# Patient Record
Sex: Female | Born: 1960
Health system: Southern US, Community
[De-identification: ages and names within clinical notes are randomized; demographics above are authoritative.]

## PROBLEM LIST (undated history)

## (undated) DIAGNOSIS — R3915 Urgency of urination: Secondary | ICD-10-CM

## (undated) DIAGNOSIS — K529 Noninfective gastroenteritis and colitis, unspecified: Secondary | ICD-10-CM

## (undated) DIAGNOSIS — J45998 Other asthma: Secondary | ICD-10-CM

## (undated) DIAGNOSIS — K219 Gastro-esophageal reflux disease without esophagitis: Secondary | ICD-10-CM

## (undated) DIAGNOSIS — Z860101 Personal history of adenomatous and serrated colon polyps: Secondary | ICD-10-CM

## (undated) DIAGNOSIS — Z87442 Personal history of urinary calculi: Secondary | ICD-10-CM

## (undated) DIAGNOSIS — R112 Nausea with vomiting, unspecified: Secondary | ICD-10-CM

## (undated) DIAGNOSIS — Z9889 Other specified postprocedural states: Secondary | ICD-10-CM

## (undated) DIAGNOSIS — F329 Major depressive disorder, single episode, unspecified: Secondary | ICD-10-CM

## (undated) DIAGNOSIS — N200 Calculus of kidney: Secondary | ICD-10-CM

## (undated) DIAGNOSIS — F32A Depression, unspecified: Secondary | ICD-10-CM

## (undated) DIAGNOSIS — K8689 Other specified diseases of pancreas: Secondary | ICD-10-CM

## (undated) DIAGNOSIS — Z800 Family history of malignant neoplasm of digestive organs: Secondary | ICD-10-CM

## (undated) DIAGNOSIS — K648 Other hemorrhoids: Secondary | ICD-10-CM

## (undated) DIAGNOSIS — E876 Hypokalemia: Secondary | ICD-10-CM

## (undated) DIAGNOSIS — E785 Hyperlipidemia, unspecified: Secondary | ICD-10-CM

## (undated) DIAGNOSIS — L409 Psoriasis, unspecified: Secondary | ICD-10-CM

## (undated) DIAGNOSIS — M1990 Unspecified osteoarthritis, unspecified site: Secondary | ICD-10-CM

## (undated) DIAGNOSIS — N201 Calculus of ureter: Secondary | ICD-10-CM

## (undated) DIAGNOSIS — E538 Deficiency of other specified B group vitamins: Secondary | ICD-10-CM

## (undated) DIAGNOSIS — A080 Rotaviral enteritis: Secondary | ICD-10-CM

## (undated) DIAGNOSIS — Z8719 Personal history of other diseases of the digestive system: Secondary | ICD-10-CM

## (undated) DIAGNOSIS — D638 Anemia in other chronic diseases classified elsewhere: Secondary | ICD-10-CM

## (undated) DIAGNOSIS — Z86010 Personal history of colonic polyps: Secondary | ICD-10-CM

## (undated) DIAGNOSIS — I10 Essential (primary) hypertension: Secondary | ICD-10-CM

## (undated) DIAGNOSIS — K5090 Crohn's disease, unspecified, without complications: Secondary | ICD-10-CM

## (undated) HISTORY — DX: Deficiency of other specified B group vitamins: E53.8

## (undated) HISTORY — DX: Major depressive disorder, single episode, unspecified: F32.9

## (undated) HISTORY — PX: ANAL FISSURE REPAIR: SHX2312

## (undated) HISTORY — DX: Crohn's disease, unspecified, without complications: K50.90

## (undated) HISTORY — DX: Family history of malignant neoplasm of digestive organs: Z80.0

## (undated) HISTORY — DX: Gastro-esophageal reflux disease without esophagitis: K21.9

## (undated) HISTORY — DX: Psoriasis, unspecified: L40.9

## (undated) HISTORY — DX: Other specified diseases of pancreas: K86.89

## (undated) HISTORY — DX: Depression, unspecified: F32.A

---

## 1990-03-07 HISTORY — PX: VAGINAL HYSTERECTOMY: SUR661

## 1993-01-01 ENCOUNTER — Encounter: Payer: Self-pay | Admitting: Internal Medicine

## 1997-08-20 ENCOUNTER — Emergency Department (HOSPITAL_COMMUNITY): Admission: EM | Admit: 1997-08-20 | Discharge: 1997-08-20 | Payer: Self-pay | Admitting: Emergency Medicine

## 1997-10-31 ENCOUNTER — Ambulatory Visit (HOSPITAL_COMMUNITY): Admission: RE | Admit: 1997-10-31 | Discharge: 1997-11-01 | Payer: Self-pay | Admitting: Neurosurgery

## 1997-11-14 ENCOUNTER — Ambulatory Visit (HOSPITAL_COMMUNITY): Admission: RE | Admit: 1997-11-14 | Discharge: 1997-11-14 | Payer: Self-pay | Admitting: Neurosurgery

## 1997-11-28 ENCOUNTER — Ambulatory Visit (HOSPITAL_COMMUNITY): Admission: RE | Admit: 1997-11-28 | Discharge: 1997-11-28 | Payer: Self-pay | Admitting: Neurosurgery

## 1997-12-19 ENCOUNTER — Ambulatory Visit (HOSPITAL_COMMUNITY): Admission: RE | Admit: 1997-12-19 | Discharge: 1997-12-19 | Payer: Self-pay | Admitting: Neurosurgery

## 1997-12-23 ENCOUNTER — Ambulatory Visit: Admission: RE | Admit: 1997-12-23 | Discharge: 1997-12-23 | Payer: Self-pay | Admitting: Anesthesiology

## 1998-02-11 ENCOUNTER — Ambulatory Visit (HOSPITAL_COMMUNITY): Admission: RE | Admit: 1998-02-11 | Discharge: 1998-02-11 | Payer: Self-pay | Admitting: Neurosurgery

## 1998-03-07 HISTORY — PX: CARPAL TUNNEL RELEASE: SHX101

## 2000-03-07 HISTORY — PX: CHOLECYSTECTOMY: SHX55

## 2000-05-26 ENCOUNTER — Other Ambulatory Visit: Admission: RE | Admit: 2000-05-26 | Discharge: 2000-05-26 | Payer: Self-pay | Admitting: Family Medicine

## 2004-01-27 ENCOUNTER — Ambulatory Visit: Payer: Self-pay | Admitting: Family Medicine

## 2004-05-12 ENCOUNTER — Ambulatory Visit: Payer: Self-pay | Admitting: Family Medicine

## 2004-05-27 ENCOUNTER — Ambulatory Visit: Payer: Self-pay | Admitting: Family Medicine

## 2004-05-31 ENCOUNTER — Ambulatory Visit: Payer: Self-pay | Admitting: Family Medicine

## 2004-06-02 ENCOUNTER — Ambulatory Visit: Payer: Self-pay | Admitting: Family Medicine

## 2004-06-16 ENCOUNTER — Ambulatory Visit: Payer: Self-pay | Admitting: Internal Medicine

## 2004-06-22 ENCOUNTER — Ambulatory Visit: Payer: Self-pay | Admitting: Family Medicine

## 2004-07-01 ENCOUNTER — Ambulatory Visit: Payer: Self-pay | Admitting: Internal Medicine

## 2004-07-06 ENCOUNTER — Ambulatory Visit: Payer: Self-pay | Admitting: Family Medicine

## 2005-05-23 ENCOUNTER — Ambulatory Visit: Payer: Self-pay | Admitting: Family Medicine

## 2007-06-14 ENCOUNTER — Encounter: Payer: Self-pay | Admitting: Internal Medicine

## 2007-08-31 DIAGNOSIS — Z860100 Personal history of colon polyps, unspecified: Secondary | ICD-10-CM | POA: Insufficient documentation

## 2007-08-31 DIAGNOSIS — F3289 Other specified depressive episodes: Secondary | ICD-10-CM | POA: Insufficient documentation

## 2007-08-31 DIAGNOSIS — Z8719 Personal history of other diseases of the digestive system: Secondary | ICD-10-CM

## 2007-08-31 DIAGNOSIS — H9190 Unspecified hearing loss, unspecified ear: Secondary | ICD-10-CM | POA: Insufficient documentation

## 2007-08-31 DIAGNOSIS — K449 Diaphragmatic hernia without obstruction or gangrene: Secondary | ICD-10-CM | POA: Insufficient documentation

## 2007-08-31 DIAGNOSIS — K219 Gastro-esophageal reflux disease without esophagitis: Secondary | ICD-10-CM | POA: Insufficient documentation

## 2007-08-31 DIAGNOSIS — F329 Major depressive disorder, single episode, unspecified: Secondary | ICD-10-CM | POA: Insufficient documentation

## 2007-10-16 ENCOUNTER — Encounter (INDEPENDENT_AMBULATORY_CARE_PROVIDER_SITE_OTHER): Payer: Self-pay | Admitting: *Deleted

## 2007-10-16 ENCOUNTER — Inpatient Hospital Stay (HOSPITAL_COMMUNITY): Admission: EM | Admit: 2007-10-16 | Discharge: 2007-10-20 | Payer: Self-pay | Admitting: Emergency Medicine

## 2007-10-17 ENCOUNTER — Encounter: Payer: Self-pay | Admitting: Internal Medicine

## 2007-10-19 ENCOUNTER — Encounter: Payer: Self-pay | Admitting: Internal Medicine

## 2007-10-20 ENCOUNTER — Encounter: Payer: Self-pay | Admitting: Internal Medicine

## 2007-10-25 ENCOUNTER — Ambulatory Visit: Payer: Self-pay | Admitting: Internal Medicine

## 2007-10-31 ENCOUNTER — Encounter: Payer: Self-pay | Admitting: Internal Medicine

## 2007-11-07 ENCOUNTER — Ambulatory Visit: Payer: Self-pay | Admitting: Internal Medicine

## 2007-11-07 DIAGNOSIS — L408 Other psoriasis: Secondary | ICD-10-CM

## 2007-11-07 DIAGNOSIS — K5000 Crohn's disease of small intestine without complications: Secondary | ICD-10-CM

## 2007-11-08 LAB — CONVERTED CEMR LAB
BUN: 8 mg/dL (ref 6–23)
CO2: 32 meq/L (ref 19–32)
Eosinophils Absolute: 0.1 10*3/uL (ref 0.0–0.7)
Eosinophils Relative: 1 % (ref 0.0–5.0)
HCT: 37.4 % (ref 36.0–46.0)
Lymphocytes Relative: 29.6 % (ref 12.0–46.0)
MCV: 90.1 fL (ref 78.0–100.0)
Monocytes Absolute: 0.7 10*3/uL (ref 0.1–1.0)
Monocytes Relative: 8.4 % (ref 3.0–12.0)
Neutro Abs: 4.8 10*3/uL (ref 1.4–7.7)
Platelets: 214 10*3/uL (ref 150–400)
Potassium: 3.9 meq/L (ref 3.5–5.1)
RBC: 4.15 M/uL (ref 3.87–5.11)
RDW: 12.1 % (ref 11.5–14.6)
Sodium: 142 meq/L (ref 135–145)
Vitamin B-12: 155 pg/mL — ABNORMAL LOW (ref 211–911)
WBC: 8.1 10*3/uL (ref 4.5–10.5)

## 2007-11-09 ENCOUNTER — Encounter: Payer: Self-pay | Admitting: Internal Medicine

## 2007-11-18 ENCOUNTER — Encounter: Payer: Self-pay | Admitting: Internal Medicine

## 2007-11-28 ENCOUNTER — Telehealth: Payer: Self-pay | Admitting: Internal Medicine

## 2007-12-04 ENCOUNTER — Ambulatory Visit: Payer: Self-pay | Admitting: Internal Medicine

## 2007-12-18 ENCOUNTER — Ambulatory Visit: Payer: Self-pay | Admitting: Internal Medicine

## 2008-02-25 ENCOUNTER — Ambulatory Visit: Payer: Self-pay | Admitting: Internal Medicine

## 2008-02-26 LAB — CONVERTED CEMR LAB
Basophils Relative: 0.7 % (ref 0.0–3.0)
Eosinophils Absolute: 0.1 10*3/uL (ref 0.0–0.7)
HCT: 39.8 % (ref 36.0–46.0)
Hemoglobin: 13.8 g/dL (ref 12.0–15.0)
MCV: 89.7 fL (ref 78.0–100.0)
Neutrophils Relative %: 52.1 % (ref 43.0–77.0)
Platelets: 213 10*3/uL (ref 150–400)
RDW: 12.7 % (ref 11.5–14.6)
WBC: 7.9 10*3/uL (ref 4.5–10.5)

## 2008-02-27 ENCOUNTER — Telehealth (INDEPENDENT_AMBULATORY_CARE_PROVIDER_SITE_OTHER): Payer: Self-pay | Admitting: *Deleted

## 2008-06-30 ENCOUNTER — Ambulatory Visit: Payer: Self-pay | Admitting: Internal Medicine

## 2008-07-10 ENCOUNTER — Encounter: Payer: Self-pay | Admitting: Internal Medicine

## 2008-07-15 ENCOUNTER — Telehealth: Payer: Self-pay | Admitting: Internal Medicine

## 2008-07-23 ENCOUNTER — Telehealth: Payer: Self-pay | Admitting: Internal Medicine

## 2008-07-26 ENCOUNTER — Ambulatory Visit: Payer: Self-pay | Admitting: Internal Medicine

## 2008-07-26 ENCOUNTER — Inpatient Hospital Stay (HOSPITAL_COMMUNITY): Admission: EM | Admit: 2008-07-26 | Discharge: 2008-07-31 | Payer: Self-pay | Admitting: Emergency Medicine

## 2008-08-27 ENCOUNTER — Ambulatory Visit: Payer: Self-pay | Admitting: Internal Medicine

## 2008-09-09 ENCOUNTER — Telehealth: Payer: Self-pay | Admitting: Internal Medicine

## 2008-09-11 ENCOUNTER — Ambulatory Visit (HOSPITAL_COMMUNITY): Admission: RE | Admit: 2008-09-11 | Discharge: 2008-09-11 | Payer: Self-pay | Admitting: Internal Medicine

## 2008-09-12 DIAGNOSIS — K56609 Unspecified intestinal obstruction, unspecified as to partial versus complete obstruction: Secondary | ICD-10-CM | POA: Insufficient documentation

## 2008-09-25 ENCOUNTER — Encounter: Payer: Self-pay | Admitting: Internal Medicine

## 2008-09-29 ENCOUNTER — Encounter: Payer: Self-pay | Admitting: Internal Medicine

## 2008-10-09 ENCOUNTER — Inpatient Hospital Stay (HOSPITAL_COMMUNITY): Admission: RE | Admit: 2008-10-09 | Discharge: 2008-10-14 | Payer: Self-pay | Admitting: General Surgery

## 2008-10-09 ENCOUNTER — Encounter (INDEPENDENT_AMBULATORY_CARE_PROVIDER_SITE_OTHER): Payer: Self-pay | Admitting: General Surgery

## 2008-10-09 HISTORY — PX: LAPAROSCOPIC ILEOCECECTOMY: SHX5898

## 2008-10-14 ENCOUNTER — Encounter (INDEPENDENT_AMBULATORY_CARE_PROVIDER_SITE_OTHER): Payer: Self-pay | Admitting: *Deleted

## 2008-10-20 ENCOUNTER — Ambulatory Visit: Payer: Self-pay | Admitting: Internal Medicine

## 2008-11-03 ENCOUNTER — Ambulatory Visit: Payer: Self-pay | Admitting: Internal Medicine

## 2008-11-12 ENCOUNTER — Encounter (INDEPENDENT_AMBULATORY_CARE_PROVIDER_SITE_OTHER): Payer: Self-pay | Admitting: *Deleted

## 2008-12-15 ENCOUNTER — Ambulatory Visit: Payer: Self-pay | Admitting: Internal Medicine

## 2008-12-16 LAB — CONVERTED CEMR LAB
Basophils Absolute: 0.1 10*3/uL (ref 0.0–0.1)
Hemoglobin: 12.9 g/dL (ref 12.0–15.0)
MCV: 97.9 fL (ref 78.0–100.0)
Monocytes Relative: 9 % (ref 3.0–12.0)
Neutro Abs: 3.2 10*3/uL (ref 1.4–7.7)
Saturation Ratios: 32.9 % (ref 20.0–50.0)
Transferrin: 251.7 mg/dL (ref 212.0–360.0)
Vitamin B-12: 185 pg/mL — ABNORMAL LOW (ref 211–911)
WBC: 7.3 10*3/uL (ref 4.5–10.5)

## 2008-12-17 ENCOUNTER — Ambulatory Visit: Payer: Self-pay | Admitting: Internal Medicine

## 2008-12-17 DIAGNOSIS — E538 Deficiency of other specified B group vitamins: Secondary | ICD-10-CM

## 2008-12-18 ENCOUNTER — Encounter: Payer: Self-pay | Admitting: Internal Medicine

## 2008-12-18 ENCOUNTER — Telehealth: Payer: Self-pay | Admitting: Internal Medicine

## 2008-12-24 ENCOUNTER — Ambulatory Visit: Payer: Self-pay | Admitting: Internal Medicine

## 2008-12-31 ENCOUNTER — Ambulatory Visit: Payer: Self-pay | Admitting: Internal Medicine

## 2009-01-08 ENCOUNTER — Ambulatory Visit: Payer: Self-pay | Admitting: Internal Medicine

## 2009-01-19 ENCOUNTER — Telehealth: Payer: Self-pay | Admitting: Internal Medicine

## 2009-01-20 ENCOUNTER — Ambulatory Visit: Payer: Self-pay | Admitting: Internal Medicine

## 2009-01-22 ENCOUNTER — Ambulatory Visit: Payer: Self-pay | Admitting: Internal Medicine

## 2009-02-02 ENCOUNTER — Telehealth: Payer: Self-pay | Admitting: Internal Medicine

## 2009-02-03 ENCOUNTER — Ambulatory Visit: Payer: Self-pay | Admitting: Internal Medicine

## 2009-02-03 ENCOUNTER — Encounter: Payer: Self-pay | Admitting: Physician Assistant

## 2009-02-03 DIAGNOSIS — R1031 Right lower quadrant pain: Secondary | ICD-10-CM

## 2009-02-03 DIAGNOSIS — A09 Infectious gastroenteritis and colitis, unspecified: Secondary | ICD-10-CM | POA: Insufficient documentation

## 2009-02-03 DIAGNOSIS — R197 Diarrhea, unspecified: Secondary | ICD-10-CM | POA: Insufficient documentation

## 2009-02-03 DIAGNOSIS — R1032 Left lower quadrant pain: Secondary | ICD-10-CM | POA: Insufficient documentation

## 2009-02-03 DIAGNOSIS — K5090 Crohn's disease, unspecified, without complications: Secondary | ICD-10-CM | POA: Insufficient documentation

## 2009-02-03 LAB — CONVERTED CEMR LAB
BUN: 6 mg/dL (ref 6–23)
Basophils Absolute: 0.1 10*3/uL (ref 0.0–0.1)
Basophils Relative: 1 % (ref 0.0–3.0)
CO2: 29 meq/L (ref 19–32)
Calcium: 9.5 mg/dL (ref 8.4–10.5)
Creatinine, Ser: 0.8 mg/dL (ref 0.4–1.2)
Eosinophils Relative: 0.8 % (ref 0.0–5.0)
Glucose, Bld: 105 mg/dL — ABNORMAL HIGH (ref 70–99)
HCT: 40.7 % (ref 36.0–46.0)
Lymphs Abs: 3 10*3/uL (ref 0.7–4.0)
MCHC: 34.1 g/dL (ref 30.0–36.0)
MCV: 95.7 fL (ref 78.0–100.0)
Monocytes Relative: 10.7 % (ref 3.0–12.0)
Neutro Abs: 3.3 10*3/uL (ref 1.4–7.7)
Platelets: 199 10*3/uL (ref 150.0–400.0)
RBC: 4.26 M/uL (ref 3.87–5.11)
RDW: 12 % (ref 11.5–14.6)

## 2009-02-05 ENCOUNTER — Encounter: Payer: Self-pay | Admitting: Physician Assistant

## 2009-02-06 ENCOUNTER — Telehealth: Payer: Self-pay | Admitting: Physician Assistant

## 2009-02-09 ENCOUNTER — Ambulatory Visit: Payer: Self-pay | Admitting: Physician Assistant

## 2009-02-26 ENCOUNTER — Ambulatory Visit: Payer: Self-pay | Admitting: Internal Medicine

## 2009-03-09 ENCOUNTER — Encounter: Payer: Self-pay | Admitting: Internal Medicine

## 2009-03-12 ENCOUNTER — Ambulatory Visit: Payer: Self-pay | Admitting: Internal Medicine

## 2009-03-30 ENCOUNTER — Ambulatory Visit: Payer: Self-pay | Admitting: Internal Medicine

## 2009-03-30 ENCOUNTER — Telehealth: Payer: Self-pay | Admitting: Internal Medicine

## 2009-03-30 LAB — CONVERTED CEMR LAB
AST: 17 units/L (ref 0–37)
BUN: 6 mg/dL (ref 6–23)
Basophils Absolute: 0.1 10*3/uL (ref 0.0–0.1)
Basophils Relative: 1.1 % (ref 0.0–3.0)
Calcium: 9 mg/dL (ref 8.4–10.5)
Creatinine, Ser: 0.8 mg/dL (ref 0.4–1.2)
Glucose, Bld: 98 mg/dL (ref 70–99)
Hemoglobin: 13 g/dL (ref 12.0–15.0)
Lymphocytes Relative: 43.4 % (ref 12.0–46.0)
Monocytes Absolute: 0.5 10*3/uL (ref 0.1–1.0)
Neutrophils Relative %: 46.5 % (ref 43.0–77.0)
RBC: 4.08 M/uL (ref 3.87–5.11)
RDW: 12.3 % (ref 11.5–14.6)
Total Bilirubin: 0.6 mg/dL (ref 0.3–1.2)
Total Protein: 7 g/dL (ref 6.0–8.3)
Vitamin B-12: 366 pg/mL (ref 211–911)

## 2009-04-16 ENCOUNTER — Encounter: Payer: Self-pay | Admitting: Internal Medicine

## 2009-04-16 ENCOUNTER — Ambulatory Visit: Payer: Self-pay | Admitting: Internal Medicine

## 2009-04-29 ENCOUNTER — Ambulatory Visit: Payer: Self-pay | Admitting: Internal Medicine

## 2009-05-18 ENCOUNTER — Ambulatory Visit: Payer: Self-pay | Admitting: Internal Medicine

## 2009-05-21 ENCOUNTER — Ambulatory Visit: Payer: Self-pay | Admitting: Internal Medicine

## 2009-05-25 ENCOUNTER — Encounter: Payer: Self-pay | Admitting: Internal Medicine

## 2009-05-28 ENCOUNTER — Ambulatory Visit: Payer: Self-pay | Admitting: Gastroenterology

## 2009-06-24 ENCOUNTER — Telehealth: Payer: Self-pay | Admitting: Internal Medicine

## 2009-06-29 ENCOUNTER — Ambulatory Visit: Payer: Self-pay | Admitting: Internal Medicine

## 2009-07-06 ENCOUNTER — Encounter: Payer: Self-pay | Admitting: Internal Medicine

## 2009-08-04 ENCOUNTER — Ambulatory Visit: Payer: Self-pay | Admitting: Internal Medicine

## 2009-08-19 ENCOUNTER — Telehealth: Payer: Self-pay | Admitting: Internal Medicine

## 2009-08-20 ENCOUNTER — Telehealth: Payer: Self-pay | Admitting: Internal Medicine

## 2009-08-20 ENCOUNTER — Encounter (INDEPENDENT_AMBULATORY_CARE_PROVIDER_SITE_OTHER): Payer: Self-pay | Admitting: *Deleted

## 2009-08-20 ENCOUNTER — Inpatient Hospital Stay (HOSPITAL_COMMUNITY): Admission: EM | Admit: 2009-08-20 | Discharge: 2009-08-24 | Payer: Self-pay | Admitting: Emergency Medicine

## 2009-08-21 ENCOUNTER — Ambulatory Visit: Payer: Self-pay | Admitting: Internal Medicine

## 2009-09-01 ENCOUNTER — Ambulatory Visit: Payer: Self-pay | Admitting: Internal Medicine

## 2009-09-08 ENCOUNTER — Ambulatory Visit: Payer: Self-pay | Admitting: Internal Medicine

## 2009-09-08 LAB — CONVERTED CEMR LAB
Albumin: 3.9 g/dL (ref 3.5–5.2)
Alkaline Phosphatase: 44 units/L (ref 39–117)
CO2: 32 meq/L (ref 19–32)
Chloride: 105 meq/L (ref 96–112)
Eosinophils Relative: 0.5 % (ref 0.0–5.0)
GFR calc non Af Amer: 106.24 mL/min (ref >60)
Glucose, Bld: 80 mg/dL (ref 70–99)
HCT: 40.8 % (ref 36.0–46.0)
Iron: 142 ug/dL (ref 42–145)
Lymphocytes Relative: 39.2 % (ref 12.0–46.0)
MCHC: 33.5 g/dL (ref 30.0–36.0)
MCV: 100.5 fL — ABNORMAL HIGH (ref 78.0–100.0)
RBC: 4.06 M/uL (ref 3.87–5.11)
Saturation Ratios: 35 % (ref 20.0–50.0)
Total Protein: 6.6 g/dL (ref 6.0–8.3)
WBC: 12.2 10*3/uL — ABNORMAL HIGH (ref 4.5–10.5)

## 2009-09-09 ENCOUNTER — Telehealth: Payer: Self-pay | Admitting: Internal Medicine

## 2009-09-11 ENCOUNTER — Encounter (HOSPITAL_COMMUNITY): Admission: RE | Admit: 2009-09-11 | Discharge: 2009-12-10 | Payer: Self-pay | Admitting: Internal Medicine

## 2009-09-11 ENCOUNTER — Encounter: Payer: Self-pay | Admitting: Internal Medicine

## 2009-09-25 ENCOUNTER — Encounter: Payer: Self-pay | Admitting: Internal Medicine

## 2009-09-30 ENCOUNTER — Ambulatory Visit: Payer: Self-pay | Admitting: Internal Medicine

## 2009-10-19 ENCOUNTER — Encounter: Payer: Self-pay | Admitting: Gastroenterology

## 2009-10-19 ENCOUNTER — Ambulatory Visit: Payer: Self-pay | Admitting: Internal Medicine

## 2009-10-21 ENCOUNTER — Telehealth (INDEPENDENT_AMBULATORY_CARE_PROVIDER_SITE_OTHER): Payer: Self-pay

## 2009-10-23 ENCOUNTER — Ambulatory Visit: Payer: Self-pay | Admitting: Internal Medicine

## 2009-10-26 ENCOUNTER — Encounter: Payer: Self-pay | Admitting: Internal Medicine

## 2009-10-28 ENCOUNTER — Telehealth: Payer: Self-pay | Admitting: Internal Medicine

## 2009-10-29 ENCOUNTER — Encounter: Payer: Self-pay | Admitting: Internal Medicine

## 2009-11-05 ENCOUNTER — Telehealth (INDEPENDENT_AMBULATORY_CARE_PROVIDER_SITE_OTHER): Payer: Self-pay | Admitting: *Deleted

## 2009-12-16 ENCOUNTER — Encounter: Payer: Self-pay | Admitting: Internal Medicine

## 2009-12-18 ENCOUNTER — Encounter (HOSPITAL_COMMUNITY)
Admission: RE | Admit: 2009-12-18 | Discharge: 2010-03-18 | Payer: Self-pay | Source: Home / Self Care | Attending: Internal Medicine | Admitting: Internal Medicine

## 2009-12-18 ENCOUNTER — Encounter: Payer: Self-pay | Admitting: Internal Medicine

## 2009-12-23 ENCOUNTER — Ambulatory Visit: Payer: Self-pay | Admitting: Internal Medicine

## 2009-12-23 LAB — CONVERTED CEMR LAB
BUN: 13 mg/dL (ref 6–23)
Creatinine, Ser: 0.8 mg/dL (ref 0.4–1.2)
Eosinophils Absolute: 0 10*3/uL (ref 0.0–0.7)
Eosinophils Relative: 0.2 % (ref 0.0–5.0)
Glucose, Bld: 82 mg/dL (ref 70–99)
HCT: 37.7 % (ref 36.0–46.0)
Hemoglobin: 13.1 g/dL (ref 12.0–15.0)
MCV: 98 fL (ref 78.0–100.0)
Magnesium: 2 mg/dL (ref 1.5–2.5)
Monocytes Relative: 7.1 % (ref 3.0–12.0)
Neutrophils Relative %: 70.5 % (ref 43.0–77.0)
Phosphorus: 4.2 mg/dL (ref 2.3–4.6)
Platelets: 195 10*3/uL (ref 150.0–400.0)
Potassium: 4.7 meq/L (ref 3.5–5.1)
RBC: 3.84 M/uL — ABNORMAL LOW (ref 3.87–5.11)
RDW: 13.5 % (ref 11.5–14.6)
WBC: 9.9 10*3/uL (ref 4.5–10.5)

## 2010-01-22 ENCOUNTER — Telehealth: Payer: Self-pay | Admitting: Internal Medicine

## 2010-01-25 ENCOUNTER — Ambulatory Visit: Payer: Self-pay | Admitting: Internal Medicine

## 2010-02-12 ENCOUNTER — Encounter: Payer: Self-pay | Admitting: Internal Medicine

## 2010-02-17 ENCOUNTER — Telehealth (INDEPENDENT_AMBULATORY_CARE_PROVIDER_SITE_OTHER): Payer: Self-pay | Admitting: *Deleted

## 2010-02-18 ENCOUNTER — Ambulatory Visit: Payer: Self-pay | Admitting: Internal Medicine

## 2010-02-25 ENCOUNTER — Ambulatory Visit: Payer: Self-pay | Admitting: Pulmonary Disease

## 2010-03-04 ENCOUNTER — Telehealth: Payer: Self-pay | Admitting: Internal Medicine

## 2010-03-22 ENCOUNTER — Ambulatory Visit
Admission: RE | Admit: 2010-03-22 | Discharge: 2010-03-22 | Payer: Self-pay | Source: Home / Self Care | Attending: Internal Medicine | Admitting: Internal Medicine

## 2010-04-02 ENCOUNTER — Encounter: Payer: Self-pay | Admitting: Pulmonary Disease

## 2010-04-02 ENCOUNTER — Ambulatory Visit
Admission: RE | Admit: 2010-04-02 | Discharge: 2010-04-02 | Payer: Self-pay | Source: Home / Self Care | Attending: Pulmonary Disease | Admitting: Pulmonary Disease

## 2010-04-02 DIAGNOSIS — R05 Cough: Secondary | ICD-10-CM | POA: Insufficient documentation

## 2010-04-04 LAB — CONVERTED CEMR LAB
Basophils Relative: 0.8 % (ref 0.0–3.0)
Eosinophils Absolute: 0 10*3/uL (ref 0.0–0.7)
Eosinophils Relative: 0.6 % (ref 0.0–5.0)
HCT: 37 % (ref 36.0–46.0)
Hemoglobin: 13.3 g/dL (ref 12.0–15.0)
Lymphs Abs: 2.5 10*3/uL (ref 0.7–4.0)
MCV: 89.4 fL (ref 78.0–100.0)
Neutro Abs: 5 10*3/uL (ref 1.4–7.7)
RBC: 4.13 M/uL (ref 3.87–5.11)
Sed Rate: 27 mm/hr — ABNORMAL HIGH (ref 0–22)

## 2010-04-05 ENCOUNTER — Ambulatory Visit: Admit: 2010-04-05 | Payer: Self-pay | Admitting: Pulmonary Disease

## 2010-04-08 NOTE — Medication Information (Signed)
Summary: [employer]  [employer]   Imported By: Bubba Hales 07/08/2009 11:31:09  _____________________________________________________________________  External Attachment:    Type:   Image     Comment:   External Document

## 2010-04-08 NOTE — Progress Notes (Signed)
Summary: Has a raw mouth  Phone Note Call from Patient Call back at [phone]   Call For: Dr Olevia Perches Reason for Call: Talk to Nurse Summary of Call: Has a really raw mouth. Wonders if she can get something called in for it? Uses [facility] in [place] Initial call taken by: Irwin Brakeman [facility],  January 22, 2010 3:48 PM  Follow-up for Phone Call        Patient called to report a "raw mouth." She states she has thrush  in her mouth. Cannot wear her lower dentures. States it is like it was when she came in on 12/23/09. The Diflucan she took then helped but it is back. Spoke with Tye Savoy, RNP re: this. Rx sent for Diflucan 100 mg by mouth daily x 6 days. Patient  notified. Follow-up by: Leone Payor RN,  January 22, 2010 4:10 PM    New/Updated Medications: FLUCONAZOLE 100 MG TABS (FLUCONAZOLE) Take one by mouth daily for 6 days Prescriptions: FLUCONAZOLE 100 MG TABS (FLUCONAZOLE) Take one by mouth daily for 6 days  #6 x 0   Entered by:   Leone Payor RN   Authorized by:   Tye Savoy NP   Signed by:   Leone Payor RN on 01/22/2010   Method used:   Electronically to        [facility].* (retail)       [address]       [place], [place]  [address]       Ph: [phone] or [phone]       Fax: [phone]   RxID:   [id number]

## 2010-04-08 NOTE — Assessment & Plan Note (Addendum)
Summary: asthma/jd   Visit Type:  Initial Consult Copy to:  n/a Primary Provider/Referring Provider:  Gerrit Heck, MD  CC:  Pulmonary consult.  History of Present Illness: 50/F, non smoker with crohn's  for evaluation of 'asthma' asthma since teens, mild intermittent, not bothered x 10 yrs Caught a cold in july & could not get rid of it Cough, most prominent symptom, clear phlegm in spells x 1 hr  Hoarse in evenings, wheezing at night & morning She has been on benazepril x 3 months h/o crohn's since 2009, down to 15 mg prednisone now, started remicaide (in place of humira) in aug' 11 Spirometry showed mild airway obstruction with FEV1 81 , ratio 71 She is on omeprazole for GERD, reports mild post nasal drip.  Preventive Screening-Counseling & Management  Alcohol-Tobacco     Alcohol drinks/day: occ     Alcohol type: wine     Smoking Status: quit     Packs/Day: 0.25     Year Started: 1975     Year Quit: 1984  Current Medications (verified): 1)  Cyanocobalamin 1000 Mcg/ml Soln (Cyanocobalamin) .... Inject 1 Ml Intramuscular Once Monthly 2)  Zofran 4 Mg Tabs (Ondansetron Hcl) .... Take 1 Tablet By Mouth Every 6 Hours As Needed For Nausea. 3)  Omeprazole 20 Mg Tbec (Omeprazole) .... Take 1 Tablet By Mouth Once A Day 4)  Azathioprine 50 Mg Tabs (Azathioprine) .... Take 3 Tablets (150 Mg) Once Daily 5)  Promethazine Hcl 25 Mg Tabs (Promethazine Hcl) .... Take 1 Tablet By Mouth Every 4-6 Hours As Needed For Nausea 6)  Bentyl 20 Mg Tabs (Dicyclomine Hcl) .... Take 1 Tablet By Mouth Two Times A Day 7)  Lomotil 2.5-0.025 Mg Tabs (Diphenoxylate-Atropine) .... Take 1 Tablet By Mouth Three Times A Day 8)  Prednisone 10 Mg Tabs (Prednisone) .Marland KitchenMarland KitchenMarland Kitchen 6m Daily 9)  Analpram-Hc 1-2.5 % Crea (Hydrocortisone Ace-Pramoxine) .... Apply To Rectum 2 Times Daily (May Give Generic If Cheaper) 10)  Remicade 100 Mg Solr (Infliximab) .... 5 Mg/kg Every 7 Weeks 11)  Lunesta 2 Mg Tabs (Eszopiclone) ....  As Needed At Bedtime 12)  Benazepril Hcl 20 Mg Tabs (Benazepril Hcl) .... One Tablet By Mouth Once Daily 13)  Symbicort 80-4.5 Mcg/act Aero (Budesonide-Formoterol Fumarate) .... 2 Puffs Twice Daily 14)  Fluticasone Propionate 50 Mcg/act Susp (Fluticasone Propionate) .... Two Times A Day 15)  Proair Hfa 108 (90 Base) Mcg/act Aers (Albuterol Sulfate) .... 2 Puffs As Needed 16)  Nystatin 100000 Unit/ml Susp (Nystatin) .... Swish and Spit Four Times A Day 17)  Flexeril 10 Mg Tabs (Cyclobenzaprine Hcl) .... Take One By Mouth Q Hs As Needed Cramps 18)  Diflucan 100 Mg Tabs (Fluconazole) .... Take 1 Tablet Once Per Week X 12 Weeks 19)  Spiriva Handihaler 18 Mcg Caps (Tiotropium Bromide Monohydrate) .... Once Daily 20)  Diovan 320 Mg Tabs (Valsartan) .... Take 1 Tablet By Mouth Once A Day  Allergies (verified): 1)  ! Codeine  Past History:  Past Medical History: Last updated: 02/03/2009 Current Problems: CROHNS DISEASE  DEPRESSION (ICD-311) HEARING LOSS (ICD-389.9) HEMORRHOIDS, HX OF (ICD-V12.79) HIATAL HERNIA (ICD-553.3) COLONIC POLYPS, ADENOMATOUS, HX OF (ICD-V12.72) GERD (ICD-530.81)  Family History: Last updated: 11/07/2007 Family History of Colon Cancer: Mother Family History of Breast Cancer:Grandmother Family History of Colitis/Crohn's:Aunts Family History of Colon Polyps::Mother, Father , Brother  Social History: Last updated: 04/02/2010 Patient is a former smoker.-stopped around age 50 Alcohol Use - yes-rare Occupation: Cook Daily Caffeine Use -1 Patient does not get  regular exercise.  Married to Luiz Ochoa  Past Surgical History: Partial Hysterectomy S/P ILEOCECECTOMY 10/2008 Right carpal tunnel release Annal Fissure repair Cholecystectomy Appendectomy  Social History: Patient is a former smoker.-stopped around age 50  Alcohol Use - yes-rare Occupation: Cook Daily Caffeine Use -1 Patient does not get regular exercise.  Married to [employer] Packs/Day:  0.25 Alcohol drinks/day:  occ  Review of Systems       The patient complains of shortness of breath with activity, productive cough, acid heartburn, indigestion, abdominal pain, headaches, sneezing, ear ache, and joint stiffness or pain.  The patient denies shortness of breath at rest, non-productive cough, coughing up blood, chest pain, irregular heartbeats, loss of appetite, weight change, difficulty swallowing, sore throat, tooth/dental problems, nasal congestion/difficulty breathing through nose, itching, anxiety, depression, hand/feet swelling, rash, change in color of mucus, and fever.    Vital Signs:  Patient profile:   50 year old female Height:      68 inches Weight:      222.4 pounds BMI:     33.94 O2 Sat:      97 % on Room air Temp:     98.3 degrees F oral Pulse rate:   92 / minute BP sitting:   142 / 86  (left arm) Cuff size:   large  Vitals Entered By: Iran Planas CMA (April 02, 2010 4:12 PM)  O2 Flow:  Room air CC: Pulmonary consult Comments Medications reviewed with patient Verified contact number and pharmacy with patient Iran Planas CMA  April 02, 2010 4:12 PM    Physical Exam  Additional Exam:  Gen. Pleasant, obese, in no distress, normal affect ENT - no lesions, no post nasal drip, steroid facies Neck: No JVD, no thyromegaly, no carotid bruits Lungs: no use of accessory muscles, no dullness to percussion, clear without rales or rhonchi  Cardiovascular: Rhythm regular, heart sounds  normal, no murmurs or gallops, no peripheral edema Abdomen: soft and non-tender, no hepatosplenomegaly, BS normal. Musculoskeletal: No deformities, no cyanosis or clubbing Neuro:  alert, non focal     Impression & Recommendations:  Problem # 1:  COUGH (ICD-786.2) DD includes ACE inhibitor, post nasal drip, GERD & cough varinat asthma  Asthma sees unlikely since oral steroids should have been effective STOP benazepril , use amlodipine 10  mg instead STOP spiriva Use tessalon perles three times a day  Use delsym cough syrup as needed  CXR to ensure no fibrotic changes fro crohn's Orders: T-2 View CXR (71020TC) Consultation Level IV (83419)  Problem # 2:  GERD (ICD-530.81) Assessment: Comment Only  Her updated medication list for this problem includes:    Omeprazole 20 Mg Tbec (Omeprazole) .Marland Kitchen... Take 1 tablet by mouth once a day    Bentyl 20 Mg Tabs (Dicyclomine hcl) .Marland Kitchen... Take 1 tablet by mouth two times a day  Medications Added to Medication List This Visit: 1)  Prednisone 10 Mg Tabs (Prednisone) .Marland KitchenMarland KitchenMarland Kitchen 27m daily 2)  Spiriva Handihaler 18 Mcg Caps (Tiotropium bromide monohydrate) .... Once daily 3)  Diovan 320 Mg Tabs (Valsartan) .... Take 1 tablet by mouth once a day 4)  Amlodipine Besylate 10 Mg Tabs (Amlodipine besylate) .... Once daily 5)  Benzonatate 200 Mg Caps (Benzonatate) .... Three times a day  Patient Instructions: 1)  Copy sent to: Dr sRocky Link dr bOlevia Perches2)  Please schedule a follow-up appointment in 3 weeks. 3)  STOP benazepril , use amlodipine 10 mg instead 4)  STOP spiriva 5)  A chest  x-ray has been recommended.  Your imaging study may require preauthorization.  6)  Use tessalon perles three times a day  7)  Use delsym cough syrup as needed  Prescriptions: BENZONATATE 200 MG CAPS (BENZONATATE) three times a day  #60 x 1   Entered and Authorized by:   Leanna Sato. Elsworth Soho MD   Signed by:   Leanna Sato Elsworth Soho MD on 04/02/2010   Method used:   Electronically to        [facility].* (retail)       [address]       [place], [place]  [address]       Ph: [phone] or [phone]       Fax: [phone]   RxID:   [id number] AMLODIPINE BESYLATE 10 MG TABS (AMLODIPINE BESYLATE) once daily  #21 x 0   Entered and Authorized by:   Leanna Sato. Elsworth Soho MD   Signed by:   Leanna Sato Elsworth Soho MD on 04/02/2010   Method used:   Electronically to        [facility] (retail)       [address]       [place], [place]  [address]       Ph: [phone] or [phone]       Fax: [phone]   RxID:   [id number]    Immunization History:  Influenza Immunization History:    Influenza:  historical (12/07/2009)  Pneumovax Immunization History:    Pneumovax:  historical (03/10/2008)

## 2010-04-08 NOTE — Assessment & Plan Note (Signed)
Summary: B12 SHOT..AM  Nurse Visit   Allergies: 1)  ! Codeine  Medication Administration  Injection # 1:    Medication: Vit B12 1000 mcg    Diagnosis: VITAMIN B12 DEFICIENCY (ICD-266.2)    Route: IM    Site: L deltoid    Exp Date: 12/12    Lot #: [id number]    Mfr: American Regent    Patient tolerated injection without complications    Given by: Randye Lobo NCMA (Aug 04, 2009 3:09 PM)  Orders Added: 1)  Vit B12 1000 mcg [D2897]

## 2010-04-08 NOTE — Assessment & Plan Note (Signed)
Summary: PPD for remicade/lk  Nurse Visit   Vital Signs:  Patient profile:   50 year old female BP sitting:   188 / 82  (right arm) Cuff size:   regular  Allergies: 1)  ! Codeine  Immunizations Administered:  PPD Skin Test:    Vaccine Type: PPD    Site: right forearm    Mfr: Sanofi Pasteur    Dose: 0.1 ml    Route: ID    Given by: Marlon Pel CMA ([place])    Exp. Date: 01/07/2011    Lot #: [id number]  PPD Results    Date of reading: 10/26/2009    Results: < 14m    Interpretation: negative  Orders Added: 1)  TB Skin Test [86580] 2)  Admin 1st Vaccine [90471]   I was contacted from short stay following the patient's remicade infusion her BP was 191/99, they wanted to know what they needed to do for her BP.  I advised them to send her here for TB skin test and we will recheck her BP here and have Dr BOlevia Perchesadvise at that time.   Patient 's BP upon arrival was 188/82, per Dr BOlevia Perchespatient to take an extra dose of her benzapril today and then check her BP in the am if less then 1888systolic she is to take her usual dose, recheck her BP miday and if still greater than 160 take a second pill.  She is also advised she needs to follow up with her primary care MD regarding BP.  patient and husband verbalized understanding of instructions. SBarb MerinoRN, CSur[facility] October 23, 2009 2:17 PM

## 2010-04-08 NOTE — Assessment & Plan Note (Signed)
Summary: MONTHLY VB12 [facility]  Nurse Visit   Allergies: 1)  ! Codeine  Medication Administration  Injection # 1:    Medication: Vit B12 1000 mcg    Diagnosis: VITAMIN B12 DEFICIENCY (ICD-266.2)    Route: IM    Site: R deltoid    Exp Date: 01/06/2011    Lot #: [id number]    Mfr: American Regent    Patient tolerated injection without complications    Given by: Marlon Pel CMA Deborra Medina) (April 29, 2009 2:32 PM)  Orders Added: 1)  Vit B12 1000 mcg [E6950]

## 2010-04-08 NOTE — Procedures (Signed)
Summary: Capsule Endoscopy / [facility]  Capsule Endoscopy / [facility]   Imported By: Rise Patience 05/08/2009 16:32:15  _____________________________________________________________________  External Attachment:    Type:   Image     Comment:   External Document

## 2010-04-08 NOTE — Assessment & Plan Note (Signed)
Summary: MONTHLY B12 SHOT...LSW.  Nurse Visit   Vitals Entered By: Randye Lobo NCMA (September 01, 2009 8:56 AM)  Allergies: 1)  ! Codeine  Medication Administration  Injection # 1:    Medication: Vit B12 1000 mcg    Diagnosis: VITAMIN B12 DEFICIENCY (ICD-266.2)    Route: IM    Site: L deltoid    Exp Date: 2/13    Lot #: [id number]    Mfr: American Regent    Patient tolerated injection without complications    Given by: Randye Lobo NCMA (September 01, 2009 8:57 AM)  Orders Added: 1)  Vit B12 1000 mcg [N7182]

## 2010-04-08 NOTE — Progress Notes (Signed)
Summary: Schedule PPD skin test  Phone Note Outgoing Call Call back at [facility] Phone [phone]   Call placed by: Barb Merino RN, CGRN,  October 21, 2009 10:10 AM Call placed to: Patient Summary of Call: Left message for patient to call back to schedule PPD skin test for September Initial call taken by: Barb Merino RN, CGRN,  October 21, 2009 10:11 AM  Follow-up for Phone Call        Left message for patient to call back [place], [facility]  October 22, 2009 10:00 AM  Per Mearl Latin, patient will come today for PPD placement. Follow-up by: Madlyn Frankel CMA (AAMA),  October 23, 2009 11:12 AM

## 2010-04-08 NOTE — Assessment & Plan Note (Signed)
Summary: F/U nausea, diarrhea, pain saw PA   History of Present Illness Visit Type: Follow-up Visit Primary GI MD: [name] MD Primary Provider: Gerrit Heck, MD Requesting Provider: n/a Chief Complaint: Patient still complains of generalized abdominal pain that is not constant and it is worse when she eats. She states that she is having about 9-15 BM per day, but denies any blood in the stool. She compains of alot of nasuea associated with the diarrhea. Patient needs refills on several medications and tells me she needs some labs drawn today as well as a B12 injection. History of Present Illness:   This is a 50 year old white female with Crohn's disease of the small intestine diagnosed on a small bowel capsule endoscopy in August 2009. She is status post terminal ileum resection and ileocolic anastomoses in August 2010. She has an exacerbation of diarrhea, having 9-15 bowel movements a day and at night along with crampy abdominal pain. There has been no vomiting but she had some nausea and increased reflux. She was unable to tolerate Questran. Her medications include Imuran 100 mg daily, Humira 40 mg and every 2 weeks. She has tapered off her prednisone. Her labs from October 2010 were all normal except for a B12 of 185. Her last colonoscopy in April 2009 was normal. Her last upper endoscopy in 1994 showed a hiatal hernia and esophagitis. Despite her diarrhea, she has not lost any weight.   GI Review of Systems    Reports abdominal pain and  nausea.     Location of  Abdominal pain: generalized.    Denies acid reflux, belching, bloating, chest pain, dysphagia with liquids, dysphagia with solids, heartburn, loss of appetite, vomiting, vomiting blood, weight loss, and  weight gain.      Reports diarrhea.     Denies anal fissure, black tarry stools, change in bowel habit, constipation, diverticulosis, fecal incontinence, heme positive stool, hemorrhoids, irritable bowel syndrome, jaundice,  light color stool, liver problems, rectal bleeding, and  rectal pain.    Current Medications (verified): 1)  Cyanocobalamin 1000 Mcg/ml Soln (Cyanocobalamin) .... Inject 1 Ml Intramuscular Twice Monthly 2)  Humira Pen-Psoriasis Starter 40 Mg/0.9m Kit (Adalimumab) .... 432mHumira Every Other Week 3)  Analpram-Hc 1-2.5 % Crea (Hydrocortisone Ace-Pramoxine) .... Apply Two Times A Day To Three Times A Day As Needed 4)  Zofran 4 Mg Tabs (Ondansetron Hcl) .... Take 1 Tablet By Mouth Every 6 Hours As Needed For Nausea. 5)  Omeprazole 20 Mg Tbec (Omeprazole) .... Take 1 Tablet By Mouth Once A Day Need Office Visit For Further Refills! 6)  Azathioprine 50 Mg Tabs (Azathioprine) .... Take 2  Tablets (10082mOnce Daily 7)  Promethazine Hcl 25 Mg Tabs (Promethazine Hcl) .... As Needed 8)  Bentyl 10 Mg Caps (Dicyclomine Hcl) .... Take 1 Tab 3-4 Times Daily As Needed For Cramping, Spasms  Allergies (verified): 1)  ! Codeine  Past History:  Past Medical History: Last updated: 02/03/2009 Current Problems: CROHNS DISEASE  DEPRESSION (ICD-311) HEARING LOSS (ICD-389.9) HEMORRHOIDS, HX OF (ICD-V12.79) HIATAL HERNIA (ICD-553.3) COLONIC POLYPS, ADENOMATOUS, HX OF (ICD-V12.72) GERD (ICD-530.81)  Past Surgical History: Last updated: 02/03/2009 Partial Hysterectomy S/P ILEOCECECTOMY 10/2008 Right carpal tunnel release Annal Fissure repair Cholecystectomy  Family History: Last updated: 11/07/2007 Family History of Colon Cancer: Mother Family History of Breast Cancer:Grandmother Family History of Colitis/Crohn's:Aunts Family History of Colon Polyps::Mother, Father , Brother  Social History: Last updated: 11/07/2007 Patient is a former smoker.  Alcohol Use - yes-on occasion Occupation: [employer]  Caffeine Use -1 Patient does not get regular exercise.   Review of Systems       The patient complains of back pain, cough, fatigue, fever, headaches-new, sleeping problems, and thirst - excessive.   The patient denies allergy/sinus, anemia, anxiety-new, arthritis/joint pain, blood in urine, breast changes/lumps, change in vision, confusion, coughing up blood, depression-new, fainting, hearing problems, heart murmur, heart rhythm changes, itching, menstrual pain, muscle pains/cramps, night sweats, nosebleeds, pregnancy symptoms, shortness of breath, skin rash, sore throat, swelling of feet/legs, swollen lymph glands, thirst - excessive , urination - excessive , urination changes/pain, urine leakage, vision changes, and voice change.         Pertinent positive and negative review of systems were noted in the above HPI. All other ROS was otherwise negative.   Vital Signs:  Patient profile:   50 year old female Height:      68 inches Weight:      198.8 pounds BMI:     30.34 Pulse rate:   76 / minute Pulse rhythm:   regular BP sitting:   142 / 84  (left arm) Cuff size:   regular  Vitals Entered By: Bernita Buffy CMA Deborra Medina) (March 30, 2009 9:47 AM)  Physical Exam  General:  chronically ill appearing, alert and oriented. Eyes:  nonicteric. Mouth:  no aphthous ulcers. Neck:  Supple; no masses or thyromegaly. Lungs:  Clear throughout to auscultation. Heart:  Regular rate and rhythm; no murmurs, rubs,  or bruits. Abdomen:  soft abdomen with tenderness in the umbilicus. Left lower quadrant and right lower quadrants are unremarkable. Bowel sounds are decreased. There is no distention. Rectal:  raw area above the rectal sphincter, macerated skin, normal rectal sphincter, stool is Hemoccult negative. Extremities:  No clubbing, cyanosis, edema or deformities noted. Skin:  psoriasis from the anus and questionably around the rectum. Psych:  Alert and cooperative. Normal mood and affect.   Impression & Recommendations:  Problem # 1:  FAMILY HX COLON CANCER (ICD-V16.0) Patient's last colonoscopy April 2009. Her recall colonoscopy will be due in April 2014.  Problem # 2:  DIARRHEA  (ICD-787.91) Patient's diarrhea is likely from recurrent small bowel Crohn's disease and possibly due to bile salt overflow from cholecystectomy.  Problem # 3:  CROHN'S DISEASE (ICD-555.9) Patient has Crohn's disease of the small bowel. We will proceed with a repeat small bowel capsule endoscopy to see the extent and activity of her Crohn's. At the same time, we will increase Imuran 150 mg a day and consider increasing her Humira to 80 mg every 2 weeks. She needs to stay on a low residue diet and add Lomotil one tablet 3 times a day.  Other Orders: Capsule Endoscopy (Capsule Endoscopy) Vit B12 1000 mcg (J3420) TLB-CBC Platelet - w/Differential (85025-CBCD) TLB-CMP (Comprehensive Metabolic Pnl) (67893-YBOF) TLB-B12 + Folate Pnl (75102_58527-P82/UMP) TLB-IBC Pnl (Iron/FE;Transferrin) (83550-IBC) TLB-Sedimentation Rate (ESR) (85652-ESR)  Patient Instructions: 1)  labs today include CBC, metabolic panel, sedimentation rate, N36, iron, TIBC, folic acid. 2)  Refill Analpram cream 1%, 3)   Zofran 4 mg, refill 4)  Refill Prilosec 20 mg daily. 5)  Small bowel capsule endoscopy. 6)  Low residue diet. 7)  Increase Imuran 150 mg daily. 8)  Copy sent to : Dr Harriett Sine Prescriptions: OMEPRAZOLE 20 MG TBEC (OMEPRAZOLE) Take 1 tablet by mouth once a day  #30 x 3   Entered by:   Awilda Bill CMA ([place])   Authorized by:   Lafayette Dragon MD   Signed by:  Dottie Nelson CMA ([place]) on 03/30/2009   Method used:   Electronically to        [facility].* (retail)       [address]       [place], [place]  [address]       Ph: [phone] or [phone]       Fax: [phone]   RxID:   [id number] ZOFRAN 4 MG TABS (ONDANSETRON HCL) Take 1 tablet by mouth every 6 hours as needed for nausea.  #12 x 1   Entered by:   Awilda Bill CMA ([place])   Authorized by:   Lafayette Dragon MD   Signed by:   Awilda Bill CMA ([place]) on 03/30/2009   Method used:    Electronically to        [facility].* (retail)       [address]       [place], [place]  [address]       Ph: [phone] or [phone]       Fax: [phone]   RxID:   [id number] ANALPRAM-HC 1-2.5 % CREA (HYDROCORTISONE ACE-PRAMOXINE) Apply two times a day to three times a day as needed  #30 grams x 1   Entered by:   Awilda Bill CMA ([place])   Authorized by:   Lafayette Dragon MD   Signed by:   Awilda Bill CMA ([place]) on 03/30/2009   Method used:   Electronically to        [facility] (retail)       [address]       [place], [place]  [address]       Ph: [phone] or [phone]       Fax: [phone]   RxID:   [id number] AZATHIOPRINE 50 MG TABS (AZATHIOPRINE) Take 3 tablets (150 mg) once daily  #90 x 3   Entered by:   Awilda Bill CMA ([place])   Authorized by:   Lafayette Dragon MD   Signed by:   Awilda Bill CMA ([place]) on 03/30/2009   Method used:   Electronically to        [facility].* (retail)       [address]       [place], [place]  [address]       Ph: [phone] or [phone]       Fax: [phone]   RxID:   [id number] LOMOTIL 2.5-0.025 MG TABS (DIPHENOXYLATE-ATROPINE) Take 1 tablet by mouth three times a day  #100 x 0   Entered by:   Awilda Bill CMA ([place])   Authorized by:   Lafayette Dragon MD   Signed by:   Awilda Bill CMA ([place]) on 03/30/2009   Method used:   Printed then faxed to ...       [facility] (retail)       [address]       [place], [place]  [address]       Ph: [phone] or [phone]       Fax: [phone]   RxID:   [id number]    Medication Administration  Injection # 1:    Medication: Vit  B12 1000 mcg    Diagnosis: VITAMIN B12 DEFICIENCY (ICD-266.2)    Route: IM    Site: R deltoid    Exp Date: 10/12    Lot #: [id number]    Mfr: American  Regent    Comments: Dottie will call patient after labs are back to let her know about future b12 injections.    Patient tolerated injection without complications    Given by: Bernita Buffy CMA ([place]) (March 30, 2009 11:09 AM)  Orders Added: 1)  Capsule Endoscopy [Capsule Endoscopy] 2)  Vit B12 1000 mcg [J3420] 3)  TLB-CBC Platelet - w/Differential [85025-CBCD] 4)  TLB-CMP (Comprehensive Metabolic Pnl) [82099-AWUJ] 5)  TLB-B12 + Folate Pnl [82746_82607-B12/FOL] 6)  TLB-IBC Pnl (Iron/FE;Transferrin) [83550-IBC] 7)  TLB-Sedimentation Rate (ESR) [85652-ESR]

## 2010-04-08 NOTE — Letter (Signed)
Summary: Baseline Assessment/[employer]  Baseline Assessment/[employer]   Imported By: Phillis Knack 11/03/2009 09:05:16  _____________________________________________________________________  External Attachment:    Type:   Image     Comment:   External Document

## 2010-04-08 NOTE — Progress Notes (Signed)
  Phone Note Other Incoming   Request: Send information Summary of Call: Request for records received from [facility]. Request forwarded to [employer].     

## 2010-04-08 NOTE — Medication Information (Signed)
Summary: HUMIRA approved til 03/09/10/[employer]  HUMIRA approved til 03/09/10/[employer]   Imported By: Bubba Hales 03/19/2009 07:46:51  _____________________________________________________________________  External Attachment:    Type:   Image     Comment:   External Document

## 2010-04-08 NOTE — Assessment & Plan Note (Signed)
Summary: MONTHLY B12 SHOT...LSW.  Nurse Visit   Allergies: 1)  ! Codeine  Medication Administration  Injection # 1:    Medication: Vit B12 1000 mcg    Diagnosis: VITAMIN B12 DEFICIENCY (ICD-266.2)    Route: IM    Site: R deltoid    Exp Date: 10/2011    Lot #: [id number]    Mfr: [place]    Comments: pt scheduled for next monthly b12 at front desk     Patient tolerated injection without complications    Given by: Christian Mate CMA Deborra Medina) (January 25, 2010 9:44 AM)  Orders Added: 1)  Vit B12 1000 mcg [[id number]]

## 2010-04-08 NOTE — Progress Notes (Signed)
Summary: Triage-Update  Phone Note Call from Patient   Caller: [name] [phone]  Call For: Dr. Olevia Perches Reason for Call: Talk to Nurse Summary of Call: Wants Dr. Olevia Perches to be aware: Pt is in the hosp. @ [facility] receiving IV fluids. Initial call taken by: Webb Laws,  August 20, 2009 2:55 PM  Follow-up for Phone Call        OK.  Follow-up by: Lafayette Dragon MD,  August 20, 2009 10:58 PM

## 2010-04-08 NOTE — Procedures (Signed)
Summary: Colonoscopy  Patient: [name] Note: All result statuses are Final unless otherwise noted.  Tests: (1) Colonoscopy (COL)   COL Colonoscopy           [facility].     [place], [place]  [address]           COLONOSCOPY PROCEDURE REPORT           PATIENT:  [name], [name]  MR#:  [id number]     BIRTHDATE:  Jul 09, 1960, 48 yrs. old  GENDER:  female           ENDOSCOPIST:  Lowella Bandy. Olevia Perches, MD     Referred by:  Gerrit Heck, M.D.           PROCEDURE DATE:  05/21/2009     PROCEDURE:  Colonoscopy [id number]     ASA CLASS:  Class II     INDICATIONS:  Crohn's disease of the distal small bowl diagnosed     in 2009 on SBCE, she had nl EGD/Colon in 2009.     s/p TI resection 10/2008, now increased abd.pain, diarrhea,     repeat SBCE unsuccessful, did not seach the anastomosis           MEDICATIONS:   Versed 8 mg, Fentanyl 75 mcg           DESCRIPTION OF PROCEDURE:   After the risks benefits and     alternatives of the procedure were thoroughly explained, informed     consent was obtained.  Digital rectal exam was performed and     revealed no rectal masses.   The LB [id number] endoscope     was introduced through the anus and advanced to the ileum, without     limitations.  The quality of the prep was good, using MoviPrep.     The instrument was then slowly withdrawn as the colon was fully     examined.     <<PROCEDUREIMAGES>>           FINDINGS:  No polyps or cancers were seen. Random biopsies were     obtained and sent to pathology (see image9). random biopsies of     the normal appearing colon  There was a normal ileo-colonic     anastamosis. With standard forceps, biopsy was obtained and sent     to pathology.  Two polyps were found anastamosis r/o inflammatory     pseudopolyps With standard forceps, biopsy was obtained and sent     to pathology (see image5 and image8).  The terminal ileum appeared     normal. 20 cm of normal  TI examined and appears normal With     standard forceps, biopsy was obtained and sent to pathology (see     image3, image2, and image1).   Retroflexed views in the rectum     revealed no abnormalities.    The scope was then withdrawn from     the patient and the procedure completed.           COMPLICATIONS:  None           ENDOSCOPIC IMPRESSION:     1) No polyps or cancers     2) Normal anastamosis, ileo-col     3) Two polyps in the anastamosis     4) Normal terminal ileum     no evidence of active Crohn's     RECOMMENDATIONS:  1) Await pathology results     continue home meds     Lomotil 1 po bid     OV 3-4 weeks     Prednisone 30 mg qd           REPEAT EXAM:  In 5 year(s) for.           ______________________________     Lowella Bandy. Olevia Perches, MD           CC:           n.     eSIGNED:   Lowella Bandy. Jairus Tonne at 05/21/2009 02:54 PM           [name], [id number]  Note: An exclamation mark (!) indicates a result that was not dispersed into the flowsheet. Document Creation Date: 05/21/2009 2:54 PM _______________________________________________________________________  (1) Order result status: Final Collection or observation date-time: 05/21/2009 14:37 Requested date-time:  Receipt date-time:  Reported date-time:  Referring Physician:   Ordering Physician: [name] [id number]) Specimen Source:  Source: Tawanna Cooler Order Number: [id number] Lab site:   Appended Document: Colonoscopy     Procedures Next Due Date:    Colonoscopy: 06/2014

## 2010-04-08 NOTE — Miscellaneous (Signed)
Summary: Remicade Orders  Clinical Lists Changes  Orders: Added new Test order of Remicade Infusion (Remicade) - Signed

## 2010-04-08 NOTE — Assessment & Plan Note (Signed)
Summary: F/U VISIT...LSW.   History of Present Illness Visit Type: Follow-up Visit Primary GI MD: [name] MD Primary Yolander Goodie: Gerrit Heck, MD Requesting Orlando Devereux: n/a Chief Complaint: Patient here for routine follow up Crohns. She states she has been having up to 9 loose stools daily. No rectal bleeding. History of Present Illness:   This is a 50 year old white female with complicated Crohn's disease. She is status post terminal ileal resection. She was diagnosed with Crohn's in August 2009. She recently switched from Humira to Remicade in August 2011. A CT scan of the abdomen shows circumferential thickening of the terminal ileum. She is on Imuran 150 mg a day, B12, prednisone 20 mg a day, Bentyl , Lomotil 3 times a day. She is having diarrhea, 3-9 bowel movements a day, usually one or 2 bowel movements a night. She has had weight gain due to steroids. She is complaining of right upper quadrant abdominal discomfort and tenderness. Her last colonoscopy in March 2011 showed no evidence of active Crohn's disease. She had a tubular adenoma. There is a family history of colon cancer in a direct relative.   GI Review of Systems    Reports abdominal pain, acid reflux, belching, bloating, and  nausea.     Location of  Abdominal pain: RUQ.    Denies chest pain, dysphagia with liquids, dysphagia with solids, heartburn, loss of appetite, vomiting, vomiting blood, weight loss, and  weight gain.      Reports diarrhea, irritable bowel syndrome, and  rectal pain.     Denies anal fissure, black tarry stools, change in bowel habit, constipation, diverticulosis, fecal incontinence, heme positive stool, hemorrhoids, jaundice, light color stool, liver problems, and  rectal bleeding.    Current Medications (verified): 1)  Cyanocobalamin 1000 Mcg/ml Soln (Cyanocobalamin) .... Inject 1 Ml Intramuscular Once Monthly 2)  Zofran 4 Mg Tabs (Ondansetron Hcl) .... Take 1 Tablet By Mouth Every 6 Hours As Needed  For Nausea. 3)  Omeprazole 20 Mg Tbec (Omeprazole) .... Take 1 Tablet By Mouth Once A Day 4)  Azathioprine 50 Mg Tabs (Azathioprine) .... Take 3 Tablets (150 Mg) Once Daily 5)  Promethazine Hcl 25 Mg Tabs (Promethazine Hcl) .... Take 1 Tablet By Mouth Every 4-6 Hours As Needed For Nausea 6)  Bentyl 20 Mg Tabs (Dicyclomine Hcl) .... Take 1 Tablet By Mouth Two Times A Day 7)  Lomotil 2.5-0.025 Mg Tabs (Diphenoxylate-Atropine) .... Take 1 Tablet By Mouth Three Times A Day 8)  Prednisone 10 Mg Tabs (Prednisone) .... Take As Directed. 9)  Analpram-Hc 1-2.5 % Crea (Hydrocortisone Ace-Pramoxine) .... Apply To Rectum 2 Times Daily (May Give Generic If Cheaper) 10)  Remicade 100 Mg Solr (Infliximab) .... As Directed 11)  Lunesta 2 Mg Tabs (Eszopiclone) .... As Needed At Bedtime 12)  Benazepril Hcl 20 Mg Tabs (Benazepril Hcl) .... One Tablet By Mouth Once Daily 13)  Symbicort 80-4.5 Mcg/act Aero (Budesonide-Formoterol Fumarate) .... 2 Puffs Twice Daily 14)  Fluticasone Propionate 50 Mcg/act Susp (Fluticasone Propionate) .... Two Times A Day 15)  Proair Hfa 108 (90 Base) Mcg/act Aers (Albuterol Sulfate) .... 2 Puffs As Needed 16)  Nystatin 100000 Unit/ml Susp (Nystatin) .... Swish and Spit Four Times A Day 17)  Flexeril 10 Mg Tabs (Cyclobenzaprine Hcl) .... Take One By Mouth Q Hs As Needed Cramps  Allergies (verified): 1)  ! Codeine  Past History:  Past Medical History: Reviewed history from 02/03/2009 and no changes required. Current Problems: CROHNS DISEASE  DEPRESSION (ICD-311) HEARING LOSS (ICD-389.9) HEMORRHOIDS,  HX OF (ICD-V12.79) HIATAL HERNIA (ICD-553.3) COLONIC POLYPS, ADENOMATOUS, HX OF (ICD-V12.72) GERD (ICD-530.81)  Past Surgical History: Reviewed history from 02/03/2009 and no changes required. Partial Hysterectomy S/P ILEOCECECTOMY 10/2008 Right carpal tunnel release Annal Fissure repair Cholecystectomy  Family History: Reviewed history from 11/07/2007 and no changes  required. Family History of Colon Cancer: Mother Family History of Breast Cancer:Grandmother Family History of Colitis/Crohn's:Aunts Family History of Colon Polyps::Mother, Father , Brother  Social History: Patient is a former smoker.-stopped around age 45  Alcohol Use - yes-rare Occupation: Cook Daily Caffeine Use -1 Patient does not get regular exercise.   Review of Systems  The patient denies allergy/sinus, anemia, anxiety-new, arthritis/joint pain, back pain, blood in urine, breast changes/lumps, change in vision, confusion, cough, coughing up blood, depression-new, fainting, fatigue, fever, headaches-new, hearing problems, heart murmur, heart rhythm changes, itching, menstrual pain, muscle pains/cramps, night sweats, nosebleeds, pregnancy symptoms, shortness of breath, skin rash, sleeping problems, sore throat, swelling of feet/legs, swollen lymph glands, thirst - excessive , urination - excessive , urination changes/pain, urine leakage, vision changes, and voice change.         Pertinent positive and negative review of systems were noted in the above HPI. All other ROS was otherwise negative.   Vital Signs:  Patient profile:   50 year old female Height:      68 inches Weight:      219.50 pounds BMI:     33.50 BSA:     2.13 Pulse rate:   80 / minute Pulse rhythm:   regular BP sitting:   140 / 80  (left arm)  Vitals Entered By: Madlyn Frankel CMA Deborra Medina) (February 18, 2010 8:18 AM)  Physical Exam  General:  cushingoid-appearing, alert and oriented. Mouth:  no thrush. Neck:  thick neck. Lungs:  Clear throughout to auscultation. Heart:  Regular rate and rhythm; no murmurs, rubs,  or bruits. Abdomen:  truncal obesity. Marked tenderness along the right costal margin. Liver edge tender but not enlarged. No ascites. Normoactive bowel sounds. Status post cholecystectomy scars. Lower abdomen unremarkable. Extremities:  no edema. Skin:  ecchymoses. Psych:  Alert and  cooperative. Normal mood and affect.   Impression & Recommendations:  Problem # 1:  FAMILY HX COLON CANCER (ICD-V16.0) Patient will be due for a repeat colonoscopy in March 2016.  Problem # 2:  DIARRHEA (ICD-787.91) Patient has chronic diarrhea due to Crohn's disease which is controlled with Lomotil.  Problem # 3:  CROHN'S DISEASE (ICD-555.9) Patient has difficult to control Crohn's disease of the small bowel. Her current abdominal pain is related to weight gain and tender ribs. We will have to continue her prednisone taper. She will decrease by 2.5 mg every 2 weeks. We will increase her Remicade to 5 mg/kg every 7 weeks. We may need to consider doubling up on the Remicade dose if she continues to have diarrhea. She needs a refill on her medications. 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 She is due for a B12 shot today.  Other Orders: Vit B12 1000 mcg ([id number])  Patient Instructions: 1)  Please pick up your prescriptions at the pharmacy. Electronic prescription(s) has already been sent for Azothiaprine, Bentyl, Lomotil, Prednisone and Analpram. 2)  Please decrease your Prednisone to 17.5 grams and then decrease by 2.5 mg every 2 weeks. 3)  We have given you a B12 injection today. 4)  Your Remicade should be given every 7 weeks now instead of every 8 weeks. We will send new orders to [facility]  Long Short Stay. 5)  Office visit 8 weeks. 6)  Copy sent to : Dr Rocky Link 7)  The medication list was reviewed and reconciled.  All changed / newly prescribed medications were explained.  A complete medication list was provided to the patient / caregiver. Prescriptions: ANALPRAM-HC 1-2.5 % CREA (HYDROCORTISONE ACE-PRAMOXINE) Apply to rectum 2 times daily (may give generic if cheaper)  #30 grams x 1   Entered by:   Madlyn Frankel CMA (AAMA)   Authorized by:   Lafayette Dragon MD   Signed by:   [place] (AAMA) on 02/18/2010   Method used:   Electronically to         [facility] (retail)       [address]       [place], [place]  [address]       Ph: [phone] or [phone]       Fax: [phone]   RxID:   [id number] PREDNISONE 10 MG TABS (PREDNISONE) Take as directed.  #100 x 0   Entered by:   Madlyn Frankel CMA (AAMA)   Authorized by:   Lafayette Dragon MD   Signed by:   Madlyn Frankel CMA ([place]) on 02/18/2010   Method used:   Electronically to        [facility] (retail)       [address]       [place], [place]  [address]       Ph: [phone] or [phone]       Fax: [phone]   RxID:   [id number] LOMOTIL 2.5-0.025 MG TABS (DIPHENOXYLATE-ATROPINE) Take 1 tablet by mouth three times a day  #100 x 1   Entered by:   Madlyn Frankel CMA (AAMA)   Authorized by:   Lafayette Dragon MD   Signed by:   [place] ([place]) on 02/18/2010   Method used:   Printed then faxed to ...       [facility] [place] (retail)       [address]       [place], [place]  [address]       Ph: [phone] or [phone]       Fax: [phone]   RxID:   [id number] BENTYL 20 MG TABS (DICYCLOMINE HCL) Take 1 tablet by mouth two times a day  #60 x 1   Entered by:   Madlyn Frankel CMA (AAMA)   Authorized by:   Lafayette Dragon MD   Signed by:   Madlyn Frankel CMA ([place]) on 02/18/2010   Method used:   Electronically to        [facility] (retail)       [address]       [place], [place]  [address]       Ph: [phone] or [phone]       Fax: [phone]   RxID:   [id number] AZATHIOPRINE 50 MG TABS (AZATHIOPRINE) Take 3 tablets (150 mg) once daily  #90 x 1   Entered by:   Madlyn Frankel CMA (AAMA)   Authorized by:   Lafayette Dragon MD   Signed by:   [place] ([place]) on 02/18/2010   Method used:  Electronically to         [facility].* (retail)       [address]       [place], [place]  [address]       Ph: [phone] or [phone]       Fax: [phone]   RxID:   [id number] OMEPRAZOLE 20 MG TBEC (OMEPRAZOLE) Take 1 tablet by mouth once a day  #30 x 1   Entered by:   Madlyn Frankel CMA ([place])   Authorized by:   Lafayette Dragon MD   Signed by:   Madlyn Frankel CMA ([place]) on 02/18/2010   Method used:   Electronically to        [facility].* (retail)       [address]       [place], [place]  [address]       Ph: [phone] or [phone]       Fax: [phone]   RxID:   [id number] DIFLUCAN 100 MG TABS (FLUCONAZOLE) Take 1 tablet ONCE per WEEK x 12 weeks  #12 x 0   Entered by:   Madlyn Frankel CMA (AAMA)   Authorized by:   Lafayette Dragon MD   Signed by:   Madlyn Frankel CMA ([place]) on 02/18/2010   Method used:   Electronically to        [facility].* (retail)       [address]       [place], [place]  [address]       Ph: [phone] or [phone]       Fax: [phone]   RxID:   [id number]    Medication Administration  Injection # 1:    Medication: Vit B12 1000 mcg    Diagnosis: VITAMIN B12 DEFICIENCY (ICD-266.2)    Route: IM    Site: R deltoid    Exp Date: 10/13    Lot #: [id number]    Mfr: [place]    Patient tolerated injection without complications    Given by: Randye Lobo NCMA (February 18, 2010 9:24 AM)  Orders Added: 1)  Vit B12 1000 mcg [[id number]]

## 2010-04-08 NOTE — Procedures (Signed)
Summary: 555.1, 789.05/dn  Patient here today for capsule endoscopy for Dr. Olevia Perches .  Pt verbalized understanding of all verbal and written instructions.  Pt tolerated well.  Lot #  [id number]  exp 2012-01 .

## 2010-04-08 NOTE — Assessment & Plan Note (Signed)
Summary: 266.2/monthly b-12 inj/all  Nurse Visit   Allergies: 1)  ! Codeine  Medication Administration  Injection # 1:    Medication: Vit B12 1000 mcg    Diagnosis: VITAMIN B12 DEFICIENCY (ICD-266.2)    Route: IM    Site: R deltoid    Exp Date: 11/12    Lot #: [id number]    Mfr: [place]    Patient tolerated injection without complications    Given by: Randye Lobo NCMA (May 28, 2009 2:27 PM)  Orders Added: 1)  Vit B12 1000 mcg [[id number]]

## 2010-04-08 NOTE — Progress Notes (Signed)
Summary: Triage  Phone Note Call from Patient Call back at Home Phone [phone]   Caller: Patient Call For: Dr. Olevia Perches Reason for Call: Talk to Nurse Summary of Call: pt is having diarrhea and nausea and starting to get dehydrated. She has used phenergan suppository Initial call taken by: Webb Laws,  August 19, 2009 12:03 PM  Follow-up for Phone Call        Message left for patient to callback. Vivia Ewing LPN  August 19, 3604 7:70 PM  Message left for patient to callback. Vivia Ewing LPN  August 20, 3401 5:24 AM  Since Monday pt. c/o n/v, diarrhea and abd. pain.  Denies blood, fever. Pt. states, "I have stayed in a sweat, it is really odd."  Unable to keep meds/foods/fluids down, she feels dehydrated and fatigued. States she doesn't hurt as bad today and did suck on some ice this morning. Pt. advised to go to ER for IV hydration, pt. declines, "Just see what Dr.Brodie says."  DR.BRODIE PLEASE ADVISE  Follow-up by: Vivia Ewing LPN,  August 21, 8183 9:09 AM  Additional Follow-up for Phone Call Additional follow up Details #1::        Please obtain Labs CBC,C-met, continue Prednisone 46m ???/day. I agree to go to ER for IV fluids. Additional Follow-up by: DLafayette DragonMD,  August 20, 2009 9:19 AM    Additional Follow-up for Phone Call Additional follow up Details #2::    Above MD orders reviewed with patient's husband. She has been on Prednisone 170mdaily, will increase to 3044mPt. refuses to go to the ER, but husband will [facility] to get [facility].  Follow-up by: DebVivia EwingN,  June 16, [id number]:[id number]  Additional Follow-up for Phone Call Additional follow up Details #3:: Details for Additional Follow-up Action Taken: OK Additional Follow-up by: DorLafayette Dragon,  August 20, 2009 1:13 PM

## 2010-04-08 NOTE — Progress Notes (Signed)
Summary: VB12 Orders  ---- Converted from flag ---- ---- 03/30/2009 2:18 PM, Lafayette Dragon MD wrote: B12 1000ug IM monthly  ---- 03/30/2009 2:17 PM, Vivia Ewing LPN wrote: DR.Sofhia Ulibarri--Pt. wants to know when and how often she needs VB12 shots. ------------------------------  Phone Note Outgoing Call   Call placed by: Vivia Ewing LPN,  March 31, 4751 2:29 PM Call placed to: Patient Summary of Call: Above MD orders reviewed with patient. She has scheduled the next appt. for 04-30-09 at 9am. Pt. instructed to call back as needed.  Initial call taken by: Vivia Ewing LPN,  March 31, 3915 2:31 PM

## 2010-04-08 NOTE — Procedures (Signed)
Summary: Capsule Endoscopy   Capsule Endoscopy  Procedure date:  04/16/2009  Findings:      Performing Location: [facility]   Ordering Physician: [name], MD  Report created/read by: Silvano Rusk, MD  Reason for Referral:  50 y/o female with small bowel crohn's.  Assess disease activity  Procedure Information and Findings:  1) incomplete study, capsule did not appear to reach the colon, with limited visulazation beyond 4 hours asd 44 minutes.  2) Mild inflammatory changes with some edema, erythema and red spot in more distal small bowel starting around 4 hours, 43 minutes.  Summary and Recommendations:  Mild inflammatory changes in small bowel which could be consistent with crohn's, poor visualization limits study.  Would document passage of the capsule by patient report or KUB.  Consider repeat exam with more aggressive prep if clinically indicated.  Other plans per Dr Olevia Perches.  This report was created from the original report, which was reviewed and signed by the above listed reading physician.   Appended Document: Capsule Endoscopy incomplete study discussed with the pt. Compared with the last stydy, no discrete ulcerations, but the capsule did not reach the cecum. She saw it come out the following day.She is feeling OK

## 2010-04-08 NOTE — Assessment & Plan Note (Signed)
Summary: 2-WEEK B12 SHOT...LSW.  Nurse Visit   Allergies: 1)  ! Codeine  Medication Administration  Injection # 1:    Medication: Vit B12 1000 mcg    Diagnosis: VITAMIN B12 DEFICIENCY (ICD-266.2)    Route: IM    Site: R deltoid    Exp Date: 12/06/2010    Lot #: [id number]    Mfr: [employer]    Comments: patient will get her next b12 at her 03/30/2009 office visit.    Patient tolerated injection without complications    Given by: Bernita Buffy CMA Deborra Medina) (March 12, 2009 2:32 PM)

## 2010-04-08 NOTE — Procedures (Signed)
Summary: Instruction for procedure/[facility]  Instruction for procedure/[facility]   Imported By: Phillis Knack 04/02/2009 14:08:20  _____________________________________________________________________  External Attachment:    Type:   Image     Comment:   External Document

## 2010-04-08 NOTE — Assessment & Plan Note (Signed)
Summary: 8-WEEK F/U APPT...LSW.   History of Present Illness Visit Type: Follow-up Visit Primary GI MD: [name] MD Primary Provider: Gerrit Heck, MD Requesting Provider: n/a Chief Complaint: F/u from capsule endo, nausea, and diarrhea. Pt states that she is still having nausea and diarrhea  History of Present Illness:   This is a 50 year old white female with Crohn's disease of the small intestine diagnosed on a small bowel capsule endoscopy in August 2009. She is status post terminal ileum resection and ileocolic anastomoses in August 2010. She came to the office most recently with an exacerbation of diarrhea, having 9-15 bowel movements a day and at night along with crampy abdominal pain. There was no vomiting but she had some nausea and increased reflux. She was unable to tolerate Questran. Her medications include Imuran 100 mg daily, Humira 40 mg and every 2 weeks. She has tapered off her prednisone. Her labs from January 2011 were all normal except for a sedimentation rate of 26. Her last colonoscopy in [place]  in April 2009 was normal. Her last upper endoscopy in 1994 showed a hiatal hernia and esophagitis. Patient had a capsule endoscopy in February 2011 which was an incomplete study. The capsule did not appear to reach the colon and there was limited visulazation beyond 4 hours and 44 minutes. There were mild inflammatory changes with some edema, erythema and a red spot in the more distal small bowel starting around 4 hours, 43 minutes. Patient comes today with continued complaints of nausea and diarrhea.    GI Review of Systems    Reports nausea.      Denies abdominal pain, acid reflux, belching, bloating, chest pain, dysphagia with liquids, dysphagia with solids, heartburn, loss of appetite, vomiting, vomiting blood, weight loss, and  weight gain.      Reports diarrhea.     Denies anal fissure, black tarry stools, change in bowel habit, constipation, diverticulosis, fecal  incontinence, heme positive stool, hemorrhoids, irritable bowel syndrome, jaundice, light color stool, liver problems, rectal bleeding, and  rectal pain.    Current Medications (verified): 1)  Cyanocobalamin 1000 Mcg/ml Soln (Cyanocobalamin) .... Inject 1 Ml Intramuscular Twice Monthly 2)  Humira Pen-Psoriasis Starter 40 Mg/0.76m Kit (Adalimumab) .... 460mHumira Every Other Week 3)  Analpram-Hc 1-2.5 % Crea (Hydrocortisone Ace-Pramoxine) .... Apply Two Times A Day To Three Times A Day As Needed 4)  Zofran 4 Mg Tabs (Ondansetron Hcl) .... Take 1 Tablet By Mouth Every 6 Hours As Needed For Nausea. 5)  Omeprazole 20 Mg Tbec (Omeprazole) .... Take 1 Tablet By Mouth Once A Day 6)  Azathioprine 50 Mg Tabs (Azathioprine) .... Take 3 Tablets (150 Mg) Once Daily 7)  Promethazine Hcl 25 Mg Tabs (Promethazine Hcl) .... As Needed 8)  Bentyl 10 Mg Caps (Dicyclomine Hcl) .... Take 1 Tab 3-4 Times Daily As Needed For Cramping, Spasms 9)  Lomotil 2.5-0.025 Mg Tabs (Diphenoxylate-Atropine) .... Take 1 Tablet By Mouth Three Times A Day  Allergies (verified): 1)  ! Codeine  Past History:  Past Medical History: Reviewed history from 02/03/2009 and no changes required. Current Problems: CROHNS DISEASE  DEPRESSION (ICD-311) HEARING LOSS (ICD-389.9) HEMORRHOIDS, HX OF (ICD-V12.79) HIATAL HERNIA (ICD-553.3) COLONIC POLYPS, ADENOMATOUS, HX OF (ICD-V12.72) GERD (ICD-530.81)  Past Surgical History: Reviewed history from 02/03/2009 and no changes required. Partial Hysterectomy S/P ILEOCECECTOMY 10/2008 Right carpal tunnel release Annal Fissure repair Cholecystectomy  Family History: Reviewed history from 11/07/2007 and no changes required. Family History of Colon Cancer: Mother Family History of Breast  Cancer:Grandmother Family History of Colitis/Crohn's:Aunts Family History of Colon Polyps::Mother, Father , Brother  Social History: Reviewed history from 11/07/2007 and no changes required. Patient  is a former smoker.  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   Review of Systems       The patient complains of fatigue.  The patient denies allergy/sinus, anemia, anxiety-new, arthritis/joint pain, back pain, blood in urine, breast changes/lumps, change in vision, confusion, cough, coughing up blood, depression-new, fainting, fever, headaches-new, hearing problems, heart murmur, heart rhythm changes, itching, menstrual pain, muscle pains/cramps, night sweats, nosebleeds, pregnancy symptoms, shortness of breath, skin rash, sleeping problems, sore throat, swelling of feet/legs, swollen lymph glands, thirst - excessive , urination - excessive , urination changes/pain, urine leakage, vision changes, and voice change.         Pertinent positive and negative review of systems were noted in the above HPI. All other ROS was otherwise negative.   Vital Signs:  Patient profile:   50 year old female Height:      68 inches Weight:      192 pounds BMI:     29.30 BSA:     2.01 Pulse rate:   64 / minute Pulse rhythm:   regular BP sitting:   126 / 80  (left arm) Cuff size:   regular  Vitals Entered By: Hope Pigeon CMA (May 18, 2009 10:11 AM)  Physical Exam  General:  chronically ill appearing. Alert, oriented. Eyes:  PERRLA, no icterus. Mouth:  No deformity or lesions, dentition normal. Neck:  Supple; no masses or thyromegaly. Lungs:  Clear throughout to auscultation. Heart:  Regular rate and rhythm; no murmurs, rubs,  or bruits. Abdomen:  diffusely tender abdomen more in the right lower and left lower quadrants and across the lower abdomen. Upper abdomen is only slightly tender especially in the epigastrium. There is no distention and bowel sounds are normal. Rectal:  soft Hemoccult negative stool. Extremities:  No clubbing, cyanosis, edema or deformities noted. Skin:  Intact without significant lesions or rashes. Psych:   Alert and cooperative. Normal mood and affect.   Impression & Recommendations:  Problem # 1:  DIARRHEA (ICD-787.91) Patient continues to have diarrhea secondary to Crohn's disease of the small bowel. She had an incomplete capsule endoscopy which failed to prove the presence of small bowel ulcerations. Her diarrhea is also due to her right colon resection and possibly due to an irritable bowel syndrome. We will start her dicyclomine 20 mg twice a day.  Problem # 2:  CROHN'S DISEASE (ICD-555.9) Patient has Crohn's disease of the small bowel. She is status post terminal ileal resection in August 2010. She has recurrent disease in the small bowel. We will increase her Humira to 80 mg every 2 weeks. She is to continue Imuran and azothiaprine 50 mg daily and start prednisone 30 mg daily for the next 4 weeks. We will proceed with a colonoscopy because of her rectal pain and crampy lower abdominal pain suggestive of colitis.  Orders: Colonoscopy (Colon)  Patient Instructions: 1)  Dicyclomine 20 mg p.o. b.i.d. 2)  Prednisone 30 mg daily x 4 weeks then 20 mg 3)  Humira increased to 80 mg every 2 Velna Ochs, LPN advised. She will send new prescription. 4)  Colonoscopy scheduled for 05/21/09. 5)  Continue Imuran 150 mg daily. 6)  The medication list was reviewed and reconciled.  All changed / newly prescribed medications were explained.  A complete  medication list was provided to the patient / caregiver. Prescriptions: PREDNISONE 10 MG TABS (PREDNISONE) Take 3 tablets by mouth once daily  #100 x 0   Entered by:   Awilda Bill CMA ([place])   Authorized by:   Lafayette Dragon MD   Signed by:   Awilda Bill CMA ([place]) on 05/18/2009   Method used:   Electronically to        [facility] (retail)       [address]       [place], [place]  [address]       Ph: [phone] or [phone]       Fax: [phone]   RxID:   [id number] BENTYL 20 MG TABS  (DICYCLOMINE HCL) Take 1 tablet by mouth two times a day  #60 x 2   Entered by:   Awilda Bill CMA ([place])   Authorized by:   Lafayette Dragon MD   Signed by:   Awilda Bill CMA ([place]) on 05/18/2009   Method used:   Electronically to        [facility].* (retail)       [address]       [place], [place]  [address]       Ph: [phone] or [phone]       Fax: [phone]   RxID:   [id number] DULCOLAX 5 MG  TBEC (BISACODYL) Day before procedure take 2 at 3pm and 2 at 8pm.  #4 x 0   Entered by:   Awilda Bill CMA ([place])   Authorized by:   Lafayette Dragon MD   Signed by:   Awilda Bill CMA ([place]) on 05/18/2009   Method used:   Electronically to        [facility] (retail)       [address]       [place], [place]  [address]       Ph: [phone] or [phone]       Fax: [phone]   RxID:   [id number] REGLAN 10 MG  TABS (METOCLOPRAMIDE HCL) As per prep instructions.  #2 x 0   Entered by:   Awilda Bill CMA ([place])   Authorized by:   Lafayette Dragon MD   Signed by:   Awilda Bill CMA ([place]) on 05/18/2009   Method used:   Electronically to        [facility].* (retail)       [address]       [place], [place]  [address]       Ph: [phone] or [phone]       Fax: [phone]   RxID:   [id number] MIRALAX   POWD (POLYETHYLENE GLYCOL 3350) As per prep  instructions.  #255gm x 0   Entered by:   Awilda Bill CMA ([place])   Authorized by:   Lafayette Dragon MD   Signed by:   Awilda Bill CMA ([place]) on 05/18/2009   Method used:   Electronically to        [facility] (retail)       [address]  [place], [place]  [address]       Ph: [phone] or [phone]       Fax: [phone]   RxID:   [id number]

## 2010-04-08 NOTE — Op Note (Signed)
Summary: Remicade/[place]  Remicade/[place]   Imported By: Phillis Knack 09/22/2009 09:23:53  _____________________________________________________________________  External Attachment:    Type:   Image     Comment:   External Document

## 2010-04-08 NOTE — Assessment & Plan Note (Signed)
Summary: 8 WEEK F/U..AM.   History of Present Illness Visit Type: Follow-up Visit Primary GI MD: [name] MD Primary Provider: Gerrit Heck, MD Requesting Provider: n/a Chief Complaint: Post hospital- Crohn's; patient is better History of Present Illness:   This is a 50 year old white female with Crohn's disease of the small intestine diagnosed on a small bowel capsule endoscopy in August 2009. She is status post terminal ileum resection and ileocolic anastomoses in August 2010. Her medications include Imuran 100 mg daily, Humira 40 mg  every 1 week x 3 months ( started 2 years ago). She tapered off her prednisone. A colonoscopy in [place] in April 2009 was normal. Her last upper endoscopy in 1994 showed a hiatal hernia and esophagitis. Patient had a capsule endoscopy in February 2011 which was an incomplete study. The capsule did not appear to reach the colon and there was limited visulazation beyond 4 hours and 44 minutes. There were mild inflammatory changes with some edema, erythema and a red spot in the more distal small bowel starting around 4 hours, 43 minutes. Patient had a colonoscopy on 05/21/09 which showed no polyps or cancers. Patient had a normal anastamosis with the exception of 2 polyps and she had a normal terminal ileum. There was no evidence of active Crohn's disease. Biopsies of the distal ileum showed benign small bowel mucosa with no villous atrophy, inflammation or other abnormalities. Biopsies of the ileocecal valve a the anastomosis showed inflamed colonic and small bowel type mucosa consistant with the anastomosis site. Biopsies of the polyps near the anastomosis showed tubular adenomas but there was no high grade dysplasia identified. Random biopsies taken showed benign mucosa. Since patient's last visit wifh [place], she was hospitalized after 3 days of nausea, vomiting, and abdominal pain. A CT scan of abdomen and pelvis completed on the day of admission showed new mild  circumferential wall thickening involving the distal small bowel, ascending and proximal transverse colon suggesting colitis and ileitis. There was also a small amount of pelvic ascites as well.  Patient was placed on bowel rest and given supportive treatment with IV fluids. She was also started on IV Solu-Medrol in addition to her usual Crohn's medications. She did take Humira 40 mg IV weekly on Fridays prior to her hospitalization and she actually received her dose of Humira while hospitalized. The patient had a decrease in the amount of stools in the day, her abdominal pain decreased and she was started on clear liquids and advanced to a low-residue diet. The patient tolerated her diet well and is now on prednisone 40 mg p.o.daily, in addition to Imuran, Lomotil, Bentyl, ciprofloxacin and Flagyl. The ciprofloxacin and Flagyl was discussed with Dr. Henrene Pastor.Although the patient has 3 negative stools for C difficile, he felt that he wanted to continue the Cipro and Flagyl as part of the treatment for the exacerbation of Crohn's disease. The patient was asked to continue the Cipro and Flagyl for a total of 14 days, which is 10 additional days. Patient comes today for a follow up. She is complaining of right upper quadrant abdominal pain and diarrhea 3-5 times a day.   GI Review of Systems    Reports abdominal pain and  nausea.      Denies acid reflux, belching, bloating, chest pain, dysphagia with liquids, dysphagia with solids, heartburn, loss of appetite, vomiting, vomiting blood, weight loss, and  weight gain.        Denies anal fissure, black tarry stools, change in bowel habit, constipation,  diarrhea, diverticulosis, fecal incontinence, heme positive stool, hemorrhoids, irritable bowel syndrome, jaundice, light color stool, liver problems, rectal bleeding, and  rectal pain.    Current Medications (verified): 1)  Cyanocobalamin 1000 Mcg/ml Soln (Cyanocobalamin) .... Inject 1 Ml Intramuscular Twice  Monthly 2)  Humira Pen-Psoriasis Starter 40 Mg/0.18m Kit (Adalimumab) .... 875mSq Every Other Week 3)  Analpram-Hc 1-2.5 % Crea (Hydrocortisone Ace-Pramoxine) .... Apply Two Times A Day To Three Times A Day As Needed 4)  Zofran 4 Mg Tabs (Ondansetron Hcl) .... Take 1 Tablet By Mouth Every 6 Hours As Needed For Nausea. 5)  Omeprazole 20 Mg Tbec (Omeprazole) .... Take 1 Tablet By Mouth Once A Day 6)  Azathioprine 50 Mg Tabs (Azathioprine) .... Take 3 Tablets (150 Mg) Once Daily 7)  Promethazine Hcl 25 Mg Tabs (Promethazine Hcl) .... As Needed 8)  Bentyl 20 Mg Tabs (Dicyclomine Hcl) .... Take 1 Tablet By Mouth Two Times A Day 9)  Lomotil 2.5-0.025 Mg Tabs (Diphenoxylate-Atropine) .... Take 1 Tablet By Mouth Three Times A Day 10)  Prednisone 10 Mg Tabs (Prednisone) .... Take 4 Tablets By Mouth Once Daily 11)  Analpram-Hc 1-2.5 % Crea (Hydrocortisone Ace-Pramoxine) .... Apply To Rectum 2 Times Daily (May Give Generic If Cheaper)  Allergies (verified): 1)  ! Codeine  Past History:  Past Medical History: Reviewed history from 02/03/2009 and no changes required. Current Problems: CROHNS DISEASE  DEPRESSION (ICD-311) HEARING LOSS (ICD-389.9) HEMORRHOIDS, HX OF (ICD-V12.79) HIATAL HERNIA (ICD-553.3) COLONIC POLYPS, ADENOMATOUS, HX OF (ICD-V12.72) GERD (ICD-530.81)  Past Surgical History: Reviewed history from 02/03/2009 and no changes required. Partial Hysterectomy S/P ILEOCECECTOMY 10/2008 Right carpal tunnel release Annal Fissure repair Cholecystectomy  Family History: Reviewed history from 11/07/2007 and no changes required. Family History of Colon Cancer: Mother Family History of Breast Cancer:Grandmother Family History of Colitis/Crohn's:Aunts Family History of Colon Polyps::Mother, Father , Brother  Social History: Reviewed history from 11/07/2007 and no changes required. Patient is a former smoker.  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   Review of Systems       The patient complains of anemia, anxiety-new, arthritis/joint pain, back pain, cough, fatigue, fever, headaches-new, hearing problems, muscle pains/cramps, night sweats, sleeping problems, sore throat, swelling of feet/legs, and thirst - excessive.  The patient denies allergy/sinus, blood in urine, breast changes/lumps, change in vision, confusion, coughing up blood, depression-new, fainting, heart murmur, heart rhythm changes, itching, menstrual pain, nosebleeds, pregnancy symptoms, shortness of breath, skin rash, swollen lymph glands, thirst - excessive , urination - excessive , urination changes/pain, urine leakage, vision changes, and voice change.         Pertinent positive and negative review of systems were noted in the above HPI. All other ROS was otherwise negative.   Vital Signs:  Patient profile:   [age]ear old female Height:      68 inches Weight:      194 pounds BMI:     29.60 Pulse rate:   80 / minute Pulse rhythm:   regular BP sitting:   152 / 90  (left arm) Cuff size:   regular  Vitals Entered By: June McMurray CMButlerADeborra Medina(September 08, 2009 8:12 AM)  Physical Exam  General:  cushingoid-appearing, alert and oriented. Eyes:  nonicteric. Mouth:  no aphthous ulcers. Neck:  Supple; no masses or thyromegaly. Lungs:  inspiratory and expiratory high pitched wheezes Heart:  Regular rate and rhythm; no murmurs, rubs,  or bruits.  Abdomen:  mildly protuberant abdomen soft but tender in the right upper quadrant and across transverse colon. No rebound or mass. Normal active bowel sounds. Extremities:  no edema. Skin:  no lesions. Psych:  Alert and cooperative. Normal mood and affect.   Impression & Recommendations:  Problem # 1:  CROHN'S DISEASE (ICD-555.9) Patient has Crohn's disease of the terminal ileum and of  the transverse and ascending colon as per recent CT scan of the abdomen. Her flareup has occurred  while she has been on maximum medical therapy and complying 100% with her medications. I am concerned that we are running out options . I would consider  switching her from Humira to Remicade. We need to cut back on her prednisone from 40 to 30 mg a day and have her continue all other medications. We will obtain lab tests today. She will see Dr Rocky Link  tomorrow and ask for sleeping medication since  the prednisone keeps her up at night. Orders: TLB-CBC Platelet - w/Differential (85025-[id number]) TLB-IBC Pnl (Iron/FE;Transferrin) (83550-IBC) TLB-CMP (Comprehensive Metabolic Pnl) (68127-NTZG)  Problem # 2:  ABDOMINAL PAIN RIGHT LOWER QUADRANT (ICD-789.03) Patient has Crohn's disease of the transverse and ascending colon by CT scan. She had a normal colonoscopy in March of this year. A small bowel capsule endoscopy recently was incomplete due to retention of the capsule in the small bowel most likely due to partial obstruction.  Problem # 3:  SBO (ICD-560.9) Patient currently has no obstructive symptoms. She needs to stay on a low-residue diet with small frequent feedings.  Patient Instructions: 1)  Please decrease your Prednisone as directed to 30 mg daily x 4 weeks, then 20 mg/day. 2)  Go to the lab before leaving the office today for labs including a CBC, CMET and IBC panel. 3)  We will get you switched from Humira to Remicade. Neoma Laming will be calling you with that information. 4)  Please come in for an office visit 6 weeks for follow up. 5)  Copy sent to : Dr Gerrit Heck 6)  The medication list was reviewed and reconciled.  All changed / newly prescribed medications were explained.  A complete medication list was provided to the patient / caregiver.

## 2010-04-08 NOTE — Letter (Signed)
Summary: [facility] Instructions  [facility]  [place], [place] [address]   Phone: [phone]  Fax: [phone]       [name]    [birth year]    MRN: [id number]       Procedure Day /Date: 05/21/09 Thursday     Arrival Time: 1:00 pm     Procedure Time: 2:00 pm     Location of Procedure:                    _ x_  [place] (4th Floor)  [facility]  Starting 5 days prior to your procedure 05/16/09 do not eat nuts, seeds, popcorn, corn, beans, peas,  salads, or any raw vegetables.  Do not take any fiber supplements (e.g. Metamucil, Citrucel, and Benefiber). ____________________________________________________________________________________________________   THE DAY BEFORE YOUR PROCEDURE         DATE: 05/20/09 DAY: Wednesday  1   Drink clear liquids the entire day-NO SOLID FOOD  2   Do not drink anything colored red or purple.  Avoid juices with pulp.  No orange juice.  3   Drink at least 64 oz. (8 glasses) of fluid/clear liquids during the day to prevent dehydration and help the prep work efficiently.  CLEAR LIQUIDS INCLUDE: Water Jello Ice Popsicles Tea (sugar ok, no milk/cream) Powdered fruit flavored drinks Coffee (sugar ok, no milk/cream) Gatorade Juice: apple, white grape, white cranberry  Lemonade Clear bullion, consomm, broth Carbonated beverages (any kind) Strained chicken noodle soup Hard Candy  4   Mix the entire bottle of Miralax with 64 oz. of Gatorade/Powerade in the morning and put in the refrigerator to chill.  5   At 3:00 pm take 2 Dulcolax/Bisacodyl tablets.  6   At 4:30 pm take one Reglan/Metoclopramide tablet.  7  Starting at 5:00 pm drink one 8 oz glass of the Miralax mixture every 15-20 minutes until you have finished drinking the entire 64 oz.  You should finish drinking prep around 7:30 or 8:00 pm.  8   If you are nauseated, you may take the 2nd Reglan/Metoclopramide  tablet at 6:30 pm.        9    At 8:00 pm take 2 more DULCOLAX/Bisacodyl tablets.        THE DAY OF YOUR PROCEDURE      DATE:  05/21/09 DAY: Thursday  You may drink clear liquids until 12:00 pm  (2 HOURS BEFORE PROCEDURE).   MEDICATION INSTRUCTIONS  Unless otherwise instructed, you should take regular prescription medications with a small sip of water as early as possible the morning of your procedure.        OTHER INSTRUCTIONS  You will need a responsible adult at least 51 years of age to accompany you and drive you home.   This person must remain in the waiting room during your procedure.  Wear loose fitting clothing that is easily removed.  Leave jewelry and other valuables at home.  However, you may wish to bring a book to read or an iPod/MP3 player to listen to music as you wait for your procedure to start.  Remove all body piercing jewelry and leave at home.  Total time from sign-in until discharge is approximately 2-3 hours.  You should go home directly after your procedure and rest.  You can resume normal activities the day after your procedure.  The day of your procedure you should not:   Drive   Make  legal decisions   Operate machinery   Drink alcohol   Return to work  You will receive specific instructions about eating, activities and medications before you leave.   The above instructions have been reviewed and explained to me by  [place] [facility] Deborra Medina)  May 18, 2009 11:42 AM     I fully understand and can verbalize these instructions _____________________________ Date 05/18/09

## 2010-04-08 NOTE — Progress Notes (Signed)
Summary: Change from Humira to Remicade  Phone Note Outgoing Call   Call placed by: Vivia Ewing LPN,  September 10, 3466 87:37 AM Call placed to: Patient Summary of Call: Pt. needs to be switched from Humira to Remicade infusions, per Dr.Calab Sachse/OV note on 09-08-09. She is scheduled for her 1st 3 Remicade infusions at [facility] on 09-11-09 at 10:30am, 09-25-09 at 10:30am and 10-23-09 at 10:30am. Message left for patient to callback.  Initial call taken by: Vivia Ewing LPN,  September 10, 3079 68:38 AM  Follow-up for Phone Call        Above appts. reviewed w/pt. She agrees on dates/times. Pt. instructed to call back as needed.  Follow-up by: Vivia Ewing LPN,  September 10, 7063 8:26 PM

## 2010-04-08 NOTE — Assessment & Plan Note (Signed)
Summary: F/U APPT...LSW.   History of Present Illness Visit Type: Follow-up Visit Primary GI MD: [name] MD Primary Kaetlyn Noa: Gerrit Heck, MD Requesting Conception Doebler: n/a Chief Complaint: F/u for Crohn's. PT c/o diarrhea, bloating, and lower abd pain  History of Present Illness:   This is a 50 year old white female with severe Crohn's disease of the small bowel diagnosed on a small bowel capsule endoscopy in August 2009. Her last office visit was in 09/08/09. She had normal iron studies and a CBC. She switched from Humira to Remicade and is due for the third induction infusion of August 19th. She is also on Imuran 150 mg daily, Bentyl 20 mg twice a day, prednisone 20 mg a day, B12 1,000 mcg monthly. She is status post terminal ileum resection in August 2010. Her last colonoscopy in April 2009 and again in March 2011 was normal including biopsies from the terminal ileum. A repeat small bowel capsule endoscopy in February 2011 was incomplete with the capsule being stationary at 4 hours and 44 minutes at the site of edema and erythema. A CT Scan of the abdomen in June 2011 showed circumferential thickening of the distal ileum. She was doing very well on prednisone 30 mg a day but is having more diarrhea on 20 mg a day.   GI Review of Systems    Reports abdominal pain and  bloating.     Location of  Abdominal pain: lower abdomen.    Denies acid reflux, belching, chest pain, dysphagia with liquids, dysphagia with solids, heartburn, loss of appetite, nausea, vomiting, vomiting blood, weight loss, and  weight gain.      Reports diarrhea.     Denies anal fissure, black tarry stools, change in bowel habit, constipation, diverticulosis, fecal incontinence, heme positive stool, hemorrhoids, irritable bowel syndrome, jaundice, light color stool, liver problems, rectal bleeding, and  rectal pain.    Current Medications (verified): 1)  Cyanocobalamin 1000 Mcg/ml Soln (Cyanocobalamin) .... Inject 1 Ml  Intramuscular Once Daily 2)  Zofran 4 Mg Tabs (Ondansetron Hcl) .... Take 1 Tablet By Mouth Every 6 Hours As Needed For Nausea. 3)  Omeprazole 20 Mg Tbec (Omeprazole) .... Take 1 Tablet By Mouth Once A Day 4)  Azathioprine 50 Mg Tabs (Azathioprine) .... Take 3 Tablets (150 Mg) Once Daily 5)  Promethazine Hcl 25 Mg Tabs (Promethazine Hcl) .... As Needed 6)  Bentyl 20 Mg Tabs (Dicyclomine Hcl) .... Take 1 Tablet By Mouth Two Times A Day 7)  Lomotil 2.5-0.025 Mg Tabs (Diphenoxylate-Atropine) .... Take 1 Tablet By Mouth Three Times A Day 8)  Prednisone 10 Mg Tabs (Prednisone) .... Take 4 Tablets By Mouth Once Daily 9)  Analpram-Hc 1-2.5 % Crea (Hydrocortisone Ace-Pramoxine) .... Apply To Rectum 2 Times Daily (May Give Generic If Cheaper) 10)  Remicade 100 Mg Solr (Infliximab) .... As Directed 11)  Lunesta 2 Mg Tabs (Eszopiclone) .... As Needed At Bedtime 12)  Benazepril Hcl 20 Mg Tabs (Benazepril Hcl) .... One Tablet By Mouth Once Daily  Allergies (verified): 1)  ! Codeine  Past History:  Past Medical History: Reviewed history from 02/03/2009 and no changes required. Current Problems: CROHNS DISEASE  DEPRESSION (ICD-311) HEARING LOSS (ICD-389.9) HEMORRHOIDS, HX OF (ICD-V12.79) HIATAL HERNIA (ICD-553.3) COLONIC POLYPS, ADENOMATOUS, HX OF (ICD-V12.72) GERD (ICD-530.81)  Past Surgical History: Reviewed history from 02/03/2009 and no changes required. Partial Hysterectomy S/P ILEOCECECTOMY 10/2008 Right carpal tunnel release Annal Fissure repair Cholecystectomy  Family History: Reviewed history from 11/07/2007 and no changes required. Family History of Colon  Cancer: Mother Family History of Breast Cancer:Grandmother Family History of Colitis/Crohn's:Aunts Family History of Colon Polyps::Mother, Father , Brother  Social History: Reviewed history from 11/07/2007 and no changes required. Patient is a former smoker.  Alcohol Use - yes-on occasion Occupation: [employer]  Use -1 Patient does not get regular exercise.   Review of Systems       The patient complains of allergy/sinus, arthritis/joint pain, back pain, headaches-new, muscle pains/cramps, and sleeping problems.  The patient denies anemia, anxiety-new, blood in urine, breast changes/lumps, change in vision, confusion, cough, coughing up blood, depression-new, fainting, fatigue, fever, hearing problems, heart murmur, heart rhythm changes, itching, menstrual pain, night sweats, nosebleeds, pregnancy symptoms, shortness of breath, skin rash, sore throat, swelling of feet/legs, swollen lymph glands, thirst - excessive , urination - excessive , urination changes/pain, urine leakage, vision changes, and voice change.         Pertinent positive and negative review of systems were noted in the above HPI. All other ROS was otherwise negative.   Vital Signs:  Patient profile:   50 year old female Height:      68 inches Weight:      198 pounds BMI:     30.21 BSA:     2.04 Pulse rate:   86 / minute Pulse rhythm:   regular BP sitting:   128 / 72  (left arm) Cuff size:   regular  Vitals Entered By: Hope Pigeon CMA (October 19, 2009 8:30 AM)  Physical Exam  General:  cushingoid, alert and oriented. Eyes:  nonicteric. Mouth:  normal oral mucosa. Neck:  Supple; no masses or thyromegaly. Lungs:  inspiratory and expiratory wheezes Heart:  normal S1, normal S2. Abdomen:  soft abdomen with normal active bowel sounds. Diffusely tender predominantly in right lower quadrant. No palpable mass. Liver edge at costal margin. Rectal:  normal rectal tone, normal perianal area, stool Hemoccult negative. Extremities:  No clubbing, cyanosis, edema or deformities noted. Skin:  Intact without significant lesions or rashes. Psych:  Alert and cooperative. Normal mood and affect.   Impression & Recommendations:  Problem # 1:  CROHN'S DISEASE (ICD-555.9) Patient has severe Crohn's disease of the small bowel which was well  controlled on prednisone 30 mg a day but has flared up since the prednisone was reduced to 20 mg a day. She is starting her induction regimen of Remicade which may not take affect for another several months. In the meantime, we will increase her prednisone to 25 mg daily. She will continue all other medications. She is due for B12 injections next week. I would consider adding methotrexate, consider adding Cipro Flagyl or consider increasing Remicade to 10 mg/kg.if no improvement by next appointment.  Problem # 2:  DIARRHEA (ICD-787.91) Patient has diarrhea due to Crohn's disease of the small bowel. Patient has finally stopped loosing weight.  Problem # 3:  FAMILY HX COLON CANCER (ICD-V16.0) Patient's last colonoscopy was in March 2011. Her next colonoscopy will be due in 5 years.  Patient Instructions: 1)  Increase prednisone to 25 mg daily. 2)  Continue Imuran 150 mg daily. 3)  Continue Remicade 5 mg/kg every 8 weeks; next infusion on August 19th. 4)  Continue all other medications.  5)  Office visit 8 weeks. 6)  Copy sent to : Dr Kalman Drape 7)  The medication list was reviewed and reconciled.  All changed / newly prescribed medications were explained.  A complete medication list was provided to the patient / caregiver. Prescriptions: LOMOTIL  2.5-0.025 MG TABS (DIPHENOXYLATE-ATROPINE) Take 1 tablet by mouth three times a day  #100 x 1   Entered by:   Madlyn Frankel CMA (AAMA)   Authorized by:   Lafayette Dragon MD   Signed by:   [place] (AAMA) on 10/19/2009   Method used:   Printed then faxed to ...       [facility] (retail)       [place], [place]  [address]       Ph: [phone] or [phone]       Fax: [phone]   RxID:   [id number] AZATHIOPRINE 50 MG TABS (AZATHIOPRINE) Take 3 tablets (150 mg) once daily  #90 x 2   Entered by:   Madlyn Frankel CMA (AAMA)   Authorized by:   Lafayette Dragon  MD   Signed by:   Madlyn Frankel CMA ([place]) on 10/19/2009   Method used:   Electronically to        [facility].* (retail)       [address]       [place], [place]  [address]       Ph: [phone] or [phone]       Fax: [phone]   RxID:   [id number] PREDNISONE 10 MG TABS (PREDNISONE) Take 4 tablets by mouth once daily  #100 x 1   Entered by:   Madlyn Frankel CMA ([place])   Authorized by:   Lafayette Dragon MD   Signed by:   Madlyn Frankel CMA ([place]) on 10/19/2009   Method used:   Electronically to        [facility].* (retail)       [address]       [place], [place]  [address]       Ph: [phone] or [phone]       Fax: [phone]   RxID:   [id number] ANALPRAM-HC 1-2.5 % CREA (HYDROCORTISONE ACE-PRAMOXINE) Apply to rectum 2 times daily (may give generic if cheaper)  #30 grams x 2   Entered by:   Madlyn Frankel CMA ([place])   Authorized by:   Lafayette Dragon MD   Signed by:   Madlyn Frankel CMA ([place]) on 10/19/2009   Method used:   Electronically to        [facility].* (retail)       [address]       [place], [place]  [address]       Ph: [phone] or [phone]       Fax: [phone]   RxID:   [id number] BENTYL 20 MG TABS (DICYCLOMINE HCL) Take 1 tablet by mouth two times a day  #60 x 2   Entered by:   Madlyn Frankel CMA (AAMA)   Authorized by:   Lafayette Dragon MD   Signed by:   Madlyn Frankel CMA ([place]) on 10/19/2009   Method used:   Electronically to        [facility] (retail)       [address]       [place], [place]  [address]  Ph: [phone] or [phone]       Fax: [phone]   RxID:   [id number] OMEPRAZOLE 20 MG TBEC (OMEPRAZOLE) Take 1 tablet by mouth once a day  #30 x 2   Entered by:   Madlyn Frankel CMA ([place])    Authorized by:   Lafayette Dragon MD   Signed by:   Madlyn Frankel CMA ([place]) on 10/19/2009   Method used:   Electronically to        [facility].* (retail)       [address]       [place], [place]  [address]       Ph: [phone] or [phone]       Fax: [phone]   RxID:   [id number] ZOFRAN 4 MG TABS (ONDANSETRON HCL) Take 1 tablet by mouth every 6 hours as needed for nausea.  #12 x 1   Entered by:   Madlyn Frankel CMA (AAMA)   Authorized by:   Lafayette Dragon MD   Signed by:   [place] ([place]) on 10/19/2009   Method used:   Electronically to        [facility].* (retail)       [address]       [place],   [address]       Ph: [phone] or [phone]       Fax: [phone]   RxID:   [id number]

## 2010-04-08 NOTE — Discharge Summary (Signed)
Summary: DISCHARGE SUMMARY  NAMEADIN, LARICCIA            ACCOUNT NO.:  [id number]      MEDICAL RECORD NO.:  [id number]          PATIENT TYPE:  INP      LOCATION:  7681                         FACILITY[facility]  [facility]      PHYSICIAN:  Dickey Gave, MDDATE OF BIRTH:  1960/11/26      DATE OF ADMISSION:  10/09/2008   DATE OF DISCHARGE:  10/14/2008                                  DISCHARGE SUMMARY      ADMISSION DIAGNOSES:  #1. Crohn's disease.   #2. Psoriasis.      DISCHARGE DIAGNOSES:  #1. Crohn disease.   #2. Psoriasis.      PROCEDURES PERFORMED:  On October 09, 2008 a laparoscopic assisted   ileocecectomy.      Pertinent radiologic evaluation.  She has a small bowel follow-through   that shows a persistent mild dilatation of her proximal small bowel with   marked irregularity and luminal narrowing of her terminal ileum as well   as a capsule endoscopy that shows there is a stricture in this region as   well.     She had a normal colonoscopy in April 2009.   Her laboratory evaluations were all within normal limits.      HOSPITAL COURSE:  Mrs. Mccardle is a 50 year old female with a history   of Crohn disease based on the capsule endoscopy by Dr. Olevia Perches in August   2009.  She has been maintained on azathioprine  and a high dose of   prednisone since then.  She is unable to tolerate the tapering due to   nausea and vomiting and inability to tolerate p.o. intake.  She has   undergone a recent small bowel follow-through that shows the dilatation   of her proximal small bowel and marked irregularity in luminal narrowing   of her terminal ileum.  This also is the area of pain her on focal spot   compression views.  She and I discussed a ileocecectomy.  She was taken   to the operating room on October 09, 2008 where she underwent a   laparoscopic assisted ileocecectomy that went well without complication.   She did well postoperatively.  I maintained her on the same steroid dose   and she was going to get this tapered as an outpatient by Dr. Olevia Perches.   By postop day 5, she was having bowel movements, passing gas, tolerating   her diet.  Her pain was controlled on oral medication.  She was then   discharged home.      FOLLOWUP:  #1. Dr. Donne Hazel in 2 to 3 weeks, [facility].   #2. Dr. Olevia Perches in 2 weeks to begin tapering her steroids.      MEDICATIONS UPON DISCHARGE:  #1. Vitamin B12.   #2. Ondansetron  4 mg every 6 hours as needed.   #3. Omeprazole 20 mg daily.   #4. Percocet as needed.   #5. Prednisone 10 mg 3 tablets daily.   #6. Azathioprine  150 mg daily.   #7. Promethazine 25 q. 4 as needed.   #8.  Humira every 14 days.   #9. Albuterol inhaler p.r.n.               Dickey Gave, MD   Electronically Signed            MCW/MEDQ  D:  11/12/2008  T:  11/12/2008  Job:  [id number]

## 2010-04-08 NOTE — Progress Notes (Signed)
  Phone Note Other Incoming   Request: Send information Summary of Call: Request received from [place]  forwarded to [place].

## 2010-04-08 NOTE — Op Note (Signed)
Summary: Remicade/[place]  Remicade/[place]   Imported By: Phillis Knack 12/25/2009 14:25:24  _____________________________________________________________________  External Attachment:    Type:   Image     Comment:   External Document

## 2010-04-08 NOTE — Assessment & Plan Note (Signed)
Summary: MONTHLY B12 SHOT  Nurse Visit   Allergies: 1)  ! Codeine  Medication Administration  Injection # 1:    Medication: Vit B12 1000 mcg    Diagnosis: VITAMIN B12 DEFICIENCY (ICD-266.2)    Route: IM    Site: R deltoid    Exp Date: 12/2011    Lot #: [id number]    Mfr: [employer] Regent    Patient tolerated injection without complications    Given by: Randye Lobo NCMA (March 22, 2010 8:11 AM)  Orders Added: 1)  Vit B12 1000 mcg [M1040]

## 2010-04-08 NOTE — Miscellaneous (Signed)
Summary: Consent & Scheduling / [facility]  Consent & Scheduling / [facility]   Imported By: Rise Patience 05/11/2009 14:20:25  _____________________________________________________________________  External Attachment:    Type:   Image     Comment:   External Document

## 2010-04-08 NOTE — Assessment & Plan Note (Signed)
Summary: f/u from colon--ch.   History of Present Illness Visit Type: Follow-up Visit Primary GI MD: [name] MD Primary Provider: Gerrit Heck, MD Requesting Provider: n/a Chief Complaint: Follow up from colonoscopy History of Present Illness:   This is a 50 year old white female with Crohn's disease of the small intestine diagnosed on a small bowel capsule endoscopy in August 2009. She is status post terminal ileum resection and ileocolic anastomoses in August 2010. She came to the office with an exacerbation of diarrhea, having 9-15 bowel movements a day and at night along with crampy abdominal pain. There was no vomiting but she had some nausea and increased reflux. She was unable to tolerate Questran. Her medications include Imuran 100 mg daily, Humira 40 mg and every 2 weeks. She tapered off her prednisone. Her labs from January 2011 were all normal except for a sedimentation rate of 26. A colonoscopy in [place] in April 2009 was normal. Her last upper endoscopy in 1994 showed a hiatal hernia and esophagitis. Patient had a capsule endoscopy in February 2011 which was an incomplete study. The capsule did not appear to reach the colon and there was limited visulazation beyond 4 hours and 44 minutes. There were mild inflammatory changes with some edema, erythema and a red spot in the more distal small bowel starting around 4 hours, 43 minutes. Patient had a colonoscopy on 05/21/09 which showed no polyps or cancers. Patient had a normal anastamosis with the exception of 2 polyps and she had a normal terminal ileum. There was no evidence of active Crohn's disease. Biopsies of the distal ileum showed benign small bowel mucosa with no villous atrophy, inflammation or other abnormalities. Biopsies of the ileocecal valve a the anastomosis showed inflamed colonic and small bowel type mucosa consistant with the anastomosis site. Biopsies of the polyps near the anastomosis showed tubular adenomas but there  was no high grade dysplasia identified. Random biopsies taken showed benign mucosa. Patient comes today for follow up.    GI Review of Systems      Denies abdominal pain, acid reflux, belching, bloating, chest pain, dysphagia with liquids, dysphagia with solids, heartburn, loss of appetite, nausea, vomiting, vomiting blood, weight loss, and  weight gain.        Denies anal fissure, black tarry stools, change in bowel habit, constipation, diarrhea, diverticulosis, fecal incontinence, heme positive stool, hemorrhoids, irritable bowel syndrome, jaundice, light color stool, liver problems, rectal bleeding, and  rectal pain.    Current Medications (verified): 1)  Cyanocobalamin 1000 Mcg/ml Soln (Cyanocobalamin) .... Inject 1 Ml Intramuscular Twice Monthly 2)  Humira Pen-Psoriasis Starter 40 Mg/0.73m Kit (Adalimumab) .... 871mSq Every Other Week 3)  Analpram-Hc 1-2.5 % Crea (Hydrocortisone Ace-Pramoxine) .... Apply Two Times A Day To Three Times A Day As Needed 4)  Zofran 4 Mg Tabs (Ondansetron Hcl) .... Take 1 Tablet By Mouth Every 6 Hours As Needed For Nausea. 5)  Omeprazole 20 Mg Tbec (Omeprazole) .... Take 1 Tablet By Mouth Once A Day 6)  Azathioprine 50 Mg Tabs (Azathioprine) .... Take 3 Tablets (150 Mg) Once Daily 7)  Promethazine Hcl 25 Mg Tabs (Promethazine Hcl) .... As Needed 8)  Bentyl 20 Mg Tabs (Dicyclomine Hcl) .... Take 1 Tablet By Mouth Two Times A Day 9)  Lomotil 2.5-0.025 Mg Tabs (Diphenoxylate-Atropine) .... Take 1 Tablet By Mouth Three Times A Day 10)  Prednisone 10 Mg Tabs (Prednisone) .... Take 3 Tablets By Mouth Once Daily 11)  Anucort-Hc 25 Mg Supp (Hydrocortisone Acetate) ...Marland KitchenMarland KitchenMarland Kitchen  Put 1 in Rectum Every Night At Bedtime, As Needed.  Allergies (verified): 1)  ! Codeine  Past History:  Past Medical History: Last updated: 02/03/2009 Current Problems: CROHNS DISEASE  DEPRESSION (ICD-311) HEARING LOSS (ICD-389.9) HEMORRHOIDS, HX OF (ICD-V12.79) HIATAL HERNIA  (ICD-553.3) COLONIC POLYPS, ADENOMATOUS, HX OF (ICD-V12.72) GERD (ICD-530.81)  Past Surgical History: Last updated: 02/03/2009 Partial Hysterectomy S/P ILEOCECECTOMY 10/2008 Right carpal tunnel release Annal Fissure repair Cholecystectomy  Family History: Last updated: 11/07/2007 Family History of Colon Cancer: Mother Family History of Breast Cancer:Grandmother Family History of Colitis/Crohn's:Aunts Family History of Colon Polyps::Mother, Father , Brother  Social History: Last updated: 11/07/2007 Patient is a former smoker.  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   Vital Signs:  Patient profile:   50 year old female Height:      68 inches Weight:      190 pounds BMI:     28.99 BSA:     2.00 Pulse rate:   88 / minute Pulse rhythm:   regular BP sitting:   144 / 88  (left arm)  Vitals Entered By: [place] Deborra Medina) (June 29, 2009 3:25 PM)  Physical Exam  General:  Well developed, well nourished, no acute distress. Eyes:  PERRLA, no icterus. Mouth:  No deformity or lesions, dentition normal. Neck:  Supple; no masses or thyromegaly. Lungs:  Clear throughout to auscultation. Heart:  Regular rate and rhythm; no murmurs, rubs,  or bruits. Abdomen:  well-healed surgical scar. No tenderness. Normoactive bowel sounds. Rectal:  anoscopic exam reveals an anal fissure at 3:00. Anal canal consistent with Crohn's disease. There was no pain. Stool is Hemoccult negative. Extremities:  No clubbing, cyanosis, edema or deformities noted. Skin:  Intact without significant lesions or rashes. Psych:  Alert and cooperative. Normal mood and affect.   Impression & Recommendations:  Problem # 1:  CROHN'S DISEASE (ICD-555.9) Crohn's disease of the colon terminal ileum. Status post terminal ileal resection almost one year ago. She is still doing reasonably well on prednisone taper. Humira [place] [facility]  increased to 80 mg every 2 weeks, Imuran 150 mg a day and mesalamine. Her rectal bleeding is of anal rectal origin G-tube without anal fissure. We will start Analpram cream twice a day.  Problem # 2:  ABDOMINAL PAIN, LEFT LOWER QUADRANT (ICD-789.04) abdominal exam today is quite unremarkable  Problem # 3:  VITAMIN B12 DEFICIENCY (ICD-266.2)  Ordcontinue B. 12,000 mcg IM monthlyers: Vit B12 1000 mcg (J3420)  Problem # 4:  COLONIC POLYPS, ADENOMATOUS, HX OF (ICD-V12.72) status post recent colonoscopy with findings of adenomatous polyps. Recall colonoscopy 5 years  Patient Instructions: 1)  Begin Analpram two times a day for rectal irritation. 2)  Continue all other medications including Anusol suppositories. 3)  Vitamin B12 injections 1,000 mcg IM monthly. We will give 1 injection today. 4)  Copy sent to : Dr. Gerrit Heck 5)  The medication list was reviewed and reconciled.  All changed / newly prescribed medications were explained.  A complete medication list was provided to the patient / caregiver. Prescriptions: ANALPRAM-HC 1-2.5 % CREA (HYDROCORTISONE ACE-PRAMOXINE) Apply to rectum 2 times daily (may give generic if cheaper)  #30 grams x 1   Entered by:   Madlyn Frankel CMA (AAMA)   Authorized by:   Lafayette Dragon MD   Signed by:   [place] ([place]) on 06/29/2009   Method used:   Electronically to  [facility] (retail)       [address]       [place], [place]  [address]       Ph: [phone] or [phone]       Fax: [phone]   RxID:   [id number]    Medication Administration  Injection # 1:    Medication: Vit B12 1000 mcg    Diagnosis: VITAMIN B12 DEFICIENCY (ICD-266.2)    Route: IM    Site: L deltoid    Exp Date: 2/13    Lot #: [id number]    Mfr: [employer] Regent    Patient tolerated injection without complications    Given by: Madlyn Frankel CMA Deborra Medina) (June 29, 2009 4:06 PM)  Orders  Added: 1)  Vit B12 1000 mcg [E2800]

## 2010-04-08 NOTE — Assessment & Plan Note (Signed)
Summary: MONTHLY B12 SHOT...LSW.  Nurse Visit   Allergies: 1)  ! Codeine  Medication Administration  Injection # 1:    Medication: Vit B12 1000 mcg    Diagnosis: VITAMIN B12 DEFICIENCY (ICD-266.2)    Route: IM    Site: R deltoid    Exp Date: 07/2011    Lot #: [id number]    Mfr: [place]    Patient tolerated injection without complications    Given by: Marlon Pel CMA Deborra Medina) (September 30, 2009 9:17 AM)  Orders Added: 1)  Vit B12 1000 mcg [L9747]

## 2010-04-08 NOTE — Assessment & Plan Note (Signed)
Summary: 2 MO F/U.AM.   History of Present Illness Visit Type: Follow-up Visit Primary GI MD: [name] MD Primary Provider: Gerrit Heck, MD Requesting Provider: n/a Chief Complaint: Some bouts of diarrhea every 3-4 days, otherwise Gi symptoms subsided; Patient having daily cramping in legs; also needs b12 inj. History of Present Illness:   This is a 50 year old white female with complicated Crohn's disease diagnosed in August 2009 on a small bowel capsule endoscopy. She has been on maximum medical therapy and recently switched from Humira to Remicade. She has so far had her induction dose and the first regular dose and feels better. A CT Scan of the abdomen in June 2010 showed circumferential thickening of the terminal ileum. Her most recent small bowel capsule endoscopy in February 2011 was incomplete with delay in the passage of the capsule at 4 hours at an ulcerated area. Her TPMP activity has been normal. She continues Imuran 150 mg a day, B12 monthly Bentyl 20 mg twice a day and prednisone 25 mg a day. Her last colonoscopy in March 2011 for a family history of colon cancer in her mother showed a tubular adenoma. Her terminal ileum appeared normal.   GI Review of Systems    Reports abdominal pain.     Location of  Abdominal pain: RUQ.    Denies acid reflux, belching, bloating, chest pain, dysphagia with liquids, dysphagia with solids, heartburn, loss of appetite, nausea, vomiting, vomiting blood, weight loss, and  weight gain.      Reports diarrhea.     Denies anal fissure, black tarry stools, change in bowel habit, constipation, diverticulosis, fecal incontinence, heme positive stool, hemorrhoids, irritable bowel syndrome, jaundice, light color stool, liver problems, rectal bleeding, and  rectal pain.    Current Medications (verified): 1)  Cyanocobalamin 1000 Mcg/ml Soln (Cyanocobalamin) .... Inject 1 Ml Intramuscular Once Monthly 2)  Zofran 4 Mg Tabs (Ondansetron Hcl) .... Take 1  Tablet By Mouth Every 6 Hours As Needed For Nausea. 3)  Omeprazole 20 Mg Tbec (Omeprazole) .... Take 1 Tablet By Mouth Once A Day 4)  Azathioprine 50 Mg Tabs (Azathioprine) .... Take 3 Tablets (150 Mg) Once Daily 5)  Promethazine Hcl 25 Mg Tabs (Promethazine Hcl) .... As Needed 6)  Bentyl 20 Mg Tabs (Dicyclomine Hcl) .... Take 1 Tablet By Mouth Two Times A Day 7)  Lomotil 2.5-0.025 Mg Tabs (Diphenoxylate-Atropine) .... Take 1 Tablet By Mouth Three Times A Day 8)  Prednisone 10 Mg Tabs (Prednisone) .... Take 4 Tablets By Mouth Once Daily 9)  Analpram-Hc 1-2.5 % Crea (Hydrocortisone Ace-Pramoxine) .... Apply To Rectum 2 Times Daily (May Give Generic If Cheaper) 10)  Remicade 100 Mg Solr (Infliximab) .... As Directed 11)  Lunesta 2 Mg Tabs (Eszopiclone) .... As Needed At Bedtime 12)  Benazepril Hcl 20 Mg Tabs (Benazepril Hcl) .... One Tablet By Mouth Once Daily 13)  Symbicort 80-4.5 Mcg/act Aero (Budesonide-Formoterol Fumarate) .... 2 Puffs Twice Daily 14)  Fluticasone Propionate 50 Mcg/act Susp (Fluticasone Propionate) .... Two Times A Day 15)  Proair Hfa 108 (90 Base) Mcg/act Aers (Albuterol Sulfate) .... 2 Puffs As Needed 16)  Nystatin 100000 Unit/ml Susp (Nystatin) .... Swish and Spit Four Times A Day  Allergies (verified): 1)  ! Codeine  Past History:  Past Medical History: Reviewed history from 02/03/2009 and no changes required. Current Problems: CROHNS DISEASE  DEPRESSION (ICD-311) HEARING LOSS (ICD-389.9) HEMORRHOIDS, HX OF (ICD-V12.79) HIATAL HERNIA (ICD-553.3) COLONIC POLYPS, ADENOMATOUS, HX OF (ICD-V12.72) GERD (ICD-530.81)  Past Surgical History:  Reviewed history from 02/03/2009 and no changes required. Partial Hysterectomy S/P ILEOCECECTOMY 10/2008 Right carpal tunnel release Annal Fissure repair Cholecystectomy  Family History: Reviewed history from 11/07/2007 and no changes required. Family History of Colon Cancer: Mother Family History of Breast  Cancer:Grandmother Family History of Colitis/Crohn's:Aunts Family History of Colon Polyps::Mother, Father , Brother  Social History: Reviewed history from 11/07/2007 and no changes required. Patient is a former smoker.  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   Review of Systems       The patient complains of fatigue and muscle pains/cramps.  The patient denies allergy/sinus, anemia, anxiety-new, arthritis/joint pain, back pain, blood in urine, breast changes/lumps, change in vision, confusion, cough, coughing up blood, depression-new, fainting, fever, headaches-new, hearing problems, heart murmur, heart rhythm changes, itching, menstrual pain, night sweats, nosebleeds, pregnancy symptoms, shortness of breath, skin rash, sleeping problems, sore throat, swelling of feet/legs, swollen lymph glands, thirst - excessive , urination - excessive , urination changes/pain, urine leakage, vision changes, and voice change.         Pertinent positive and negative review of systems were noted in the above HPI. All other ROS was otherwise negative.   Vital Signs:  Patient profile:   50 year old female Height:      68 inches Weight:      209.13 pounds BMI:     31.91 Pulse rate:   80 / minute Pulse rhythm:   regular BP sitting:   168 / 92  (left arm) Cuff size:   regular  Vitals Entered By: June McMurray [place] Deborra Medina) (December 23, 2009 8:32 AM)  Physical Exam  General:  cushingoid alert and oriented Eyes:  nonicteric Mouth:  cheilosis present, tongue papillated Neck:  Supple; no masses or thyromegaly. Lungs:  Clear throughout to auscultation. Heart:  Regular rate and rhythm; no murmurs, rubs,  or bruits. Abdomen:  mild truncal obesity. Normal active to hyperactive bowel sounds. Tenderness left lower quadrant and right lower quadrant as well as epigastrium Rectal:  normal perianal area soft trace Hemoccult-positive  stool Extremities:  No clubbing, cyanosis, edema or deformities noted. Psych:  Alert and cooperative. Normal mood and affect.   Impression & Recommendations:  Problem # 1:  FAMILY HX COLON CANCER (ICD-V16.0) adenomatous polyp in March 2011. Recall colonoscopy March 2016  Problem # 2:  CROHN'S DISEASE (ICD-555.9) Crohn's disease of the small bowel. On maximum medical therapy with Remicade, azathioprine, prednisone taper down to 20 mg a day, Bentyl 20 mg twice a day Orders: TLB-CBC Platelet - w/Differential (85025-CBCD) TLB-Sedimentation Rate (ESR) (85652-ESR) TLB-Renal Function Panel (80069-RENAL) TLB-Magnesium (Mg) (83735-MG)  Problem # 3:  ABDOMINAL PAIN, LEFT LOWER QUADRANT (ICD-789.04) abdominal pain related to Crohn's  disease is actually controlled with Bentyl. Patient has been on no premedications  Other Orders: Vit B12 1000 mcg (A5409)  Patient Instructions: 1)  Please pick up your prescription for Phenergan at the pharmacy. 2)  We have sent prednisone 10 mg tablets to your pharmacy for you to take as directed by Dr Olevia Perches. Decrease your prednisone to 20 mg daily. 3)  Pick up Diflucan at the pharmacy. You should take 1 tablet daily x 6 days. 4)  Please go to the lab today for a CBC, sedimentation rate, renal profile and magnesium level. 5)  You have been given your B12 injection today. 6)  Office visit 2 months 7)  Copy sent to : Gerrit Heck, MD 8)  The  medication list was reviewed and reconciled.  All changed / newly prescribed medications were explained.  A complete medication list was provided to the patient / caregiver. Prescriptions: PREDNISONE 10 MG TABS (PREDNISONE) Take as directed.  #100 x 0   Entered by:   Madlyn Frankel CMA (AAMA)   Authorized by:   Lafayette Dragon MD   Signed by:   Madlyn Frankel CMA ([place]) on 12/23/2009   Method used:   Electronically to        [facility] (retail)       [address]        [place], [place]  [address]       Ph: [phone] or [phone]       Fax: [phone]   RxID:   [id number] PROMETHAZINE HCL 25 MG TABS (PROMETHAZINE HCL) Take 1 tablet by mouth every 4-6 hours as needed for nausea  #30 x 1   Entered by:   Madlyn Frankel CMA (AAMA)   Authorized by:   Lafayette Dragon MD   Signed by:   Madlyn Frankel CMA ([place]) on 12/23/2009   Method used:   Electronically to        [facility] (retail)       [address]       [place], [place]  [address]       Ph: [phone] or [phone]       Fax: [phone]   RxID:   [id number] DIFLUCAN 100 MG TABS (FLUCONAZOLE) Take 1 tablet by mouth once a day x 6 days  #6 x 0   Entered by:   Madlyn Frankel CMA (AAMA)   Authorized by:   Lafayette Dragon MD   Signed by:   Madlyn Frankel CMA ([place]) on 12/23/2009   Method used:   Electronically to        [facility] (retail)       [address]       [place], [place]  [address]       Ph: [phone] or [phone]       Fax: [phone]   RxID:   [id number]    Medication Administration  Injection # 1:    Medication: Vit B12 1000 mcg    Diagnosis: VITAMIN B12 DEFICIENCY (ICD-266.2)    Route: IM    Site: R deltoid    Exp Date: 7/13    Lot #: [id number]    Mfr: [employer]    Patient tolerated injection without complications    Given by: Madlyn Frankel CMA (AAMA) (December 23, 2009 9:26 AM)  Orders Added: 1)  Vit B12 1000 mcg [J3420] 2)  TLB-CBC Platelet - w/Differential [85025-CBCD] 3)  TLB-Sedimentation Rate (ESR) [85652-ESR] 4)  TLB-Renal Function Panel [80069-RENAL] 5)  TLB-Magnesium (Mg) [83735-MG]

## 2010-04-08 NOTE — Progress Notes (Signed)
Summary: triage  Phone Note Call from Patient Call back at Home Phone [phone]   Caller: Patient Call For: Dr. Olevia Perches Reason for Call: Talk to Nurse Summary of Call: woke up this morning with severe abd pain and loose stools Initial call taken by: Lucien Mons,  October 28, 2009 11:08 AM  Follow-up for Phone Call        patient with a hx of crohn's and has been on prednisone 25 mg with no problems in the last week, 2nd Remicade induction infusion was last Friday .  She c/o sudden severe RUQ pain that started this am with 8-9 loose watery stools already this am.  Denies rectal bleeding, fever, nausea, or vomiting.   Discussed with Dr Fuller Plan (DOD) she will come in today and see Nicoletta Ba PA at 3:00 Follow-up by: Barb Merino RN, CGRN,  October 28, 2009 12:27 PM     Appended Document: triage Patient was a no -show for the appointment yesterday afternoon with Nicoletta Ba PA .  I spoke with the patient's husband today  and he states she is a "little better"  patient is at work now.  I have asked her husband to have her call me with an update on her symptoms.  Appended Document: triage I discussed with Dr Olevia Perches today, if the patient calls back and is still having pain she should get Flagyl 250 mg three times a day for 7 days.  I will await a return call from the patient

## 2010-04-08 NOTE — Letter (Signed)
Summary: Patient Notice- Polyp Results  [facility]  [place], [place] [address]   Phone: [phone]  Fax: [phone]        May 25, 2009 MRN: [id number]    [facility] [address] [place], [place]  [address]    Dear [name],  I am pleased to inform you that the colon polyp(s) removed during your recent colonoscopy was (were) found to be benign (no cancer detected) upon pathologic examination.Your polyp was adenomatous ( precancerous). The rest of thr biopsies showed no evidence of Crohn's disease in Your colon.  I recommend you have a repeat colonoscopy examination in 5x_ years to look for recurrent polyps, as having colon polyps increases your risk for having recurrent polyps or even colon cancer in the future.  Should you develop new or worsening symptoms of abdominal pain, bowel habit changes or bleeding from the rectum or bowels, please schedule an evaluation with either your primary care physician or with me.  Additional information/recommendations:  __ No further action with gastroenterology is needed at this time. Please      follow-up with your primary care physician for your other healthcare      needs.  __x Please call [phone] to schedule a return visit to review your      situation.  __ Please keep your follow-up visit as already scheduled.  _x_ Continue treatment plan as outlined the day of your exam.  Please call us if you are having persistent problems or have questions about your condition that have not been fully answered at this time.  Sincerely,  Lafayette Dragon MD  This letter has been electronically signed by your physician.  Appended Document: Patient Notice- Polyp Results Letter mailed 3.23.11

## 2010-04-08 NOTE — Progress Notes (Signed)
Summary: Flexeril Rx  ---- Converted from flag ---- ---- 03/02/2010 10:02 AM, Carla Drape Nelson-Smith CMA (AAMA) wrote: I have a refill request for patient to get more flexeril. You gave it to her on 12/23/09 at bedtime as needed for cramps.Marland KitchenMarland KitchenMarland KitchenDo you want me to continue refilling or was this a temporary thing? ------------------------------ ---- 03/03/2010 9:28 PM, Lafayette Dragon MD wrote: OK to continue to refill. She has cramps caused by a Prednisone taper.  Prescriptions: FLEXERIL 10 MG TABS (CYCLOBENZAPRINE HCL) Take one by mouth q hs as needed cramps  #30 x 1   Entered by:   Madlyn Frankel CMA (AAMA)   Authorized by:   Lafayette Dragon MD   Signed by:   [place] ([place]) on 03/04/2010   Method used:   Electronically to        [facility] (retail)       [address]       [place], [place]  [address]       Ph: [phone] or [phone]       Fax: [phone]   RxID:   [id number]

## 2010-04-08 NOTE — Miscellaneous (Signed)
Summary: Humira Dose update  Clinical Lists Changes  Medications: Changed medication from HUMIRA PEN-PSORIASIS STARTER 40 MG/0.8ML KIT (ADALIMUMAB) 55m humira every other week to HUMIRA PEN-PSORIASIS STARTER 40 MG/0.8ML KIT (ADALIMUMAB) 85mSQ every other week

## 2010-04-08 NOTE — Progress Notes (Signed)
Summary: TRIAGE-blood in stool  Phone Note Call from Patient Call back at [phone]   Caller: Patient Call For: Dr. Olevia Perches Reason for Call: Talk to Nurse Summary of Call: pt reports that every time she's had a BM today, she has also passed bright red blood... also having some "discomfort in lower stomach" Initial call taken by: Lucien Mons,  June 24, 2009 3:50 PM  Follow-up for Phone Call        Pt. has Crohn's.  Last OV 05-18-09, last Colon 05-21-09.  She has had 5 loose BM's today, each one had BRB in the toilet and on the tissue. Mild lower abd. cramping & some nausea. Denies rectal pain, burning, itching, fever. She takes Bentyl 33m two times a day, Humira 891mevery other week, Prednisone 204maily and Imuran 150m70mily.   1) Full liquids x24 hours. Advance to soft,bland diet x2-4 days. Advanced as tolerated. 2) Continue above meds 3) If symptoms become worse call back immediately. 4) I will call pt., with new orders, after MD reviews.  DR.Joni Norrod PLEASE ADVISE    Follow-up by: DeboVivia Ewing,  April 20, 201130052 PM  Additional Follow-up for Phone Call Additional follow up Details #1::        Begin Proctocort 25 mg supp, # 20, insert 1 at bedtime and let it dissolve,1 refill Additional Follow-up by: DoraLafayette Dragon  June 24, 2009 11:03 PM    Additional Follow-up for Phone Call Additional follow up Details #2::    Above MD orders reviewed with patient. Pt. reports no blood with BM this morning. Pt. to keep scheduled office visit. Pt. instructed to call back as needed.  Follow-up by: DeboVivia Ewing,  April 21, 201111027 AM  New/Updated Medications: ANUCORT-HC 25 MG SUPP (HYDROCORTISONE ACETATE) Put 1 in rectum every night at bedtime, as needed. Prescriptions: ANUCORT-HC 25 MG SUPP (HYDROCORTISONE ACETATE) Put 1 in rectum every night at bedtime, as needed.  #20 x 1   Entered by:   DeboVivia Ewing   Authorized by:   DoraLafayette Dragon  Signed by:    DeboVivia Ewing on 04/211/17/3567ethod used:   Electronically to        [facility]       [address]   [place]  [id number]   Ph: [phone]   Fax: [phone]:   [id number]

## 2010-04-08 NOTE — Op Note (Signed)
Summary: Remicade/[place]  Remicade/[place]   Imported By: Phillis Knack 02/25/2010 12:22:54  _____________________________________________________________________  External Attachment:    Type:   Image     Comment:   External Document

## 2010-04-08 NOTE — Miscellaneous (Signed)
Summary: PPD TEST  Clinical Lists Changes  Observations: Added new observation of TB-PPD: 10/26/09 (10/26/2009 13:43)

## 2010-04-09 ENCOUNTER — Encounter (HOSPITAL_COMMUNITY): Payer: PRIVATE HEALTH INSURANCE | Attending: Internal Medicine

## 2010-04-09 ENCOUNTER — Encounter: Payer: Self-pay | Admitting: Internal Medicine

## 2010-04-09 DIAGNOSIS — Z9049 Acquired absence of other specified parts of digestive tract: Secondary | ICD-10-CM | POA: Insufficient documentation

## 2010-04-09 DIAGNOSIS — Z79899 Other long term (current) drug therapy: Secondary | ICD-10-CM | POA: Insufficient documentation

## 2010-04-09 DIAGNOSIS — K5000 Crohn's disease of small intestine without complications: Secondary | ICD-10-CM | POA: Insufficient documentation

## 2010-04-09 NOTE — Op Note (Signed)
Summary: Remicade/[facility]  Remicade/[place]   Imported By: Phillis Knack 11/03/2009 09:06:06  _____________________________________________________________________  External Attachment:    Type:   Image     Comment:   External Document

## 2010-04-09 NOTE — Op Note (Signed)
Summary: [facility]    [facility]    Imported By: Rise Patience 10/09/2009 14:12:24  _____________________________________________________________________  External Attachment:    Type:   Image     Comment:   External Document

## 2010-04-19 ENCOUNTER — Other Ambulatory Visit: Payer: Self-pay | Admitting: Internal Medicine

## 2010-04-19 ENCOUNTER — Ambulatory Visit (INDEPENDENT_AMBULATORY_CARE_PROVIDER_SITE_OTHER): Payer: PRIVATE HEALTH INSURANCE | Admitting: Internal Medicine

## 2010-04-19 ENCOUNTER — Encounter: Payer: Self-pay | Admitting: Internal Medicine

## 2010-04-19 DIAGNOSIS — K5010 Crohn's disease of large intestine without complications: Secondary | ICD-10-CM

## 2010-04-19 DIAGNOSIS — K5090 Crohn's disease, unspecified, without complications: Secondary | ICD-10-CM

## 2010-04-19 DIAGNOSIS — R197 Diarrhea, unspecified: Secondary | ICD-10-CM

## 2010-04-21 ENCOUNTER — Ambulatory Visit (INDEPENDENT_AMBULATORY_CARE_PROVIDER_SITE_OTHER)
Admission: RE | Admit: 2010-04-21 | Discharge: 2010-04-21 | Disposition: A | Discharge status: Home / Self Care | Payer: PRIVATE HEALTH INSURANCE | Source: Ambulatory Visit | Attending: Internal Medicine | Admitting: Internal Medicine

## 2010-04-21 DIAGNOSIS — K5090 Crohn's disease, unspecified, without complications: Secondary | ICD-10-CM

## 2010-04-21 MED ORDER — IOHEXOL 300 MG/ML  SOLN
100.0000 mL | Freq: Once | INTRAMUSCULAR | Status: AC | PRN
  Administered 2010-04-21: 100 mL via INTRAVENOUS

## 2010-04-23 ENCOUNTER — Ambulatory Visit: Payer: PRIVATE HEALTH INSURANCE | Admitting: Pulmonary Disease

## 2010-04-28 NOTE — Assessment & Plan Note (Signed)
Summary: F/U CROHNS--CH & B12 SHOT LSW SCHED WITH PT, MEDLIST, CX POLI...   Vital Signs:  Patient profile:   50 year old female Height:      68 inches Pulse rate:   100 / minute Pulse rhythm:   regular BP sitting:   138 / 76  (right arm) Cuff size:   regular  History of Present Illness Primary GI MD: [name] MD Primary Provider: Gerrit Heck, MD Requesting Provider: n/a Chief Complaint: Starting last Saturday pt had sharp RLQ and across lower abd pains. Pt had had N/V/D. Pt states she has not vomited since that one episode on Saturday but still has nausea and diarrhea. Pt has atleast 12 BM's a day and in the middle of the night. History of Present Illness:   This is a 50 year old white female with severe Crohn's disease of the small bowel and colon. She is status post terminal ileal resection in August 2010 with recurrence of the disease at the anastomosis. The last CT scan of the abdomen in August 2011 showed recurrence of the disease. She is having severe diarrhea as well as difficulty tapering off her prednisone. She is currently down to 12.5 mg daily but is having 8-10 loose bowel movements a day. Remicade has been infused every 8 weeks at 5 mg/kg. She has also been on Imuran 150 mg daily. Her weight has decreased by about 10 pounds. She has nausea and vomiting. She is on Lomotil 3 tablets a day.   GI Review of Systems    Reports abdominal pain, belching, bloating, loss of appetite, nausea, and  vomiting.     Location of  Abdominal pain: RLQ and lower abd.    Denies acid reflux, chest pain, dysphagia with liquids, dysphagia with solids, heartburn, vomiting blood, weight loss, and  weight gain.      Reports diarrhea and  rectal bleeding.     Denies anal fissure, black tarry stools, change in bowel habit, constipation, diverticulosis, fecal incontinence, heme positive stool, hemorrhoids, irritable bowel syndrome, jaundice, light color stool, liver problems, and  rectal  pain.  Current Medications (verified): 1)  Cyanocobalamin 1000 Mcg/ml Soln (Cyanocobalamin) .... Inject 1 Ml Intramuscular Once Monthly 2)  Zofran 4 Mg Tabs (Ondansetron Hcl) .... Take 1 Tablet By Mouth Every 6 Hours As Needed For Nausea. 3)  Omeprazole 20 Mg Tbec (Omeprazole) .... Take 1 Tablet By Mouth Once A Day 4)  Azathioprine 50 Mg Tabs (Azathioprine) .... Take 3 Tablets (150 Mg) Once Daily 5)  Promethazine Hcl 25 Mg Tabs (Promethazine Hcl) .... Take 1 Tablet By Mouth Every 4-6 Hours As Needed For Nausea 6)  Bentyl 20 Mg Tabs (Dicyclomine Hcl) .... Take 1 Tablet By Mouth Two Times A Day 7)  Lomotil 2.5-0.025 Mg Tabs (Diphenoxylate-Atropine) .... Take 1 Tablet By Mouth Three Times A Day 8)  Prednisone 10 Mg Tabs (Prednisone) .Marland KitchenMarland KitchenMarland Kitchen 35m Daily 9)  Analpram-Hc 1-2.5 % Crea (Hydrocortisone Ace-Pramoxine) .... Apply To Rectum 2 Times Daily (May Give Generic If Cheaper) 10)  Remicade 100 Mg Solr (Infliximab) .... 5 Mg/kg Every 8 Weeks 11)  Lunesta 2 Mg Tabs (Eszopiclone) .... As Needed At Bedtime 12)  Symbicort 80-4.5 Mcg/act Aero (Budesonide-Formoterol Fumarate) .... 2 Puffs Twice Daily 13)  Fluticasone Propionate 50 Mcg/act Susp (Fluticasone Propionate) .... Two Times A Day 14)  Proair Hfa 108 (90 Base) Mcg/act Aers (Albuterol Sulfate) .... 2 Puffs As Needed 15)  Flexeril 10 Mg Tabs (Cyclobenzaprine Hcl) .... Take One By Mouth Q Hs As  Needed Cramps 16)  Diflucan 100 Mg Tabs (Fluconazole) .... Take 1 Tablet Once Per Week X 12 Weeks 17)  Amlodipine Besylate 10 Mg Tabs (Amlodipine Besylate) .... Once Daily 18)  Benzonatate 200 Mg Caps (Benzonatate) .... Three Times A Day 19)  Terazosin Hcl 1 Mg Caps (Terazosin Hcl) .... One Tablet By Mouth At Bedtime  Allergies (verified): 1)  ! Codeine  Past History:  Past Medical History: Reviewed history from 02/03/2009 and no changes required. Current Problems: CROHNS DISEASE  DEPRESSION (ICD-311) HEARING LOSS (ICD-389.9) HEMORRHOIDS, HX OF  (ICD-V12.79) HIATAL HERNIA (ICD-553.3) COLONIC POLYPS, ADENOMATOUS, HX OF (ICD-V12.72) GERD (ICD-530.81)  Past Surgical History: Reviewed history from 04/02/2010 and no changes required. Partial Hysterectomy S/P ILEOCECECTOMY 10/2008 Right carpal tunnel release Annal Fissure repair Cholecystectomy Appendectomy  Family History: Reviewed history from 11/07/2007 and no changes required. Family History of Colon Cancer: Mother Family History of Breast Cancer:Grandmother Family History of Colitis/Crohn's:Aunts Family History of Colon Polyps::Mother, Father , Brother  Social History: Reviewed history from 04/02/2010 and no changes required. Patient is a former smoker.-stopped around age 50  Alcohol Use - yes-rare Occupation: Cook Daily Caffeine Use -1 Patient does not get regular exercise.  Married to [employer]  Review of Systems       The patient complains of headaches-new.  The patient denies allergy/sinus, anemia, anxiety-new, arthritis/joint pain, back pain, blood in urine, breast changes/lumps, change in vision, confusion, cough, coughing up blood, depression-new, fainting, fatigue, fever, hearing problems, heart murmur, heart rhythm changes, itching, menstrual pain, muscle pains/cramps, night sweats, nosebleeds, pregnancy symptoms, shortness of breath, skin rash, sleeping problems, sore throat, swelling of feet/legs, swollen lymph glands, thirst - excessive , urination - excessive , urination changes/pain, urine leakage, vision changes, and voice change.         Pertinent positive and negative review of systems were noted in the above HPI. All other ROS was otherwise negative.   Physical Exam  General:  Alert and oriented. Eyes:  nonicteric. Mouth:  no aphthous ulcers. Neck:  normal thyroid. Lungs:  Clear throughout to auscultation. Heart:  Regular rate and rhythm; no murmurs, rubs,  or bruits. Abdomen:  protuberant abdomen with tenderness along the transverse and  right colon. No palpable mass or rebound. Normoactive bowel sounds. No ascites. Well-healed surgical scar. Rectal:  Hemoccult negative stool today. Extremities:  No clubbing, cyanosis, edema or deformities noted. Skin:  Intact without significant lesions or rashes. Psych:  Alert and cooperative. Normal mood and affect.   Impression & Recommendations:  Problem # 1:  CROHN'S DISEASE (ICD-555.9)  Patient has severe Crohn's disease refractory to immunomodulators, biologicals and prednisone. We will have to increase her Remicade to 10 mg/kg dose and continue to taper off her steroids because of the high risk of fracture on the steroids. We will repeat a CT scan of the abdomen to assess the activity at the terminal ileum. Her last capsule endoscopy about year ago showed retention of the capsule at the distal ileum. She will continue on Imuran 150 mg daily and continue the prednisone taper every 2 weeks.  Orders: CT Abdomen/Pelvis with Contrast (CT Abd/Pelvis w/con) Remicade Infusion (Remicade)  Problem # 2:  DIARRHEA (ICD-787.91) Patient has diarrhea due to Crohn's disease.  Patient Instructions: 1)  You have been scheduled for a CT abdomen/Pelvis with contrast at [facility] on 04/21/10. Please follow written instructions given to you today. 2)  We have changed your Remicade dosage to 10 mg/kg every 8 weeks and will check with your  insurance company to make sure this is okay. 3)  We have given you a jury duty exusal letter today. 4)  Copy sent to : Dr Rocky Link 5)  The medication list was reviewed and reconciled.  All changed / newly prescribed medications were explained.  A complete medication list was provided to the patient / caregiver.   Orders Added: 1)  CT Abdomen/Pelvis with Contrast [CT Abd/Pelvis w/con] 2)  Remicade Infusion [Remicade]  Appended Document: F/U CROHNS--CH & B12 SHOT LSW SCHED WITH PT, MEDLIST, CX POLI... Spoke to staff member @ [facility] to advise them  that originally, Dr Olevia Perches wanted to change patient's Remicade to 5 mg/kg every 7 weeks instead of every 8 weeks. However, [facility] continued to schedule remicade every 8 weeks until they heard back from insurance. In the interm, patient was seen in the office and Dr Olevia Perches has now decided to change patient dosage only. We will increase dosage to 10 mg/kg every 8 weeks. Advised [facility] that insurance has already approved this change in dosage. I have faxed over updated orders to [facility] @ 2:10 pm today as well. Left message for patient that insurance has approved change in Remicade dosage and that she should keep her scheduled Remicade appointment.

## 2010-04-28 NOTE — Op Note (Signed)
Summary: Remicade/[facility]  Remicade/[place]   Imported By: Phillis Knack 04/22/2010 14:05:38  _____________________________________________________________________  External Attachment:    Type:   Image     Comment:   External Document

## 2010-04-28 NOTE — Letter (Signed)
Summary: [facility]  [address] [place], [place] [address]   Phone: [phone]  Fax: [phone]              04/19/2010  RE: [name]     [address]     [place], [place]  [address]  FILE # [phone]   To Whom It May Concern,  [name], date of birth 03/16/1960 is a patient who has been under my care for several years for a medical condition involving the colon. Unfortunately, her disease can cause severe diarrhea and therefore render her unable to sit for extended periods of time without access to a restroom.   Due to these circumstances, I feel that it would be best that Ms. Selleck be excused for jury duty at this time. I appreciate your consideration of this case and will be available at my office number, [phone].  Sincerely,     Lowella Bandy. Olevia Perches, MD

## 2010-04-28 NOTE — Op Note (Signed)
Summary: Remicade/[facility]  Remicade/[facility]   Imported By: Phillis Knack 04/22/2010 14:01:50  _____________________________________________________________________  External Attachment:    Type:   Image     Comment:   External Document

## 2010-05-17 ENCOUNTER — Ambulatory Visit: Payer: PRIVATE HEALTH INSURANCE | Admitting: Pulmonary Disease

## 2010-05-23 LAB — BASIC METABOLIC PANEL
GFR calc non Af Amer: >60 mL/min (ref >60)
Glucose, Bld: 135 mg/dL — ABNORMAL HIGH (ref 70–99)
Potassium: 3.8 mEq/L (ref 3.5–5.1)
Sodium: 138 mEq/L (ref 135–145)

## 2010-05-23 LAB — URINE MICROSCOPIC-ADD ON

## 2010-05-23 LAB — COMPREHENSIVE METABOLIC PANEL
ALT: 10 U/L (ref 0–35)
AST: 32 U/L (ref 0–37)
Albumin: 4.3 g/dL (ref 3.5–5.2)
Calcium: 9.5 mg/dL (ref 8.4–10.5)
GFR calc Af Amer: >60 mL/min (ref >60)
Glucose, Bld: 106 mg/dL — ABNORMAL HIGH (ref 70–99)
Sodium: 139 mEq/L (ref 135–145)
Total Protein: 7.4 g/dL (ref 6.0–8.3)

## 2010-05-23 LAB — CLOSTRIDIUM DIFFICILE EIA
C difficile Toxins A+B, EIA: NEGATIVE
C difficile Toxins A+B, EIA: NEGATIVE
C difficile Toxins A+B, EIA: NEGATIVE

## 2010-05-23 LAB — DIFFERENTIAL
Eosinophils Absolute: 0.1 10*3/uL (ref 0.0–0.7)
Eosinophils Relative: 0 % (ref 0–5)
Lymphocytes Relative: 12 % (ref 12–46)
Lymphs Abs: 0.7 10*3/uL (ref 0.7–4.0)
Lymphs Abs: 3.1 10*3/uL (ref 0.7–4.0)
Monocytes Absolute: 0.5 10*3/uL (ref 0.1–1.0)
Monocytes Absolute: 1 10*3/uL (ref 0.1–1.0)
Monocytes Relative: 8 % (ref 3–12)
Monocytes Relative: 8 % (ref 3–12)
Neutrophils Relative %: 65 % (ref 43–77)

## 2010-05-23 LAB — HEMOCCULT GUIAC POC 1CARD (OFFICE): Fecal Occult Bld: NEGATIVE

## 2010-05-23 LAB — CBC
HCT: 34 % — ABNORMAL LOW (ref 36.0–46.0)
Hemoglobin: 11.9 g/dL — ABNORMAL LOW (ref 12.0–15.0)
MCHC: 35 g/dL (ref 30.0–36.0)
Platelets: 238 10*3/uL (ref 150–400)
RDW: 13.6 % (ref 11.5–15.5)
WBC: 5.9 10*3/uL (ref 4.0–10.5)

## 2010-05-23 LAB — URINALYSIS, ROUTINE W REFLEX MICROSCOPIC
Glucose, UA: NEGATIVE mg/dL
Specific Gravity, Urine: 1.035 — ABNORMAL HIGH (ref 1.005–1.030)

## 2010-06-04 ENCOUNTER — Encounter (HOSPITAL_COMMUNITY): Payer: PRIVATE HEALTH INSURANCE | Attending: Internal Medicine

## 2010-06-04 DIAGNOSIS — Z79899 Other long term (current) drug therapy: Secondary | ICD-10-CM | POA: Insufficient documentation

## 2010-06-04 DIAGNOSIS — Z9049 Acquired absence of other specified parts of digestive tract: Secondary | ICD-10-CM | POA: Insufficient documentation

## 2010-06-04 DIAGNOSIS — K5000 Crohn's disease of small intestine without complications: Secondary | ICD-10-CM | POA: Insufficient documentation

## 2010-06-12 LAB — BASIC METABOLIC PANEL
CO2: 27 mEq/L (ref 19–32)
Calcium: 8.7 mg/dL (ref 8.4–10.5)
Calcium: 8.7 mg/dL (ref 8.4–10.5)
Creatinine, Ser: 0.6 mg/dL (ref 0.4–1.2)
GFR calc Af Amer: >60 mL/min (ref >60)
GFR calc Af Amer: >60 mL/min (ref >60)
GFR calc non Af Amer: >60 mL/min (ref >60)
GFR calc non Af Amer: >60 mL/min (ref >60)
Glucose, Bld: 151 mg/dL — ABNORMAL HIGH (ref 70–99)
Potassium: 4.1 mEq/L (ref 3.5–5.1)
Sodium: 138 mEq/L (ref 135–145)

## 2010-06-12 LAB — COMPREHENSIVE METABOLIC PANEL
Albumin: 3.5 g/dL (ref 3.5–5.2)
BUN: 8 mg/dL (ref 6–23)
Chloride: 103 mEq/L (ref 96–112)
Creatinine, Ser: 0.93 mg/dL (ref 0.4–1.2)
Total Bilirubin: 0.3 mg/dL (ref 0.3–1.2)
Total Protein: 6.3 g/dL (ref 6.0–8.3)

## 2010-06-12 LAB — CBC
HCT: 35.8 % — ABNORMAL LOW (ref 36.0–46.0)
Hemoglobin: 12.3 g/dL (ref 12.0–15.0)
MCHC: 34.3 g/dL (ref 30.0–36.0)
MCV: 95.8 fL (ref 78.0–100.0)
Platelets: 198 10*3/uL (ref 150–400)
RBC: 3.8 MIL/uL — ABNORMAL LOW (ref 3.87–5.11)
RDW: 15.3 % (ref 11.5–15.5)
RDW: 15.3 % (ref 11.5–15.5)
WBC: 12.5 10*3/uL — ABNORMAL HIGH (ref 4.0–10.5)

## 2010-06-12 LAB — DIFFERENTIAL
Basophils Relative: 0 % (ref 0–1)
Eosinophils Absolute: 0 10*3/uL (ref 0.0–0.7)
Lymphocytes Relative: 10 % — ABNORMAL LOW (ref 12–46)
Monocytes Absolute: 0.4 10*3/uL (ref 0.1–1.0)
Neutro Abs: 9.9 10*3/uL — ABNORMAL HIGH (ref 1.7–7.7)
Neutrophils Relative %: 87 % — ABNORMAL HIGH (ref 43–77)

## 2010-06-15 LAB — COMPREHENSIVE METABOLIC PANEL
ALT: 25 U/L (ref 0–35)
BUN: 11 mg/dL (ref 6–23)
CO2: 25 mEq/L (ref 19–32)
Calcium: 8.6 mg/dL (ref 8.4–10.5)
Creatinine, Ser: 0.63 mg/dL (ref 0.4–1.2)
GFR calc non Af Amer: >60 mL/min (ref >60)
Glucose, Bld: 98 mg/dL (ref 70–99)
Sodium: 139 mEq/L (ref 135–145)

## 2010-06-15 LAB — CBC
HCT: 38 % (ref 36.0–46.0)
HCT: 40.2 % (ref 36.0–46.0)
Hemoglobin: 13.2 g/dL (ref 12.0–15.0)
MCHC: 34.8 g/dL (ref 30.0–36.0)
MCV: 90.2 fL (ref 78.0–100.0)
Platelets: 201 10*3/uL (ref 150–400)
RBC: 4.21 MIL/uL (ref 3.87–5.11)
RDW: 12.7 % (ref 11.5–15.5)

## 2010-06-15 LAB — URINE MICROSCOPIC-ADD ON

## 2010-06-15 LAB — T4, FREE: Free T4: 1.13 ng/dL (ref 0.80–1.80)

## 2010-06-15 LAB — URINALYSIS, ROUTINE W REFLEX MICROSCOPIC
Ketones, ur: NEGATIVE mg/dL
Leukocytes, UA: NEGATIVE
Nitrite: NEGATIVE
Protein, ur: NEGATIVE mg/dL
pH: 6 (ref 5.0–8.0)

## 2010-06-15 LAB — RENAL FUNCTION PANEL
BUN: 7 mg/dL (ref 6–23)
Calcium: 9.1 mg/dL (ref 8.4–10.5)
Glucose, Bld: 141 mg/dL — ABNORMAL HIGH (ref 70–99)
Phosphorus: 3.6 mg/dL (ref 2.3–4.6)

## 2010-06-15 LAB — LIPASE, BLOOD: Lipase: 61 U/L — ABNORMAL HIGH (ref 11–59)

## 2010-06-15 LAB — DIFFERENTIAL
Basophils Relative: 0 % (ref 0–1)
Eosinophils Absolute: 0 10*3/uL (ref 0.0–0.7)
Lymphs Abs: 1 10*3/uL (ref 0.7–4.0)
Neutrophils Relative %: 77 % (ref 43–77)

## 2010-06-15 LAB — BASIC METABOLIC PANEL
BUN: 9 mg/dL (ref 6–23)
Calcium: 9.1 mg/dL (ref 8.4–10.5)
Creatinine, Ser: 0.71 mg/dL (ref 0.4–1.2)
GFR calc non Af Amer: >60 mL/min (ref >60)
Glucose, Bld: 127 mg/dL — ABNORMAL HIGH (ref 70–99)

## 2010-06-15 LAB — CLOSTRIDIUM DIFFICILE EIA: C difficile Toxins A+B, EIA: NEGATIVE

## 2010-06-15 LAB — TSH: TSH: 0.044 u[IU]/mL — ABNORMAL LOW (ref 0.350–4.500)

## 2010-06-22 ENCOUNTER — Ambulatory Visit (INDEPENDENT_AMBULATORY_CARE_PROVIDER_SITE_OTHER): Payer: PRIVATE HEALTH INSURANCE | Admitting: Internal Medicine

## 2010-06-22 ENCOUNTER — Inpatient Hospital Stay (HOSPITAL_COMMUNITY)
Admission: AD | Admit: 2010-06-22 | Discharge: 2010-06-25 | DRG: 387 | Disposition: A | Discharge status: Home / Self Care | Payer: PRIVATE HEALTH INSURANCE | Source: Ambulatory Visit | Attending: Internal Medicine | Admitting: Internal Medicine

## 2010-06-22 ENCOUNTER — Encounter: Payer: Self-pay | Admitting: Internal Medicine

## 2010-06-22 ENCOUNTER — Inpatient Hospital Stay (HOSPITAL_COMMUNITY): Payer: PRIVATE HEALTH INSURANCE

## 2010-06-22 VITALS — BP 146/94 | HR 76 | Ht 68.0 in | Wt 207.4 lb

## 2010-06-22 DIAGNOSIS — K5010 Crohn's disease of large intestine without complications: Secondary | ICD-10-CM

## 2010-06-22 DIAGNOSIS — R197 Diarrhea, unspecified: Secondary | ICD-10-CM

## 2010-06-22 DIAGNOSIS — K5090 Crohn's disease, unspecified, without complications: Secondary | ICD-10-CM

## 2010-06-22 DIAGNOSIS — IMO0002 Reserved for concepts with insufficient information to code with codable children: Secondary | ICD-10-CM

## 2010-06-22 DIAGNOSIS — E876 Hypokalemia: Secondary | ICD-10-CM | POA: Diagnosis present

## 2010-06-22 DIAGNOSIS — K219 Gastro-esophageal reflux disease without esophagitis: Secondary | ICD-10-CM | POA: Diagnosis present

## 2010-06-22 DIAGNOSIS — K56609 Unspecified intestinal obstruction, unspecified as to partial versus complete obstruction: Secondary | ICD-10-CM

## 2010-06-22 DIAGNOSIS — Z9049 Acquired absence of other specified parts of digestive tract: Secondary | ICD-10-CM

## 2010-06-22 DIAGNOSIS — R112 Nausea with vomiting, unspecified: Secondary | ICD-10-CM

## 2010-06-22 DIAGNOSIS — K5000 Crohn's disease of small intestine without complications: Principal | ICD-10-CM | POA: Diagnosis present

## 2010-06-22 LAB — DIFFERENTIAL
Basophils Absolute: 0 10*3/uL (ref 0.0–0.1)
Basophils Relative: 0 % (ref 0–1)
Eosinophils Absolute: 0 10*3/uL (ref 0.0–0.7)
Eosinophils Relative: 0 % (ref 0–5)
Lymphocytes Relative: 16 % (ref 12–46)
Lymphs Abs: 1.8 10*3/uL (ref 0.7–4.0)
Monocytes Absolute: 0.9 10*3/uL (ref 0.1–1.0)
Monocytes Relative: 8 % (ref 3–12)
Neutro Abs: 7.9 10*3/uL — ABNORMAL HIGH (ref 1.7–7.7)
Neutrophils Relative %: 75 % (ref 43–77)

## 2010-06-22 LAB — COMPREHENSIVE METABOLIC PANEL
ALT: 24 U/L (ref 0–35)
AST: 26 U/L (ref 0–37)
Albumin: 4.1 g/dL (ref 3.5–5.2)
Alkaline Phosphatase: 81 U/L (ref 39–117)
Glucose, Bld: 99 mg/dL (ref 70–99)
Potassium: 3.1 mEq/L — ABNORMAL LOW (ref 3.5–5.1)
Sodium: 140 mEq/L (ref 135–145)
Total Protein: 8.3 g/dL (ref 6.0–8.3)

## 2010-06-22 LAB — URINALYSIS, DIPSTICK ONLY
Glucose, UA: NEGATIVE mg/dL
Ketones, ur: >80 mg/dL — AB
Leukocytes, UA: NEGATIVE
Nitrite: NEGATIVE
Protein, ur: 30 mg/dL — AB
Specific Gravity, Urine: 1.023 (ref 1.005–1.030)
Urobilinogen, UA: 0.2 mg/dL (ref 0.0–1.0)
pH: 6 (ref 5.0–8.0)

## 2010-06-22 LAB — CBC
HCT: 42.7 % (ref 36.0–46.0)
RDW: 13 % (ref 11.5–15.5)
WBC: 10.7 10*3/uL — ABNORMAL HIGH (ref 4.0–10.5)

## 2010-06-22 LAB — TSH: TSH: 0.694 u[IU]/mL (ref 0.350–4.500)

## 2010-06-22 NOTE — Progress Notes (Signed)
[name] Apr 02, 1960 MRN [id number]    History of Present Illness:  This is a 50 year old, white female with severe Crohn's disease of the small bowel. She is status post terminal ileal resection in August 2010 with a recurrence of the disease in the ileum. The last CT scan of the abdomen on February 15 showed thickening of the distal small bowel as well as of the colon consistent with Crohn's disease. We increased her Remicade to 10 mg/kg and she had the last infusion 2 weeks ago. She is also on Imuran 150 mg a day and a prednisone taper down to 5 mg daily. 3 days ago, she started to vomit and has not been able to keep any food or liquids down. Concomitantly, she is having diarrhea. There has been no fever. No rectal bleeding.   Past Medical History  Diagnosis Date  . Crohn's disease   . Depression   . Personal history of other diseases of digestive system   . Hiatal hernia   . Adenomatous colon polyp   . GERD (gastroesophageal reflux disease)   . Vitamin B12 deficiency   . SBO (small bowel obstruction)   . Psoriasis   . Gastritis    Past Surgical History  Procedure Date  . Partial hysterectomy   . Ileocecectomy 10/2008  . Carpal tunnel release     right  . Anal fissure repair   . Cholecystectomy   . Appendectomy     reports that she quit smoking about 26 years ago. She does not have any smokeless tobacco history on file. She reports that she drinks alcohol. She reports that she does not use illicit drugs. family history includes Breast cancer in an unspecified family member; Colon cancer in her mother; and Colon polyps in her brother, father, and mother. Allergies  Allergen Reactions  . Codeine         Review of Systems: Positive for decreased energy, nausea and vomiting. Crampy abdominal pain and diarrhea. No fever or chills. The remainder of the 10  point ROS is negative except as outlined in H&P   Physical Exam: General appearance  Well developed, in mild  distress, she appears cushingoid. Eyes- non icteric. HEENT nontraumatic, normocephalic. Mouth no lesions, tongue papillated,  Cheilosis, dry oral mucosa with thrush. Neck supple without adenopathy, thyroid not enlarged, no carotid bruits, no JVD. Lungs Clear to auscultation bilaterally. Cor rapid  S1 normal S2, regular rhythm , no murmur,  quiet precordium. Abdomen mildly protuberant with normal bowel sounds. Diffuse tenderness, mostly in the right lower quadrant. No palpable mass or rebound. Well-healed surgical scar. Rectal: 2 small thrombosed hemorrhoids causing tenderness and pain on digital exam. Stool is Hemoccult negative. Extremities no pedal edema. Skin no lesions. Neurological alert and oriented x 3. Psychological normal mood and affect.  Assessment and Plan:  Patient has acute nausea and vomiting suggestive of a small bowel obstruction, in the setting of Crohn's disease of the small bowel as demonstrated on her most recent CT scan of the abdomen in February showing edema of the neo-ileum and colon. She will be admitted for hydration and IV steroids. We will obtain stool studies and continue her medications. We need to rule out the possibility of Remicade -induced lupus syndrome.    06/22/2010 [name]

## 2010-06-22 NOTE — Patient Instructions (Addendum)
You will be admitted to [facility], [place]. Please go to [facility] after you leave the office today.  Dr Kalman Drape

## 2010-06-23 DIAGNOSIS — K56609 Unspecified intestinal obstruction, unspecified as to partial versus complete obstruction: Secondary | ICD-10-CM

## 2010-06-23 DIAGNOSIS — K5090 Crohn's disease, unspecified, without complications: Secondary | ICD-10-CM

## 2010-06-23 DIAGNOSIS — R197 Diarrhea, unspecified: Secondary | ICD-10-CM

## 2010-06-23 DIAGNOSIS — R112 Nausea with vomiting, unspecified: Secondary | ICD-10-CM

## 2010-06-24 LAB — BASIC METABOLIC PANEL
CO2: 24 mEq/L (ref 19–32)
Chloride: 109 mEq/L (ref 96–112)
Glucose, Bld: 131 mg/dL — ABNORMAL HIGH (ref 70–99)
Potassium: 3.6 mEq/L (ref 3.5–5.1)
Sodium: 140 mEq/L (ref 135–145)

## 2010-06-24 LAB — HIGH SENSITIVITY CRP: CRP, High Sensitivity: 1.7 mg/L

## 2010-06-26 LAB — STOOL CULTURE

## 2010-07-09 ENCOUNTER — Other Ambulatory Visit: Payer: Self-pay | Admitting: *Deleted

## 2010-07-09 MED ORDER — CYCLOBENZAPRINE HCL 10 MG PO TABS: 10.0000 mg | ORAL_TABLET | Freq: Every evening | ORAL | Status: DC | PRN

## 2010-07-12 ENCOUNTER — Ambulatory Visit (INDEPENDENT_AMBULATORY_CARE_PROVIDER_SITE_OTHER): Payer: PRIVATE HEALTH INSURANCE | Admitting: Internal Medicine

## 2010-07-12 ENCOUNTER — Other Ambulatory Visit (INDEPENDENT_AMBULATORY_CARE_PROVIDER_SITE_OTHER): Payer: PRIVATE HEALTH INSURANCE

## 2010-07-12 ENCOUNTER — Encounter: Payer: Self-pay | Admitting: Internal Medicine

## 2010-07-12 DIAGNOSIS — E538 Deficiency of other specified B group vitamins: Secondary | ICD-10-CM

## 2010-07-12 DIAGNOSIS — K56609 Unspecified intestinal obstruction, unspecified as to partial versus complete obstruction: Secondary | ICD-10-CM

## 2010-07-12 DIAGNOSIS — K5000 Crohn's disease of small intestine without complications: Secondary | ICD-10-CM

## 2010-07-12 DIAGNOSIS — D849 Immunodeficiency, unspecified: Secondary | ICD-10-CM

## 2010-07-12 LAB — SEDIMENTATION RATE: Sed Rate: 19 mm/hr (ref 0–22)

## 2010-07-12 LAB — RENAL FUNCTION PANEL
CO2: 32 mEq/L (ref 19–32)
Chloride: 100 mEq/L (ref 96–112)
GFR: 83.99 mL/min (ref >60.00)
Phosphorus: 4.3 mg/dL (ref 2.3–4.6)
Potassium: 4.5 mEq/L (ref 3.5–5.1)
Sodium: 140 mEq/L (ref 135–145)

## 2010-07-12 MED ORDER — CYANOCOBALAMIN 1000 MCG/ML IJ SOLN
1000.0000 ug | INTRAMUSCULAR | Status: AC
  Administered 2010-07-12 – 2010-11-10 (×5): 1000 ug via INTRAMUSCULAR

## 2010-07-12 NOTE — Progress Notes (Signed)
[name] 1960-07-23 MRN [id number]    History of Present Illness:  This is a 50 year old white female with complicated Crohn's disease of the small bowel. She is status post recent hospitalization for exacerbation of Crohn's disease of the small bowel. She was treated with bowel rest and intravenous steroids. She is also on Remicade 10 mg/kg and Imuran 150 mg daily. She continues to have abdominal pain within 20 minutes of eating. The pain lasts for about 2 hours. She is having 5 loose stools a day and an occasional low-grade temperature. She was discharged on prednisone 40 mg daily. A CT scan of the abdomen in February 2012 showed thickening of the terminal ileum and colon. She had a terminal ileum resection in August 2010. 2 attempts for small bowel capsule endoscopy resulted in an incomplete study, a retention of the capsule and distal ileum   Past Medical History  Diagnosis Date  . Crohn's disease   . Depression   . Personal history of other diseases of digestive system   . Hiatal hernia   . Adenomatous colon polyp   . GERD (gastroesophageal reflux disease)   . Vitamin B12 deficiency   . SBO (small bowel obstruction)   . Psoriasis   . Gastritis    Past Surgical History  Procedure Date  . Partial hysterectomy   . Ileocecectomy 10/2008  . Carpal tunnel release     right  . Anal fissure repair   . Cholecystectomy   . Appendectomy     reports that she quit smoking about 26 years ago. She has never used smokeless tobacco. She reports that she drinks alcohol. She reports that she does not use illicit drugs. family history includes Breast cancer in an unspecified family member; Colon cancer in her mother; and Colon polyps in her brother, father, and mother. Allergies  Allergen Reactions  . Codeine         Review of Systems: Denies dysphagia, odynophagia or gastroesophageal reflux. Denies chest pain. Positive for diarrhea and abdominal pain, positive for low-grade  temperature.  The remainder of the 10  point ROS is negative except as outlined in H&P   Physical Exam: General appearance  Well developed, in no distress, appears cushingoid. Eyes- non icteric. HEENT nontraumatic, normocephalic. Mouth no lesions, tongue papillated, no cheilosis. Neck supple without adenopathy, thyroid not enlarged, no carotid bruits, no JVD. Lungs Clear to auscultation bilaterally. Cor rapid  S1 normal S2, regular rhythm , no murmur,  quiet precordium. Abdomen protuberant. Normal active bowel sounds. Markedly tender and lung right colon and hepatic flexure. No rebound. No ascites. Left upper and lower quadrants are normal . Rectal:not done. Extremities no pedal edema. Skin no lesions. Neurological alert and oriented x 3. Psychological normal mood and affect.  Assessment and Plan:   Problem #1 Patient has continued abdominal pain in the setting of small bowel Crohn's disease with suggestion of a partial obstruction on the ost recent CT scan of the abdomen. We will obtain a small bowel follow-through to define the transition point which I think is in the distal ileum. It may represent at around the rapid recurrence of the Crohn's disease at the neoileum. .  Two  previous capsule endoscopies were unsuccessful because of retention of the capsule in the distal small bowel. She has been already on maximum medical therapy including steroids, Imuran, Remicade and mesalamine. She will reduce her prednisone to 30 mg daily. Due to side effects. We will recheck her renal profile and sedimentation  rate which was elevated to 45 in the hospital.   Problem #2 immuno- compromised. There is no clinical.  evidence of infection.Continue Remicade, and Imuran. Continuos slow prednisone taper   Problem #3: B12 deficiency. She will receive 1,000 mcg of B12 today have an office visit in 4 weeks.   07/12/2010 [name]

## 2010-07-12 NOTE — Patient Instructions (Addendum)
You have been scheduled for a small bowel follow thru at [facility] on Friday 07/16/10 @ 9:00 am. Please arrive at 8:45 am for registration. You should make certain not to have anything to eat or drink 6 hours prior to your test. We have given you a b12 injection today. Your next injection is scheduled for 08/09/10 @ 9:30 am. Your physician has requested that you go to the basement for the following lab work before leaving today: Renal Profle, Sedimentation Rate Dr Kalman Drape

## 2010-07-13 ENCOUNTER — Telehealth: Payer: Self-pay | Admitting: *Deleted

## 2010-07-13 NOTE — Telephone Encounter (Signed)
Patient notified of lab results as per Dr. Olevia Perches.

## 2010-07-13 NOTE — Telephone Encounter (Signed)
Message copied by Leone Payor on Tue Jul 13, 2010  9:33 AM ------      Message from: [name]      Created: Mon Jul 12, 2010 10:44 PM       Please call pt with improved blood test results.

## 2010-07-14 ENCOUNTER — Other Ambulatory Visit: Payer: Self-pay | Admitting: *Deleted

## 2010-07-14 MED ORDER — HYDROCODONE-ACETAMINOPHEN 5-500 MG PO CAPS: ORAL_CAPSULE | ORAL | Status: DC

## 2010-07-14 MED ORDER — DIPHENOXYLATE-ATROPINE 2.5-0.025 MG PO TABS: 1.0000 | ORAL_TABLET | Freq: Three times a day (TID) | ORAL | Status: DC

## 2010-07-14 MED ORDER — AZATHIOPRINE 50 MG PO TABS: ORAL_TABLET | ORAL | Status: DC

## 2010-07-14 NOTE — Telephone Encounter (Signed)
I have spoken to Dr Olevia Perches. She is okay with patient having refill of hydrocodone. She will also receive refills for azothiaprine and lomotil.

## 2010-07-16 ENCOUNTER — Ambulatory Visit (HOSPITAL_COMMUNITY)
Admission: RE | Admit: 2010-07-16 | Discharge: 2010-07-16 | Disposition: A | Discharge status: Home / Self Care | Payer: PRIVATE HEALTH INSURANCE | Source: Ambulatory Visit | Attending: Internal Medicine | Admitting: Internal Medicine

## 2010-07-16 DIAGNOSIS — K5000 Crohn's disease of small intestine without complications: Secondary | ICD-10-CM

## 2010-07-16 DIAGNOSIS — K5090 Crohn's disease, unspecified, without complications: Secondary | ICD-10-CM | POA: Insufficient documentation

## 2010-07-16 DIAGNOSIS — K5989 Other specified functional intestinal disorders: Secondary | ICD-10-CM | POA: Insufficient documentation

## 2010-07-16 DIAGNOSIS — K56609 Unspecified intestinal obstruction, unspecified as to partial versus complete obstruction: Secondary | ICD-10-CM

## 2010-07-19 ENCOUNTER — Encounter: Payer: Self-pay | Admitting: Internal Medicine

## 2010-07-19 ENCOUNTER — Telehealth: Payer: Self-pay

## 2010-07-19 DIAGNOSIS — K56609 Unspecified intestinal obstruction, unspecified as to partial versus complete obstruction: Secondary | ICD-10-CM

## 2010-07-19 NOTE — Telephone Encounter (Signed)
Message copied by Barb Merino on Mon Jul 19, 2010  5:22 PM ------      Message from: [place], [place]      Created: Mon Jul 19, 2010  9:41 AM       I have discussed the results of the SBFT with the pt at length, she is having recurrent SBO at the level of terminal neoileum, 1 cm long segment. There is a proximal dilation. Please set up an appointment for her to see DR Delorise Royals for consideration of a small bowl resection. We need to send all her records. thanx

## 2010-07-19 NOTE — Telephone Encounter (Signed)
Patient is scheduled for an appt with Dr Donne Hazel for 08/05/10 3:25.  Records sent.

## 2010-07-20 NOTE — Discharge Summary (Signed)
[name], [name]            ACCOUNT NO.:  [id number]   MEDICAL RECORD NO.:  [id number]          PATIENT TYPE:  INP   LOCATION:  3031                         FACILITY:  [facility]   PHYSICIAN:  Durwin Nora, MDDATE OF BIRTH:  Apr 23, 1960   DATE OF ADMISSION:  10/15/2007  DATE OF DISCHARGE:  10/20/2007                               DISCHARGE SUMMARY   DISCHARGE DIAGNOSES:  1. Questionable inflammatory bowel disease.  2. Hypertension.  3. Anemia of chronic disease.  4. History of colon polyps.  5. Gastroesophageal reflux disease.  6. Psoriasis.  7. Hypokalemia, repleted.  8. Chronic abdominal pain.   CONSULTS:  [name], MD, [facility], gastroenterologist.   PROCEDURE:  Colonoscopy was performed by Dr. [name] on July 02, 2007.  Capsule endoscopy was performed on October 19, 2007, results are  pending, this was by Dr. Carlean Purl.   HOSPITAL COURSE:  This 50 year old female presented to the emergency  room with nausea, vomiting, diarrhea, and abdominal discomfort.  The  patient has had a colonoscopy performed by a gastroenterologist in April  2009 which was not confirmatory for Crohn disease and she had been taken  off Asacol, which she was previously on.  On this admission, she was  started on IV Cipro and Flagyl and p.r.n. Phenergan for the nausea.  Her  blood pressure was controlled with increasing doses of metoprolol.  The  patient's nausea, vomiting, and diarrhea did improve clinically.  The  patient is very anxious to be discharged home and she has moderate-to-  severe hypertension today.  She is insisting on being discharged home  today.  The patient to be followed up by the primary care physician and  gastroenterologist in about a week.  She has been clear for discharge by  the gastroenterologist.   DISCHARGE CONDITION:  Stable.   DIET:  To be low residue, heart healthy.   ACTIVITY:  Increase slowly as tolerated.   FOLLOWUP:  Follow up with her primary  care physician in 1 week.  Follow  up with gastroenterologist, Dr. Olevia Perches, in a week at that time, she will  follow up the capsule endoscopy report.   MEDICATIONS ON DISCHARGE:  1. Metoprolol 50 mg twice daily.  2. Cipro 500 mg twice daily for 4 more days.  3. Flagyl 500 mg twice daily for 4 more days.  4. Phenergan 12.5 mg p.o. every 8 hours as needed for nausea.   RADIOLOGY:  Abdominal x-ray on October 18, 2007, shows several  abnormalities of small intestine with slight thickening of the bowel  wall.  CT abdomen and pelvis on October 18, 2007, shows fat in the wall  of the cecum, which has been described as a fat yellow sign.  It could  be a number of events, including inflammatory bowel disease, including  Crohn disease and ulcerative colitis as it is treatment of small bowel  loops in the left upper quadrant of the abdomen suggestive of enteritis.   PHYSICAL EXAMINATION:  VITAL SIGNS:  Temperature is 98, pulse 92,  respiratory rate is 20, and blood pressure 160/90.  HEENT:  Pupils are  equal and reacting to light and accommodation.  Mildly pale and not jaundiced.  Extraocular muscles intact.  Mucous  membranes are moist.  NECK:  Supple.  No adenopathy or elevated JVD.  CHEST:  Clinically clear.  HEART:  Heart sounds 1 and 2, regular.  ABDOMEN:  Benign.  NEUROLOGIC:  He is alert and oriented to time, place, and person.  Cranial nerves, sensory, and motor system normal.  EXTREMITIES:  Peripheral pulses present.  No pedal edema.   LABORATORY DATA:  Stool culture negative.  Urine culture with 70,000  colony of multiple bacterial morphotypes, not clinically significant.  Blood culture negative so far.  C. difficile toxin negative.  Repeat CBC  shows a WBC of 5.8, hematocrit 34.8, and platelet count 171.  Chemistries:  Sodium is 139, potassium 2.5, chloride 109, bicarbonate  25, glucose 96, BUN 6, and creatinine 0.6.      Durwin Nora, MD  Electronically Signed      MIO/MEDQ  D:  10/20/2007  T:  10/21/2007  Job:  [id number]

## 2010-07-20 NOTE — H&P (Signed)
[name], [name]            ACCOUNT NO.:  [id number]   MEDICAL RECORD NO.:  [id number]          PATIENT TYPE:  INP   LOCATION:  5158                         FACILITY:  [facility]   PHYSICIAN:  Lowella Bandy. Olevia Perches, MD     DATE OF BIRTH:  03-Nov-1960   DATE OF ADMISSION:  07/26/2008  DATE OF DISCHARGE:                              HISTORY & PHYSICAL   PROBLEM:  Intractable nausea and vomiting concerning for partial small-  bowel obstruction.   HISTORY:  [name] is a 50 year old white female known to Dr. [name]  primary patient of Dr. Gerrit Heck who was diagnosed with Crohn  disease at the time of a capsule endoscopy in August 2009.  This showed  erythema and edema in the jejunum as well as multiple scattered ulcers  and edema in the more distal small bowel.  She also has had positive IBD  markers.  At this time, she presents to the emergency room with 5-day  history of nausea, vomiting and diarrhea with crampy abdominal pain.  She has been on Humira 40 q. 2 weeks since April 2010 and her prednisone  was also tapered at that time.  She was also started on Entocort 9 mg  per day, this was tapered to 3 mg over the last week and then  discontinued.  She is also on Imuran 100 mg daily.  She says her  diarrhea started once the Entocort got tapered off and she is now sick  having 6-7 watery stools per day.  She did not respond to Zofran and  Phenergan in the ER.  She is seen and evaluated by Dr. Olevia Perches who is  covering on-call and is admitted at this time with concerns for a  partial bowel obstruction.  Abdominal films done in the emergency room  showed minimal small bowel ileus.   CURRENT MEDICATIONS:  1. B12 supplement daily.  2. Humira 40 q. 2 weeks last dose Jul 23, 2008.  3. Omeprazole 20 mg daily.  4. Azathioprine 150 mg daily.  5. Entocort 3 mg daily.   ALLERGIES:  No known drug allergies.   PAST MEDICAL HISTORY:  As outlined above.  She also has history of   hypertension.   SOCIAL HISTORY:  The patient is a long-term smoker.  No EtOH.  She has  quit smoking over the past year.  Is employed at [employer].   FAMILY HISTORY:  Positive for colon cancer in her mother and one aunt  with Crohn disease.   REVIEW OF SYSTEMS:  CARDIOVASCULAR:  Denies any chest pain or anginal  symptoms.  PULMONARY:  Negative for cough, shortness of breath or sputum  production.  GENITOURINARY:  Negative.  GI:  As above.  MUSCULOSKELETAL:  No current complaints.  All other review of systems negative except as  in HPI.   PHYSICAL EXAMINATION:  GENERAL:  Well-developed white female in no acute  distress.  She is alert and oriented x3, nauseated  VITAL SIGNS:  Temperature is 97, blood pressure 123/108, pulse is in the  90s, respirations 16.  HEENT: Nontraumatic, normocephalic.  EOMI.  PERRLA.  Sclerae anicteric.  NECK:  Supple without nodes.  She has no mucosal ulcers in her mouth.  CARDIOVASCULAR:  Tachy regular rhythm with S1-S2.  PULMONARY:  Clear to A and P.  ABDOMEN:  Soft.  Bowel sounds are active.  She is diffusely tender.  There is no palpable mass or hepatosplenomegaly.  No guarding or  rebound.  RECTAL:  Yellow stool which is heme negative.  There is no evidence of  perianal disease.  EXTREMITIES:  She does have some areas of psoriasis scattered in her  arms and her legs.   LABORATORY STUDIES ON ADMISSION:  WBC of 6.6, hemoglobin 13.2,  hematocrit of 38, MCV of 90, platelets 198.  Electrolytes within normal  limits.  LFTs normal.  Lipase of 61.  UA shows 0-2 RBCs, few bacteria.  Culture is ordered.   IMPRESSION:  1. A 50 year old female with known Crohn disease of the small bowel      presenting with intractable nausea and vomiting, rule out partial      small-bowel obstruction.  2. History of depression.  3. Psoriasis  4. History of B12 deficiency.   PLAN:  The patient is admitted to the service of Dr. [name] for IV  fluid hydration and  antiemetics.  She will be covered with IV Solu-  Medrol 20 q. 12.  Continue on her Imuran.  Plan to continue her Humira  with mixed dose due August 06, 2008.  May need repeat capsule endoscopy  depending on her course.      [name], PA-C      Dora M. Olevia Perches, MD  Electronically Signed    AE/MEDQ  D:  07/27/2008  T:  07/28/2008  Job:  [id number]

## 2010-07-20 NOTE — Consult Note (Signed)
[name], [name]            ACCOUNT NO.:  [id number]   MEDICAL RECORD NO.:  [id number]          PATIENT TYPE:  INP   LOCATION:  3031                         FACILITY:  [facility]   PHYSICIAN:  [name], MD,FACGDATE OF BIRTH:  12/16/1960   DATE OF CONSULTATION:  10/17/2007  DATE OF DISCHARGE:                                 CONSULTATION   The patient was seen in consultation originally on October 16, 2007,  because of nausea, vomiting, and diarrhea.  This lady is a 50 year old  woman with a long history of diarrhea and predominant irritable bowel  syndrome.  In the spring of this year, she developed problems with  nausea, vomiting and diarrhea, abdominal pain as well as bright red  blood per rectum.   She had a workup in  (records available for review today) that  showed a CT scan on May 08, 2007, with wall thickening in the ascending  colon, into the proximal transverse colon with mild pericolic stranding,  and wall thickening in multiple loops of distal ileum (considerable wall  thickening).  There was prominent transmural inflammation of the cecum  and a string sign in the distal ileum.  She was seen by Dr. Lyda Jester.  Note that she also had a small but abnormal amount of pelvic ascites.  Dr. Lyda Jester thought that Crohn disease was possible but that these  new findings were certainly not conclusive.  He recommended a small  bowel series and this was performed in early April which demonstrated  changes and string sign in terminal ileum without any dilated loops of  bowel, there was a second loop distal ileum abnormal.  The cecum in the  ascending colon was thought to have a similar finding as well.  Because  of these changes, a colonoscopy was performed on June 28, 2007, which  showed some diminutive colon polyps, but a normal terminal ileum and a  normal colon mucosa otherwise.  Pathology was obtained, I do not have  those reports though her Asacol was  discontinued after the biopsies  returned.   Subsequently, she has presented again with problems and CT scanning here  has demonstrated some thickening of the loops of bowel in the left upper  quadrant of the abdomen without any evidence for obstruction as well as  prominent intramural fat in the wall of the cecum and ileocecal valve  extending slightly into the ascending colon.  This again is concerning  for possible Crohn disease.  She has been treated conservatively and has  improved.   PAST MEDICAL HISTORY:  Includes hypertension and psoriasis.  She is on  Enbrel for her psoriasis.   For further details of the history and physical exam, please see the  hospital records.  Note that she does have a history of the adenomatous  polyps of the colon followed by Dr. Olevia Perches previously with a normal  colonoscopy, July 01, 2004.  Her hemoglobin is 12.4 and white count 14  to 8.2.   ASSESSMENT:  It is not clear what this lady has.  I think she could have  inflammatory bowel disease as noted above  in the x-ray studies, but the  colonoscopy did not confirm this and one would have expected it to do so  I think.  I have wondered about the possibility of angioedema of the gut  related to Enbrel, angioedema is a possibility.  That would be a rare  and unusual diagnosis.   PLAN:  Capsule endoscopy of the small bowel is indicated to try to sort  this out further.  We will try to obtain that for tomorrow while she is  hospitalized though I am not sure we can do so.  Other considerations  would be a CT enterography, but I am not convinced that would offer any  new information at this time.      [name], MD,FACG  Electronically Signed     CEG/MEDQ  D:  10/17/2007  T:  10/18/2007  Job:  [id number]   cc:   Lowella Bandy. Olevia Perches, MD  Winifred Olive Rocky Link, M.D.

## 2010-07-20 NOTE — H&P (Signed)
[name], [name]            ACCOUNT NO.:  [id number]   MEDICAL RECORD NO.:  [id number]          PATIENT TYPE:  INP   LOCATION:  3031                         FACILITY:  [facility]   PHYSICIAN:  Estelle June, MD       DATE OF BIRTH:  May 29, 1960   DATE OF ADMISSION:  10/15/2007  DATE OF DISCHARGE:                              HISTORY & PHYSICAL   CHIEF COMPLAINT:  The patient is coming in for nausea, vomiting, and  diarrhea, which has started since 1 o'clock this morning.   HISTORY OF PRESENT ILLNESS:  This is a 50 year old white female coming  in with sudden onset of nausea, vomiting, and diarrhea, which started  earlier at around 1 o'clock in the morning of this day.  Per the  patient, it awoke her up from the sleep, has vomited about 10-12 times,  and had about 20 bowel movements with constant nausea.  Has been unable  to keep anything down p.o. since the onset of her symptoms.  The patient  denies any blood in her stool or the vomitus.  The patient denies any  fevers, chills, shortness of breath, recent travel, or sick contacts.  Please note that the patient was diagnosed with Crohn disease in March  when she had her symptoms for the first time based on oral GI series;  however, when she had a colonoscopy by a gastroenterologist in April,  she was told she does not have Crohn disease and was taken off Asacol.   PAST MEDICAL HISTORY:  High blood pressure.   SOCIAL HISTORY:  The patient is a long-time nonsmoker.  She used to  smoke many years in the past.  No alcohol and no drugs.  Works at [employer].   Family history is noncontributory.   ALLERGIES:  No known allergies.   REVIEW OF SYSTEMS:  Please refer to the HPI.   Pertinent labs include the following:  Sodium 139, potassium 3.1,  chloride 106, bicarb 22, BUN 11, creatinine 0.6, glucose 122, calcium  9.3, albumin 4, AST 19, ALT 22, white count of 14.1, hemoglobin 14.5,  hematocrit 42.8, and platelets 230.   The  patient's vital signs show temperature of 97.3, respirations 20,  pulse of 93, blood pressure of 164/99, and O2 sat is 98% on room air.  On physical exam, the patient is in no acute distress and able to talk  in full sentences.  Dry mucous membranes.  Neck is supple.  No JVD  appreciated.  Lungs are clear to auscultation.  Cardiovascular, regular  rate and rhythm and tachycardiac.  Abdominal exam, bowel sounds are  decreased.  She does have mild right upper quadrant tenderness.  No  rebound or guarding noted.  Lower extremities, no edema noted.  On the  skin, the patient has psoriasis.   ASSESSMENT AND PLAN:  A 50 year old female coming with an onset of  nausea, vomiting, abdominal pain, and increased white count on  chemistry.  Plan is to admit the patient.  The patient has received 1 L  of normal saline.  We will also give more IV fluids along  with 40 mEq of  potassium IV and the 40 mEq of potassium p.o.  We will hold  antibiotics.  We will do blood cultures and urine cultures.  We will get  a CT abdomen with contrast to make sure the patient does not have any  obstruction or abscess.  We will hold her diuretics.  We will put her on  a low-dose beta-blocker for blood pressure control.  The patient may  need a GI consult in the morning.      Estelle June, MD  Electronically Signed     RP/MEDQ  D:  10/16/2007  T:  10/16/2007  Job:  [id number]

## 2010-07-20 NOTE — Op Note (Signed)
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   PROCEDURE:  Laparoscopic-assisted ileocecectomy.   SURGEON:  Mila Homer. Donne Hazel, MD.   ASSISTANT:  Greer Pickerel, MD.   ANESTHESIA:  General.   FINDINGS:  Terminal ileal and cecal Crohn disease.   SPECIMENS:  The terminal ileum and cecum to pathology.   ESTIMATED BLOOD LOSS:  Minimal.   COMPLICATIONS:  None.   SUPERVISING ANESTHESIOLOGIST:  Rudean Curt, MD.   DISPOSITION:  To recovery room in stable condition.   INDICATIONS:  [name] is a 50 year old white female with a  history of Crohn's disease passing a capsule endoscopy and a small bowel  followthrough.  She has a history of a normal colonoscopy in April, 2009  by [facility] in [place].  She has been hospitalized in the  recent past several times for symptoms of a partial small-bowel  obstruction.  She also has been on high-dose prednisone for quite a  while and has been  unable to tolerate tapering this amount.  She is  currently on maximal medical therapy and still practically with every  meal she has pain, nausea and vomiting.  She is really only tolerating  clear liquids at this point.  She had a recent small bowel followthrough  in early the July that shows persistent mild dilatation of the proximal  small bowel with marked irregularity and luminal narrowing of the  terminal ileum.  She does also mention is this area of pain on her focal  spot  compression views.  The cecum also appears irregular on spot  compression views.  This was negative for obstruction as contrast did  pass through at that point.  She came to see me for evaluation for this  chronic in nature with terminal ileal disease.  We discussed a  laparoscopic exploration and likely lap-assisted ileocecectomy.  We  discussed at length the risks of this operation with her husband  present.   PROCEDURE:  After informed consent was obtained the patient was then  taken to the operating room.  She was administered 1 gram of intravenous  Invanz and 5000 units of subcutaneous heparin was administered to her  prior to the operation.  Sequential compression devices were also placed  on her lower extremities prior to induction.  She then underwent general  endotracheal anesthesia without complication.  A orogastric tube and  Foley catheter were then placed.  Her abdomen was prepped with  ChloraPrep and 3 minutes were allowed to pass.  She was then draped in  the standard sterile surgical fashion.  A surgical time-out was then  performed.   I had her small bowel  followthrough and her prior CAT scan available to  me in the operating room and reviewed these.  I made a 10 mm incision  just below her umbilicus and carried this down to the level of her  fascia.  Her tissue was very friable given her chronic steroid use.  I  then incised the fascia and placed a zero Vicryl pursestring suture  through the fascia.  The Hasson trocar was then introduced and the  abdomen was insufflated to 15 mmHg pressure.  After infiltration with  local anesthetic under direct vision two further 5 mm ports were placed  in the suprapubic region, as well as in the epigastrium. I then  approached the cecum.  Her entire small bowel was run with really not in  any evidence of any active Crohn disease.  Her terminal ileum did have  some creeping fat present on it and did appear to what appeared to be   some chronic terminal ileitis in this region as well, as well as her  cecum appeared abnormal.  There was some creeping fat on her terminal  ileum as well.  I then used the Harmonic scalpel and mobilized the right  colon to the midline along the white line of Toldt.  Following this,  when it was adequately mobilized I then made  a 6 cm incision around her  umbilicus and inserted a hand port.  I used this as a wound protector  and a eviscerated the remainder of her small bowel and ran this.  There  was really no abnormality, except for in the terminal ileum and this  appeared to be not really active Crohn's disease, but some more chronic  disease that was causing her problems and this continued onto her cecum.  I chose a point and removed about 18 cm of her ileum that appeared to be  abnormal.  There did not appear to be any way to really to  stricturoplasty on this region.  There was no defined stricture in this  area, just a lot of irregularity that had been treated maximally with  medical therapy in the past.  Due to this, I then stapled across the  proximal border.  I then encircled her cecum and stapled across this  with a GIA 75 with a gold load as this  certainly was thickened.  Following this, I then used Kelly clamps and was a combination of 2-0  silk ties and 2-0 silk ligatures to divide the mesentery.  This was  extraordinarily friable doing this and I oversewed an additional bleeder  with a 2-0 silk suture as well.  Following this, I then placed the small  bowel next to the large intestine and the 3-0 silk was used to  approximate the ends, as well as at the apex of where the staple line  would be I then made an enterotomy with a Bovie.  The stapler was then  inserted and fired.  Hemostasis was observed at the staple line.  A TA  stapler was then used to close the common enterotomy.  I did oversew the  staple line with 3-0 silk sutures at that time.  This was patent and all   appeared healthy.  I placed an additional 3-0 silk suture at the crotch  as well.  This all appeared healthy and again it was patent upon  completion.  I placed the omentum overlying this region.  The wound  protector was then removed.  I closed the fascial  incision with  interrupted #1 PDS sutures.  I then reinserted the laparoscope after  performing irrigation and viewed this area.  The closure appeared to be  good.  I closed the mesenteric defect with interrupted 2-0 Vicryl  sutures.  All of the fluid was then evacuated.  The trocars were then  removed and the wounds were all stapled.  She tolerated this well, was  extubated in the operating room had her Foley catheter removed and she  was transferred to the recovery room in stable condition.      Dickey Gave, MD  Electronically Signed     MCW/MEDQ  D:  10/09/2008  T:  10/09/2008  Job:  [id number]   cc:   Lowella Bandy. Olevia Perches, [place]  [place] [address]

## 2010-07-20 NOTE — Discharge Summary (Signed)
NAMEMARIZOL, [name]            ACCOUNT NO.:  [id number]   MEDICAL RECORD NO.:  [id number]          PATIENT TYPE:  INP   LOCATION:  1950                         FACILITY:  [facility]   PHYSICIAN:  [name], MD,FACGDATE OF BIRTH:  1960/06/11   DATE OF ADMISSION:  07/26/2008  DATE OF DISCHARGE:  07/31/2008                               DISCHARGE SUMMARY   ADMITTING DIAGNOSES:  1. Intractable nausea, vomiting, question partial small-bowel      obstruction.  2. History of Crohn disease of the small bowel involving the jejunum.  3. Depression.  4. Psoriasis.  5. B12 deficiency.  6. Status post partial hysterectomy.  7. Status post carpal tunnel release.  8. Status post repair of an anal fissure.  9. Status post cholecystectomy.  10.History of adenomatous colon polyps.  11.History of hiatal hernia and gastroesophageal reflux disease.  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  13.History of hemorrhoids.  14.Former smoker, quit about a year ago.   DISCHARGE DIAGNOSES:  1. Question partial small-bowel obstruction versus ileus, nausea and      vomiting resolved.  2. History of Crohn disease involving the jejunum.  There is a      question at this point as to whether this is an active Crohn      disease or whether it is inactive and possibly stricturing.  3. B12 deficiency, the patient currently not anemic.  4. Chronic diarrhea refractory to her Crohn's medication regimen.  5. Slight decrease of thyroid stimulating hormone, but there free T4      and T3 are normal.  6. Hypertension.  7. Mild hyperglycemia, probably secondary to steroids initiated during      this admission.   PROCEDURES:  None.   CONSULTATIONS:  None.   BRIEF HISTORY:  [name] is a 50 year old woman who had had  abdominal pain for a few years and was finally diagnosed with Crohn  disease involving the jejunum.  Diagnosis was made by capsule endoscopy  in August 2009, and this showed erythema and edema at the jejunum  with  scattered ulcers and edema in the more distal small bowel.  She had  positive IBD markers.  The patient came to the emergency room at [facility] complaining of nausea, vomiting, diarrhea, and crampy abdominal  pain for about 5 days.  She had been on her stable doses of Humira shots  every 2 weeks since April 2010.  A prednisone taper had been initiated  in April 2010, and she had been started on Entocort 9 mg daily.  About a  week prior to her presenting to the emergency room, the Entocort had  ultimately been tapered to 3 mg a day and about a week ago, it was  discontinued.  She remains on Imuran 100 mg daily.  The patient says  that as the Entocort tapered off, the diarrhea got worse and she was  having up to 6-7 watery stools daily.  In the emergency room, abdominal  films showed a minimal small-bowel ileus, so there was some concern she  may have some element of a small-bowel obstruction.  Note that the  patient was admitted around March 2010 to [facility] under the  care of Dr. Lyda Jester and had what sounds like a small bowel follow-  through.  At this time, we do not have those records, however.   X-RAYS:  Acute abdominal series of Jul 26, 2008, showed minimal small-  bowel ileus.   LABORATORY DATA:  Initial CBC showed a hemoglobin of 13.3, hematocrit of  37, white blood cell count of 8.2, platelet count of 189.  Final CBC  with hemoglobin 13.8, hematocrit 40.2, white blood cell count 6.8,  platelets 201.  TSH 0.44, which is slightly low, T3 level 79.1, free T4  level 1.13.  C.  diff toxin was tested twice and both times negative.  The sed rate was 27.  Sodium 139, potassium 3.8, chloride 106, CO2 25.  Glucose 98.  BUN 11, creatinine 0.6.  Lipase was 61.  Urinalysis showed  negative nitrites, negative leukocyte esterase.  Urine microscopic  showed a few squamous epithelial cells, 0-2 red blood cells per high-  powered field, and very few bacteria.   HOSPITAL  COURSE:  The patient's nausea and vomiting resolved within  about 24-36 hours.  However, she continued to have abdominal pain  without nausea, vomiting.  Frequency of her bowel movements did  decrease, but they continued to be watery.  She was able to tolerate  certain foods but others seem to exacerbate her diarrhea.  Tolerated  foods included mashed potatoes, chicken broth, and pudding.  However,  beef broth, tea, [place] ices did exacerbate her diarrhea.  However, by  the day of her discharge, she had tolerated a ham biscuit get and was  feeling quite well with very little in the way of lingering abdominal  pain.  She was stable at the time of discharge.   As to further studies in order to delineate exactly the degree of  activity of her Crohn disease, considered studies to be pursued as an  outpatient include CT enterography versus capsule endoscopy versus MR  enterography.  We did attempt a CT enterography on Jul 29, 2008;  however, she threw up the contrast; therefore, further testing was  postponed to be pursued as an outpatient.   MEDICATIONS AT DISCHARGE:  1. Prednisone 40 mg daily for 7 days, then 30 mg daily for 7 days,      then 20 mg daily thereafter.  2. Entocort is discontinued as of this admission.  3. Humira 40 mg every 2 weeks.  Her last dose was on Jul 23, 2008.  4. Omeprazole 20 mg daily.  5. Azathioprine 150 mg daily.  Note that this medication had been at      100 mg daily, but it was increased to 150 mg daily about a week      prior to admission.   Return office visit is with Dr. [name] on August 27, 2008, at 11:00  a.m.  The patient has been asked to get copies of any tests, as well as  discharge summary and history and physical, which was generated during  her stay at [facility], so that we will have those for the GI  permanent record at the office.   CONDITION AT DISCHARGE:  Stable.   DIET AT DISCHARGE:  Low fiber.      Azucena Freed,  PA-C      [name], MD,FACG  Electronically Signed    SG/MEDQ  D:  07/31/2008  T:  08/01/2008  Job:  [id number]  cc:   Lowella Bandy. Olevia Perches, MD  Winifred Olive Rocky Link, M.D.

## 2010-07-20 NOTE — Op Note (Signed)
[name], [name]            ACCOUNT NO.:  [id number]   MEDICAL RECORD NO.:  [id number]          PATIENT TYPE:  INP   LOCATION:  3031                         FACILITY:  [facility]   PHYSICIAN:  [name], MD,FACGDATE OF BIRTH:  1960-10-06   DATE OF PROCEDURE:  10/19/2007  DATE OF DISCHARGE:  10/20/2007                               OPERATIVE REPORT   PROCEDURE:  Small bowel capsule endoscopy.   INDICATIONS:  Abdominal pain, diarrhea, rectal bleeding/blood in stool,  and possible Crohn disease.  This has been suggested by CT scanning, at  different times, she has had different areas of small bowel inflamed on  CT scan.  Colonoscopy was apparently normal with normal terminal ileum  in [place].   PROCEDURE DATA:  Height 68 inches, weight 209 pounds, waist 38 inches,  stocky build.  Gastric passage x0 hours 25 minutes.   FINDINGS:  1. It does not appear that the capsule passed into the cecum and      colon.  However, there are some areas of the bowel seen after 2      hours and 30 minutes, it looked like colonic mucosa, though no      clear transition from the ileocecal valve into the colon was ever      identified.  2. Changes of erythema and edema in the jejunum as well as multiple      scattered ulcers and edema in the small bowel.   SUMMARY AND RECOMMENDATIONS:  These findings are consistent with Crohn  disease of small intestine.  Some of themucosal views after 2 hours and  30 minutes look like colonic mucosa, but again no ileocecal valve was  clearly identified.  I suspect that might be ileum with loss of villi.  She is coming to see Dr. Olevia Perches in the near future and further workup  and treatment plans will be decided then.      [name], MD,FACG  Electronically Signed     CEG/MEDQ  D:  10/30/2007  T:  10/31/2007  Job:  [id number]

## 2010-07-21 ENCOUNTER — Ambulatory Visit: Payer: PRIVATE HEALTH INSURANCE | Admitting: Internal Medicine

## 2010-07-27 ENCOUNTER — Other Ambulatory Visit: Payer: Self-pay | Admitting: *Deleted

## 2010-07-27 MED ORDER — FLUCONAZOLE 100 MG PO TABS: ORAL_TABLET | ORAL | Status: DC

## 2010-07-27 NOTE — Telephone Encounter (Signed)
Message copied by Bernita Buffy on Tue Jul 27, 2010 10:41 AM ------      Message from: [name]      Created: Tue Jul 27, 2010  9:25 AM      Regarding: refill Diflucan       Yes, please, refill Diflucan. DB      ----- Message -----         From: Bernita Buffy, CMA         Sent: 07/27/2010   8:37 AM           To: Lafayette Dragon, MD            Patient is requesting an rx for fluconazole 186m you last gave it to her in 02/2010. She has crohns with chronic Candida esophagitis. Would you like to refill??

## 2010-07-30 ENCOUNTER — Encounter (HOSPITAL_COMMUNITY): Payer: PRIVATE HEALTH INSURANCE | Attending: Internal Medicine

## 2010-07-30 ENCOUNTER — Telehealth: Payer: Self-pay | Admitting: *Deleted

## 2010-07-30 DIAGNOSIS — Z9049 Acquired absence of other specified parts of digestive tract: Secondary | ICD-10-CM | POA: Insufficient documentation

## 2010-07-30 DIAGNOSIS — K5000 Crohn's disease of small intestine without complications: Secondary | ICD-10-CM | POA: Insufficient documentation

## 2010-07-30 DIAGNOSIS — Z79899 Other long term (current) drug therapy: Secondary | ICD-10-CM | POA: Insufficient documentation

## 2010-07-30 MED ORDER — INFLIXIMAB 100 MG IV SOLR: INTRAVENOUS | Status: DC

## 2010-07-30 NOTE — Discharge Summary (Signed)
[name], [name]            ACCOUNT NO.:  [id number]  MEDICAL RECORD NO.:  [id number]           PATIENT TYPE:  I  LOCATION:  5409                         FACILITY:  [facility]  PHYSICIAN:  [name], MD,FACGDATE OF BIRTH:  [birth year]  DATE OF ADMISSION:  06/22/2010 DATE OF DISCHARGE:  06/25/2010                              DISCHARGE SUMMARY   ADMITTING DIAGNOSES: 58. 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 2. Diarrhea, chronic. 3. Status post resection of the terminal ileum, August 2010, with     recurrent disease in the distal ileum. 4. Immunocompromised state. 5. History of depression. 6. Gastroesophageal reflux disease. 7. History of adenomatous polyps. 8. Asthma.  DISCHARGE DIAGNOSES: 1. Acute exacerbation of Crohns enteritis, symptomatically improved     with IV steroids 2. Hypokalemia, resolved. 3. Diarrhea, chronic.  OTHER DIAGNOSES:  As listed above.  COMPLICATIONS:  None.  PROCEDURES:  None.  BRIEF HISTORY:  [name] is a very nice 50 year old white female followed by Dr. [name] with known long-term Crohns disease of the small bowel.  She is status post resection of the terminal ileum in August of 2010.  She has had problems with chronic diarrhea generally having 8 to 10 bowel movements per day.  She has been on steroids over the past several months with gradual tapering down to 5 mg per day.  She is also on chronic Imuran and of Remicade.  Because of persistence of symptoms, her Remicade dosage had been increased from 5 mg/kg to 10 mg/kg with her last infusion which was approximately 2 weeks ago.  She says over the past couple of weeks, she has noticed some increase in abdominal pain and cramping and now over the past 3 days has developed nausea and vomiting in addition to worsening  right-sided abdominal pain.  Her diarrhea persists but is no worse than her usual state.  She was seen and evaluated in the office and admitted with concerns for underlying low grade small-bowel obstruction.  LABORATORY STUDIES:  On June 22, 2010, WBC of 10.7, hemoglobin 14.5, hematocrit of 42.7, potassium of 3.1.  Electrolytes otherwise within normal limits.  Liver function studies normal.  Albumin was 4.1. Followup on June 24, 2010, sed rate was 45, potassium 3.6, CRP 1.7.  UA on admission was moderate for ketones and bilirubin, otherwise negative. ANA negative.  C diff by PCR negative.  Stool cultures also negative.  X-RAY STUDIES:  Plain abdominal films on June 22, 2010, showed few air- fluid levels in the right abdomen and upper pelvis, nonspecific bowel gas pattern.  No evidence of free air.  HOSPITAL COURSE:  The patient had a benign hospital course.  She was initially placed on Solu-Medrol 60 mg IV per day and a liquid diet.  For the first 48 hours, she continued to have nausea and some intermittent dry heaves and vomiting but overall quickly improved.  Her pain decreased.  She continued to have diarrhea which is her usual condition without  any change.  She remained afebrile.  She was hypokalemic on admission, this was corrected.  We were able to gradually advance her diet which she tolerated without difficulty and by June 25, 2010, she was feeling better without really any residual nausea or vomiting.  She had tolerated solid food.  Her pain was at a 3 out of 10 compared to 10 out of 10 on admission.  She was felt stable for discharge to home and is allowed discharge with instructions to follow up with Dr. Olevia Perches on Jul 12, 2010, at 9:00 a.m. and to call for any problems in the interim.  DIET:  Low residue.  DISCHARGE MEDICATIONS: 1. Phenergan 25 mg q.4-6 h as needed for nausea, vomiting. 2. Vicodin 5/500 one every 4 to 6 hours as needed for pain. 3. Prednisone 40 mg  p.o. daily.  She is instructed to stay at 40 mg     until she is seen in the office for return visit.  Other medications, same as on admission, include: 1. Fluticasone spray daily 50 mcg. 2. Amlodipine 10 mg daily. 3. Azathioprine 50 mg 3 tablets daily. 4. Bentyl 20 mg twice daily. 5. Benzonatate 200 mg 3 times daily as needed. 6. Vitamin B12 injections monthly. 7. Flexeril 10 mg q.h.s. p.r.n. 8. Lomotil p.r.n. 9. Lunesta 2 mg q.h.s. 10.Omeprazole 20 mg daily. 11.ProAir inhaler 2 puffs q.4 h as needed. 12.Remicade 10 mg/kg q.8 weeks. 13.Symbicort 80/4.5 two puffs twice daily.     Amy Esterwood, PA-C   ______________________________ [name], MD,FACG    AE/MEDQ  D:  06/25/2010  T:  06/25/2010  Job:  [id number]  Electronically Signed by AMY ESTERWOOD PA-C on 07/15/2010 04:51:15 PM Electronically Signed by Silvano Rusk MDFACG on 07/30/2010 03:13:20 PM

## 2010-07-30 NOTE — Telephone Encounter (Signed)
Short stay needs updated Remicade orders. Orders sent.

## 2010-08-05 ENCOUNTER — Other Ambulatory Visit: Payer: Self-pay | Admitting: *Deleted

## 2010-08-05 MED ORDER — DICYCLOMINE HCL 20 MG PO TABS: 20.0000 mg | ORAL_TABLET | Freq: Two times a day (BID) | ORAL | Status: DC

## 2010-08-06 ENCOUNTER — Telehealth: Payer: Self-pay | Admitting: *Deleted

## 2010-08-06 NOTE — Telephone Encounter (Signed)
Dr Olevia Perches called stating that patient's surgeon will be performing surgery on her at the end of June 2012. Dr Olevia Perches would like patient's next Remicade infusion in July (09/24/10) to be cancelled and she may restart infusions as usual the month thereafter. I have spoken with Marcie Bal at [facility] to cancel the 09/24/10 appointment for Remicade. I have explained to Marcie Bal that patient may restart the Remicade as usual the month thereafter. Dr Olevia Perches would also like patient to discontinue Imuran for now and decrease her prednisone from 30 mg down to 20 mg x 1 week, then to 15 mg x 1 week, then 10 mg and stay on 10 mg dosing. I have spoken to patient and have given her specific instructions to discontinue Imuran, decrease prednisone (gave taper instructions as per Dr Olevia Perches) and hold Remicade in July. Patient verbalizes understanding. She states that she has an appointment with Dr Olevia Perches for follow up in June and will be keeping that appointment.

## 2010-08-08 NOTE — Telephone Encounter (Signed)
Reviewed

## 2010-08-09 ENCOUNTER — Ambulatory Visit (INDEPENDENT_AMBULATORY_CARE_PROVIDER_SITE_OTHER): Payer: PRIVATE HEALTH INSURANCE | Admitting: Internal Medicine

## 2010-08-09 DIAGNOSIS — E538 Deficiency of other specified B group vitamins: Secondary | ICD-10-CM

## 2010-08-09 DIAGNOSIS — K56609 Unspecified intestinal obstruction, unspecified as to partial versus complete obstruction: Secondary | ICD-10-CM

## 2010-08-09 DIAGNOSIS — K5000 Crohn's disease of small intestine without complications: Secondary | ICD-10-CM

## 2010-08-11 ENCOUNTER — Other Ambulatory Visit: Payer: Self-pay | Admitting: *Deleted

## 2010-08-11 MED ORDER — PROMETHAZINE HCL 25 MG PO TABS: 25.0000 mg | ORAL_TABLET | Freq: Four times a day (QID) | ORAL | Status: DC | PRN

## 2010-08-11 MED ORDER — HYDROCODONE-ACETAMINOPHEN 5-500 MG PO CAPS: ORAL_CAPSULE | ORAL | Status: DC

## 2010-08-12 ENCOUNTER — Encounter: Payer: Self-pay | Admitting: Pulmonary Disease

## 2010-08-12 ENCOUNTER — Ambulatory Visit (INDEPENDENT_AMBULATORY_CARE_PROVIDER_SITE_OTHER): Payer: PRIVATE HEALTH INSURANCE | Admitting: Pulmonary Disease

## 2010-08-12 ENCOUNTER — Telehealth: Payer: Self-pay | Admitting: Pulmonary Disease

## 2010-08-12 VITALS — BP 142/82 | HR 111 | Temp 98.7°F | Ht 68.0 in | Wt 221.8 lb

## 2010-08-12 DIAGNOSIS — J209 Acute bronchitis, unspecified: Secondary | ICD-10-CM

## 2010-08-12 MED ORDER — LEVOFLOXACIN 750 MG PO TABS: 750.0000 mg | ORAL_TABLET | Freq: Every day | ORAL | Status: AC

## 2010-08-12 MED ORDER — BUDESONIDE-FORMOTEROL FUMARATE 80-4.5 MCG/ACT IN AERO: 2.0000 | INHALATION_SPRAY | Freq: Two times a day (BID) | RESPIRATORY_TRACT | Status: DC

## 2010-08-12 NOTE — Patient Instructions (Signed)
Will treat with levaquin 736m one each day for 7days Stay on symbicort and albuterol as needed. If you are not better by the beginning of next week, please call [place]  If you get worse this weekend, need to call as well.  followup with Dr.Alva in 2 weeks.

## 2010-08-12 NOTE — Telephone Encounter (Signed)
Called, spoke with pt.  She c/o prod cough with yellow mucus, fever, increased SOB x 3 days.  Requesting OV - scheduled with [facility] today at 10:45am -- pt aware.

## 2010-08-12 NOTE — Progress Notes (Signed)
  Subjective:    Patient ID: [name], female    DOB: 07/29/1960, 50 y.o.   MRN: [id number]  HPI The pt is a 50y/o female who comes in today for an acute sick visit.  She is usually followed by Dr. Elsworth Soho for cough and ?asthma.  She gives a 4d h/o increasing chest congestion, cough with purulent mucus, worsening sob, rattling, and fever to 101 yest.  She is on an extensive immunosuppression regimen for her Crohn's.  She denies sx of IBD flare, and no sx for a UTI.     Review of Systems  Constitutional: Positive for fever and unexpected weight change.  HENT: Positive for rhinorrhea and sneezing. Negative for ear pain, nosebleeds, congestion, sore throat, trouble swallowing, dental problem, postnasal drip and sinus pressure.   Eyes: Negative for redness and itching.  Respiratory: Positive for cough, chest tightness, shortness of breath and wheezing.   Cardiovascular: Negative for palpitations and leg swelling.  Gastrointestinal: Positive for nausea. Negative for vomiting.  Genitourinary: Negative for dysuria.  Musculoskeletal: Negative for joint swelling.  Skin: Negative for rash.  Neurological: Negative for headaches.  Hematological: Does not bruise/bleed easily.  Psychiatric/Behavioral: Negative for dysphoric mood. The patient is not nervous/anxious.        Objective:   Physical Exam Obese female in nad.  No increased wob Nares patent, no discharge or purulence Op without lesions or exudates. Chest with loud upper airway wheezing and noise, more central.  No crackles or true wheezing.  Excellent airflow. Cor with minimal tachy, no murmurs LE with no edema, no cyanosis  Alert, oriented, moves all 4        Assessment & Plan:

## 2010-08-12 NOTE — Assessment & Plan Note (Signed)
The pt's history is most c/w acute bronchitis.  It is unclear if this is viral or bacterial, but with her extensive immunosuppression regimen and h/o fever, I think we need to treat her with a broad spectrum abx.  She understands that if she is not getting better, needs to let us know.  Will need a cxr if doesn't improve.

## 2010-08-19 ENCOUNTER — Encounter: Payer: Self-pay | Admitting: Internal Medicine

## 2010-08-19 ENCOUNTER — Ambulatory Visit (INDEPENDENT_AMBULATORY_CARE_PROVIDER_SITE_OTHER): Payer: PRIVATE HEALTH INSURANCE | Admitting: Internal Medicine

## 2010-08-19 VITALS — BP 110/74 | HR 68 | Ht 68.0 in | Wt 221.6 lb

## 2010-08-19 DIAGNOSIS — K5000 Crohn's disease of small intestine without complications: Secondary | ICD-10-CM

## 2010-08-19 DIAGNOSIS — K56609 Unspecified intestinal obstruction, unspecified as to partial versus complete obstruction: Secondary | ICD-10-CM

## 2010-08-19 DIAGNOSIS — R933 Abnormal findings on diagnostic imaging of other parts of digestive tract: Secondary | ICD-10-CM

## 2010-08-19 MED ORDER — HYDROCODONE-ACETAMINOPHEN 5-500 MG PO CAPS: ORAL_CAPSULE | ORAL | Status: DC

## 2010-08-19 NOTE — Patient Instructions (Addendum)
We will refill your hydrocodone. CC: Dr Kalman Drape, Dr Donne Hazel

## 2010-08-19 NOTE — Progress Notes (Signed)
[name] 10-21-1960 MRN [id number]      History of Present Illness:  This is a 50 year old white female with Crohn's disease of the small bowel and colon. Her last office visit was 07/22/2010. She is status post terminal ileal resection in August 2003. She has stricturing of the neo-ileum despite intensive medical therapy which has included Imuran, Remicade and steroids. A small bowel follow-through has shown a short 1 cm stricture of the distal ileum at the anastomosis with leaking and mild proximal dilatation of the small bowel. Contrast reached the cecum in 4 hours. She has not responded to medical therapy and is scheduled to undergo a small bowel resection by Dr. Donne Hazel on July 11. I have discussed the case with Dr. Donne Hazel. We are planning to hold her Remicade infusion on July 20th and taper her prednisone as much as we can down to 10 mg daily. She is currently on 15 mg. She has been steroid dependent for a long time so the prednisone taper has to be very slow  She has discontinued her Imuran. Her main symptoms are diarrhea and abdominal pain. She also has chronic nausea.Additional medical problems include psoriasis, depression and gastroesophageal reflux.   Past Medical History  Diagnosis Date  . Crohn's disease   . Depression   . Personal history of other diseases of digestive system   . Hiatal hernia   . Adenomatous colon polyp   . GERD (gastroesophageal reflux disease)   . Vitamin B12 deficiency   . SBO (small bowel obstruction)   . Psoriasis   . Gastritis    Past Surgical History  Procedure Date  . Partial hysterectomy   . Ileocecectomy 10/2008  . Carpal tunnel release     right  . Anal fissure repair   . Cholecystectomy   . Appendectomy     reports that she quit smoking about 26 years ago. Her smoking use included Cigarettes. She has a 10 pack-year smoking history. She has never used smokeless tobacco. She reports that she does not drink alcohol or use  illicit drugs. family history includes Breast cancer in an unspecified family member; Colon cancer in her mother; and Colon polyps in her brother, father, and mother. Allergies  Allergen Reactions  . Codeine         Review of Systems: Positive for diarrhea and abdominal pain, denies fever vomiting  The remainder of the 10  point ROS is negative except as outlined in H&P   Physical Exam: General appearance  Well developed, in no distress, cushingoid features. Eyes- non icteric. HEENT nontraumatic, normocephalic. Mouth no lesions, tongue papillated, she has cheilosis. Neck supple without adenopathy, thyroid not enlarged, no carotid bruits, no JVD. Lungs Clear to auscultation bilaterally. Cor normal S1 normal S2, regular rhythm , no murmur,  quiet precordium. Abdomen truncal obesity. Normoactive bowel sounds. Diffuse tenderness. No rebound. Rectal: Not done. Extremities no pedal edema. Skin no lesions. Neurological alert and oriented x 3. Psychological normal mood and affect.  Assessment and Plan:   Problem #1 Distal small bowel obstruction from Crohn's disease at the neo ileal anastomosis. She is scheduled for a resection or stricturoplasty as per Dr. Donne Hazel on 09/15/2010. All immunosuppressants have been minimized or stopped. She will have to get back on them after the surgery. She may need a steroid bolus going into surgery because of her chronic adrenal suppression. We will be happy to see her in consultation while she is in the hospital. Today we will refill her  hydrocodone.  08/19/2010 [name]

## 2010-08-23 ENCOUNTER — Encounter (INDEPENDENT_AMBULATORY_CARE_PROVIDER_SITE_OTHER): Payer: Self-pay | Admitting: General Surgery

## 2010-08-26 ENCOUNTER — Other Ambulatory Visit: Payer: Self-pay | Admitting: *Deleted

## 2010-08-26 MED ORDER — PROMETHAZINE HCL 25 MG PO TABS: 25.0000 mg | ORAL_TABLET | Freq: Four times a day (QID) | ORAL | Status: DC | PRN

## 2010-08-27 ENCOUNTER — Encounter: Payer: Self-pay | Admitting: Pulmonary Disease

## 2010-08-27 ENCOUNTER — Ambulatory Visit (INDEPENDENT_AMBULATORY_CARE_PROVIDER_SITE_OTHER): Payer: PRIVATE HEALTH INSURANCE | Admitting: Pulmonary Disease

## 2010-08-27 DIAGNOSIS — J45909 Unspecified asthma, uncomplicated: Secondary | ICD-10-CM

## 2010-08-27 NOTE — Progress Notes (Signed)
  Subjective:    Patient ID: [name], female    DOB: 1960/05/11, 50 y.o.   MRN: [id number]  HPI  49/F, non smoker with crohn's for FU  of 'asthma'  asthma since teens, mild intermittent, not bothered x 10 yrs  Caught a cold in july & could not get rid of it  Cough, most prominent symptom, clear phlegm in spells x 1 hr  Hoarse in evenings, wheezing at night & morning  She has been on benazepril x 3 months  h/o crohn's since 2009, down to 15 mg prednisone now, started remicaide (in place of humira) in aug' 11  Spirometry showed mild airway obstruction with FEV1 81 , ratio 71  She is on omeprazole for GERD, reports mild post nasal drip.  Sick visit 08/12/10 She gives a 4d h/o increasing chest congestion, cough with purulent mucus, worsening sob, rattling, and fever to 101 yest. She is on an extensive immunosuppression regimen for her Crohn's. She denies sx of IBD flare, and no sx for a UTI>> given levaquin   08/27/2010 Surgery schdeuled - dr Donne Hazel  - for 7/11 C/o recurrent thrush      Review of Systems Pt denies any significant  nasal congestion or excess secretions, fever, chills, sweats, unintended wt loss, pleuritic or exertional cp, orthopnea pnd or leg swelling.  Pt also denies any obvious fluctuation in symptoms with weather or environmental change or other alleviating or aggravating factors.    Pt denies any increase in rescue therapy over baseline, denies waking up needing it or having early am exacerbations or coughing/wheezing/ or dyspnea       Objective:   Physical Exam Gen. Pleasant, well-nourished, in no distress ENT - no lesions, no post nasal drip Neck: No JVD, no thyromegaly, no carotid bruits Lungs: no use of accessory muscles, no dullness to percussion, clear without rales or rhonchi  Cardiovascular: Rhythm regular, heart sounds  normal, no murmurs or gallops, no peripheral edema Musculoskeletal: No deformities, no cyanosis or clubbing          Assessment & Plan:

## 2010-08-27 NOTE — Patient Instructions (Signed)
You should get a shot of hydrocortisone on day of surgery (stress dose) Stay on symbicort for now Once recovered from surgery & back on prednisone ,trial of stopping symbicort to see if thrush gets better Use albuterol 2 puffs as needed instead

## 2010-08-30 DIAGNOSIS — J45909 Unspecified asthma, uncomplicated: Secondary | ICD-10-CM | POA: Insufficient documentation

## 2010-08-30 NOTE — Assessment & Plan Note (Addendum)
Recent tracheobronchitis - resolved Stay on symbicort for now Once recovered from surgery & back on prednisone ,trial of stopping symbicort to see if thrush gets better Use albuterol 2 puffs as needed instead OK to proceed with surgery -Stress dose hydrocortisone on day of surgery & post op - 100 mg q 8h

## 2010-08-31 ENCOUNTER — Ambulatory Visit (INDEPENDENT_AMBULATORY_CARE_PROVIDER_SITE_OTHER): Payer: PRIVATE HEALTH INSURANCE | Admitting: General Surgery

## 2010-08-31 ENCOUNTER — Encounter (INDEPENDENT_AMBULATORY_CARE_PROVIDER_SITE_OTHER): Payer: Self-pay | Admitting: General Surgery

## 2010-08-31 VITALS — BP 146/80 | HR 76 | Temp 97.6°F | Ht 68.0 in | Wt 219.0 lb

## 2010-08-31 DIAGNOSIS — J329 Chronic sinusitis, unspecified: Secondary | ICD-10-CM

## 2010-08-31 DIAGNOSIS — Z789 Other specified health status: Secondary | ICD-10-CM

## 2010-08-31 DIAGNOSIS — K219 Gastro-esophageal reflux disease without esophagitis: Secondary | ICD-10-CM

## 2010-08-31 DIAGNOSIS — Z98811 Dental restoration status: Secondary | ICD-10-CM

## 2010-08-31 DIAGNOSIS — K5000 Crohn's disease of small intestine without complications: Secondary | ICD-10-CM

## 2010-08-31 DIAGNOSIS — K589 Irritable bowel syndrome without diarrhea: Secondary | ICD-10-CM

## 2010-08-31 DIAGNOSIS — J349 Unspecified disorder of nose and nasal sinuses: Secondary | ICD-10-CM | POA: Insufficient documentation

## 2010-08-31 DIAGNOSIS — K649 Unspecified hemorrhoids: Secondary | ICD-10-CM

## 2010-08-31 DIAGNOSIS — Z972 Presence of dental prosthetic device (complete) (partial): Secondary | ICD-10-CM

## 2010-08-31 DIAGNOSIS — K625 Hemorrhage of anus and rectum: Secondary | ICD-10-CM

## 2010-08-31 DIAGNOSIS — R109 Unspecified abdominal pain: Secondary | ICD-10-CM | POA: Insufficient documentation

## 2010-08-31 DIAGNOSIS — R635 Abnormal weight gain: Secondary | ICD-10-CM

## 2010-08-31 DIAGNOSIS — K449 Diaphragmatic hernia without obstruction or gangrene: Secondary | ICD-10-CM

## 2010-08-31 DIAGNOSIS — Z973 Presence of spectacles and contact lenses: Secondary | ICD-10-CM | POA: Insufficient documentation

## 2010-08-31 DIAGNOSIS — R112 Nausea with vomiting, unspecified: Secondary | ICD-10-CM | POA: Insufficient documentation

## 2010-08-31 NOTE — Progress Notes (Signed)
Subjective:     Patient ID: [name], female   DOB: [birth year], 50 y.o.   MRN: [id number]    BP 146/80  Pulse 76  Temp(Src) 97.6 F (36.4 C) (Temporal)  Ht 5' 8"  (1.727 m)  Wt 219 lb (99.338 kg)  BMI 33.30 kg/m2    HPI This is a 50 year old female with a long-standing history of Crohn's disease. I initially met her in 2010  with a stricture of her terminal ileum that had been unresponsive to medical therapy. Identically the operating room and August of 2010 and performed a laparoscopic-assisted ileocecectomy. In my operative note at that time I noted the tissue is very friable with some chronic Crohn's disease. She did well even though she was on high-dose steroids at that point was discharged home on postoperative day 5. She did well until about 6 or 7 months ago. She had been treated medically for what appeared to be a flareup of her Crohn's disease. She then eventually had a small bowel endoscopy as well as a small bowel follow-through which shows a very short segment of focal narrowing and beaking of her terminal ileum at its junction with the right colon. Proximal to this small bowel loops are mildly dilated suggesting chronic low-grade partial small bowel obstruction. She does continue to have vomiting although she is passing gas and bowel movements but has some right lower quadrant pain associated with the inability really to tolerate the normal amount of oral intake that she needs. She was on a large dose of any immunosuppressants including Remicade Imuran and her prednisone where I first saw her. Discuss with Dr. Olevia Perches try to get her off of some these medications which she's only now on her prednisone. She otherwise doing okay right now except for the continued nausea and vomiting. Review of Systems  Constitutional: Positive for fatigue and unexpected weight change (steroid).  HENT: Negative.   Respiratory: Positive for cough (recent tracheobronchitis now doing well).     Cardiovascular: Negative.   Genitourinary: Negative.   Musculoskeletal: Negative.   Neurological: Negative.   Hematological: Negative.    Past Medical History  Diagnosis Date  . Crohn's disease   . Depression   . Personal history of other diseases of digestive system   . Hiatal hernia   . Adenomatous colon polyp   . GERD (gastroesophageal reflux disease)   . Vitamin B12 deficiency   . SBO (small bowel obstruction)     partial secondary to crohns stricture  . Psoriasis   . Gastritis    Past Surgical History  Procedure Date  . Partial hysterectomy   . Ileocecectomy 10/2008  . Carpal tunnel release     right  . Anal fissure repair   . Cholecystectomy   . Colon surgery     lap assisted ileocecectomy for crohns       Objective:   Physical Exam  Constitutional: She appears well-developed and well-nourished.  Neck: Neck supple.  Cardiovascular: Normal rate, regular rhythm and normal heart sounds.   Pulmonary/Chest: Effort normal and breath sounds normal.  Abdominal: Soft. Normal appearance and bowel sounds are normal. She exhibits no distension. There is tenderness (mild tenderness rlq) in the right lower quadrant. No hernia.    Lymphadenopathy:    She has no cervical adenopathy.       Assessment:     Chronic Crohns disease with psbo    Plan:        She still has a stricture in her  terminal ileum that is not amenable to medical therapy from her Crohn's disease. She is off most of her immunosuppressants at this point is just on prednisone. I discussed with her again today going to the operating room. We discussed a laparoscopic or stricture plasty or resection with a re anastomosis. We discussed the risk being but not limited to open procedure, anastomotic leak requiring operation and an ostomy, difficulties of hernias over the long-term, DVT, PE, and other pulmonary and cardiac complications. I think she is best prepared for this operation at this point and there is no  reason to continue waiting to do this and I procedure for July 11. She understands all of these risks.

## 2010-08-31 NOTE — Patient Instructions (Signed)
Do not need to use prep for surgery.

## 2010-09-02 ENCOUNTER — Telehealth: Payer: Self-pay | Admitting: Internal Medicine

## 2010-09-03 NOTE — Telephone Encounter (Signed)
Pt with hx of Crohn's, stricture of terminal ileum leading to lap assisted ileocecetomy with problems of SBO. Pt saw Dr Donne Hazel on 08/31/10 who will do stricture plasty or resection with re-anastomosis on 09/15/10. She remains on Prednisone. Pt reports blood in her stools all week- not every stool. When she does have blood, it is BRB. Pt given an appt on Monday with Dr Olevia Perches and for worsening of her bleeding or other problems; pt stated understanding.

## 2010-09-06 ENCOUNTER — Encounter: Payer: Self-pay | Admitting: Internal Medicine

## 2010-09-06 ENCOUNTER — Ambulatory Visit (INDEPENDENT_AMBULATORY_CARE_PROVIDER_SITE_OTHER): Payer: PRIVATE HEALTH INSURANCE | Admitting: Internal Medicine

## 2010-09-06 VITALS — BP 124/76 | HR 88 | Ht 66.0 in | Wt 220.0 lb

## 2010-09-06 DIAGNOSIS — K5090 Crohn's disease, unspecified, without complications: Secondary | ICD-10-CM

## 2010-09-06 DIAGNOSIS — K625 Hemorrhage of anus and rectum: Secondary | ICD-10-CM

## 2010-09-06 DIAGNOSIS — K5000 Crohn's disease of small intestine without complications: Secondary | ICD-10-CM

## 2010-09-06 DIAGNOSIS — K648 Other hemorrhoids: Secondary | ICD-10-CM

## 2010-09-06 DIAGNOSIS — E538 Deficiency of other specified B group vitamins: Secondary | ICD-10-CM

## 2010-09-06 DIAGNOSIS — K649 Unspecified hemorrhoids: Secondary | ICD-10-CM

## 2010-09-06 DIAGNOSIS — K56609 Unspecified intestinal obstruction, unspecified as to partial versus complete obstruction: Secondary | ICD-10-CM

## 2010-09-06 MED ORDER — METRONIDAZOLE 250 MG PO TABS: 250.0000 mg | ORAL_TABLET | Freq: Two times a day (BID) | ORAL | Status: AC

## 2010-09-06 MED ORDER — MESALAMINE 1000 MG RE SUPP: 1000.0000 mg | Freq: Two times a day (BID) | RECTAL | Status: DC

## 2010-09-06 NOTE — Progress Notes (Signed)
[name] 10-26-1960 MRN [id number]        History of Present Illness:  This is a 50 year old white female with severe Crohn's disease of the small bowel and colon;  she is scheduled for surgical resection of an  anastomotic stricture of neo ileum  on 09/15/2010 by Dr. Donne Hazel. We have tapered off her prednisone to 10 mg daily. She is also discontinued her 6 MP and her Remicade in preparation for her surgery. She is now  having rectal bleeding and chronic diarrhea. 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   Past Medical History  Diagnosis Date  . Crohn's disease   . Depression   . Personal history of other diseases of digestive system   . Hiatal hernia   . Adenomatous colon polyp   . GERD (gastroesophageal reflux disease)   . Vitamin B12 deficiency   . SBO (small bowel obstruction)     partial secondary to crohns stricture  . Psoriasis   . Gastritis    Past Surgical History  Procedure Date  . Partial hysterectomy   . Ileocecectomy 10/2008  . Carpal tunnel release     right  . Anal fissure repair   . Cholecystectomy   . Colon surgery     lap assisted ileocecectomy for crohns    reports that she quit smoking about 26 years ago. Her smoking use included Cigarettes. She has a 10 pack-year smoking history. She has never used smokeless tobacco. She reports that she does not drink alcohol or use illicit drugs. family history includes Breast cancer in an unspecified family member; Colon cancer in her mother; and Colon polyps in her brother, father, and mother. Allergies  Allergen Reactions  . Codeine Hives and Nausea And Vomiting        Review of Systems: Positive for diarrhea, crampy abdominal pain, of nephrology [place]. Denies gastroesophageal reflux  The remainder of the 10  point ROS is negative except as outlined in H&P   Physical Exam: General appearance  Well developed in no distress, with  cushingoid features Eyes- non icteric HEENT nontraumatic, normocephalic Mouth no lesions, tongue papillated,  Cheilosis present Neck supple without adenopathy, thyroid not enlarged, no carotid bruits, no JVD Lungs Clear to auscultation bilaterally Cor normal S1 normal S2, regular rhythm , no murmur,  quiet precordium Abdomen diffusely tender with normoactive bowel sounds. Truncal obesity Rectal and anoscopic exam reveals normal perianal area. Increased rectal sphincter tone. Beefy-red inflamed internal hemorrhoids , very tender and edematous, no prolapse. Stool is  trace Hemoccult-positive Extremities no pedal edema Skin no lesions Neurological alert and oriented x3 Psychological normal mood and  affect  Assessment and Plan:  Problems #1 Crohn's disease of the neo-ileum colon and rectum. Anticipating  surgical resection next week. Patient has tapered off her medications in order  to optimize the outcome of the surgery  Problem #2 Rectal bleeding clearly due to hemorrhoids. We will start Canasa suppositories thousand milligrams twice a day and Flagyl 250 mg by mouth twice a day which can be discontinued the day prior to the surgery. She will need a steroid bolus prior to surgery to cover her for adrenal insufficiency. We will be happy to see her in the hospital for consultation    09/06/2010 [name]

## 2010-09-06 NOTE — Patient Instructions (Addendum)
Today we have sent Flagyl 235m one by mouth twice a day and Canasa 10040minsert one into rectum twice a day. You were given your b12 injection today, please make your next b12 appt for one month.  Dr MaMauricio Po

## 2010-09-09 ENCOUNTER — Telehealth (INDEPENDENT_AMBULATORY_CARE_PROVIDER_SITE_OTHER): Payer: Self-pay

## 2010-09-09 ENCOUNTER — Other Ambulatory Visit (INDEPENDENT_AMBULATORY_CARE_PROVIDER_SITE_OTHER): Payer: Self-pay | Admitting: General Surgery

## 2010-09-09 ENCOUNTER — Other Ambulatory Visit: Payer: Self-pay | Admitting: *Deleted

## 2010-09-09 ENCOUNTER — Encounter (HOSPITAL_COMMUNITY): Payer: PRIVATE HEALTH INSURANCE

## 2010-09-09 LAB — DIFFERENTIAL
Eosinophils Relative: 0 % (ref 0–5)
Lymphs Abs: 3.4 10*3/uL (ref 0.7–4.0)
Monocytes Absolute: 1.1 10*3/uL — ABNORMAL HIGH (ref 0.1–1.0)
Neutro Abs: 6.4 10*3/uL (ref 1.7–7.7)

## 2010-09-09 LAB — CBC
Hemoglobin: 13.2 g/dL (ref 12.0–15.0)
Platelets: 240 10*3/uL (ref 150–400)
RBC: 4.08 MIL/uL (ref 3.87–5.11)

## 2010-09-09 LAB — COMPREHENSIVE METABOLIC PANEL
ALT: 19 U/L (ref 0–35)
AST: 17 U/L (ref 0–37)
Alkaline Phosphatase: 72 U/L (ref 39–117)
CO2: 29 mEq/L (ref 19–32)
GFR calc Af Amer: >60 mL/min (ref >60)
GFR calc non Af Amer: >60 mL/min (ref >60)
Glucose, Bld: 105 mg/dL — ABNORMAL HIGH (ref 70–99)
Potassium: 2.7 mEq/L — CL (ref 3.5–5.1)
Sodium: 141 mEq/L (ref 135–145)
Total Protein: 7.9 g/dL (ref 6.0–8.3)

## 2010-09-09 LAB — SURGICAL PCR SCREEN: Staphylococcus aureus: NEGATIVE

## 2010-09-09 MED ORDER — POTASSIUM CHLORIDE ER 10 MEQ PO TBCR: 20.0000 meq | EXTENDED_RELEASE_TABLET | Freq: Two times a day (BID) | ORAL | Status: DC

## 2010-09-09 MED ORDER — PROMETHAZINE HCL 25 MG PO TABS: 25.0000 mg | ORAL_TABLET | Freq: Four times a day (QID) | ORAL | Status: DC | PRN

## 2010-09-09 NOTE — Telephone Encounter (Signed)
Spoke to pt about her preop labs being abnormal with the potassium being low. Pt advised to get a Rx for KCL 61mq x 2days per Dr WDonne Hazeland pt will get a Bmet repeated the day of sx. Dr WDonne Hazelplaced the Rx order thru e-file on the computer. /Freida Busman7-5-12

## 2010-09-09 NOTE — Telephone Encounter (Signed)
Per Dr Olevia Perches, patient may have additional #30 phenergan (last rx given for #30 on 08/26/10) until her surgery on 09/16/10. Rx sent.

## 2010-09-15 ENCOUNTER — Other Ambulatory Visit (INDEPENDENT_AMBULATORY_CARE_PROVIDER_SITE_OTHER): Payer: Self-pay | Admitting: General Surgery

## 2010-09-15 ENCOUNTER — Inpatient Hospital Stay (HOSPITAL_COMMUNITY)
Admission: RE | Admit: 2010-09-15 | Discharge: 2010-09-22 | DRG: 330 | Disposition: A | Discharge status: Home / Self Care | Payer: PRIVATE HEALTH INSURANCE | Source: Ambulatory Visit | Attending: General Surgery | Admitting: General Surgery

## 2010-09-15 DIAGNOSIS — Z5331 Laparoscopic surgical procedure converted to open procedure: Secondary | ICD-10-CM

## 2010-09-15 DIAGNOSIS — R112 Nausea with vomiting, unspecified: Secondary | ICD-10-CM | POA: Diagnosis not present

## 2010-09-15 DIAGNOSIS — K560 Paralytic ileus: Secondary | ICD-10-CM | POA: Diagnosis not present

## 2010-09-15 DIAGNOSIS — K5669 Other intestinal obstruction: Secondary | ICD-10-CM | POA: Diagnosis present

## 2010-09-15 DIAGNOSIS — Z01812 Encounter for preprocedural laboratory examination: Secondary | ICD-10-CM

## 2010-09-15 DIAGNOSIS — D126 Benign neoplasm of colon, unspecified: Secondary | ICD-10-CM

## 2010-09-15 DIAGNOSIS — K219 Gastro-esophageal reflux disease without esophagitis: Secondary | ICD-10-CM | POA: Diagnosis present

## 2010-09-15 DIAGNOSIS — I10 Essential (primary) hypertension: Secondary | ICD-10-CM | POA: Diagnosis present

## 2010-09-15 DIAGNOSIS — L408 Other psoriasis: Secondary | ICD-10-CM | POA: Diagnosis present

## 2010-09-15 DIAGNOSIS — K5000 Crohn's disease of small intestine without complications: Principal | ICD-10-CM | POA: Diagnosis present

## 2010-09-15 DIAGNOSIS — E876 Hypokalemia: Secondary | ICD-10-CM | POA: Diagnosis not present

## 2010-09-15 HISTORY — PX: OTHER SURGICAL HISTORY: SHX169

## 2010-09-15 LAB — BASIC METABOLIC PANEL
Calcium: 9.3 mg/dL (ref 8.4–10.5)
Calcium: 8.6 mg/dL (ref 8.4–10.5)
Chloride: 100 mEq/L (ref 96–112)
GFR calc Af Amer: >60 mL/min (ref >60)
GFR calc Af Amer: >60 mL/min (ref >60)
GFR calc Af Amer: >60 mL/min (ref >60)
GFR calc non Af Amer: >60 mL/min (ref >60)
GFR calc non Af Amer: >60 mL/min (ref >60)
GFR calc non Af Amer: >60 mL/min (ref >60)
Potassium: 2.7 mEq/L — CL (ref 3.5–5.1)
Potassium: 3.4 mEq/L — ABNORMAL LOW (ref 3.5–5.1)
Sodium: 140 mEq/L (ref 135–145)
Sodium: 138 mEq/L (ref 135–145)

## 2010-09-15 LAB — TYPE AND SCREEN
ABO/RH(D): O POS
Antibody Screen: NEGATIVE

## 2010-09-16 LAB — CBC
HCT: 31.6 % — ABNORMAL LOW (ref 36.0–46.0)
Hemoglobin: 10.3 g/dL — ABNORMAL LOW (ref 12.0–15.0)
RDW: 13.6 % (ref 11.5–15.5)
WBC: 8.6 10*3/uL (ref 4.0–10.5)

## 2010-09-16 LAB — BASIC METABOLIC PANEL
Chloride: 102 mEq/L (ref 96–112)
GFR calc Af Amer: >60 mL/min (ref >60)
GFR calc non Af Amer: >60 mL/min (ref >60)
Potassium: 3.3 mEq/L — ABNORMAL LOW (ref 3.5–5.1)
Sodium: 139 mEq/L (ref 135–145)

## 2010-09-16 NOTE — Op Note (Signed)
NAMESHAVON, ASHMORE            ACCOUNT NO.:  [id number]  MEDICAL RECORD NO.:  [id number]  LOCATION:  0008                         FACILITY:  [facility]  PHYSICIAN:  Dickey Gave, MDDATE OF BIRTH:  [birth year]  DATE OF PROCEDURE:  09/15/2010 DATE OF DISCHARGE:                              OPERATIVE REPORT   PREOPERATIVE DIAGNOSIS:  Crohn disease status post laparoscopic ileocecectomy, now with anastomotic stricture.  POSTOPERATIVE DIAGNOSIS:  Crohn disease status post laparoscopic ileocecectomy, now with anastomotic stricture.  PROCEDURE:  Diagnostic laparoscopy converted to open right colectomy.  SURGEON:  Mila Homer. Donne Hazel, MD  ASSISTANT:  Coralie Keens, M.D.  ANESTHESIA:  General.  SPECIMENS:  Right colon to pathology.  ESTIMATED BLOOD LOSS:  100 cc.  COMPLICATIONS:  None.  DISPOSITION:  To recovery room in stable condition.  INDICATIONS:  This is a 50 year old female who has had fairly difficult to control Crohn disease.  I know her from 2010 where I did a laparoscopic assisted ileocecectomy.  She did well for a while after this and a couple years later she began to have some significant symptoms with nausea, vomiting and abdominal pain.  This was thought to be due to her Crohn disease.  She was increased on her immunosuppressants and never really got a whole lot better.  Eventually, she underwent a small-bowel followthrough which showed a very focal segment of narrowing and beaking of terminal ileum at its junction with the cecum.  I then discussed with Dr. Olevia Perches and her trying to back down on some of her immunosuppression but we all agreed that due to her symptoms and appearance on small bowel follow-through, we would need to likely resect her anastomosis.  I discussed a laparoscopic small bowel resection with possible colectomy associated with this as well.  She understood the risks prior to this procedure.  DESCRIPTION OF PROCEDURE:  After  informed consent was obtained, the patient was taken to the operating.  She was administered 100 mg of hydrocortisone, 1 gram of Invanz, 5000 units of subcutaneous heparin and Entereg preoperatively.  She was then placed under general anesthesia without complication and orogastric tube and Foley were placed.  Her abdomen was prepped and draped in standard sterile surgical fashion. Surgical time-out was then performed.  I then accessed her abdomen, the Optiview trocar on the left upper quadrant without difficulty.  This was then insufflated.  She had some very minor adhesions to her midline from her extraction site previously. I placed two further 5-mm trocars on the left side of the abdomen under direct vision after infiltration with local anesthetic without complication.  I then took these adhesions down bluntly.  I did place a clip overlying one vessel.  Following this, we then tried to mobilize this laparoscopically.  We spent about 30 minutes laparoscopically trying to identify the problem.  Due to her intra-abdominal fat, her omentum and the scar tissue that was present at her prior anastomosis, neither one of [place] felt that this will be safe to perform laparoscopically and her tissue was so friable that I was really unable to grasp it without being concerned that I was going to tear her bowel and her mesentery.  So I did convert  this to an open procedure and enlarged her incision.  I spent about 30 minutes, lysing some adhesions. Eventually we were able to identify the anastomosis which did feel like she had a stricture present at and it was tight.  I ran the remainder of her small bowel and this all appeared normal with not really any evidence of active Crohn disease or any other strictures present. Following this, we then dissected the anastomosis.  There was not much more colon prior to the hepatic flexure, so I decided to do a right colectomy as I was concerned if I just resected  the anastomosis and redid it, the blood supply to the anastomosis would not be adequate given its location at the hepatic flexure.  I then divided the terminal ileum at a very small amount to leave her as much bowel as I possibly could I then divided the proximal transverse colon with a stapler as well.  The mesentery was then divided with Kelly clamps and oversewn with silk ligatures due to its friability.  The colon was then removed and passed off the table as a specimen.  I then obtained hemostasis. Irrigation was performed.  I closed the mesentery and then put the small bowel next to the transverse colon.  This was approximated with two 3-0 silk sutures.  I then made enterotomies in both, fired a GIA stapler down those, this was hemostatic.  I then closed the common enterotomy with a TA stapler.  I placed 2 crotch stitches of 3-0 silk.  I closed the mesenteric defect with 2-0 silk.  I oversewed the staple line with 3- 0 silk as well.  This was patent and looked healthy upon completion. This was placed back in the abdomen.  More irrigation was performed. Again the remainder of her bowel was clean without any real evidence of active Crohn disease currently.  I then placed the omentum overlying her anastomosis as well as underneath her incision.  I then closed her incision with #1 loop PDS placing #1 Novafil every 2 cm in interrupted fashion.  The wound was then irrigated.  I stapled the wounds close.  I placed wicks of Telfa inside the incision.  Sterile dressings were placed.  She tolerated this well, was extubated and transferred to recovery room in stable condition.     Dickey Gave, MD     MCW/MEDQ  D:  09/15/2010  T:  09/15/2010  Job:  [id number]  cc:   Lowella Bandy. Olevia Perches, [place] [address]  [address], [place] [address]  Electronically Signed by Rolm Bookbinder MD on 09/16/2010 04:29:38 PM

## 2010-09-17 LAB — BASIC METABOLIC PANEL
CO2: 30 mEq/L (ref 19–32)
Chloride: 100 mEq/L (ref 96–112)
GFR calc non Af Amer: >60 mL/min (ref >60)
Glucose, Bld: 123 mg/dL — ABNORMAL HIGH (ref 70–99)
Potassium: 3.5 mEq/L (ref 3.5–5.1)
Sodium: 135 mEq/L (ref 135–145)

## 2010-09-17 LAB — CBC
HCT: 28.8 % — ABNORMAL LOW (ref 36.0–46.0)
Hemoglobin: 9.5 g/dL — ABNORMAL LOW (ref 12.0–15.0)
RBC: 3 MIL/uL — ABNORMAL LOW (ref 3.87–5.11)
WBC: 8.3 10*3/uL (ref 4.0–10.5)

## 2010-09-18 LAB — CBC
HCT: 27.5 % — ABNORMAL LOW (ref 36.0–46.0)
Hemoglobin: 9.3 g/dL — ABNORMAL LOW (ref 12.0–15.0)
MCH: 31.6 pg (ref 26.0–34.0)
MCV: 93.5 fL (ref 78.0–100.0)
RBC: 2.94 MIL/uL — ABNORMAL LOW (ref 3.87–5.11)

## 2010-09-18 LAB — BASIC METABOLIC PANEL
BUN: 3 mg/dL — ABNORMAL LOW (ref 6–23)
CO2: 29 mEq/L (ref 19–32)
Calcium: 9.3 mg/dL (ref 8.4–10.5)
Glucose, Bld: 127 mg/dL — ABNORMAL HIGH (ref 70–99)
Sodium: 138 mEq/L (ref 135–145)

## 2010-09-21 ENCOUNTER — Telehealth: Payer: Self-pay | Admitting: *Deleted

## 2010-09-21 DIAGNOSIS — K5090 Crohn's disease, unspecified, without complications: Secondary | ICD-10-CM

## 2010-09-21 LAB — BASIC METABOLIC PANEL
CO2: 29 mEq/L (ref 19–32)
Calcium: 9.9 mg/dL (ref 8.4–10.5)
Creatinine, Ser: 0.6 mg/dL (ref 0.50–1.10)
Glucose, Bld: 110 mg/dL — ABNORMAL HIGH (ref 70–99)

## 2010-09-21 LAB — CBC
MCH: 31.6 pg (ref 26.0–34.0)
MCV: 91.7 fL (ref 78.0–100.0)
Platelets: 269 10*3/uL (ref 150–400)
RBC: 3.61 MIL/uL — ABNORMAL LOW (ref 3.87–5.11)

## 2010-09-21 MED ORDER — INFLIXIMAB 100 MG IV SOLR: INTRAVENOUS | Status: DC

## 2010-09-21 NOTE — Discharge Summary (Signed)
  NAMELEANNE, [name]            ACCOUNT NO.:  [id number]  MEDICAL RECORD NO.:  [id number]  LOCATION:  6503                         FACILITY:  [facility]  PHYSICIAN:  Dickey Gave, MDDATE OF BIRTH:  1960-09-12  DATE OF ADMISSION:  09/15/2010 DATE OF DISCHARGE:                              DISCHARGE SUMMARY   ADMISSION DIAGNOSES: 1. Crohn disease with stricture. 2. Hypertension. 3. Gastroesophageal reflux disease. 4. Psoriasis.  POSTOPERATIVE DIAGNOSES: 1. Crohn disease with stricture. 2. Hypertension. 3. Gastroesophageal reflux disease. 4. Psoriasis.  CONSULTS:  None.  PROCEDURES:  September 15, 2010, diagnostic laparoscopy converted to open right hemicolectomy.  HISTORY AND HOSPITAL COURSE:  This is a 50 year old female with long history of Crohn disease I initially met in 2010 with a stricture of her terminal ileum unresponsive to medical therapy.  I then performed a laparoscopic ileocecectomy.  She did well after this until the last 6 or 7 months when she began developing more trouble.  She was maximized on her Crohn's medications, these were  not really helping.  She was noted to have some focal narrowing and beaking of her terminal ileum at its juncture with the cecum.  She and I then discussed either resecting this area or a possible stricture plasty.  She was taken to the operating room where her bowel was so very friable as well as the adhesions.  I ended up doing this and an open right colectomy due to the fact that this anastomosis needed to resected and there was likely the new anastomosis right at hepatic flexure, so I decided to perform an open right colectomy as this was likely the safest option.  She did well after this.  She was maintained on the floor.  Postoperatively she remained on her prednisone at that time.  She had some nausea initially. We awaited her ileus to resolve.  She was maintained on Entereg and subcutaneous heparin.  She began to have  bowel movements.  By postoperative day #3, her diet was advanced.  Her wound remained clean without any infection.  She was ambulating, her pain was controlled on oral medications.  On postoperative day #6, she was doing well and was discharged to home.  Her pertinent laboratory evaluation showed her pathology to be patchy mild active ileitis with some serosal fibrous adhesions and some colonic adenoma.  Other pertinent laboratory evaluation showed at discharge creatinine of 0.5 and at discharge hematocrit of 27.5.  PERTINENT RADIOLOGIC INFORMATION:  None.  DISCHARGE MEDICATIONS:  Promethazine, ProAir, Analpram,  vitamin B12, Flonase, Flexeril, Lunesta, omeprazole, Vicodin, amlodipine, benzonatate, Lomotil,  Symbicort, Diflucan, Bentyl, prednisone, azathioprine.  FOLLOWUP:  Her followup is with [facility].  We will call her next week for staple removal and I will plan on seeing her back on October 07, 2010.  She was discharged home with open abdominal surgery instruction sheet as well.     Dickey Gave, MDMCW/MEDQ  D:  09/21/2010  T:  09/21/2010  Job:  [id number]  cc:   Lowella Bandy. Olevia Perches, [place] [address]  Electronically Signed by Rolm Bookbinder MD on 09/21/2010 07:31:08 PM

## 2010-09-21 NOTE — Telephone Encounter (Signed)
Patient is currently in the hospital after surgery. Dr Olevia Perches called to say she would like the patient seen in the office in follow up 2 weeks from now. An appointment has been made for 10/04/10 @ 8:45 am. Dr Olevia Perches would also like her to resume Remicade around October 22, 2010 and every 8 weeks thereafter. I have spoken to Judson Roch at [facility] stay. She has scheduled patient to resume Remicade on 10/22/10 @ 2 pm. New orders have been sent to Short Stay.

## 2010-09-22 LAB — CLOSTRIDIUM DIFFICILE BY PCR: Toxigenic C. Difficile by PCR: NEGATIVE

## 2010-09-24 ENCOUNTER — Encounter (HOSPITAL_COMMUNITY): Payer: PRIVATE HEALTH INSURANCE

## 2010-09-29 ENCOUNTER — Other Ambulatory Visit (INDEPENDENT_AMBULATORY_CARE_PROVIDER_SITE_OTHER): Payer: Self-pay | Admitting: General Surgery

## 2010-09-29 DIAGNOSIS — R1011 Right upper quadrant pain: Secondary | ICD-10-CM

## 2010-09-29 NOTE — Telephone Encounter (Signed)
Called [facility] to give automatic RF on Norco 5-369m #[facility]Freida Busman

## 2010-09-30 ENCOUNTER — Encounter (INDEPENDENT_AMBULATORY_CARE_PROVIDER_SITE_OTHER): Payer: PRIVATE HEALTH INSURANCE

## 2010-09-30 NOTE — Progress Notes (Unsigned)
Subjective:     Patient ID: [name], female   DOB: 1960-04-29, 50 y.o.   MRN: [id number]  HPI   Review of Systems     Objective:   Physical Exam     Assessment:     ***    Plan:     ***     Subjective:    [name] is a 50 y.o. female who obtained a laceration {1-10:13787} {time; units:18646::"days"} ago, which required closure with {1-10:18281} {sutures/staples:11474}. Mechanism of injury: ***. She denies pain, redness, or drainage from the wound. Her last tetanus was {1-10:13787} {time; units:10300} ago.  {Common ambulatory SmartLinks:19316}  Review of Systems {ros; complete:30496}    Objective:    There were no vitals taken for this visit. Injury exam:  Post surgical wound is healing well, without evidence of infection.    Assessment:   Vertical abdominal incision site is healing well, without evidence of infection.    Plan:     1. {1-10:18281} {sutures/staples:11474} were removed. 2. Wound care discussed. 3. Follow up as needed.

## 2010-10-04 ENCOUNTER — Other Ambulatory Visit (INDEPENDENT_AMBULATORY_CARE_PROVIDER_SITE_OTHER): Payer: PRIVATE HEALTH INSURANCE

## 2010-10-04 ENCOUNTER — Encounter: Payer: Self-pay | Admitting: Internal Medicine

## 2010-10-04 ENCOUNTER — Telehealth: Payer: Self-pay

## 2010-10-04 ENCOUNTER — Ambulatory Visit (INDEPENDENT_AMBULATORY_CARE_PROVIDER_SITE_OTHER): Payer: PRIVATE HEALTH INSURANCE | Admitting: Internal Medicine

## 2010-10-04 DIAGNOSIS — E538 Deficiency of other specified B group vitamins: Secondary | ICD-10-CM

## 2010-10-04 DIAGNOSIS — K5010 Crohn's disease of large intestine without complications: Secondary | ICD-10-CM

## 2010-10-04 DIAGNOSIS — T8189XA Other complications of procedures, not elsewhere classified, initial encounter: Secondary | ICD-10-CM

## 2010-10-04 DIAGNOSIS — K56609 Unspecified intestinal obstruction, unspecified as to partial versus complete obstruction: Secondary | ICD-10-CM

## 2010-10-04 DIAGNOSIS — K5000 Crohn's disease of small intestine without complications: Secondary | ICD-10-CM

## 2010-10-04 LAB — COMPREHENSIVE METABOLIC PANEL
AST: 20 U/L (ref 0–37)
Alkaline Phosphatase: 64 U/L (ref 39–117)
BUN: 8 mg/dL (ref 6–23)
Calcium: 9.1 mg/dL (ref 8.4–10.5)
Creatinine, Ser: 0.6 mg/dL (ref 0.4–1.2)

## 2010-10-04 LAB — CBC WITH DIFFERENTIAL/PLATELET
Basophils Relative: 0.7 % (ref 0.0–3.0)
Eosinophils Absolute: 0 10*3/uL (ref 0.0–0.7)
Hemoglobin: 12.1 g/dL (ref 12.0–15.0)
MCHC: 33.6 g/dL (ref 30.0–36.0)
MCV: 95.1 fl (ref 78.0–100.0)
Monocytes Absolute: 0.8 10*3/uL (ref 0.1–1.0)
Neutro Abs: 5.8 10*3/uL (ref 1.4–7.7)
RBC: 3.78 Mil/uL — ABNORMAL LOW (ref 3.87–5.11)

## 2010-10-04 MED ORDER — CHOLESTYRAMINE 4 G PO PACK: PACK | ORAL | Status: DC

## 2010-10-04 MED ORDER — HYDROCODONE-ACETAMINOPHEN 10-500 MG PO TABS: 1.0000 | ORAL_TABLET | Freq: Four times a day (QID) | ORAL | Status: AC | PRN

## 2010-10-04 NOTE — Patient Instructions (Addendum)
We have sent the following medications to your pharmacy for you to pick up at your convenience: Vicodin 10/500. Take 1 tablet by mouth once daily as needed. Questran. Take 1 packet dissolved in water/juice and drink once daily. Take 2 hours away from all other medications. Your physician has requested that you go to the basement for the following lab work before leaving today: CBC, CMET Follow up in 4 weeks with Dr Olevia Perches. We have given you a b12 injection today. We have given you a PPD skin test today. Please come for your reading on 10/06/10 @ 3:00 pm. CC: Dr Donne Hazel

## 2010-10-04 NOTE — Progress Notes (Signed)
[name] 1960/05/03 MRN [id number]     History of Present Illness:  This is a 50 year old white female who is status post hospitalization for an anastomotic stricture at the ileocolic anastomosis. This was a second resection. Her first resection in August 2010 resulted in recurrence of Crohn's disease at the anastomosis. She will be restarting her Remicade on 09/21/10 and she has already restarted her Imuran 100 mg daily. She continues Prednisone 10 mg a day. Her surgery was 3 weeks ago. She is having 7 watery stools a day and a lot of abdominal tenderness. She denies fever. There is no rectal bleeding. She complains of rectal irritation due to diarrhea. Her weight has decreased from 219 pounds to 207 pounds. Her appointment with Dr Donne Hazel is Aug.3. Her diet has consisted of full liquids and low-residue foods.   Past Medical History  Diagnosis Date  . Crohn's disease   . Depression   . Personal history of other diseases of digestive system   . Hiatal hernia   . Adenomatous colon polyp   . GERD (gastroesophageal reflux disease)   . Vitamin B12 deficiency   . SBO (small bowel obstruction)     partial secondary to crohns stricture  . Psoriasis   . Gastritis    Past Surgical History  Procedure Date  . Partial hysterectomy   . Ileocecectomy 10/2008  . Carpal tunnel release     right  . Anal fissure repair   . Cholecystectomy   . Colon surgery     lap assisted ileocecectomy for crohns    reports that she quit smoking about 26 years ago. Her smoking use included Cigarettes. She has a 10 pack-year smoking history. She has never used smokeless tobacco. She reports that she does not drink alcohol or use illicit drugs. family history includes Breast cancer in an unspecified family member; Colon cancer in her mother; and Colon polyps in her brother, father, and mother. Allergies  Allergen Reactions  . Codeine Hives and Nausea And Vomiting        Review of Systems:  Positive for heartburn. Positive for abdominal pain diarrhea negative for fever  The remainder of the 10  point ROS is negative except as outlined in H&P   Physical Exam: General appearance  Well developed, in no distress, cushingoid. Eyes- non icteric. HEENT nontraumatic, normocephalic. Mouth no lesions, tongue papillated, no cheilosis. Neck supple without adenopathy, thyroid not enlarged, no carotid bruits, no JVD. Lungs Clear to auscultation bilaterally. Cor normal S1 normal S2, regular rapid  rhythm , no murmur,  quiet precordium. Abdomen normoactive bowel sounds. Very tender in all quadrants. Well-healed midline scar. No rebound. Rectal: Very tender anal canal and perianal area. Skin around the rectum appears normal. The rectal sphincter is very tender. Extremities no pedal edema. Skin no lesions. Neurological alert and oriented x 3. Psychological normal mood and affect.  Assessment and Plan:  Problem #1 Crohn's disease of the small bowel and colon, status post second resection of the anastomotic stricture. She is now 3 weeks post op and is still having a rough time. We will increase her pain medicine, add Questran 4 g a day for diarrhea, and have her continue Lomotil 4 tablets a day.  She is due today for a B12 shot and for TB skin test for Remicade. She will go ahead with her Remicade infusion on 09/21/2010. She will maintain the same prednisone dosage. We will check her chemistries and CBC today. She will need a follow  up office visit in 4 weeks.  10/04/2010 [name]

## 2010-10-04 NOTE — Telephone Encounter (Signed)
Pt aware. Rx sent to the pharmacy. Pt states she will recheck her K level in 2 weeks with Dr. Rocky Link.

## 2010-10-04 NOTE — Telephone Encounter (Signed)
Message copied by Faythe Casa on Mon Oct 04, 2010  3:18 PM ------      Message from: Lafayette Dragon      Created: Mon Oct 04, 2010  1:28 PM       Please call pt with low K+, please take K-Dur 20 mEq po bid  And have K+ rechecked in 2 weeks at Dr Lissa Merlin office or here.

## 2010-10-05 MED ORDER — POTASSIUM CHLORIDE CRYS ER 20 MEQ PO TBCR: 20.0000 meq | EXTENDED_RELEASE_TABLET | Freq: Two times a day (BID) | ORAL | Status: DC

## 2010-10-05 NOTE — Telephone Encounter (Signed)
Addended by: Algernon Huxley R on: 10/05/2010 09:47 AM   Modules accepted: Orders

## 2010-10-06 ENCOUNTER — Encounter: Payer: Self-pay | Admitting: Gastroenterology

## 2010-10-06 NOTE — Telephone Encounter (Signed)
error 

## 2010-10-08 ENCOUNTER — Encounter (INDEPENDENT_AMBULATORY_CARE_PROVIDER_SITE_OTHER): Payer: Self-pay | Admitting: General Surgery

## 2010-10-08 ENCOUNTER — Ambulatory Visit (INDEPENDENT_AMBULATORY_CARE_PROVIDER_SITE_OTHER): Payer: PRIVATE HEALTH INSURANCE | Admitting: General Surgery

## 2010-10-08 ENCOUNTER — Other Ambulatory Visit (INDEPENDENT_AMBULATORY_CARE_PROVIDER_SITE_OTHER): Payer: Self-pay | Admitting: General Surgery

## 2010-10-08 DIAGNOSIS — K5010 Crohn's disease of large intestine without complications: Secondary | ICD-10-CM

## 2010-10-08 DIAGNOSIS — K5000 Crohn's disease of small intestine without complications: Secondary | ICD-10-CM

## 2010-10-08 NOTE — Progress Notes (Signed)
Subjective:     Patient ID: [name], female   DOB: 1960/06/26, 50 y.o.   MRN: [id number]  HPI This is a 50 year old female with a long-standing history of Crohn's disease. I had previously done a laparoscopic-assisted ileocecectomy. This was complicated by stricture that appeared to be near her anastomosis. We returned to the operating room about 2-1/2 weeks ago now for an one ended up being an open right colectomy. She had some very significant adhesions and due to her body habitus as well as unable to complete this laparoscopically. She had a very diminutive portion of her right colon left and we ended up removing the stricture as well as some chronic Crohn's disease associated with that and doing a right colectomy. She has had some difficulty with numerous loose stools since then but is otherwise doing okay her appetite is slowly improving she does have some occasional nausea but has not been vomiting. She denies any fevers. She has some back pain but is otherwise doing well in terms of her abdominal pain. She has recently seen Dr. Olevia Perches and is being treated for her Crohn's disease as well.  Review of Systems     Objective:   Physical Exam    healing midline incision without infection, soft, nontender  Assessment:     Crohns disease s/p right colectomy for stricture    Plan:     I think she is doing very well after her surgery. I think most of this may be postobstructive diarrhea as well as in his as an exacerbation of her Crohn's disease. I think continuing to Dr. Olevia Perches to continue her Crohn's medications is the best plan. She can go back on any medication and Dr. Olevia Perches needs to put her on at this point. I don't think there is any role for investigating this any further it was no evidence of any leak or any infection as the cause of this. I am going to plan on seeing her back in 3 weeks just to make sure everything is slowly improving.

## 2010-10-11 ENCOUNTER — Other Ambulatory Visit: Payer: Self-pay | Admitting: *Deleted

## 2010-10-11 MED ORDER — PROMETHAZINE HCL 25 MG PO TABS: 25.0000 mg | ORAL_TABLET | Freq: Four times a day (QID) | ORAL | Status: DC | PRN

## 2010-10-14 ENCOUNTER — Other Ambulatory Visit: Payer: Self-pay | Admitting: *Deleted

## 2010-10-14 MED ORDER — CYCLOBENZAPRINE HCL 10 MG PO TABS: 10.0000 mg | ORAL_TABLET | Freq: Every evening | ORAL | Status: DC | PRN

## 2010-10-22 ENCOUNTER — Encounter (HOSPITAL_COMMUNITY): Payer: PRIVATE HEALTH INSURANCE | Attending: Internal Medicine

## 2010-10-22 ENCOUNTER — Telehealth: Payer: Self-pay | Admitting: *Deleted

## 2010-10-22 DIAGNOSIS — Z79899 Other long term (current) drug therapy: Secondary | ICD-10-CM | POA: Insufficient documentation

## 2010-10-22 DIAGNOSIS — Z9049 Acquired absence of other specified parts of digestive tract: Secondary | ICD-10-CM | POA: Insufficient documentation

## 2010-10-22 DIAGNOSIS — K5090 Crohn's disease, unspecified, without complications: Secondary | ICD-10-CM | POA: Insufficient documentation

## 2010-10-22 NOTE — Telephone Encounter (Signed)
Spoke with Dr. York Cerise office and patient has not gotten labs there. Left a message for patient to call me re: lab work

## 2010-10-22 NOTE — Telephone Encounter (Signed)
Message copied by Hulan Saas on Fri Oct 22, 2010  2:20 PM ------      Message from: HUNT, [place]      Created: Mon Oct 04, 2010  3:22 PM      Regarding: labs       Supposed to have repeat potassium check at Dr. Lissa Merlin office around August 13th

## 2010-10-25 NOTE — Telephone Encounter (Signed)
Patient calling to get rx faxed to her PCP for labs. She will get them drawn this week.

## 2010-10-25 NOTE — Telephone Encounter (Signed)
Left patient a message to call me at her cell number.

## 2010-10-26 ENCOUNTER — Other Ambulatory Visit: Payer: Self-pay | Admitting: *Deleted

## 2010-10-26 MED ORDER — PREDNISONE 10 MG PO TABS: ORAL_TABLET | ORAL | Status: DC

## 2010-10-26 MED ORDER — FLUCONAZOLE 100 MG PO TABS: ORAL_TABLET | ORAL | Status: DC

## 2010-10-26 NOTE — Telephone Encounter (Signed)
Dr Olevia Perches, patient requests refills on fluconazole. We gave her #12 on 07/27/10 for her to take 1 time per week x 12 weeks as needed. Do you want me to give refills?

## 2010-10-26 NOTE — Telephone Encounter (Signed)
Yes, give refills, please.

## 2010-10-28 ENCOUNTER — Other Ambulatory Visit (INDEPENDENT_AMBULATORY_CARE_PROVIDER_SITE_OTHER): Payer: Self-pay | Admitting: General Surgery

## 2010-10-28 ENCOUNTER — Encounter: Payer: Self-pay | Admitting: Internal Medicine

## 2010-10-29 ENCOUNTER — Telehealth: Payer: Self-pay | Admitting: Internal Medicine

## 2010-10-29 MED ORDER — PROMETHAZINE HCL 25 MG PO TABS: 25.0000 mg | ORAL_TABLET | Freq: Four times a day (QID) | ORAL | Status: DC | PRN

## 2010-10-29 NOTE — Telephone Encounter (Signed)
Received a call from patient that for the last 2 days, she has had nausea and diarrhea. States she has had 11 diarrhea stools today. Only has bleeding when she wipes from irritation. She is taking Zofran but it is not helping. She is out of Phenergan. Denies vomiting. T 99.9, Drinking sips of Sprite and tea. Spoke with Dr. Olevia Perches, patient to increase Prednisone to 30 mg/day. May refill Phenergan. Spoke with patient and she will increase her Prednisone. Rx sent to pharmacy for Phenergan.

## 2010-11-01 ENCOUNTER — Encounter (INDEPENDENT_AMBULATORY_CARE_PROVIDER_SITE_OTHER): Payer: Self-pay | Admitting: General Surgery

## 2010-11-01 ENCOUNTER — Encounter (INDEPENDENT_AMBULATORY_CARE_PROVIDER_SITE_OTHER): Payer: PRIVATE HEALTH INSURANCE | Admitting: General Surgery

## 2010-11-01 ENCOUNTER — Ambulatory Visit (INDEPENDENT_AMBULATORY_CARE_PROVIDER_SITE_OTHER): Payer: PRIVATE HEALTH INSURANCE | Admitting: General Surgery

## 2010-11-01 VITALS — BP 128/82 | HR 60 | Temp 96.8°F

## 2010-11-01 DIAGNOSIS — Z09 Encounter for follow-up examination after completed treatment for conditions other than malignant neoplasm: Secondary | ICD-10-CM

## 2010-11-01 NOTE — Progress Notes (Signed)
Subjective:     Patient ID: [name], female   DOB: 31-Aug-1960, 50 y.o.   MRN: [id number]  HPI This is a 50 year old female white female with a long history of Crohn's disease. I recently took her to the operating room and end up doing a right hemicolectomy for a stricture either at her prior anastomosis from a laparoscopic ileocecectomy or a  new Crohn's stricture. There was no way to do a stricturoplasty on this area at that point. There was such a small portion of her right colon left after this so I chose to remove the right side of her colon for her anastomosis. She has had some significant difficulty with the copious diarrhea postoperatively. She is seeing Dr. Olevia Perches in she's having her Crohn's manage by Dr. Olevia Perches. She has some occasional nausea but no emesis. Her appetite is slowly improving. She has some left-sided abdominal pain that is slowly improving also. I think this is mostly just postoperative in nature.  Review of Systems     Objective:   Physical Exam healed incision, no infection, mild left sided abdominal pain, no hernia   Assessment:     S/p right colectomy Crohns disease    Plan:        I think most of her symptoms are just normal postoperative in nature as well as secondary to her Crohn's disease. She is being followed by Dr. Olevia Perches and is having her medications adjusted at this point. I am not going to set up a followup for at this point. If she still continues to have a left-sided abdominal pain I asked her to call me back in a few weeks.

## 2010-11-01 NOTE — Patient Instructions (Signed)
May try and use advil, alleve or other anti-inflammatory for pain. If continue to have pain to same degree over next 3-4 weeks, call me.

## 2010-11-02 ENCOUNTER — Telehealth: Payer: Self-pay | Admitting: *Deleted

## 2010-11-02 DIAGNOSIS — E876 Hypokalemia: Secondary | ICD-10-CM

## 2010-11-02 NOTE — Telephone Encounter (Signed)
Dr. Olevia Perches, Patient had labs done in our office on 10/04/10 and Potassium was 3.3(L). She took K DUR 20 MeQ BID for 2 weeks and was to have a recheck. She has just had the recheck (in [place]) and it is still 3.3. Do we need to do anything?

## 2010-11-02 NOTE — Telephone Encounter (Signed)
PLEASE CALL PT TO CONTINUE K DUR AT THE SAME DOSE AND RECHECK IN 2 WEEKS

## 2010-11-03 ENCOUNTER — Telehealth (INDEPENDENT_AMBULATORY_CARE_PROVIDER_SITE_OTHER): Payer: Self-pay | Admitting: General Surgery

## 2010-11-03 MED ORDER — POTASSIUM CHLORIDE CRYS ER 20 MEQ PO TBCR: 20.0000 meq | EXTENDED_RELEASE_TABLET | Freq: Two times a day (BID) | ORAL | Status: DC

## 2010-11-03 NOTE — Telephone Encounter (Signed)
PT CALLED TO LET DR. Donne Hazel KNOW THAT SHE IS STILL EXPERIENCING LEFT SIDE ABDOMINAL PAIN. NOT BETTER SINCE SEEN ON Monday. NO FEVER OR NAUSEA.  I TOLD HER I WILL CONTACT DR. Donne Hazel IN AM RE ADVICE.

## 2010-11-03 NOTE — Telephone Encounter (Signed)
Spoke with patient and gave her Dr. Nichola Sizer recommendations. Labs in [facility] for 11/17/10.

## 2010-11-03 NOTE — Telephone Encounter (Signed)
Rx sent to pharmacy for K Dur. Left a message for patient to call me back.

## 2010-11-10 ENCOUNTER — Encounter: Payer: Self-pay | Admitting: Internal Medicine

## 2010-11-10 ENCOUNTER — Ambulatory Visit (INDEPENDENT_AMBULATORY_CARE_PROVIDER_SITE_OTHER): Payer: PRIVATE HEALTH INSURANCE | Admitting: Internal Medicine

## 2010-11-10 ENCOUNTER — Other Ambulatory Visit: Payer: Self-pay | Admitting: *Deleted

## 2010-11-10 ENCOUNTER — Other Ambulatory Visit (INDEPENDENT_AMBULATORY_CARE_PROVIDER_SITE_OTHER): Payer: PRIVATE HEALTH INSURANCE

## 2010-11-10 VITALS — BP 126/84 | HR 88 | Ht 68.0 in | Wt 214.2 lb

## 2010-11-10 DIAGNOSIS — K648 Other hemorrhoids: Secondary | ICD-10-CM

## 2010-11-10 DIAGNOSIS — T8189XA Other complications of procedures, not elsewhere classified, initial encounter: Secondary | ICD-10-CM

## 2010-11-10 DIAGNOSIS — K5090 Crohn's disease, unspecified, without complications: Secondary | ICD-10-CM

## 2010-11-10 DIAGNOSIS — K5000 Crohn's disease of small intestine without complications: Secondary | ICD-10-CM

## 2010-11-10 DIAGNOSIS — R1033 Periumbilical pain: Secondary | ICD-10-CM

## 2010-11-10 DIAGNOSIS — K56609 Unspecified intestinal obstruction, unspecified as to partial versus complete obstruction: Secondary | ICD-10-CM

## 2010-11-10 DIAGNOSIS — E538 Deficiency of other specified B group vitamins: Secondary | ICD-10-CM

## 2010-11-10 MED ORDER — HYDROCODONE-ACETAMINOPHEN 5-325 MG PO TABS: 1.0000 | ORAL_TABLET | Freq: Four times a day (QID) | ORAL | Status: AC | PRN

## 2010-11-10 MED ORDER — HYDROCORTISONE ACE-PRAMOXINE 2.5-1 % RE CREA: TOPICAL_CREAM | Freq: Two times a day (BID) | RECTAL | Status: DC | PRN

## 2010-11-10 MED ORDER — MESALAMINE 1000 MG RE SUPP: 1000.0000 mg | Freq: Two times a day (BID) | RECTAL | Status: DC

## 2010-11-10 MED ORDER — FLUCONAZOLE 100 MG PO TABS: ORAL_TABLET | ORAL | Status: DC

## 2010-11-10 NOTE — Progress Notes (Signed)
[name] 1960/03/17 MRN [id number]        History of Present Illness:  This is a 50 year old white female with Crohn's disease of the terminal ileum. Status post recent terminal ileal resection in July 2012 for a chronic stricture. She has been slightly improved in terms of abdominal pain and diarrhea. Her weight has increased from 207 lbs to 214 pounds on prednisone 30 mg a day. She is on Imuran 100 mg daily and Remicade infusions every 8 weeks. Her diarrhea has decreased from 7-10 bowel movements a day to 5 bowel movements a day. She is not taking any pain medications other than Advil and Tylenol. She is rectal bleeding once a week after having several diarrheal bowel movements. She complains of rectal soreness and pain    Past Medical History  Diagnosis Date  . Crohn's disease   . Depression   . Personal history of other diseases of digestive system   . Hiatal hernia   . Adenomatous colon polyp   . GERD (gastroesophageal reflux disease)   . Vitamin B12 deficiency   . SBO (small bowel obstruction)     partial secondary to crohns stricture  . Psoriasis   . Gastritis    Past Surgical History  Procedure Date  . Partial hysterectomy   . Ileocecectomy 10/2008  . Carpal tunnel release     right  . Anal fissure repair   . Cholecystectomy   . Colon surgery     lap assisted ileocecectomy for crohns  . Colon surgery     right hemicolectomy for stricture    reports that she quit smoking about 26 years ago. Her smoking use included Cigarettes. She has a 10 pack-year smoking history. She has never used smokeless tobacco. She reports that she does not drink alcohol or use illicit drugs. family history includes Breast cancer in an unspecified family member; Colon cancer in her mother; and Colon polyps in her brother, father, and mother. Allergies  Allergen Reactions  . Codeine Hives and Nausea And Vomiting        Review of Systems: intermittent thrush and cheilosis,  relieved with Diflucan  The remainder of the 10  point ROS is negative except as outlined in H&P   Physical Exam: General appearance  Well developed, in no distress, cushingoid-appearing.  Eyes- non icteric. HEENT nontraumatic, normocephalic. Mouth no lesions, tongue papillated, healing cheilosis. Neck supple without adenopathy, thyroid not enlarged, no carotid bruits, no JVD. Lungs Clear to auscultation bilaterally. Cor normal S1 normal S2, regular rhythm , no murmur,  quiet precordium. Abdomen  protuberant and very tender diffusely mostly in the left middle quadrant. Normal active bowel sounds. Well-healed surgical scar. No mass or ascites. Rectal and anoscopic exam reveals a very tender anal canal with erythematous and edematous mucosa of the rectal ampulla. Small hemorrhoids which bleed on contact with the anoscope. Extremities no pedal edema. Skin no lesions. Neurological alert and oriented x 3. Psychological normal mood and affect.  Assessment and Plan:  Problem #1 Patient is status post a surgical resection of a terminal ileum stricture. She is now 7 weeks postop. She is still having diarrhea and abdominal pain. She has improved on prednisone 30 mg daily. It is not clear whether there is still anactive Crohn's disease in the proximal small bowel which keeps her from getting better postoperatively. We will obtain a small bowel capsule endoscopy to look for active Crohn's disease. Prior attempts for small bowel capsule endoscopy were unsuccessful due to  terminal ileal obstruction. At the same time, she will continue on all her medications. She will reduce her prednisone to 25 mg a day for 2 weeks then 20 mg a day. We need to recheck her potassium. We will refill her Diflucan, hydrocodone and Analpram cream. She will get a B12 shot today. We will also start her on Canasa suppositories 1,000 mg each bedtime.   11/10/2010 [name]

## 2010-11-10 NOTE — Patient Instructions (Signed)
We have sent the following medications to your pharmacy for you to pick up at your convenience: Analpram 2.5 % Canasa Suppositories Diflucan You have been scheduled for a capsule endoscopy. We have given you a b12 injection. Please schedule your next injection for 1 month from now.

## 2010-11-11 ENCOUNTER — Encounter: Payer: Self-pay | Admitting: Internal Medicine

## 2010-11-11 ENCOUNTER — Telehealth: Payer: Self-pay | Admitting: *Deleted

## 2010-11-11 NOTE — Telephone Encounter (Signed)
Message copied by Hulan Saas on Thu Nov 11, 2010  2:19 PM ------      Message from: [place] Dragon      Created: Thu Nov 11, 2010 12:47 PM       Please call pt with normal K+, please continue the same dose till I see her in the office

## 2010-11-11 NOTE — Telephone Encounter (Signed)
Patient notified of results and Dr. Nichola Sizer recommendation.

## 2010-11-17 ENCOUNTER — Ambulatory Visit (INDEPENDENT_AMBULATORY_CARE_PROVIDER_SITE_OTHER): Payer: PRIVATE HEALTH INSURANCE | Admitting: Internal Medicine

## 2010-11-17 DIAGNOSIS — K5000 Crohn's disease of small intestine without complications: Secondary | ICD-10-CM

## 2010-11-17 NOTE — Progress Notes (Signed)
SPatient here today for capsule endoscopy.  Patient tolerated well.  No problems or complaints Lot # [id number] Exp 02/2012  She scanned report for results.

## 2010-11-23 ENCOUNTER — Telehealth: Payer: Self-pay | Admitting: *Deleted

## 2010-11-23 NOTE — Telephone Encounter (Signed)
Spoke with patient as per Nicoletta Ba, PA and she has passed the capsule.

## 2010-12-01 ENCOUNTER — Telehealth (INDEPENDENT_AMBULATORY_CARE_PROVIDER_SITE_OTHER): Payer: Self-pay | Admitting: General Surgery

## 2010-12-01 ENCOUNTER — Encounter: Payer: Self-pay | Admitting: Internal Medicine

## 2010-12-03 LAB — DIFFERENTIAL
Basophils Absolute: 0.1
Eosinophils Relative: 0
Lymphocytes Relative: 12
Lymphs Abs: 1.6
Monocytes Absolute: 0.4
Monocytes Relative: 3
Neutro Abs: 11.9 — ABNORMAL HIGH

## 2010-12-03 LAB — COMPREHENSIVE METABOLIC PANEL
AST: 19
Albumin: 4
BUN: 11
Calcium: 9.3
Creatinine, Ser: 0.68
GFR calc Af Amer: >60
Total Protein: 7.6

## 2010-12-03 LAB — BASIC METABOLIC PANEL
BUN: 8
CO2: 25
Calcium: 8.3 — ABNORMAL LOW
Chloride: 109
Creatinine, Ser: 0.67
Creatinine, Ser: 0.68
GFR calc Af Amer: >60
Glucose, Bld: 104 — ABNORMAL HIGH
Potassium: 3.4 — ABNORMAL LOW
Sodium: 139

## 2010-12-03 LAB — CBC
HCT: 36.4
HCT: 42.8
MCHC: 34
MCV: 91.7
MCV: 92.9
Platelets: 171
Platelets: 171
Platelets: 230
RBC: 3.67 — ABNORMAL LOW
RDW: 12.9
WBC: 14.1 — ABNORMAL HIGH
WBC: 5.8
WBC: 6.1
WBC: 8.2

## 2010-12-03 LAB — CLOSTRIDIUM DIFFICILE EIA
C difficile Toxins A+B, EIA: NEGATIVE
C difficile Toxins A+B, EIA: NEGATIVE

## 2010-12-03 LAB — CULTURE, BLOOD (ROUTINE X 2): Culture: NO GROWTH

## 2010-12-03 LAB — STOOL CULTURE

## 2010-12-03 LAB — URINE CULTURE: Colony Count: 70000

## 2010-12-06 ENCOUNTER — Other Ambulatory Visit: Payer: Self-pay | Admitting: *Deleted

## 2010-12-06 MED ORDER — PROMETHAZINE HCL 25 MG PO TABS: 25.0000 mg | ORAL_TABLET | Freq: Four times a day (QID) | ORAL | Status: DC | PRN

## 2010-12-06 MED ORDER — HYDROCODONE-ACETAMINOPHEN 5-325 MG PO TABS: 1.0000 | ORAL_TABLET | Freq: Four times a day (QID) | ORAL | Status: AC | PRN

## 2010-12-08 ENCOUNTER — Telehealth (INDEPENDENT_AMBULATORY_CARE_PROVIDER_SITE_OTHER): Payer: Self-pay

## 2010-12-08 NOTE — Telephone Encounter (Signed)
LMOM returning pt's voicmail. I made an appt for the pt b/c she continues to have left side abdominal pain. I left the appt on her answering machine at home.[name]

## 2010-12-10 ENCOUNTER — Other Ambulatory Visit: Payer: Self-pay | Admitting: *Deleted

## 2010-12-10 MED ORDER — PREDNISONE 10 MG PO TABS: ORAL_TABLET | ORAL | Status: DC

## 2010-12-10 MED ORDER — CYCLOBENZAPRINE HCL 10 MG PO TABS: 10.0000 mg | ORAL_TABLET | Freq: Every evening | ORAL | Status: DC | PRN

## 2010-12-13 ENCOUNTER — Ambulatory Visit (INDEPENDENT_AMBULATORY_CARE_PROVIDER_SITE_OTHER): Payer: PRIVATE HEALTH INSURANCE | Admitting: General Surgery

## 2010-12-13 ENCOUNTER — Encounter (INDEPENDENT_AMBULATORY_CARE_PROVIDER_SITE_OTHER): Payer: Self-pay | Admitting: General Surgery

## 2010-12-13 VITALS — BP 148/96 | HR 64 | Temp 97.4°F | Resp 20 | Ht 68.0 in | Wt 223.1 lb

## 2010-12-13 DIAGNOSIS — K5090 Crohn's disease, unspecified, without complications: Secondary | ICD-10-CM

## 2010-12-13 NOTE — Progress Notes (Signed)
Addended byMargarette Asal on: 12/13/2010 11:25 AM   Modules accepted: Orders

## 2010-12-13 NOTE — Progress Notes (Signed)
Subjective:     Patient ID: [name], female   DOB: 22-Mar-1960, 50 y.o.   MRN: [id number]  HPI This is a 50 year old female with history of Crohn's disease. I know her well from ileocecectomy which was then followed by a right colectomy due to a stricture that she developed. She has done fairly well after this operation. She still being treated by Dr. Olevia Perches. She continues to have about 6-8 bowel movements per day. She is eating now with really no nausea or vomiting. She does however have some persistent left lower quadrant pain that is present most of the time that is not going away. She has no other complaints referable to this except for pain.  Review of Systems     Objective:   Physical Exam Soft, mildly tender left lower quadrant, incision healed well, cannot identify a hernia    Assessment:     Crohns disease LLQ pain s/p right colectomy    Plan:     I don't know why this pain persists.  I think it is reasonable now to get a ct scan to evaluate to ensure no hernia or other source that would need to be addressed surgically.  I will call her with results.

## 2010-12-13 NOTE — Patient Instructions (Signed)
I will call you with CT scan results.

## 2010-12-16 ENCOUNTER — Ambulatory Visit (HOSPITAL_COMMUNITY)
Admission: RE | Admit: 2010-12-16 | Discharge: 2010-12-16 | Disposition: A | Discharge status: Home / Self Care | Payer: PRIVATE HEALTH INSURANCE | Source: Ambulatory Visit | Attending: General Surgery | Admitting: General Surgery

## 2010-12-16 DIAGNOSIS — M87059 Idiopathic aseptic necrosis of unspecified femur: Secondary | ICD-10-CM | POA: Insufficient documentation

## 2010-12-16 DIAGNOSIS — K5090 Crohn's disease, unspecified, without complications: Secondary | ICD-10-CM

## 2010-12-16 DIAGNOSIS — I700 Atherosclerosis of aorta: Secondary | ICD-10-CM | POA: Insufficient documentation

## 2010-12-16 DIAGNOSIS — R160 Hepatomegaly, not elsewhere classified: Secondary | ICD-10-CM | POA: Insufficient documentation

## 2010-12-16 DIAGNOSIS — R1032 Left lower quadrant pain: Secondary | ICD-10-CM | POA: Insufficient documentation

## 2010-12-16 MED ORDER — IOHEXOL 300 MG/ML  SOLN: 100.0000 mL | Freq: Once | INTRAMUSCULAR | Status: AC | PRN

## 2010-12-17 ENCOUNTER — Encounter (HOSPITAL_COMMUNITY): Payer: PRIVATE HEALTH INSURANCE | Attending: Internal Medicine

## 2010-12-17 DIAGNOSIS — K5090 Crohn's disease, unspecified, without complications: Secondary | ICD-10-CM | POA: Insufficient documentation

## 2010-12-17 DIAGNOSIS — Z79899 Other long term (current) drug therapy: Secondary | ICD-10-CM | POA: Insufficient documentation

## 2010-12-17 DIAGNOSIS — Z9049 Acquired absence of other specified parts of digestive tract: Secondary | ICD-10-CM | POA: Insufficient documentation

## 2010-12-22 ENCOUNTER — Encounter: Payer: Self-pay | Admitting: Internal Medicine

## 2010-12-22 ENCOUNTER — Telehealth (INDEPENDENT_AMBULATORY_CARE_PROVIDER_SITE_OTHER): Payer: Self-pay

## 2010-12-22 NOTE — Telephone Encounter (Signed)
Called pt to notify her of her CT results per Dr Donne Hazel. The CT doesn't really explain her pain or show any hernia's per Dr Donne Hazel. I advised the pt if her pain continues to call us back to make another appt with Dr Donne Hazel.Freida Busman

## 2010-12-23 ENCOUNTER — Other Ambulatory Visit: Payer: Self-pay | Admitting: *Deleted

## 2010-12-23 MED ORDER — DICYCLOMINE HCL 20 MG PO TABS: 20.0000 mg | ORAL_TABLET | Freq: Two times a day (BID) | ORAL | Status: DC

## 2010-12-28 ENCOUNTER — Telehealth: Payer: Self-pay | Admitting: Internal Medicine

## 2010-12-28 NOTE — Telephone Encounter (Signed)
Patient calling to report increased pain in left side x 8 days. She saw Dr. Donne Hazel on 12/13/10 and had a CT scan which was negative. She states her pain is stabbing like in the left abdomen at her belly button. She is having watery, diarrhea and reports she had 11 stools yesterday. She does see blood on the tissue when wipes but none in stool.  Has some nausea and temp today is 99.6. Denies vomiting, use of antibiotics or being around anyone sick. She is on Prednisone 53m/daily. She is using Bentyl and Lomotil. Last Remicade was 2 weeks ago. She is out of her pain medication(Norco). Please, advise.

## 2010-12-29 ENCOUNTER — Telehealth: Payer: Self-pay | Admitting: Internal Medicine

## 2010-12-29 MED ORDER — DICYCLOMINE HCL 20 MG PO TABS: ORAL_TABLET | ORAL | Status: DC

## 2010-12-29 MED ORDER — HYDROCODONE-ACETAMINOPHEN 5-325 MG PO TABS: 1.0000 | ORAL_TABLET | Freq: Four times a day (QID) | ORAL | Status: AC | PRN

## 2010-12-29 MED ORDER — METRONIDAZOLE 250 MG PO TABS: 250.0000 mg | ORAL_TABLET | Freq: Three times a day (TID) | ORAL | Status: AC

## 2010-12-29 NOTE — Telephone Encounter (Signed)
Spoke with Dr. Olevia Perches. Per Dr. Olevia Perches refill Norco, Flagyl 250 mg po TID , Bentyl 20 mg TID. Patient aware and rx sent to pharmacy

## 2010-12-29 NOTE — Telephone Encounter (Signed)
See phone note on 12/28/10

## 2010-12-30 ENCOUNTER — Telehealth: Payer: Self-pay | Admitting: *Deleted

## 2010-12-30 ENCOUNTER — Telehealth: Payer: Self-pay | Admitting: Internal Medicine

## 2010-12-30 NOTE — Telephone Encounter (Signed)
Opened in error

## 2010-12-30 NOTE — Telephone Encounter (Signed)
Rx has been faxed. Left a message for patient.

## 2011-01-11 ENCOUNTER — Encounter: Payer: Self-pay | Admitting: Internal Medicine

## 2011-01-11 ENCOUNTER — Other Ambulatory Visit (INDEPENDENT_AMBULATORY_CARE_PROVIDER_SITE_OTHER): Payer: PRIVATE HEALTH INSURANCE

## 2011-01-11 ENCOUNTER — Ambulatory Visit (INDEPENDENT_AMBULATORY_CARE_PROVIDER_SITE_OTHER): Payer: PRIVATE HEALTH INSURANCE | Admitting: Internal Medicine

## 2011-01-11 ENCOUNTER — Telehealth: Payer: Self-pay | Admitting: *Deleted

## 2011-01-11 DIAGNOSIS — D849 Immunodeficiency, unspecified: Secondary | ICD-10-CM

## 2011-01-11 DIAGNOSIS — K5090 Crohn's disease, unspecified, without complications: Secondary | ICD-10-CM

## 2011-01-11 DIAGNOSIS — E876 Hypokalemia: Secondary | ICD-10-CM

## 2011-01-11 DIAGNOSIS — T8189XA Other complications of procedures, not elsewhere classified, initial encounter: Secondary | ICD-10-CM

## 2011-01-11 DIAGNOSIS — K5000 Crohn's disease of small intestine without complications: Secondary | ICD-10-CM

## 2011-01-11 DIAGNOSIS — R1033 Periumbilical pain: Secondary | ICD-10-CM

## 2011-01-11 DIAGNOSIS — K648 Other hemorrhoids: Secondary | ICD-10-CM

## 2011-01-11 LAB — COMPREHENSIVE METABOLIC PANEL
ALT: 13 U/L (ref 0–35)
AST: 14 U/L (ref 0–37)
CO2: 29 mEq/L (ref 19–32)
Chloride: 105 mEq/L (ref 96–112)
Creatinine, Ser: 0.7 mg/dL (ref 0.4–1.2)
GFR: 89.15 mL/min (ref >60.00)
Sodium: 143 mEq/L (ref 135–145)
Total Bilirubin: 0.6 mg/dL (ref 0.3–1.2)
Total Protein: 7.2 g/dL (ref 6.0–8.3)

## 2011-01-11 MED ORDER — HYDROCORTISONE ACE-PRAMOXINE 2.5-1 % RE CREA: TOPICAL_CREAM | Freq: Two times a day (BID) | RECTAL | Status: DC | PRN

## 2011-01-11 MED ORDER — PEG-KCL-NACL-NASULF-NA ASC-C 100 G PO SOLR: 1.0000 | Freq: Once | ORAL | Status: DC

## 2011-01-11 MED ORDER — SODIUM CHLORIDE 0.9 % IV SOLN: 10.0000 mg/kg | INTRAVENOUS | Status: DC

## 2011-01-11 MED ORDER — DIPHENHYDRAMINE HCL 25 MG PO CAPS: 50.0000 mg | ORAL_CAPSULE | ORAL | Status: DC

## 2011-01-11 MED ORDER — DICYCLOMINE HCL 20 MG PO TABS: ORAL_TABLET | ORAL | Status: DC

## 2011-01-11 MED ORDER — ACETAMINOPHEN ER 650 MG PO TBCR: 650.0000 mg | EXTENDED_RELEASE_TABLET | ORAL | Status: DC

## 2011-01-11 MED ORDER — CYANOCOBALAMIN 1000 MCG/ML IJ SOLN
1000.0000 ug | INTRAMUSCULAR | Status: AC
  Administered 2011-01-11 – 2011-04-18 (×4): 1000 ug via INTRAMUSCULAR

## 2011-01-11 MED ORDER — HYDROCODONE-ACETAMINOPHEN 10-325 MG PO TABS: 1.0000 | ORAL_TABLET | Freq: Four times a day (QID) | ORAL | Status: AC | PRN

## 2011-01-11 MED ORDER — AZATHIOPRINE 50 MG PO TABS: ORAL_TABLET | ORAL | Status: DC

## 2011-01-11 MED ORDER — DIPHENOXYLATE-ATROPINE 2.5-0.025 MG PO TABS: 1.0000 | ORAL_TABLET | Freq: Three times a day (TID) | ORAL | Status: DC

## 2011-01-11 MED ORDER — FLUCONAZOLE 100 MG PO TABS: ORAL_TABLET | ORAL | Status: DC

## 2011-01-11 MED ORDER — POTASSIUM CHLORIDE CRYS ER 20 MEQ PO TBCR: EXTENDED_RELEASE_TABLET | ORAL | Status: DC

## 2011-01-11 MED ORDER — MESALAMINE ER 500 MG PO CPCR: ORAL_CAPSULE | ORAL | Status: DC

## 2011-01-11 NOTE — Patient Instructions (Addendum)
You have been scheduled for a colonoscopy. Please follow written instructions given to you at your visit today.  Please pick up your prep kit at the pharmacy within the next 2-3 days. We have sent the following medications to your pharmacy for you to pick up at your convenience: Imuran Diflucan Bentyl Lomotil Analpram Hydrocodone Pentasa 2 tablets twice daily Your physician has requested that you go to the basement for the following lab work before leaving today: CMET We have changed your Remicade to 10 mg every 6 weeks. We have given you your B12 injection today.

## 2011-01-11 NOTE — Progress Notes (Signed)
[name] 06/23/1960 MRN [id number]    History of Present Illness:  This is a 50 year old white female with Crohn's disease of the terminal ileum diagnosed in August 2009. She is status post terminal ileal resection in August 2010 and again in July 2012. A recent small bowel capsule endoscopy in September 2012 was incomplete as the capsule did not reach cecum in 8 hours. There was poor visualization after 5 hours. There were subtle inflammatory changes of the mucosa of the distal small bowel. His last colonoscopy in March 2011 and prior to that in 2009 showed inflamed mucosa at the ileocecal anastomosis. She had a tubular adenoma. The terminal ileum appeared normal. She is not doing well on maximum medical therapy which has included Imuran 100 mg a day, Remicade 10 mg/kg every 8 weeks, Bentyl and Lomotil and prednisone 20 mg a day. She just has completed a course of Flagyl without much improvement. She is having watery diarrhea and moderately severe constant left lower and left middle quadrant abdominal pain which extends to the epigastrium. She has been running a low-grade temperature of 99.6. Her weight has been stable. 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 It also showed hepatomegaly. There is no evidence of active Crohn's disease.   Past Medical History  Diagnosis Date  . Crohn's disease   . Depression   . Personal history of other diseases of digestive system   . Hiatal hernia   . Adenomatous colon polyp   . GERD (gastroesophageal reflux disease)   . Vitamin B12 deficiency   . SBO (small bowel obstruction)     partial secondary to crohns stricture  . Psoriasis   . Gastritis   . Abdominal pain    Past Surgical History  Procedure Date  . Partial hysterectomy   . Ileocecectomy 10/2008  . Carpal tunnel release     right  . Anal fissure repair   . Cholecystectomy   . Colon surgery     lap assisted  ileocecectomy for crohns  . Colon surgery     right hemicolectomy for stricture    reports that she quit smoking about 26 years ago. Her smoking use included Cigarettes. She has a 10 pack-year smoking history. She has never used smokeless tobacco. She reports that she does not drink alcohol or use illicit drugs. family history includes Breast cancer in an unspecified family member; Colon cancer in her mother; and Colon polyps in her brother, father, and mother. Allergies  Allergen Reactions  . Codeine Hives and Nausea And Vomiting        Review of Systems: Denies dysphagia, odynophagia, shortness of breath  The remainder of the 10 point ROS is negative except as outlined in H&P   Physical Exam: General appearance  Well developed, in no distress., Cushingoid-appearing Eyes- non icteric. HEENT nontraumatic, normocephalic. Mouth no lesions, tongue papillated,  cheilosis. Present Neck supple without adenopathy, thyroid not enlarged, no carotid bruits, no JVD. Lungs Clear to auscultation bilaterally. Cor normal S1, normal S2, regular rhythm, no murmur,  quiet precordium. Abdomen: Soft abdomen but decreased bowel sounds. Marked tenderness diffusely more so in the left lower middle and upper quadrant, no rebound, no palpable mass. Rectal: Hemoccult negative stool. Extremities no pedal edema. Skin no lesions. Neurological alert and oriented x 3. Psychological normal mood and affect.  Assessment and Plan:  Problem #1 Ongoing abdominal pain in a patient with Crohn's disease of the  small bowel for which she is status post a second resection of the terminal ileum. She is now showing a recurrence of the disease in the distal small bowel as per capsule endoscopy. In addition, she has avascular necrosis of the hip and therefore we will have to start tapering off her prednisone as fast as we can. The options for her treatment are limited. We will change the Remicade infusion to every 6 week  intervals. We will add Pentasa 500 mg 2 twice a day, increase her pain medications and proceed with a colonoscopy to rule out colitis. Since the distribution of the pain is in the area of the left colon area, we will be checking her metabolic panel. She will start a prednisone taper alternating between 50 mg of prednisone for 2 weeks before she goes down to 50 mg a day and then alternate 15 and 10 mg for 2 weeks before she decreases to 10 mg a day. I will see her again in 6 weeks.   01/11/2011 [name]

## 2011-01-11 NOTE — Telephone Encounter (Signed)
Message copied by Larina Bras on Tue Jan 11, 2011  3:13 PM ------      Message from: [place] Dragon      Created: Tue Jan 11, 2011  1:14 PM       Please call pt with low K+ 3.1, please increase K+ to 1 po bid x 5 days. Recheck K+ in 1 week

## 2011-01-11 NOTE — Telephone Encounter (Signed)
Per Dr Nichola Sizer verbal orders, since patient is not currently on potassium as previously thought, she may take K Dur 20 mEq once daily x 5 days then she can have repeat labs. Patient will come for labs next week and will pick up rx today.

## 2011-01-11 NOTE — Telephone Encounter (Signed)
Dr Olevia Perches, patient is not currently on potassium. What dosage would you like her to take?

## 2011-01-13 ENCOUNTER — Encounter: Payer: Self-pay | Admitting: Internal Medicine

## 2011-01-13 ENCOUNTER — Ambulatory Visit (AMBULATORY_SURGERY_CENTER): Payer: PRIVATE HEALTH INSURANCE | Admitting: Internal Medicine

## 2011-01-13 ENCOUNTER — Other Ambulatory Visit: Payer: Self-pay | Admitting: Internal Medicine

## 2011-01-13 DIAGNOSIS — D371 Neoplasm of uncertain behavior of stomach: Secondary | ICD-10-CM

## 2011-01-13 DIAGNOSIS — D378 Neoplasm of uncertain behavior of other specified digestive organs: Secondary | ICD-10-CM

## 2011-01-13 DIAGNOSIS — K5090 Crohn's disease, unspecified, without complications: Secondary | ICD-10-CM

## 2011-01-13 DIAGNOSIS — D375 Neoplasm of uncertain behavior of rectum: Secondary | ICD-10-CM

## 2011-01-13 DIAGNOSIS — D849 Immunodeficiency, unspecified: Secondary | ICD-10-CM

## 2011-01-13 DIAGNOSIS — D126 Benign neoplasm of colon, unspecified: Secondary | ICD-10-CM

## 2011-01-13 DIAGNOSIS — R1033 Periumbilical pain: Secondary | ICD-10-CM

## 2011-01-13 DIAGNOSIS — K648 Other hemorrhoids: Secondary | ICD-10-CM

## 2011-01-13 MED ORDER — PAREGORIC 2 MG/5ML PO TINC: 5.0000 mL | Freq: Two times a day (BID) | ORAL | Status: AC

## 2011-01-13 MED ORDER — CHOLESTYRAMINE 4 G PO PACK: 1.0000 | PACK | Freq: Two times a day (BID) | ORAL | Status: DC

## 2011-01-13 MED ORDER — SODIUM CHLORIDE 0.9 % IV SOLN: 500.0000 mL | INTRAVENOUS | Status: DC

## 2011-01-13 NOTE — Progress Notes (Signed)
0940-Pt extremely nauseated, vomiting small amount of clear yellow liquid. Pt states that she has been sick since she started taking the prep for colonoscopy. MD notified. Per verbal order 12.77m IV Phenergan administered. Will continue to assess.  0955-Pt states that she is feeling much better. Nausea has subsided. Pt vitals stable, no complaints of pain, pt continuing to pass gas.

## 2011-01-13 NOTE — Patient Instructions (Signed)
Please refer to your blue and neon green sheets for instructions regarding diet and activity for the rest of today.  You may resume your medications as you would normally take them.   You will receive a letter in the mail in about two weeks regarding the results of the biopsies taken today.  Colitis Colitis is inflammation of the colon. Colitis can be a short-term or long-standing (chronic) illness. Crohn's disease and ulcerative colitis are 2 types of colitis which are chronic. They usually require lifelong treatment. CAUSES  There are many different causes of colitis, including:  Viruses.   Germs (bacteria).   Medicine reactions.  SYMPTOMS   Diarrhea.   Intestinal bleeding.   Pain.   Fever.   Throwing up (vomiting).   Tiredness (fatigue).   Weight loss.   Bowel blockage.  DIAGNOSIS  The diagnosis of colitis is based on examination and stool or blood tests. X-rays, CT scan, and colonoscopy may also be needed. TREATMENT  Treatment may include:  Fluids given through the vein (intravenously).   Bowel rest (nothing to eat or drink for a period of time).   Medicine for pain and diarrhea.   Medicines (antibiotics) that kill germs.   Cortisone medicines.   Surgery.  HOME CARE INSTRUCTIONS   Get plenty of rest.   Drink enough water and fluids to keep your urine clear or pale yellow.   Eat a well-balanced diet.   Call your caregiver for follow-up as recommended.  SEEK IMMEDIATE MEDICAL CARE IF:   You develop chills.   You have an oral temperature above 102 F (38.9 C), not controlled by medicine.   You have extreme weakness, fainting, or dehydration.   You have repeated vomiting.   You develop severe belly (abdominal) pain or are passing bloody or tarry stools.  MAKE SURE YOU:   Understand these instructions.   Will watch your condition.   Will get help right away if you are not doing well or get worse.  Document Released: 03/31/2004 Document  Revised: 11/03/2010 Document Reviewed: 06/26/2009 [facility] Patient Information 2012 [place].   Colon Polyps A polyp is extra tissue that grows inside your body. Colon polyps grow in the large intestine. The large intestine, also called the colon, is part of your digestive system. It is a long, hollow tube at the end of your digestive tract where your body makes and stores stool. Most polyps are not dangerous. They are benign. This means they are not cancerous. But over time, some types of polyps can turn into cancer. Polyps that are smaller than a pea are usually not harmful. But larger polyps could someday become or may already be cancerous. To be safe, doctors remove all polyps and test them.  WHO GETS POLYPS? Anyone can get polyps, but certain people are more likely than others. You may have a greater chance of getting polyps if:  You are over 50.   You have had polyps before.   Someone in your family has had polyps.   Someone in your family has had cancer of the large intestine.   Find out if someone in your family has had polyps. You may also be more likely to get polyps if you:   Eat a lot of fatty foods.   Smoke.   Drink alcohol.   Do not exercise.   Eat too much.  SYMPTOMS  Most small polyps do not cause symptoms. People often do not know they have one until their caregiver finds it  during a regular checkup or while testing them for something else. Some people do have symptoms like these:  Bleeding from the anus. You might notice blood on your underwear or on toilet paper after you have had a bowel movement.   Constipation or diarrhea that lasts more than a week.   Blood in the stool. Blood can make stool look black or it can show up as red streaks in the stool.  If you have any of these symptoms, see your caregiver. HOW DOES THE DOCTOR TEST FOR POLYPS? The doctor can use four tests to check for polyps:  Digital rectal exam. The caregiver wears gloves and  checks your rectum (the last part of the large intestine) to see if it feels normal. This test would find polyps only in the rectum. Your caregiver may need to do one of the other tests listed below to find polyps higher up in the intestine.   Barium enema. The caregiver puts a liquid called barium into your rectum before taking x-rays of your large intestine. Barium makes your intestine look white in the pictures. Polyps are dark, so they are easy to see.   Sigmoidoscopy. With this test, the caregiver can see inside your large intestine. A thin flexible tube is placed into your rectum. The device is called a sigmoidoscope, which has a light and a tiny video camera in it. The caregiver uses the sigmoidoscope to look at the last third of your large intestine.   Colonoscopy. This test is like sigmoidoscopy, but the caregiver looks at all of the large intestine. It usually requires sedation. This is the most common method for finding and removing polyps.  TREATMENT   The caregiver will remove the polyp during sigmoidoscopy or colonoscopy. The polyp is then tested for cancer.   If you have had polyps, your caregiver may want you to get tested regularly in the future.  PREVENTION  There is not one sure way to prevent polyps. You might be able to lower your risk of getting them if you:  Eat more fruits and vegetables and less fatty food.   Do not smoke.   Avoid alcohol.   Exercise every day.   Lose weight if you are overweight.   Eating more calcium and folate can also lower your risk of getting polyps. Some foods that are rich in calcium are milk, cheese, and broccoli. Some foods that are rich in folate are chickpeas, kidney beans, and spinach.   Aspirin might help prevent polyps. Studies are under way.  Document Released: 11/18/2003 Document Revised: 11/03/2010 Document Reviewed: 04/25/2007 [facility] Patient Information 2012 [place].

## 2011-01-14 ENCOUNTER — Other Ambulatory Visit (HOSPITAL_COMMUNITY): Payer: Self-pay | Admitting: *Deleted

## 2011-01-14 ENCOUNTER — Telehealth: Payer: Self-pay | Admitting: *Deleted

## 2011-01-14 NOTE — Telephone Encounter (Signed)

## 2011-01-18 ENCOUNTER — Encounter: Payer: Self-pay | Admitting: Internal Medicine

## 2011-01-19 ENCOUNTER — Other Ambulatory Visit (INDEPENDENT_AMBULATORY_CARE_PROVIDER_SITE_OTHER): Payer: PRIVATE HEALTH INSURANCE

## 2011-01-19 DIAGNOSIS — E876 Hypokalemia: Secondary | ICD-10-CM

## 2011-01-20 ENCOUNTER — Telehealth: Payer: Self-pay | Admitting: *Deleted

## 2011-01-20 NOTE — Telephone Encounter (Signed)
Message copied by Hulan Saas on Thu Jan 20, 2011  8:40 AM ------      Message from: Lafayette Dragon      Created: Wed Jan 19, 2011  7:14 PM       Please call pt with K+ results, back to normal but nshe ought to continue on 1 KDUR/day

## 2011-01-20 NOTE — Telephone Encounter (Signed)
Patient given results and recommendation.

## 2011-01-25 ENCOUNTER — Other Ambulatory Visit (HOSPITAL_COMMUNITY): Payer: Self-pay | Admitting: *Deleted

## 2011-01-28 ENCOUNTER — Encounter (HOSPITAL_COMMUNITY)
Admission: RE | Admit: 2011-01-28 | Discharge: 2011-01-28 | Disposition: A | Discharge status: Home / Self Care | Payer: PRIVATE HEALTH INSURANCE | Source: Ambulatory Visit | Attending: Internal Medicine | Admitting: Internal Medicine

## 2011-01-28 ENCOUNTER — Encounter (HOSPITAL_COMMUNITY): Payer: Self-pay

## 2011-01-28 DIAGNOSIS — Z9049 Acquired absence of other specified parts of digestive tract: Secondary | ICD-10-CM | POA: Insufficient documentation

## 2011-01-28 DIAGNOSIS — K5090 Crohn's disease, unspecified, without complications: Secondary | ICD-10-CM | POA: Insufficient documentation

## 2011-01-28 DIAGNOSIS — Z79899 Other long term (current) drug therapy: Secondary | ICD-10-CM | POA: Insufficient documentation

## 2011-01-28 MED ORDER — SODIUM CHLORIDE 0.9 % IV SOLN: Freq: Once | INTRAVENOUS | Status: DC

## 2011-01-28 MED ADMIN — Acetaminophen Tab 650 MG: 650 mg | ORAL | NDC 63739044001

## 2011-01-28 MED ADMIN — Diphenhydramine HCl Tab 25 MG: 50 mg | ORAL | NDC 00904530661

## 2011-01-28 MED ADMIN — INFLIXIMAB INFUSION: 1000 mg | INTRAVENOUS | NDC 57894003001

## 2011-01-28 MED FILL — Diphenhydramine HCl Tab 25 MG: ORAL | Qty: 2 | Status: AC

## 2011-01-28 MED FILL — Infliximab For IV Inj 100 MG: INTRAVENOUS | Qty: 100 | Status: AC

## 2011-01-28 MED FILL — Acetaminophen Tab 650 MG: ORAL | Qty: 650 | Status: AC

## 2011-01-28 NOTE — Progress Notes (Signed)
Pt had a brief wave of nausea but feels better now. She said this routinely happens often.

## 2011-02-03 ENCOUNTER — Other Ambulatory Visit: Payer: Self-pay | Admitting: Internal Medicine

## 2011-02-07 ENCOUNTER — Other Ambulatory Visit: Payer: Self-pay | Admitting: Internal Medicine

## 2011-02-11 ENCOUNTER — Encounter (HOSPITAL_COMMUNITY): Payer: PRIVATE HEALTH INSURANCE

## 2011-02-11 ENCOUNTER — Ambulatory Visit (INDEPENDENT_AMBULATORY_CARE_PROVIDER_SITE_OTHER): Payer: PRIVATE HEALTH INSURANCE | Admitting: Internal Medicine

## 2011-02-11 DIAGNOSIS — E538 Deficiency of other specified B group vitamins: Secondary | ICD-10-CM

## 2011-02-22 ENCOUNTER — Other Ambulatory Visit: Payer: Self-pay | Admitting: Internal Medicine

## 2011-03-10 ENCOUNTER — Other Ambulatory Visit: Payer: Self-pay | Admitting: Internal Medicine

## 2011-03-11 ENCOUNTER — Telehealth: Payer: Self-pay | Admitting: Internal Medicine

## 2011-03-11 ENCOUNTER — Encounter (HOSPITAL_COMMUNITY)
Admission: RE | Admit: 2011-03-11 | Discharge: 2011-03-11 | Disposition: A | Discharge status: Home / Self Care | Payer: PRIVATE HEALTH INSURANCE | Source: Ambulatory Visit | Attending: Internal Medicine | Admitting: Internal Medicine

## 2011-03-11 ENCOUNTER — Encounter (HOSPITAL_COMMUNITY): Admission: RE | Admit: 2011-03-11 | Payer: PRIVATE HEALTH INSURANCE | Source: Ambulatory Visit

## 2011-03-11 DIAGNOSIS — Z79899 Other long term (current) drug therapy: Secondary | ICD-10-CM | POA: Insufficient documentation

## 2011-03-11 DIAGNOSIS — K5090 Crohn's disease, unspecified, without complications: Secondary | ICD-10-CM | POA: Insufficient documentation

## 2011-03-11 DIAGNOSIS — Z9049 Acquired absence of other specified parts of digestive tract: Secondary | ICD-10-CM | POA: Insufficient documentation

## 2011-03-11 MED ORDER — SODIUM CHLORIDE 0.9 % IV SOLN
INTRAVENOUS | Status: DC
  Administered 2011-03-11: 10:00:00 via INTRAVENOUS

## 2011-03-11 MED ADMIN — Acetaminophen Tab 325 MG: 650 mg | ORAL | NDC 63739044001

## 2011-03-11 MED ADMIN — INFLIXIMAB INFUSION: 1000 mg | INTRAVENOUS | NDC 57894003001

## 2011-03-11 MED ADMIN — Diphenhydramine HCl Tab 25 MG: 50 mg | ORAL | NDC 00904530661

## 2011-03-11 MED FILL — Infliximab For IV Inj 100 MG: INTRAVENOUS | Qty: 100 | Status: AC

## 2011-03-11 NOTE — Telephone Encounter (Signed)
Spoke with short stay at [facility] they have the orders now.

## 2011-03-15 ENCOUNTER — Ambulatory Visit (INDEPENDENT_AMBULATORY_CARE_PROVIDER_SITE_OTHER): Payer: PRIVATE HEALTH INSURANCE | Admitting: Internal Medicine

## 2011-03-15 DIAGNOSIS — E538 Deficiency of other specified B group vitamins: Secondary | ICD-10-CM

## 2011-03-17 ENCOUNTER — Encounter: Payer: Self-pay | Admitting: *Deleted

## 2011-03-22 ENCOUNTER — Ambulatory Visit: Payer: PRIVATE HEALTH INSURANCE | Admitting: Internal Medicine

## 2011-03-25 ENCOUNTER — Encounter: Payer: Self-pay | Admitting: Internal Medicine

## 2011-03-25 ENCOUNTER — Ambulatory Visit: Payer: PRIVATE HEALTH INSURANCE | Admitting: Internal Medicine

## 2011-03-25 NOTE — Telephone Encounter (Signed)
Error

## 2011-04-01 ENCOUNTER — Other Ambulatory Visit (INDEPENDENT_AMBULATORY_CARE_PROVIDER_SITE_OTHER): Payer: PRIVATE HEALTH INSURANCE

## 2011-04-01 ENCOUNTER — Ambulatory Visit (INDEPENDENT_AMBULATORY_CARE_PROVIDER_SITE_OTHER): Payer: PRIVATE HEALTH INSURANCE | Admitting: Internal Medicine

## 2011-04-01 ENCOUNTER — Encounter: Payer: Self-pay | Admitting: Internal Medicine

## 2011-04-01 VITALS — BP 122/78 | HR 100 | Ht 68.0 in | Wt 216.6 lb

## 2011-04-01 DIAGNOSIS — K5090 Crohn's disease, unspecified, without complications: Secondary | ICD-10-CM

## 2011-04-01 DIAGNOSIS — T8189XA Other complications of procedures, not elsewhere classified, initial encounter: Secondary | ICD-10-CM

## 2011-04-01 DIAGNOSIS — D849 Immunodeficiency, unspecified: Secondary | ICD-10-CM

## 2011-04-01 LAB — ELECTROLYTE PANEL
CO2: 27 mEq/L (ref 19–32)
Chloride: 102 mEq/L (ref 96–112)
Potassium: 3.7 mEq/L (ref 3.5–5.1)

## 2011-04-01 MED ORDER — BUDESONIDE 3 MG PO CP24: 9.0000 mg | ORAL_CAPSULE | Freq: Every day | ORAL | Status: DC

## 2011-04-01 MED ORDER — PROMETHAZINE HCL 25 MG PO TABS: 25.0000 mg | ORAL_TABLET | Freq: Four times a day (QID) | ORAL | Status: DC | PRN

## 2011-04-01 MED ORDER — COLESTIPOL HCL 1 G PO TABS: 2.0000 g | ORAL_TABLET | Freq: Two times a day (BID) | ORAL | Status: DC

## 2011-04-01 MED ORDER — MESALAMINE ER 500 MG PO CPCR: 1000.0000 mg | ORAL_CAPSULE | Freq: Two times a day (BID) | ORAL | Status: DC

## 2011-04-01 NOTE — Patient Instructions (Addendum)
Your physician has requested that you go to the basement for the following lab work before leaving today: Electrolyte panel We have sent the following medications to your pharmacy for you to pick up at your convenience: Phenergan Colestid Pentasa Entecort CC: Dr Rocky Link, Dr Zenovia Jarred

## 2011-04-01 NOTE — Progress Notes (Signed)
[name] [birth year] MRN [id number]   History of Present Illness:  This is a 51 year old white female with Crohn's disease of the terminal ileum and small bowel initially diagnosed in a 2009 on a small bowel capsule endoscopy. She underwent a terminal ileal resection for an obstruction in 2010 and again in July 2012. Repeat small bowel capsule endoscopies x 2 have never reached the anastomosis due to slow transit. She still has abdominal pain and severe diarrhea. She has been on maximum medical therapy with Remicade 10 mg/kg every 6 weeks, Imuran 100 mg daily and a prednisone taper. She is currently down to 5 mg daily. She is much better when she is on prednisone 30 mg a day but we have been reducing it due to long-term side effects. She also takes Lomotil and Bentyl. Her last colonoscopy in March 2011 and before in 2009 showed Crohn's disease at the ileocolic anastomosis. She also had a tubular adenoma in the proximal colon .Her main complaint is nausea, abdominal pain and diarrhea. CT scans of the abdomen in October 2012 and in December 2012 did not show any evidence of active Crohn's disease. Her lab tests have been normal. I suspect she still has persistent Crohn's disease in the distal small bowel. In the meantime, she has also been diagnosed with fatty liver and hepatomegaly. She is overweight despite of the diarrhea but has lost 7 pounds since her last visit.   Past Medical History  Diagnosis Date  . Crohn's disease   . Depression   . Personal history of other diseases of digestive system   . Hiatal hernia   . Adenomatous colon polyp   . GERD (gastroesophageal reflux disease)   . Vitamin B12 deficiency   . SBO (small bowel obstruction)     partial secondary to crohns stricture  . Psoriasis   . Gastritis   . Abdominal pain   . Family history of malignant neoplasm of gastrointestinal tract    Past Surgical History  Procedure Date  . Partial hysterectomy   . Ileocecectomy  10/2008  . Carpal tunnel release     right  . Anal fissure repair   . Cholecystectomy   . Colon surgery     lap assisted ileocecectomy for crohns  . Colon surgery     right hemicolectomy for stricture    reports that she quit smoking about 27 years ago. Her smoking use included Cigarettes. She has a 10 pack-year smoking history. She has never used smokeless tobacco. She reports that she does not drink alcohol or use illicit drugs. family history includes Breast cancer in her paternal grandmother; Colon cancer in her mother; and Colon polyps in her brother, father, and mother. Allergies  Allergen Reactions  . Codeine Hives and Nausea And Vomiting        Review of Systems: Occasional heartburn. Positive for nausea. Positive for abdominal pain and diarrhea. Negative for rectal bleeding  The remainder of the 10 point ROS is negative except as outlined in H&P   Physical Exam: General appearance  Well developed, in no distress cushingoid-appearing Eyes- non icteric. HEENT nontraumatic, normocephalic. Mouth no lesions, tongue papillated, no cheilosis. Neck supple without adenopathy, thyroid not enlarged, no carotid bruits, no JVD. Lungs Clear to auscultation bilaterally. Cor normal S1, normal S2, regular rhythm, no murmur,  quiet precordium. Abdomen: Truncal obesity. Normal active bowel sounds. Diffusely tender in all quadrants more so in left middle quadrant. Liver edge at costal margin. No ascites. Rectal: Not done  today Extremities no pedal edema. Skin no lesions. Neurological alert and oriented x 3. Psychological normal mood and affect.  Assessment and Plan:  Problem #1 Suspect active Crohn's disease in the distal small bowel because she is having pain and the capsule has been unable to pass despite of  resection of the obstructed segment.. She has persistent symptoms of nausea, diarrhea and abdominal pain. She has a very complex medical therapy. 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 Causing choleretic diarrhea and bacterial overgrowth. She takes Flagyl for it. We will start her on Colestid 2 g twice a day, she could not tolerate Cholestyramine. She is to continue Lomotil tid and Bentyl. She will stay on prednisone 5 mg daily and willstart her on Entocort 9 mg daily ( she was on it in the past). We will also restart her Pentasa 1000 mg twice a day. She will stay on a low-residue diet as well. I would like to get another opinion from Dr Hilarie Fredrickson as far as how to further manage her disease diagnostically and therapeutically. She would not be a good candidate for ileostomy because of suspected Crohn's disease in the distal small bowel, pt inquired about possibility of that ,because  it was mentioned to her by Dr Donne Hazel.   04/01/2011 [name]

## 2011-04-04 ENCOUNTER — Telehealth: Payer: Self-pay | Admitting: *Deleted

## 2011-04-04 NOTE — Telephone Encounter (Signed)
Spoke with patient and gave her results and recommendation as per Dr. Olevia Perches.

## 2011-04-04 NOTE — Telephone Encounter (Signed)
Message copied by Hulan Saas on Mon Apr 04, 2011 10:34 AM ------      Message from: Lafayette Dragon      Created: Ludwig Clarks Apr 01, 2011  9:38 PM       Please call pt with normal K+. Continue supplement.

## 2011-04-08 ENCOUNTER — Ambulatory Visit: Payer: PRIVATE HEALTH INSURANCE | Admitting: Internal Medicine

## 2011-04-12 ENCOUNTER — Other Ambulatory Visit: Payer: Self-pay | Admitting: Internal Medicine

## 2011-04-13 ENCOUNTER — Encounter: Payer: Self-pay | Admitting: Internal Medicine

## 2011-04-18 ENCOUNTER — Encounter: Payer: Self-pay | Admitting: Internal Medicine

## 2011-04-18 ENCOUNTER — Ambulatory Visit (INDEPENDENT_AMBULATORY_CARE_PROVIDER_SITE_OTHER): Payer: PRIVATE HEALTH INSURANCE | Admitting: Internal Medicine

## 2011-04-18 DIAGNOSIS — K5000 Crohn's disease of small intestine without complications: Secondary | ICD-10-CM

## 2011-04-18 DIAGNOSIS — K648 Other hemorrhoids: Secondary | ICD-10-CM

## 2011-04-18 DIAGNOSIS — D849 Immunodeficiency, unspecified: Secondary | ICD-10-CM

## 2011-04-18 DIAGNOSIS — R1033 Periumbilical pain: Secondary | ICD-10-CM

## 2011-04-18 DIAGNOSIS — K5090 Crohn's disease, unspecified, without complications: Secondary | ICD-10-CM

## 2011-04-18 DIAGNOSIS — T8189XA Other complications of procedures, not elsewhere classified, initial encounter: Secondary | ICD-10-CM

## 2011-04-18 MED ORDER — HYDROCODONE-ACETAMINOPHEN 10-325 MG PO TABS: 1.0000 | ORAL_TABLET | Freq: Four times a day (QID) | ORAL | Status: DC | PRN

## 2011-04-18 MED ORDER — PREDNISONE 5 MG PO TABS: 2.5000 mg | ORAL_TABLET | Freq: Every day | ORAL | Status: DC

## 2011-04-18 MED ORDER — CYANOCOBALAMIN 1000 MCG/ML IJ SOLN
1000.0000 ug | INTRAMUSCULAR | Status: AC
  Administered 2011-07-13: 1000 ug via INTRAMUSCULAR

## 2011-04-18 MED ORDER — BUDESONIDE 3 MG PO CP24: 6.0000 mg | ORAL_CAPSULE | Freq: Every day | ORAL | Status: DC

## 2011-04-18 MED ORDER — LOPERAMIDE HCL 2 MG PO TABS: 2.0000 mg | ORAL_TABLET | Freq: Four times a day (QID) | ORAL | Status: AC | PRN

## 2011-04-18 NOTE — Patient Instructions (Signed)
You have been scheduled for an endoscopy. Please follow written instructions given to you at your visit today.  We have sent the following medications to your pharmacy for you to pick up at your convenience: Prednisone, you are to take 89m for 14 days then stop taking. Enticort 990m For 2 weeks then decrease to 3m42mor 1 month, then stop taking.  Imodium, take as prescribed. Norco, take as prescribed.   You are to follow up with Dr. BroOlevia Perches 2-4 weeks.

## 2011-04-18 NOTE — Progress Notes (Signed)
Subjective:    Patient ID: [name], female    DOB: 1960-03-13, 51 y.o.   MRN: [id number]  HPI [name] is a 51 yo female with a PMH of stricturing ileal Crohn's disease s/p resection with primary anastomosis x 2, GERD, B12 deficiency, colonic tubular adenomas, IBS, and depression who is seen for second opinion regarding her Crohn's disease. She is a long-standing patient of Dr. [name].  [name]'s Crohn's disease history dates back to approximately 2008, and currently she is on Remicade 10 mg per kilogram every 6 weeks, azathioprine, Pentasa, budesonide 9 mg daily, and low-dose prednisone 5 mg daily. Her last colonoscopy was performed in December 2012 which revealed several tubular adenomas and evaluation of her neo--terminal ileum was normal without evidence of active enteritis or stricture.  Today she reports ongoing issues with diarrhea, nausea, and abdominal pain. When asked what her biggest symptom is, she states abdominal pain. Her abdominal pain is present all over her abdomen and is worse after eating. She reports an overall poor appetite, however has not lost a significant amount of weight. She reports frequent nausea with rare vomiting. She does report some abdominal bloating and lower gas. Regarding her diarrhea, she reports a good day is 7-8 stools, but often she goes as many as 21 times a day. She reports having nocturnal stools on most nights as many as 3 times in a night. She is taking Colestid 2 tablets twice daily, Lomotil 1 tablet 3 times a day. Without these medications, she fears she would have even more bowel movements. She denies fever and chills, but admits to occasional night sweats. She denies blood in her stools and melena. She does occasionally report bright red blood with wiping but this is a scant amount. Her budesonide was started recently, and she feels her stools may have a bit more form than prior. Again, regarding the pain, she is using  hydrocodone on a daily basis, and states this is enough to make the pain "tolerable".  She denies rashes, eye pain.  She notes some lower back and hip pain, which is not new.    Review of Systems As per HPI  Patient Active Problem List  Diagnoses  . VITAMIN B12 DEFICIENCY  . DEPRESSION  . HEARING LOSS  . GERD  . HIATAL HERNIA  . CROHN'S DISEASE  . SBO  . PSORIASIS  . DIARRHEA  . COLONIC POLYPS, ADENOMATOUS, HX OF  . COUGH  . Asthma  . Weight increase  . Wears dentures  . Nausea & vomiting   . IBS (irritable bowel syndrome)  . Hiatal hernia  . Reflux  . Abdominal pain  . Rectal bleeding  . Hemorrhoids  . Sinus problem/runny nose  . Wears glasses   Past Surgical History  Procedure Date  . Partial hysterectomy   . Ileocecectomy 10/2008  . Carpal tunnel release     right  . Anal fissure repair   . Cholecystectomy   . Colon surgery     lap assisted ileocecectomy for crohns  . Colon surgery     right hemicolectomy for stricture   Current Outpatient Prescriptions  Medication Sig Dispense Refill  . albuterol (PROAIR HFA) 108 (90 BASE) MCG/ACT inhaler Inhale 2 puffs into the lungs as needed.        Marland Kitchen amLODipine (NORVASC) 10 MG tablet Take 1 tablet by mouth daily.      Marland Kitchen azaTHIOprine (IMURAN) 50 MG tablet Take 2 tablets once daily  60  tablet  3  . budesonide (ENTOCORT EC) 3 MG 24 hr capsule Take 2 capsules (6 mg total) by mouth daily.  30 capsule  0  . budesonide-formoterol (SYMBICORT) 80-4.5 MCG/ACT inhaler Inhale 2 puffs into the lungs 2 (two) times daily.  1 Inhaler  3  . colestipol (COLESTID) 1 G tablet Take 2 tablets (2 g total) by mouth 2 (two) times daily.  120 tablet  3  . cyclobenzaprine (FLEXERIL) 10 MG tablet TAKE 1 TABLET AT BEDTIME AS NEEDED.  30 tablet  2  . dicyclomine (BENTYL) 20 MG tablet Take one po TID  90 tablet  3  . diphenoxylate-atropine (LOMOTIL) 2.5-0.025 MG per tablet Take 1 tablet by mouth 3 (three) times daily.  90 tablet  3  . eszopiclone  (LUNESTA) 2 MG TABS Take 2 mg by mouth at bedtime as needed. Take immediately before bedtime       . fluconazole (DIFLUCAN) 100 MG tablet TAKE 1 TABLET ONCE PER WEEK FOR 12 WEEKS AS NEEDED.  12 tablet  0  . fluticasone (FLONASE) 50 MCG/ACT nasal spray 1 spray by Nasal route 2 (two) times daily.        . hydrocortisone-pramoxine (ANALPRAM-HC) 2.5-1 % rectal cream Place rectally 2 (two) times daily as needed.  30 g  3  . inFLIXimab (REMICADE) 100 MG injection Inject 10 mg/kg into the vein every 6 (six) weeks.       . mesalamine (CANASA) 1000 MG suppository Place 1 suppository (1,000 mg total) rectally 2 (two) times daily.  30 suppository  2  . omeprazole (PRILOSEC) 20 MG capsule Take 20 mg by mouth daily.        . predniSONE (DELTASONE) 5 MG tablet Take 0.5 tablets (2.5 mg total) by mouth daily.  14 tablet  0  . HYDROcodone-acetaminophen (NORCO) 10-325 MG per tablet Take 1 tablet by mouth every 6 (six) hours as needed for pain.  60 tablet  1  . loperamide (IMODIUM A-D) 2 MG tablet Take 1 tablet (2 mg total) by mouth 4 (four) times daily as needed for diarrhea or loose stools.  30 tablet  0  . promethazine (PHENERGAN) 25 MG tablet Take 1 tablet (25 mg total) by mouth every 6 (six) hours as needed for nausea.  30 tablet  1   Current Facility-Administered Medications  Medication Dose Route Frequency Provider Last Rate Last Dose  . acetaminophen (TYLENOL) CR tablet 650 mg  650 mg Oral See admin instructions Lafayette Dragon, MD      . cyanocobalamin ((VITAMIN B-12)) injection 1,000 mcg  1,000 mcg Intramuscular Q30 days Lafayette Dragon, MD   1,000 mcg at 04/18/11 1513  . cyanocobalamin ((VITAMIN B-12)) injection 1,000 mcg  1,000 mcg Intramuscular Q30 days Zenovia Jarred, MD      . diphenhydrAMINE (BENADRYL) capsule 50 mg  50 mg Oral See admin instructions Lafayette Dragon, MD      . inFLIXimab (REMICADE) 10 mg/kg = 1,000 mg in sodium chloride 0.9 % 250 mL infusion  10 mg/kg Intravenous See admin instructions Lafayette Dragon, MD       Allergies  Allergen Reactions  . Codeine Hives and Nausea And Vomiting    Social History  . Marital Status: Married    Number of Children: 2   Occupational History  . cook    Social History Main Topics  . Smoking status: Former Smoker -- 1.0 packs/day for 10 years    Types: Cigarettes    Quit date:  03/07/1984  . Smokeless tobacco: Never Used  . Alcohol Use: No     rare  . Drug Use: No   Social History Narrative  . None   Family History  Problem Relation Age of Onset  . Colon cancer Mother   . Breast cancer Paternal Grandmother   . Colon polyps Mother   . Colon polyps Father   . Colon polyps Brother       Objective:   Physical Exam BP 136/78  Pulse 100  Ht 5' 8"  (1.727 m)  Wt 222 lb (100.699 kg)  BMI 33.76 kg/m2 Constitutional: Well-developed and well-nourished. No distress. HEENT: Normocephalic and atraumatic. Oropharynx is clear and moist. No oropharyngeal exudate. Conjunctivae are normal. Pupils are equal round and reactive to light. No scleral icterus. Neck: Neck supple. Trachea midline. Cardiovascular: Borderline tachycardia, but regular rhythm. No M/R/G Pulmonary/chest: Effort normal and breath sounds normal. No wheezing, rales or rhonchi. Abdominal: Soft, diffuse tenderness to palpation throughout the abdomen, there is a well-healed midline abdominal scar near the umbilicus, mild distention.  Bowel sounds active throughout. There are no masses palpable.  Extremities: no clubbing, cyanosis, or edema Lymphadenopathy: No cervical adenopathy noted. Neurological: Alert and oriented to person place and time. Skin: Skin is warm and dry. No rashes noted. Psychiatric: Normal mood and affect. Behavior is normal.  Chart reviewed, including imaging, procedures (colonoscopy and capsule endo), labs.    Assessment & Plan:  51 yo female with a PMH of stricturing ileal Crohn's disease s/p resection with primary anastomosis x 2, GERD, B12 deficiency,  colonic tubular adenomas, IBS, and depression who is seen for second opinion regarding her Crohn's disease  1. Crohn's disease -- [name]'s Crohn's disease an overall abdominal symptoms are quite complex.  Right now she is on maximal Crohn's therapy with maximum dose infliximab at an interval of 6 weeks, azathioprine, a 5-ASA compound, and steroids.  It is difficult to know right now, should her symptoms is related to active Crohn's disease.  The objective evidence that we have most recently (colonoscopy from December 2012, and CT scan from 03/03/2011) did not show active disease.  It is possible that she has active Crohn's disease more proximal to the examined portion of her ileum, or even in her stomach or upper small bowel.  At this point, given her nausea and abdominal pain, I feel an EGD is reasonable to evaluate the upper tract and rule out more proximal Crohn's.  Regarding her abdominal pain, she could have Crohn's enteritis (more proximal than the neoterminal ileum), adhesive disease, SIBO (as been treated with metronidazole in the past, but not currently), IBS (likely contributing, but difficult to know to what degree), or the combination of any of these.  At this point, I think a more objective evidence we have, the easier it will be to decide on therapy.  We have discussed MR enterography to better visualize the small bowel and evaluate for strictures, fistulas, and active Crohn's (I selected this test over a CT scan given the radiation exposure, and over a small bowel follow-through due to her poor tolerance of this test in May 2012).  Regarding therapy, I recommended stopping Pentasa, as she has not seem to benefit much to this point, and it is less likely to be helpful in Crohn's.  I also think it is reasonable to wean off the prednisone entirely, given that she is on budesonide. I've asked that she take prednisone 2.5 mg for 2 weeks and then stop. She will  continue budesonide 9 mg for a  total of one month, in an attempt to decrease the dose to 6 mg daily.  She will continue with her regular schedule of infliximab and azathioprine (of note she previously failed adalimumab).  Regarding her diarrhea, she will continue the Colestid and Lomotil, and I will add loperamide 2 mg with meals and at bedtime.  She can titrate the dose of loperamide Based on her diarrhea, not to exceed 16 mg daily. Also, biopsies done at her last colonoscopy in November 2012, had no evidence of microscopic colitis, or active colitis  Finally, Dr. Olevia Perches will followup with the patient's colonoscopies regarding her hx of tubular adenomas.  It does not appear that her tubular adenomas were arising in areas of colon involved with inflammatory bowel disease, and thus most likely represent sporadic adenomas.  I appreciate the opportunity to see this very pleasant 51 year old female and to be able to participate in her care

## 2011-04-22 ENCOUNTER — Encounter (HOSPITAL_COMMUNITY): Payer: Self-pay

## 2011-04-22 ENCOUNTER — Encounter (HOSPITAL_COMMUNITY)
Admission: RE | Admit: 2011-04-22 | Discharge: 2011-04-22 | Disposition: A | Discharge status: Home / Self Care | Payer: PRIVATE HEALTH INSURANCE | Source: Ambulatory Visit | Attending: Internal Medicine | Admitting: Internal Medicine

## 2011-04-22 DIAGNOSIS — Z79899 Other long term (current) drug therapy: Secondary | ICD-10-CM | POA: Insufficient documentation

## 2011-04-22 DIAGNOSIS — K5090 Crohn's disease, unspecified, without complications: Secondary | ICD-10-CM | POA: Insufficient documentation

## 2011-04-22 DIAGNOSIS — Z9049 Acquired absence of other specified parts of digestive tract: Secondary | ICD-10-CM | POA: Insufficient documentation

## 2011-04-22 MED ORDER — DIPHENHYDRAMINE HCL 25 MG PO CAPS
25.0000 mg | ORAL_CAPSULE | ORAL | Status: DC
Start: 2011-04-22 — End: 2011-04-23
  Administered 2011-04-22: 50 mg via ORAL

## 2011-04-22 MED ORDER — SODIUM CHLORIDE 0.9 % IV SOLN
INTRAVENOUS | Status: DC
  Administered 2011-04-22: 250 mL via INTRAVENOUS

## 2011-04-22 MED ADMIN — Acetaminophen Tab 325 MG: 650 mg | ORAL | NDC 63739044001

## 2011-04-22 MED ADMIN — INFLIXIMAB INFUSION: 1000 mg | INTRAVENOUS | NDC 57894003001

## 2011-04-22 MED FILL — Infliximab For IV Inj 100 MG: INTRAVENOUS | Qty: 100 | Status: AC

## 2011-04-22 NOTE — Discharge Instructions (Signed)
Infliximab injection What is this medicine? INFLIXIMAB (in [place] i mab) is used to treat Crohn's disease and ulcerative colitis. It is also used to treat ankylosing spondylitis, psoriasis, and some forms of arthritis. This medicine may be used for other purposes; ask your health care provider or pharmacist if you have questions. What should I tell my health care provider before I take this medicine? They need to know if you have any of these conditions: -diabetes -exposure to tuberculosis -heart failure -hepatitis or liver disease -immune system problems -infection -lung or breathing disease, like COPD -multiple sclerosis -current or past resident of [place] or [place] -seizure disorder -an unusual or allergic reaction to infliximab, mouse proteins, other medicines, foods, dyes, or preservatives -pregnant or trying to get pregnant -breast-feeding How should I use this medicine? This medicine is for injection into a vein. It is usually given by a health care professional in a hospital or clinic setting. A special MedGuide will be given to you by the pharmacist with each prescription and refill. Be sure to read this information carefully each time. Talk to your pediatrician regarding the use of this medicine in children. Special care may be needed. Overdosage: If you think you have taken too much of this medicine contact a poison control center or emergency room at once. NOTE: This medicine is only for you. Do not share this medicine with others. What if I miss a dose? It is important not to miss your dose. Call your doctor or health care professional if you are unable to keep an appointment. What may interact with this medicine? Do not take this medicine with any of the following medications: -anakinra -rilonacept This medicine may also interact with the following medications: -vaccines This list may not describe all possible interactions. Give your health care provider  a list of all the medicines, herbs, non-prescription drugs, or dietary supplements you use. Also tell them if you smoke, drink alcohol, or use illegal drugs. Some items may interact with your medicine. What should I watch for while using this medicine? Visit your doctor or health care professional for regular checks on your progress. If you get a cold or other infection while receiving this medicine, call your doctor or health care professional. Do not treat yourself. This medicine may decrease your body's ability to fight infections. Before beginning therapy, your doctor may do a test to see if you have been exposed to tuberculosis. This medicine may make the symptoms of heart failure worse in some patients. If you notice symptoms such as increased shortness of breath or swelling of the ankles or legs, contact your health care provider right away. If you are going to have surgery or dental work, tell your health care professional or dentist that you have received this medicine. If you take this medicine for plaque psoriasis, stay out of the sun. If you cannot avoid being in the sun, wear protective clothing and use sunscreen. Do not use sun lamps or tanning beds/booths. What side effects may I notice from receiving this medicine? Side effects that you should report to your doctor or health care professional as soon as possible: -allergic reactions like skin rash, itching or hives, swelling of the face, lips, or tongue -chest pain -fever or chills, usually related to the infusion -muscle or joint pain -red, scaly patches or raised bumps on the skin -signs of infection - fever or chills, cough, sore throat, pain or difficulty passing urine -swollen lymph nodes in the neck, underarm,  or groin areas -unexplained weight loss -unusual bleeding or bruising -unusually weak or tired -yellowing of the eyes or skin Side effects that usually do not require medical attention (report to your doctor or health  care professional if they continue or are bothersome): -headache -heartburn or stomach pain -nausea, vomiting This list may not describe all possible side effects. Call your doctor for medical advice about side effects. You may report side effects to FDA at 1-800-FDA-1088. Where should I keep my medicine? This drug is given in a hospital or clinic and will not be stored at home. NOTE: This sheet is a summary. It may not cover all possible information. If you have questions about this medicine, talk to your doctor, pharmacist, or health care provider.  2012, Elsevier/Gold Standard. (10/10/2007 10:26:02 AM)

## 2011-04-27 ENCOUNTER — Other Ambulatory Visit: Payer: Self-pay | Admitting: Internal Medicine

## 2011-04-27 DIAGNOSIS — K5090 Crohn's disease, unspecified, without complications: Secondary | ICD-10-CM

## 2011-04-27 NOTE — Telephone Encounter (Signed)
Dr Olevia Perches has spoken to me and advised that per a conversation between she and Dr Hilarie Fredrickson, she would like patient to have an MR enterography of the abdomen and pelvis (they do NOT want CT enterography due to increased exposure to radiation from multiple CTs previously) to rule out active crohns disease. Patient should have also already discontinued prednisone and Pentasa but she would like me to reiterate this. She would also like patient to follow up with her in the office. Office follow up has already been scheduled. I have scheduled the MR enterography of the abdomen and pelvis for Saturday, 05/07/11 @ 8 am [facility] radiology. I have spoken to patient regarding Dr Olevia Perches and Dr Vena Rua recommendations and she verbalizes understanding. She also verbalizes understanding that she should go for MR enterography on 05/07/11.

## 2011-04-28 ENCOUNTER — Other Ambulatory Visit: Payer: Self-pay | Admitting: Internal Medicine

## 2011-04-29 ENCOUNTER — Encounter: Payer: Self-pay | Admitting: Gastroenterology

## 2011-05-06 ENCOUNTER — Telehealth: Payer: Self-pay | Admitting: Internal Medicine

## 2011-05-06 NOTE — Telephone Encounter (Signed)
Spoke with patient and she states she was not told if she could eat or drink prior to MRI tomorrow. Per Nicole Kindred at radiology- NPO 4 hours prior. Patient notified.

## 2011-05-07 ENCOUNTER — Inpatient Hospital Stay (HOSPITAL_COMMUNITY): Admission: RE | Admit: 2011-05-07 | Payer: PRIVATE HEALTH INSURANCE | Source: Ambulatory Visit

## 2011-05-09 ENCOUNTER — Telehealth: Payer: Self-pay | Admitting: *Deleted

## 2011-05-09 MED ORDER — PREDNISONE 1 MG PO TABS: ORAL_TABLET | ORAL | Status: DC

## 2011-05-09 NOTE — Telephone Encounter (Signed)
I have spoken to the pt: I will not reschedule her MRI until enteroscopy and OV on 3/15.  Also, I have asked het to decrease the prednisone to 4 mg/day. Please send Prednisone 1 mg, #100, take as directed. 1 refill.

## 2011-05-09 NOTE — Telephone Encounter (Signed)
Patient refused to have MRI on Saturday because she had to drink contrast. Patient told them she vomits contrast and she will not drink it.

## 2011-05-17 ENCOUNTER — Encounter: Payer: Self-pay | Admitting: Internal Medicine

## 2011-05-17 ENCOUNTER — Ambulatory Visit (AMBULATORY_SURGERY_CENTER): Payer: PRIVATE HEALTH INSURANCE | Admitting: Internal Medicine

## 2011-05-17 DIAGNOSIS — D1330 Benign neoplasm of unspecified part of small intestine: Secondary | ICD-10-CM

## 2011-05-17 DIAGNOSIS — K2960 Other gastritis without bleeding: Secondary | ICD-10-CM

## 2011-05-17 DIAGNOSIS — K5000 Crohn's disease of small intestine without complications: Secondary | ICD-10-CM

## 2011-05-17 DIAGNOSIS — K319 Disease of stomach and duodenum, unspecified: Secondary | ICD-10-CM

## 2011-05-17 DIAGNOSIS — R109 Unspecified abdominal pain: Secondary | ICD-10-CM

## 2011-05-17 DIAGNOSIS — R197 Diarrhea, unspecified: Secondary | ICD-10-CM

## 2011-05-17 MED ORDER — SODIUM CHLORIDE 0.9 % IV SOLN: 500.0000 mL | INTRAVENOUS | Status: DC

## 2011-05-17 NOTE — Patient Instructions (Signed)
YOU HAD AN ENDOSCOPIC PROCEDURE TODAY AT [facility]: Refer to the procedure report that was given to you for any specific questions about what was found during the examination.  If the procedure report does not answer your questions, please call your gastroenterologist to clarify.  If you requested that your care partner not be given the details of your procedure findings, then the procedure report has been included in a sealed envelope for you to review at your convenience later.  YOU SHOULD EXPECT: Some feelings of bloating in the abdomen. Passage of more gas than usual.  Walking can help get rid of the air that was put into your GI tract during the procedure and reduce the bloating. If you had a lower endoscopy (such as a colonoscopy or flexible sigmoidoscopy) you may notice spotting of blood in your stool or on the toilet paper. If you underwent a bowel prep for your procedure, then you may not have a normal bowel movement for a few days.  DIET: Your first meal following the procedure should be a light meal and then it is ok to progress to your normal diet.  A half-sandwich or bowl of soup is an example of a good first meal.  Heavy or fried foods are harder to digest and may make you feel nauseous or bloated.  Likewise meals heavy in dairy and vegetables can cause extra gas to form and this can also increase the bloating.  Drink plenty of fluids but you should avoid alcoholic beverages for 24 hours.  ACTIVITY: Your care partner should take you home directly after the procedure.  You should plan to take it easy, moving slowly for the rest of the day.  You can resume normal activity the day after the procedure however you should NOT DRIVE or use heavy machinery for 24 hours (because of the sedation medicines used during the test).    SYMPTOMS TO REPORT IMMEDIATELY: A gastroenterologist can be reached at any hour.  During normal business hours, 8:30 AM to 5:00 PM Monday through Friday,  call [phone].  After hours and on weekends, please call the GI answering service at [phone] who will take a message and have the physician on call contact you.   Following lower endoscopy (colonoscopy or flexible sigmoidoscopy):  Excessive amounts of blood in the stool  Significant tenderness or worsening of abdominal pains  Swelling of the abdomen that is new, acute  Fever of 100F or higher  Following upper endoscopy (EGD)  Vomiting of blood or coffee ground material  New chest pain or pain under the shoulder blades  Painful or persistently difficult swallowing  New shortness of breath  Fever of 100F or higher  Black, tarry-looking stools  FOLLOW UP: If any biopsies were taken you will be contacted by phone or by letter within the next 1-3 weeks.  Call your gastroenterologist if you have not heard about the biopsies in 3 weeks.  Our staff will call the home number listed on your records the next business day following your procedure to check on you and address any questions or concerns that you may have at that time regarding the information given to you following your procedure. This is a courtesy call and so if there is no answer at the home number and we have not heard from you through the emergency physician on call, we will assume that you have returned to your regular daily activities without incident.  SIGNATURES/CONFIDENTIALITY: You and/or your care  partner have signed paperwork which will be entered into your electronic medical record.  These signatures attest to the fact that that the information above on your After Visit Summary has been reviewed and is understood.  Full responsibility of the confidentiality of this discharge information lies with you and/or your care-partner.   HANDOUT ON GASTRITIS, ESOPHAGITIS CONTINUE ALL YOUR MEDICINES AS DIRECTED WE WILL MAIL YOU A LETTER IN 1-2 WEEKS WITH THE BIOPSY RESULTS AND DR Nichola Sizer RECOMMENDATIONS

## 2011-05-17 NOTE — Op Note (Signed)
[facility] [address]. [place], [place]  [address]  ENDOSCOPY PROCEDURE REPORT  PATIENT:  Jessica, [name]  MR#:  [id number] BIRTHDATE:  1960/08/14, 50 yrs. old  GENDER:  female  ENDOSCOPIST:  Lowella Bandy. Olevia Perches, MD Referred by:  Gerrit Heck, M.D.  PROCEDURE DATE:  05/17/2011 PROCEDURE:  enteroscopy 93112 with biopsy ASA CLASS:  Class III INDICATIONS:  abdominal pain Crohn's disease of the small bowl, steroid dependent,abd. pain refractory to meds,, SBCE shows disease in distal SB  MEDICATIONS:   MAC sedation, administered by CRNA, propofol (Diprivan) 380 mg TOPICAL ANESTHETIC:  none  DESCRIPTION OF PROCEDURE:   After the risks benefits and alternatives of the procedure were thoroughly explained, informed consent was obtained.  The LB GIF-H180 [id number] endoscope was introduced through the mouth and advanced to the proximal jejunum, without limitations.  The instrument was slowly withdrawn as the mucosa was fully examined. <<PROCEDUREIMAGES>>  Mild gastritis was found. bile reflux, focal erythema, With standard forceps, a biopsy was obtained and sent to pathology (see image5, image14, image13, and image12).  The duodenal bulb was normal in appearance, as was the postbulbar duodenum. and the proximal jejunum. Random biopsies were obtained and sent to pathology (see image10, image9, image8, image6, and image7). Bx's from 140-160 cm, also biopsies fro 100- 120 cm  Esophagitis was found (see image4, image3, image2, and image1).    Retroflexed views revealed no abnormalities.    The scope was then withdrawn from the patient and the procedure completed.  COMPLICATIONS:  None  ENDOSCOPIC IMPRESSION: 1) Mild gastritis 2) Normal duodenum and proximal jejunum to 160 cm, well beyond Ligament of Treitz 3) Esophagitis RECOMMENDATIONS: 1) Await biopsy results there is no endoscopic evidence of Crohn's disease, SBCE showed ulcerations in the distal small bowl, she  will continue to taper her steroids by 56m/  month and remain on Imuran, and biologicals   REPEAT EXAM:  In 0 year(s) for.  ______________________________ DLowella Bandy BOlevia Perches MD  CC:  n. eSIGNED:   DLowella Bandy [name] at 05/17/2011 10:50 AM  MAzzie Glatter [phone]

## 2011-05-17 NOTE — Progress Notes (Signed)
PT STATED TO DR BRODIE HAD ABDOMINAL PAIN LIKE GAS PAINS AND DR BRODIE ORDERED LEVSIN SL X2. GIVEN TO PT AS DIRECTED. EWM  PT STATES LEVSIN DID RELIEVE GAS PAIN. SHE DID PASS SOME GAS AND HAS BURPED A LOT. EWM  PT STATES SHE DID NOT TAKE HER BLOOD PRESSURE MEDS THIS AM PRIOR TO PROCEDURE THAT'S WHY BP A BIT ON THE HIGH SIDE.  SHE STATES SHE ALSO HAS ELEVATED BP WHEN SHE IS IN HOSPITAL OR DOCTOR'S OFFICE SETTINGS. RECOMMENDED PT TAKE HER MEDS ONCE HOME AND HAS EATEN. EWM  Patient did not experience any of the following events: a burn prior to discharge; a fall within the facility; wrong site/side/patient/procedure/implant event; or a hospital transfer or hospital admission upon discharge from the facility. [phone]) Patient did not have preoperative order for IV antibiotic SSI prophylaxis. [phone])

## 2011-05-18 ENCOUNTER — Telehealth: Payer: Self-pay | Admitting: *Deleted

## 2011-05-18 NOTE — Telephone Encounter (Signed)
  Follow up Call-  Call back number 05/17/2011 01/13/2011  Post procedure Call Back phone  # [phone] ok to leave a message  Permission to leave phone message Yes -     Patient questions:  Do you have a fever, pain , or abdominal swelling? no Pain Score  0 *  Have you tolerated food without any problems? no  Have you been able to return to your normal activities? yes  Do you have any questions about your discharge instructions: Diet   no Medications  no Follow up visit  no  Do you have questions or concerns about your Care? no  Actions: * If pain score is 4 or above: No action needed, pain <4.

## 2011-05-20 ENCOUNTER — Ambulatory Visit (INDEPENDENT_AMBULATORY_CARE_PROVIDER_SITE_OTHER): Payer: PRIVATE HEALTH INSURANCE | Admitting: Internal Medicine

## 2011-05-20 ENCOUNTER — Encounter: Payer: Self-pay | Admitting: Internal Medicine

## 2011-05-20 VITALS — BP 118/72 | HR 88 | Ht 68.0 in | Wt 222.5 lb

## 2011-05-20 DIAGNOSIS — K5000 Crohn's disease of small intestine without complications: Secondary | ICD-10-CM

## 2011-05-20 DIAGNOSIS — Z79899 Other long term (current) drug therapy: Secondary | ICD-10-CM

## 2011-05-20 MED ORDER — OMEPRAZOLE 20 MG PO CPDR: 20.0000 mg | DELAYED_RELEASE_CAPSULE | Freq: Two times a day (BID) | ORAL | Status: DC

## 2011-05-20 NOTE — Patient Instructions (Addendum)
You have been scheduled for a MRI entero at [facility] on 04/26/11 at 12:00pm. Please arrive 15 minutes early for registration. Nothing to eat or drink 6 hours prior. Take phenergan tablets by mouth the morning of your procedure at 5:00am and 10:00am. If you cannot keep the tablets down, please use your phenergan suppositories.  We have sent the following medications to your pharmacy for you to pick up at your convenience: Prilosec 20 mg twice daily.   cc: Gerrit Heck, MD

## 2011-05-20 NOTE — Progress Notes (Signed)
[name] August 13, 1960 MRN [id number]        History of Present Illness:  This is a 51 year old white female with Crohn's disease of the small bowel. She just underwent enteroscopy and biopsies. There was no gross evidence of Crohn's disease. She  Had a terminal ileal resection in 2008 x 2 and has been on maximum medical therapy including  Remicade 10 mg per kilogram, Imuran 100 mg a day, Entocort 6 mg a day and a prednisone taper currently down to 4 mg a day. She also takes Lomotil, Imodium and Colestid. She was recently  unable to complete MRI enterography because of nausea. We ordered the MRI to look for Crohn's disease in  distal small bowel as suggested by  [email or web address]   Past Medical History  Diagnosis Date  . Crohn's disease   . Depression   . Personal history of other diseases of digestive system   . Hiatal hernia   . Adenomatous colon polyp   . GERD (gastroesophageal reflux disease)   . Vitamin B12 deficiency   . SBO (small bowel obstruction)     partial secondary to crohns stricture  . Psoriasis   . Gastritis   . Abdominal pain   . Family history of malignant neoplasm of gastrointestinal tract    Past Surgical History  Procedure Date  . Partial hysterectomy   . Ileocecectomy 10/2008  . Carpal tunnel release     right  . Anal fissure repair   . Cholecystectomy   . Colon surgery     lap assisted ileocecectomy for crohns  . Colon surgery     right hemicolectomy for stricture    reports that she quit smoking about 27 years ago. Her smoking use included Cigarettes. She has a 10 pack-year smoking history. She has never used smokeless tobacco. She reports that she does not drink alcohol or use illicit drugs. family history includes Breast cancer in her paternal grandmother; Colon cancer in her mother; and Colon polyps in her brother, father, and mother. Allergies  Allergen Reactions  . Codeine Hives and Nausea And Vomiting        Review of Systems: Positive for  chronic nausea. Negative for dysphagia. Positive for heartburn  The remainder of the 10 point ROS is negative except as outlined in H&P   Physical Exam: General appearance  Well developed, in no distress. Appears cushingoid Eyes- non icteric. HEENT nontraumatic, normocephalic. Mouth no lesions, tongue papillated, cheilosis present. Neck supple without adenopathy, thyroid not enlarged, no carotid bruits, no JVD. Lungs Clear to auscultation bilaterally. Cor normal S1, normal S2, regular rhythm, no murmur,  quiet precordium. Abdomen: Protuberant abdomen diffusely tender on light touch mostly in the left middle and lower quadrant. Somewhat high-pitched bowel sounds. No rebound, no ascites Rectal: Not done Extremities no pedal edema. Skin no lesions. Neurological alert and oriented x 3. Psychological normal mood and affect.  Assessment and Plan:  Crohn's disease of the distal small bowel currently on a prednisone taper. Continues to have abdominal pain, increased gastroesophageal reflux and diarrhea. She will continue on  the above medications as well as a prednisone taper of 1 mg every month.She will remain. On Entecort. We will increase her Prilosec 20 mg to twice a day and reschedule her MRI enterography with premedication with Phenergan 4 hours and 2 hours prior to the enterography   05/20/2011 [name]

## 2011-05-23 ENCOUNTER — Other Ambulatory Visit: Payer: Self-pay | Admitting: Internal Medicine

## 2011-05-24 ENCOUNTER — Other Ambulatory Visit: Payer: Self-pay | Admitting: Internal Medicine

## 2011-05-24 ENCOUNTER — Ambulatory Visit (HOSPITAL_COMMUNITY)
Admission: RE | Admit: 2011-05-24 | Discharge: 2011-05-24 | Disposition: A | Discharge status: Home / Self Care | Payer: PRIVATE HEALTH INSURANCE | Source: Ambulatory Visit | Attending: Internal Medicine | Admitting: Internal Medicine

## 2011-05-24 ENCOUNTER — Encounter: Payer: Self-pay | Admitting: Internal Medicine

## 2011-05-24 ENCOUNTER — Other Ambulatory Visit (HOSPITAL_COMMUNITY): Payer: PRIVATE HEALTH INSURANCE

## 2011-05-24 DIAGNOSIS — K5090 Crohn's disease, unspecified, without complications: Secondary | ICD-10-CM

## 2011-05-24 DIAGNOSIS — R109 Unspecified abdominal pain: Secondary | ICD-10-CM | POA: Insufficient documentation

## 2011-05-24 LAB — CREATININE, SERUM: Creatinine, Ser: 0.69 mg/dL (ref 0.50–1.10)

## 2011-05-31 ENCOUNTER — Ambulatory Visit (HOSPITAL_COMMUNITY): Admission: RE | Admit: 2011-05-31 | Payer: PRIVATE HEALTH INSURANCE | Source: Ambulatory Visit

## 2011-05-31 ENCOUNTER — Other Ambulatory Visit: Payer: Self-pay | Admitting: Internal Medicine

## 2011-05-31 ENCOUNTER — Ambulatory Visit (HOSPITAL_COMMUNITY)
Admission: RE | Admit: 2011-05-31 | Discharge: 2011-05-31 | Disposition: A | Discharge status: Home / Self Care | Payer: PRIVATE HEALTH INSURANCE | Source: Ambulatory Visit | Attending: Internal Medicine | Admitting: Internal Medicine

## 2011-05-31 DIAGNOSIS — K5090 Crohn's disease, unspecified, without complications: Secondary | ICD-10-CM

## 2011-05-31 MED ORDER — LORAZEPAM 2 MG/ML IJ SOLN
1.0000 mg | Freq: Once | INTRAMUSCULAR | Status: AC
  Administered 2011-05-31: 1 mg via INTRAVENOUS

## 2011-05-31 MED ORDER — PROMETHAZINE HCL 25 MG/ML IJ SOLN
25.0000 mg | Freq: Once | INTRAMUSCULAR | Status: AC
  Administered 2011-05-31: 25 mg via INTRAVENOUS

## 2011-05-31 MED ORDER — GADOBENATE DIMEGLUMINE 529 MG/ML IV SOLN
20.0000 mL | Freq: Once | INTRAVENOUS | Status: AC | PRN
  Administered 2011-05-31: 20 mL via INTRAVENOUS

## 2011-05-31 MED ORDER — LORAZEPAM 2 MG/ML IJ SOLN
INTRAMUSCULAR | Status: AC
  Filled 2011-05-31: qty 1

## 2011-05-31 MED ORDER — PROMETHAZINE HCL 25 MG/ML IJ SOLN
INTRAMUSCULAR | Status: AC
  Filled 2011-05-31: qty 1

## 2011-06-01 ENCOUNTER — Other Ambulatory Visit: Payer: Self-pay | Admitting: *Deleted

## 2011-06-01 DIAGNOSIS — K5090 Crohn's disease, unspecified, without complications: Secondary | ICD-10-CM

## 2011-06-01 DIAGNOSIS — D849 Immunodeficiency, unspecified: Secondary | ICD-10-CM

## 2011-06-01 DIAGNOSIS — K5000 Crohn's disease of small intestine without complications: Secondary | ICD-10-CM

## 2011-06-01 DIAGNOSIS — T8189XA Other complications of procedures, not elsewhere classified, initial encounter: Secondary | ICD-10-CM

## 2011-06-01 DIAGNOSIS — K648 Other hemorrhoids: Secondary | ICD-10-CM

## 2011-06-01 DIAGNOSIS — R1033 Periumbilical pain: Secondary | ICD-10-CM

## 2011-06-01 MED ORDER — HYDROCODONE-ACETAMINOPHEN 10-325 MG PO TABS: 1.0000 | ORAL_TABLET | Freq: Four times a day (QID) | ORAL | Status: DC | PRN

## 2011-06-03 ENCOUNTER — Encounter (HOSPITAL_COMMUNITY)
Admission: RE | Admit: 2011-06-03 | Discharge: 2011-06-03 | Disposition: A | Discharge status: Home / Self Care | Payer: PRIVATE HEALTH INSURANCE | Source: Ambulatory Visit | Attending: Internal Medicine | Admitting: Internal Medicine

## 2011-06-03 ENCOUNTER — Encounter (HOSPITAL_COMMUNITY): Payer: Self-pay

## 2011-06-03 DIAGNOSIS — Z79899 Other long term (current) drug therapy: Secondary | ICD-10-CM | POA: Insufficient documentation

## 2011-06-03 DIAGNOSIS — K5090 Crohn's disease, unspecified, without complications: Secondary | ICD-10-CM | POA: Insufficient documentation

## 2011-06-03 DIAGNOSIS — Z9049 Acquired absence of other specified parts of digestive tract: Secondary | ICD-10-CM | POA: Insufficient documentation

## 2011-06-03 MED ORDER — SODIUM CHLORIDE 0.9 % IV SOLN
INTRAVENOUS | Status: AC
  Administered 2011-06-03: 10:00:00 via INTRAVENOUS

## 2011-06-03 MED ORDER — DIPHENHYDRAMINE HCL 25 MG PO CAPS
25.0000 mg | ORAL_CAPSULE | ORAL | Status: AC
  Administered 2011-06-03: 50 mg via ORAL

## 2011-06-03 MED ADMIN — Acetaminophen Tab 325 MG: 650 mg | ORAL | NDC 63739044001

## 2011-06-03 MED ADMIN — INFLIXIMAB INFUSION: 1000 mg | INTRAVENOUS | NDC 57894003001

## 2011-06-03 MED FILL — Infliximab For IV Inj 100 MG: INTRAVENOUS | Qty: 100 | Status: AC

## 2011-06-03 NOTE — Discharge Instructions (Signed)
Infliximab injection What is this medicine? INFLIXIMAB (in [place] i mab) is used to treat Crohn's disease and ulcerative colitis. It is also used to treat ankylosing spondylitis, psoriasis, and some forms of arthritis. This medicine may be used for other purposes; ask your health care provider or pharmacist if you have questions. What should I tell my health care provider before I take this medicine? They need to know if you have any of these conditions: -diabetes -exposure to tuberculosis -heart failure -hepatitis or liver disease -immune system problems -infection -lung or breathing disease, like COPD -multiple sclerosis -current or past resident of [place] or [place] -seizure disorder -an unusual or allergic reaction to infliximab, mouse proteins, other medicines, foods, dyes, or preservatives -pregnant or trying to get pregnant -breast-feeding How should I use this medicine? This medicine is for injection into a vein. It is usually given by a health care professional in a hospital or clinic setting. A special MedGuide will be given to you by the pharmacist with each prescription and refill. Be sure to read this information carefully each time. Talk to your pediatrician regarding the use of this medicine in children. Special care may be needed. Overdosage: If you think you have taken too much of this medicine contact a poison control center or emergency room at once. NOTE: This medicine is only for you. Do not share this medicine with others. What if I miss a dose? It is important not to miss your dose. Call your doctor or health care professional if you are unable to keep an appointment. What may interact with this medicine? Do not take this medicine with any of the following medications: -anakinra -rilonacept This medicine may also interact with the following medications: -vaccines This list may not describe all possible interactions. Give your health care provider  a list of all the medicines, herbs, non-prescription drugs, or dietary supplements you use. Also tell them if you smoke, drink alcohol, or use illegal drugs. Some items may interact with your medicine. What should I watch for while using this medicine? Visit your doctor or health care professional for regular checks on your progress. If you get a cold or other infection while receiving this medicine, call your doctor or health care professional. Do not treat yourself. This medicine may decrease your body's ability to fight infections. Before beginning therapy, your doctor may do a test to see if you have been exposed to tuberculosis. This medicine may make the symptoms of heart failure worse in some patients. If you notice symptoms such as increased shortness of breath or swelling of the ankles or legs, contact your health care provider right away. If you are going to have surgery or dental work, tell your health care professional or dentist that you have received this medicine. If you take this medicine for plaque psoriasis, stay out of the sun. If you cannot avoid being in the sun, wear protective clothing and use sunscreen. Do not use sun lamps or tanning beds/booths. What side effects may I notice from receiving this medicine? Side effects that you should report to your doctor or health care professional as soon as possible: -allergic reactions like skin rash, itching or hives, swelling of the face, lips, or tongue -chest pain -fever or chills, usually related to the infusion -muscle or joint pain -red, scaly patches or raised bumps on the skin -signs of infection - fever or chills, cough, sore throat, pain or difficulty passing urine -swollen lymph nodes in the neck, underarm,  or groin areas -unexplained weight loss -unusual bleeding or bruising -unusually weak or tired -yellowing of the eyes or skin Side effects that usually do not require medical attention (report to your doctor or health  care professional if they continue or are bothersome): -headache -heartburn or stomach pain -nausea, vomiting This list may not describe all possible side effects. Call your doctor for medical advice about side effects. You may report side effects to FDA at 1-800-FDA-1088. Where should I keep my medicine? This drug is given in a hospital or clinic and will not be stored at home. NOTE: This sheet is a summary. It may not cover all possible information. If you have questions about this medicine, talk to your doctor, pharmacist, or health care provider.  2012, Elsevier/Gold Standard. (10/10/2007 10:26:02 AM)

## 2011-06-09 ENCOUNTER — Telehealth: Payer: Self-pay | Admitting: Internal Medicine

## 2011-06-09 NOTE — Telephone Encounter (Signed)
Patient reports that she has had diarrhea 16 episodes today and she reports she is unable to stop vomiting.  She has tried phenergan orally and suppository and zofran .  She also reports taking lomotil and colestid , despite both she is having uncontrollable diarrhea.  She reports her symptoms started after having MRI last week. Discussed with Tye Savoy RNP patient needs eval at ER for fluids and treatment.

## 2011-06-09 NOTE — Telephone Encounter (Signed)
I agree with pt being evaluated in ED

## 2011-06-09 NOTE — Telephone Encounter (Signed)
Patient is advised of Tye Savoy RNP's recommendations

## 2011-06-10 NOTE — Telephone Encounter (Signed)
It doesn't appear she went to the ER as advised.  I see no ER encounters.

## 2011-06-14 ENCOUNTER — Other Ambulatory Visit: Payer: Self-pay | Admitting: Internal Medicine

## 2011-06-14 NOTE — Telephone Encounter (Signed)
Per Dr Olevia Perches, patient may have more fluconazole. Rx sent.

## 2011-07-04 ENCOUNTER — Other Ambulatory Visit: Payer: Self-pay | Admitting: Internal Medicine

## 2011-07-06 ENCOUNTER — Encounter: Payer: Self-pay | Admitting: Internal Medicine

## 2011-07-13 ENCOUNTER — Encounter: Payer: Self-pay | Admitting: Internal Medicine

## 2011-07-13 ENCOUNTER — Other Ambulatory Visit (INDEPENDENT_AMBULATORY_CARE_PROVIDER_SITE_OTHER): Payer: PRIVATE HEALTH INSURANCE

## 2011-07-13 ENCOUNTER — Ambulatory Visit (INDEPENDENT_AMBULATORY_CARE_PROVIDER_SITE_OTHER): Payer: PRIVATE HEALTH INSURANCE | Admitting: Internal Medicine

## 2011-07-13 VITALS — BP 162/94 | HR 100 | Wt 216.4 lb

## 2011-07-13 DIAGNOSIS — K5090 Crohn's disease, unspecified, without complications: Secondary | ICD-10-CM

## 2011-07-13 DIAGNOSIS — T8189XA Other complications of procedures, not elsewhere classified, initial encounter: Secondary | ICD-10-CM

## 2011-07-13 DIAGNOSIS — Z7901 Long term (current) use of anticoagulants: Secondary | ICD-10-CM

## 2011-07-13 DIAGNOSIS — D849 Immunodeficiency, unspecified: Secondary | ICD-10-CM

## 2011-07-13 DIAGNOSIS — R112 Nausea with vomiting, unspecified: Secondary | ICD-10-CM

## 2011-07-13 DIAGNOSIS — R1033 Periumbilical pain: Secondary | ICD-10-CM

## 2011-07-13 DIAGNOSIS — K648 Other hemorrhoids: Secondary | ICD-10-CM

## 2011-07-13 DIAGNOSIS — K5000 Crohn's disease of small intestine without complications: Secondary | ICD-10-CM

## 2011-07-13 LAB — RENAL FUNCTION PANEL
Albumin: 4 g/dL (ref 3.5–5.2)
CO2: 30 mEq/L (ref 19–32)
Calcium: 9.6 mg/dL (ref 8.4–10.5)
Creatinine, Ser: 0.7 mg/dL (ref 0.4–1.2)
Glucose, Bld: 99 mg/dL (ref 70–99)

## 2011-07-13 MED ORDER — PROMETHAZINE HCL 25 MG PO TABS: 25.0000 mg | ORAL_TABLET | Freq: Four times a day (QID) | ORAL | Status: DC | PRN

## 2011-07-13 MED ORDER — AZATHIOPRINE 50 MG PO TABS: ORAL_TABLET | ORAL | Status: DC

## 2011-07-13 MED ORDER — ALIGN 4 MG PO CAPS: 1.0000 | ORAL_CAPSULE | Freq: Every day | ORAL | Status: DC

## 2011-07-13 NOTE — Patient Instructions (Signed)
We have sent the following medications to your pharmacy for you to pick up at your convenience: Azathioprine Phenergan We have given you samples of Align. This puts good bacteria back into your colon. You should take 1 capsule by mouth once daily. If this works well for you, it can be purchased over the counter. We have given you your B12 injection today. Please schedule your next injection 1 month from now. Your physician has requested that you go to the basement for the following lab work before leaving today: Renal Function, Magnesium CC: Dr Gerrit Heck

## 2011-07-13 NOTE — Progress Notes (Signed)
[name] 1960/09/20 MRN [id number]  History of Present Illness:  This is a 51 year old white female with Crohn's colitis and ileitis. She had a normal CT enterography 6 weeks ago. It was done with a nasogastric tube because she was unable to keep the oral contrast down. The study flared up her diarrhea, abdominal pain and vomiting. She has not been well since. She has had 14 bowel movements since 2 AM this morning. She has taken Lomotil and Imodium as well as Colestid. She is unable to take paregoric because of nausea. Despite the diarrhea and nausea, her weight has increased to 216 pounds. We have been tapering her prednisone and she is currently down to 3 mg a day. She is still  on Entocort 6 mg a day. She denies fever. There has been no rectal bleeding. She has had cramps at night. Her last bowel resection was in 2008. Her Remicade infusions remain at 10 mg/kg  every 6 weeks. She also takes Imuran 100 mg daily and prednisone 3 mg daily.   Past Medical History  Diagnosis Date  . Crohn's disease   . Depression   . Personal history of other diseases of digestive system   . Hiatal hernia   . Adenomatous colon polyp   . GERD (gastroesophageal reflux disease)   . Vitamin B12 deficiency   . SBO (small bowel obstruction)     partial secondary to crohns stricture  . Psoriasis   . Gastritis   . Abdominal pain   . Family history of malignant neoplasm of gastrointestinal tract    Past Surgical History  Procedure Date  . Partial hysterectomy   . Ileocecectomy 10/2008  . Carpal tunnel release     right  . Anal fissure repair   . Cholecystectomy   . Colon surgery     lap assisted ileocecectomy for crohns  . Colon surgery     right hemicolectomy for stricture    reports that she quit smoking about 27 years ago. Her smoking use included Cigarettes. She has a 10 pack-year smoking history. She has never used smokeless tobacco. She reports that she does not drink alcohol or use illicit  drugs. family history includes Breast cancer in her paternal grandmother; Colon cancer in her mother; and Colon polyps in her brother, father, and mother. Allergies  Allergen Reactions  . Codeine Hives and Nausea And Vomiting        Review of Systems: No she ate today. Diarrhea. Weight gain  The remainder of the 10 point ROS is negative except as outlined in H&P   Physical Exam: General appearance  Well developed, in no distress. Cushingoid-appearing Eyes- non icteric. HEENT nontraumatic, normocephalic. Mouth no lesions, tongue papillated, no cheilosis. Neck supple without adenopathy, thyroid not enlarged, no carotid bruits, no JVD. Lungs Clear to auscultation bilaterally. Cor normal S1, normal S2, regular rhythm, no murmur,  quiet precordium. Abdomen: Protuberant abdomen very tender diffusely with normal active bowel sounds. No palpable mass. Rectal: Very tender anal canal with normal rectal sphincter tone. Small amount of liquid trace Hemoccult-positive stool. Extremities no pedal edema. Skin no lesions. Neurological alert and oriented x 3. Psychological normal mood and affect.  Assessment and Plan:  Problem #1 Crohn's disease of the small bowel and the colon. Patient is status post prior terminal ileal resection x 2 with ongoing severe diarrhea and nausea. Despite symptomatology, her CT enterography was normal. There was no evidence of active Crohn's disease. She is on a prednisone taper 1  mg monthly. We will obtain a magnesium and potassium levels today. We will also refill her Phenergan. I have given her samples of probiotics for bacterial overgrowth. I will see her in 8 weeks. She will also get a B12 shot today.   07/13/2011 [name]

## 2011-07-14 ENCOUNTER — Other Ambulatory Visit: Payer: Self-pay | Admitting: *Deleted

## 2011-07-14 DIAGNOSIS — K5090 Crohn's disease, unspecified, without complications: Secondary | ICD-10-CM

## 2011-07-14 DIAGNOSIS — D849 Immunodeficiency, unspecified: Secondary | ICD-10-CM

## 2011-07-14 DIAGNOSIS — R1033 Periumbilical pain: Secondary | ICD-10-CM

## 2011-07-14 DIAGNOSIS — K5000 Crohn's disease of small intestine without complications: Secondary | ICD-10-CM

## 2011-07-14 DIAGNOSIS — T8189XA Other complications of procedures, not elsewhere classified, initial encounter: Secondary | ICD-10-CM

## 2011-07-14 DIAGNOSIS — K648 Other hemorrhoids: Secondary | ICD-10-CM

## 2011-07-15 ENCOUNTER — Encounter (HOSPITAL_COMMUNITY): Payer: Self-pay

## 2011-07-15 ENCOUNTER — Encounter (HOSPITAL_COMMUNITY)
Admission: RE | Admit: 2011-07-15 | Discharge: 2011-07-15 | Disposition: A | Discharge status: Home / Self Care | Payer: PRIVATE HEALTH INSURANCE | Source: Ambulatory Visit | Attending: Internal Medicine | Admitting: Internal Medicine

## 2011-07-15 ENCOUNTER — Other Ambulatory Visit: Payer: Self-pay | Admitting: Internal Medicine

## 2011-07-15 VITALS — BP 156/89 | HR 79 | Temp 98.0°F | Resp 18 | Wt 216.2 lb

## 2011-07-15 DIAGNOSIS — K5000 Crohn's disease of small intestine without complications: Secondary | ICD-10-CM

## 2011-07-15 DIAGNOSIS — D849 Immunodeficiency, unspecified: Secondary | ICD-10-CM | POA: Insufficient documentation

## 2011-07-15 DIAGNOSIS — K5090 Crohn's disease, unspecified, without complications: Secondary | ICD-10-CM | POA: Insufficient documentation

## 2011-07-15 DIAGNOSIS — R1033 Periumbilical pain: Secondary | ICD-10-CM | POA: Insufficient documentation

## 2011-07-15 DIAGNOSIS — K648 Other hemorrhoids: Secondary | ICD-10-CM | POA: Insufficient documentation

## 2011-07-15 DIAGNOSIS — T8189XA Other complications of procedures, not elsewhere classified, initial encounter: Secondary | ICD-10-CM | POA: Insufficient documentation

## 2011-07-15 MED ORDER — ACETAMINOPHEN 325 MG PO TABS
650.0000 mg | ORAL_TABLET | ORAL | Status: DC
  Administered 2011-07-15: 650 mg via ORAL

## 2011-07-15 MED ORDER — ACETAMINOPHEN 325 MG PO TABS
ORAL_TABLET | ORAL | Status: AC
  Administered 2011-07-15: 650 mg via ORAL
  Filled 2011-07-15: qty 2

## 2011-07-15 MED ORDER — SODIUM CHLORIDE 0.9 % IV SOLN
10.0000 mg/kg | INTRAVENOUS | Status: DC
  Administered 2011-07-15: 1000 mg via INTRAVENOUS
  Filled 2011-07-15: qty 100

## 2011-07-15 MED ORDER — INFLIXIMAB 100 MG IV SOLR: 10.0000 mg/kg | INTRAVENOUS | Status: DC

## 2011-07-15 MED ORDER — DIPHENHYDRAMINE HCL 25 MG PO CAPS
ORAL_CAPSULE | ORAL | Status: AC
  Administered 2011-07-15: 25 mg via ORAL
  Filled 2011-07-15: qty 1

## 2011-07-15 MED ORDER — SODIUM CHLORIDE 0.9 % IV SOLN
INTRAVENOUS | Status: AC
  Administered 2011-07-15: 10:00:00 via INTRAVENOUS

## 2011-07-15 MED ORDER — DIPHENHYDRAMINE HCL 25 MG PO TABS
25.0000 mg | ORAL_TABLET | ORAL | Status: DC
  Administered 2011-07-15: 25 mg via ORAL
  Filled 2011-07-15: qty 2

## 2011-07-15 NOTE — Discharge Instructions (Signed)
Crohn's Disease Crohn's disease is a long-term (chronic) soreness and redness (inflammation) of the intestines (bowel). It can affect any portion of the digestive tract, from the mouth to the anus. It can also cause problems outside the digestive tract. Crohn's disease is closely related to a disease called ulcerative colitis (together, these two diseases are called inflammatory bowel disease).  CAUSES  The cause of Crohn's disease is not known. One Link Snuffer is that, in an easily affected (susceptible) person, the immune system is triggered to attack the body's own digestive tissue. Crohn's disease runs in families. It seems to be more common in certain geographic areas and amongst certain races. There are no clear-cut dietary causes.  SYMPTOMS  Crohn's disease can cause many different symptoms since it can affect many different parts of the body. Symptoms include:  Fatigue.   Weight loss.   Chronic diarrhea, sometime bloody.   Abdominal pain and cramps.   Fever.   Ulcers or canker sores in the mouth or rectum.   Anemia (low red blood cells).   Arthritis, skin problems, and eye problems may occur.  Complications of Crohn's disease can include:  Series of holes (perforation) of the bowel.   Portions of the intestines sticking to each other (adhesions).   Obstruction of the bowel.   Fistula formation, typically in the rectal area but also in other areas. A fistula is an opening between the bowels and the outside, or between the bowels and another organ.   A painful crack in the mucous membrane of the anus (rectal fissure).  DIAGNOSIS  Your caregiver may suspect Crohn's disease based on your symptoms and an exam. Blood tests may confirm that there is a problem. You may be asked to submit a stool specimen for examination. X-rays and CT scans may be necessary. Ultimately, the diagnosis is usually made after a procedure that uses a flexible tube that is inserted via your mouth or your anus.  This is done under sedation and is called either an upper endoscopy or colonoscopy. With these tests, the specialist can take tiny tissue samples and remove them from the inside of the bowel (biopsy). Examination of this biopsy tissue under a microscope can reveal Crohn's disease as the cause of your symptoms. Due to the many different forms that Crohn's disease can take, symptoms may be present for several years before a diagnosis is made. HOME CARE INSTRUCTIONS   There is no cure for Crohn's disease. The best treatment is frequent checkups with your caregiver.   Symptoms such as diarrhea can be controlled with medications. Avoid foods that have a laxative effect such as fresh fruit, vegetables and dairy products. During flare ups, you can rest your bowel by refraining from solid foods. Drink clear liquids frequently during the day (electrolyte or re-hydrating fluids are best. Your caregiver can help you with suggestions). Drink often to prevent loss of body fluids (dehydration). When diarrhea has cleared, eat small meals and more frequently. Avoid food additives and stimulants such as caffeine (coffee, tea, or chocolate). Enzyme supplements may help if you develop intolerance to a sugar in dairy products (lactose). Ask your caregiver or dietitian about specific dietary instructions.   Try to maintain a positive attitude. Learn relaxation techniques such as self hypnosis, mental imaging, and muscle relaxation.   If possible, avoid stresses which can aggravate your condition.   Exercise regularly.   Follow your diet.   Always get plenty of rest.  SEEK MEDICAL CARE IF:   Your symptoms  fail to improve after a week or two of new treatment.   You experience continued weight loss.   You have ongoing crampy digestion or loose bowels.   You develop a new skin rash, skin sores, or eye problems.  SEEK IMMEDIATE MEDICAL CARE IF:   You have worsening of your symptoms or develop new symptoms.   You  have a fever.   You develop bloody diarrhea.   You develop severe abdominal pain.  MAKE SURE YOU:   Understand these instructions.   Will watch your condition.   Will get help right away if you are not doing well or get worse.  Document Released: 12/01/2004 Document Revised: 02/10/2011 Document Reviewed: 10/30/2006 [facility] Patient Information 2012 [place].

## 2011-08-19 ENCOUNTER — Other Ambulatory Visit: Payer: Self-pay | Admitting: Internal Medicine

## 2011-08-23 ENCOUNTER — Telehealth: Payer: Self-pay | Admitting: Internal Medicine

## 2011-08-23 NOTE — Telephone Encounter (Signed)
Please go ahead with the Remicade infusion this week

## 2011-08-23 NOTE — Telephone Encounter (Signed)
Patient notified of Dr. Nichola Sizer recommendation.

## 2011-08-23 NOTE — Telephone Encounter (Signed)
Patient calling to report she has been on Cipro for a bladder infection for 10 days. She will finish the Cipro tomorrow. She is due for Remicade on Friday and wants to know if it is okay for her to get it. Please, advise.

## 2011-08-24 ENCOUNTER — Other Ambulatory Visit: Payer: Self-pay | Admitting: Internal Medicine

## 2011-08-25 ENCOUNTER — Other Ambulatory Visit (HOSPITAL_COMMUNITY): Payer: Self-pay | Admitting: *Deleted

## 2011-08-26 ENCOUNTER — Encounter (HOSPITAL_COMMUNITY): Payer: Self-pay

## 2011-08-26 ENCOUNTER — Encounter (HOSPITAL_COMMUNITY)
Admission: RE | Admit: 2011-08-26 | Discharge: 2011-08-26 | Disposition: A | Discharge status: Home / Self Care | Payer: PRIVATE HEALTH INSURANCE | Source: Ambulatory Visit | Attending: Internal Medicine | Admitting: Internal Medicine

## 2011-08-26 VITALS — BP 141/77 | HR 74 | Temp 98.4°F | Resp 16 | Wt 207.0 lb

## 2011-08-26 DIAGNOSIS — K648 Other hemorrhoids: Secondary | ICD-10-CM | POA: Insufficient documentation

## 2011-08-26 DIAGNOSIS — R1033 Periumbilical pain: Secondary | ICD-10-CM | POA: Insufficient documentation

## 2011-08-26 DIAGNOSIS — D849 Immunodeficiency, unspecified: Secondary | ICD-10-CM

## 2011-08-26 DIAGNOSIS — K5090 Crohn's disease, unspecified, without complications: Secondary | ICD-10-CM | POA: Insufficient documentation

## 2011-08-26 DIAGNOSIS — T8189XA Other complications of procedures, not elsewhere classified, initial encounter: Secondary | ICD-10-CM | POA: Insufficient documentation

## 2011-08-26 DIAGNOSIS — K5000 Crohn's disease of small intestine without complications: Secondary | ICD-10-CM | POA: Insufficient documentation

## 2011-08-26 MED ORDER — ACETAMINOPHEN 325 MG PO TABS
650.0000 mg | ORAL_TABLET | ORAL | Status: DC
Start: 2011-08-26 — End: 2011-08-27

## 2011-08-26 MED ORDER — SODIUM CHLORIDE 0.9 % IV SOLN
1000.0000 mg | INTRAVENOUS | Status: DC
  Administered 2011-08-26: 1000 mg via INTRAVENOUS
  Filled 2011-08-26: qty 100

## 2011-08-26 MED ORDER — SODIUM CHLORIDE 0.9 % IV SOLN
INTRAVENOUS | Status: DC
  Administered 2011-08-26: 10:00:00 via INTRAVENOUS

## 2011-08-26 MED ORDER — DIPHENHYDRAMINE HCL 25 MG PO CAPS
ORAL_CAPSULE | ORAL | Status: AC
  Administered 2011-08-26: 50 mg
  Filled 2011-08-26: qty 2

## 2011-08-26 MED ORDER — ACETAMINOPHEN 325 MG PO TABS
ORAL_TABLET | ORAL | Status: AC
  Administered 2011-08-26: 650 mg
  Filled 2011-08-26: qty 2

## 2011-08-26 MED ORDER — DIPHENHYDRAMINE HCL 25 MG PO TABS
25.0000 mg | ORAL_TABLET | ORAL | Status: DC
  Filled 2011-08-26: qty 2

## 2011-08-26 NOTE — Discharge Instructions (Signed)
Call your doctor for any problems or question. See other sheet for appointments.  .Infliximab injection What is this medicine? INFLIXIMAB (in [place] i mab) is used to treat Crohn's disease and ulcerative colitis. It is also used to treat ankylosing spondylitis, psoriasis, and some forms of arthritis. This medicine may be used for other purposes; ask your health care provider or pharmacist if you have questions. What should I tell my health care provider before I take this medicine? They need to know if you have any of these conditions: -diabetes -exposure to tuberculosis -heart failure -hepatitis or liver disease -immune system problems -infection -lung or breathing disease, like COPD -multiple sclerosis -current or past resident of [place] or [place] -seizure disorder -an unusual or allergic reaction to infliximab, mouse proteins, other medicines, foods, dyes, or preservatives -pregnant or trying to get pregnant -breast-feeding How should I use this medicine? This medicine is for injection into a vein. It is usually given by a health care professional in a hospital or clinic setting. A special MedGuide will be given to you by the pharmacist with each prescription and refill. Be sure to read this information carefully each time. Talk to your pediatrician regarding the use of this medicine in children. Special care may be needed. Overdosage: If you think you have taken too much of this medicine contact a poison control center or emergency room at once. NOTE: This medicine is only for you. Do not share this medicine with others. What if I miss a dose? It is important not to miss your dose. Call your doctor or health care professional if you are unable to keep an appointment. What may interact with this medicine? Do not take this medicine with any of the following medications: -anakinra -rilonacept This medicine may also interact with the following  medications: -vaccines This list may not describe all possible interactions. Give your health care provider a list of all the medicines, herbs, non-prescription drugs, or dietary supplements you use. Also tell them if you smoke, drink alcohol, or use illegal drugs. Some items may interact with your medicine. What should I watch for while using this medicine? Visit your doctor or health care professional for regular checks on your progress. If you get a cold or other infection while receiving this medicine, call your doctor or health care professional. Do not treat yourself. This medicine may decrease your body's ability to fight infections. Before beginning therapy, your doctor may do a test to see if you have been exposed to tuberculosis. This medicine may make the symptoms of heart failure worse in some patients. If you notice symptoms such as increased shortness of breath or swelling of the ankles or legs, contact your health care provider right away. If you are going to have surgery or dental work, tell your health care professional or dentist that you have received this medicine. If you take this medicine for plaque psoriasis, stay out of the sun. If you cannot avoid being in the sun, wear protective clothing and use sunscreen. Do not use sun lamps or tanning beds/booths. What side effects may I notice from receiving this medicine? Side effects that you should report to your doctor or health care professional as soon as possible: -allergic reactions like skin rash, itching or hives, swelling of the face, lips, or tongue -chest pain -fever or chills, usually related to the infusion -muscle or joint pain -red, scaly patches or raised bumps on the skin -signs of infection - fever or chills, cough,  sore throat, pain or difficulty passing urine -swollen lymph nodes in the neck, underarm, or groin areas -unexplained weight loss -unusual bleeding or bruising -unusually weak or tired -yellowing of  the eyes or skin Side effects that usually do not require medical attention (report to your doctor or health care professional if they continue or are bothersome): -headache -heartburn or stomach pain -nausea, vomiting This list may not describe all possible side effects. Call your doctor for medical advice about side effects. You may report side effects to FDA at 1-800-FDA-1088. Where should I keep my medicine? This drug is given in a hospital or clinic and will not be stored at home. NOTE: This sheet is a summary. It may not cover all possible information. If you have questions about this medicine, talk to your doctor, pharmacist, or health care provider.  2012, Elsevier/[employer]. (10/10/2007 10:26:02 AM)

## 2011-09-09 ENCOUNTER — Ambulatory Visit (INDEPENDENT_AMBULATORY_CARE_PROVIDER_SITE_OTHER): Payer: PRIVATE HEALTH INSURANCE | Admitting: Internal Medicine

## 2011-09-09 ENCOUNTER — Encounter: Payer: Self-pay | Admitting: Internal Medicine

## 2011-09-09 VITALS — BP 130/80 | HR 80 | Ht 68.0 in | Wt 211.4 lb

## 2011-09-09 DIAGNOSIS — Z79899 Other long term (current) drug therapy: Secondary | ICD-10-CM

## 2011-09-09 DIAGNOSIS — D849 Immunodeficiency, unspecified: Secondary | ICD-10-CM

## 2011-09-09 DIAGNOSIS — K5090 Crohn's disease, unspecified, without complications: Secondary | ICD-10-CM

## 2011-09-09 NOTE — Progress Notes (Addendum)
[name] [birth year] MRN [id number]  History of Present Illness:  This is a 51 year old white female with Crohn's disease of the colon and ileum. A recent CT enterography was normal. She had a prior resection of the terminal ileum x 2. Her last appointment with [place] was on Jul 13, 2011. She has done reasonably well. Her weight is down to 211 pounds. She has continued her ednisone taper and is currently down to 2 mg a day for another week then 1 mg a day for 4 weeks. She has completed her Remicade infusions 10 mg/kgevery 6 weeks and Imuran 100 mg daily. Her diarrhea has been stable. She has occasional episodes of nausea and vomiting but no true obstruction. She has tried to eat corn on the cob successfully without getting sick. She is very happy about it. She needs refilld on Phenergan. She also is asking for something for nerves.   Past Medical History  Diagnosis Date  . Crohn's disease   . Depression   . Personal history of other diseases of digestive system   . Hiatal hernia   . Adenomatous colon polyp   . GERD (gastroesophageal reflux disease)   . Vitamin B12 deficiency   . SBO (small bowel obstruction)     partial secondary to crohns stricture  . Psoriasis   . Gastritis   . Abdominal pain   . Family history of malignant neoplasm of gastrointestinal tract    Past Surgical History  Procedure Date  . Partial hysterectomy   . Ileocecectomy 10/2008  . Carpal tunnel release     right  . Anal fissure repair   . Cholecystectomy   . Colon surgery     lap assisted ileocecectomy for crohns  . Colon surgery     right hemicolectomy for stricture    reports that she quit smoking about 27 years ago. Her smoking use included Cigarettes. She has a 10 pack-year smoking history. She has never used smokeless tobacco. She reports that she does not drink alcohol or use illicit drugs. family history includes Breast cancer in her paternal grandmother; Colon cancer in her mother; and Colon  polyps in her brother, father, and mother. Allergies  Allergen Reactions  . Codeine Hives and Nausea And Vomiting        Review of Systems: Denies heartburn, dysphagia odynophagia. Positive for abdominal pain  The remainder of the 10 point ROS is negative except as outlined in H&P   Physical Exam: General appearance  Well developed, in no distress. Eyes- non icteric. HEENT nontraumatic, normocephalic. Mouth no lesions, tongue papillated, no cheilosis. Neck supple without adenopathy, thyroid not enlarged, no carotid bruits, no JVD. Lungs Clear to auscultation bilaterally. Cor normal S1, normal S2, regular rhythm, no murmur,  quiet precordium. Abdomen: Diffusely tender with normal active bowel sounds. No distention. Most of the tenderness in epigastrium. Liver edge at costal margin. Rectal: Not done today. Extremities no pedal edema. Skin no lesions. A few ecchymoses. Psoriasis under control. Neurological alert and oriented x 3. Psychological normal mood and affect.  Assessment and Plan:  Problem #1 Crohn's ileitis colitis currently under reasonable control on multiple medications. She will need refills on Phenergan suppositories 25 mg. I will see her in 3 months. She will continue on her Imuran, Remicade and Prilosec.    09/09/2011 [name]

## 2011-09-09 NOTE — Patient Instructions (Addendum)
We have sent the following medications to your pharmacy for you to pick up at your convenience: Phenergan Suppositories Please follow up with Dr Olevia Perches in 3 months. CC: Dr Gerrit Heck

## 2011-09-10 ENCOUNTER — Encounter: Payer: Self-pay | Admitting: Internal Medicine

## 2011-09-13 ENCOUNTER — Other Ambulatory Visit: Payer: Self-pay | Admitting: Internal Medicine

## 2011-09-19 ENCOUNTER — Other Ambulatory Visit: Payer: Self-pay | Admitting: Internal Medicine

## 2011-09-20 ENCOUNTER — Other Ambulatory Visit: Payer: Self-pay | Admitting: Internal Medicine

## 2011-09-23 ENCOUNTER — Other Ambulatory Visit: Payer: Self-pay | Admitting: *Deleted

## 2011-09-23 ENCOUNTER — Telehealth: Payer: Self-pay | Admitting: Internal Medicine

## 2011-09-23 MED ORDER — PROMETHAZINE HCL 25 MG RE SUPP: 25.0000 mg | Freq: Four times a day (QID) | RECTAL | Status: DC | PRN

## 2011-09-23 MED ORDER — PROMETHAZINE HCL 25 MG RE SUPP: RECTAL | Status: DC

## 2011-09-23 NOTE — Telephone Encounter (Signed)
Please give her #30 Phenergan 25 mg supp, 2 refills,

## 2011-09-23 NOTE — Telephone Encounter (Signed)
Spoke with patient and told her Dr. Olevia Perches is increasing the number of Phenergan supp. Ordered for her instead of po Phenergan.

## 2011-09-23 NOTE — Telephone Encounter (Signed)
Spoke with patient's husband and she is having  Nonstop nausea, vomiting and diarrhea since 5 AM today. Denies bleeding or fever. She is c/o pain under her rib cage from the wretching. He states she has tried using a Phenergan suppository this AM but it would not stay in due to diarrhea. She is scheduled to see Dr. Rocky Link at 11 AM today. Patients husband instructed to keep this appointment or take patient to ED if she is unable to wait. Per patient's husband she is out of Phenergan 25 mg tablets. Per Dixon Boos CMA note patient is early on getting a refill(She was given #60 with 2 refills on 07/13/11) Is it okay to refill this early and/or should the # be increased on the refill. Please, advise.

## 2011-09-30 ENCOUNTER — Encounter (HOSPITAL_COMMUNITY): Payer: PRIVATE HEALTH INSURANCE

## 2011-09-30 ENCOUNTER — Other Ambulatory Visit: Payer: Self-pay | Admitting: Internal Medicine

## 2011-10-07 ENCOUNTER — Encounter (HOSPITAL_COMMUNITY): Payer: Self-pay

## 2011-10-07 ENCOUNTER — Ambulatory Visit (INDEPENDENT_AMBULATORY_CARE_PROVIDER_SITE_OTHER): Payer: PRIVATE HEALTH INSURANCE | Admitting: Internal Medicine

## 2011-10-07 ENCOUNTER — Encounter (HOSPITAL_COMMUNITY)
Admission: RE | Admit: 2011-10-07 | Discharge: 2011-10-07 | Disposition: A | Discharge status: Home / Self Care | Payer: PRIVATE HEALTH INSURANCE | Source: Ambulatory Visit | Attending: Internal Medicine | Admitting: Internal Medicine

## 2011-10-07 VITALS — BP 132/79 | HR 80 | Temp 97.9°F | Resp 18 | Ht 68.0 in | Wt 203.4 lb

## 2011-10-07 DIAGNOSIS — K648 Other hemorrhoids: Secondary | ICD-10-CM | POA: Insufficient documentation

## 2011-10-07 DIAGNOSIS — T8189XA Other complications of procedures, not elsewhere classified, initial encounter: Secondary | ICD-10-CM | POA: Insufficient documentation

## 2011-10-07 DIAGNOSIS — K5090 Crohn's disease, unspecified, without complications: Secondary | ICD-10-CM

## 2011-10-07 DIAGNOSIS — D849 Immunodeficiency, unspecified: Secondary | ICD-10-CM | POA: Insufficient documentation

## 2011-10-07 DIAGNOSIS — K5000 Crohn's disease of small intestine without complications: Secondary | ICD-10-CM | POA: Insufficient documentation

## 2011-10-07 DIAGNOSIS — R1033 Periumbilical pain: Secondary | ICD-10-CM | POA: Insufficient documentation

## 2011-10-07 MED ORDER — ACETAMINOPHEN 325 MG PO TABS
650.0000 mg | ORAL_TABLET | ORAL | Status: AC
  Administered 2011-10-07: 650 mg via ORAL
  Filled 2011-10-07: qty 2

## 2011-10-07 MED ORDER — SODIUM CHLORIDE 0.9 % IV SOLN
INTRAVENOUS | Status: DC
  Administered 2011-10-07: 250 mL via INTRAVENOUS

## 2011-10-07 MED ORDER — DIPHENHYDRAMINE HCL 25 MG PO TABS
25.0000 mg | ORAL_TABLET | ORAL | Status: AC
  Administered 2011-10-07: 50 mg via ORAL
  Filled 2011-10-07 (×2): qty 2

## 2011-10-07 MED ORDER — SODIUM CHLORIDE 0.9 % IV SOLN
1000.0000 mg | INTRAVENOUS | Status: AC
  Administered 2011-10-07: 1000 mg via INTRAVENOUS
  Filled 2011-10-07: qty 100

## 2011-10-10 ENCOUNTER — Telehealth: Payer: Self-pay | Admitting: Internal Medicine

## 2011-10-10 NOTE — Telephone Encounter (Signed)
Patient is coming in for TB skin test and wanting to be seen for abdominal pain, nausea and increased diarrhea. States she has had an increase in diarrhea this weekend. Unable to schedule her today. Scheduled her tomorrow with Tye Savoy, NP at 9:30 AM.

## 2011-10-10 NOTE — Telephone Encounter (Signed)
I agree with her seeing [name]. We have been tapering her  Prednisone

## 2011-10-11 ENCOUNTER — Encounter: Payer: Self-pay | Admitting: Nurse Practitioner

## 2011-10-11 ENCOUNTER — Telehealth: Payer: Self-pay | Admitting: *Deleted

## 2011-10-11 ENCOUNTER — Ambulatory Visit (INDEPENDENT_AMBULATORY_CARE_PROVIDER_SITE_OTHER): Payer: PRIVATE HEALTH INSURANCE | Admitting: Nurse Practitioner

## 2011-10-11 ENCOUNTER — Other Ambulatory Visit (INDEPENDENT_AMBULATORY_CARE_PROVIDER_SITE_OTHER): Payer: PRIVATE HEALTH INSURANCE

## 2011-10-11 VITALS — BP 142/80 | HR 100 | Ht 69.0 in | Wt 202.4 lb

## 2011-10-11 DIAGNOSIS — K5090 Crohn's disease, unspecified, without complications: Secondary | ICD-10-CM

## 2011-10-11 DIAGNOSIS — R197 Diarrhea, unspecified: Secondary | ICD-10-CM

## 2011-10-11 DIAGNOSIS — R110 Nausea: Secondary | ICD-10-CM

## 2011-10-11 LAB — CBC WITH DIFFERENTIAL/PLATELET
Basophils Absolute: 0 10*3/uL (ref 0.0–0.1)
Hemoglobin: 13.9 g/dL (ref 12.0–15.0)
Lymphocytes Relative: 26.7 % (ref 12.0–46.0)
Monocytes Relative: 7.6 % (ref 3.0–12.0)
Neutro Abs: 5.5 10*3/uL (ref 1.4–7.7)
RBC: 4.28 Mil/uL (ref 3.87–5.11)
RDW: 12.9 % (ref 11.5–14.6)
WBC: 8.4 10*3/uL (ref 4.5–10.5)

## 2011-10-11 LAB — COMPREHENSIVE METABOLIC PANEL
ALT: 16 U/L (ref 0–35)
BUN: 11 mg/dL (ref 6–23)
CO2: 30 mEq/L (ref 19–32)
Calcium: 9.3 mg/dL (ref 8.4–10.5)
Chloride: 101 mEq/L (ref 96–112)
Creatinine, Ser: 0.6 mg/dL (ref 0.4–1.2)
GFR: 111.46 mL/min (ref >60.00)
Glucose, Bld: 108 mg/dL — ABNORMAL HIGH (ref 70–99)

## 2011-10-11 MED ORDER — HYDROCODONE-ACETAMINOPHEN 10-325 MG PO TABS: 1.0000 | ORAL_TABLET | ORAL | Status: DC | PRN

## 2011-10-11 MED ORDER — PROMETHAZINE HCL 25 MG PO TABS: 25.0000 mg | ORAL_TABLET | Freq: Four times a day (QID) | ORAL | Status: DC | PRN

## 2011-10-11 NOTE — Progress Notes (Signed)
[name] [id number] 08/23/1960   HISTORY OR PRESENT ILLNESS :  She is a 51 year old female followed by Dr. Olevia Perches for history of Crohn's disease of the colon and ileum, status post terminal ileal resection x2. Last seen July 5th of this year at which time she was doing well on a prednisone taper, Imuran, Entocort and Remicade.  She is down to 1 mg prednisone daily and down to Entocort 12m daily. Patient worked in for appointment today for diffuse abdominal pain, nausea and diarrhea. She has had the pain for about 6 weeks,  it is becoming progressively worse.  She is using Bentyl twice daily.Pain constant, worse with meals. Hydrocodone not working Her stools are a little more loose and frequent than normal and she has a little bit of blood at times when wiping. Her chronic nausea is worse lately despite Phenergan. Over the last 4 days she had had low grade temp of around 99. Basically her chronic abdominal pain, nausea and diarrhea are overall much worse over the last few weeks.  Late June she had a UTI, took Amoxicillin for 2 weeks. .  Current Medications, Allergies, Past Medical History, Past Surgical History, Family History and Social History were reviewed in [employer].   PHYSICAL EXAMINATION : General:  Well developed  female in no acute distress Head: Normocephalic and atraumatic Eyes:  sclerae anicteric,conjunctive pink. Ears: Normal auditory acuity Neck: Supple, no masses.  Lungs: Clear throughout to auscultation Heart: Regular rate and rhythm; no murmurs heard Abdomen: Soft, nondistended, diffuse tenderness with even the lightest palpation. No masses or hepatomegaly noted. Normal bowel sounds Musculoskeletal:  Symmetrical with no gross deformities  Skin: No lesions on visible extremities Extremities: No edema or deformities noted Neurological: Oriented x 4, grossly nonfocal Cervical Nodes:  No significant cervical adenopathy Psychological:   Alert and cooperative. Normal mood and affect  ASSESSMENT AND PLAN :  1. Crohn's disease, s/p resection with primary anastomosis  X2. She is getting Remicade Q 6 weeks. She is down to 642mof Entocort daily and prednisone is down to 71m72maily. Colonoscopy with biopsies negative for active disease November 2012. MR enterography and EGD March 2013 negative for active disease (and no granulomas on EGD with biopsies).  2. Acute on chronic nausea, vomiting, diarrhea and abdominal pain. Recent antibiotics for UTI, check stool studies to rule out C-diff. Will obtain basic labs. Will obtain CTscan as she has diffuse tenderness on exam, reports low grade temps. Hydrocodone not working over last few weeks. Will increase to 2 pills Q4 hours until cause of worsening symptoms can be sorted out.  Refill Phenergan. Will call patient with results. Drink plenty of fluids. Clear liquid diet until CTscan results obtained.   3. 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

## 2011-10-11 NOTE — Patient Instructions (Addendum)
Please go to the basement level to have your labs drawn.  We sent a refill on phenergan 25 mg and faxed a prescription for Hydrocodone-acetaminophen. We scheduled a CT scan for tomorrow 10-12-2011 at 9:00 Am .  You have been scheduled for a CT scan of the abdomen and pelvis at [facility] ([address]---this is in the same building as Press [facility]).   You are scheduled on 10-12-2011 at Wednesday. You should arrive 15 minutes prior to your appointment time for registration. Please follow the written instructions below on the day of your exam:  WARNING: IF YOU ARE ALLERGIC TO IODINE/X-RAY DYE, PLEASE NOTIFY RADIOLOGY IMMEDIATELY AT [phone]! YOU WILL BE GIVEN A 13 HOUR PREMEDICATION PREP.  1) Do not eat or drink anything after 5:00 Am  (4 hours prior to your test) 2) You have been given 2 bottles of oral contrast to drink. The solution may taste better if refrigerated, but do NOT add ice or any other liquid to this solution. Shake well before drinking.    Drink 1 bottle of contrast @7 :00 Am  (2 hours prior to your exam)  Drink 1 bottle of contrast @ 8:00 AM (1 hour prior to your exam)  You may take any medications as prescribed with a small amount of water except for the following: Metformin, Glucophage, Glucovance, Avandamet, Riomet, Fortamet, Actoplus Met, Janumet, Glumetza or Metaglip. The above medications must be held the day of the exam AND 48 hours after the exam.  The purpose of you drinking the oral contrast is to aid in the visualization of your intestinal tract. The contrast solution may cause some diarrhea. Before your exam is started, you will be given a small amount of fluid to drink. Depending on your individual set of symptoms, you may also receive an intravenous injection of x-ray contrast/dye. Plan on being at [facility] for 30 minutes or long, depending on the type of exam you are having performed.  If you have any questions regarding your exam or  if you need to reschedule, you may call the CT department at [phone] between the hours of 8:00 am and 5:00 pm, Monday-Friday.  ________________________________________________________________________

## 2011-10-11 NOTE — Telephone Encounter (Signed)
I called the pt to advise we had to change her appointment for the CT scan to Thursday 10-13-2011 at 2 PM.  Per Cora Daniels , our [place] precertification representative, said the insurance company is reviewing this.   They more than likely will approve this but we need to give this 24 hours until we hear from them.  The patient was fine with the change in date and time.

## 2011-10-12 ENCOUNTER — Telehealth: Payer: Self-pay | Admitting: Internal Medicine

## 2011-10-12 ENCOUNTER — Other Ambulatory Visit: Payer: PRIVATE HEALTH INSURANCE

## 2011-10-12 DIAGNOSIS — R110 Nausea: Secondary | ICD-10-CM | POA: Insufficient documentation

## 2011-10-12 NOTE — Progress Notes (Signed)
Reviewed and agree with management plan. Pricilla Riffle. Fuller Plan MD Marval Regal

## 2011-10-12 NOTE — Telephone Encounter (Signed)
Gave pharmacy verbal order. Patient advised.

## 2011-10-13 ENCOUNTER — Other Ambulatory Visit: Payer: PRIVATE HEALTH INSURANCE

## 2011-10-20 ENCOUNTER — Other Ambulatory Visit: Payer: Self-pay | Admitting: Internal Medicine

## 2011-11-08 ENCOUNTER — Other Ambulatory Visit: Payer: Self-pay | Admitting: Internal Medicine

## 2011-11-08 ENCOUNTER — Other Ambulatory Visit: Payer: Self-pay | Admitting: Nurse Practitioner

## 2011-11-11 ENCOUNTER — Other Ambulatory Visit: Payer: Self-pay | Admitting: *Deleted

## 2011-11-11 MED ORDER — HYDROCODONE-ACETAMINOPHEN 10-325 MG PO TABS: 1.0000 | ORAL_TABLET | ORAL | Status: DC | PRN

## 2011-11-11 NOTE — Telephone Encounter (Signed)
I faxed a refill and did the new RX under Amy Esterwood PA  Since [place] was not here.  Talked to [place] on the phone and she agreed to have Amy sign for this prescription, Norco, #40, with 0 refills.

## 2011-11-18 ENCOUNTER — Encounter (HOSPITAL_COMMUNITY)
Admission: RE | Admit: 2011-11-18 | Discharge: 2011-11-18 | Disposition: A | Discharge status: Home / Self Care | Payer: PRIVATE HEALTH INSURANCE | Source: Ambulatory Visit | Attending: Internal Medicine | Admitting: Internal Medicine

## 2011-11-18 ENCOUNTER — Encounter (HOSPITAL_COMMUNITY): Payer: Self-pay

## 2011-11-18 VITALS — BP 133/81 | HR 88 | Temp 98.5°F | Resp 18 | Ht 68.0 in | Wt 193.0 lb

## 2011-11-18 DIAGNOSIS — K5090 Crohn's disease, unspecified, without complications: Secondary | ICD-10-CM | POA: Insufficient documentation

## 2011-11-18 DIAGNOSIS — T8189XA Other complications of procedures, not elsewhere classified, initial encounter: Secondary | ICD-10-CM | POA: Insufficient documentation

## 2011-11-18 DIAGNOSIS — D849 Immunodeficiency, unspecified: Secondary | ICD-10-CM | POA: Insufficient documentation

## 2011-11-18 DIAGNOSIS — R1033 Periumbilical pain: Secondary | ICD-10-CM | POA: Insufficient documentation

## 2011-11-18 DIAGNOSIS — K5000 Crohn's disease of small intestine without complications: Secondary | ICD-10-CM | POA: Insufficient documentation

## 2011-11-18 DIAGNOSIS — K648 Other hemorrhoids: Secondary | ICD-10-CM

## 2011-11-18 MED ORDER — ACETAMINOPHEN 325 MG PO TABS
650.0000 mg | ORAL_TABLET | ORAL | Status: AC
  Administered 2011-11-18: 650 mg via ORAL
  Filled 2011-11-18: qty 2

## 2011-11-18 MED ORDER — SODIUM CHLORIDE 0.9 % IV SOLN
1000.0000 mg | INTRAVENOUS | Status: AC
  Administered 2011-11-18: 1000 mg via INTRAVENOUS
  Filled 2011-11-18: qty 100

## 2011-11-18 MED ORDER — SODIUM CHLORIDE 0.9 % IV SOLN
INTRAVENOUS | Status: AC
  Administered 2011-11-18: 250 mL via INTRAVENOUS

## 2011-11-18 MED ORDER — DIPHENHYDRAMINE HCL 25 MG PO TABS
25.0000 mg | ORAL_TABLET | ORAL | Status: AC
  Administered 2011-11-18: 25 mg via ORAL
  Filled 2011-11-18: qty 1
  Filled 2011-11-18: qty 2

## 2011-12-14 ENCOUNTER — Other Ambulatory Visit: Payer: Self-pay | Admitting: Physician Assistant

## 2011-12-14 ENCOUNTER — Other Ambulatory Visit: Payer: Self-pay | Admitting: Internal Medicine

## 2011-12-20 NOTE — Telephone Encounter (Signed)
rx sent

## 2011-12-20 NOTE — Telephone Encounter (Signed)
No from [facility] Stapleton-send to Dr. Olevia Perches

## 2011-12-29 ENCOUNTER — Other Ambulatory Visit: Payer: Self-pay | Admitting: Internal Medicine

## 2011-12-30 ENCOUNTER — Encounter (HOSPITAL_COMMUNITY): Payer: Self-pay

## 2011-12-30 ENCOUNTER — Encounter (HOSPITAL_COMMUNITY)
Admission: RE | Admit: 2011-12-30 | Discharge: 2011-12-30 | Disposition: A | Discharge status: Home / Self Care | Payer: PRIVATE HEALTH INSURANCE | Source: Ambulatory Visit | Attending: Internal Medicine | Admitting: Internal Medicine

## 2011-12-30 VITALS — BP 136/82 | HR 72 | Temp 98.8°F | Resp 18 | Wt 194.4 lb

## 2011-12-30 DIAGNOSIS — K5000 Crohn's disease of small intestine without complications: Secondary | ICD-10-CM | POA: Insufficient documentation

## 2011-12-30 DIAGNOSIS — T8189XA Other complications of procedures, not elsewhere classified, initial encounter: Secondary | ICD-10-CM | POA: Insufficient documentation

## 2011-12-30 DIAGNOSIS — K5090 Crohn's disease, unspecified, without complications: Secondary | ICD-10-CM

## 2011-12-30 DIAGNOSIS — D849 Immunodeficiency, unspecified: Secondary | ICD-10-CM | POA: Insufficient documentation

## 2011-12-30 DIAGNOSIS — R1033 Periumbilical pain: Secondary | ICD-10-CM | POA: Insufficient documentation

## 2011-12-30 DIAGNOSIS — K648 Other hemorrhoids: Secondary | ICD-10-CM | POA: Insufficient documentation

## 2011-12-30 MED ORDER — ACETAMINOPHEN 325 MG PO TABS
650.0000 mg | ORAL_TABLET | ORAL | Status: AC
  Administered 2011-12-30: 650 mg via ORAL
  Filled 2011-12-30: qty 2

## 2011-12-30 MED ORDER — DIPHENHYDRAMINE HCL 25 MG PO CAPS
25.0000 mg | ORAL_CAPSULE | ORAL | Status: AC
  Administered 2011-12-30: 50 mg via ORAL
  Filled 2011-12-30 (×2): qty 2

## 2011-12-30 MED ORDER — SODIUM CHLORIDE 0.9 % IV SOLN
INTRAVENOUS | Status: AC
  Administered 2011-12-30: 250 mL via INTRAVENOUS

## 2011-12-30 MED ORDER — SODIUM CHLORIDE 0.9 % IV SOLN
10.0000 mg/kg | INTRAVENOUS | Status: AC
  Administered 2011-12-30: 900 mg via INTRAVENOUS
  Filled 2011-12-30: qty 90

## 2012-01-17 ENCOUNTER — Ambulatory Visit (INDEPENDENT_AMBULATORY_CARE_PROVIDER_SITE_OTHER): Payer: PRIVATE HEALTH INSURANCE | Admitting: Internal Medicine

## 2012-01-17 ENCOUNTER — Other Ambulatory Visit (INDEPENDENT_AMBULATORY_CARE_PROVIDER_SITE_OTHER): Payer: PRIVATE HEALTH INSURANCE

## 2012-01-17 ENCOUNTER — Encounter: Payer: Self-pay | Admitting: Internal Medicine

## 2012-01-17 VITALS — BP 132/78 | HR 88 | Ht 68.0 in | Wt 198.8 lb

## 2012-01-17 DIAGNOSIS — K5090 Crohn's disease, unspecified, without complications: Secondary | ICD-10-CM

## 2012-01-17 DIAGNOSIS — Z79899 Other long term (current) drug therapy: Secondary | ICD-10-CM

## 2012-01-17 DIAGNOSIS — D849 Immunodeficiency, unspecified: Secondary | ICD-10-CM

## 2012-01-17 LAB — CBC WITH DIFFERENTIAL/PLATELET
Basophils Absolute: 0 10*3/uL (ref 0.0–0.1)
Eosinophils Absolute: 0 10*3/uL (ref 0.0–0.7)
HCT: 38.2 % (ref 36.0–46.0)
Lymphs Abs: 3 10*3/uL (ref 0.7–4.0)
MCHC: 33.5 g/dL (ref 30.0–36.0)
MCV: 96.5 fl (ref 78.0–100.0)
Monocytes Absolute: 0.7 10*3/uL (ref 0.1–1.0)
Monocytes Relative: 7.7 % (ref 3.0–12.0)
Platelets: 265 10*3/uL (ref 150.0–400.0)
RDW: 14.5 % (ref 11.5–14.6)

## 2012-01-17 MED ORDER — FLUCONAZOLE 100 MG PO TABS: 100.0000 mg | ORAL_TABLET | Freq: Every day | ORAL | Status: DC

## 2012-01-17 NOTE — Progress Notes (Signed)
[name] 1960/09/24 MRN [id number]  History of Present Illness:  This is a 51 year old white female with Crohn's disease of the terminal ileum. She is post terminal ileal resection in 2008 and again in July 2012. She has chronic abdominal pain and diarrhea, nausea and vomiting. She has been on maximum medical therapy including Remicade 10 mg/kg every 6 weeks, Imuran 100 mg a day, Entocort 6 mg daily. Her Prednisone was discontinued 3 weeks ago. She comes for an office visit every 2 months. Her last office visit was 10/11/2011. Her weight has been gradually decreasing from 216 pounds to a current 198 pounds. A CT enteroscopy this year did not show any obstruction. Small bowel capsule endoscopies on 3 separate occasions never reached the anastomosis. She takes Imodium, Lomotil and Colestid for diarrhea. She is doing reasonably well. There is a family history of colon cancer in her mother. She had a prior cholecystectomy. She also has a history of an adenomatous polyp on the last colonoscopy in November 2012 which showed a normal terminal ileum and open anastomosis.    Past Medical History  Diagnosis Date  . Crohn's disease   . Depression   . Hiatal hernia   . Adenomatous colon polyp   . GERD (gastroesophageal reflux disease)   . Vitamin B12 deficiency   . SBO (small bowel obstruction)     partial secondary to crohns stricture  . Psoriasis   . Gastritis   . Family history of malignant neoplasm of gastrointestinal tract    Past Surgical History  Procedure Date  . Partial hysterectomy   . Ileocecectomy 10/2008  . Carpal tunnel release     right  . Anal fissure repair   . Cholecystectomy   . Colon surgery     lap assisted ileocecectomy for crohns  . Colon surgery     right hemicolectomy for stricture    reports that she quit smoking about 27 years ago. Her smoking use included Cigarettes. She has a 10 pack-year smoking history. She has never used smokeless tobacco. She reports  that she does not drink alcohol or use illicit drugs. family history includes Breast cancer in her paternal grandmother; Colon cancer in her mother; and Colon polyps in her brother, father, and mother. Allergies  Allergen Reactions  . Codeine Hives and Nausea And Vomiting        Review of Systems: Mouth sores and cracked mouth corners usually respond to Diflucan  The remainder of the 10 point ROS is negative except as outlined in H&P   Physical Exam: General appearance  Well developed, in no distress. Appears cushingoid Eyes- non icteric. HEENT nontraumatic, normocephalic. Mouth no lesions, tongue papillated,  cheilosis. both mouth corners Neck supple without adenopathy, thyroid not enlarged, no carotid bruits, no JVD. Lungs Clear to auscultation bilaterally. Cor normal S1, normal S2, regular rhythm, no murmur,  quiet precordium. Abdomen: Soft with normal active bowel sounds. Very tender in epigastric area. Mild tenderness right lower and middle quadrant no distention. Rectal: Not done. Extremities no pedal edema. Skin no lesions. Neurological alert and oriented x 3. Psychological normal mood and affect.  Assessment and Plan:  Problem #1 Crohn's disease of the ileocolic anastomosis and of the small bowel. Patient is status post 2 prior terminal ileal resections. She is on maximum medical therapy and is doing reasonably well. 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 She will remain on the same medication until her next visit. She  will need refills on her Diflucan 100 mg daily for 2 weeks with one refill. We will check her CBC today. She needs a follow up office visit in 2 months. Problem #2 immunosuppression. Patient is continue on Imuran and Remicade as well as Entecort  Problem #3 gastroesophageal reflux. She will resume PPI  01/17/2012 [name]

## 2012-01-17 NOTE — Patient Instructions (Addendum)
We have sent the following medications to your pharmacy for you to pick up at your convenience: Diflucan daily x 14 days  Your physician has requested that you go to the basement for the following lab work before leaving today: CBC  Follow up with Dr Olevia Perches in 2 months.  CC: Dr [name]

## 2012-01-27 ENCOUNTER — Other Ambulatory Visit: Payer: Self-pay | Admitting: *Deleted

## 2012-01-27 DIAGNOSIS — K5000 Crohn's disease of small intestine without complications: Secondary | ICD-10-CM

## 2012-01-27 DIAGNOSIS — K5090 Crohn's disease, unspecified, without complications: Secondary | ICD-10-CM

## 2012-01-27 DIAGNOSIS — D849 Immunodeficiency, unspecified: Secondary | ICD-10-CM

## 2012-01-27 DIAGNOSIS — T8189XA Other complications of procedures, not elsewhere classified, initial encounter: Secondary | ICD-10-CM

## 2012-01-27 DIAGNOSIS — K648 Other hemorrhoids: Secondary | ICD-10-CM

## 2012-01-27 DIAGNOSIS — R1033 Periumbilical pain: Secondary | ICD-10-CM

## 2012-01-27 MED ORDER — AZATHIOPRINE 50 MG PO TABS: 100.0000 mg | ORAL_TABLET | Freq: Every day | ORAL | Status: DC

## 2012-01-27 MED ORDER — BUDESONIDE 3 MG PO CP24: 6.0000 mg | ORAL_CAPSULE | Freq: Every day | ORAL | Status: DC

## 2012-01-31 ENCOUNTER — Other Ambulatory Visit: Payer: Self-pay | Admitting: *Deleted

## 2012-01-31 MED ORDER — HYDROCODONE-ACETAMINOPHEN 10-325 MG PO TABS: ORAL_TABLET | ORAL | Status: DC

## 2012-01-31 NOTE — Telephone Encounter (Signed)
rx sent

## 2012-02-09 ENCOUNTER — Other Ambulatory Visit (HOSPITAL_COMMUNITY): Payer: Self-pay | Admitting: Internal Medicine

## 2012-02-10 ENCOUNTER — Encounter (HOSPITAL_COMMUNITY)
Admission: RE | Admit: 2012-02-10 | Discharge: 2012-02-10 | Disposition: A | Discharge status: Home / Self Care | Payer: PRIVATE HEALTH INSURANCE | Source: Ambulatory Visit | Attending: Internal Medicine | Admitting: Internal Medicine

## 2012-02-10 ENCOUNTER — Other Ambulatory Visit: Payer: Self-pay | Admitting: *Deleted

## 2012-02-10 ENCOUNTER — Encounter (HOSPITAL_COMMUNITY): Payer: Self-pay

## 2012-02-10 VITALS — BP 129/84 | HR 80 | Temp 97.8°F | Resp 18 | Wt 199.0 lb

## 2012-02-10 DIAGNOSIS — K5010 Crohn's disease of large intestine without complications: Secondary | ICD-10-CM | POA: Insufficient documentation

## 2012-02-10 MED ORDER — SODIUM CHLORIDE 0.9 % IV SOLN
INTRAVENOUS | Status: DC
  Administered 2012-02-10: 10:00:00 via INTRAVENOUS

## 2012-02-10 MED ORDER — SODIUM CHLORIDE 0.9 % IV SOLN
10.0000 mg/kg | INTRAVENOUS | Status: DC
  Administered 2012-02-10: 900 mg via INTRAVENOUS
  Filled 2012-02-10: qty 90

## 2012-02-10 MED ORDER — DIPHENHYDRAMINE HCL 25 MG PO TABS
25.0000 mg | ORAL_TABLET | ORAL | Status: DC
  Administered 2012-02-10: 25 mg via ORAL
  Filled 2012-02-10 (×2): qty 1

## 2012-02-10 MED ORDER — ACETAMINOPHEN 325 MG PO TABS
650.0000 mg | ORAL_TABLET | ORAL | Status: DC
  Administered 2012-02-10: 650 mg via ORAL
  Filled 2012-02-10: qty 2

## 2012-02-10 MED ORDER — DIPHENHYDRAMINE HCL 25 MG PO TABS
25.0000 mg | ORAL_TABLET | ORAL | Status: DC
  Filled 2012-02-10: qty 1

## 2012-03-06 ENCOUNTER — Other Ambulatory Visit: Payer: Self-pay | Admitting: *Deleted

## 2012-03-06 MED ORDER — HYDROCODONE-ACETAMINOPHEN 10-325 MG PO TABS: ORAL_TABLET | ORAL | Status: DC

## 2012-03-23 ENCOUNTER — Encounter (HOSPITAL_COMMUNITY)
Admission: RE | Admit: 2012-03-23 | Discharge: 2012-03-23 | Disposition: A | Discharge status: Home / Self Care | Payer: PRIVATE HEALTH INSURANCE | Source: Ambulatory Visit | Attending: Internal Medicine | Admitting: Internal Medicine

## 2012-03-23 ENCOUNTER — Encounter (HOSPITAL_COMMUNITY): Payer: Self-pay

## 2012-03-23 VITALS — BP 155/99 | HR 85 | Temp 98.4°F | Resp 18 | Ht 68.0 in | Wt 207.0 lb

## 2012-03-23 DIAGNOSIS — K5010 Crohn's disease of large intestine without complications: Secondary | ICD-10-CM

## 2012-03-23 MED ORDER — SODIUM CHLORIDE 0.9 % IV SOLN
INTRAVENOUS | Status: DC
  Administered 2012-03-23: 09:00:00 via INTRAVENOUS

## 2012-03-23 MED ORDER — INFLIXIMAB 100 MG IV SOLR
900.0000 mg | INTRAVENOUS | Status: DC
  Administered 2012-03-23: 900 mg via INTRAVENOUS
  Filled 2012-03-23: qty 90

## 2012-03-23 MED ORDER — DIPHENHYDRAMINE HCL 25 MG PO CAPS
25.0000 mg | ORAL_CAPSULE | ORAL | Status: DC
  Administered 2012-03-23: 25 mg via ORAL
  Filled 2012-03-23: qty 1

## 2012-03-23 MED ORDER — ACETAMINOPHEN 325 MG PO TABS
650.0000 mg | ORAL_TABLET | ORAL | Status: DC
  Administered 2012-03-23: 650 mg via ORAL
  Filled 2012-03-23: qty 2

## 2012-04-03 ENCOUNTER — Other Ambulatory Visit: Payer: Self-pay | Admitting: Internal Medicine

## 2012-04-17 ENCOUNTER — Other Ambulatory Visit: Payer: Self-pay | Admitting: *Deleted

## 2012-04-17 ENCOUNTER — Other Ambulatory Visit (INDEPENDENT_AMBULATORY_CARE_PROVIDER_SITE_OTHER): Payer: PRIVATE HEALTH INSURANCE

## 2012-04-17 ENCOUNTER — Encounter: Payer: Self-pay | Admitting: Internal Medicine

## 2012-04-17 ENCOUNTER — Ambulatory Visit (INDEPENDENT_AMBULATORY_CARE_PROVIDER_SITE_OTHER): Payer: PRIVATE HEALTH INSURANCE | Admitting: Internal Medicine

## 2012-04-17 VITALS — BP 160/90 | HR 84 | Ht 68.0 in | Wt 213.2 lb

## 2012-04-17 DIAGNOSIS — Z79899 Other long term (current) drug therapy: Secondary | ICD-10-CM

## 2012-04-17 DIAGNOSIS — K648 Other hemorrhoids: Secondary | ICD-10-CM

## 2012-04-17 DIAGNOSIS — E876 Hypokalemia: Secondary | ICD-10-CM

## 2012-04-17 DIAGNOSIS — K5090 Crohn's disease, unspecified, without complications: Secondary | ICD-10-CM

## 2012-04-17 DIAGNOSIS — R1033 Periumbilical pain: Secondary | ICD-10-CM

## 2012-04-17 DIAGNOSIS — D849 Immunodeficiency, unspecified: Secondary | ICD-10-CM

## 2012-04-17 DIAGNOSIS — T8189XA Other complications of procedures, not elsewhere classified, initial encounter: Secondary | ICD-10-CM

## 2012-04-17 DIAGNOSIS — K5000 Crohn's disease of small intestine without complications: Secondary | ICD-10-CM

## 2012-04-17 LAB — COMPREHENSIVE METABOLIC PANEL
ALT: 26 U/L (ref 0–35)
Albumin: 3.9 g/dL (ref 3.5–5.2)
CO2: 30 mEq/L (ref 19–32)
GFR: 97.94 mL/min (ref >60.00)
Glucose, Bld: 96 mg/dL (ref 70–99)
Potassium: 3.1 mEq/L — ABNORMAL LOW (ref 3.5–5.1)
Sodium: 140 mEq/L (ref 135–145)
Total Protein: 7.6 g/dL (ref 6.0–8.3)

## 2012-04-17 LAB — CBC WITH DIFFERENTIAL/PLATELET
Basophils Absolute: 0 10*3/uL (ref 0.0–0.1)
Eosinophils Relative: 1.2 % (ref 0.0–5.0)
Lymphocytes Relative: 30.3 % (ref 12.0–46.0)
Monocytes Relative: 9.6 % (ref 3.0–12.0)
Neutrophils Relative %: 58.5 % (ref 43.0–77.0)
Platelets: 327 10*3/uL (ref 150.0–400.0)
RDW: 14.6 % (ref 11.5–14.6)
WBC: 10.1 10*3/uL (ref 4.5–10.5)

## 2012-04-17 LAB — SEDIMENTATION RATE: Sed Rate: 13 mm/hr (ref 0–22)

## 2012-04-17 MED ORDER — HYDROCORTISONE ACE-PRAMOXINE 2.5-1 % RE CREA: TOPICAL_CREAM | Freq: Two times a day (BID) | RECTAL | Status: DC | PRN

## 2012-04-17 MED ORDER — AZATHIOPRINE 50 MG PO TABS: 100.0000 mg | ORAL_TABLET | Freq: Every day | ORAL | Status: DC

## 2012-04-17 MED ORDER — OMEPRAZOLE 20 MG PO CPDR: 20.0000 mg | DELAYED_RELEASE_CAPSULE | Freq: Two times a day (BID) | ORAL | Status: DC

## 2012-04-17 MED ORDER — DICYCLOMINE HCL 20 MG PO TABS: 20.0000 mg | ORAL_TABLET | Freq: Two times a day (BID) | ORAL | Status: DC

## 2012-04-17 MED ORDER — COLESTIPOL HCL 1 G PO TABS: 2.0000 g | ORAL_TABLET | Freq: Two times a day (BID) | ORAL | Status: DC

## 2012-04-17 MED ORDER — POTASSIUM CHLORIDE CRYS ER 20 MEQ PO TBCR: EXTENDED_RELEASE_TABLET | ORAL | Status: DC

## 2012-04-17 MED ORDER — DIPHENOXYLATE-ATROPINE 2.5-0.025 MG PO TABS: 1.0000 | ORAL_TABLET | Freq: Three times a day (TID) | ORAL | Status: DC

## 2012-04-17 MED ORDER — FLUCONAZOLE 100 MG PO TABS: 100.0000 mg | ORAL_TABLET | Freq: Every day | ORAL | Status: DC

## 2012-04-17 MED ORDER — HYDROCODONE-ACETAMINOPHEN 10-325 MG PO TABS: ORAL_TABLET | ORAL | Status: DC

## 2012-04-17 MED ORDER — BUDESONIDE 3 MG PO CP24: 6.0000 mg | ORAL_CAPSULE | Freq: Every day | ORAL | Status: DC

## 2012-04-17 MED ORDER — PROMETHAZINE HCL 25 MG RE SUPP: RECTAL | Status: DC

## 2012-04-17 NOTE — Progress Notes (Signed)
[name] 1960-12-24 MRN [id number]   History of Present Illness:  This is a 52 year old, white female with Crohn's disease of the terminal ileum. Her last appointment with me was 2 months ago. She had a terminal ileal resection in 2008 and again in July 2012. She has chronic abdominal pain, diarrhea and episodic nausea and vomiting. She has gained almost 10 pounds since her last visit. Her diarrhea has been attributed to small bowel obstruction and postcholecystectomy syndrome. She has done reasonably well since her last appointment. She continues on Remicade 10 mg/kg every 6 weeks, Imuran 100 mg daily, Entocort 6 mg daily, Diflucan weekly, probiotic, Imodium, Benadryl twice a day and Colestid 1 g daily.     Past Medical History  Diagnosis Date  . Crohn's disease   . Depression   . Hiatal hernia   . Adenomatous colon polyp   . GERD (gastroesophageal reflux disease)   . Vitamin B12 deficiency   . SBO (small bowel obstruction)     partial secondary to crohns stricture  . Psoriasis   . Gastritis   . Family history of malignant neoplasm of gastrointestinal tract    Past Surgical History  Procedure Laterality Date  . Partial hysterectomy    . Ileocecectomy  10/2008  . Carpal tunnel release      right  . Anal fissure repair    . Cholecystectomy    . Colon surgery      lap assisted ileocecectomy for crohns  . Colon surgery      right hemicolectomy for stricture    reports that she quit smoking about 28 years ago. Her smoking use included Cigarettes. She has a 10 pack-year smoking history. She has never used smokeless tobacco. She reports that she does not drink alcohol or use illicit drugs. family history includes Breast cancer in her paternal grandmother; Colon cancer in her mother; and Colon polyps in her brother, father, and mother. Allergies  Allergen Reactions  . Codeine Hives and Nausea And Vomiting        Review of Systems: Positive for heartburn nausea vomiting  and diarrhea  The remainder of the 10 point ROS is negative except as outlined in H&P   Physical Exam: General appearance  Well developed, in no distress. Appears cushingoid Eyes- non icteric. HEENT nontraumatic, normocephalic. Mouth no lesions, tongue papillated, no cheilosis. Neck supple without adenopathy, thyroid not enlarged, no carotid bruits, no JVD. Lungs Clear to auscultation bilaterally. Cor normal S1, normal S2, regular rhythm, no murmur,  quiet precordium. Abdomen: Truncal obesity. Diffusely tender. Normal active bowel sounds. No rebound. Rectal: Not done. Extremities no pedal edema. Skin no lesions. Neurological alert and oriented x 3. Psychological normal mood and affect.  Assessment and Plan:  Problem #1 Stable Crohn's disease at the ileocolic anastomosis. We will plan to continue all medications except for reducing her Entocort to 3 mg daily for 4 weeks and I will see her in 8 weeks. She will get a CBC, metabolic panel and sedimentation rate today. Her last colonoscopy was done in November 2012. She had an adenomatous polyp. A recall colonoscopy will be due in 2016. Problem#2 URI,  Slowly resolving  04/17/2012 [name]

## 2012-04-17 NOTE — Patient Instructions (Addendum)
We have sent medications to your pharmacy for you to pick up at your convenience.  Your physician has requested that you go to the basement for the following lab work before leaving today: CBC, CMET, Sed Rate  CC: Dr [name]

## 2012-04-26 ENCOUNTER — Telehealth: Payer: Self-pay | Admitting: *Deleted

## 2012-04-26 NOTE — Telephone Encounter (Signed)
Spoke with patient's family and reminded them patient is due for potassium recheck on Monday.

## 2012-04-26 NOTE — Telephone Encounter (Signed)
Message copied by Hulan Saas on Thu Apr 26, 2012 11:42 AM ------      Message from: Hulan Saas      Created: Tue Apr 17, 2012  1:18 PM       Call and remind patient potassium due on 04/30/12 for DB ------

## 2012-04-30 ENCOUNTER — Telehealth: Payer: Self-pay | Admitting: Internal Medicine

## 2012-04-30 ENCOUNTER — Other Ambulatory Visit (INDEPENDENT_AMBULATORY_CARE_PROVIDER_SITE_OTHER): Payer: PRIVATE HEALTH INSURANCE

## 2012-04-30 DIAGNOSIS — E876 Hypokalemia: Secondary | ICD-10-CM

## 2012-04-30 NOTE — Telephone Encounter (Signed)
Left message for pt to call back  °

## 2012-05-01 ENCOUNTER — Telehealth: Payer: Self-pay | Admitting: *Deleted

## 2012-05-01 NOTE — Telephone Encounter (Signed)
If she has no fever  And is off antibiotic, then OK to go ahead with Remicade

## 2012-05-01 NOTE — Telephone Encounter (Signed)
See result note.  

## 2012-05-01 NOTE — Telephone Encounter (Signed)
Spoke with patient's husband and gave him Dr. Nichola Sizer recommendation.

## 2012-05-01 NOTE — Telephone Encounter (Signed)
Spoke with patient's husband. He states patient is due for Remicade on Friday. She has had a sinus head cold. She was on antibiotics for it but has been off of them for 2 weeks. She is still coughing. No fever currently. He is asking if she should have the Remicade on Friday or delay it. Please, advise.

## 2012-05-04 ENCOUNTER — Encounter (HOSPITAL_COMMUNITY): Payer: Self-pay

## 2012-05-04 ENCOUNTER — Encounter (HOSPITAL_COMMUNITY)
Admission: RE | Admit: 2012-05-04 | Discharge: 2012-05-04 | Disposition: A | Discharge status: Home / Self Care | Payer: PRIVATE HEALTH INSURANCE | Source: Ambulatory Visit | Attending: Internal Medicine | Admitting: Internal Medicine

## 2012-05-04 VITALS — BP 124/78 | HR 70 | Temp 98.5°F | Resp 18 | Wt 214.4 lb

## 2012-05-04 DIAGNOSIS — K5010 Crohn's disease of large intestine without complications: Secondary | ICD-10-CM | POA: Insufficient documentation

## 2012-05-04 MED ORDER — ACETAMINOPHEN 325 MG PO TABS
650.0000 mg | ORAL_TABLET | ORAL | Status: DC
  Administered 2012-05-04: 650 mg via ORAL
  Filled 2012-05-04: qty 2

## 2012-05-04 MED ORDER — SODIUM CHLORIDE 0.9 % IV SOLN
1000.0000 mg | INTRAVENOUS | Status: AC
  Administered 2012-05-04: 1000 mg via INTRAVENOUS
  Filled 2012-05-04: qty 100

## 2012-05-04 MED ORDER — SODIUM CHLORIDE 0.9 % IV SOLN
INTRAVENOUS | Status: AC
  Administered 2012-05-04: 10:00:00 via INTRAVENOUS

## 2012-05-04 MED ORDER — DIPHENHYDRAMINE HCL 25 MG PO CAPS
25.0000 mg | ORAL_CAPSULE | ORAL | Status: DC
  Administered 2012-05-04: 25 mg via ORAL
  Filled 2012-05-04: qty 1

## 2012-06-13 ENCOUNTER — Other Ambulatory Visit (HOSPITAL_COMMUNITY): Payer: Self-pay | Admitting: *Deleted

## 2012-06-13 ENCOUNTER — Other Ambulatory Visit: Payer: Self-pay | Admitting: *Deleted

## 2012-06-13 DIAGNOSIS — K5090 Crohn's disease, unspecified, without complications: Secondary | ICD-10-CM

## 2012-06-15 ENCOUNTER — Encounter: Payer: Self-pay | Admitting: Internal Medicine

## 2012-06-15 ENCOUNTER — Encounter (HOSPITAL_COMMUNITY)
Admission: RE | Admit: 2012-06-15 | Discharge: 2012-06-15 | Disposition: A | Discharge status: Home / Self Care | Payer: 59 | Source: Ambulatory Visit | Attending: Internal Medicine | Admitting: Internal Medicine

## 2012-06-15 ENCOUNTER — Other Ambulatory Visit (INDEPENDENT_AMBULATORY_CARE_PROVIDER_SITE_OTHER): Payer: Medicare Other

## 2012-06-15 ENCOUNTER — Telehealth: Payer: Self-pay | Admitting: *Deleted

## 2012-06-15 ENCOUNTER — Ambulatory Visit (INDEPENDENT_AMBULATORY_CARE_PROVIDER_SITE_OTHER): Payer: Commercial Indemnity | Admitting: Internal Medicine

## 2012-06-15 ENCOUNTER — Encounter (HOSPITAL_COMMUNITY): Payer: Self-pay

## 2012-06-15 VITALS — BP 118/79 | HR 74 | Temp 98.2°F | Resp 16 | Wt 213.8 lb

## 2012-06-15 VITALS — BP 118/70 | HR 84 | Ht 68.0 in | Wt 215.1 lb

## 2012-06-15 DIAGNOSIS — Z79899 Other long term (current) drug therapy: Secondary | ICD-10-CM

## 2012-06-15 DIAGNOSIS — K5090 Crohn's disease, unspecified, without complications: Secondary | ICD-10-CM

## 2012-06-15 DIAGNOSIS — D849 Immunodeficiency, unspecified: Secondary | ICD-10-CM

## 2012-06-15 DIAGNOSIS — E876 Hypokalemia: Secondary | ICD-10-CM

## 2012-06-15 LAB — BASIC METABOLIC PANEL
BUN: 17 mg/dL (ref 6–23)
CO2: 25 mEq/L (ref 19–32)
Calcium: 8.7 mg/dL (ref 8.4–10.5)
Glucose, Bld: 98 mg/dL (ref 70–99)
Sodium: 134 mEq/L — ABNORMAL LOW (ref 135–145)

## 2012-06-15 MED ORDER — DIPHENHYDRAMINE HCL 25 MG PO CAPS
25.0000 mg | ORAL_CAPSULE | ORAL | Status: DC
  Administered 2012-06-15: 25 mg via ORAL
  Filled 2012-06-15: qty 1

## 2012-06-15 MED ORDER — SODIUM CHLORIDE 0.9 % IV SOLN
INTRAVENOUS | Status: DC
  Administered 2012-06-15: 10:00:00 via INTRAVENOUS

## 2012-06-15 MED ORDER — ACETAMINOPHEN 325 MG PO TABS
650.0000 mg | ORAL_TABLET | ORAL | Status: DC
  Administered 2012-06-15: 650 mg via ORAL
  Filled 2012-06-15: qty 2

## 2012-06-15 MED ORDER — SODIUM CHLORIDE 0.9 % IV SOLN
10.0000 mg/kg | INTRAVENOUS | Status: DC
  Administered 2012-06-15: 1000 mg via INTRAVENOUS
  Filled 2012-06-15: qty 100

## 2012-06-15 MED ORDER — POTASSIUM CHLORIDE CRYS ER 20 MEQ PO TBCR: EXTENDED_RELEASE_TABLET | ORAL | Status: DC

## 2012-06-15 NOTE — Telephone Encounter (Signed)
Per Dr. Olevia Perches, have patient take KDUR 20 meq two tablets today the TID x 2 days then take as directed. Potassium level to be rechecked on Monday.

## 2012-06-15 NOTE — Progress Notes (Signed)
[name] 1960/08/05 MRN [id number]  History of Present Illness:  This is a 52 year old white female with long-standing Crohn's disease of the small bowel. She is post terminal ileal resection in 2008 and again in July 2012. She has chronic diarrhea, abdominal pain, epigastric pain and episodic nausea and vomiting. She has chronic diarrhea from 5-10 bowel movements a day. She has been on multiple medications which include Remicade infusion 10 mg/kg every 6 weeks, Imuran 100 mg daily, Entocort 3 mg daily, Diflucan 100 mg weekly, Imodium one 3 times a day, Colestid 1 g 2 tablets daily and Bentyl 20 mg 3 times a day. She also receives B12 supplements. Her last colonoscopy in November 2012 showed adenomatous polyps. Blood tests at her office visit 2 months ago showed her sedimentation rate to be down to a normal rate. She received a Remicade infusion today.   Past Medical History  Diagnosis Date  . Crohn's disease   . Depression   . Hiatal hernia   . Adenomatous colon polyp   . GERD (gastroesophageal reflux disease)   . Vitamin B12 deficiency   . SBO (small bowel obstruction)     partial secondary to crohns stricture  . Psoriasis   . Gastritis   . Family history of malignant neoplasm of gastrointestinal tract   . Crohn's colitis   . Infection of eye region right   Past Surgical History  Procedure Laterality Date  . Partial hysterectomy    . Ileocecectomy  10/2008  . Carpal tunnel release      right  . Anal fissure repair    . Cholecystectomy    . Colon surgery      lap assisted ileocecectomy for crohns  . Colon surgery      right hemicolectomy for stricture    reports that she quit smoking about 28 years ago. Her smoking use included Cigarettes. She has a 10 pack-year smoking history. She has never used smokeless tobacco. She reports that she does not drink alcohol or use illicit drugs. family history includes Breast cancer in her paternal grandmother; Colon cancer in her  mother; and Colon polyps in her brother, father, and mother. Allergies  Allergen Reactions  . Codeine Hives and Nausea And Vomiting        Review of Systems: Denies heartburn chest pain. She has gained 2 pounds to currently 115 pounds  The remainder of the 10 point ROS is negative except as outlined in H&P   Physical Exam: General appearance  Well developed, in no distress appears cushingoid Eyes- non icteric. HEENT nontraumatic, normocephalic. Mouth no lesions, tongue papillated, no cheilosis. Neck supple without adenopathy, thyroid not enlarged, no carotid bruits, no JVD. Lungs Clear to auscultation bilaterally. Cor normal S1, normal S2, regular rhythm, no murmur,  quiet precordium. Abdomen: Protuberant. Very tender diffusely in all quadrants. Normal active bowel sounds. No fluid wave. Well-healed surgical scars. Rectal: Normal rectal sphincter tone. Painful digital exam. Small amount of Hemoccult negative stool. Extremities no pedal edema. Skin no lesions. Neurological alert and oriented x 3. Psychological normal mood and affect.  Assessment and Plan:  Problem #53 52 year old white female with Crohn's disease of the small bowel. She is currently stable on multiple medications. We will obtain Remicade antibodies, imuran metabolites and a basic metabolic panel. I have asked her to taper her Entocort to 3 mg every other day. She will run out of the medication in the next 3 weeks and will then discontinue it. I will see her  again in 2 months.   06/15/2012 [name]

## 2012-06-15 NOTE — Telephone Encounter (Signed)
Patient notified of Dr. Nichola Sizer recommendation. Rx sent.

## 2012-06-15 NOTE — Patient Instructions (Addendum)
Your physician has requested that you go to the basement for the following lab work before leaving today: BMET, Thiopurine metabolites, Remicade antibodies  Please decrease your Entocort to 3 mg by mouth every other day until completed. Then, discontinue.  Please follow up with Dr Olevia Perches in 2 months.  Cc: Dr [name]

## 2012-06-15 NOTE — Telephone Encounter (Signed)
Marcie Bal from short stay called to report patient has had eye infection x 4 weeks. She saw her PCP yesterday and was given Tobramycin drops. Should she get Remicade today? Per Dr. Olevia Perches, ok to proceed.

## 2012-06-15 NOTE — Progress Notes (Addendum)
Pt arrived today for REMICADE states she has has and eye infection for 4 weeks and was applying warm compresses to it .Saw PCP 06/14/12 and was prescribed Tobramycin eye drops. No drainage visible but pt states it "matts up" at night. States" it has really improved in just the last 24 hours" Placed call to Leone Payor RN at Dr Nichola Sizer off to report this and awaiting call back as to administer Remicade today or not

## 2012-06-15 NOTE — Progress Notes (Signed)
Received call back from Dr Nichola Sizer office and per [facility] after speaking to Dr Olevia Perches. Pt is OK to receive Remicade today

## 2012-06-22 LAB — INFLIXIMAB (IFX) CONC+ IFX AB: Infliximab Drug Level: 305 ug/mL

## 2012-07-05 ENCOUNTER — Other Ambulatory Visit: Payer: Self-pay | Admitting: Internal Medicine

## 2012-07-27 ENCOUNTER — Encounter (HOSPITAL_COMMUNITY)
Admission: RE | Admit: 2012-07-27 | Discharge: 2012-07-27 | Disposition: A | Discharge status: Home / Self Care | Payer: 59 | Source: Ambulatory Visit | Attending: Internal Medicine | Admitting: Internal Medicine

## 2012-07-27 ENCOUNTER — Encounter (HOSPITAL_COMMUNITY): Payer: Self-pay

## 2012-07-27 VITALS — BP 125/74 | HR 65 | Temp 98.0°F | Resp 18 | Ht 68.0 in | Wt 201.4 lb

## 2012-07-27 DIAGNOSIS — K5090 Crohn's disease, unspecified, without complications: Secondary | ICD-10-CM | POA: Insufficient documentation

## 2012-07-27 MED ORDER — SODIUM CHLORIDE 0.9 % IV SOLN
INTRAVENOUS | Status: DC
  Administered 2012-07-27: 09:00:00 via INTRAVENOUS

## 2012-07-27 MED ORDER — SODIUM CHLORIDE 0.9 % IV SOLN
10.0000 mg/kg | INTRAVENOUS | Status: DC
  Administered 2012-07-27: 1000 mg via INTRAVENOUS
  Filled 2012-07-27 (×2): qty 100

## 2012-07-27 MED ORDER — DIPHENHYDRAMINE HCL 25 MG PO CAPS
25.0000 mg | ORAL_CAPSULE | ORAL | Status: DC
  Administered 2012-07-27: 25 mg via ORAL
  Filled 2012-07-27: qty 1

## 2012-07-27 MED ORDER — ACETAMINOPHEN 325 MG PO TABS
650.0000 mg | ORAL_TABLET | ORAL | Status: DC
  Administered 2012-07-27: 650 mg via ORAL
  Filled 2012-07-27: qty 2

## 2012-08-03 ENCOUNTER — Encounter (HOSPITAL_COMMUNITY): Payer: 59

## 2012-08-21 ENCOUNTER — Other Ambulatory Visit (INDEPENDENT_AMBULATORY_CARE_PROVIDER_SITE_OTHER): Payer: Commercial Indemnity

## 2012-08-21 ENCOUNTER — Encounter: Payer: Self-pay | Admitting: Internal Medicine

## 2012-08-21 ENCOUNTER — Ambulatory Visit (INDEPENDENT_AMBULATORY_CARE_PROVIDER_SITE_OTHER): Payer: Commercial Indemnity | Admitting: Internal Medicine

## 2012-08-21 VITALS — BP 136/78 | HR 88 | Ht 67.72 in | Wt 199.1 lb

## 2012-08-21 DIAGNOSIS — K5000 Crohn's disease of small intestine without complications: Secondary | ICD-10-CM

## 2012-08-21 DIAGNOSIS — R1033 Periumbilical pain: Secondary | ICD-10-CM

## 2012-08-21 DIAGNOSIS — K50018 Crohn's disease of small intestine with other complication: Secondary | ICD-10-CM

## 2012-08-21 DIAGNOSIS — T8189XA Other complications of procedures, not elsewhere classified, initial encounter: Secondary | ICD-10-CM

## 2012-08-21 DIAGNOSIS — K648 Other hemorrhoids: Secondary | ICD-10-CM

## 2012-08-21 DIAGNOSIS — E876 Hypokalemia: Secondary | ICD-10-CM

## 2012-08-21 DIAGNOSIS — R1319 Other dysphagia: Secondary | ICD-10-CM

## 2012-08-21 LAB — COMPREHENSIVE METABOLIC PANEL
ALT: 22 U/L (ref 0–35)
AST: 32 U/L (ref 0–37)
Albumin: 3.6 g/dL (ref 3.5–5.2)
Calcium: 9.4 mg/dL (ref 8.4–10.5)
Chloride: 110 mEq/L (ref 96–112)
Potassium: 3.2 mEq/L — ABNORMAL LOW (ref 3.5–5.1)

## 2012-08-21 LAB — CBC WITH DIFFERENTIAL/PLATELET
Basophils Absolute: 0 10*3/uL (ref 0.0–0.1)
Basophils Relative: 0.6 % (ref 0.0–3.0)
Eosinophils Absolute: 0.1 10*3/uL (ref 0.0–0.7)
Lymphocytes Relative: 37 % (ref 12.0–46.0)
MCHC: 33.7 g/dL (ref 30.0–36.0)
Monocytes Relative: 9.6 % (ref 3.0–12.0)
Neutrophils Relative %: 51.7 % (ref 43.0–77.0)
RBC: 4.02 Mil/uL (ref 3.87–5.11)
RDW: 13.2 % (ref 11.5–14.6)

## 2012-08-21 MED ORDER — CYANOCOBALAMIN 1000 MCG/ML IJ SOLN
1000.0000 ug | Freq: Once | INTRAMUSCULAR | Status: AC
  Administered 2012-08-21: 1000 ug via INTRAMUSCULAR

## 2012-08-21 MED ORDER — DIPHENOXYLATE-ATROPINE 2.5-0.025 MG PO TABS: 1.0000 | ORAL_TABLET | Freq: Two times a day (BID) | ORAL | Status: DC

## 2012-08-21 MED ORDER — PROMETHAZINE HCL 25 MG PO TABS: 25.0000 mg | ORAL_TABLET | Freq: Four times a day (QID) | ORAL | Status: DC | PRN

## 2012-08-21 MED ORDER — AZATHIOPRINE 50 MG PO TABS: 100.0000 mg | ORAL_TABLET | Freq: Every day | ORAL | Status: DC

## 2012-08-21 MED ORDER — HYDROCORTISONE ACE-PRAMOXINE 2.5-1 % RE CREA: TOPICAL_CREAM | Freq: Two times a day (BID) | RECTAL | Status: DC | PRN

## 2012-08-21 MED ORDER — PREDNISONE 10 MG PO TABS: ORAL_TABLET | ORAL | Status: DC

## 2012-08-21 MED ORDER — HYDROCODONE-ACETAMINOPHEN 10-325 MG PO TABS: ORAL_TABLET | ORAL | Status: DC

## 2012-08-21 MED ORDER — BUDESONIDE 3 MG PO CP24: ORAL_CAPSULE | ORAL | Status: DC

## 2012-08-21 NOTE — Progress Notes (Signed)
[name] 08-05-1960 MRN [id number]        History of Present Illness:  This is a 52 year old white female with Crohn's disease of the small bowel. Last appointment 06/15/2012. She weighed 215 pounds. She is 199 pounds today. Her Crohn's disease was diagnosed on a small bowel capsule endoscopy in 2007 and she underwent terminal ileal resection in 2008 and again in July 2012. She has a chronic abdominal pain, diarrhea ,nausea and vomiting. We have  gradually stopped her steroids and she discontinued Entocort 3-4 weeks ago and she has been feeling very weak having crampy abdominal pain. increased diarrhea. increase nausea vomiting and weight loss. She is on  Remicade 10 mg per kilogram every 6 weeks. . She is on Imuran 100 mg a day with normal levels of therapeutic metabolites and low level 6- MMPN. Last colonoscopy in November 2012 showed  widely patent anastomosis with adenomatous polyp. She is complaining of dysphagia today ,mostly to liquids as well as solids. She has not taken her Colestid but takes Bentyl 20 mg 3 times a day which may be causing her dysphagia. She has been taking  Diflucan.   Past Medical History  Diagnosis Date  . Crohn's disease   . Depression   . Hiatal hernia   . Adenomatous colon polyp   . GERD (gastroesophageal reflux disease)   . Vitamin B12 deficiency   . SBO (small bowel obstruction)     partial secondary to crohns stricture  . Psoriasis   . Gastritis   . Family history of malignant neoplasm of gastrointestinal tract   . Crohn's colitis   . Infection of eye region right   Past Surgical History  Procedure Laterality Date  . Partial hysterectomy    . Ileocecectomy  10/2008  . Carpal tunnel release      right  . Anal fissure repair    . Cholecystectomy    . Colon surgery      lap assisted ileocecectomy for crohns  . Colon surgery      right hemicolectomy for stricture    reports that she quit smoking about 28 years ago. Her smoking use  included Cigarettes. She has a 10 pack-year smoking history. She has never used smokeless tobacco. She reports that she does not drink alcohol or use illicit drugs. family history includes Breast cancer in her paternal grandmother; Colon cancer in her mother; and Colon polyps in her brother, father, and mother. Allergies  Allergen Reactions  . Codeine Hives and Nausea And Vomiting        Review of Systems: Positive for dysphagia. Positive for abdominal pain diarrhea denies rectal bleeding  The remainder of the 10 point ROS is negative except as outlined in H&P   Physical Exam: General appearance  Well developed, in no distress. Appears cushingoid Eyes- non icteric. HEENT nontraumatic, normocephalic. Mouth no lesions, tongue papillated, no cheilosis. Neck supple without adenopathy, thyroid not enlarged, no carotid bruits, no JVD. Lungs Clear to auscultation bilaterally. Cor normal S1, normal S2, regular rhythm, no murmur,  quiet precordium. Abdomen: Diffusely tender but soft with normoactive bowel sounds. No palpable mass Rectal: Small amount of Hemoccult negative stool Extremities no pedal edema. Skin no lesions. Neurological alert and oriented x 3. Psychological normal mood and affect.  Assessment and Plan:  Very complex Crohn's disease of the small bowel. Patient has not done well since we've discontinued her Entocort. She will have to go back and we will start on 9 mg a day  for 4 weeks and tapered to 6 mg a day for 4 weeks and subsequently to 3 mg daily. We will refill most of her medications today. We will also go ahead and update her bone density and hepatitis B status. We will also check her electrolytes and we will add prednisone 20 mg one day before Remicade infusion, 20 mg on the day of infusion and 20 mg stay off to the infusion. I will see her in 8 weeks I have asked her to stop taking Bentyl   08/21/2012 [name]

## 2012-08-21 NOTE — Patient Instructions (Addendum)
Your physician has requested that you go to the basement for lab work before leaving today.  Please go directly to the basement level x-ray department for your Bone Density scan on 08/22/12 at 9:30am. Please wear elastic waist bands and no metal.   We have sent the following medications to your pharmacy for you to pick up at your convenience:Phenergan, Imuran, Entocort, Analpram, Prednisone, Norco, Lomotil.   cc: [name], MD

## 2012-08-22 ENCOUNTER — Ambulatory Visit (INDEPENDENT_AMBULATORY_CARE_PROVIDER_SITE_OTHER)
Admission: RE | Admit: 2012-08-22 | Discharge: 2012-08-22 | Disposition: A | Discharge status: Home / Self Care | Payer: Commercial Indemnity | Source: Ambulatory Visit | Attending: Internal Medicine | Admitting: Internal Medicine

## 2012-08-22 ENCOUNTER — Other Ambulatory Visit: Payer: Self-pay | Admitting: *Deleted

## 2012-08-22 DIAGNOSIS — R1033 Periumbilical pain: Secondary | ICD-10-CM

## 2012-08-22 DIAGNOSIS — K648 Other hemorrhoids: Secondary | ICD-10-CM

## 2012-08-22 DIAGNOSIS — K5000 Crohn's disease of small intestine without complications: Secondary | ICD-10-CM

## 2012-08-22 DIAGNOSIS — E876 Hypokalemia: Secondary | ICD-10-CM

## 2012-08-22 DIAGNOSIS — K50018 Crohn's disease of small intestine with other complication: Secondary | ICD-10-CM

## 2012-08-22 DIAGNOSIS — T8189XA Other complications of procedures, not elsewhere classified, initial encounter: Secondary | ICD-10-CM

## 2012-08-22 LAB — HEPATITIS B SURFACE ANTIBODY,QUALITATIVE: Hep B S Ab: NONREACTIVE

## 2012-08-22 LAB — HEPATITIS B SURFACE ANTIGEN: Hepatitis B Surface Ag: NEGATIVE

## 2012-08-22 LAB — HEPATITIS A ANTIBODY, TOTAL: Hep A Total Ab: NEGATIVE

## 2012-08-24 ENCOUNTER — Other Ambulatory Visit: Payer: Self-pay

## 2012-08-24 DIAGNOSIS — Z23 Encounter for immunization: Secondary | ICD-10-CM

## 2012-08-27 ENCOUNTER — Ambulatory Visit (INDEPENDENT_AMBULATORY_CARE_PROVIDER_SITE_OTHER): Payer: Commercial Indemnity | Admitting: Internal Medicine

## 2012-08-27 DIAGNOSIS — Z23 Encounter for immunization: Secondary | ICD-10-CM

## 2012-08-28 ENCOUNTER — Telehealth: Payer: Self-pay | Admitting: *Deleted

## 2012-08-28 NOTE — Telephone Encounter (Signed)
Message copied by Hulan Saas on Tue Aug 28, 2012  9:27 AM ------      Message from: Hulan Saas      Created: Wed Aug 22, 2012 10:24 AM       Remind patient due for Potassium level for DB on 08/29/12 ------

## 2012-08-28 NOTE — Telephone Encounter (Signed)
Left a message for patient to come for labs on 08/29/12.

## 2012-08-29 ENCOUNTER — Other Ambulatory Visit (INDEPENDENT_AMBULATORY_CARE_PROVIDER_SITE_OTHER): Payer: Commercial Indemnity

## 2012-08-29 DIAGNOSIS — E876 Hypokalemia: Secondary | ICD-10-CM

## 2012-09-09 ENCOUNTER — Encounter (HOSPITAL_COMMUNITY): Payer: Self-pay

## 2012-09-09 ENCOUNTER — Emergency Department (HOSPITAL_COMMUNITY): Payer: 59

## 2012-09-09 ENCOUNTER — Emergency Department (HOSPITAL_COMMUNITY)
Admission: EM | Admit: 2012-09-09 | Discharge: 2012-09-09 | Disposition: A | Discharge status: Home / Self Care | Payer: 59 | Attending: Emergency Medicine | Admitting: Emergency Medicine

## 2012-09-09 DIAGNOSIS — Z8669 Personal history of other diseases of the nervous system and sense organs: Secondary | ICD-10-CM | POA: Insufficient documentation

## 2012-09-09 DIAGNOSIS — K5090 Crohn's disease, unspecified, without complications: Secondary | ICD-10-CM | POA: Insufficient documentation

## 2012-09-09 DIAGNOSIS — Z87891 Personal history of nicotine dependence: Secondary | ICD-10-CM | POA: Insufficient documentation

## 2012-09-09 DIAGNOSIS — R300 Dysuria: Secondary | ICD-10-CM | POA: Insufficient documentation

## 2012-09-09 DIAGNOSIS — F329 Major depressive disorder, single episode, unspecified: Secondary | ICD-10-CM | POA: Insufficient documentation

## 2012-09-09 DIAGNOSIS — K219 Gastro-esophageal reflux disease without esophagitis: Secondary | ICD-10-CM | POA: Insufficient documentation

## 2012-09-09 DIAGNOSIS — Z79899 Other long term (current) drug therapy: Secondary | ICD-10-CM | POA: Insufficient documentation

## 2012-09-09 DIAGNOSIS — IMO0002 Reserved for concepts with insufficient information to code with codable children: Secondary | ICD-10-CM | POA: Insufficient documentation

## 2012-09-09 DIAGNOSIS — R3915 Urgency of urination: Secondary | ICD-10-CM | POA: Insufficient documentation

## 2012-09-09 DIAGNOSIS — Z872 Personal history of diseases of the skin and subcutaneous tissue: Secondary | ICD-10-CM | POA: Insufficient documentation

## 2012-09-09 DIAGNOSIS — Z860100 Personal history of colon polyps, unspecified: Secondary | ICD-10-CM | POA: Insufficient documentation

## 2012-09-09 DIAGNOSIS — R110 Nausea: Secondary | ICD-10-CM | POA: Insufficient documentation

## 2012-09-09 DIAGNOSIS — Z800 Family history of malignant neoplasm of digestive organs: Secondary | ICD-10-CM | POA: Insufficient documentation

## 2012-09-09 DIAGNOSIS — Z9889 Other specified postprocedural states: Secondary | ICD-10-CM | POA: Insufficient documentation

## 2012-09-09 DIAGNOSIS — N23 Unspecified renal colic: Secondary | ICD-10-CM

## 2012-09-09 DIAGNOSIS — R109 Unspecified abdominal pain: Secondary | ICD-10-CM | POA: Insufficient documentation

## 2012-09-09 DIAGNOSIS — Z8719 Personal history of other diseases of the digestive system: Secondary | ICD-10-CM | POA: Insufficient documentation

## 2012-09-09 DIAGNOSIS — Z862 Personal history of diseases of the blood and blood-forming organs and certain disorders involving the immune mechanism: Secondary | ICD-10-CM | POA: Insufficient documentation

## 2012-09-09 DIAGNOSIS — F3289 Other specified depressive episodes: Secondary | ICD-10-CM | POA: Insufficient documentation

## 2012-09-09 DIAGNOSIS — Z87442 Personal history of urinary calculi: Secondary | ICD-10-CM | POA: Insufficient documentation

## 2012-09-09 LAB — BASIC METABOLIC PANEL
BUN: 13 mg/dL (ref 6–23)
CO2: 28 mEq/L (ref 19–32)
Calcium: 10 mg/dL (ref 8.4–10.5)
GFR calc non Af Amer: 63 mL/min — ABNORMAL LOW (ref >90)
Glucose, Bld: 137 mg/dL — ABNORMAL HIGH (ref 70–99)
Potassium: 3.2 mEq/L — ABNORMAL LOW (ref 3.5–5.1)

## 2012-09-09 LAB — CBC WITH DIFFERENTIAL/PLATELET
Eosinophils Absolute: 0 10*3/uL (ref 0.0–0.7)
Eosinophils Relative: 0 % (ref 0–5)
HCT: 42.8 % (ref 36.0–46.0)
Hemoglobin: 14.4 g/dL (ref 12.0–15.0)
Lymphocytes Relative: 20 % (ref 12–46)
Lymphs Abs: 2.8 10*3/uL (ref 0.7–4.0)
MCH: 31.4 pg (ref 26.0–34.0)
MCV: 93.2 fL (ref 78.0–100.0)
Monocytes Relative: 9 % (ref 3–12)
RBC: 4.59 MIL/uL (ref 3.87–5.11)
WBC: 13.8 10*3/uL — ABNORMAL HIGH (ref 4.0–10.5)

## 2012-09-09 MED ORDER — IBUPROFEN 600 MG PO TABS: 600.0000 mg | ORAL_TABLET | Freq: Four times a day (QID) | ORAL | Status: DC | PRN

## 2012-09-09 MED ORDER — ONDANSETRON HCL 4 MG/2ML IJ SOLN
4.0000 mg | Freq: Once | INTRAMUSCULAR | Status: AC
  Administered 2012-09-09: 4 mg via INTRAVENOUS
  Filled 2012-09-09: qty 2

## 2012-09-09 MED ORDER — KETOROLAC TROMETHAMINE 30 MG/ML IJ SOLN
30.0000 mg | Freq: Once | INTRAMUSCULAR | Status: AC
  Administered 2012-09-09: 30 mg via INTRAVENOUS
  Filled 2012-09-09: qty 1

## 2012-09-09 MED ORDER — HYDROMORPHONE HCL PF 1 MG/ML IJ SOLN
1.0000 mg | Freq: Once | INTRAMUSCULAR | Status: AC
  Administered 2012-09-09: 1 mg via INTRAVENOUS
  Filled 2012-09-09: qty 1

## 2012-09-09 MED ORDER — TAMSULOSIN HCL 0.4 MG PO CAPS: 0.4000 mg | ORAL_CAPSULE | Freq: Every day | ORAL | Status: DC

## 2012-09-09 MED ORDER — PROMETHAZINE HCL 25 MG/ML IJ SOLN
12.5000 mg | Freq: Once | INTRAMUSCULAR | Status: AC
  Administered 2012-09-09: 12.5 mg via INTRAVENOUS
  Filled 2012-09-09: qty 1

## 2012-09-09 MED ORDER — PROMETHAZINE HCL 25 MG RE SUPP
25.0000 mg | Freq: Four times a day (QID) | RECTAL | Status: DC | PRN
  Filled 2012-09-09: qty 1

## 2012-09-09 MED ORDER — LORAZEPAM 2 MG/ML IJ SOLN
0.5000 mg | Freq: Once | INTRAMUSCULAR | Status: AC
  Administered 2012-09-09: 0.5 mg via INTRAVENOUS
  Filled 2012-09-09: qty 1

## 2012-09-09 NOTE — ED Notes (Signed)
Patient reports that she had a sudden onset of left lower back pain, nausea, and dysuria.

## 2012-09-09 NOTE — ED Provider Notes (Signed)
Patient states the pain has decreased but is still nauseated will give Phenergan suppository and DC home   Garald Balding, NP 09/09/12 2051  Garald Balding, NP 09/09/12 2052

## 2012-09-09 NOTE — ED Provider Notes (Signed)
Medical screening examination/treatment/procedure(s) were performed by non-physician practitioner and as supervising physician I was immediately available for consultation/collaboration.  Leota Jacobsen, MD 09/09/12 2028

## 2012-09-09 NOTE — ED Notes (Signed)
Pt provided urine sample but sample is not enough to send to lab. Will continue to monitor. RN Kerin Ransom T aware.

## 2012-09-09 NOTE — ED Provider Notes (Signed)
History    CSN: [id number] Arrival date & time 09/09/12  1552  First MD Initiated Contact with Patient 09/09/12 1657     Chief Complaint  Patient presents with  . Back Pain  . Nausea   (Consider location/radiation/quality/duration/timing/severity/associated sxs/prior Treatment) HPI Comments: Patient with history of Crohn's disease presents with complaint of acute onset of left back pain and flank pain with radiation to left lower abdomen beginning at approximately 1 PM. It has been associated with nausea but no vomiting. Patient took a Lortab that she has at home for her Crohn's disease without relief. It is also been associated with inability to urinate and urgency. No fever, chest pain, shortness of breath. The onset of this condition was acute. The course is constant. Aggravating factors: none. Alleviating factors: none. Patient states that this does not feel like her previous Crohn's symptoms.   Patient is a 52 y.o. female presenting with back pain. The history is provided by the patient.  Back Pain Associated symptoms: no abdominal pain, no chest pain, no dysuria, no fever and no headaches    Past Medical History  Diagnosis Date  . Crohn's disease   . Depression   . Hiatal hernia   . Adenomatous colon polyp   . GERD (gastroesophageal reflux disease)   . Vitamin B12 deficiency   . SBO (small bowel obstruction)     partial secondary to crohns stricture  . Psoriasis   . Gastritis   . Family history of malignant neoplasm of gastrointestinal tract   . Crohn's colitis   . Infection of eye region right   Past Surgical History  Procedure Laterality Date  . Partial hysterectomy    . Ileocecectomy  10/2008  . Carpal tunnel release      right  . Anal fissure repair    . Cholecystectomy    . Colon surgery      lap assisted ileocecectomy for crohns  . Colon surgery      right hemicolectomy for stricture   Family History  Problem Relation Age of Onset  . Colon cancer Mother    . Breast cancer Paternal Grandmother   . Colon polyps Mother   . Colon polyps Father   . Colon polyps Brother    History  Substance Use Topics  . Smoking status: Former Smoker -- 1.00 packs/day for 10 years    Types: Cigarettes    Quit date: 03/07/1984  . Smokeless tobacco: Never Used  . Alcohol Use: No     Comment: rare   OB History   Grav Para Term Preterm Abortions TAB SAB Ect Mult Living                 Review of Systems  Constitutional: Negative for fever.  HENT: Negative for sore throat and rhinorrhea.   Eyes: Negative for redness.  Respiratory: Negative for cough.   Cardiovascular: Negative for chest pain.  Gastrointestinal: Positive for nausea. Negative for vomiting, abdominal pain and diarrhea.  Genitourinary: Positive for urgency, flank pain and difficulty urinating. Negative for dysuria, frequency and hematuria.  Musculoskeletal: Positive for back pain. Negative for myalgias.  Skin: Negative for rash.  Neurological: Negative for headaches.    Allergies  Codeine  Home Medications   Current Outpatient Rx  Name  Route  Sig  Dispense  Refill  . albuterol (PROAIR HFA) 108 (90 BASE) MCG/ACT inhaler   Inhalation   Inhale 2 puffs into the lungs as needed.           Marland Kitchen  Alum & Mag Hydroxide-Simeth (MAGIC MOUTHWASH) SOLN   Oral   Take by mouth as needed.         Marland Kitchen amLODipine (NORVASC) 10 MG tablet   Oral   Take 1 tablet by mouth daily.         Marland Kitchen azaTHIOprine (IMURAN) 50 MG tablet   Oral   Take 2 tablets (100 mg total) by mouth daily.   60 tablet   2   . budesonide (ENTOCORT EC) 3 MG 24 hr capsule      Take 3 tablets by mouth once daily x 4 weeks, then reduce to 2 tablets by mouth once daily x 4 weeks, then reduce to one tablet by mouth once daily until office visit.   90 capsule   2   . budesonide-formoterol (SYMBICORT) 80-4.5 MCG/ACT inhaler   Inhalation   Inhale 2 puffs into the lungs 2 (two) times daily.   1 Inhaler   3   . colestipol  (COLESTID) 1 G tablet   Oral   Take 2 tablets (2 g total) by mouth 2 (two) times daily.   120 tablet   3   . cyclobenzaprine (FLEXERIL) 10 MG tablet      TAKE 1 TABLET AT BEDTIME AS NEEDED.   30 tablet   0     MUST KEEP 04-17-12 APPT FOR FURTHER REFILLS   . dicyclomine (BENTYL) 20 MG tablet   Oral   Take 1 tablet (20 mg total) by mouth 2 (two) times daily.   60 tablet   1   . diphenoxylate-atropine (LOMOTIL) 2.5-0.025 MG per tablet   Oral   Take 1 tablet by mouth 2 (two) times daily.   60 tablet   0   . eszopiclone (LUNESTA) 2 MG TABS   Oral   Take 2 mg by mouth at bedtime as needed. Take immediately before bedtime          . fenofibrate 160 MG tablet   Oral   Take 160 mg by mouth daily.         . fluconazole (DIFLUCAN) 100 MG tablet   Oral   Take 1 tablet (100 mg total) by mouth daily.   14 tablet   1   . fluticasone (FLONASE) 50 MCG/ACT nasal spray   Nasal   1 spray by Nasal route 2 (two) times daily.           Marland Kitchen HYDROcodone-acetaminophen (NORCO) 10-325 MG per tablet      TAKE 1 TO 2 TABLETS BY MOUTH EVERY 4 HOURS AS NEEDED FOR PAIN   50 tablet   0   . hydrocortisone-pramoxine (ANALPRAM-HC) 2.5-1 % rectal cream   Rectal   Place rectally 2 (two) times daily as needed.   30 g   2   . inFLIXimab (REMICADE) 100 MG injection   Intravenous   Inject 10 mg/kg into the vein every 6 (six) weeks.          Marland Kitchen loperamide (IMODIUM) 2 MG capsule   Oral   Take 2 mg by mouth 4 (four) times daily as needed.         Marland Kitchen EXPIRED: mesalamine (CANASA) 1000 MG suppository   Rectal   Place 1 suppository (1,000 mg total) rectally 2 (two) times daily.   30 suppository   2   . omeprazole (PRILOSEC) 20 MG capsule   Oral   Take 1 capsule (20 mg total) by mouth 2 (two) times daily.   60 capsule  11   . predniSONE (DELTASONE) 10 MG tablet      Take 2 tablets by mouth day before Remicade infusion, take 2 tablets by mouth the day of Remicade infusion and take 2  tablets by mouth the day after Remicade infusion.   6 tablet   0   . promethazine (PHENERGAN) 25 MG suppository      Insert rectally every 6 hours prn nausea, vomiting   30 each   2   . promethazine (PHENERGAN) 25 MG tablet   Oral   Take 1 tablet (25 mg total) by mouth every 6 (six) hours as needed for nausea.   40 tablet   1    BP 173/133  Pulse 83  Temp(Src) 98.4 F (36.9 C) (Oral)  Resp 18  Ht 5' 8"  (1.727 m)  Wt 195 lb (88.451 kg)  BMI 29.66 kg/m2  SpO2 97% Physical Exam  Nursing note and vitals reviewed. Constitutional: She appears well-developed and well-nourished.  Patient appears uncomfortable  HENT:  Head: Normocephalic and atraumatic.  Eyes: Conjunctivae are normal. Right eye exhibits no discharge. Left eye exhibits no discharge.  Neck: Normal range of motion. Neck supple.  Cardiovascular: Normal rate, regular rhythm and normal heart sounds.   Pulmonary/Chest: Effort normal and breath sounds normal.  Abdominal: Soft. There is no tenderness. There is CVA tenderness.  Musculoskeletal: She exhibits tenderness (Left flank).  Neurological: She is alert.  Skin: Skin is warm and dry.  Psychiatric: She has a normal mood and affect.    ED Course  Procedures (including critical care time) Labs Reviewed  CBC WITH DIFFERENTIAL - Abnormal; Notable for the following:    WBC 13.8 (*)    Neutro Abs 9.7 (*)    Monocytes Absolute 1.2 (*)    All other components within normal limits  BASIC METABOLIC PANEL - Abnormal; Notable for the following:    Potassium 3.2 (*)    Glucose, Bld 137 (*)    GFR calc non Af Amer 63 (*)    GFR calc Af Amer 73 (*)    All other components within normal limits  URINALYSIS, ROUTINE W REFLEX MICROSCOPIC   Ct Abdomen Pelvis Wo Contrast  09/09/2012   *RADIOLOGY REPORT*  Clinical Data: Left flank pain.  CT ABDOMEN AND PELVIS WITHOUT CONTRAST  Technique:  Multidetector CT imaging of the abdomen and pelvis was performed following the standard  protocol without intravenous contrast.  Comparison: 02/25/2011  Findings: Lung bases are clear.  No effusions.  Heart is normal size.  Prior cholecystectomy.  Liver, spleen, pancreas, adrenals and right kidney have an unremarkable unenhanced appearance.  There is mild left hydronephrosis.  Moderate perinephric and periureteral stranding.  There is a punctate 1-2 mm stone at the left ureteral vesicle junction.  Calcified phleboliths in the pelvis.  Postsurgical changes in the hepatic flexure of the colon. Bowel grossly unremarkable.  No free fluid, free air, or adenopathy.  Prior hysterectomy.  No adnexal masses.  Aorta is normal caliber.  No acute bony abnormality.  IMPRESSION: Punctate 1-2 mm left UVJ stone with mild hydronephrosis and moderate perinephric/periureteral stranding.   Original Report Authenticated By: Rolm Baptise, M.D.   1. Ureteral colic     6:57 PM Patient seen and examined. Work-up initiated. Medications ordered.   Vital signs reviewed and are as follows: Filed Vitals:   09/09/12 1710  BP: 173/133  Pulse: 83  Temp:   Resp:   BP 173/133  Pulse 83  Temp(Src) 98.4 F (36.9 C) (  Oral)  Resp 18  Ht 5' 8"  (1.727 m)  Wt 195 lb (88.451 kg)  BMI 29.66 kg/m2  SpO2 97%  CT shows small ureteral stone. Toradol ordered.   Handoff to [facility] NP at shift change. Plan: check UA, d/c when pain controlled.    MDM  Ureteral colic. Normal renal function.   Carlisle Cater, PA-C 09/09/12 2024

## 2012-09-09 NOTE — ED Notes (Signed)
Bladder Scanner showed 0 ml of urine.  Nurse Adela Lank aware

## 2012-09-10 ENCOUNTER — Encounter (HOSPITAL_COMMUNITY): Payer: Medicare Other

## 2012-09-10 ENCOUNTER — Other Ambulatory Visit: Payer: Self-pay | Admitting: *Deleted

## 2012-09-10 DIAGNOSIS — K50919 Crohn's disease, unspecified, with unspecified complications: Secondary | ICD-10-CM

## 2012-09-11 NOTE — ED Provider Notes (Signed)
Medical screening examination/treatment/procedure(s) were performed by non-physician practitioner and as supervising physician I was immediately available for consultation/collaboration.  Leota Jacobsen, MD 09/11/12 [phone]

## 2012-09-12 ENCOUNTER — Encounter (HOSPITAL_COMMUNITY): Payer: Self-pay

## 2012-09-12 ENCOUNTER — Encounter (HOSPITAL_COMMUNITY)
Admission: RE | Admit: 2012-09-12 | Discharge: 2012-09-12 | Disposition: A | Discharge status: Home / Self Care | Payer: 59 | Source: Ambulatory Visit | Attending: Internal Medicine | Admitting: Internal Medicine

## 2012-09-12 VITALS — BP 157/93 | HR 66 | Temp 97.3°F | Resp 18 | Ht 68.0 in | Wt 191.5 lb

## 2012-09-12 DIAGNOSIS — K5090 Crohn's disease, unspecified, without complications: Secondary | ICD-10-CM | POA: Insufficient documentation

## 2012-09-12 DIAGNOSIS — K50919 Crohn's disease, unspecified, with unspecified complications: Secondary | ICD-10-CM

## 2012-09-12 HISTORY — DX: Calculus of kidney: N20.0

## 2012-09-12 MED ORDER — DIPHENHYDRAMINE HCL 25 MG PO CAPS
25.0000 mg | ORAL_CAPSULE | ORAL | Status: DC
  Administered 2012-09-12: 25 mg via ORAL
  Filled 2012-09-12: qty 1

## 2012-09-12 MED ORDER — ACETAMINOPHEN 325 MG PO TABS
650.0000 mg | ORAL_TABLET | ORAL | Status: DC
  Administered 2012-09-12: 650 mg via ORAL
  Filled 2012-09-12: qty 2

## 2012-09-12 MED ORDER — SODIUM CHLORIDE 0.9 % IV SOLN
INTRAVENOUS | Status: DC
  Administered 2012-09-12: 20 mL/h via INTRAVENOUS

## 2012-09-12 MED ORDER — SODIUM CHLORIDE 0.9 % IV SOLN
10.0000 mg/kg | INTRAVENOUS | Status: DC
  Administered 2012-09-12: 900 mg via INTRAVENOUS
  Filled 2012-09-12: qty 90

## 2012-09-21 ENCOUNTER — Encounter (HOSPITAL_COMMUNITY): Payer: 59

## 2012-09-24 ENCOUNTER — Other Ambulatory Visit: Payer: Self-pay | Admitting: Internal Medicine

## 2012-09-27 ENCOUNTER — Ambulatory Visit (INDEPENDENT_AMBULATORY_CARE_PROVIDER_SITE_OTHER): Payer: Medicare Other | Admitting: Internal Medicine

## 2012-09-27 DIAGNOSIS — Z23 Encounter for immunization: Secondary | ICD-10-CM

## 2012-10-09 ENCOUNTER — Encounter: Payer: Self-pay | Admitting: Internal Medicine

## 2012-10-09 ENCOUNTER — Ambulatory Visit (INDEPENDENT_AMBULATORY_CARE_PROVIDER_SITE_OTHER): Payer: Medicare Other | Admitting: Internal Medicine

## 2012-10-09 ENCOUNTER — Ambulatory Visit (INDEPENDENT_AMBULATORY_CARE_PROVIDER_SITE_OTHER)
Admission: RE | Admit: 2012-10-09 | Discharge: 2012-10-09 | Disposition: A | Discharge status: Home / Self Care | Payer: Medicare Other | Source: Ambulatory Visit | Attending: Internal Medicine | Admitting: Internal Medicine

## 2012-10-09 VITALS — BP 138/90 | HR 76 | Ht 67.72 in | Wt 193.5 lb

## 2012-10-09 DIAGNOSIS — R1033 Periumbilical pain: Secondary | ICD-10-CM

## 2012-10-09 DIAGNOSIS — K648 Other hemorrhoids: Secondary | ICD-10-CM

## 2012-10-09 DIAGNOSIS — K5000 Crohn's disease of small intestine without complications: Secondary | ICD-10-CM

## 2012-10-09 DIAGNOSIS — D849 Immunodeficiency, unspecified: Secondary | ICD-10-CM

## 2012-10-09 DIAGNOSIS — T8189XA Other complications of procedures, not elsewhere classified, initial encounter: Secondary | ICD-10-CM

## 2012-10-09 DIAGNOSIS — K50018 Crohn's disease of small intestine with other complication: Secondary | ICD-10-CM

## 2012-10-09 MED ORDER — DICLOFENAC SODIUM 75 MG PO TBEC: 75.0000 mg | DELAYED_RELEASE_TABLET | Freq: Every day | ORAL | Status: DC

## 2012-10-09 MED ORDER — BUDESONIDE 3 MG PO CP24: 9.0000 mg | ORAL_CAPSULE | Freq: Every day | ORAL | Status: DC

## 2012-10-09 MED ORDER — HYDROCODONE-ACETAMINOPHEN 10-325 MG PO TABS: ORAL_TABLET | ORAL | Status: DC

## 2012-10-09 NOTE — Progress Notes (Signed)
[name] [birth year] MRN [id number]   History of Present Illness:  This is a 52 year old white female with complicated Crohn's disease of the small bowel . Last visit on 08/21/2012. She was in the emergency room on &/06/979  with renal colic and was found to have a left ureteral stone. Crohn's disease was diagnosed in 2007 on a small bowel capsule endoscopy and she has undergone 2 terminal ileal resections in 2008 and in July 2012. She has chronic abdominal pain, chronic diarrhea, nausea and vomiting. She has long-term steroid exposure. Steroids were discontinued last year and were reintroduced on the last visit because of increasing symptoms of Crohn's disease. She continues Remicade 10 mg/kg every 6 weeks, Imuran 100 mg daily with therapeutic levels of 6 MMPN. Her last colonoscopy in November 2012 showed a widely patent anastomosis and an adenomatous polyp. She has noticed marked improvement in her abdominal pain and diarrhea since Entocort was restarted 8 weeks ago at 9 mg a day, she is currently down to 6 mg/day.Marland Kitchen Her bone density was normal and her Hepatitis A and B serologies were negative. She is due for a TB skin test today.   Past Medical History  Diagnosis Date  . Crohn's disease   . Depression   . Hiatal hernia   . Adenomatous colon polyp   . GERD (gastroesophageal reflux disease)   . Vitamin B12 deficiency   . SBO (small bowel obstruction)     partial secondary to crohns stricture  . Psoriasis   . Gastritis   . Family history of malignant neoplasm of gastrointestinal tract   . Crohn's colitis   . Infection of eye region right  . Kidney stones    Past Surgical History  Procedure Laterality Date  . Partial hysterectomy    . Ileocecectomy  10/2008  . Carpal tunnel release      right  . Anal fissure repair    . Cholecystectomy    . Colon surgery      lap assisted ileocecectomy for crohns  . Colon surgery      right hemicolectomy for stricture    reports that she  quit smoking about 28 years ago. Her smoking use included Cigarettes. She has a 10 pack-year smoking history. She has never used smokeless tobacco. She reports that she does not drink alcohol or use illicit drugs. family history includes Breast cancer in her paternal grandmother; Colon cancer in her mother; and Colon polyps in her brother, father, and mother. Allergies  Allergen Reactions  . Codeine Hives and Nausea And Vomiting        Review of Systems: Dysphagia improved. Continued nausea and vomiting. Weight stable  The remainder of the 10 point ROS is negative except as outlined in H&P   Physical Exam: General appearance  Well developed, in no distress., Appears cushingoid Eyes- non icteric. HEENT nontraumatic, normocephalic. Mouth no lesions, tongue papillated, no cheilosis. Neck supple without adenopathy, thyroid not enlarged, no carotid bruits, no JVD. Lungs Clear to auscultation bilaterally. Cor normal S1, normal S2, regular rhythm, no murmur,  quiet precordium. Abdomen: Soft but diffusely tender in all quadrants. Normal active bowel sounds. No distention. No palpable mass. Rectal: Normal perianal area. Somewhat tight rectal sphincter tone. Painful digital exam. Small amount of Hemoccult negative stool. Extremities no pedal edema. Skin no lesions. Neurological alert and oriented x 3. Psychological normal mood and affect.  Assessment and Plan:  Problem #64 52 year old white female with Crohn's disease of the small bowel. She  has been on maximum medical therapy. She has been improved on Entocort 9 mg daily. We will have her to continue on the same dose. I will see her in 3 months. I am suspecting sacroiliitis based on her symptoms of lower pelvic pain. We will start her on diclofenac 75 mg daily. She needs a refill on her pain medications. We will obtain an x-ray of the lumbosacral spine and pelvis.  #2, immunosuppression. Continue Remicade and Imuran    10/09/2012 [name]

## 2012-10-09 NOTE — Patient Instructions (Addendum)
You have been scheduled for xrays today. Please go to radiology on the basement floor of [facility] for this test. No preparation is necessary.  We have sent the following medications to your pharmacy for you to pick up at your convenience: Entocort Diclofenac  We have given you a prescription for Norco to take to your pharmacy (we are unable to send this electronically)  Please follow up with Dr Olevia Perches in 3 months.  We have given you a TB skin test today. Please make certain to come back to the office for a reading between 48-72 hours from now to avoid requiring repeat testing.  CC: Dr [name]

## 2012-10-11 LAB — TB SKIN TEST: Induration: 0 mm

## 2012-10-24 ENCOUNTER — Encounter (HOSPITAL_COMMUNITY): Payer: Self-pay

## 2012-10-24 ENCOUNTER — Encounter (HOSPITAL_COMMUNITY)
Admission: RE | Admit: 2012-10-24 | Discharge: 2012-10-24 | Disposition: A | Discharge status: Home / Self Care | Payer: 59 | Source: Ambulatory Visit | Attending: Internal Medicine | Admitting: Internal Medicine

## 2012-10-24 VITALS — BP 170/91 | HR 64 | Temp 97.1°F | Resp 18 | Wt 190.0 lb

## 2012-10-24 DIAGNOSIS — K5090 Crohn's disease, unspecified, without complications: Secondary | ICD-10-CM | POA: Insufficient documentation

## 2012-10-24 DIAGNOSIS — K50919 Crohn's disease, unspecified, with unspecified complications: Secondary | ICD-10-CM

## 2012-10-24 MED ORDER — DIPHENHYDRAMINE HCL 25 MG PO CAPS
25.0000 mg | ORAL_CAPSULE | ORAL | Status: DC
  Administered 2012-10-24: 25 mg via ORAL
  Filled 2012-10-24: qty 1

## 2012-10-24 MED ORDER — SODIUM CHLORIDE 0.9 % IV SOLN
10.0000 mg/kg | INTRAVENOUS | Status: DC
  Administered 2012-10-24: 900 mg via INTRAVENOUS
  Filled 2012-10-24: qty 90

## 2012-10-24 MED ORDER — SODIUM CHLORIDE 0.9 % IV SOLN
INTRAVENOUS | Status: DC
  Administered 2012-10-24: 09:00:00 via INTRAVENOUS

## 2012-10-24 MED ORDER — ACETAMINOPHEN 325 MG PO TABS
650.0000 mg | ORAL_TABLET | ORAL | Status: DC
  Administered 2012-10-24: 650 mg via ORAL
  Filled 2012-10-24: qty 2

## 2012-11-14 ENCOUNTER — Other Ambulatory Visit: Payer: Self-pay | Admitting: *Deleted

## 2012-11-14 DIAGNOSIS — R1033 Periumbilical pain: Secondary | ICD-10-CM

## 2012-11-14 DIAGNOSIS — K648 Other hemorrhoids: Secondary | ICD-10-CM

## 2012-11-14 DIAGNOSIS — K50018 Crohn's disease of small intestine with other complication: Secondary | ICD-10-CM

## 2012-11-14 DIAGNOSIS — T8189XA Other complications of procedures, not elsewhere classified, initial encounter: Secondary | ICD-10-CM

## 2012-11-14 MED ORDER — HYDROCODONE-ACETAMINOPHEN 10-325 MG PO TABS: ORAL_TABLET | ORAL | Status: DC

## 2012-12-05 ENCOUNTER — Encounter (HOSPITAL_COMMUNITY): Admission: RE | Admit: 2012-12-05 | Payer: 59 | Source: Ambulatory Visit

## 2012-12-05 ENCOUNTER — Telehealth: Payer: Self-pay | Admitting: *Deleted

## 2012-12-05 NOTE — Telephone Encounter (Signed)
Spoke with patient and she thought her appointment was next week. She will call short stay to reschedule.

## 2012-12-05 NOTE — Telephone Encounter (Signed)
Received a call from [facility] that patient did not show for Remicade today. Left a message for patient to call me.

## 2012-12-12 ENCOUNTER — Other Ambulatory Visit: Payer: Self-pay | Admitting: *Deleted

## 2012-12-12 DIAGNOSIS — K5010 Crohn's disease of large intestine without complications: Secondary | ICD-10-CM

## 2012-12-13 ENCOUNTER — Encounter (HOSPITAL_COMMUNITY)
Admission: RE | Admit: 2012-12-13 | Discharge: 2012-12-13 | Disposition: A | Discharge status: Home / Self Care | Payer: 59 | Source: Ambulatory Visit | Attending: Internal Medicine | Admitting: Internal Medicine

## 2012-12-13 ENCOUNTER — Encounter (HOSPITAL_COMMUNITY): Payer: Self-pay

## 2012-12-13 VITALS — BP 123/71 | HR 85 | Temp 98.6°F | Resp 19 | Wt 178.8 lb

## 2012-12-13 DIAGNOSIS — K5010 Crohn's disease of large intestine without complications: Secondary | ICD-10-CM

## 2012-12-13 MED ORDER — DIPHENHYDRAMINE HCL 25 MG PO CAPS
25.0000 mg | ORAL_CAPSULE | ORAL | Status: DC
  Administered 2012-12-13: 25 mg via ORAL
  Filled 2012-12-13: qty 1

## 2012-12-13 MED ORDER — ACETAMINOPHEN 325 MG PO TABS
650.0000 mg | ORAL_TABLET | ORAL | Status: DC
  Administered 2012-12-13: 650 mg via ORAL
  Filled 2012-12-13: qty 2

## 2012-12-13 MED ORDER — SODIUM CHLORIDE 0.9 % IV SOLN
10.0000 mg/kg | INTRAVENOUS | Status: DC
  Administered 2012-12-13: 800 mg via INTRAVENOUS
  Filled 2012-12-13: qty 80

## 2012-12-13 MED ORDER — SODIUM CHLORIDE 0.9 % IV SOLN
INTRAVENOUS | Status: DC
  Administered 2012-12-13: 10:00:00 via INTRAVENOUS

## 2012-12-14 ENCOUNTER — Encounter: Payer: Self-pay | Admitting: Nurse Practitioner

## 2012-12-14 ENCOUNTER — Ambulatory Visit (INDEPENDENT_AMBULATORY_CARE_PROVIDER_SITE_OTHER): Payer: Medicare Other | Admitting: Nurse Practitioner

## 2012-12-14 ENCOUNTER — Telehealth: Payer: Self-pay | Admitting: Internal Medicine

## 2012-12-14 VITALS — BP 122/80 | HR 70 | Ht 67.0 in | Wt 179.0 lb

## 2012-12-14 DIAGNOSIS — K5090 Crohn's disease, unspecified, without complications: Secondary | ICD-10-CM

## 2012-12-14 DIAGNOSIS — T8189XA Other complications of procedures, not elsewhere classified, initial encounter: Secondary | ICD-10-CM

## 2012-12-14 DIAGNOSIS — L989 Disorder of the skin and subcutaneous tissue, unspecified: Secondary | ICD-10-CM | POA: Insufficient documentation

## 2012-12-14 DIAGNOSIS — K130 Diseases of lips: Secondary | ICD-10-CM

## 2012-12-14 DIAGNOSIS — K50018 Crohn's disease of small intestine with other complication: Secondary | ICD-10-CM

## 2012-12-14 DIAGNOSIS — R1033 Periumbilical pain: Secondary | ICD-10-CM

## 2012-12-14 DIAGNOSIS — K5000 Crohn's disease of small intestine without complications: Secondary | ICD-10-CM

## 2012-12-14 DIAGNOSIS — K648 Other hemorrhoids: Secondary | ICD-10-CM

## 2012-12-14 MED ORDER — PROMETHAZINE HCL 25 MG RE SUPP: 25.0000 mg | Freq: Four times a day (QID) | RECTAL | Status: DC | PRN

## 2012-12-14 MED ORDER — HYDROCODONE-ACETAMINOPHEN 10-325 MG PO TABS: ORAL_TABLET | ORAL | Status: DC

## 2012-12-14 MED ORDER — PROMETHAZINE HCL 25 MG RE SUPP: RECTAL | Status: DC

## 2012-12-14 MED ORDER — PREDNISONE 10 MG PO TABS: ORAL_TABLET | ORAL | Status: DC

## 2012-12-14 NOTE — Patient Instructions (Signed)
We have given you prescriptions for Prednisone and Norco ( Hydrocodone-acetaminophen). Get a good multivitamin. Call us next week with a progress report.

## 2012-12-14 NOTE — Telephone Encounter (Signed)
Patient asking for pain medication. States she has been having problems for 3 weeks. Today, she is having abdominal pain, diarrhea and vomiting. Scheduled with Tye Savoy, NP today at 3:30 PM.

## 2012-12-14 NOTE — Progress Notes (Signed)
History of Present Illness:  Patient is a 52 year old female known to Dr. Olevia Perches. She has complicated Crohn's disease and is s/p 2 terminal ileal resections in 2008 and in July 2012. She has chronic abdominal pain, diarrhea, nausea and vomiting. Patient is maintained on Imuran 136m daily, Q6 week Remicade infusions (x 3 years) and is also taking 643mof Entocort daily. Last Remicade infusion was yesterday. Patient was doing fairly well at time of last visit early August. She did have some pelvic pain felt to be sacroilitis for which Voltaren was started. On xray the SI joints looked okay but there were some lumbar findings (see results below).  Patient here with a 3 week history of diffuse lower abdominal pain, worse with meals. Having liquid yellow stool, multiple times a day. She is nauseated and vomits once a day. No blood in stool. No fever. No recent antibiotics. Feels like she is in a Crohn's flare. She was 193 pounds in early August, weight is 179 today. PCP prescribed anti-fungal cream for skin lesions on lower abdomen. Lesions itch.   Studies 10/09/12 LUMBAR SPINE - 2-3 VIEW  Comparison: None.  Findings: Three views of the lumbar spine submitted. No acute fracture or subluxation. SI joints have a symmetrical appearance  without significant sclerosis or bony fusion. Mild anterior  spurring upper endplate of the L4 vertebral body. Moderate  anterior spurring lower endplate of the L3 vertebral body. Mild  disc space flattening at L1-L2 level with minimal anterior  spurring.  IMPRESSION:  No acute fracture or subluxation. SI joints have a symmetrical  appearance without significant sclerosis or bony fusion. Mild  anterior spurring upper endplate of the L4 vertebral body.  Moderate anterior spurring lower endplate of the L3 vertebral body.  Mild disc space flattening at L1-L2 level with minimal anterior  Spurring.  PELVIS - 1-2 VIEW  Comparison: None  Findings:  Osseous mineralization  grossly normal for technique.  Symmetric hip and SI joints.  No acute fracture, dislocation or bone destruction.  Soft tissues unremarkable.  IMPRESSION:  No acute osseous abnormalities.   Current Medications, Allergies, Past Medical History, Past Surgical History, Family History and Social History were reviewed in [employer].  Physical Exam: General: Well developed , white female in no acute distress Head: Normocephalic and atraumatic Eyes:  sclerae anicteric, conjunctiva pink  Ears: Normal auditory acuity Mouth: Both mouth corners with splits, tongue smooth with fissuring.  Lungs: Clear throughout to auscultation Heart: Regular rate and rhythm Abdomen: Soft, non distended, mild diffuse tenderness. No masses, no hepatomegaly. Normal bowel sounds Musculoskeletal: Symmetrical with no gross deformities  Extremities: No edema  Neurological: Alert oriented x 4, grossly nonfocal Psychological:  Alert and cooperative. Normal mood and affect. Skin:  Several small dried up skin lesions on lower  abdomen. A larger lesion (approximately 2 inches in size) is located over pubic bone  Assessment and Recommendations:  [age]ear old female with Crohn's disease, s/p 2 terminal ileal resections. Patient is maintained on Imuran 10011maily, Q6 week Remicade infusions and is also taking 6mg59m Entocort. She comes in with acute on chronic abdominal pain, diarrhea, nausea and vomiting. She has lost several pounds since her visit in August.  She has some new erythematous skin lesions on her abdomen which are atypical for psoriasis (which she has), nodosum or pyoderma. Patient is well known to Dr. BrodOlevia Perches actually examined the patient with me today. Will check labs. Start Prednisone 20mg48mering by  18m every week. Her chronic pain medication was refilled. Will call patient with test results and for a condition update in a few days.

## 2012-12-18 ENCOUNTER — Telehealth: Payer: Self-pay | Admitting: *Deleted

## 2012-12-18 NOTE — Telephone Encounter (Signed)
I called the patient and advised her [name] wanted her to go to the lab on Friday when she was here.  I put the orders in but she didn't go to the lab which I may have forgotten to put that on her instructions.  I told her the lab is open from 7:30 am to 5:30 PM.  I asked her to please call me to let me know she got this message.

## 2012-12-18 NOTE — Telephone Encounter (Signed)
The patient did call me back today, 12-18-2012 and said she can come to the lab in the Am on Wednesday, 12-19-2012.  She said she remembered [place] telling her to go to the lab and when we were done with her office visit , she felt so bad, she just wanted to go home.  I told her to continue the Prednisone, and use the pain medication when she is going to be resting or going to bed.

## 2012-12-19 ENCOUNTER — Other Ambulatory Visit (INDEPENDENT_AMBULATORY_CARE_PROVIDER_SITE_OTHER): Payer: Medicare Other

## 2012-12-19 DIAGNOSIS — K5090 Crohn's disease, unspecified, without complications: Secondary | ICD-10-CM

## 2012-12-19 DIAGNOSIS — K130 Diseases of lips: Secondary | ICD-10-CM

## 2012-12-19 LAB — ALBUMIN: Albumin: 3.5 g/dL (ref 3.5–5.2)

## 2012-12-22 LAB — CAROTENE, SERUM: Carotene, Total-Serum: <2 ug/dL — ABNORMAL LOW (ref 6–77)

## 2012-12-26 ENCOUNTER — Telehealth: Payer: Self-pay | Admitting: Internal Medicine

## 2012-12-26 ENCOUNTER — Other Ambulatory Visit: Payer: Self-pay | Admitting: Internal Medicine

## 2012-12-26 NOTE — Telephone Encounter (Signed)
I could not find Formula 303 in u-pto date, is it OTC?
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
## 2012-12-27 NOTE — Telephone Encounter (Signed)
It is OK to take

## 2012-12-27 NOTE — Telephone Encounter (Signed)
Left a message that ok to take medication.

## 2012-12-27 NOTE — Telephone Encounter (Signed)
Yes, it is OTC: Formula 303 is a "natural relaxant."

## 2013-01-04 ENCOUNTER — Encounter: Payer: Self-pay | Admitting: Internal Medicine

## 2013-01-04 ENCOUNTER — Telehealth: Payer: Self-pay | Admitting: *Deleted

## 2013-01-04 ENCOUNTER — Other Ambulatory Visit (INDEPENDENT_AMBULATORY_CARE_PROVIDER_SITE_OTHER): Payer: Medicare Other

## 2013-01-04 ENCOUNTER — Ambulatory Visit (INDEPENDENT_AMBULATORY_CARE_PROVIDER_SITE_OTHER): Payer: Medicare Other | Admitting: Internal Medicine

## 2013-01-04 VITALS — BP 120/74 | HR 108 | Ht 67.71 in | Wt 173.2 lb

## 2013-01-04 DIAGNOSIS — K50018 Crohn's disease of small intestine with other complication: Secondary | ICD-10-CM

## 2013-01-04 DIAGNOSIS — R197 Diarrhea, unspecified: Secondary | ICD-10-CM

## 2013-01-04 DIAGNOSIS — T8189XA Other complications of procedures, not elsewhere classified, initial encounter: Secondary | ICD-10-CM

## 2013-01-04 DIAGNOSIS — D849 Immunodeficiency, unspecified: Secondary | ICD-10-CM

## 2013-01-04 DIAGNOSIS — K5000 Crohn's disease of small intestine without complications: Secondary | ICD-10-CM

## 2013-01-04 DIAGNOSIS — K648 Other hemorrhoids: Secondary | ICD-10-CM

## 2013-01-04 DIAGNOSIS — R1033 Periumbilical pain: Secondary | ICD-10-CM

## 2013-01-04 DIAGNOSIS — E876 Hypokalemia: Secondary | ICD-10-CM

## 2013-01-04 LAB — SEDIMENTATION RATE: Sed Rate: 45 mm/hr — ABNORMAL HIGH (ref 0–22)

## 2013-01-04 LAB — BASIC METABOLIC PANEL
Chloride: 101 mEq/L (ref 96–112)
GFR: 88.47 mL/min (ref >60.00)
Potassium: 2.7 mEq/L — CL (ref 3.5–5.1)
Sodium: 139 mEq/L (ref 135–145)

## 2013-01-04 LAB — CBC WITH DIFFERENTIAL/PLATELET
Basophils Absolute: 0 10*3/uL (ref 0.0–0.1)
Basophils Relative: 0.6 % (ref 0.0–3.0)
Eosinophils Absolute: 0.1 10*3/uL (ref 0.0–0.7)
Lymphocytes Relative: 40.7 % (ref 12.0–46.0)
MCHC: 34.9 g/dL (ref 30.0–36.0)
MCV: 91.8 fl (ref 78.0–100.0)
Monocytes Absolute: 0.8 10*3/uL (ref 0.1–1.0)
Neutrophils Relative %: 47.7 % (ref 43.0–77.0)
Platelets: 284 10*3/uL (ref 150.0–400.0)
RBC: 4.26 Mil/uL (ref 3.87–5.11)
RDW: 12.8 % (ref 11.5–14.6)

## 2013-01-04 MED ORDER — CYANOCOBALAMIN 1000 MCG/ML IJ SOLN
1000.0000 ug | Freq: Once | INTRAMUSCULAR | Status: AC
  Administered 2013-01-04: 1000 ug via INTRAMUSCULAR

## 2013-01-04 MED ORDER — AZATHIOPRINE 50 MG PO TABS: 100.0000 mg | ORAL_TABLET | Freq: Every day | ORAL | Status: DC

## 2013-01-04 MED ORDER — POTASSIUM CHLORIDE CRYS ER 20 MEQ PO TBCR: EXTENDED_RELEASE_TABLET | ORAL | Status: DC

## 2013-01-04 MED ORDER — PREDNISONE 10 MG PO TABS: ORAL_TABLET | ORAL | Status: DC

## 2013-01-04 MED ORDER — BUDESONIDE 3 MG PO CP24: 9.0000 mg | ORAL_CAPSULE | Freq: Every day | ORAL | Status: DC

## 2013-01-04 MED ORDER — CLOTRIMAZOLE-BETAMETHASONE 1-0.05 % EX CREA: TOPICAL_CREAM | Freq: Four times a day (QID) | CUTANEOUS | Status: DC | PRN

## 2013-01-04 NOTE — Patient Instructions (Addendum)
You have been scheduled for an endoscopy with propofol. Please follow written instructions given to you at your visit today. If you use inhalers (even only as needed), please bring them with you on the day of your procedure. Your physician has requested that you go to www.startemmi.com and enter the access code given to you at your visit today. This web site gives a general overview about your procedure. However, you should still follow specific instructions given to you by our office regarding your preparation for the procedure.  Your physician has requested that you go to the basement for the following lab work before leaving today: CBC, BMET, Sed Rate  We have sent the following medications to your pharmacy for you to pick up at your convenience: Entocort Imuran Prednisone Clotrimazole Cream  We have given you a B12 injection today.  We have changed your Remicade dosage to 5 mg/kg every 6 weeks instead of 10 mg/kg every 6 weeks. Short Stay has been advised of this.  CC :Dr [name]

## 2013-01-04 NOTE — Telephone Encounter (Signed)
Per Dr Olevia Perches, patient is to take KDUR 20 mEq 1 tablet every 4 hours x 4, then twice daily thereafter due to her potassium of 2.7. Patient should come for labs Monday. Left message for patient to call back on my cell. Meds already sent to pharmacy.

## 2013-01-04 NOTE — Telephone Encounter (Signed)
I have called and spoken to [facility] @ [place] [place] Stay to advise that Dr Olevia Perches would like patient to have Remicade 5 mg/kg every 6 weeks now in place of Remicade 10 mg/kg every 6 weeks. Orders have been entered in [facility] for this as well.

## 2013-01-04 NOTE — Progress Notes (Signed)
[name] 29-Mar-1960 MRN [id number]   History of Present Illness:  This is a 52 year old white female with complicated Crohn's disease of the small bowel diagnosed in 2007. Her last office visit  on 12/14/2012. She has been losing weight. She weighed 213 pounds in February 2014, 193 pounds in August 2014 and 174 pounds today. She has diarrhea, nausea and a decreased appetite. We have increased  prednisone to 30 mg today in addition to a maintenance dose of budesonide 9 mg today. She has been on Imuran 100 mg daily and Remicade infusions 10 mg/kg every 6 weeks. She has nausea, abdominal pain. She denies rectal bleeding. Her last sedimentation rate was elevated at 42 and serum carotene was less than 2, suggesting malabsorption. Albumin was 3.5. She has not noticed any dramatic improvement since her steroids have been increased. A CT scan of the abdomen in July 2014 to look for kidney stones showed mild hydronephrosis. Her last colonoscopy in November 2012 showed a wide open anastomosis and adenomatous polyp. Her bone density is normal. Her hepatitis A and B serologies are negative.   Past Medical History  Diagnosis Date  . Crohn's disease   . Depression   . Hiatal hernia   . Adenomatous colon polyp   . GERD (gastroesophageal reflux disease)   . Vitamin B12 deficiency   . SBO (small bowel obstruction)     partial secondary to crohns stricture  . Psoriasis   . Gastritis   . Family history of malignant neoplasm of gastrointestinal tract   . Crohn's colitis   . Infection of eye region right  . Kidney stones    Past Surgical History  Procedure Laterality Date  . Partial hysterectomy    . Ileocecectomy  10/2008  . Carpal tunnel release      right  . Anal fissure repair    . Cholecystectomy    . Colon surgery      lap assisted ileocecectomy for crohns  . Colon surgery      right hemicolectomy for stricture    reports that she quit smoking about 28 years ago. Her smoking use  included Cigarettes. She has a 10 pack-year smoking history. She has never used smokeless tobacco. She reports that she does not drink alcohol or use illicit drugs. family history includes Breast cancer in her paternal grandmother; Colon cancer in her mother; Colon polyps in her brother, father, and mother. Allergies  Allergen Reactions  . Codeine Hives and Nausea And Vomiting        Review of Systems: Continued weight loss. Diarrhea  The remainder of the 10 point ROS is negative except as outlined in H&P   Physical Exam: General appearance  Well developed, in no distress. Chronically ill appearing Eyes- non icteric. HEENT nontraumatic, normocephalic. Mouth no lesions, tongue papillated,  cheilosis.  Neck supple without adenopathy, thyroid not enlarged, no carotid bruits, no JVD. Lungs Clear to auscultation bilaterally. Cor normal S1, normal S2, regular rhythm, no murmur,  quiet precordium. Abdomen: Soft with quiet bowel sounds. Diffusely tender. No distention or tympany. Rectal: Perianal area very tender. Digital exam not done. Extremities no pedal edema. Skin intensely erythematous lesions in the suprapubic area and the abdomen as well as on her hands and in the peri-rectal area suggestive of psoriasis. Neurological alert and oriented x 3. Psychological normal mood and affect. Appears depressed  Assessment and Plan:  Problem #91 52 year old white female with chronic Crohn's disease involving  small bowel. She  severe diarrhea  due to the terminal ileal resection, Crohn's disease, possible malabsorption or bile overflow. She had a prior cholecystectomy.She has  low serum carotene c/w malabsorption..    Problem #2 Erythematous rash suggestive of recurrent psoriasis. We will decrease her Remicade to 5 mg/kg infusions and she will continue on prednisone systemically as well as on budesonide.  Problem #3 Samples of a probiotic have been given. Today, she will receive B12 1,000 mcg IM.  We will refill her Imuran, budesonide and prednisone. We will schedule an upper endoscopy and biopsies and will check a CBC, BMET and sedimentation rate.   01/04/2013 [name]

## 2013-01-05 ENCOUNTER — Encounter: Payer: Self-pay | Admitting: Internal Medicine

## 2013-01-06 ENCOUNTER — Encounter: Payer: Self-pay | Admitting: Internal Medicine

## 2013-01-06 DIAGNOSIS — K50018 Crohn's disease of small intestine with other complication: Secondary | ICD-10-CM

## 2013-01-06 DIAGNOSIS — R1033 Periumbilical pain: Secondary | ICD-10-CM

## 2013-01-06 DIAGNOSIS — T8189XA Other complications of procedures, not elsewhere classified, initial encounter: Secondary | ICD-10-CM

## 2013-01-06 DIAGNOSIS — K648 Other hemorrhoids: Secondary | ICD-10-CM

## 2013-01-07 ENCOUNTER — Ambulatory Visit: Payer: 59 | Admitting: Physician Assistant

## 2013-01-07 ENCOUNTER — Telehealth: Payer: Self-pay | Admitting: *Deleted

## 2013-01-07 MED ORDER — PREDNISONE 10 MG PO TABS: ORAL_TABLET | ORAL | Status: DC

## 2013-01-07 MED ORDER — AZATHIOPRINE 50 MG PO TABS: 100.0000 mg | ORAL_TABLET | Freq: Every day | ORAL | Status: DC

## 2013-01-07 MED ORDER — BUDESONIDE 3 MG PO CP24: 9.0000 mg | ORAL_CAPSULE | Freq: Every day | ORAL | Status: DC

## 2013-01-07 NOTE — Telephone Encounter (Signed)
I have spoken to patient (Friday PM) and have advised her of Dr Nichola Sizer recommendations. She will come for labs today.

## 2013-01-07 NOTE — Telephone Encounter (Signed)
Message copied by Larina Bras on Mon Jan 07, 2013 10:53 AM ------      Message from: Lafayette Dragon      Created: Mon Jan 07, 2013  9:06 AM      Regarding: Mercy Riding, I am concerned about her, please ,work her in with an extender, she will need CT scan of the abdomen, stool cultures , may be an admission.Don't schedule her for CT yet, let her see the extender today , if possible. ------

## 2013-01-07 NOTE — Telephone Encounter (Signed)
Dr Olevia Perches, patient requesting refills on pain medication as she says the hydrocodone 1 every 6 hours is not controlling her pain. She also notes that she has been continuing to have diarrhea and has been unable to sleep more than a couple hours at a time. Please advise.Marland KitchenMarland KitchenMarland Kitchen

## 2013-01-07 NOTE — Telephone Encounter (Signed)
I called and spoke to patient to advise her of Dr Nichola Sizer recommendations. She scheduled for 1:30 pm with Nicoletta Ba, PA-C today. Before finishing this note, patient called back to cancel to say she did not have a ride today. She rescheduled to tomorrow. Left voicemail for patient that it is important that she have labs drawn and office visit as well. Dr Olevia Perches made aware.

## 2013-01-08 ENCOUNTER — Ambulatory Visit: Payer: 59 | Admitting: Physician Assistant

## 2013-01-10 ENCOUNTER — Encounter: Payer: Self-pay | Admitting: Internal Medicine

## 2013-01-10 ENCOUNTER — Other Ambulatory Visit: Payer: Self-pay

## 2013-01-16 ENCOUNTER — Encounter (HOSPITAL_COMMUNITY): Payer: PRIVATE HEALTH INSURANCE

## 2013-01-22 ENCOUNTER — Ambulatory Visit (AMBULATORY_SURGERY_CENTER): Payer: Medicare Other | Admitting: Internal Medicine

## 2013-01-22 ENCOUNTER — Telehealth: Payer: Self-pay | Admitting: *Deleted

## 2013-01-22 ENCOUNTER — Other Ambulatory Visit: Payer: Self-pay | Admitting: Internal Medicine

## 2013-01-22 ENCOUNTER — Other Ambulatory Visit: Payer: Self-pay | Admitting: *Deleted

## 2013-01-22 ENCOUNTER — Encounter: Payer: Self-pay | Admitting: *Deleted

## 2013-01-22 ENCOUNTER — Encounter: Payer: Self-pay | Admitting: Internal Medicine

## 2013-01-22 VITALS — BP 165/96 | HR 73 | Temp 97.5°F | Resp 25 | Ht 67.0 in | Wt 173.0 lb

## 2013-01-22 DIAGNOSIS — K50018 Crohn's disease of small intestine with other complication: Secondary | ICD-10-CM

## 2013-01-22 DIAGNOSIS — K219 Gastro-esophageal reflux disease without esophagitis: Secondary | ICD-10-CM

## 2013-01-22 DIAGNOSIS — T8189XA Other complications of procedures, not elsewhere classified, initial encounter: Secondary | ICD-10-CM

## 2013-01-22 DIAGNOSIS — K648 Other hemorrhoids: Secondary | ICD-10-CM

## 2013-01-22 DIAGNOSIS — K5090 Crohn's disease, unspecified, without complications: Secondary | ICD-10-CM

## 2013-01-22 DIAGNOSIS — R1033 Periumbilical pain: Secondary | ICD-10-CM

## 2013-01-22 DIAGNOSIS — R197 Diarrhea, unspecified: Secondary | ICD-10-CM

## 2013-01-22 DIAGNOSIS — R112 Nausea with vomiting, unspecified: Secondary | ICD-10-CM

## 2013-01-22 DIAGNOSIS — K5000 Crohn's disease of small intestine without complications: Secondary | ICD-10-CM

## 2013-01-22 MED ORDER — FLUCONAZOLE 100 MG PO TABS: 100.0000 mg | ORAL_TABLET | Freq: Every day | ORAL | Status: DC

## 2013-01-22 MED ORDER — HYDROCODONE-ACETAMINOPHEN 10-325 MG PO TABS: ORAL_TABLET | ORAL | Status: DC

## 2013-01-22 MED ORDER — SODIUM CHLORIDE 0.9 % IV SOLN: 500.0000 mL | INTRAVENOUS | Status: DC

## 2013-01-22 NOTE — Telephone Encounter (Signed)
Per Dr Olevia Perches, patient needs #30 hydrocodone 5/325. Rx created and awaiting Dr Nichola Sizer signature.

## 2013-01-22 NOTE — Patient Instructions (Signed)
YOU HAD AN ENDOSCOPIC PROCEDURE TODAY AT [facility]: Refer to the procedure report that was given to you for any specific questions about what was found during the examination.  If the procedure report does not answer your questions, please call your gastroenterologist to clarify.  If you requested that your care partner not be given the details of your procedure findings, then the procedure report has been included in a sealed envelope for you to review at your convenience later.  YOU SHOULD EXPECT: Some feelings of bloating in the abdomen. Passage of more gas than usual.  Walking can help get rid of the air that was put into your GI tract during the procedure and reduce the bloating. If you had a lower endoscopy (such as a colonoscopy or flexible sigmoidoscopy) you may notice spotting of blood in your stool or on the toilet paper. If you underwent a bowel prep for your procedure, then you may not have a normal bowel movement for a few days.  DIET: Your first meal following the procedure should be a light meal and then it is ok to progress to your normal diet.  A half-sandwich or bowl of soup is an example of a good first meal.  Heavy or fried foods are harder to digest and may make you feel nauseous or bloated.  Likewise meals heavy in dairy and vegetables can cause extra gas to form and this can also increase the bloating.  Drink plenty of fluids but you should avoid alcoholic beverages for 24 hours.  ACTIVITY: Your care partner should take you home directly after the procedure.  You should plan to take it easy, moving slowly for the rest of the day.  You can resume normal activity the day after the procedure however you should NOT DRIVE or use heavy machinery for 24 hours (because of the sedation medicines used during the test).    SYMPTOMS TO REPORT IMMEDIATELY: A gastroenterologist can be reached at any hour.  During normal business hours, 8:30 AM to 5:00 PM Monday through Friday,  call [phone].  After hours and on weekends, please call the GI answering service at [phone] who will take a message and have the physician on call contact you.    Following upper endoscopy (EGD)  Vomiting of blood or coffee ground material  New chest pain or pain under the shoulder blades  Painful or persistently difficult swallowing  New shortness of breath  Fever of 100F or higher  Black, tarry-looking stools  FOLLOW UP: If any biopsies were taken you will be contacted by phone or by letter within the next 1-3 weeks.  Call your gastroenterologist if you have not heard about the biopsies in 3 weeks.  Our staff will call the home number listed on your records the next business day following your procedure to check on you and address any questions or concerns that you may have at that time regarding the information given to you following your procedure. This is a courtesy call and so if there is no answer at the home number and we have not heard from you through the emergency physician on call, we will assume that you have returned to your regular daily activities without incident.  SIGNATURES/CONFIDENTIALITY: You and/or your care partner have signed paperwork which will be entered into your electronic medical record.  These signatures attest to the fact that that the information above on your After Visit Summary has been reviewed and is understood.  Full  responsibility of the confidentiality of this discharge information lies with you and/or your care-partner.  Esophagitis and stricture information given.  Anti reflux measures. Low residue diet. Continue Prilosec  Twice a day. Continue Crohns meds Monitor wt. Loss, Zofran and phenergan for nausea.  Office visit in 4 weeks.  Diflucan rx to pharmacy  Hydrocodone rx. Sent with pt. And care partner.Marland Kitchen

## 2013-01-22 NOTE — Progress Notes (Signed)
Called to room to assist during endoscopic procedure.  Patient ID and intended procedure confirmed with present staff. Received instructions for my participation in the procedure from the performing physician.  

## 2013-01-22 NOTE — Op Note (Signed)
[facility]  [address] [place], [id number]   ENDOSCOPY PROCEDURE REPORT  PATIENT: Jessica, [name]  MR#: [id number] BIRTHDATE: 03-18-1960 , 52  yrs. old GENDER: Female ENDOSCOPIST: Lafayette Dragon, MD REFERRED BY:  Dr [name] PROCEDURE DATE:  01/22/2013 PROCEDURE:  EGD w/ biopsy and Maloney dilation of esophagus ASA CLASS:     Class III INDICATIONS:  nausea, vomiting, weight loss of 20 and,, Crohn's disease of the distal small bowel.  Continued abdominal pain.  Rule out small bowel obstruction. MEDICATIONS: MAC sedation, administered by CRNA and propofol (Diprivan) 459m IV TOPICAL ANESTHETIC: Cetacaine Spray  DESCRIPTION OF PROCEDURE: After the risks benefits and alternatives of the procedure were thoroughly explained, informed consent was obtained.  The LB [id number] was introduced through the mouth and advanced to the second portion of the duodenum. Without limitations.  The instrument was slowly withdrawn as the mucosa was fully examined.      Esophagus: esophageal mucosa was mildly edematous throughout the proximal mid and distal esophagus. Her there were no erosions. There was a nonobstructing fibrous stricture at the GE junction at 35 cm from the incisors. It allowed the endoscope to traverse into the stomach.  Stomach: There was small amount also bilious material pooled along the greater curvature of the stomach. Gastric antrum. Unremarkable as was the pyloric outlet. Retroflexion of the endoscope revealing normal fundus and cardia Duodenum duodenal bulb was unremarkable. Descending duodenum was examined through the second and third portion showed normal mucosa. There was no accumulation of the fluid or abnormal dilatation of the lumen which would indicate the chronic obstruction. Biopsies were taken from second portion duodenum to rule out the villous atrophy or Crohn's disease [         The scope was then withdrawn  from the patient and the procedure completed. 4[place] Commonsdilator passed without fluoroscopic guidance through the stricture into the stomach without difficulty. There was no blood on the dilator  COMPLICATIONS: There were no complications. ENDOSCOPIC IMPRESSION:   mild diffuse esophagitis likely secondary to vomiting Nonobstructing distal esophageal stricture. Status post passage of a 453[place] dilator Status post small bowel biopsy without evidence of for Crohn's disease  RECOMMENDATIONS: antireflux measures Continue Prilosec twice a day Low residue diet Continue all medications for Crohn's disease including, remicade, prednisone taper Monitor weight loss, take Zofran and Phenergan for nausea Followup in the office in 4 weeks  REPEAT EXAM: for EGD pending biopsy results.  eSigned:  DLafayette Dragon MD 01/22/2013 3:10 PM   CC:  PATIENT NAME:  [name], DecockMR#: [id number]

## 2013-01-22 NOTE — Progress Notes (Signed)
Patient did not experience any of the following events: a burn prior to discharge; a fall within the facility; wrong site/side/patient/procedure/implant event; or a hospital transfer or hospital admission upon discharge from the facility. (G8907) Patient did not have preoperative order for IV antibiotic SSI prophylaxis. (G8918)  

## 2013-01-23 ENCOUNTER — Encounter (HOSPITAL_COMMUNITY)
Admission: RE | Admit: 2013-01-23 | Discharge: 2013-01-23 | Disposition: A | Discharge status: Home / Self Care | Payer: 59 | Source: Ambulatory Visit | Attending: Internal Medicine | Admitting: Internal Medicine

## 2013-01-23 ENCOUNTER — Telehealth: Payer: Self-pay

## 2013-01-23 ENCOUNTER — Encounter (HOSPITAL_COMMUNITY): Payer: Self-pay

## 2013-01-23 ENCOUNTER — Other Ambulatory Visit (HOSPITAL_COMMUNITY): Payer: Self-pay | Admitting: Internal Medicine

## 2013-01-23 ENCOUNTER — Telehealth: Payer: Self-pay | Admitting: *Deleted

## 2013-01-23 VITALS — BP 161/89 | HR 72 | Temp 98.0°F | Resp 18 | Ht 68.0 in | Wt 177.5 lb

## 2013-01-23 DIAGNOSIS — K648 Other hemorrhoids: Secondary | ICD-10-CM | POA: Insufficient documentation

## 2013-01-23 DIAGNOSIS — K5000 Crohn's disease of small intestine without complications: Secondary | ICD-10-CM | POA: Insufficient documentation

## 2013-01-23 DIAGNOSIS — T8189XA Other complications of procedures, not elsewhere classified, initial encounter: Secondary | ICD-10-CM

## 2013-01-23 DIAGNOSIS — R197 Diarrhea, unspecified: Secondary | ICD-10-CM | POA: Diagnosis present

## 2013-01-23 DIAGNOSIS — K5090 Crohn's disease, unspecified, without complications: Secondary | ICD-10-CM | POA: Diagnosis present

## 2013-01-23 DIAGNOSIS — R1033 Periumbilical pain: Secondary | ICD-10-CM

## 2013-01-23 DIAGNOSIS — D849 Immunodeficiency, unspecified: Secondary | ICD-10-CM | POA: Diagnosis present

## 2013-01-23 DIAGNOSIS — K50018 Crohn's disease of small intestine with other complication: Secondary | ICD-10-CM

## 2013-01-23 MED ORDER — SODIUM CHLORIDE 0.9 % IV SOLN
5.0000 mg/kg | INTRAVENOUS | Status: DC
  Administered 2013-01-23: 400 mg via INTRAVENOUS
  Filled 2013-01-23: qty 40

## 2013-01-23 MED ORDER — SODIUM CHLORIDE 0.9 % IV SOLN
Freq: Once | INTRAVENOUS | Status: AC
  Administered 2013-01-23: 09:00:00 via INTRAVENOUS

## 2013-01-23 MED ORDER — ACETAMINOPHEN 325 MG PO TABS
650.0000 mg | ORAL_TABLET | ORAL | Status: DC
  Administered 2013-01-23: 650 mg via ORAL
  Filled 2013-01-23: qty 2

## 2013-01-23 MED ORDER — DIPHENHYDRAMINE HCL 25 MG PO CAPS
25.0000 mg | ORAL_CAPSULE | ORAL | Status: DC
  Administered 2013-01-23: 25 mg via ORAL
  Filled 2013-01-23: qty 1

## 2013-01-23 NOTE — Telephone Encounter (Signed)
Message copied by Marlon Pel on Wed Jan 23, 2013  8:47 AM ------      Message from: Lucio Edward T      Created: Tue Jan 22, 2013  6:44 PM       On call note at 78. Pts husband calling. Pt had EGD with biopsy and Maloney dilation today. She has Crohns disease. She notes intermittent diffuse abdominal pains that started 2-3 hours after her procedure this afternoon. The pains have improved since they started. Pt feels they are gas pains. Advised only sips of clear liquids, Gas-X and Gaviscon until pain has completely resolved. If symptoms do not resolve over next few hours she is advised to go to the ED for evaluation.             Chelse Matas please convert this to a phone note or other permanent note. There is an open pt email from 11/2 and Epic would not allow me to start a new phone note. ------

## 2013-01-23 NOTE — Telephone Encounter (Signed)
  Follow up Call-  Call back number 01/22/2013 05/17/2011 01/13/2011  Post procedure Call Back phone  # [phone] ok to leave a message  Permission to leave phone message Yes Yes -     Patient questions:  Do you have a fever, pain , or abdominal swelling? no Pain Score  0 *  Have you tolerated food without any problems? no  Have you been able to return to your normal activities? yes  Do you have any questions about your discharge instructions: Diet   no Medications  no Follow up visit  no  Do you have questions or concerns about your Care? no  Actions: * If pain score is 4 or above: No action needed, pain <4.  Pt states she had problems with gas yesterday 11/18 and was unable to pass any, when she tried food or liquids she would vomit everything back up, pt states she was able to pass gas this am and had a piece of toast at 0600am, pt states she is feeling better this am and food and liquids are staying down, advised pt to call back if she started having any of the symptoms on her discharge instruction sheet, pt states understanding-adm

## 2013-01-24 ENCOUNTER — Encounter: Payer: Self-pay | Admitting: *Deleted

## 2013-01-28 ENCOUNTER — Encounter: Payer: Self-pay | Admitting: Internal Medicine

## 2013-02-19 ENCOUNTER — Other Ambulatory Visit (INDEPENDENT_AMBULATORY_CARE_PROVIDER_SITE_OTHER): Payer: Commercial Indemnity

## 2013-02-19 ENCOUNTER — Encounter: Payer: Self-pay | Admitting: Internal Medicine

## 2013-02-19 ENCOUNTER — Ambulatory Visit (INDEPENDENT_AMBULATORY_CARE_PROVIDER_SITE_OTHER): Payer: Commercial Indemnity | Admitting: Internal Medicine

## 2013-02-19 VITALS — BP 120/80 | HR 80 | Ht 68.0 in | Wt 175.4 lb

## 2013-02-19 DIAGNOSIS — R112 Nausea with vomiting, unspecified: Secondary | ICD-10-CM

## 2013-02-19 DIAGNOSIS — Z23 Encounter for immunization: Secondary | ICD-10-CM

## 2013-02-19 DIAGNOSIS — R1033 Periumbilical pain: Secondary | ICD-10-CM

## 2013-02-19 DIAGNOSIS — E876 Hypokalemia: Secondary | ICD-10-CM

## 2013-02-19 DIAGNOSIS — K50018 Crohn's disease of small intestine with other complication: Secondary | ICD-10-CM

## 2013-02-19 DIAGNOSIS — T8189XA Other complications of procedures, not elsewhere classified, initial encounter: Secondary | ICD-10-CM

## 2013-02-19 DIAGNOSIS — K5000 Crohn's disease of small intestine without complications: Secondary | ICD-10-CM

## 2013-02-19 DIAGNOSIS — K648 Other hemorrhoids: Secondary | ICD-10-CM

## 2013-02-19 LAB — BASIC METABOLIC PANEL
BUN: 8 mg/dL (ref 6–23)
CO2: 28 mEq/L (ref 19–32)
Calcium: 9.2 mg/dL (ref 8.4–10.5)
Creatinine, Ser: 0.7 mg/dL (ref 0.4–1.2)
GFR: 95.97 mL/min (ref >60.00)

## 2013-02-19 LAB — POTASSIUM: Potassium: 3.3 mEq/L — ABNORMAL LOW (ref 3.5–5.1)

## 2013-02-19 MED ORDER — BUDESONIDE 3 MG PO CP24: 9.0000 mg | ORAL_CAPSULE | Freq: Every day | ORAL | Status: DC

## 2013-02-19 MED ORDER — HYDROCODONE-ACETAMINOPHEN 10-325 MG PO TABS
ORAL_TABLET | ORAL | Status: DC
Start: 2013-02-19 — End: 2013-03-06

## 2013-02-19 MED ORDER — PROMETHAZINE HCL 25 MG PO TABS: 25.0000 mg | ORAL_TABLET | Freq: Four times a day (QID) | ORAL | Status: DC | PRN

## 2013-02-19 MED ORDER — FIRST-DUKES MOUTHWASH MT SUSP: OROMUCOSAL | Status: DC

## 2013-02-19 MED ORDER — DIPHENOXYLATE-ATROPINE 2.5-0.025 MG PO TABS: 2.0000 | ORAL_TABLET | Freq: Two times a day (BID) | ORAL | Status: DC

## 2013-02-19 NOTE — Progress Notes (Signed)
RYA RAUSCH Dec 20, 1960 [id number]   History of Present Illness:  This is a 52 year old white female with Crohn's disease of the small bowel since 2007. This was diagnosed on small bowel capsule endoscopy. Her last office visit with [place] was on 01/04/2013. An upper endoscopy on 01/23/2013 for nausea and vomiting showed a mild esophageal stricture. She was dilated. Small bowel biopsies were negative for Crohn's disease. She has gained 2 pounds since her last visit to a current 175 pounds. Her last colonoscopy in November 9935 showed ileocolic anastomosis and a tubular adenoma. She is due today for a flu shot. We have decreased her Remicade to 5 mg/kg. She continues on Imuran 100 mg daily and she has tapered  prednisone from 30 mg to a current 10 mg a day. She is having 4-6 loose bowel movements a day and occasional nausea and vomiting.   Past Medical History  Diagnosis Date  . Crohn's disease   . Depression   . Hiatal hernia   . Adenomatous colon polyp   . GERD (gastroesophageal reflux disease)   . Vitamin B12 deficiency   . SBO (small bowel obstruction)     partial secondary to crohns stricture  . Psoriasis   . Gastritis   . Family history of malignant neoplasm of gastrointestinal tract   . Crohn's colitis   . Infection of eye region right  . Kidney stones   . Esophagitis   . Esophageal stricture     Past Surgical History  Procedure Laterality Date  . Partial hysterectomy    . Ileocecectomy  10/2008  . Carpal tunnel release      right  . Anal fissure repair    . Cholecystectomy    . Colon surgery      lap assisted ileocecectomy for crohns  . Colon surgery      right hemicolectomy for stricture    Allergies  Allergen Reactions  . Codeine Hives and Nausea And Vomiting    Family history and social history have been reviewed.  Review of Systems:   The remainder of the 10 point ROS is negative except as outlined in the H&P  Physical Exam: General Appearance Well  developed, in no distress, chronically ill-appearing Eyes  Non icteric  HEENT  Non traumatic, normocephalic  Mouth No lesion, tongue papillated,  cheilosis Neck Supple without adenopathy, thyroid not enlarged, no carotid bruits, no JVD Lungs Clear to auscultation bilaterally COR Normal S1, normal S2, regular rhythm, no murmur, quiet precordium Abdomen diffusely tender with quiet bowel sounds. No rebound or tympany Rectal not done Extremities  No pedal edema Skin No lesions psoriasis suprapubic area Neurological Alert and oriented x 3 Psychological Normal mood and affect  Assessment and Plan:   Problem #1 Crohn's disease of the small bowel. Patient had a partial small bowel obstruction. She has had incomplete small bowel capsule endoscopies. She is doing reasonably well maintaining her weight. She will continue the same regimen of Remicade, Imuran and Entocort. She will continue a prednisone taper from 10 mg for 2 weeks to 5 mg for 2 weeks and then discontinue. We will be rechecking her B12 and sed rate today. She needs a refill on several of her medications. I will see her in 2 months.    [name] 02/19/2013

## 2013-02-19 NOTE — Patient Instructions (Addendum)
We have sent the following medications to your pharmacy for you to pick up at your convenience: Magic Mouthwash Lomotil Phenergan Entocort  We have given you a prescription for Norco to take to your pharmacy.  We have given you a flu shot today.  Your physician has requested that you go to the basement for the following lab work before leaving today: Sed, BMET  Follow up in 8 weeks with Dr Olevia Perches

## 2013-02-20 ENCOUNTER — Other Ambulatory Visit: Payer: Self-pay | Admitting: *Deleted

## 2013-02-20 DIAGNOSIS — Z23 Encounter for immunization: Secondary | ICD-10-CM

## 2013-02-20 MED ORDER — POTASSIUM CHLORIDE CRYS ER 20 MEQ PO TBCR: 20.0000 meq | EXTENDED_RELEASE_TABLET | Freq: Once | ORAL | Status: DC

## 2013-03-04 ENCOUNTER — Ambulatory Visit (INDEPENDENT_AMBULATORY_CARE_PROVIDER_SITE_OTHER): Payer: Commercial Indemnity | Admitting: Internal Medicine

## 2013-03-04 DIAGNOSIS — Z23 Encounter for immunization: Secondary | ICD-10-CM

## 2013-03-05 ENCOUNTER — Other Ambulatory Visit: Payer: Self-pay | Admitting: Urology

## 2013-03-06 ENCOUNTER — Encounter (HOSPITAL_COMMUNITY): Payer: Self-pay

## 2013-03-06 ENCOUNTER — Ambulatory Visit (HOSPITAL_COMMUNITY)
Admission: RE | Admit: 2013-03-06 | Discharge: 2013-03-06 | Disposition: A | Discharge status: Home / Self Care | Payer: Commercial Indemnity | Source: Ambulatory Visit | Attending: Urology | Admitting: Urology

## 2013-03-06 ENCOUNTER — Encounter (HOSPITAL_COMMUNITY): Payer: Self-pay | Admitting: Pharmacy Technician

## 2013-03-06 ENCOUNTER — Encounter (HOSPITAL_COMMUNITY)
Admission: RE | Admit: 2013-03-06 | Discharge: 2013-03-06 | Disposition: A | Discharge status: Home / Self Care | Payer: Commercial Indemnity | Source: Ambulatory Visit | Attending: Internal Medicine | Admitting: Internal Medicine

## 2013-03-06 ENCOUNTER — Encounter (HOSPITAL_COMMUNITY)
Admission: RE | Admit: 2013-03-06 | Discharge: 2013-03-06 | Disposition: A | Discharge status: Home / Self Care | Payer: Commercial Indemnity | Source: Ambulatory Visit | Attending: Urology | Admitting: Urology

## 2013-03-06 VITALS — BP 138/81 | HR 70 | Temp 98.1°F | Resp 16 | Ht 68.0 in | Wt 174.5 lb

## 2013-03-06 DIAGNOSIS — K648 Other hemorrhoids: Secondary | ICD-10-CM | POA: Diagnosis present

## 2013-03-06 DIAGNOSIS — Z01812 Encounter for preprocedural laboratory examination: Secondary | ICD-10-CM | POA: Insufficient documentation

## 2013-03-06 DIAGNOSIS — N201 Calculus of ureter: Secondary | ICD-10-CM | POA: Insufficient documentation

## 2013-03-06 DIAGNOSIS — Z01818 Encounter for other preprocedural examination: Secondary | ICD-10-CM | POA: Insufficient documentation

## 2013-03-06 DIAGNOSIS — R1033 Periumbilical pain: Secondary | ICD-10-CM | POA: Insufficient documentation

## 2013-03-06 DIAGNOSIS — T8189XA Other complications of procedures, not elsewhere classified, initial encounter: Secondary | ICD-10-CM | POA: Diagnosis present

## 2013-03-06 DIAGNOSIS — K50018 Crohn's disease of small intestine with other complication: Secondary | ICD-10-CM

## 2013-03-06 DIAGNOSIS — K5090 Crohn's disease, unspecified, without complications: Secondary | ICD-10-CM | POA: Diagnosis not present

## 2013-03-06 DIAGNOSIS — K5000 Crohn's disease of small intestine without complications: Secondary | ICD-10-CM | POA: Insufficient documentation

## 2013-03-06 DIAGNOSIS — D849 Immunodeficiency, unspecified: Secondary | ICD-10-CM | POA: Diagnosis present

## 2013-03-06 DIAGNOSIS — Z01810 Encounter for preprocedural cardiovascular examination: Secondary | ICD-10-CM | POA: Insufficient documentation

## 2013-03-06 DIAGNOSIS — R197 Diarrhea, unspecified: Secondary | ICD-10-CM

## 2013-03-06 HISTORY — DX: Other specified postprocedural states: R11.2

## 2013-03-06 HISTORY — DX: Other specified postprocedural states: Z98.890

## 2013-03-06 HISTORY — DX: Essential (primary) hypertension: I10

## 2013-03-06 LAB — BASIC METABOLIC PANEL
BUN: 13 mg/dL (ref 6–23)
Calcium: 9.4 mg/dL (ref 8.4–10.5)
Chloride: 105 mEq/L (ref 96–112)
Creatinine, Ser: 0.75 mg/dL (ref 0.50–1.10)
GFR calc Af Amer: >90 mL/min (ref >90)
GFR calc non Af Amer: >90 mL/min (ref >90)
Glucose, Bld: 91 mg/dL (ref 70–99)

## 2013-03-06 LAB — CBC
HCT: 37.1 % (ref 36.0–46.0)
MCHC: 34 g/dL (ref 30.0–36.0)
MCV: 90.9 fL (ref 78.0–100.0)
RDW: 12 % (ref 11.5–15.5)

## 2013-03-06 MED ORDER — ACETAMINOPHEN 325 MG PO TABS
650.0000 mg | ORAL_TABLET | ORAL | Status: DC
  Administered 2013-03-06: 650 mg via ORAL
  Filled 2013-03-06: qty 2

## 2013-03-06 MED ORDER — DIPHENHYDRAMINE HCL 25 MG PO CAPS
25.0000 mg | ORAL_CAPSULE | ORAL | Status: DC
  Administered 2013-03-06: 25 mg via ORAL
  Filled 2013-03-06: qty 1

## 2013-03-06 MED ORDER — SODIUM CHLORIDE 0.9 % IV SOLN
INTRAVENOUS | Status: DC
  Administered 2013-03-06: 10:00:00 via INTRAVENOUS

## 2013-03-06 MED ORDER — INFLIXIMAB 100 MG IV SOLR
5.0000 mg/kg | INTRAVENOUS | Status: DC
  Administered 2013-03-06: 400 mg via INTRAVENOUS
  Filled 2013-03-06: qty 40

## 2013-03-06 MED ORDER — SODIUM CHLORIDE 0.9 % IV SOLN: Freq: Once | INTRAVENOUS | Status: DC

## 2013-03-06 NOTE — Patient Instructions (Addendum)
[place]  03/06/2013   Your procedure is scheduled on:  03/08/13  FRIDAY  Report to [facility] at   [place]    AM.  Call this number if you have problems the morning of surgery: [phone]       Remember: [place]  Do not eat food  Or drink :After Midnight. Thursday NIGHT   Take these medicines the morning of surgery with A SIP OF WATER: Symbicort,   ATENOLOL, AMLODIPINE, PROZAC, OMEPRAZOLE,  ALPRAZOLAM MAY TAKE NORCO  Or use ALBUTEROL IF NEEDED  .  Contacts, dentures or partial plates can not be worn to surgery  Leave suitcase in the car. After surgery it may be brought to your room.  For patients admitted to the hospital, checkout time is 11:00 AM day of  discharge.             SPECIAL INSTRUCTIONS- SEE [facility] PREPARING FOR SURGERY INSTRUCTION SHEET-     DO NOT WEAR JEWELRY, LOTIONS, POWDERS, OR PERFUMES.  WOMEN-- DO NOT SHAVE LEGS OR UNDERARMS FOR 12 HOURS BEFORE SHOWERS. MEN MAY SHAVE FACE.  Patients discharged the day of surgery will not be allowed to drive home. IF going home the day of surgery, you must have a driver and someone to stay with you for the first 24 hours  Name and phone number of your driver:  Claris Che  [phone]                 [place]                                                  Patient Signature _____________________________

## 2013-03-06 NOTE — Progress Notes (Signed)
03/06/13 1325  OBSTRUCTIVE SLEEP APNEA  Have you ever been diagnosed with sleep apnea through a sleep study? No  Do you snore loudly (loud enough to be heard through closed doors)?  1  Do you often feel tired, fatigued, or sleepy during the daytime? 1  Has anyone observed you stop breathing during your sleep? 0  Do you have, or are you being treated for high blood pressure? 1  BMI more than 35 kg/m2? 0  Age over 53 years old? 1  Neck circumference greater than 40 cm/18 inches? 0  Gender: 0  Obstructive Sleep Apnea Score 4  Score 4 or greater  Results sent to PCP

## 2013-03-07 MED ORDER — GENTAMICIN SULFATE 40 MG/ML IJ SOLN
350.0000 mg | INTRAVENOUS | Status: AC
  Administered 2013-03-08: 350 mg via INTRAVENOUS
  Filled 2013-03-07: qty 8.75

## 2013-03-08 ENCOUNTER — Encounter (HOSPITAL_COMMUNITY): Payer: Self-pay | Admitting: *Deleted

## 2013-03-08 ENCOUNTER — Ambulatory Visit (HOSPITAL_COMMUNITY): Payer: Commercial Indemnity | Admitting: Anesthesiology

## 2013-03-08 ENCOUNTER — Ambulatory Visit (HOSPITAL_COMMUNITY)
Admission: RE | Admit: 2013-03-08 | Discharge: 2013-03-08 | Disposition: A | Discharge status: Home / Self Care | Payer: Commercial Indemnity | Source: Ambulatory Visit | Attending: Urology | Admitting: Urology

## 2013-03-08 ENCOUNTER — Encounter (HOSPITAL_COMMUNITY)
Admission: RE | Disposition: A | Discharge status: Home / Self Care | Payer: Self-pay | Source: Ambulatory Visit | Attending: Urology

## 2013-03-08 ENCOUNTER — Encounter (HOSPITAL_COMMUNITY): Payer: Commercial Indemnity | Admitting: Anesthesiology

## 2013-03-08 DIAGNOSIS — K56609 Unspecified intestinal obstruction, unspecified as to partial versus complete obstruction: Secondary | ICD-10-CM | POA: Diagnosis not present

## 2013-03-08 DIAGNOSIS — K449 Diaphragmatic hernia without obstruction or gangrene: Secondary | ICD-10-CM | POA: Diagnosis not present

## 2013-03-08 DIAGNOSIS — Z9049 Acquired absence of other specified parts of digestive tract: Secondary | ICD-10-CM | POA: Diagnosis not present

## 2013-03-08 DIAGNOSIS — N201 Calculus of ureter: Secondary | ICD-10-CM | POA: Insufficient documentation

## 2013-03-08 DIAGNOSIS — J45909 Unspecified asthma, uncomplicated: Secondary | ICD-10-CM | POA: Insufficient documentation

## 2013-03-08 DIAGNOSIS — K219 Gastro-esophageal reflux disease without esophagitis: Secondary | ICD-10-CM | POA: Insufficient documentation

## 2013-03-08 DIAGNOSIS — Z79899 Other long term (current) drug therapy: Secondary | ICD-10-CM | POA: Diagnosis not present

## 2013-03-08 DIAGNOSIS — I10 Essential (primary) hypertension: Secondary | ICD-10-CM | POA: Insufficient documentation

## 2013-03-08 DIAGNOSIS — K5090 Crohn's disease, unspecified, without complications: Secondary | ICD-10-CM | POA: Insufficient documentation

## 2013-03-08 DIAGNOSIS — Z87891 Personal history of nicotine dependence: Secondary | ICD-10-CM | POA: Diagnosis not present

## 2013-03-08 DIAGNOSIS — N200 Calculus of kidney: Secondary | ICD-10-CM | POA: Diagnosis not present

## 2013-03-08 HISTORY — PX: CYSTOSCOPY WITH RETROGRADE PYELOGRAM, URETEROSCOPY AND STENT PLACEMENT: SHX5789

## 2013-03-08 SURGERY — CYSTOURETEROSCOPY, WITH RETROGRADE PYELOGRAM AND STENT INSERTION
Anesthesia: General | Laterality: Left

## 2013-03-08 MED ORDER — SENNOSIDES-DOCUSATE SODIUM 8.6-50 MG PO TABS: 1.0000 | ORAL_TABLET | Freq: Two times a day (BID) | ORAL | Status: DC

## 2013-03-08 MED ORDER — KETOROLAC TROMETHAMINE 30 MG/ML IJ SOLN
15.0000 mg | Freq: Once | INTRAMUSCULAR | Status: AC | PRN
  Administered 2013-03-08: 30 mg via INTRAVENOUS

## 2013-03-08 MED ORDER — MIDAZOLAM HCL 5 MG/5ML IJ SOLN
INTRAMUSCULAR | Status: DC | PRN
  Administered 2013-03-08: 2 mg via INTRAVENOUS

## 2013-03-08 MED ORDER — EPHEDRINE SULFATE 50 MG/ML IJ SOLN
INTRAMUSCULAR | Status: AC
Start: 2013-03-08 — End: 2013-03-08
  Filled 2013-03-08: qty 1

## 2013-03-08 MED ORDER — ONDANSETRON HCL 4 MG/2ML IJ SOLN
INTRAMUSCULAR | Status: AC
  Filled 2013-03-08: qty 2

## 2013-03-08 MED ORDER — DEXAMETHASONE SODIUM PHOSPHATE 10 MG/ML IJ SOLN
INTRAMUSCULAR | Status: DC | PRN
  Administered 2013-03-08: 10 mg via INTRAVENOUS

## 2013-03-08 MED ORDER — SCOPOLAMINE 1 MG/3DAYS TD PT72
MEDICATED_PATCH | TRANSDERMAL | Status: AC
  Filled 2013-03-08: qty 1

## 2013-03-08 MED ORDER — DEXAMETHASONE SODIUM PHOSPHATE 10 MG/ML IJ SOLN
INTRAMUSCULAR | Status: AC
  Filled 2013-03-08: qty 1

## 2013-03-08 MED ORDER — LIDOCAINE HCL (CARDIAC) 20 MG/ML IV SOLN
INTRAVENOUS | Status: AC
  Filled 2013-03-08: qty 5

## 2013-03-08 MED ORDER — ONDANSETRON HCL 4 MG/2ML IJ SOLN
INTRAMUSCULAR | Status: DC | PRN
  Administered 2013-03-08: 4 mg via INTRAVENOUS

## 2013-03-08 MED ORDER — IOHEXOL 300 MG/ML  SOLN
INTRAMUSCULAR | Status: DC | PRN
Start: 2013-03-08 — End: 2013-03-08
  Administered 2013-03-08: 10 mL via URETHRAL

## 2013-03-08 MED ORDER — FENTANYL CITRATE 0.05 MG/ML IJ SOLN
INTRAMUSCULAR | Status: AC
  Filled 2013-03-08: qty 2

## 2013-03-08 MED ORDER — SODIUM CHLORIDE 0.9 % IJ SOLN
INTRAMUSCULAR | Status: AC
  Filled 2013-03-08: qty 10

## 2013-03-08 MED ORDER — FENTANYL CITRATE 0.05 MG/ML IJ SOLN
25.0000 ug | INTRAMUSCULAR | Status: DC | PRN
  Administered 2013-03-08 (×2): 50 ug via INTRAVENOUS

## 2013-03-08 MED ORDER — SULFAMETHOXAZOLE-TRIMETHOPRIM 400-80 MG PO TABS
1.0000 | ORAL_TABLET | Freq: Two times a day (BID) | ORAL | Status: DC
  Filled 2013-03-08 (×4): qty 1

## 2013-03-08 MED ORDER — SULFAMETHOXAZOLE-TRIMETHOPRIM 400-80 MG PO TABS: 1.0000 | ORAL_TABLET | Freq: Two times a day (BID) | ORAL | Status: DC

## 2013-03-08 MED ORDER — LACTATED RINGERS IV SOLN
INTRAVENOUS | Status: DC | PRN
  Administered 2013-03-08: 07:00:00 via INTRAVENOUS

## 2013-03-08 MED ORDER — HYDROCODONE-ACETAMINOPHEN 10-325 MG PO TABS
1.0000 | ORAL_TABLET | ORAL | Status: DC | PRN
  Administered 2013-03-08: 1 via ORAL
  Filled 2013-03-08: qty 1

## 2013-03-08 MED ORDER — EPHEDRINE SULFATE 50 MG/ML IJ SOLN
INTRAMUSCULAR | Status: DC | PRN
  Administered 2013-03-08 (×2): 5 mg via INTRAVENOUS

## 2013-03-08 MED ORDER — 0.9 % SODIUM CHLORIDE (POUR BTL) OPTIME
TOPICAL | Status: DC | PRN
  Administered 2013-03-08: 1000 mL

## 2013-03-08 MED ORDER — PROPOFOL 10 MG/ML IV BOLUS
INTRAVENOUS | Status: AC
  Filled 2013-03-08: qty 20

## 2013-03-08 MED ORDER — FENTANYL CITRATE 0.05 MG/ML IJ SOLN
INTRAMUSCULAR | Status: DC | PRN
  Administered 2013-03-08 (×4): 25 ug via INTRAVENOUS

## 2013-03-08 MED ORDER — KETOROLAC TROMETHAMINE 30 MG/ML IJ SOLN
INTRAMUSCULAR | Status: AC
  Filled 2013-03-08: qty 1

## 2013-03-08 MED ORDER — SCOPOLAMINE 1 MG/3DAYS TD PT72
MEDICATED_PATCH | TRANSDERMAL | Status: DC | PRN
  Administered 2013-03-08: 1 via TRANSDERMAL

## 2013-03-08 MED ORDER — EPINEPHRINE HCL 0.1 MG/ML IJ SOSY
PREFILLED_SYRINGE | INTRAMUSCULAR | Status: AC
  Filled 2013-03-08: qty 10

## 2013-03-08 MED ORDER — PROMETHAZINE HCL 25 MG/ML IJ SOLN: 6.2500 mg | INTRAMUSCULAR | Status: DC | PRN

## 2013-03-08 MED ORDER — HYDROCODONE-ACETAMINOPHEN 10-325 MG PO TABS: 1.0000 | ORAL_TABLET | ORAL | Status: DC | PRN

## 2013-03-08 MED ORDER — MIDAZOLAM HCL 2 MG/2ML IJ SOLN
INTRAMUSCULAR | Status: AC
  Filled 2013-03-08: qty 2

## 2013-03-08 MED ORDER — SODIUM CHLORIDE 0.9 % IR SOLN
Status: DC | PRN
  Administered 2013-03-08: 1000 mL

## 2013-03-08 MED ORDER — LACTATED RINGERS IV SOLN: INTRAVENOUS | Status: DC

## 2013-03-08 MED ORDER — SODIUM CHLORIDE 0.9 % IR SOLN
Status: DC | PRN
  Administered 2013-03-08: 3000 mL

## 2013-03-08 MED ORDER — ATENOLOL 25 MG PO TABS
25.0000 mg | ORAL_TABLET | Freq: Every day | ORAL | Status: DC
Start: 2013-03-08 — End: 2013-03-08
  Administered 2013-03-08: 25 mg via ORAL
  Filled 2013-03-08: qty 1

## 2013-03-08 MED ORDER — LIDOCAINE HCL (CARDIAC) 20 MG/ML IV SOLN
INTRAVENOUS | Status: DC | PRN
  Administered 2013-03-08: 50 mg via INTRAVENOUS

## 2013-03-08 MED ORDER — PROPOFOL 10 MG/ML IV BOLUS
INTRAVENOUS | Status: DC | PRN
  Administered 2013-03-08: 200 mg via INTRAVENOUS

## 2013-03-08 SURGICAL SUPPLY — 23 items
BAG URINE DRAINAGE (UROLOGICAL SUPPLIES) ×2 IMPLANT
BASKET LASER NITINOL 1.9FR (BASKET) IMPLANT
BASKET STNLS GEMINI 4WIRE 3FR (BASKET) IMPLANT
BASKET STONE NITINOL 3FRX115MB (UROLOGICAL SUPPLIES) ×2 IMPLANT
BASKET ZERO TIP NITINOL 2.4FR (BASKET) IMPLANT
CATH INTERMIT  6FR 70CM (CATHETERS) ×2 IMPLANT
DRAPE CAMERA CLOSED 9X96 (DRAPES) ×2 IMPLANT
ELECT REM PT RETURN 9FT ADLT (ELECTROSURGICAL)
ELECTRODE REM PT RTRN 9FT ADLT (ELECTROSURGICAL) IMPLANT
FIBER LASER FLEXIVA 200 (UROLOGICAL SUPPLIES) IMPLANT
FIBER LASER FLEXIVA 365 (UROLOGICAL SUPPLIES) IMPLANT
GLOVE BIOGEL M STRL SZ7.5 (GLOVE) ×2 IMPLANT
GOWN PREVENTION PLUS LG XLONG (DISPOSABLE) ×2 IMPLANT
GUIDEWIRE ANG ZIPWIRE 035X150 (WIRE) ×2 IMPLANT
GUIDEWIRE ANG ZIPWIRE 038X150 (WIRE) ×2 IMPLANT
GUIDEWIRE STR DUAL SENSOR (WIRE) ×2 IMPLANT
IV NS IRRIG 3000ML ARTHROMATIC (IV SOLUTION) ×2 IMPLANT
PACK CYSTO (CUSTOM PROCEDURE TRAY) ×2 IMPLANT
SHEATH ACCESS URETERAL 24CM (SHEATH) ×2 IMPLANT
STENT POLARIS 5FRX24 (STENTS) ×2 IMPLANT
SYRINGE 10CC LL (SYRINGE) ×2 IMPLANT
SYRINGE IRR TOOMEY STRL 70CC (SYRINGE) IMPLANT
TUBE FEEDING 8FR 16IN STR KANG (MISCELLANEOUS) ×2 IMPLANT

## 2013-03-08 NOTE — Discharge Instructions (Signed)
1 - You may have urinary urgency (bladder spasms) and bloody urine on / off with stent in place. This is normal.  2 - Call MD or go to ER for fever >102, severe pain / nausea / vomiting not relieved by medications, or acute change in medical status  3 - Remove tethered stent on Monday morning at home by pulling string, then blue-white plastic tubing, and discarding. Dr. Tresa Moore is in the office that day if any issues arise.

## 2013-03-08 NOTE — Transfer of Care (Signed)
Immediate Anesthesia Transfer of Care Note  Patient: [name]  Procedure(s) Performed: Procedure(s) (LRB): CYSTOSCOPY WITH RETROGRADE PYELOGRAM, URETEROSCOPY AND STENT PLACEMENT stone basketing (Left)  Patient Location: PACU  Anesthesia Type: General  Level of Consciousness: sedated, patient cooperative and responds to stimulation  Airway & Oxygen Therapy: Patient Spontanous Breathing and Patient connected to face mask oxgen  Post-op Assessment: Report given to PACU RN and Post -op Vital signs reviewed and stable  Post vital signs: Reviewed and stable  Complications: No apparent anesthesia complications

## 2013-03-08 NOTE — Anesthesia Preprocedure Evaluation (Addendum)
Anesthesia Evaluation  Patient identified by MRN, date of birth, ID band Patient awake    Reviewed: Allergy & Precautions, H&P , NPO status , Patient's Chart, lab work & pertinent test results  Airway Mallampati: II TM Distance: >3 FB Neck ROM: Full    Dental  (+) Edentulous Lower and Edentulous Upper   Pulmonary neg pulmonary ROS, former smoker,  breath sounds clear to auscultation  Pulmonary exam normal       Cardiovascular hypertension, negative cardio ROS  Rhythm:Regular Rate:Normal     Neuro/Psych negative neurological ROS  negative psych ROS   GI/Hepatic Neg liver ROS, GERD-  ,  Endo/Other  negative endocrine ROS  Renal/GU negative Renal ROS  negative genitourinary   Musculoskeletal negative musculoskeletal ROS (+)   Abdominal   Peds negative pediatric ROS (+)  Hematology negative hematology ROS (+)   Anesthesia Other Findings   Reproductive/Obstetrics negative OB ROS                          Anesthesia Physical Anesthesia Plan  ASA: II  Anesthesia Plan: General   Post-op Pain Management:    Induction: Intravenous  Airway Management Planned: LMA  Additional Equipment:   Intra-op Plan:   Post-operative Plan:   Informed Consent: I have reviewed the patients History and Physical, chart, labs and discussed the procedure including the risks, benefits and alternatives for the proposed anesthesia with the patient or authorized representative who has indicated his/her understanding and acceptance.   Dental advisory given  Plan Discussed with: CRNA and Surgeon  Anesthesia Plan Comments:         Anesthesia Quick Evaluation

## 2013-03-08 NOTE — Op Note (Signed)
[name], [name]            ACCOUNT NO.:  [id number]  MEDICAL RECORD NO.:  [id number]  LOCATION:  [facility]                         FACILITY:  [facility]  PHYSICIAN:  Alexis Frock, MD     DATE OF BIRTH:  11-Oct-1960  DATE OF PROCEDURE: 03/08/2013 DATE OF DISCHARGE:                              OPERATIVE REPORT   DIAGNOSIS:  Left ureteral stone.  PROCEDURE: 1. Cystoscopy with left retrograde pyelogram and interpretation. 2. Left ureteroscopy with basketing of stone. 3. Left ureteral stent placement, 5 x 24, with tether to the thigh.  ESTIMATED BLOOD LOSS:  Nil.  COMPLICATIONS:  None.  SPECIMEN:  Left ureteral stone for compositional analysis.  FINDINGS: 1. Area of left distal ureteral inflammation consistent with prior     stone site. 2. Small volume left intrarenal stone, several 1 to 2 mm fragments,     likely consistent with retrograde displacement of previous distal     ureteral stone. 3. No evidence of ureteral duplication on the left. 4. Phlebolith, inferior and lateral to the course of the ureter by x-     ray.  This was corroborated by CT scan in the same location.  INDICATION:  Ms. Chojnowski is a pleasant 53 year old lady, was here for first episode of renal colic in July of 4259 from a small distal ureteral stone.  Her symptoms subsided, therefore, she thought she had passed the stone.  However, she did not definitively isolate the specimen.  Earlier this week, she had recurrence of colicky left flank pain as well as frequency, urgency, and irritative voiding symptoms. Her urine culture was negative.  This is consistent with likely retained distal stone.  A CT scan corroborated this.  Options were discussed including observation with medical therapy versus shockwave lithotripsy versus ureteroscopy, and she wished to proceed with the latter. Informed consent was obtained and placed in medical record.  PROCEDURE IN DETAIL:  The patient being [name]  verified, procedure being left ureteroscopic stone manipulation was confirmed. Procedure was carried out.  Time-out was performed.  Intravenous antibiotics were administered.  General LMA anesthesia was introduced. The patient was placed into a low lithotomy position.  Sterile field was created by prepping and draping the patient's vagina, introitus, and proximal thighs using iodine x3.  Next, cystourethroscopy was performed using a 22-French rigid cystoscope with 12-degree offset lens. Inspection of the urinary bladder revealed no diverticula, calcifications, papular lesions.  There appeared to be 2 singleton ureteral orifices.  There were some periureteral edema around the left distal ureter consistent likely with previous stone.  The left ureteral orifice was cannulated with a 6-French end-hole catheter and left retrograde pyelogram was obtained.  Left retrograde pyelogram demonstrated a single left ureter with single system left kidney.  There was no obvious large filling defect in the distal ureter or ureter whatsoever and there was no significant hydroureteronephrosis on the left.  A 0.038 Glidewire was advanced at the level of the upper pole and set aside as a safety wire.  Notably, retrograde pyelogram demonstrated normal appearance of the kidney without drooping lily sign or any abnormalities consistent with possible duplication.  Next, semi-rigid ureteroscopy was performed in the distal two-thirds of the left  ureter alongside a separate Sensor working wire with an 8-French feeding tube in urinary bladder for pressure release. This did reveal an area of mucosal edema and irritation in the distal left ureter, consistent with likely site of prior stone impaction. There was no stone at this side.  There was no stone in the distal two- thirds of the ureter.  It was felt that this could represent the recent passage of stone versus retrograde displacement of small fragments.  I felt  that flexible ureteroscopy was clearly warranted.  As such, the semi-rigid ureteroscope was exchanged for a 12/14 24-cm ureteral access sheath which was placed under continuous fluoroscopic vision to the level of the mid ureter.  Next, flexible digital ureteroscopy was performed using 8-French digital ureteroscope.  Inspection of the proximal ureter revealed no mucosal abnormalities, calcifications. Systematic inspection of each calyx x2 revealed several small stones, mostly in the mid and lower pole calyces.  There were 2 dominant areas each of which had very small stone approximately 1 to 2 mm, these were grasped with an escape basket, brought out in their entirety, set aside for a compositional analysis.  Repeat systematic inspection of the entire left kidney x2 revealed no mucosal abnormalities or residual calcifications larger than 1/3 mm.  The access sheath was removed under continuous ureteroscopic vision.  No mucosal abnormalities were found. Finally, a new 5 x 24 Polaris type stent was placed with remaining safety wire.  Using fluoroscopic guidance, good proximal and distal deployment were noted.  There appeared on spot images to be a calcification lateral and distal to the course of the distal ureter. Given the relatively unimpressive findings on ureteroscopy, we once again reviewed the recent CT images and indeed there was found to be a phlebolith corresponding to this location and this was not felt to represent any evidence of retained distal ureteral stone.  Also cystoscopically, this area was very carefully inspected around the known area of distal ureter as the stent was in place, and no additional ureteral orifices were found with very careful inspection and cannulation attempts with Glidewire.  It was felt that we had achieved the goals of surgery today.  Bladder was emptied per cystoscope. Procedure was then terminated.  The patient tolerated the procedure well.  There  were no immediate periprocedural complications.  The patient was taken to the Postanesthesia Care Unit in stable condition.          ______________________________ Alexis Frock, MD     TM/MEDQ  D:  03/08/2013  T:  03/08/2013  Job:  [id number]

## 2013-03-08 NOTE — Brief Op Note (Signed)
03/08/2013  8:16 AM  PATIENT:  [name]  53 y.o. female  PRE-OPERATIVE DIAGNOSIS:  Left Ureteral Stone  POST-OPERATIVE DIAGNOSIS:  Left Ureteral Stone  PROCEDURE:  Procedure(s): CYSTOSCOPY WITH RETROGRADE PYELOGRAM, URETEROSCOPY AND STENT PLACEMENT stone basketing (Left)  SURGEON:  Surgeon(s) and Role:    * Alexis Frock, MD - Primary  PHYSICIAN ASSISTANT:   ASSISTANTS: none   ANESTHESIA:   general  EBL:     BLOOD ADMINISTERED:none  DRAINS: none   LOCAL MEDICATIONS USED:  NONE  SPECIMEN:  Source of Specimen:  Left ureteral stone  DISPOSITION OF SPECIMEN:  [facility] for compositional analysis  COUNTS:  YES  TOURNIQUET:  * No tourniquets in log *  DICTATION: .Other Dictation: Dictation Number [id number]  PLAN OF CARE: Discharge to home after PACU  PATIENT DISPOSITION:  PACU - hemodynamically stable.   Delay start of Pharmacological VTE agent (>24hrs) due to surgical blood loss or risk of bleeding: not applicable

## 2013-03-08 NOTE — H&P (Signed)
[name] is an 53 y.o. female.    Chief Complaint: Pre-Op Left Ureteroscopic Stone Manipulation  HPI:   1 - Nephrolithiasis - Pt with small left 76m UVJ sotne by ER CT 09/2012. No additional stones at that time. No prior stones. Thought she may have passed the stone, but not sure, and  now with 3 weeks of worsening left groin pain and urinary freqency / urgency. No fevers. CT this week confirms left ureteral stone still at UVJ and now 632mor so, no additional stones.   PMH sig for Chron's (on imuran, remicade), SBO/stricture / small bowel resection x2, lap chole.   Today [facility] seen to proceed with left ureteroscopic stone manipulation. She is very stable on her immune modulators w/o h/o recurrent infections. Most recent UCX negative.    Past Medical History  Diagnosis Date  . Crohn's disease   . Depression   . Hiatal hernia   . Adenomatous colon polyp   . GERD (gastroesophageal reflux disease)   . Vitamin B12 deficiency   . SBO (small bowel obstruction)     partial secondary to crohns stricture  . Psoriasis   . Gastritis   . Family history of malignant neoplasm of gastrointestinal tract   . Crohn's colitis   . Infection of eye region right  . Kidney stones   . Esophagitis   . Esophageal stricture   . PONV (postoperative nausea and vomiting)   . Hypertension   . Asthma   . History of blood transfusion     Past Surgical History  Procedure Laterality Date  . Partial hysterectomy    . Ileocecectomy  10/2008  . Carpal tunnel release      right  . Anal fissure repair    . Cholecystectomy    . Colon surgery      lap assisted ileocecectomy for crohns  . Colon surgery      right hemicolectomy for stricture    Family History  Problem Relation Age of Onset  . Colon cancer Mother   . Breast cancer Paternal Grandmother   . Colon polyps Mother   . Colon polyps Father   . Colon polyps Brother    Social History:  reports that she quit smoking about 29 years ago.  Her smoking use included Cigarettes. She has a 10 pack-year smoking history. She has never used smokeless tobacco. She reports that she does not drink alcohol or use illicit drugs.  Allergies:  Allergies  Allergen Reactions  . Codeine Hives and Nausea And Vomiting    Medications Prior to Admission  Medication Sig Dispense Refill  . ALPRAZolam (XANAX) 0.5 MG tablet Take 0.5 mg by mouth 2 (two) times daily as needed for anxiety.       . Marland KitchenmLODipine (NORVASC) 10 MG tablet Take 1 tablet by mouth every morning.       . Marland Kitchentenolol (TENORMIN) 25 MG tablet Take 25 mg by mouth every morning.       . Marland KitchenzaTHIOprine (IMURAN) 50 MG tablet Take 100 mg by mouth every morning.      . budesonide (ENTOCORT EC) 3 MG 24 hr capsule Take 9 mg by mouth daily.      . clotrimazole-betamethasone (LOTRISONE) cream Apply 1 application topically 4 (four) times daily as needed (itching).      . colestipol (COLESTID) 1 G tablet Take 2 g by mouth 2 (two) times daily.      . diclofenac (VOLTAREN) 75 MG EC tablet Take 75  mg by mouth daily.      Marland Kitchen dicyclomine (BENTYL) 20 MG tablet Take 20 mg by mouth 3 (three) times daily.      . diphenoxylate-atropine (LOMOTIL) 2.5-0.025 MG per tablet Take 1 tablet by mouth 4 (four) times daily as needed for diarrhea or loose stools.      . eszopiclone (LUNESTA) 2 MG TABS Take 2 mg by mouth at bedtime as needed. Take immediately before bedtime       . fluconazole (DIFLUCAN) 100 MG tablet Take 100 mg by mouth daily.      Marland Kitchen FLUoxetine (PROZAC) 20 MG capsule Take 20 mg by mouth every morning.       . fluticasone (FLONASE) 50 MCG/ACT nasal spray Place 1 spray into both nostrils 2 (two) times daily as needed for allergies or rhinitis.      Marland Kitchen HYDROcodone-acetaminophen (NORCO) 10-325 MG per tablet Take 1 tablet by mouth every 4 (four) hours as needed for moderate pain or severe pain.      Marland Kitchen ibuprofen (ADVIL,MOTRIN) 600 MG tablet Take 600 mg by mouth every 6 (six) hours as needed.      . loperamide  (IMODIUM) 2 MG capsule Take 4 mg by mouth 2 (two) times daily.       Marland Kitchen omeprazole (PRILOSEC) 20 MG capsule Take 20 mg by mouth 2 (two) times daily before a meal.      . potassium chloride SA (K-DUR,KLOR-CON) 20 MEQ tablet Take 20 mEq by mouth daily.      . pramoxine-hydrocortisone (ANALPRAM HC) cream Apply 1 application topically 4 (four) times daily.      . predniSONE (DELTASONE) 10 MG tablet Take 10 mg by mouth daily.       . promethazine (PHENERGAN) 25 MG suppository Place 25 mg rectally every 6 (six) hours as needed for nausea or vomiting.      Marland Kitchen albuterol (PROAIR HFA) 108 (90 BASE) MCG/ACT inhaler Inhale 2 puffs into the lungs every 4 (four) hours as needed for wheezing or shortness of breath.       . Alum & Mag Hydroxide-Simeth (MAGIC MOUTHWASH) SOLN Take 5 mLs by mouth 3 (three) times daily as needed for mouth pain.       . budesonide-formoterol (SYMBICORT) 80-4.5 MCG/ACT inhaler Inhale 2 puffs into the lungs 2 (two) times daily as needed (wheezing).      . cyclobenzaprine (FLEXERIL) 10 MG tablet Take 10 mg by mouth at bedtime as needed for muscle spasms.      . fenofibrate 160 MG tablet Take 160 mg by mouth every morning.       Marland Kitchen ibuprofen (ADVIL,MOTRIN) 600 MG tablet Take 600 mg by mouth every 6 (six) hours as needed.      . inFLIXimab (REMICADE) 100 MG injection Inject 10 mg/kg into the vein every 6 (six) weeks.       . promethazine (PHENERGAN) 25 MG tablet Take 25 mg by mouth every 6 (six) hours as needed for nausea or vomiting.        Results for orders placed during the hospital encounter of 03/06/13 (from the past 48 hour(s))  CBC     Status: None   Collection Time    03/06/13  1:50 PM      Result Value Range   WBC 7.4  4.0 - 10.5 K/uL   RBC 4.08  3.87 - 5.11 MIL/uL   Hemoglobin 12.6  12.0 - 15.0 g/dL   HCT 37.1  36.0 - 46.0 %  MCV 90.9  78.0 - 100.0 fL   MCH 30.9  26.0 - 34.0 pg   MCHC 34.0  30.0 - 36.0 g/dL   RDW 12.0  11.5 - 15.5 %   Platelets 198  150 - 400 K/uL   BASIC METABOLIC PANEL     Status: Abnormal   Collection Time    03/06/13  1:50 PM      Result Value Range   Sodium 140  137 - 147 mEq/L   Comment: Please note change in reference range.   Potassium 3.6 (*) 3.7 - 5.3 mEq/L   Comment: Please note change in reference range.   Chloride 105  96 - 112 mEq/L   CO2 25  19 - 32 mEq/L   Glucose, Bld 91  70 - 99 mg/dL   BUN 13  6 - 23 mg/dL   Creatinine, Ser 0.75  0.50 - 1.10 mg/dL   Calcium 9.4  8.4 - 10.5 mg/dL   GFR calc non Af Amer >90  >90 mL/min   GFR calc Af Amer >90  >90 mL/min   Comment: (NOTE)     The eGFR has been calculated using the CKD EPI equation.     This calculation has not been validated in all clinical situations.     eGFR's persistently <90 mL/min signify possible Chronic Kidney     Disease.   Dg Chest 2 View  03/06/2013   CLINICAL DATA:  Preop for cystoscopy  EXAM: CHEST  2 VIEW  COMPARISON:  12/21/2012  FINDINGS: Cardiomediastinal silhouette is stable. No acute infiltrate or pleural effusion. No pulmonary edema. Bony thorax is unremarkable.  IMPRESSION: No active cardiopulmonary disease.   Electronically Signed   By: Lahoma Crocker M.D.   On: 03/06/2013 14:33    Review of Systems  Constitutional: Negative.  Negative for fever and chills.  HENT: Negative.   Eyes: Negative.   Respiratory: Negative.   Cardiovascular: Negative.   Gastrointestinal: Negative.   Genitourinary: Positive for urgency, frequency and flank pain. Negative for dysuria and hematuria.  Musculoskeletal: Negative.   Skin: Negative.   Neurological: Negative.   Endo/Heme/Allergies: Negative.   Psychiatric/Behavioral: Negative.     Blood pressure 167/99, pulse 89, temperature 97.8 F (36.6 C), temperature source Oral, resp. rate 18, SpO2 100.00%. Physical Exam  Constitutional: She is oriented to person, place, and time. She appears well-developed and well-nourished.  HENT:  Head: Normocephalic and atraumatic.  Eyes: Pupils are equal, round, and  reactive to light.  Neck: Normal range of motion. Neck supple.  Cardiovascular: Normal rate.   Respiratory: Effort normal.  GI: Soft. Bowel sounds are normal.  Genitourinary:  Mild left CVAT  Musculoskeletal: Normal range of motion.  Neurological: She is alert and oriented to person, place, and time.  Skin: Skin is warm and dry.  Psychiatric: She has a normal mood and affect. Her behavior is normal. Judgment and thought content normal.     Assessment/Plan  1 - Nephrolithiasis - left ureteral stone persistant and acutally TRIPLED in size over past few mos.  We rediscussed ureteroscopic stone manipulation with basketing and laser-lithotripsy in detail.  We rediscussed risks including bleeding, infection, damage to kidney / ureter  bladder, rarely loss of kidney. We rediscussed anesthetic risks and rare but serious surgical complications including DVT, PE, MI, and mortality. We specifically addressed that in 5-10% of cases a staged approach is required with stenting followed by re-attempt ureteroscopy if anatomy unfavorable. The patient voiced understanding and wises to proceed today  as planned.    Bobi Daudelin 03/08/2013, 6:46 AM

## 2013-03-08 NOTE — Anesthesia Postprocedure Evaluation (Signed)
  Anesthesia Post-op Note  Patient: [name]  Procedure(s) Performed: Procedure(s) (LRB): CYSTOSCOPY WITH RETROGRADE PYELOGRAM, URETEROSCOPY AND STENT PLACEMENT stone basketing (Left)  Patient Location: PACU  Anesthesia Type: General  Level of Consciousness: awake and alert   Airway and Oxygen Therapy: Patient Spontanous Breathing  Post-op Pain: mild  Post-op Assessment: Post-op Vital signs reviewed, Patient's Cardiovascular Status Stable, Respiratory Function Stable, Patent Airway and No signs of Nausea or vomiting  Last Vitals:  Filed Vitals:   03/08/13 0900  BP: 145/101  Pulse: 69  Temp:   Resp: 12    Post-op Vital Signs: stable   Complications: No apparent anesthesia complications

## 2013-03-08 NOTE — Addendum Note (Signed)
Addendum created 03/08/13 1040 by Anne Fu, CRNA   Modules edited: Anesthesia Medication Administration

## 2013-03-11 ENCOUNTER — Encounter (HOSPITAL_COMMUNITY): Payer: Self-pay | Admitting: Urology

## 2013-03-29 ENCOUNTER — Emergency Department (HOSPITAL_COMMUNITY): Payer: 59

## 2013-03-29 ENCOUNTER — Emergency Department (HOSPITAL_COMMUNITY)
Admission: EM | Admit: 2013-03-29 | Discharge: 2013-03-29 | Disposition: A | Discharge status: Home / Self Care | Payer: 59 | Attending: Emergency Medicine | Admitting: Emergency Medicine

## 2013-03-29 ENCOUNTER — Encounter (HOSPITAL_COMMUNITY): Payer: Self-pay | Admitting: Emergency Medicine

## 2013-03-29 DIAGNOSIS — Z872 Personal history of diseases of the skin and subcutaneous tissue: Secondary | ICD-10-CM | POA: Diagnosis not present

## 2013-03-29 DIAGNOSIS — Z9889 Other specified postprocedural states: Secondary | ICD-10-CM | POA: Insufficient documentation

## 2013-03-29 DIAGNOSIS — Z860100 Personal history of colon polyps, unspecified: Secondary | ICD-10-CM | POA: Insufficient documentation

## 2013-03-29 DIAGNOSIS — N200 Calculus of kidney: Secondary | ICD-10-CM | POA: Diagnosis not present

## 2013-03-29 DIAGNOSIS — K219 Gastro-esophageal reflux disease without esophagitis: Secondary | ICD-10-CM | POA: Diagnosis not present

## 2013-03-29 DIAGNOSIS — J45909 Unspecified asthma, uncomplicated: Secondary | ICD-10-CM | POA: Diagnosis not present

## 2013-03-29 DIAGNOSIS — Z792 Long term (current) use of antibiotics: Secondary | ICD-10-CM | POA: Insufficient documentation

## 2013-03-29 DIAGNOSIS — Z79899 Other long term (current) drug therapy: Secondary | ICD-10-CM | POA: Diagnosis not present

## 2013-03-29 DIAGNOSIS — F3289 Other specified depressive episodes: Secondary | ICD-10-CM | POA: Insufficient documentation

## 2013-03-29 DIAGNOSIS — Z87891 Personal history of nicotine dependence: Secondary | ICD-10-CM | POA: Diagnosis not present

## 2013-03-29 DIAGNOSIS — I10 Essential (primary) hypertension: Secondary | ICD-10-CM | POA: Diagnosis not present

## 2013-03-29 DIAGNOSIS — K5010 Crohn's disease of large intestine without complications: Secondary | ICD-10-CM | POA: Insufficient documentation

## 2013-03-29 DIAGNOSIS — F329 Major depressive disorder, single episode, unspecified: Secondary | ICD-10-CM | POA: Diagnosis not present

## 2013-03-29 DIAGNOSIS — Z791 Long term (current) use of non-steroidal anti-inflammatories (NSAID): Secondary | ICD-10-CM | POA: Diagnosis not present

## 2013-03-29 DIAGNOSIS — Z8639 Personal history of other endocrine, nutritional and metabolic disease: Secondary | ICD-10-CM | POA: Insufficient documentation

## 2013-03-29 DIAGNOSIS — IMO0002 Reserved for concepts with insufficient information to code with codable children: Secondary | ICD-10-CM | POA: Insufficient documentation

## 2013-03-29 DIAGNOSIS — R109 Unspecified abdominal pain: Secondary | ICD-10-CM | POA: Diagnosis present

## 2013-03-29 DIAGNOSIS — Z9089 Acquired absence of other organs: Secondary | ICD-10-CM | POA: Diagnosis not present

## 2013-03-29 DIAGNOSIS — Z862 Personal history of diseases of the blood and blood-forming organs and certain disorders involving the immune mechanism: Secondary | ICD-10-CM | POA: Diagnosis not present

## 2013-03-29 LAB — CBC WITH DIFFERENTIAL/PLATELET
BASOS PCT: 0 % (ref 0–1)
Basophils Absolute: 0 10*3/uL (ref 0.0–0.1)
EOS ABS: 0 10*3/uL (ref 0.0–0.7)
Eosinophils Relative: 0 % (ref 0–5)
HEMATOCRIT: 35.4 % — AB (ref 36.0–46.0)
HEMOGLOBIN: 12.4 g/dL (ref 12.0–15.0)
LYMPHS ABS: 2.3 10*3/uL (ref 0.7–4.0)
Lymphocytes Relative: 19 % (ref 12–46)
MCH: 30.9 pg (ref 26.0–34.0)
MCHC: 35 g/dL (ref 30.0–36.0)
MCV: 88.3 fL (ref 78.0–100.0)
MONO ABS: 1.1 10*3/uL — AB (ref 0.1–1.0)
MONOS PCT: 9 % (ref 3–12)
NEUTROS ABS: 8.5 10*3/uL — AB (ref 1.7–7.7)
NEUTROS PCT: 71 % (ref 43–77)
Platelets: 170 10*3/uL (ref 150–400)
RBC: 4.01 MIL/uL (ref 3.87–5.11)
RDW: 12.2 % (ref 11.5–15.5)
WBC: 11.9 10*3/uL — ABNORMAL HIGH (ref 4.0–10.5)

## 2013-03-29 LAB — COMPREHENSIVE METABOLIC PANEL
ALBUMIN: 3.7 g/dL (ref 3.5–5.2)
ALT: 15 U/L (ref 0–35)
AST: 15 U/L (ref 0–37)
Alkaline Phosphatase: 108 U/L (ref 39–117)
BUN: 17 mg/dL (ref 6–23)
CHLORIDE: 103 meq/L (ref 96–112)
CO2: 27 mEq/L (ref 19–32)
CREATININE: 0.94 mg/dL (ref 0.50–1.10)
Calcium: 9.1 mg/dL (ref 8.4–10.5)
GFR calc Af Amer: 79 mL/min — ABNORMAL LOW (ref >90)
GFR calc non Af Amer: 68 mL/min — ABNORMAL LOW (ref >90)
GLUCOSE: 108 mg/dL — AB (ref 70–99)
Potassium: 3.3 mEq/L — ABNORMAL LOW (ref 3.7–5.3)
Sodium: 142 mEq/L (ref 137–147)
Total Bilirubin: 0.3 mg/dL (ref 0.3–1.2)
Total Protein: 7.6 g/dL (ref 6.0–8.3)

## 2013-03-29 LAB — URINALYSIS, ROUTINE W REFLEX MICROSCOPIC
Bilirubin Urine: NEGATIVE
Glucose, UA: NEGATIVE mg/dL
KETONES UR: NEGATIVE mg/dL
Leukocytes, UA: NEGATIVE
NITRITE: NEGATIVE
PROTEIN: NEGATIVE mg/dL
Specific Gravity, Urine: 1.011 (ref 1.005–1.030)
UROBILINOGEN UA: 0.2 mg/dL (ref 0.0–1.0)
pH: 6.5 (ref 5.0–8.0)

## 2013-03-29 LAB — URINE MICROSCOPIC-ADD ON

## 2013-03-29 LAB — LIPASE, BLOOD: LIPASE: 28 U/L (ref 11–59)

## 2013-03-29 MED ORDER — OXYCODONE-ACETAMINOPHEN 7.5-325 MG PO TABS: 1.0000 | ORAL_TABLET | ORAL | Status: DC | PRN

## 2013-03-29 MED ORDER — TAMSULOSIN HCL 0.4 MG PO CAPS: 0.4000 mg | ORAL_CAPSULE | Freq: Every day | ORAL | Status: DC

## 2013-03-29 MED ORDER — ONDANSETRON HCL 4 MG/2ML IJ SOLN: 4.0000 mg | Freq: Once | INTRAMUSCULAR | Status: DC

## 2013-03-29 MED ORDER — PROMETHAZINE HCL 25 MG/ML IJ SOLN
25.0000 mg | Freq: Once | INTRAMUSCULAR | Status: AC
  Administered 2013-03-29: 25 mg via INTRAVENOUS
  Filled 2013-03-29: qty 1

## 2013-03-29 MED ORDER — HYDROMORPHONE HCL PF 1 MG/ML IJ SOLN
1.0000 mg | Freq: Once | INTRAMUSCULAR | Status: AC
  Administered 2013-03-29: 1 mg via INTRAVENOUS
  Filled 2013-03-29: qty 1

## 2013-03-29 NOTE — ED Notes (Signed)
Pt escorted to discharge window. Pt verbalized understanding discharge instructions. In no acute distress.  

## 2013-03-29 NOTE — Discharge Instructions (Signed)
Kidney Stones  Kidney stones (urolithiasis) are deposits that form inside your kidneys. The intense pain is caused by the stone moving through the urinary tract. When the stone moves, the ureter goes into spasm around the stone. The stone is usually passed in the urine.   CAUSES   · A disorder that makes certain neck glands produce too much parathyroid hormone (primary hyperparathyroidism).  · A buildup of uric acid crystals, similar to gout in your joints.  · Narrowing (stricture) of the ureter.  · A kidney obstruction present at birth (congenital obstruction).  · Previous surgery on the kidney or ureters.  · Numerous kidney infections.  SYMPTOMS   · Feeling sick to your stomach (nauseous).  · Throwing up (vomiting).  · Blood in the urine (hematuria).  · Pain that usually spreads (radiates) to the groin.  · Frequency or urgency of urination.  DIAGNOSIS   · Taking a history and physical exam.  · Blood or urine tests.  · CT scan.  · Occasionally, an examination of the inside of the urinary bladder (cystoscopy) is performed.  TREATMENT   · Observation.  · Increasing your fluid intake.  · Extracorporeal shock wave lithotripsy This is a noninvasive procedure that uses shock waves to break up kidney stones.  · Surgery may be needed if you have severe pain or persistent obstruction. There are various surgical procedures. Most of the procedures are performed with the use of small instruments. Only small incisions are needed to accommodate these instruments, so recovery time is minimized.  The size, location, and chemical composition are all important variables that will determine the proper choice of action for you. Talk to your health care provider to better understand your situation so that you will minimize the risk of injury to yourself and your kidney.   HOME CARE INSTRUCTIONS   · Drink enough water and fluids to keep your urine clear or pale yellow. This will help you to pass the stone or stone fragments.  · Strain  all urine through the provided strainer. Keep all particulate matter and stones for your health care provider to see. The stone causing the pain may be as small as a grain of salt. It is very important to use the strainer each and every time you pass your urine. The collection of your stone will allow your health care provider to analyze it and verify that a stone has actually passed. The stone analysis will often identify what you can do to reduce the incidence of recurrences.  · Only take over-the-counter or prescription medicines for pain, discomfort, or fever as directed by your health care provider.  · Make a follow-up appointment with your health care provider as directed.  · Get follow-up X-rays if required. The absence of pain does not always mean that the stone has passed. It may have only stopped moving. If the urine remains completely obstructed, it can cause loss of kidney function or even complete destruction of the kidney. It is your responsibility to make sure X-rays and follow-ups are completed. Ultrasounds of the kidney can show blockages and the status of the kidney. Ultrasounds are not associated with any radiation and can be performed easily in a matter of minutes.  SEEK MEDICAL CARE IF:  · You experience pain that is progressive and unresponsive to any pain medicine you have been prescribed.  SEEK IMMEDIATE MEDICAL CARE IF:   · Pain cannot be controlled with the prescribed medicine.  · You have a fever   or shaking chills.  · The severity or intensity of pain increases over 18 hours and is not relieved by pain medicine.  · You develop a new onset of abdominal pain.  · You feel faint or pass out.  · You are unable to urinate.  MAKE SURE YOU:   · Understand these instructions.  · Will watch your condition.  · Will get help right away if you are not doing well or get worse.  Document Released: 02/21/2005 Document Revised: 10/24/2012 Document Reviewed: 07/25/2012  [employer]® Patient Information ©2014  [employer].

## 2013-03-29 NOTE — ED Notes (Signed)
md at bedside

## 2013-03-29 NOTE — ED Provider Notes (Signed)
CSN: [id number]     Arrival date & time 03/29/13  4098 History   First MD Initiated Contact with Patient 03/29/13 [phone]     Chief Complaint  Patient presents with  . Abdominal Pain   (Consider location/radiation/quality/duration/timing/severity/associated sxs/prior Treatment) Patient is a 53 y.o. female presenting with abdominal pain. The history is provided by the patient and the spouse.  Abdominal Pain  patient here complaining of three-week history of mid epigastric and left upper quadrant pain similar to her prior Crohn's disease. Notes watery diarrhea without blood. No bilious vomiting. Symptoms are persistent and nothing makes them better worse. Denies any dysuria or hematuria. She does currently take medications for Crohn's disease and has been compliant. Denies any syncope or near-syncope. But does note whole-body weakness. She denies any fever or chills.  Past Medical History  Diagnosis Date  . Crohn's disease   . Depression   . Hiatal hernia   . Adenomatous colon polyp   . GERD (gastroesophageal reflux disease)   . Vitamin B12 deficiency   . SBO (small bowel obstruction)     partial secondary to crohns stricture  . Psoriasis   . Gastritis   . Family history of malignant neoplasm of gastrointestinal tract   . Crohn's colitis   . Infection of eye region right  . Kidney stones   . Esophagitis   . Esophageal stricture   . PONV (postoperative nausea and vomiting)   . Hypertension   . Asthma   . History of blood transfusion    Past Surgical History  Procedure Laterality Date  . Partial hysterectomy    . Ileocecectomy  10/2008  . Carpal tunnel release      right  . Anal fissure repair    . Cholecystectomy    . Colon surgery      lap assisted ileocecectomy for crohns  . Colon surgery      right hemicolectomy for stricture  . Cystoscopy with retrograde pyelogram, ureteroscopy and stent placement Left 03/08/2013    Procedure: CYSTOSCOPY WITH RETROGRADE PYELOGRAM,  URETEROSCOPY AND STENT PLACEMENT stone basketing;  Surgeon: Alexis Frock, MD;  Location: [facility];  Service: Urology;  Laterality: Left;   Family History  Problem Relation Age of Onset  . Colon cancer Mother   . Breast cancer Paternal Grandmother   . Colon polyps Mother   . Colon polyps Father   . Colon polyps Brother    History  Substance Use Topics  . Smoking status: Former Smoker -- 1.00 packs/day for 10 years    Types: Cigarettes    Quit date: 03/07/1984  . Smokeless tobacco: Never Used  . Alcohol Use: No   OB History   Grav Para Term Preterm Abortions TAB SAB Ect Mult Living                 Review of Systems  Gastrointestinal: Positive for abdominal pain.  All other systems reviewed and are negative.    Allergies  Codeine  Home Medications   Current Outpatient Rx  Name  Route  Sig  Dispense  Refill  . albuterol (PROAIR HFA) 108 (90 BASE) MCG/ACT inhaler   Inhalation   Inhale 2 puffs into the lungs every 4 (four) hours as needed for wheezing or shortness of breath.          . ALPRAZolam (XANAX) 0.5 MG tablet   Oral   Take 0.5 mg by mouth 2 (two) times daily as needed for anxiety.          Marland Kitchen  Alum & Mag Hydroxide-Simeth (MAGIC MOUTHWASH) SOLN   Oral   Take 5 mLs by mouth 3 (three) times daily as needed for mouth pain.          Marland Kitchen amLODipine (NORVASC) 10 MG tablet   Oral   Take 1 tablet by mouth every morning.          Marland Kitchen atenolol (TENORMIN) 25 MG tablet   Oral   Take 25 mg by mouth every morning.          Marland Kitchen azaTHIOprine (IMURAN) 50 MG tablet   Oral   Take 100 mg by mouth every morning.         . budesonide (ENTOCORT EC) 3 MG 24 hr capsule   Oral   Take 9 mg by mouth daily.         . budesonide-formoterol (SYMBICORT) 80-4.5 MCG/ACT inhaler   Inhalation   Inhale 2 puffs into the lungs 2 (two) times daily as needed (wheezing).         . clotrimazole-betamethasone (LOTRISONE) cream   Topical   Apply 1 application topically 4 (four)  times daily as needed (itching).         . colestipol (COLESTID) 1 G tablet   Oral   Take 2 g by mouth 2 (two) times daily.         . cyclobenzaprine (FLEXERIL) 10 MG tablet   Oral   Take 10 mg by mouth at bedtime as needed for muscle spasms.         . diclofenac (VOLTAREN) 75 MG EC tablet   Oral   Take 75 mg by mouth daily.         Marland Kitchen dicyclomine (BENTYL) 20 MG tablet   Oral   Take 20 mg by mouth 3 (three) times daily.         . diphenoxylate-atropine (LOMOTIL) 2.5-0.025 MG per tablet   Oral   Take 1 tablet by mouth 4 (four) times daily as needed for diarrhea or loose stools.         . eszopiclone (LUNESTA) 2 MG TABS   Oral   Take 2 mg by mouth at bedtime as needed. Take immediately before bedtime          . fenofibrate 160 MG tablet   Oral   Take 160 mg by mouth every morning.          . fluconazole (DIFLUCAN) 100 MG tablet   Oral   Take 100 mg by mouth daily.         Marland Kitchen FLUoxetine (PROZAC) 20 MG capsule   Oral   Take 20 mg by mouth every morning.          . fluticasone (FLONASE) 50 MCG/ACT nasal spray   Each Nare   Place 1 spray into both nostrils 2 (two) times daily as needed for allergies or rhinitis.         Marland Kitchen HYDROcodone-acetaminophen (NORCO) 10-325 MG per tablet   Oral   Take 1 tablet by mouth every 4 (four) hours as needed for moderate pain or severe pain. Post-operatively   30 tablet   0   . ibuprofen (ADVIL,MOTRIN) 600 MG tablet   Oral   Take 600 mg by mouth every 6 (six) hours as needed.         Marland Kitchen ibuprofen (ADVIL,MOTRIN) 600 MG tablet   Oral   Take 600 mg by mouth every 6 (six) hours as needed.         Marland Kitchen  inFLIXimab (REMICADE) 100 MG injection   Intravenous   Inject 10 mg/kg into the vein every 6 (six) weeks.          Marland Kitchen loperamide (IMODIUM) 2 MG capsule   Oral   Take 4 mg by mouth 2 (two) times daily.          Marland Kitchen omeprazole (PRILOSEC) 20 MG capsule   Oral   Take 20 mg by mouth 2 (two) times daily before a meal.          . potassium chloride SA (K-DUR,KLOR-CON) 20 MEQ tablet   Oral   Take 20 mEq by mouth daily.         . pramoxine-hydrocortisone (ANALPRAM HC) cream   Topical   Apply 1 application topically 4 (four) times daily.         . predniSONE (DELTASONE) 10 MG tablet   Oral   Take 10 mg by mouth daily.          . promethazine (PHENERGAN) 25 MG suppository   Rectal   Place 25 mg rectally every 6 (six) hours as needed for nausea or vomiting.         . promethazine (PHENERGAN) 25 MG tablet   Oral   Take 25 mg by mouth every 6 (six) hours as needed for nausea or vomiting.         . senna-docusate (SENOKOT-S) 8.6-50 MG per tablet   Oral   Take 1 tablet by mouth 2 (two) times daily. While taking strong pain meds to prevent constipation.   30 tablet   0   . sulfamethoxazole-trimethoprim (BACTRIM) 400-80 MG per tablet   Oral   Take 1 tablet by mouth 2 (two) times daily. X 4 days to prevent post-op infection   8 tablet   0    BP 142/93  Pulse 74  Temp(Src) 98.1 F (36.7 C)  Resp 18  SpO2 100% Physical Exam  Nursing note and vitals reviewed. Constitutional: She is oriented to person, place, and time. She appears well-developed and well-nourished.  Non-toxic appearance. No distress.  HENT:  Head: Normocephalic and atraumatic.  Eyes: Conjunctivae, EOM and lids are normal. Pupils are equal, round, and reactive to light.  Neck: Normal range of motion. Neck supple. No tracheal deviation present. No mass present.  Cardiovascular: Normal rate, regular rhythm and normal heart sounds.  Exam reveals no gallop.   No murmur heard. Pulmonary/Chest: Effort normal and breath sounds normal. No stridor. No respiratory distress. She has no decreased breath sounds. She has no wheezes. She has no rhonchi. She has no rales.  Abdominal: Soft. Normal appearance and bowel sounds are normal. She exhibits no distension. There is tenderness in the epigastric area, left upper quadrant and left  lower quadrant. There is no rigidity, no rebound, no guarding and no CVA tenderness.    Musculoskeletal: Normal range of motion. She exhibits no edema and no tenderness.  Neurological: She is alert and oriented to person, place, and time. She has normal strength. No cranial nerve deficit or sensory deficit. GCS eye subscore is 4. GCS verbal subscore is 5. GCS motor subscore is 6.  Skin: Skin is warm and dry. No abrasion and no rash noted.  Psychiatric: She has a normal mood and affect. Her speech is normal and behavior is normal.    ED Course  Procedures (including critical care time) Labs Review Labs Reviewed  CBC WITH DIFFERENTIAL  COMPREHENSIVE METABOLIC PANEL  URINALYSIS, ROUTINE W REFLEX MICROSCOPIC  LIPASE, BLOOD  Imaging Review No results found.  EKG Interpretation   None       MDM  No diagnosis found. Patient given pain meds here and feels better. Abdominal CT consistent with right-sided kidney stone. Pain is controlled at time of discharge. She will follow with her urologist    Leota Jacobsen, MD 03/29/13 1344

## 2013-03-29 NOTE — ED Notes (Signed)
md at bedside  Pt alert and oriented x4. Respirations even and unlabored, bilateral symmetrical rise and fall of chest. Skin warm and dry. In no acute distress. Denies needs.

## 2013-03-29 NOTE — ED Notes (Signed)
Per pt, abdominal pain for 3 weeks-has gotten worse-history of chrohn's ds-N/V/D

## 2013-04-02 ENCOUNTER — Other Ambulatory Visit: Payer: Self-pay | Admitting: Urology

## 2013-04-02 ENCOUNTER — Encounter (HOSPITAL_BASED_OUTPATIENT_CLINIC_OR_DEPARTMENT_OTHER): Payer: Self-pay | Admitting: *Deleted

## 2013-04-02 NOTE — Progress Notes (Signed)
NPO AFTER MN. ARRIVE AT 4129. NEEDS ISTAT. CURRENT EKG IN CHART AND EPIC. WILL TAKE ATENOLOL, PRILOSEC, ENTOCORT, IMODIUM, BENTYL, AND NORVASC AM DOS W/ SIPS OF WATER. IF NEEDED MAY TAKE PHENERGAN/ HYDRCODONE.

## 2013-04-03 ENCOUNTER — Ambulatory Visit (HOSPITAL_BASED_OUTPATIENT_CLINIC_OR_DEPARTMENT_OTHER)
Admission: RE | Admit: 2013-04-03 | Discharge: 2013-04-03 | Disposition: A | Discharge status: Home / Self Care | Payer: 59 | Source: Ambulatory Visit | Attending: Urology | Admitting: Urology

## 2013-04-03 ENCOUNTER — Encounter (HOSPITAL_BASED_OUTPATIENT_CLINIC_OR_DEPARTMENT_OTHER): Payer: Self-pay | Admitting: *Deleted

## 2013-04-03 ENCOUNTER — Encounter (HOSPITAL_BASED_OUTPATIENT_CLINIC_OR_DEPARTMENT_OTHER): Payer: 59 | Admitting: Anesthesiology

## 2013-04-03 ENCOUNTER — Encounter (HOSPITAL_BASED_OUTPATIENT_CLINIC_OR_DEPARTMENT_OTHER)
Admission: RE | Disposition: A | Discharge status: Home / Self Care | Payer: Self-pay | Source: Ambulatory Visit | Attending: Urology

## 2013-04-03 ENCOUNTER — Ambulatory Visit (HOSPITAL_BASED_OUTPATIENT_CLINIC_OR_DEPARTMENT_OTHER): Payer: 59 | Admitting: Anesthesiology

## 2013-04-03 DIAGNOSIS — Z90710 Acquired absence of both cervix and uterus: Secondary | ICD-10-CM | POA: Insufficient documentation

## 2013-04-03 DIAGNOSIS — N200 Calculus of kidney: Secondary | ICD-10-CM | POA: Insufficient documentation

## 2013-04-03 DIAGNOSIS — Z9089 Acquired absence of other organs: Secondary | ICD-10-CM | POA: Insufficient documentation

## 2013-04-03 DIAGNOSIS — I10 Essential (primary) hypertension: Secondary | ICD-10-CM | POA: Insufficient documentation

## 2013-04-03 DIAGNOSIS — K219 Gastro-esophageal reflux disease without esophagitis: Secondary | ICD-10-CM | POA: Insufficient documentation

## 2013-04-03 DIAGNOSIS — E785 Hyperlipidemia, unspecified: Secondary | ICD-10-CM | POA: Insufficient documentation

## 2013-04-03 DIAGNOSIS — D649 Anemia, unspecified: Secondary | ICD-10-CM | POA: Insufficient documentation

## 2013-04-03 DIAGNOSIS — F3289 Other specified depressive episodes: Secondary | ICD-10-CM | POA: Insufficient documentation

## 2013-04-03 DIAGNOSIS — F329 Major depressive disorder, single episode, unspecified: Secondary | ICD-10-CM | POA: Insufficient documentation

## 2013-04-03 DIAGNOSIS — K5090 Crohn's disease, unspecified, without complications: Secondary | ICD-10-CM | POA: Insufficient documentation

## 2013-04-03 DIAGNOSIS — N201 Calculus of ureter: Secondary | ICD-10-CM | POA: Insufficient documentation

## 2013-04-03 DIAGNOSIS — N3289 Other specified disorders of bladder: Secondary | ICD-10-CM | POA: Insufficient documentation

## 2013-04-03 DIAGNOSIS — N1330 Unspecified hydronephrosis: Secondary | ICD-10-CM | POA: Insufficient documentation

## 2013-04-03 DIAGNOSIS — Z87891 Personal history of nicotine dependence: Secondary | ICD-10-CM | POA: Insufficient documentation

## 2013-04-03 HISTORY — DX: Other hemorrhoids: K64.8

## 2013-04-03 HISTORY — DX: Urgency of urination: R39.15

## 2013-04-03 HISTORY — DX: Other specified postprocedural states: Z98.890

## 2013-04-03 HISTORY — DX: Hyperlipidemia, unspecified: E78.5

## 2013-04-03 HISTORY — DX: Anemia in other chronic diseases classified elsewhere: D63.8

## 2013-04-03 HISTORY — DX: Personal history of other diseases of the digestive system: Z87.19

## 2013-04-03 HISTORY — DX: Other asthma: J45.998

## 2013-04-03 HISTORY — PX: CYSTOSCOPY WITH RETROGRADE PYELOGRAM, URETEROSCOPY AND STENT PLACEMENT: SHX5789

## 2013-04-03 HISTORY — DX: Hypokalemia: E87.6

## 2013-04-03 HISTORY — DX: Personal history of colonic polyps: Z86.010

## 2013-04-03 HISTORY — DX: Noninfective gastroenteritis and colitis, unspecified: K52.9

## 2013-04-03 HISTORY — DX: Unspecified osteoarthritis, unspecified site: M19.90

## 2013-04-03 HISTORY — DX: Personal history of urinary calculi: Z87.442

## 2013-04-03 HISTORY — DX: Personal history of adenomatous and serrated colon polyps: Z86.0101

## 2013-04-03 HISTORY — DX: Calculus of ureter: N20.1

## 2013-04-03 LAB — POCT I-STAT 4, (NA,K, GLUC, HGB,HCT)
Glucose, Bld: 88 mg/dL (ref 70–99)
HCT: 33 % — ABNORMAL LOW (ref 36.0–46.0)
Hemoglobin: 11.2 g/dL — ABNORMAL LOW (ref 12.0–15.0)
POTASSIUM: 3.3 meq/L — AB (ref 3.7–5.3)
Sodium: 145 mEq/L (ref 137–147)

## 2013-04-03 SURGERY — CYSTOURETEROSCOPY, WITH RETROGRADE PYELOGRAM AND STENT INSERTION
Anesthesia: General | Site: Ureter | Laterality: Right

## 2013-04-03 MED ORDER — EPHEDRINE SULFATE 50 MG/ML IJ SOLN
INTRAMUSCULAR | Status: DC | PRN
  Administered 2013-04-03: 10 mg via INTRAVENOUS

## 2013-04-03 MED ORDER — CEFTRIAXONE SODIUM 2 G IJ SOLR
INTRAMUSCULAR | Status: AC
  Filled 2013-04-03: qty 2

## 2013-04-03 MED ORDER — FENTANYL CITRATE 0.05 MG/ML IJ SOLN
25.0000 ug | INTRAMUSCULAR | Status: DC | PRN
  Administered 2013-04-03 (×2): 25 ug via INTRAVENOUS
  Filled 2013-04-03: qty 1

## 2013-04-03 MED ORDER — IOHEXOL 350 MG/ML SOLN
INTRAVENOUS | Status: DC | PRN
  Administered 2013-04-03: 10 mL

## 2013-04-03 MED ORDER — MIDAZOLAM HCL 5 MG/5ML IJ SOLN
INTRAMUSCULAR | Status: DC | PRN
  Administered 2013-04-03: 2 mg via INTRAVENOUS

## 2013-04-03 MED ORDER — DEXTROSE 5 % IV SOLN
1.0000 g | Freq: Once | INTRAVENOUS | Status: AC
  Administered 2013-04-03: 1 g via INTRAVENOUS
  Filled 2013-04-03: qty 10

## 2013-04-03 MED ORDER — MIDAZOLAM HCL 2 MG/2ML IJ SOLN
INTRAMUSCULAR | Status: AC
  Filled 2013-04-03: qty 2

## 2013-04-03 MED ORDER — KETOROLAC TROMETHAMINE 30 MG/ML IJ SOLN
INTRAMUSCULAR | Status: DC | PRN
  Administered 2013-04-03: 30 mg via INTRAVENOUS

## 2013-04-03 MED ORDER — SCOPOLAMINE 1 MG/3DAYS TD PT72
1.0000 | MEDICATED_PATCH | TRANSDERMAL | Status: DC
  Administered 2013-04-03: 1 via TRANSDERMAL
  Filled 2013-04-03: qty 1

## 2013-04-03 MED ORDER — PROPOFOL 10 MG/ML IV BOLUS
INTRAVENOUS | Status: DC | PRN
  Administered 2013-04-03: 180 mg via INTRAVENOUS

## 2013-04-03 MED ORDER — PROMETHAZINE HCL 25 MG/ML IJ SOLN
6.2500 mg | INTRAMUSCULAR | Status: DC | PRN
  Filled 2013-04-03: qty 1

## 2013-04-03 MED ORDER — SODIUM CHLORIDE 0.9 % IR SOLN
Status: DC | PRN
  Administered 2013-04-03: 4000 mL

## 2013-04-03 MED ORDER — SENNOSIDES-DOCUSATE SODIUM 8.6-50 MG PO TABS: 1.0000 | ORAL_TABLET | Freq: Two times a day (BID) | ORAL | Status: DC

## 2013-04-03 MED ORDER — LACTATED RINGERS IV SOLN
INTRAVENOUS | Status: DC
  Administered 2013-04-03 (×2): via INTRAVENOUS
  Filled 2013-04-03: qty 1000

## 2013-04-03 MED ORDER — TRAMADOL HCL 50 MG PO TABS: 50.0000 mg | ORAL_TABLET | Freq: Four times a day (QID) | ORAL | Status: DC | PRN

## 2013-04-03 MED ORDER — LIDOCAINE HCL (CARDIAC) 20 MG/ML IV SOLN
INTRAVENOUS | Status: DC | PRN
  Administered 2013-04-03: 50 mg via INTRAVENOUS

## 2013-04-03 MED ORDER — FENTANYL CITRATE 0.05 MG/ML IJ SOLN
INTRAMUSCULAR | Status: AC
  Filled 2013-04-03: qty 2

## 2013-04-03 MED ORDER — TRAMADOL HCL 50 MG PO TABS
50.0000 mg | ORAL_TABLET | Freq: Four times a day (QID) | ORAL | Status: DC | PRN
  Administered 2013-04-03: 50 mg via ORAL
  Filled 2013-04-03: qty 1

## 2013-04-03 MED ORDER — FENTANYL CITRATE 0.05 MG/ML IJ SOLN
INTRAMUSCULAR | Status: DC | PRN
  Administered 2013-04-03: 25 ug via INTRAVENOUS
  Administered 2013-04-03: 50 ug via INTRAVENOUS
  Administered 2013-04-03: 25 ug via INTRAVENOUS

## 2013-04-03 MED ORDER — FENTANYL CITRATE 0.05 MG/ML IJ SOLN
INTRAMUSCULAR | Status: AC
  Filled 2013-04-03: qty 6

## 2013-04-03 MED ORDER — DEXAMETHASONE SODIUM PHOSPHATE 4 MG/ML IJ SOLN
INTRAMUSCULAR | Status: DC | PRN
  Administered 2013-04-03: 10 mg via INTRAVENOUS

## 2013-04-03 MED ORDER — CEPHALEXIN 500 MG PO CAPS: 500.0000 mg | ORAL_CAPSULE | Freq: Two times a day (BID) | ORAL | Status: AC

## 2013-04-03 MED ORDER — TRAMADOL HCL 50 MG PO TABS
ORAL_TABLET | ORAL | Status: AC
  Filled 2013-04-03: qty 1

## 2013-04-03 MED ORDER — ONDANSETRON HCL 4 MG/2ML IJ SOLN
INTRAMUSCULAR | Status: DC | PRN
  Administered 2013-04-03: 4 mg via INTRAVENOUS

## 2013-04-03 MED ORDER — SCOPOLAMINE 1 MG/3DAYS TD PT72
MEDICATED_PATCH | TRANSDERMAL | Status: AC
  Filled 2013-04-03: qty 1

## 2013-04-03 SURGICAL SUPPLY — 43 items
BAG DRAIN URO-CYSTO SKYTR STRL (DRAIN) ×4 IMPLANT
BAG DRN UROCATH (DRAIN) ×2
BASKET LASER NITINOL 1.9FR (BASKET) IMPLANT
BASKET STNLS GEMINI 4WIRE 3FR (BASKET) IMPLANT
BASKET ZERO TIP NITINOL 2.4FR (BASKET) IMPLANT
BRUSH URET BIOPSY 3F (UROLOGICAL SUPPLIES) IMPLANT
BSKT STON RTRVL GEM 120X11 3FR (BASKET)
CANISTER SUCT LVC 12 LTR MEDI- (MISCELLANEOUS) ×4 IMPLANT
CATH INTERMIT  6FR 70CM (CATHETERS) ×4 IMPLANT
CATH URET 5FR 28IN CONE TIP (BALLOONS)
CATH URET 5FR 28IN OPEN ENDED (CATHETERS) IMPLANT
CATH URET 5FR 70CM CONE TIP (BALLOONS) IMPLANT
CLOTH BEACON ORANGE TIMEOUT ST (SAFETY) ×4 IMPLANT
DRAPE CAMERA CLOSED 9X96 (DRAPES) ×4 IMPLANT
ELECT REM PT RETURN 9FT ADLT (ELECTROSURGICAL)
ELECTRODE REM PT RTRN 9FT ADLT (ELECTROSURGICAL) IMPLANT
EXTRACTOR STONE NITINOL NGAGE (UROLOGICAL SUPPLIES) ×4 IMPLANT
FIBER LASER FLEXIVA 200 (UROLOGICAL SUPPLIES) IMPLANT
FIBER LASER FLEXIVA 365 (UROLOGICAL SUPPLIES) IMPLANT
GLOVE BIO SURGEON STRL SZ7.5 (GLOVE) ×4 IMPLANT
GLOVE BIOGEL M STER SZ 6 (GLOVE) ×4 IMPLANT
GLOVE BIOGEL PI IND STRL 6.5 (GLOVE) ×4 IMPLANT
GLOVE BIOGEL PI INDICATOR 6.5 (GLOVE) ×4
GOWN PREVENTION PLUS LG XLONG (DISPOSABLE) IMPLANT
GOWN STRL REIN XL XLG (GOWN DISPOSABLE) IMPLANT
GOWN STRL REUS W/TWL LRG LVL3 (GOWN DISPOSABLE) ×4 IMPLANT
GOWN STRL REUS W/TWL XL LVL3 (GOWN DISPOSABLE) ×4 IMPLANT
GUIDEWIRE 0.038 PTFE COATED (WIRE) IMPLANT
GUIDEWIRE ANG ZIPWIRE 038X150 (WIRE) ×4 IMPLANT
GUIDEWIRE STR DUAL SENSOR (WIRE) ×4 IMPLANT
IV NS IRRIG 3000ML ARTHROMATIC (IV SOLUTION) ×8 IMPLANT
KIT BALLIN UROMAX 15FX10 (LABEL) IMPLANT
KIT BALLN UROMAX 15FX4 (MISCELLANEOUS) IMPLANT
KIT BALLN UROMAX 26 75X4 (MISCELLANEOUS)
PACK CYSTOSCOPY (CUSTOM PROCEDURE TRAY) ×4 IMPLANT
POLARIS ×4 IMPLANT
SET HIGH PRES BAL DIL (LABEL)
SHEATH URET ACCESS 12FR/35CM (UROLOGICAL SUPPLIES) IMPLANT
SHEATH URET ACCESS 12FR/55CM (UROLOGICAL SUPPLIES) IMPLANT
STENT POLARIS 5FRX24 (STENTS) ×4 IMPLANT
SYRINGE 10CC LL (SYRINGE) ×4 IMPLANT
SYRINGE IRR TOOMEY STRL 70CC (SYRINGE) IMPLANT
TUBE FEEDING 8FR 16IN STR KANG (MISCELLANEOUS) IMPLANT

## 2013-04-03 NOTE — Anesthesia Postprocedure Evaluation (Signed)
  Anesthesia Post-op Note  Patient: [name]  Procedure(s) Performed: Procedure(s) (LRB): CYSTOSCOPY WITH RETROGRADE PYELOGRAM, URETEROSCOPY AND STENT PLACEMENT (Right)  Patient Location: PACU  Anesthesia Type: General  Level of Consciousness: awake and alert   Airway and Oxygen Therapy: Patient Spontanous Breathing  Post-op Pain: mild  Post-op Assessment: Post-op Vital signs reviewed, Patient's Cardiovascular Status Stable, Respiratory Function Stable, Patent Airway and No signs of Nausea or vomiting  Last Vitals:  Filed Vitals:   04/03/13 1130  BP: 143/74  Pulse: 81  Temp:   Resp: 15    Post-op Vital Signs: stable   Complications: No apparent anesthesia complications

## 2013-04-03 NOTE — H&P (Signed)
[name] is an 53 y.o. female.    Chief Complaint: Pre-OP Rt Ureteroscopic Stone Manipulation  HPI:   1 - Nephrolithiasis -  03/08/2013 - left URS / tethered stent 03/08/2013 for 85m UVJ stone. No additional stones seen at that time.  03/29/2013 - seen in ER for abd pain and found to have Rt 294mUVJ stone (not seen by radiologist or myself on prior imaging)  PMH sig for Chron's (on imuran, remicade), SBO/stricture / small bowel resection x2, lap chole.   Today [name] seen to proceed with ureteroscopy for her new right distal stone. Her symptoms have been very bothersome and she does not want further medical therapy. Most recent UA without infectious parameters.  Past Medical History  Diagnosis Date  . Crohn's disease   . Depression   . GERD (gastroesophageal reflux disease)   . Vitamin B12 deficiency   . Psoriasis     SKIN  . Family history of malignant neoplasm of gastrointestinal tract   . Crohn's colitis   . PONV (postoperative nausea and vomiting)   . Hypertension   . H/O hiatal hernia   . Seasonal asthma   . S/P dilatation of esophageal stricture   . History of adenomatous polyp of colon   . History of small bowel obstruction     SECONDARY CROHN'S STRICTURE  S/P COLECTOMY  . History of gastritis     AND HX ILEITIS  . Chronic diarrhea   . Internal hemorrhoids   . History of kidney stones   . Right ureteral stone   . Hypopotassemia   . Chronic disease anemia   . Urgency of urination   . Arthritis   . Hyperlipidemia     Past Surgical History  Procedure Laterality Date  . Carpal tunnel release Right 2000  . Anal fissure repair  1990's  . Cystoscopy with retrograde pyelogram, ureteroscopy and stent placement Left 03/08/2013    Procedure: CYSTOSCOPY WITH RETROGRADE PYELOGRAM, URETEROSCOPY AND STENT PLACEMENT stone basketing;  Surgeon: ThAlexis FrockMD;  Location: [facility];  Service: Urology;  Laterality: Left;  . Vaginal hysterectomy  1992  . Cholecystectomy   2002  . Laparoscopic ileocecectomy  10-09-2008    AND APPENDECTOMY (TERMINAL ILEITIS & PARTIAL SBO)  . Dx laparoscopy converted to open right colecctomy  09-15-2010    ANASTOMOTIC STRICTURE    Family History  Problem Relation Age of Onset  . Colon cancer Mother   . Breast cancer Paternal Grandmother   . Colon polyps Mother   . Colon polyps Father   . Colon polyps Brother    Social History:  reports that she quit smoking about 29 years ago. Her smoking use included Cigarettes. She has a 10 pack-year smoking history. She has never used smokeless tobacco. She reports that she does not drink alcohol or use illicit drugs.  Allergies:  Allergies  Allergen Reactions  . Codeine Hives and Nausea And Vomiting  . Oxycodone Nausea And Vomiting    SEVERE N/V AND CRAMPING    No prescriptions prior to admission    No results found for this or any previous visit (from the past 48 hour(s)). No results found.  Review of Systems  Constitutional: Positive for malaise/fatigue. Negative for fever and chills.  HENT: Negative.   Eyes: Negative.   Respiratory: Negative.   Cardiovascular: Negative.   Gastrointestinal: Positive for nausea. Negative for vomiting.  Genitourinary: Positive for flank pain.  Musculoskeletal: Negative.   Skin: Negative.   Neurological: Negative.  Endo/Heme/Allergies: Negative.   Psychiatric/Behavioral: Negative.     Height 5' 8" (1.727 m), weight 74.844 kg (165 lb). Physical Exam  Constitutional: She is oriented to person, place, and time. She appears well-developed and well-nourished.  HENT:  Head: Normocephalic and atraumatic.  Eyes: EOM are normal. Pupils are equal, round, and reactive to light.  Neck: Normal range of motion. Neck supple.  Cardiovascular: Normal rate and regular rhythm.   Respiratory: Effort normal and breath sounds normal.  GI: Soft. Bowel sounds are normal.  Genitourinary:  Moderate Rt CVAT  Musculoskeletal: Normal range of motion.   Neurological: She is alert and oriented to person, place, and time.  Skin: Skin is warm.  Psychiatric: She has a normal mood and affect. Her behavior is normal. Judgment and thought content normal.     Assessment/Plan   1 - Nephrolithiasis - Unusual Case. Now with tiny rt sided stone with sig hydro from stone not previously noted whatsoever on recent prior imaging. Discussed MET v. URS... too small to target for shockwave. She adamantly wants Rt sided URS.  We rediscussed ureteroscopic stone manipulation with basketing and laser-lithotripsy in detail.  We rediscussed risks including bleeding, infection, damage to kidney / ureter  bladder, rarely loss of kidney. We rediscussed anesthetic risks and rare but serious surgical complications including DVT, PE, MI, and mortality. We specifically readdressed that in 5-10% of cases a staged approach is required with stenting followed by re-attempt ureteroscopy if anatomy unfavorable. The patient voiced understanding and wises to proceed today as planned.  ,  04/03/2013, 6:18 AM

## 2013-04-03 NOTE — Brief Op Note (Signed)
04/03/2013  10:32 AM  PATIENT:  [name]  53 y.o. female  PRE-OPERATIVE DIAGNOSIS:  RIGHT URETERAL STONE  POST-OPERATIVE DIAGNOSIS:  RIGHT URETERAL STONE  PROCEDURE:  Procedure(s): CYSTOSCOPY WITH RETROGRADE PYELOGRAM, URETEROSCOPY AND STENT PLACEMENT (Right), Basket Rt ureteral stone  SURGEON:  Surgeon(s) and Role:    * Alexis Frock, MD - Primary  PHYSICIAN ASSISTANT:   ASSISTANTS: none   ANESTHESIA:   general  EBL:  Total I/O In: 200 [I.V.:200] Out: -   BLOOD ADMINISTERED:none  DRAINS: none   LOCAL MEDICATIONS USED:  NONE  SPECIMEN:  Source of Specimen:  Rt ureteral stone  DISPOSITION OF SPECIMEN:  [facility] for compositional analysis  COUNTS:  YES  TOURNIQUET:  * No tourniquets in log *  DICTATION: .Other Dictation: Dictation Number [id number]  PLAN OF CARE: Discharge to home after PACU  PATIENT DISPOSITION:  PACU - hemodynamically stable.   Delay start of Pharmacological VTE agent (>24hrs) due to surgical blood loss or risk of bleeding: not applicable

## 2013-04-03 NOTE — Anesthesia Preprocedure Evaluation (Signed)
Anesthesia Evaluation  Patient identified by MRN, date of birth, ID band Patient awake    Reviewed: Allergy & Precautions, H&P , NPO status , Patient's Chart, lab work & pertinent test results  History of Anesthesia Complications (+) PONV and history of anesthetic complications  Airway Mallampati: II TM Distance: >3 FB Neck ROM: Full    Dental no notable dental hx.    Pulmonary neg pulmonary ROS, asthma , former smoker,  breath sounds clear to auscultation  Pulmonary exam normal       Cardiovascular Exercise Tolerance: Good hypertension, Pt. on medications and Pt. on home beta blockers negative cardio ROS  Rhythm:Regular Rate:Normal     Neuro/Psych PSYCHIATRIC DISORDERS Depression  Neuromuscular disease negative neurological ROS  negative psych ROS   GI/Hepatic negative GI ROS, Neg liver ROS, hiatal hernia, GERD-  ,  Endo/Other  negative endocrine ROS  Renal/GU negative Renal ROS  negative genitourinary   Musculoskeletal negative musculoskeletal ROS (+)   Abdominal   Peds negative pediatric ROS (+)  Hematology negative hematology ROS (+) anemia ,   Anesthesia Other Findings   Reproductive/Obstetrics negative OB ROS                           Anesthesia Physical Anesthesia Plan  ASA: II  Anesthesia Plan: General   Post-op Pain Management:    Induction: Intravenous  Airway Management Planned: LMA  Additional Equipment:   Intra-op Plan:   Post-operative Plan: Extubation in OR  Informed Consent: I have reviewed the patients History and Physical, chart, labs and discussed the procedure including the risks, benefits and alternatives for the proposed anesthesia with the patient or authorized representative who has indicated his/her understanding and acceptance.   Dental advisory given  Plan Discussed with: CRNA  Anesthesia Plan Comments:         Anesthesia Quick  Evaluation

## 2013-04-03 NOTE — Transfer of Care (Signed)
Immediate Anesthesia Transfer of Care Note  Patient: [name]  Procedure(s) Performed: Procedure(s) (LRB): CYSTOSCOPY WITH RETROGRADE PYELOGRAM, URETEROSCOPY AND STENT PLACEMENT (Right)  Patient Location: PACU  Anesthesia Type: General  Level of Consciousness: awake, oriented, sedated and patient cooperative  Airway & Oxygen Therapy: Patient Spontanous Breathing and Patient connected to face mask oxygen  Post-op Assessment: Report given to PACU RN and Post -op Vital signs reviewed and stable  Post vital signs: Reviewed and stable  Complications: No apparent anesthesia complications

## 2013-04-03 NOTE — Discharge Instructions (Signed)
1 - You may have urinary urgency (bladder spasms) and bloody urine on / off with stent in place. This is normal.  2 - Call MD or go to ER for fever >102, severe pain / nausea / vomiting not relieved by medications, or acute change in medical status  3 - Remove tethered stent on Monday morning at home by pulling on string, then blue/white plastic tubing and discarding. Dr. Tresa Moore is in office Monday if any issues arise.    Post Anesthesia Home Care Instructions  Activity: Get plenty of rest for the remainder of the day. A responsible adult should stay with you for 24 hours following the procedure.  For the next 24 hours, DO NOT: -Drive a car -Paediatric nurse -Drink alcoholic beverages -Take any medication unless instructed by your physician -Make any legal decisions or sign important papers.  Meals: Start with liquid foods such as gelatin or soup. Progress to regular foods as tolerated. Avoid greasy, spicy, heavy foods. If nausea and/or vomiting occur, drink only clear liquids until the nausea and/or vomiting subsides. Call your physician if vomiting continues.  Special Instructions/Symptoms: Your throat may feel dry or sore from the anesthesia or the breathing tube placed in your throat during surgery. If this causes discomfort, gargle with warm salt water. The discomfort should disappear within 24 hours.  [facility] [phone] Post Ureteroscopy With or Without Stent Instructions  Definitions:  Ureter: The duct that transports urine from the kidney to the bladder. Stent:   A plastic hollow tube that is placed into the ureter, from the kidney to the                 bladder to prevent the ureter from swelling shut.  GENERAL INSTRUCTIONS:  Despite the fact that no skin incisions were used, the area around the ureter and bladder is raw and irritated. The stent is a foreign body which will further irritate the bladder wall. This irritation is manifested by increased  frequency of urination, both day and night, and by an increase in the urge to urinate. In some, the urge to urinate is present almost always. Sometimes the urge is strong enough that you may not be able to stop yourself from urinating. The only real cure is to remove the stent and then give time for the bladder wall to heal which can't be done until the danger of the ureter swelling shut has passed, which varies.  You may see some blood in your urine while the stent is in place and a few days afterwards. Do not be alarmed, even if the urine was clear for a while. Get off your feet and drink lots of fluids until clearing occurs. If you start to pass clots or don't improve, call us.  DIET: You may return to your normal diet immediately. Because of the raw surface of your bladder, alcohol, spicy foods, acid type foods and drinks with caffeine may cause irritation or frequency and should be used in moderation. To keep your urine flowing freely and to avoid constipation, drink plenty of fluids during the day ( 8-10 glasses ). Tip: Avoid cranberry juice because it is very acidic.  ACTIVITY: Your physical activity doesn't need to be restricted. However, if you are very active, you may see some blood in your urine. We suggest that you reduce your activity under these circumstances until the bleeding has stopped.  BOWELS: It is important to keep your bowels regular during the postoperative period. Straining with bowel  movements can cause bleeding. A bowel movement every other day is reasonable. Use a mild laxative if needed, such as Milk of Magnesia 2-3 tablespoons, or 2 Dulcolax tablets. Call if you continue to have problems. If you have been taking narcotics for pain, before, during or after your surgery, you may be constipated. Take a laxative if necessary.   MEDICATION: You should resume your pre-surgery medications unless told not to. In addition you will often be given an antibiotic to prevent  infection. These should be taken as prescribed until the bottles are finished unless you are having an unusual reaction to one of the drugs.  PROBLEMS YOU SHOULD REPORT TO [place]:  Fevers over 100.5 Fahrenheit.  Heavy bleeding, or clots ( See above notes about blood in urine ).  Inability to urinate.  Drug reactions ( hives, rash, nausea, vomiting, diarrhea ).  Severe burning or pain with urination that is not improving.  FOLLOW-UP: You will need a follow-up appointment to monitor your progress. Call for this appointment at the number listed above. Usually the first appointment will be about three to fourteen days after your surgery.

## 2013-04-03 NOTE — Anesthesia Procedure Notes (Signed)
Procedure Name: LMA Insertion Date/Time: 04/03/2013 10:08 AM Performed by: Denna Haggard D Pre-anesthesia Checklist: Patient identified, Emergency Drugs available, Suction available and Patient being monitored Patient Re-evaluated:Patient Re-evaluated prior to inductionOxygen Delivery Method: Circle System Utilized Preoxygenation: Pre-oxygenation with 100% oxygen Intubation Type: IV induction Ventilation: Mask ventilation without difficulty LMA: LMA inserted LMA Size: 4.0 Number of attempts: 1 Airway Equipment and Method: bite block Placement Confirmation: positive ETCO2 Tube secured with: Tape Dental Injury: Teeth and Oropharynx as per pre-operative assessment

## 2013-04-04 ENCOUNTER — Encounter (HOSPITAL_BASED_OUTPATIENT_CLINIC_OR_DEPARTMENT_OTHER): Payer: Self-pay | Admitting: Urology

## 2013-04-04 NOTE — Op Note (Signed)
NAMEKHYLER, URDA            ACCOUNT NO.:  [id number]  MEDICAL RECORD NO.:  [id number]  LOCATION:                                 FACILITY:  PHYSICIAN:  Alexis Frock, MD     DATE OF BIRTH:  08-20-1960  DATE OF PROCEDURE: 04/03/2013 DATE OF DISCHARGE:  04/03/2013                              OPERATIVE REPORT   DIAGNOSIS:  Right ureteral stone with refractory flank pain.  PROCEDURES: 1. Cystoscopy with right retrograde pyelogram with interpretation. 2. Right ureteroscopy with basketing of stone. 3. Insertion of right ureteral stent, 5 x 24, with tether to the right     thigh.  ESTIMATED BLOOD LOSS:  Nil.  COMPLICATIONS:  None.  SPECIMEN:  Right distal stone for compositional analysis.  FINDINGS: 1. Filling defects in distal ureter on retrograde pyelogram consistent     with known stone, mild hydroureteronephrosis. 2. No additional calculi in the right kidney or ureter with     panureteroscopy of the right side. 3. Unremarkable urinary bladder. 4. Successful placement of right ureteral stent, proximal curl on the     renal pelvis, distal end in the urinary bladder.  INDICATIONS:  Ms. Tamez is a unfortunate 53 year old lady with history of Crohn's disease, status post multiple bowel surgeries, who has had recent bout of nephrolithiasis.  She originally presented with a left ureteral stone, which was managed earlier this month.  Her CT scan at that time revealed no evidence whatsoever, right-sided urolithiasis. Unfortunately, she re-presented to the emergency room approximately week ago with new right-sided flank pain.  Then, CT scan at that time revealed a new right distal ureteral stone that was very small and likely not seen due to volume averaging on prior study.  She presented to the office yesterday and options were discussed including medical therapy versus ureteroscopy, and she adamantly wished to proceed with the latter as her symptoms were very  difficult to control.  Informed consent was obtained and placed in the medical record.  PROCEDURE IN DETAIL:  The patient being [name], was verified. Procedure being right ureteroscopic stone manipulation was confirmed. Procedure was carried out.  Time-out was performed.  Intravenous antibiotics were administered.  General LMA anesthesia was introduced. The patient was placed into a low lithotomy position.  Sterile field was created by prepping and draping the patient's vagina, introitus, and proximal thighs using iodine x3.  Next, cystourethroscopy was performed using a 22-French rigid cystoscope with 12-degree offset lens. Inspection of the urinary bladder revealed no diverticula, calcifications, papular lesions.  There were some expected mild inflammatory changes around the distal left ureteral orifice consistent with recent prior stenting on the left side.  The right ureteral orifice was cannulated with a 6-French end-hole catheter and right retrograde pyelogram was obtained.  Right retrograde pyelogram demonstrates single right ureter, single- system right kidney.  There was a filling defect in the lower ureter consistent with known stone.  There was a mild hydroureteronephrosis. Above this, a 0.038 Glidewire was advanced at the level of the midpole and set aside as a safety wire.  An 8-French feeding tube was placed in the urinary bladder for pressure release.  Next, semi-rigid ureteroscopy was performed at  the distal right ureter alongside a separate Sensor working wire.  The stone in question was indeed found, estimated to be at the area of the iliac crossing.  An NGage basket was then used to grasp the stone on its long axis and it was completely removed and set aside for compositional analysis.  Semi-rigid ureteroscopy above this level to the level of the UPJ revealed no additional calcifications or mucosal abnormalities of the ureter.  We also wanted to  completely verify that there was no residual urolithiasis on the right side.  As such, the semi-rigid ureteroscope was exchanged over the Sensor working wire for the flexible ureteroscope to the level of the renal pelvis. Flexible ureteroscopy was performed of each calyx of the right kidney x2 and no additional mucosal abnormalities or calcifications were found. The ureteroscope was removed under continuous ureteroscopic vision and no abnormalities were found at the ureter.  Finally, a new 5 x 24 Polaris-type stent was placed over the remaining safety wire.  Good proximal and distal deployment were noted.  Bladder was emptied per cystoscope.  Procedure was then terminated.  The patient tolerated the procedure well.  There were no immediate periprocedural complications. The patient was taken to the postanesthesia care unit in stable condition.          ______________________________ Alexis Frock, MD     TM/MEDQ  D:  04/03/2013  T:  04/03/2013  Job:  [id number]

## 2013-04-16 ENCOUNTER — Encounter: Payer: Self-pay | Admitting: *Deleted

## 2013-04-16 ENCOUNTER — Ambulatory Visit (INDEPENDENT_AMBULATORY_CARE_PROVIDER_SITE_OTHER): Payer: 59 | Admitting: Internal Medicine

## 2013-04-16 ENCOUNTER — Encounter: Payer: Self-pay | Admitting: Internal Medicine

## 2013-04-16 VITALS — BP 120/70 | HR 66 | Ht 68.0 in | Wt 168.6 lb

## 2013-04-16 DIAGNOSIS — Z79899 Other long term (current) drug therapy: Secondary | ICD-10-CM

## 2013-04-16 DIAGNOSIS — K625 Hemorrhage of anus and rectum: Secondary | ICD-10-CM

## 2013-04-16 DIAGNOSIS — D849 Immunodeficiency, unspecified: Secondary | ICD-10-CM

## 2013-04-16 DIAGNOSIS — K5000 Crohn's disease of small intestine without complications: Secondary | ICD-10-CM

## 2013-04-16 DIAGNOSIS — K648 Other hemorrhoids: Secondary | ICD-10-CM

## 2013-04-16 MED ORDER — HYDROCODONE-ACETAMINOPHEN 10-325 MG PO TABS: 1.0000 | ORAL_TABLET | ORAL | Status: DC | PRN

## 2013-04-16 MED ORDER — LOPERAMIDE HCL 2 MG PO CAPS: 4.0000 mg | ORAL_CAPSULE | Freq: Two times a day (BID) | ORAL | Status: DC

## 2013-04-16 MED ORDER — FLUCONAZOLE 100 MG PO TABS: 100.0000 mg | ORAL_TABLET | Freq: Every day | ORAL | Status: DC

## 2013-04-16 MED ORDER — DICLOFENAC SODIUM 75 MG PO TBEC: 75.0000 mg | DELAYED_RELEASE_TABLET | Freq: Every day | ORAL | Status: DC

## 2013-04-16 MED ORDER — OMEPRAZOLE 20 MG PO CPDR: 20.0000 mg | DELAYED_RELEASE_CAPSULE | Freq: Two times a day (BID) | ORAL | Status: DC

## 2013-04-16 MED ORDER — AZATHIOPRINE 50 MG PO TABS: 100.0000 mg | ORAL_TABLET | ORAL | Status: DC

## 2013-04-16 MED ORDER — HYDROCORTISONE ACETATE 25 MG RE SUPP: RECTAL | Status: DC

## 2013-04-16 MED ORDER — CYCLOBENZAPRINE HCL 10 MG PO TABS: 10.0000 mg | ORAL_TABLET | Freq: Every evening | ORAL | Status: DC | PRN

## 2013-04-16 MED ORDER — DIPHENOXYLATE-ATROPINE 2.5-0.025 MG PO TABS: 1.0000 | ORAL_TABLET | Freq: Four times a day (QID) | ORAL | Status: DC | PRN

## 2013-04-16 MED ORDER — PREDNISONE 5 MG PO TABS: 5.0000 mg | ORAL_TABLET | ORAL | Status: DC

## 2013-04-16 MED ORDER — BUDESONIDE 3 MG PO CP24: 9.0000 mg | ORAL_CAPSULE | Freq: Every morning | ORAL | Status: DC

## 2013-04-16 MED ORDER — DICYCLOMINE HCL 20 MG PO TABS: 20.0000 mg | ORAL_TABLET | Freq: Three times a day (TID) | ORAL | Status: DC

## 2013-04-16 NOTE — Patient Instructions (Signed)
We have sent the following medications to your pharmacy for you to pick up at your convenience: Imuran Entocort Flexeril Voltaren Diflucan Bentyl Lomotil Imodium Prilosec Prednisone Anusol Suppositories  We have given you a prescription of hydrocodone to take to your pharmacy.  We have increased your Remicade to 10 mg/kg every 6 weeks instead of 5 mg/kg. I have spoken to [place] at [facility] to advise them of this.  CC: Dr Theda Sers

## 2013-04-16 NOTE — Progress Notes (Signed)
[name] 10-12-1960 [id number]  Note: This dictation was prepared with Dragon digital system. Any transcriptional errors that result from this procedure are unintentional.   History of Present Illness:  This is a 53 year old white female with Crohn's disease. Her last office visit with [place] was in December 2014. Her initial diagnosis was made in 2007. Diagnosis was made on a small bowel capsule endoscopy. Since her last appointment, she has had 2 episodes of renal colic demonstrated on a CT scan done 03/29/2013. She also had recent onset of low-volume bright red blood per rectum associated with rectal irritation. The last episode of bleeding occurred 48 hours ago. She has chronic diarrhea and crampy abdominal pain. She is due for a Remicade infusion tomorrow. She continues on Imuran 100 mg, prednisone 10 mg, and Entocort 6 mg a day. She has lost several pounds since her last appointment and is down from 175 to 168 pounds.    Past Medical History  Diagnosis Date  . Crohn's disease   . Depression   . GERD (gastroesophageal reflux disease)   . Vitamin B12 deficiency   . Psoriasis     SKIN  . Family history of malignant neoplasm of gastrointestinal tract   . Crohn's colitis   . PONV (postoperative nausea and vomiting)   . Hypertension   . H/O hiatal hernia   . Seasonal asthma   . S/P dilatation of esophageal stricture   . History of adenomatous polyp of colon   . History of small bowel obstruction     SECONDARY CROHN'S STRICTURE  S/P COLECTOMY  . History of gastritis     AND HX ILEITIS  . Chronic diarrhea   . Internal hemorrhoids   . History of kidney stones   . Right ureteral stone   . Hypopotassemia   . Chronic disease anemia   . Urgency of urination   . Arthritis   . Hyperlipidemia     Past Surgical History  Procedure Laterality Date  . Carpal tunnel release Right 2000  . Anal fissure repair  1990's  . Cystoscopy with retrograde pyelogram, ureteroscopy and stent  placement Left 03/08/2013    Procedure: CYSTOSCOPY WITH RETROGRADE PYELOGRAM, URETEROSCOPY AND STENT PLACEMENT stone basketing;  Surgeon: Alexis Frock, MD;  Location: [facility];  Service: Urology;  Laterality: Left;  . Vaginal hysterectomy  1992  . Cholecystectomy  2002  . Laparoscopic ileocecectomy  10-09-2008    AND APPENDECTOMY (TERMINAL ILEITIS & PARTIAL SBO)  . Dx laparoscopy converted to open right colecctomy  09-15-2010    ANASTOMOTIC STRICTURE  . Cystoscopy with retrograde pyelogram, ureteroscopy and stent placement Right 04/03/2013    Procedure: [place], URETEROSCOPY AND STENT PLACEMENT;  Surgeon: Alexis Frock, MD;  Location: [facility];  Service: Urology;  Laterality: Right;    Allergies  Allergen Reactions  . Codeine Hives and Nausea And Vomiting  . Oxycodone Nausea And Vomiting    SEVERE N/V AND CRAMPING    Family history and social history have been reviewed.  Review of Systems: Soreness of the palm and mild, chronic abdominal pain, diarrhea, rectal bleeding, weight loss  The remainder of the 10 point ROS is negative except as outlined in the H&P  Physical Exam: General Appearance Well developed, in no distress, chronically ill appearing Eyes  Non icteric  HEENT  Non traumatic, normocephalic  Mouth No lesion, tongue is smooth,  Cheilosis ,  Neck Supple without adenopathy, thyroid not enlarged, no carotid bruits, no JVD  Lungs Clear to auscultation bilaterally COR Normal S1, normal S2, regular rhythm, no murmur, quiet precordium Abdomen mild diffuse tenderness. U. abnormal high-pitched bowel sounds. Very tender in epigastrium and suprapubic area Rectal and anoscopic exam reveals normal perianal area. Minor fissures within the anal canal. Painful digital exam. First grade internal hemorrhoids which are erythematous and hyperemic. No active bleeding. Stool is Hemoccult negative Extremities  No pedal edema Skin a few psoriatic   Lesions suprapubic area, umbilicus, and elbows Neurological Alert and oriented x 3 Psychological Normal mood and affect  Assessment and Plan:   Problem #1 Crohn's disease of the small bowel. Patient is on multiple medications for control. Her rectal bleeding is due to rectal irritation and hemorrhoids and will be treated with topical steroids. We will increase her Remicade to 10 mg/kg every 6 weeks and will keep her on the same prednisone and Entocort doses. She will also remain on Imuran 100 mg daily. We will refill all of her medications today. I will see her again in 8 weeks. Problem #2 chronic diarrhea. This is a combination of the bile overflow. Crohn's disease and bacterial overgrowth, she will continue on Lomotil Immunosuppression. Her psoriasis flares up when the taper the prednisone it is currently only mildly active    [name] 04/16/2013

## 2013-04-17 ENCOUNTER — Encounter (HOSPITAL_COMMUNITY)
Admission: RE | Admit: 2013-04-17 | Discharge: 2013-04-17 | Disposition: A | Discharge status: Home / Self Care | Payer: 59 | Source: Ambulatory Visit | Attending: Internal Medicine | Admitting: Internal Medicine

## 2013-04-17 ENCOUNTER — Encounter (HOSPITAL_COMMUNITY): Payer: Self-pay

## 2013-04-17 VITALS — BP 169/96 | HR 73 | Temp 98.6°F | Resp 18 | Ht 68.0 in | Wt 167.1 lb

## 2013-04-17 DIAGNOSIS — K5000 Crohn's disease of small intestine without complications: Secondary | ICD-10-CM | POA: Insufficient documentation

## 2013-04-17 DIAGNOSIS — K625 Hemorrhage of anus and rectum: Secondary | ICD-10-CM

## 2013-04-17 MED ORDER — SODIUM CHLORIDE 0.9 % IV SOLN
INTRAVENOUS | Status: DC
  Administered 2013-04-17: 10:00:00 via INTRAVENOUS

## 2013-04-17 MED ORDER — DIPHENHYDRAMINE HCL 25 MG PO CAPS
25.0000 mg | ORAL_CAPSULE | Freq: Four times a day (QID) | ORAL | Status: DC | PRN
  Administered 2013-04-17: 25 mg via ORAL
  Filled 2013-04-17: qty 1

## 2013-04-17 MED ORDER — SODIUM CHLORIDE 0.9 % IV SOLN
10.0000 mg/kg | INTRAVENOUS | Status: DC
  Administered 2013-04-17: 800 mg via INTRAVENOUS
  Filled 2013-04-17: qty 80

## 2013-04-17 MED ORDER — ACETAMINOPHEN 325 MG PO TABS
650.0000 mg | ORAL_TABLET | Freq: Four times a day (QID) | ORAL | Status: DC | PRN
  Administered 2013-04-17: 650 mg via ORAL
  Filled 2013-04-17: qty 2

## 2013-04-17 NOTE — Discharge Instructions (Signed)
Infliximab injection What is this medicine? INFLIXIMAB (in [place] i mab) is used to treat Crohn's disease and ulcerative colitis. It is also used to treat ankylosing spondylitis, psoriasis, and some forms of arthritis. This medicine may be used for other purposes; ask your health care provider or pharmacist if you have questions. COMMON BRAND NAME(S): Remicade What should I tell my health care provider before I take this medicine? They need to know if you have any of these conditions: -diabetes -exposure to tuberculosis -heart failure -hepatitis or liver disease -immune system problems -infection -lung or breathing disease, like COPD -multiple sclerosis -current or past resident of [place] or [place] -seizure disorder -an unusual or allergic reaction to infliximab, mouse proteins, other medicines, foods, dyes, or preservatives -pregnant or trying to get pregnant -breast-feeding How should I use this medicine? This medicine is for injection into a vein. It is usually given by a health care professional in a hospital or clinic setting. A special MedGuide will be given to you by the pharmacist with each prescription and refill. Be sure to read this information carefully each time. Talk to your pediatrician regarding the use of this medicine in children. Special care may be needed. Overdosage: If you think you have taken too much of this medicine contact a poison control center or emergency room at once. NOTE: This medicine is only for you. Do not share this medicine with others. What if I miss a dose? It is important not to miss your dose. Call your doctor or health care professional if you are unable to keep an appointment. What may interact with this medicine? Do not take this medicine with any of the following medications: -anakinra -rilonacept This medicine may also interact with the following medications: -vaccines This list may not describe all possible interactions.  Give your health care provider a list of all the medicines, herbs, non-prescription drugs, or dietary supplements you use. Also tell them if you smoke, drink alcohol, or use illegal drugs. Some items may interact with your medicine. What should I watch for while using this medicine? Visit your doctor or health care professional for regular checks on your progress. If you get a cold or other infection while receiving this medicine, call your doctor or health care professional. Do not treat yourself. This medicine may decrease your body's ability to fight infections. Before beginning therapy, your doctor may do a test to see if you have been exposed to tuberculosis. This medicine may make the symptoms of heart failure worse in some patients. If you notice symptoms such as increased shortness of breath or swelling of the ankles or legs, contact your health care provider right away. If you are going to have surgery or dental work, tell your health care professional or dentist that you have received this medicine. If you take this medicine for plaque psoriasis, stay out of the sun. If you cannot avoid being in the sun, wear protective clothing and use sunscreen. Do not use sun lamps or tanning beds/booths. What side effects may I notice from receiving this medicine? Side effects that you should report to your doctor or health care professional as soon as possible: -allergic reactions like skin rash, itching or hives, swelling of the face, lips, or tongue -chest pain -fever or chills, usually related to the infusion -muscle or joint pain -red, scaly patches or raised bumps on the skin -signs of infection - fever or chills, cough, sore throat, pain or difficulty passing urine -swollen lymph nodes  in the neck, underarm, or groin areas -unexplained weight loss -unusual bleeding or bruising -unusually weak or tired -yellowing of the eyes or skin Side effects that usually do not require medical attention  (report to your doctor or health care professional if they continue or are bothersome): -headache -heartburn or stomach pain -nausea, vomiting This list may not describe all possible side effects. Call your doctor for medical advice about side effects. You may report side effects to FDA at 1-800-FDA-1088. Where should I keep my medicine? This drug is given in a hospital or clinic and will not be stored at home. NOTE: This sheet is a summary. It may not cover all possible information. If you have questions about this medicine, talk to your doctor, pharmacist, or health care provider.  2014, Elsevier/Gold Standard. (2007-10-10 10:26:02)

## 2013-04-18 NOTE — Telephone Encounter (Signed)
error 

## 2013-05-27 ENCOUNTER — Telehealth: Payer: Self-pay | Admitting: Internal Medicine

## 2013-05-27 MED ORDER — HYDROCODONE-ACETAMINOPHEN 10-325 MG PO TABS: 1.0000 | ORAL_TABLET | ORAL | Status: DC | PRN

## 2013-05-27 NOTE — Telephone Encounter (Signed)
Rx created and awaiting Dr Nichola Sizer signature.

## 2013-05-28 ENCOUNTER — Telehealth: Payer: Self-pay | Admitting: *Deleted

## 2013-05-28 ENCOUNTER — Encounter: Payer: Self-pay | Admitting: Internal Medicine

## 2013-05-28 ENCOUNTER — Ambulatory Visit: Payer: PRIVATE HEALTH INSURANCE | Admitting: Internal Medicine

## 2013-05-28 ENCOUNTER — Inpatient Hospital Stay (HOSPITAL_COMMUNITY): Payer: 59

## 2013-05-28 ENCOUNTER — Other Ambulatory Visit: Payer: Self-pay | Admitting: *Deleted

## 2013-05-28 ENCOUNTER — Inpatient Hospital Stay (HOSPITAL_COMMUNITY)
Admission: AD | Admit: 2013-05-28 | Discharge: 2013-06-01 | DRG: 386 | Disposition: A | Discharge status: Home / Self Care | Payer: 59 | Source: Ambulatory Visit | Attending: Internal Medicine | Admitting: Internal Medicine

## 2013-05-28 VITALS — BP 142/110 | HR 100 | Ht 67.72 in | Wt 163.1 lb

## 2013-05-28 DIAGNOSIS — D638 Anemia in other chronic diseases classified elsewhere: Secondary | ICD-10-CM | POA: Diagnosis present

## 2013-05-28 DIAGNOSIS — K589 Irritable bowel syndrome without diarrhea: Secondary | ICD-10-CM

## 2013-05-28 DIAGNOSIS — Z800 Family history of malignant neoplasm of digestive organs: Secondary | ICD-10-CM

## 2013-05-28 DIAGNOSIS — R112 Nausea with vomiting, unspecified: Secondary | ICD-10-CM

## 2013-05-28 DIAGNOSIS — K625 Hemorrhage of anus and rectum: Secondary | ICD-10-CM

## 2013-05-28 DIAGNOSIS — R109 Unspecified abdominal pain: Secondary | ICD-10-CM

## 2013-05-28 DIAGNOSIS — Z9049 Acquired absence of other specified parts of digestive tract: Secondary | ICD-10-CM

## 2013-05-28 DIAGNOSIS — K5090 Crohn's disease, unspecified, without complications: Secondary | ICD-10-CM

## 2013-05-28 DIAGNOSIS — R1033 Periumbilical pain: Secondary | ICD-10-CM

## 2013-05-28 DIAGNOSIS — D849 Immunodeficiency, unspecified: Secondary | ICD-10-CM

## 2013-05-28 DIAGNOSIS — Z9089 Acquired absence of other organs: Secondary | ICD-10-CM

## 2013-05-28 DIAGNOSIS — E0590 Thyrotoxicosis, unspecified without thyrotoxic crisis or storm: Secondary | ICD-10-CM | POA: Diagnosis present

## 2013-05-28 DIAGNOSIS — Z87442 Personal history of urinary calculi: Secondary | ICD-10-CM

## 2013-05-28 DIAGNOSIS — Z79899 Other long term (current) drug therapy: Secondary | ICD-10-CM

## 2013-05-28 DIAGNOSIS — F411 Generalized anxiety disorder: Secondary | ICD-10-CM

## 2013-05-28 DIAGNOSIS — R197 Diarrhea, unspecified: Secondary | ICD-10-CM

## 2013-05-28 DIAGNOSIS — Z860100 Personal history of colon polyps, unspecified: Secondary | ICD-10-CM

## 2013-05-28 DIAGNOSIS — K219 Gastro-esophageal reflux disease without esophagitis: Secondary | ICD-10-CM | POA: Diagnosis present

## 2013-05-28 DIAGNOSIS — E785 Hyperlipidemia, unspecified: Secondary | ICD-10-CM | POA: Diagnosis present

## 2013-05-28 DIAGNOSIS — E43 Unspecified severe protein-calorie malnutrition: Secondary | ICD-10-CM | POA: Insufficient documentation

## 2013-05-28 DIAGNOSIS — K5000 Crohn's disease of small intestine without complications: Principal | ICD-10-CM

## 2013-05-28 DIAGNOSIS — J45909 Unspecified asthma, uncomplicated: Secondary | ICD-10-CM | POA: Diagnosis present

## 2013-05-28 DIAGNOSIS — M129 Arthropathy, unspecified: Secondary | ICD-10-CM | POA: Diagnosis present

## 2013-05-28 DIAGNOSIS — L989 Disorder of the skin and subcutaneous tissue, unspecified: Secondary | ICD-10-CM

## 2013-05-28 DIAGNOSIS — A080 Rotaviral enteritis: Secondary | ICD-10-CM

## 2013-05-28 DIAGNOSIS — I10 Essential (primary) hypertension: Secondary | ICD-10-CM | POA: Diagnosis present

## 2013-05-28 DIAGNOSIS — Z885 Allergy status to narcotic agent status: Secondary | ICD-10-CM

## 2013-05-28 DIAGNOSIS — IMO0002 Reserved for concepts with insufficient information to code with codable children: Secondary | ICD-10-CM

## 2013-05-28 DIAGNOSIS — L408 Other psoriasis: Secondary | ICD-10-CM | POA: Diagnosis present

## 2013-05-28 HISTORY — DX: Rotaviral enteritis: A08.0

## 2013-05-28 LAB — CBC
HCT: 39.7 % (ref 36.0–46.0)
Hemoglobin: 13.5 g/dL (ref 12.0–15.0)
MCH: 30.8 pg (ref 26.0–34.0)
MCHC: 34 g/dL (ref 30.0–36.0)
MCV: 90.6 fL (ref 78.0–100.0)
PLATELETS: 211 10*3/uL (ref 150–400)
RBC: 4.38 MIL/uL (ref 3.87–5.11)
RDW: 12.4 % (ref 11.5–15.5)
WBC: 6.9 10*3/uL (ref 4.0–10.5)

## 2013-05-28 LAB — COMPREHENSIVE METABOLIC PANEL
ALT: 17 U/L (ref 0–35)
AST: 26 U/L (ref 0–37)
Albumin: 4 g/dL (ref 3.5–5.2)
Alkaline Phosphatase: 99 U/L (ref 39–117)
BUN: 10 mg/dL (ref 6–23)
CALCIUM: 9.7 mg/dL (ref 8.4–10.5)
CO2: 27 mEq/L (ref 19–32)
CREATININE: 0.67 mg/dL (ref 0.50–1.10)
Chloride: 102 mEq/L (ref 96–112)
GFR calc Af Amer: >90 mL/min (ref >90)
GFR calc non Af Amer: >90 mL/min (ref >90)
Glucose, Bld: 98 mg/dL (ref 70–99)
Potassium: 3.4 mEq/L — ABNORMAL LOW (ref 3.7–5.3)
SODIUM: 140 meq/L (ref 137–147)
TOTAL PROTEIN: 7.7 g/dL (ref 6.0–8.3)
Total Bilirubin: 0.7 mg/dL (ref 0.3–1.2)

## 2013-05-28 LAB — SEDIMENTATION RATE: Sed Rate: 27 mm/hr — ABNORMAL HIGH (ref 0–22)

## 2013-05-28 LAB — PHOSPHORUS: PHOSPHORUS: 3.9 mg/dL (ref 2.3–4.6)

## 2013-05-28 LAB — PROTIME-INR
INR: 0.99 (ref 0.00–1.49)
Prothrombin Time: 12.9 seconds (ref 11.6–15.2)

## 2013-05-28 MED ORDER — AZATHIOPRINE 50 MG PO TABS
100.0000 mg | ORAL_TABLET | Freq: Every day | ORAL | Status: DC
  Administered 2013-05-28 – 2013-06-01 (×5): 100 mg via ORAL
  Filled 2013-05-28 (×5): qty 2

## 2013-05-28 MED ORDER — ONDANSETRON HCL 4 MG/2ML IJ SOLN
4.0000 mg | Freq: Four times a day (QID) | INTRAMUSCULAR | Status: DC | PRN
  Administered 2013-05-29: 4 mg via INTRAVENOUS
  Filled 2013-05-28: qty 2

## 2013-05-28 MED ORDER — SODIUM CHLORIDE 0.9 % IV SOLN
10.0000 mg/kg | INTRAVENOUS | Status: DC
  Administered 2013-05-29: 800 mg via INTRAVENOUS
  Filled 2013-05-28: qty 80

## 2013-05-28 MED ORDER — KCL IN DEXTROSE-NACL 20-5-0.45 MEQ/L-%-% IV SOLN
INTRAVENOUS | Status: DC
Start: 2013-05-28 — End: 2013-06-01
  Administered 2013-05-28 – 2013-06-01 (×8): via INTRAVENOUS
  Filled 2013-05-28 (×12): qty 1000

## 2013-05-28 MED ORDER — METHYLPREDNISOLONE SODIUM SUCC 40 MG IJ SOLR
40.0000 mg | INTRAMUSCULAR | Status: DC
  Filled 2013-05-28: qty 1

## 2013-05-28 MED ORDER — HYDROMORPHONE HCL PF 1 MG/ML IJ SOLN
0.5000 mg | INTRAMUSCULAR | Status: DC | PRN
  Administered 2013-05-28: 0.5 mg via INTRAVENOUS
  Filled 2013-05-28: qty 1

## 2013-05-28 MED ORDER — THIAMINE HCL 100 MG/ML IJ SOLN
Freq: Once | INTRAVENOUS | Status: AC
  Administered 2013-05-28: 12:00:00 via INTRAVENOUS
  Filled 2013-05-28: qty 1000

## 2013-05-28 MED ORDER — ACETAMINOPHEN 325 MG PO TABS: 650.0000 mg | ORAL_TABLET | Freq: Four times a day (QID) | ORAL | Status: DC | PRN

## 2013-05-28 MED ORDER — ACETAMINOPHEN 650 MG RE SUPP: 650.0000 mg | Freq: Four times a day (QID) | RECTAL | Status: DC | PRN

## 2013-05-28 MED ORDER — ADULT MULTIVITAMIN W/MINERALS CH
1.0000 | ORAL_TABLET | Freq: Every day | ORAL | Status: DC
  Administered 2013-05-29 – 2013-06-01 (×4): 1 via ORAL
  Filled 2013-05-28 (×4): qty 1

## 2013-05-28 MED ORDER — DIPHENHYDRAMINE HCL 25 MG PO CAPS: 25.0000 mg | ORAL_CAPSULE | Freq: Four times a day (QID) | ORAL | Status: DC | PRN

## 2013-05-28 MED ORDER — ONDANSETRON HCL 4 MG PO TABS
4.0000 mg | ORAL_TABLET | Freq: Four times a day (QID) | ORAL | Status: DC | PRN
  Filled 2013-05-28 (×2): qty 1

## 2013-05-28 MED ORDER — SODIUM CHLORIDE 0.9 % IV SOLN
INTRAVENOUS | Status: DC
  Administered 2013-05-29: 10:00:00 via INTRAVENOUS

## 2013-05-28 MED ORDER — SODIUM CHLORIDE 0.9 % IV SOLN: INTRAVENOUS | Status: DC

## 2013-05-28 MED ORDER — PROMETHAZINE HCL 25 MG/ML IJ SOLN: 12.5000 mg | Freq: Once | INTRAMUSCULAR | Status: DC

## 2013-05-28 MED ORDER — ATENOLOL 25 MG PO TABS
25.0000 mg | ORAL_TABLET | Freq: Every day | ORAL | Status: DC
Start: 2013-05-28 — End: 2013-06-01
  Administered 2013-05-28 – 2013-06-01 (×5): 25 mg via ORAL
  Filled 2013-05-28 (×5): qty 1

## 2013-05-28 MED ORDER — BUDESONIDE-FORMOTEROL FUMARATE 80-4.5 MCG/ACT IN AERO
2.0000 | INHALATION_SPRAY | Freq: Two times a day (BID) | RESPIRATORY_TRACT | Status: DC
Start: 2013-05-28 — End: 2013-06-01
  Administered 2013-05-28 – 2013-06-01 (×8): 2 via RESPIRATORY_TRACT
  Filled 2013-05-28: qty 6.9

## 2013-05-28 MED ORDER — ENOXAPARIN SODIUM 40 MG/0.4ML ~~LOC~~ SOLN
40.0000 mg | SUBCUTANEOUS | Status: DC
  Administered 2013-05-28 – 2013-05-31 (×4): 40 mg via SUBCUTANEOUS
  Filled 2013-05-28 (×5): qty 0.4

## 2013-05-28 MED ORDER — PROMETHAZINE HCL 25 MG/ML IJ SOLN
25.0000 mg | Freq: Four times a day (QID) | INTRAMUSCULAR | Status: DC | PRN
  Administered 2013-05-28 – 2013-06-01 (×12): 25 mg via INTRAVENOUS
  Filled 2013-05-28 (×12): qty 1

## 2013-05-28 MED ORDER — ZOLPIDEM TARTRATE 5 MG PO TABS: 5.0000 mg | ORAL_TABLET | Freq: Every evening | ORAL | Status: DC | PRN

## 2013-05-28 MED ORDER — HYDROMORPHONE HCL PF 1 MG/ML IJ SOLN
1.0000 mg | INTRAMUSCULAR | Status: DC | PRN
  Administered 2013-05-28 – 2013-06-01 (×18): 1 mg via INTRAVENOUS
  Filled 2013-05-28 (×17): qty 1

## 2013-05-28 MED ORDER — HYDROMORPHONE HCL PF 1 MG/ML IJ SOLN
INTRAMUSCULAR | Status: AC
  Filled 2013-05-28: qty 1

## 2013-05-28 MED ORDER — ALBUTEROL SULFATE (2.5 MG/3ML) 0.083% IN NEBU: 3.0000 mL | INHALATION_SOLUTION | RESPIRATORY_TRACT | Status: DC | PRN

## 2013-05-28 MED ORDER — ENSURE COMPLETE PO LIQD
237.0000 mL | Freq: Two times a day (BID) | ORAL | Status: DC
  Administered 2013-05-29: 237 mL via ORAL

## 2013-05-28 MED ORDER — ALPRAZOLAM 0.5 MG PO TABS
0.5000 mg | ORAL_TABLET | Freq: Three times a day (TID) | ORAL | Status: DC | PRN
  Administered 2013-05-28 – 2013-05-30 (×4): 0.5 mg via ORAL
  Filled 2013-05-28 (×4): qty 1

## 2013-05-28 MED ORDER — SODIUM CHLORIDE 0.9 % IV SOLN: 10.0000 mg/kg | INTRAVENOUS | Status: DC

## 2013-05-28 MED ORDER — METHYLPREDNISOLONE SODIUM SUCC 125 MG IJ SOLR
60.0000 mg | Freq: Every day | INTRAMUSCULAR | Status: DC
  Administered 2013-05-28: 62.5 mg via INTRAVENOUS
  Administered 2013-05-29 – 2013-05-30 (×2): 60 mg via INTRAVENOUS
  Filled 2013-05-28 (×3): qty 0.96

## 2013-05-28 MED ORDER — LOPERAMIDE HCL 2 MG PO CAPS: 2.0000 mg | ORAL_CAPSULE | ORAL | Status: DC | PRN

## 2013-05-28 MED ORDER — GADOBENATE DIMEGLUMINE 529 MG/ML IV SOLN
15.0000 mL | Freq: Once | INTRAVENOUS | Status: AC | PRN
  Administered 2013-05-28: 15 mL via INTRAVENOUS

## 2013-05-28 NOTE — Patient Instructions (Signed)
You are being admitted to [facility]. Please go to admitting (1st floor) and tell them that you are a "direct admit." You will be going to [place].  CC:Dr [employer]

## 2013-05-28 NOTE — Progress Notes (Signed)
[name] Dec 16, 1960 [id number]  Note: This dictation was prepared with Dragon digital system. Any transcriptional errors that result from this procedure are unintentional.   History of Present Illness: This is a 53 year old white female with complicated Crohn's disease of the small bowel diagnosed in 2007. Her last office visit with [place] was in February 2014. She has been losing weight. She weighed 213 pounds in February 2014, 193 pounds in August 2014 and 174 pounds in Feb 2015. She is 162 lbs today. She is having diarrhea, nausea and a decreased appetite. We have continued Prednisone  10 mg a day in addition to a maintenance dose of budesonide 9 mg a day. She has been on Imuran 100 mg daily and Remicade infusions 10 mg/kg every 6 weeks. She has nausea and abdominal pain. She denies rectal bleeding. Her last sedimentation rate was elevated at 42 and serum carotene was less than 2, suggesting malabsorption. Albumin was 3.5. She has not noticed any dramatic improvement in the past with increased steroids. A CT scan of the abdomen in July 2014 and 03/29/2013 to look for kidney stones, without IV contrast showed mild hydronephrosis. Her last colonoscopy in November 2012 showed a wide open anastomosis and adenomatous polyp. Her bone density is normal. Her hepatitis A and B serologies are negative. She has had a low grade fever of 99.6 F.Last PPD skin test was in Aug 2014.     Past Medical History  Diagnosis Date  . Crohn's disease   . Depression   . GERD (gastroesophageal reflux disease)   . Vitamin B12 deficiency   . Psoriasis     SKIN  . Family history of malignant neoplasm of gastrointestinal tract   . Crohn's colitis   . PONV (postoperative nausea and vomiting)   . Hypertension   . H/O hiatal hernia   . Seasonal asthma   . S/P dilatation of esophageal stricture   . History of adenomatous polyp of colon   . History of small bowel obstruction     SECONDARY CROHN'S STRICTURE  S/P  COLECTOMY  . History of gastritis     AND HX ILEITIS  . Chronic diarrhea   . Internal hemorrhoids   . History of kidney stones   . Right ureteral stone   . Hypopotassemia   . Chronic disease anemia   . Urgency of urination   . Arthritis   . Hyperlipidemia     Past Surgical History  Procedure Laterality Date  . Carpal tunnel release Right 2000  . Anal fissure repair  1990's  . Cystoscopy with retrograde pyelogram, ureteroscopy and stent placement Left 03/08/2013    Procedure: CYSTOSCOPY WITH RETROGRADE PYELOGRAM, URETEROSCOPY AND STENT PLACEMENT stone basketing;  Surgeon: Alexis Frock, MD;  Location: [facility];  Service: Urology;  Laterality: Left;  . Vaginal hysterectomy  1992  . Cholecystectomy  2002  . Laparoscopic ileocecectomy  10-09-2008    AND APPENDECTOMY (TERMINAL ILEITIS & PARTIAL SBO)  . Dx laparoscopy converted to open right colecctomy  09-15-2010    ANASTOMOTIC STRICTURE  . Cystoscopy with retrograde pyelogram, ureteroscopy and stent placement Right 04/03/2013    Procedure: [place], URETEROSCOPY AND STENT PLACEMENT;  Surgeon: Alexis Frock, MD;  Location: [facility];  Service: Urology;  Laterality: Right;    Allergies  Allergen Reactions  . Codeine Hives and Nausea And Vomiting  . Oxycodone Nausea And Vomiting    SEVERE N/V AND CRAMPING    Family history and social history  have been reviewed.  Review of Systems:   The remainder of the 10 point ROS is negative except as outlined in the H&P  Physical Exam: General Appearance chronically ill-appearing in no distress Eyes  Non icteric  HEENT  Non traumatic, normocephalic  Mouth No lesion, tongue rugated  Cheilosis both mouth corners Neck Supple without adenopathy, thyroid not enlarged, no carotid bruits, no JVD Lungs Clear to auscultation bilaterally COR Normal S1, normal S2, regular rhythm, no murmur, quiet precordium Abdomen soft diffusely tender but soft bowel  sounds. No distention or tympany. No palpable mass no rebound Rectal patient would not allow digital exam the 2 severe tenderness and irritation Extremities  No pedal edema Skin psoriatic lesions abdomen Neurological Alert and oriented x 3 Psychological Normal mood and affect  Assessment and Plan:   Problem #1 Severe Crohn's disease of the small bowel is not responding to medical therapy. She has continued weight loss, nausea or vomiting. Her small bowel capsule endoscopy was never completed because of the capsule being retained in the distal small bowel due to severe disease. She is not clinically obstructed. She shows signs of vitamin deficiency and malnutrition. She will be admitted today and may benefit from TNA to replenish vitamins, fats and trace elements. Because of her immunosuppression, I would look into opportunistic infections such as CMV or lymphoma. We will proceed with a CT scan of the abdomen while maintaining her diet which usually consists of carbohydrates and starches. We will increase her prednisone to intravenous Solu-Medrol and continue her Remicade, Imuran and budesonide. She needs pain control for crampy abdominal pain. #2 Psoriasis- activated by remicade infusions #3 Weight loss of over 50 lbs in past year- ? Malabsorption? #4 Chronic diarrhea- ,post TI resection, Crohn's disease, post cholecystectoymy, ? Bacterial overgrowth We will admit.  [name] 05/28/2013

## 2013-05-28 NOTE — H&P (Signed)
Note: This dictation was prepared with Biomedical engineer system. Any transcriptional errors that result from this procedure are unintentional.  History of Present Illness:  This is a 53 year old white female with complicated Crohn's disease of the small bowel diagnosed in 2007. Her last office visit with [place] was in February 2014. She has been losing weight. She weighed 213 pounds in February 2014, 193 pounds in August 2014 and 174 pounds in Feb 2015. She is 162 lbs today. She is having diarrhea, nausea and a decreased appetite. We have continued Prednisone 10 mg a day in addition to a maintenance dose of budesonide 9 mg a day. She has been on Imuran 100 mg daily and Remicade infusions 10 mg/kg every 6 weeks. She has nausea and abdominal pain. She denies rectal bleeding. Her last sedimentation rate was elevated at 42 and serum carotene was less than 2, suggesting malabsorption. Albumin was 3.5. She has not noticed any dramatic improvement in the past with increased steroids. A CT scan of the abdomen in July 2014 and 03/29/2013 to look for kidney stones, without IV contrast showed mild hydronephrosis. Her last colonoscopy in November 2012 showed a wide open anastomosis and adenomatous polyp. Her bone density is normal. Her hepatitis A and B serologies are negative. She has had a low grade fever of 99.6 F.Last PPD skin test was in Aug 2014.  Past Medical History   Diagnosis  Date   .  Crohn's disease    .  Depression    .  GERD (gastroesophageal reflux disease)    .  Vitamin B12 deficiency    .  Psoriasis      SKIN   .  Family history of malignant neoplasm of gastrointestinal tract    .  Crohn's colitis    .  PONV (postoperative nausea and vomiting)    .  Hypertension    .  H/O hiatal hernia    .  Seasonal asthma    .  S/P dilatation of esophageal stricture    .  History of adenomatous polyp of colon    .  History of small bowel obstruction      SECONDARY CROHN'S STRICTURE S/P COLECTOMY   .  History of  gastritis      AND HX ILEITIS   .  Chronic diarrhea    .  Internal hemorrhoids    .  History of kidney stones    .  Right ureteral stone    .  Hypopotassemia    .  Chronic disease anemia    .  Urgency of urination    .  Arthritis    .  Hyperlipidemia     Past Surgical History   Procedure  Laterality  Date   .  Carpal tunnel release  Right  2000   .  Anal fissure repair   1990's   .  Cystoscopy with retrograde pyelogram, ureteroscopy and stent placement  Left  03/08/2013     Procedure: CYSTOSCOPY WITH RETROGRADE PYELOGRAM, URETEROSCOPY AND STENT PLACEMENT stone basketing; Surgeon: Alexis Frock, MD; Location: [facility]; Service: Urology; Laterality: Left;   .  Vaginal hysterectomy   1992   .  Cholecystectomy   2002   .  Laparoscopic ileocecectomy   10-09-2008     AND APPENDECTOMY (TERMINAL ILEITIS & PARTIAL SBO)   .  Dx laparoscopy converted to open right colecctomy   09-15-2010     ANASTOMOTIC STRICTURE   .  Cystoscopy with retrograde pyelogram, ureteroscopy and stent  placement  Right  04/03/2013     Procedure: [place], URETEROSCOPY AND STENT PLACEMENT; Surgeon: Alexis Frock, MD; Location: [facility]; Service: Urology; Laterality: Right;    Allergies   Allergen  Reactions   .  Codeine  Hives and Nausea And Vomiting   .  Oxycodone  Nausea And Vomiting     SEVERE N/V AND CRAMPING    Family history and social history have been reviewed.  Review of Systems:  The remainder of the 10 point ROS is negative except as outlined in the H&P  Physical Exam:  General Appearance chronically ill-appearing in no distress  Eyes Non icteric  HEENT Non traumatic, normocephalic  Mouth No lesion, tongue rugated Cheilosis both mouth corners  Neck Supple without adenopathy, thyroid not enlarged, no carotid bruits, no JVD  Lungs Clear to auscultation bilaterally  COR Normal S1, normal S2, regular rhythm, no murmur, quiet precordium  Abdomen soft diffusely  tender but soft bowel sounds. No distention or tympany. No palpable mass no rebound  Rectal patient would not allow digital exam the 2 severe tenderness and irritation  Extremities No pedal edema  Skin psoriatic lesions abdomen  Neurological Alert and oriented x 3  Psychological Normal mood and affect  Assessment and Plan:  Problem #1 Severe Crohn's disease of the small bowel is not responding to medical therapy. She has continued weight loss, nausea or vomiting. Her small bowel capsule endoscopy was never completed because of the capsule being retained in the distal small bowel due to severe disease. She is not clinically obstructed. She shows signs of vitamin deficiency and malnutrition. She will be admitted today and may benefit from TNA to replenish vitamins, fats and trace elements. Because of her immunosuppression, I would look into opportunistic infections such as CMV or lymphoma. We will proceed with a CT scan of the abdomen while maintaining her diet which usually consists of carbohydrates and starches. We will increase her prednisone to intravenous Solu-Medrol and continue her Remicade, Imuran and budesonide. She needs pain control for crampy abdominal pain.  #2 Psoriasis- activated by remicade infusions  #3 Weight loss of over 50 lbs in past year- ? Malabsorption?  #4 Chronic diarrhea- ,post TI resection, Crohn's disease, post cholecystectoymy, ? Bacterial overgrowth  We will admit.  [name]  05/28/2013

## 2013-05-28 NOTE — Telephone Encounter (Signed)
Spoke with PA at patient's PCP in [place]. Patient is in the office there with a flare. He is asking if patient should go to ED. Patient scheduled for OV with Dr. Olevia Perches at 10:45 AM today. He will send her now.

## 2013-05-29 ENCOUNTER — Encounter (HOSPITAL_COMMUNITY): Payer: 59

## 2013-05-29 DIAGNOSIS — K589 Irritable bowel syndrome without diarrhea: Secondary | ICD-10-CM

## 2013-05-29 LAB — GI PATHOGEN PANEL BY PCR, STOOL
C difficile toxin A/B: NEGATIVE
CAMPYLOBACTER BY PCR: NEGATIVE
Cryptosporidium by PCR: NEGATIVE
E COLI (ETEC) LT/ST: NEGATIVE
E coli (STEC): NEGATIVE
E coli 0157 by PCR: NEGATIVE
G LAMBLIA BY PCR: NEGATIVE
NOROVIRUS G1/G2: NEGATIVE
Rotavirus A by PCR: POSITIVE
Salmonella by PCR: NEGATIVE
Shigella by PCR: NEGATIVE

## 2013-05-29 LAB — MAGNESIUM: Magnesium: 1.7 mg/dL (ref 1.5–2.5)

## 2013-05-29 LAB — PREALBUMIN: Prealbumin: 29.7 mg/dL (ref 17.0–34.0)

## 2013-05-29 LAB — FOLATE: Folate: 18.2 ng/mL

## 2013-05-29 LAB — VITAMIN B12: Vitamin B-12: 622 pg/mL (ref 211–911)

## 2013-05-29 MED ORDER — BOOST / RESOURCE BREEZE PO LIQD
1.0000 | Freq: Three times a day (TID) | ORAL | Status: DC
  Administered 2013-05-29 – 2013-05-31 (×4): 1 via ORAL

## 2013-05-29 MED ORDER — RIFAXIMIN 550 MG PO TABS
550.0000 mg | ORAL_TABLET | Freq: Two times a day (BID) | ORAL | Status: DC
  Administered 2013-05-29 – 2013-06-01 (×6): 550 mg via ORAL
  Filled 2013-05-29 (×8): qty 1

## 2013-05-29 MED ORDER — LOPERAMIDE HCL 2 MG PO CAPS: 4.0000 mg | ORAL_CAPSULE | ORAL | Status: DC | PRN

## 2013-05-29 NOTE — Progress Notes (Signed)
Patient ID: [name], female   DOB: [birth year], 53 y.o.   MRN: [id number] [facility] Progress Note  Subjective: Feels Ok-diarrhea x 8 last night-small squirts...cramping this am-thinks contrast may have aggravated her.  MRI report pending Eating without difficulty  Objective:  Vital signs in last 24 hours: Temp:  [98.3 F (36.8 C)-98.7 F (37.1 C)] 98.3 F (36.8 C) (03/25 0646) Pulse Rate:  [65-100] 65 (03/25 0646) Resp:  [16-18] 16 (03/25 0646) BP: (140-172)/(72-110) 140/72 mmHg (03/25 0646) SpO2:  [94 %-100 %] 99 % (03/25 0646) Weight:  [163 lb 2 oz (73.993 kg)] 163 lb 2 oz (73.993 kg) (03/24 1212) Last BM Date: 05/29/13 General:   Alert,  Well-developed,  WF   in NAD Heart:  Regular rate and rhythm; no murmurs Pulm;clear Abdomen:  Soft,tender across mid abdomen and nondistended. Normal bowel sounds, without guarding, and without rebound.   Extremities:  Without edema. Neurologic:  Alert and  oriented x4;  grossly normal neurologically. Psych:  Alert and cooperative. Normal mood and affect.     Lab Results: MG and phosph levels normal Carotene  Pending Gi pathogen -pending Albumin 4.0, and prealbumin 29 !  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,s/p ileal resection and partial colectomy with weight loss abdominal pain, and chronic progressive diarrhea- await MRI Continue IV solumedrol 60 daily Continue IMuran I suspect Remicade has lost teffectiveness- she has been on long term, prob need to switch to another drug/Humira If pathogen panel negative will  treat for potential bact overgrowth Interesting -her nutritional parameters look great- does not need TPN, continue regular diet and supplements Active Problems:   Crohn's disease involving terminal ileum     LOS: 1 day   Amy Esterwood  05/29/2013, 9:04 AM   [facility] Attending  I have also seen and assessed the patient and agree with the above note. Eating in bed, NAD Abd is benign  Agree that likely Remicade has lost effectiveness and changing agents appropriate. May need to check anti-TNF antibodies first - will see if we can get here in [place] She may also have IBS on top of IBD (think so)  [name], MD, [facility] [phone] (pager) 05/29/2013 12:34 PM

## 2013-05-29 NOTE — Progress Notes (Signed)
Chaplain support in response to spiritual care consult.   Provided emotional and spiritual support with pt and husband, [name].  [name] experiencing shame / loss of humanity / dignity and loss of time with family and grandchildren due to chronic illness, grief around illness, death of ex-husband and inability for children to know him due to substance abuse, death of two dogs (67,17y).  Chaplain provided support, prayed with pt, facilitated supportive conversation among pt and spouse.   Will continue to follow for support while admitted to [facility].  Please page if significant changes or if needs arise.   [place], [place]

## 2013-05-29 NOTE — Progress Notes (Signed)
INITIAL NUTRITION ASSESSMENT  Pt meets criteria for severe MALNUTRITION in the context of chronic illness as evidenced by 23.4% weight loss in the past year per GI notes with severe muscle wasting in upper arms with moderate muscle wasting in legs per nutrition focused physical exam.  DOCUMENTATION CODES Per approved criteria  -Severe malnutrition in the context of chronic illness   INTERVENTION: - Resource Breeze TID - Discussed nutrition therapy for Crohn's disease with pt and husband (low fiber, avoid lactose, stay hydrated)  - Recommend MD d/c Zofran as pt does not take it/want it as she states it does not work for her, states only Phenergan controls her nausea  - Will continue to monitor  NUTRITION DIAGNOSIS: Altered GI function related to ongoing nausea, vomiting, and diarrhea as evidenced by MD notes, pt report.   Goal: 1. No further diarrhea, nausea, or vomiting 2. Pt to consume >90% of meals/supplements  Monitor:  Weights, labs, intake, nausea, vomiting, diarrhea  Reason for Assessment: Malnutrition screening tool   53 y.o. female  Admitting Dx: Severe Crohn's disease  ASSESSMENT: Pt with complicated Crohn's disease of the small bowel diagnosed in 2007. She has been losing weight. She weighed 213 pounds in February 2014, 193 pounds in August 2014 and 174 pounds in Feb 2015. She is 163 lbs today. She is having diarrhea, nausea and a decreased appetite. Pt has been on Prednisone 10 mg a day in addition to a maintenance dose of budesonide 9 mg a day. She has been on Imuran 100 mg daily and Remicade infusions 10 mg/kg every 6 weeks. She has nausea and abdominal pain.   Met with pt who reports typically eating 2 meals/day of things like potatoes, pasta with gravy, and soft meats. Eats toast for breakfast. Doesn't eat corn due to Crohn's disease or raw fruit. Says that vegetables "mess her up". Was encouraged by MD to start taking a multivitamin. Drinks 16 ounces of cranberry  juice and 8 ounces of Sprite daily. Chews on ice. Pt not on any nutritional supplements at home.   Pt reports having diarrhea for the past 8 years. States on a good day this will occur 5-8 times, on a bad day it will be over 20 times. Also c/o having nausea for years which she takes Phenergan at home for which provides her with relief. C/o throwing up twice/day for years, states even water will make her vomit. States she has a large hiatal hernia and c/o esophageal dysphagia and states she had her esophagus stretched last year.   C/o significant and progressive muscle weakness due to unintended weight loss. 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 Only sleeps 2 hours at night. States she was on medicine to help her sleep, Lunesta, but it doesn't work.   C/o nausea, notified RN. Observed that pt ate 75% of lunch tray, with 100% of [place] and gravy consumed. Not on any nutritional supplements at home but willing to try [employer]. Reports 8 episodes of diarrhea overnight.   Nutrition Focused Physical Exam:  Subcutaneous Fat:  Orbital Region: mild/moderate muscle wasting and fat loss Upper Arm Region: severe muscle wasting Thoracic and Lumbar Region: WNL  Muscle:  Temple Region: WNL Clavicle Bone Region: WNL Clavicle and Acromion Bone Region: WNL Scapular Bone Region: NA Dorsal Hand: WNL Patellar Region: mild/moderate muscle wasting Anterior Thigh Region: mild/moderate muscle wasting Posterior Calf Region: mild/moderate muscle wasting  Edema:  None noted  Getting multivitamin, PRN Zofran, and PRN and scheduled Phenergan (pt states Zofran doesn't work, only Phenergan works)  Potassium low, getting replacement in IVF  Height: Ht Readings from Last 1 Encounters:  05/28/13 5' 7.72" (1.72 m)    Weight: Wt Readings from Last 1 Encounters:  05/28/13 163 lb 2 oz (73.993 kg)    Ideal Body Weight: 140 lbs  % Ideal  Body Weight: 116%  Wt Readings from Last 10 Encounters:  05/28/13 163 lb 2 oz (73.993 kg)  05/28/13 163 lb 2 oz (73.993 kg)  04/17/13 167 lb 2 oz (75.807 kg)  04/16/13 168 lb 9.6 oz (76.476 kg)  04/03/13 168 lb 8 oz (76.431 kg)  04/03/13 168 lb 8 oz (76.431 kg)  03/06/13 174 lb (78.926 kg)  03/06/13 174 lb 8 oz (79.153 kg)  02/19/13 175 lb 6 oz (79.55 kg)  01/23/13 177 lb 8 oz (80.513 kg)    Usual Body Weight: 213 lbs in February 2014  % Usual Body Weight: 76%  BMI:  Body mass index is 25.01 kg/(m^2).  Estimated Nutritional Needs: Kcal: 1850-2000 Protein: 75-90g Fluid: 1.8-2L/day  Skin: Intact  Diet Order: General  EDUCATION NEEDS: -No education needs identified at this time   Intake/Output Summary (Last 24 hours) at 05/29/13 1614 Last data filed at 05/29/13 0926  Gross per 24 hour  Intake    240 ml  Output      0 ml  Net    240 ml    Last BM: 3/25  Labs:   Recent Labs Lab 05/28/13 1228 05/29/13 0437  NA 140  --   K 3.4*  --   CL 102  --   CO2 27  --   BUN 10  --   CREATININE 0.67  --   CALCIUM 9.7  --   MG  --  1.7  PHOS 3.9  --   GLUCOSE 98  --     CBG (last 3)  No results found for this basename: GLUCAP,  in the last 72 hours  Scheduled Meds: . sodium chloride   Intravenous Q6 weeks  . atenolol  25 mg Oral Daily  . azaTHIOprine  100 mg Oral Daily  . budesonide-formoterol  2 puff Inhalation BID  . enoxaparin (LOVENOX) injection  40 mg Subcutaneous Q24H  . feeding supplement (ENSURE COMPLETE)  237 mL Oral BID BM  . inFLIXimab (REMICADE) infusion  10 mg/kg Intravenous Q6 weeks  . methylPREDNISolone (SOLU-MEDROL) injection  60 mg Intravenous Daily  . multivitamin with minerals  1 tablet Oral Daily  . promethazine  12.5-25 mg Intravenous Once  . rifaximin  550 mg Oral BID    Continuous Infusions: . dextrose 5 % and 0.45 % NaCl with KCl 20 mEq/L 100 mL/hr at 05/28/13 1541    Past Medical History  Diagnosis Date  . Crohn's disease   .  Depression   . GERD (gastroesophageal reflux disease)   . Vitamin B12 deficiency   . Psoriasis     SKIN  . Family history of malignant neoplasm of gastrointestinal tract   . Crohn's colitis   . PONV (postoperative nausea and vomiting)   . Hypertension   . H/O hiatal hernia   . Seasonal asthma   . S/P dilatation of esophageal stricture   . History of adenomatous polyp of colon   . History of small bowel obstruction     SECONDARY CROHN'S STRICTURE  S/P COLECTOMY  . History of gastritis  AND HX ILEITIS  . Chronic diarrhea   . Internal hemorrhoids   . History of kidney stones   . Right ureteral stone   . Hypopotassemia   . Chronic disease anemia   . Urgency of urination   . Arthritis   . Hyperlipidemia     Past Surgical History  Procedure Laterality Date  . Carpal tunnel release Right 2000  . Anal fissure repair  1990's  . Cystoscopy with retrograde pyelogram, ureteroscopy and stent placement Left 03/08/2013    Procedure: CYSTOSCOPY WITH RETROGRADE PYELOGRAM, URETEROSCOPY AND STENT PLACEMENT stone basketing;  Surgeon: Alexis Frock, MD;  Location: [facility];  Service: Urology;  Laterality: Left;  . Vaginal hysterectomy  1992  . Cholecystectomy  2002  . Laparoscopic ileocecectomy  10-09-2008    AND APPENDECTOMY (TERMINAL ILEITIS & PARTIAL SBO)  . Dx laparoscopy converted to open right colecctomy  09-15-2010    ANASTOMOTIC STRICTURE  . Cystoscopy with retrograde pyelogram, ureteroscopy and stent placement Right 04/03/2013    Procedure: [place], URETEROSCOPY AND STENT PLACEMENT;  Surgeon: Alexis Frock, MD;  Location: [facility];  Service: Urology;  Laterality: Right;    Mikey College MS, [place], [place] Pager [phone] After Hours Pager

## 2013-05-30 ENCOUNTER — Encounter (HOSPITAL_COMMUNITY): Payer: Self-pay

## 2013-05-30 DIAGNOSIS — R1033 Periumbilical pain: Secondary | ICD-10-CM

## 2013-05-30 DIAGNOSIS — E43 Unspecified severe protein-calorie malnutrition: Secondary | ICD-10-CM | POA: Insufficient documentation

## 2013-05-30 LAB — TSH: TSH: 0.313 u[IU]/mL — ABNORMAL LOW (ref 0.350–4.500)

## 2013-05-30 MED ORDER — DICYCLOMINE HCL 20 MG PO TABS
20.0000 mg | ORAL_TABLET | Freq: Three times a day (TID) | ORAL | Status: DC
  Administered 2013-05-30 – 2013-06-01 (×6): 20 mg via ORAL
  Filled 2013-05-30 (×8): qty 1

## 2013-05-30 NOTE — Progress Notes (Signed)
Follow up with pt for continued support.

## 2013-05-30 NOTE — Progress Notes (Addendum)
Progress Note   Subjective  nausea and diffuse abdominal pain. Made herself eat toast and part of blueberry muffin for breakfast. BMs are at baseline with 5 loose stools since 5am.    Objective   Vital signs in last 24 hours: Temp:  [97.7 F (36.5 C)-98.3 F (36.8 C)] 98 F (36.7 C) (03/26 0730) Pulse Rate:  [59-83] 63 (03/26 0730) Resp:  [16-18] 18 (03/26 0730) BP: (135-189)/(63-107) 178/82 mmHg (03/26 0730) SpO2:  [95 %-99 %] 98 % (03/26 0919) Last BM Date: 05/30/13 General:    white female in NAD Heart:  Regular rate and rhythm Lungs: Respirations even and unlabored, lungs CTA bilaterally Abdomen:  Soft, nontender and nondistended. Normal bowel sounds. Extremities:  Without edema. Neurologic:  Alert and oriented,  grossly normal neurologically. Psych:  Cooperative. Normal mood and affect.    Lab Results:  Recent Labs  05/28/13 1228  WBC 6.9  HGB 13.5  HCT 39.7  PLT 211   BMET  Recent Labs  05/28/13 1228  NA 140  K 3.4*  CL 102  CO2 27  GLUCOSE 98  BUN 10  CREATININE 0.67  CALCIUM 9.7   LFT  Recent Labs  05/28/13 1228  PROT 7.7  ALBUMIN 4.0  AST 26  ALT 17  ALKPHOS 99  BILITOT 0.7   PT/INR  Recent Labs  05/28/13 1228  LABPROT 12.9  INR 0.99    Studies/Results:  Mr [name] W/o W/cm  05/29/2013   CLINICAL DATA:  Crohn's disease with nausea and vomiting. Weight loss.  EXAM: MR ABDOMEN AND PELVIS WITHOUT AND WITH CONTRAST (MR ENTEROGRAPHY)  TECHNIQUE: Multiplanar, multisequence MRI of the abdomen and pelvis was performed both before and during bolus administration of intravenous contrast. Negative oral contrast VoLumen was given.  CONTRAST:  45m MULTIHANCE GADOBENATE DIMEGLUMINE 529 MG/ML IV SOLN  COMPARISON:  03/29/2013  FINDINGS: MR ABDOMEN AND PELVIS FINDINGS  No focal abnormality is seen in the liver or spleen. The pancreas is unremarkable. Gallbladder is surgically absent. Adrenal glands are normal. No focal abnormality is seen in  either kidney. The hydronephrosis present in the right kidney on the previous CT scan has resolved in the interval. Flow artifact is seen at the UPJ of each kidney on axial T2 weighted imaging.  Imaging after IV contrast administration shows no abnormal enhancement or wall thickening in the stomach or duodenum. Distention of left upper quadrant small bowel is compatible with the enteric contrast administration. No abnormal proximal small bowel enhancement. In the right lower quadrant, there is a loop of ileum which is nondistended and demonstrates increased revealing increased signal intensity on postcontrast imaging. On precontrast imaging, there is no evidence for bowel wall thickening associated with this bowel and there is no mesenteric edema or fluid associated. As such, I think the increased signal intensity on the postcontrast imaging is related to underdistention and apposition of the bowel wall surfaces rather than true pathologic hyper enhancement.  Side-by-side enterocolic anastomosis is identified in this patient status post right hemicolectomy. The anastomosis is patent.  No abdominal aortic aneurysm. No evidence for free fluid or lymphadenopathy in the abdomen  No abnormal marrow signal is identified within the visualized bony structures.  IMPRESSION: No abnormal wall thickening or enhancement identified on today's study. A short segment of distal small bowel which is well distended with thin wall on the pre contrast imaging shows under distension on postcontrast imaging, felt to be related to peristalsis. There is no edema or fluid  within the adjacent mesentery.   Electronically Signed   By: Misty Stanley M.D.   On: 05/29/2013 10:46   Mr [place] Pelvis W/o W/cm  05/29/2013   CLINICAL DATA:  Crohn's disease with nausea and vomiting. Weight loss.  EXAM: MR ABDOMEN AND PELVIS WITHOUT AND WITH CONTRAST (MR ENTEROGRAPHY)  TECHNIQUE: Multiplanar, multisequence MRI of the abdomen and pelvis was performed  both before and during bolus administration of intravenous contrast. Negative oral contrast VoLumen was given.  CONTRAST:  40m MULTIHANCE GADOBENATE DIMEGLUMINE 529 MG/ML IV SOLN  COMPARISON:  03/29/2013  FINDINGS: MR ABDOMEN AND PELVIS FINDINGS  No focal abnormality is seen in the liver or spleen. The pancreas is unremarkable. Gallbladder is surgically absent. Adrenal glands are normal. No focal abnormality is seen in either kidney. The hydronephrosis present in the right kidney on the previous CT scan has resolved in the interval. Flow artifact is seen at the UPJ of each kidney on axial T2 weighted imaging.  Imaging after IV contrast administration shows no abnormal enhancement or wall thickening in the stomach or duodenum. Distention of left upper quadrant small bowel is compatible with the enteric contrast administration. No abnormal proximal small bowel enhancement. In the right lower quadrant, there is a loop of ileum which is nondistended and demonstrates increased revealing increased signal intensity on postcontrast imaging. On precontrast imaging, there is no evidence for bowel wall thickening associated with this bowel and there is no mesenteric edema or fluid associated. As such, I think the increased signal intensity on the postcontrast imaging is related to underdistention and apposition of the bowel wall surfaces rather than true pathologic hyper enhancement.  Side-by-side enterocolic anastomosis is identified in this patient status post right hemicolectomy. The anastomosis is patent.  No abdominal aortic aneurysm. No evidence for free fluid or lymphadenopathy in the abdomen  No abnormal marrow signal is identified within the visualized bony structures.  IMPRESSION: No abnormal wall thickening or enhancement identified on today's study. A short segment of distal small bowel which is well distended with thin wall on the pre contrast imaging shows under distension on postcontrast imaging, felt to be  related to peristalsis. There is no edema or fluid within the adjacent mesentery.   Electronically Signed   By: EMisty StanleyM.D.   On: 05/29/2013 10:46      Assessment / Plan:   53year old female with small bowel Crohn's disease maintained on low dose prednisone, Entocort 986mdaily, Imuran 10029maily and Remicade 42m39mkg Q 6 weeks. Admitted with weight loss, nausea and diarrhea. States BMs are actually at baseline for her. Still nauseated and having abdominal pain but MRCP negative for obstruction or active IBD. Her albumin and prealbumin are normal. Doubt need for TNA. Should look to get her off Solumedrol soon with negative MRI.       LOS: 2 days   PaulTye Savoy26/2015, 9:23 AM   [facility] Attending  I have also seen and assessed the patient and agree with the above note. She is about the same - main c/o is upper abdominal pain that is worst pc. Also with globus vs some dysphagia possibly. Not a new problem per se. MR-E is reassuring.  Suspect functional issues predominate over inflammatory.  Change diet to low fiber, add dicyclomine.  DC solumedrol, Zofran (ineffective)  CarlGatha Mayer, FACGRiver Parishes Hospitaltroenterology [phone]) 05/30/2013 1:37 PM

## 2013-05-31 ENCOUNTER — Encounter (HOSPITAL_COMMUNITY): Payer: Self-pay | Admitting: Internal Medicine

## 2013-05-31 DIAGNOSIS — A080 Rotaviral enteritis: Secondary | ICD-10-CM

## 2013-05-31 HISTORY — DX: Rotaviral enteritis: A08.0

## 2013-05-31 LAB — CAROTENE, SERUM: Carotene, Total-Serum: <2 ug/dL — ABNORMAL LOW (ref 6–77)

## 2013-05-31 MED ORDER — PREDNISONE 10 MG PO TABS
10.0000 mg | ORAL_TABLET | Freq: Every day | ORAL | Status: DC
  Administered 2013-05-31 – 2013-06-01 (×2): 10 mg via ORAL
  Filled 2013-05-31 (×4): qty 1

## 2013-05-31 MED ORDER — BUDESONIDE 3 MG PO CP24
9.0000 mg | ORAL_CAPSULE | Freq: Every day | ORAL | Status: DC
  Administered 2013-05-31 – 2013-06-01 (×2): 9 mg via ORAL
  Filled 2013-05-31 (×2): qty 3

## 2013-05-31 NOTE — Progress Notes (Signed)
Patient ID: [name], female   DOB: 09-Jun-1960, 53 y.o.   MRN: [id number] [facility] Progress Note  Subjective:  Feels Ok , Bm's back to baseline- Able to eat, still some nausea- thinks supplements increasing nausea GI pathogen panel is + for rotavirus TSH is low 0.33  Objective:  Vital signs in last 24 hours: Temp:  [97.3 F (36.3 C)-98.5 F (36.9 C)] 98.5 F (36.9 C) (03/27 0639) Pulse Rate:  [58-71] 60 (03/27 0812) Resp:  [16] 16 (03/27 0639) BP: (152-189)/(80-88) 187/80 mmHg (03/27 0812) SpO2:  [97 %-99 %] 97 % (03/27 0739) Last BM Date: 05/31/13 General:   Alert,  Well-developed,  WF  in NAD-holding emesis basin Heart:  Regular rate and rhythm; no murmurs Pulm;clear Abdomen:  Soft, tender rather generally,and nondistended. Normal bowel sounds, without guarding, and without rebound.   Extremities:  Without edema. Neurologic:  Alert and  oriented x4;  grossly normal neurologically. Psych:  Alert and cooperative. Normal mood and affect.  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,immunosupressed- with chronic diarrhea- admitted with worsening sxs ,nausea/vomiting and ongoing weight loss- likely  multifactoral Acute gastroenteritis  Rotavirus  superimposed on chronic sxs Hyperthyroidism  Likely playing a role in weight loss Probable underlying functional issues as well -anx/depression #2 steroid dependence  Home in next  24hours Will get her back on her  Usual regimen Complete a course of Xifaxin She will see her PCP next week in  to get started on thyroid replacement      LOS: 3 days   Amy Esterwood  05/31/2013, 9:32 AM   [facility] Attending  I have also seen and assessed the patient and agree with the above note. [name], MD, [facility] [phone] (pager) 05/31/2013 1:58 PM

## 2013-06-01 DIAGNOSIS — F411 Generalized anxiety disorder: Secondary | ICD-10-CM

## 2013-06-01 MED ORDER — HYDROCODONE-ACETAMINOPHEN 10-325 MG PO TABS: 1.0000 | ORAL_TABLET | ORAL | Status: DC | PRN

## 2013-06-01 MED ORDER — HYDROCODONE-ACETAMINOPHEN 5-325 MG PO TABS: 1.0000 | ORAL_TABLET | Freq: Four times a day (QID) | ORAL | Status: DC | PRN

## 2013-06-01 NOTE — Progress Notes (Signed)
Patient ID: TAIESHA BOVARD, female   DOB: August 07, 1960, 53 y.o.   MRN: [id number] [facility] Progress Note  Subjective: Still taking Dilaudid for pain-same pain she has had for a long time-c/o spasm type pain in epigastrium. Eating fine, 4-5 Bm's per day. Admits that xanax will help her abdominal pain, and she has been stressed-encouraged her to take Xanax at home twice daily for awhile.  Objective:  Vital signs in last 24 hours: Temp:  [98.1 F (36.7 C)-98.4 F (36.9 C)] 98.3 F (36.8 C) (03/28 0718) Pulse Rate:  [61-63] 63 (03/28 0718) Resp:  [18] 18 (03/28 0718) BP: (133-178)/(72-103) 178/103 mmHg (03/28 0718) SpO2:  [96 %-100 %] 100 % (03/28 0718) Last BM Date: 05/31/13 General:   Alert,  Well-developed, WF   in NAD Heart:  Regular rate and rhythm; no murmurs Pulm;clear Abdomen:  Soft, tender and nondistended. Normal bowel sounds, without guarding, and without rebound.   Extremities:  Without edema. Neurologic:  Alert and  oriented x4;  grossly normal neurologically. Psych:  Alert and cooperative. Normal mood and affect.  Intake/Output from previous day: 03/27 0701 - 03/28 0700 In: 6951.7 [P.O.:480; I.V.:6221.7; IV Piggyback:250] Out: -  Intake/Output this shift:      Assessment / Plan: #1 53 yo female with Crohns disease,chronic diarrhea, acute illness with rotavirus #2 Anxiety #3 Hyperthyroidism #4 chronic immunosuppression  Home today, has follow up with dr. Olevia Perches  On 06/11/13 Xanax BID  She will see her PCP next week regarding thyroid     LOS: 4 days   Amy Esterwood  06/01/2013, 9:25 AM   Agree w/ Ms. Esterwood's note. [name], MD, [facility]

## 2013-06-01 NOTE — Discharge Summary (Signed)
Agree w/ Ms. Esterwood's summary

## 2013-06-01 NOTE — Discharge Summary (Signed)
[facility] Discharge Summary  Name: [name] MRN: [id number] DOB: 12-25-1960 53 y.o. PCP:  [name], DO  Date of Admission: 05/28/2013 11:06 AM Date of Discharge: 06/01/2013 Attending Physician: [name], MD  Discharge Diagnosis: #1 Crohns ileitis with chronic diarrhea #2 Rota virus/acute gastroenteritis #3 hyperthyroidism #4 Anxiety #5 no significant malabsorption/malnutrition  Consultations: none Procedures Performed:  Dg Chest 2 View  05/28/2013   CLINICAL DATA:  Weight loss.  Hypertension.  EXAM: CHEST  2 VIEW  COMPARISON:  None.  FINDINGS: Mediastinum and hilar structures are normal. Lungs are clear. Heart size normal. Surgical clips right upper quadrant.  IMPRESSION: No acute cardiopulmonary disease.   Electronically Signed   By: Marcello Moores  Register   On: 05/28/2013 15:50   Mr [name] W/o W/cm  05/29/2013   CLINICAL DATA:  Crohn's disease with nausea and vomiting. Weight loss.  EXAM: MR ABDOMEN AND PELVIS WITHOUT AND WITH CONTRAST (MR ENTEROGRAPHY)  TECHNIQUE: Multiplanar, multisequence MRI of the abdomen and pelvis was performed both before and during bolus administration of intravenous contrast. Negative oral contrast VoLumen was given.  CONTRAST:  61m MULTIHANCE GADOBENATE DIMEGLUMINE 529 MG/ML IV SOLN  COMPARISON:  03/29/2013  FINDINGS: MR ABDOMEN AND PELVIS FINDINGS  No focal abnormality is seen in the liver or spleen. The pancreas is unremarkable. Gallbladder is surgically absent. Adrenal glands are normal. No focal abnormality is seen in either kidney. The hydronephrosis present in the right kidney on the previous CT scan has resolved in the interval. Flow artifact is seen at the UPJ of each kidney on axial T2 weighted imaging.  Imaging after IV contrast administration shows no abnormal enhancement or wall thickening in the stomach or duodenum. Distention of left upper quadrant small bowel is compatible with the enteric contrast administration.  No abnormal proximal small bowel enhancement. In the right lower quadrant, there is a loop of ileum which is nondistended and demonstrates increased revealing increased signal intensity on postcontrast imaging. On precontrast imaging, there is no evidence for bowel wall thickening associated with this bowel and there is no mesenteric edema or fluid associated. As such, I think the increased signal intensity on the postcontrast imaging is related to underdistention and apposition of the bowel wall surfaces rather than true pathologic hyper enhancement.  Side-by-side enterocolic anastomosis is identified in this patient status post right hemicolectomy. The anastomosis is patent.  No abdominal aortic aneurysm. No evidence for free fluid or lymphadenopathy in the abdomen  No abnormal marrow signal is identified within the visualized bony structures.  IMPRESSION: No abnormal wall thickening or enhancement identified on today's study. A short segment of distal small bowel which is well distended with thin wall on the pre contrast imaging shows under distension on postcontrast imaging, felt to be related to peristalsis. There is no edema or fluid within the adjacent mesentery.   Electronically Signed   By: EMisty StanleyM.D.   On: 05/29/2013 10:46   Mr [name] W/o W/cm  05/29/2013   CLINICAL DATA:  Crohn's disease with nausea and vomiting. Weight loss.  EXAM: MR ABDOMEN AND PELVIS WITHOUT AND WITH CONTRAST (MR ENTEROGRAPHY)  TECHNIQUE: Multiplanar, multisequence MRI of the abdomen and pelvis was performed both before and during bolus administration of intravenous contrast. Negative oral contrast VoLumen was given.  CONTRAST:  144mMULTIHANCE GADOBENATE DIMEGLUMINE 529 MG/ML IV SOLN  COMPARISON:  03/29/2013  FINDINGS: MR ABDOMEN AND PELVIS FINDINGS  No focal abnormality is seen in the liver or spleen. The  pancreas is unremarkable. Gallbladder is surgically absent. Adrenal glands are normal. No focal abnormality is  seen in either kidney. The hydronephrosis present in the right kidney on the previous CT scan has resolved in the interval. Flow artifact is seen at the UPJ of each kidney on axial T2 weighted imaging.  Imaging after IV contrast administration shows no abnormal enhancement or wall thickening in the stomach or duodenum. Distention of left upper quadrant small bowel is compatible with the enteric contrast administration. No abnormal proximal small bowel enhancement. In the right lower quadrant, there is a loop of ileum which is nondistended and demonstrates increased revealing increased signal intensity on postcontrast imaging. On precontrast imaging, there is no evidence for bowel wall thickening associated with this bowel and there is no mesenteric edema or fluid associated. As such, I think the increased signal intensity on the postcontrast imaging is related to underdistention and apposition of the bowel wall surfaces rather than true pathologic hyper enhancement.  Side-by-side enterocolic anastomosis is identified in this patient status post right hemicolectomy. The anastomosis is patent.  No abdominal aortic aneurysm. No evidence for free fluid or lymphadenopathy in the abdomen  No abnormal marrow signal is identified within the visualized bony structures.  IMPRESSION: No abnormal wall thickening or enhancement identified on today's study. A short segment of distal small bowel which is well distended with thin wall on the pre contrast imaging shows under distension on postcontrast imaging, felt to be related to peristalsis. There is no edema or fluid within the adjacent mesentery.   Electronically Signed   By: Misty Stanley M.D.   On: 05/29/2013 10:46   Dg [name] W/fl W/rad  05/29/2013   CLINICAL DATA:  Nasogastric tube needed for oral contrast administration for MRI enterography.  EXAM: NASO G TUBE PLACEMENT WITH FL AND WITH RAD  TECHNIQUE: Following preparation of the oropharynx with Cetacaine, a  weighted nasogastric tube was lubricated and placed to the level of the ligament of Treitz under fluoroscopic guidance. Air was introduced into the tube to insurance it position within the bowel of the level of the ligament of Treitz. There were no complications. The tube was taped in place.  CONTRAST:  None.  FLUOROSCOPY TIME:  13 min 9 seconds.  COMPARISON:  None.  FINDINGS: Weighted nasogastric tube successfully placed to the level of the ligament Treitz. No complications. The tube was taped in place.  Successful fluoroscopically directed nasogastric tube placed to the level of ligament of Treitz.   Electronically Signed   By: Marcello Moores  Register   On: 05/29/2013 08:07    GI Procedures: none  History/Physical Exam:  See Admission H&P  Admission HPI: The patient admitted from the office because of concerns with persistent gradual weight loss and potential malabsorption/malnutrition. Patient has long history of Crohn's ileitis and is on chronic immunosuppression with Remicade and Imuran. She has had ongoing issues with abdominal pain and chronic diarrhea. She has had about a 30 pound weight loss over the past year. She presented with worsening abdominal pain and diarrhea as well as intermittent nausea and vomiting is felt to be dehydrated and admitted for supportive management.  Hospital Course by problem list: Patient was admitted to the GI service, she was hydrated, GI pathogen panel was obtained and she was started empirically on IV Solu-Medrol. We also obtained an MR enterography with results as outlined below. There was no evidence for active Crohn's or obstruction. GI pathogen panel returned positive for rotavirus. Patient received  4 days of Xifaxan for potential bacterial overgrowth. Nutritional status was assessed with albumin preop human magnesium phosphorus carotene and surprisingly all of her parameters are normal. Her bowel movements gradually returned to baseline of 5-6 per day, she was able  to tolerate a solid diet for most of her remission though she did complain of nausea and occasionally would spit up though not actually emesis of food. She had some persistent complaints of epigastric and subxiphoid pain which she says she has had for months and is generally relieved with Norco. He also admitted to a significant amount of anxiety and stress are Z. and her personal life. Patient was gradually resumed on her usual regimen and Solu-Medrol was discontinued, she was placed back on Entocort 9 mg by mouth daily and she has been on prednisone 10 mg by mouth daily long-term. She did receive a dose of Remicade while in hospital on 05/29/2013. She is stable and at her baseline and allowed discharged home on 06/01/2013 with plans to followup with Dr. [name] in the office in one week. Medicine regimen same as on admit with addition of Xanax to be used 0.25 twice daily rather than when necessary.    Discharge Vitals:  BP 178/103  Pulse 63  Temp(Src) 98.3 F (36.8 C) (Oral)  Resp 18  Ht 5' 7.72" (1.72 m)  Wt 163 lb 2 oz (73.993 kg)  BMI 25.01 kg/m2  SpO2 100%  Discharge Labs: No results found for this or any previous visit (from the past 24 hour(s)).  Disposition and follow-up:   Ms.Murial F S Rios was discharged from [facility] in stable condition.    Follow-up Appointments:  Future Appointments Provider Department Dept Phone   06/11/2013 9:45 AM Lafayette Dragon, MD [facility] [phone]   07/17/2013 9:00 AM [facility] [place] [phone]      Discharge Medications:   Medication List    STOP taking these medications       diclofenac 75 MG EC tablet  Commonly known as:  VOLTAREN     magic mouthwash Soln      TAKE these medications       ALPRAZolam 0.5 MG tablet  Commonly known as:  XANAX  Take 0.5 mg by mouth 2 (two) times daily as needed for anxiety.     amLODipine 10 MG tablet  Commonly known as:   NORVASC  Take 1 tablet by mouth every morning.     atenolol 25 MG tablet  Commonly known as:  TENORMIN  Take 25 mg by mouth every morning.     azaTHIOprine 50 MG tablet  Commonly known as:  IMURAN  Take 2 tablets (100 mg total) by mouth every morning.     budesonide 3 MG 24 hr capsule  Commonly known as:  ENTOCORT EC  Take 3 capsules (9 mg total) by mouth every morning.     budesonide-formoterol 80-4.5 MCG/ACT inhaler  Commonly known as:  SYMBICORT  Inhale 2 puffs into the lungs 2 (two) times daily as needed (wheezing).     cyclobenzaprine 10 MG tablet  Commonly known as:  FLEXERIL  Take 1 tablet (10 mg total) by mouth at bedtime as needed for muscle spasms.     dicyclomine 20 MG tablet  Commonly known as:  BENTYL  Take 1 tablet (20 mg total) by mouth 3 (three) times daily.     diphenoxylate-atropine 2.5-0.025 MG per tablet  Commonly known as:  LOMOTIL  Take  1 tablet by mouth 4 (four) times daily as needed for diarrhea or loose stools.     eszopiclone 2 MG Tabs tablet  Commonly known as:  LUNESTA  Take 2 mg by mouth at bedtime as needed. Take immediately before bedtime     fenofibrate 160 MG tablet  Take 160 mg by mouth every morning.     fluconazole 100 MG tablet  Commonly known as:  DIFLUCAN  Take 100 mg by mouth once a week. On mondays     HYDROcodone-acetaminophen 10-325 MG per tablet  Commonly known as:  NORCO  Take 1 tablet by mouth every 4 (four) hours as needed.     hydrocortisone 25 MG suppository  Commonly known as:  ANUSOL-HC  Insert 1 suppository into rectum twice daily x 1 week, then insert 1 suppository per rectum once every night thereafter until gone.     hydrOXYzine 25 MG tablet  Commonly known as:  ATARAX/VISTARIL  Take 25-50 mg by mouth every 6 (six) hours as needed for itching (And rash).     loperamide 2 MG capsule  Commonly known as:  IMODIUM  Take 2 capsules (4 mg total) by mouth 2 (two) times daily.     omeprazole 20 MG capsule    Commonly known as:  PRILOSEC  Take 1 capsule (20 mg total) by mouth 2 (two) times daily before a meal.     potassium chloride SA 20 MEQ tablet  Commonly known as:  K-DUR,KLOR-CON  Take 20 mEq by mouth daily.     pramoxine-hydrocortisone cream  Commonly known as:  ANALPRAM HC  Apply 1 application topically 4 (four) times daily.     predniSONE 5 MG tablet  Commonly known as:  DELTASONE  Take 10 mg by mouth daily with breakfast.     PROAIR HFA 108 (90 BASE) MCG/ACT inhaler  Generic drug:  albuterol  Inhale 2 puffs into the lungs every 4 (four) hours as needed for wheezing or shortness of breath.     promethazine 25 MG tablet  Commonly known as:  PHENERGAN  Take 25 mg by mouth every 6 (six) hours as needed for nausea or vomiting.     promethazine 25 MG suppository  Commonly known as:  PHENERGAN  Place 25 mg rectally every 6 (six) hours as needed for nausea or vomiting.     REMICADE 100 MG injection  Generic drug:  inFLIXimab  Inject 10 mg/kg into the vein every 6 (six) weeks.     VITAMIN B-12 IJ  Inject as directed every 30 (thirty) days.        Signed: Nicoletta Ba 06/01/2013, 9:51 AM

## 2013-06-01 NOTE — Progress Notes (Signed)
Patient discharge instructions/medications reviewed with patient with opportunity to ask questions. Patient verbalizes understanding of all information received and has no questions at this time. Patient discharged home.

## 2013-06-06 ENCOUNTER — Encounter (HOSPITAL_COMMUNITY)
Admission: RE | Admit: 2013-06-06 | Payer: 59 | Source: Ambulatory Visit | Attending: Internal Medicine | Admitting: Internal Medicine

## 2013-06-11 ENCOUNTER — Encounter: Payer: Self-pay | Admitting: Internal Medicine

## 2013-06-11 ENCOUNTER — Ambulatory Visit (INDEPENDENT_AMBULATORY_CARE_PROVIDER_SITE_OTHER): Payer: 59 | Admitting: Internal Medicine

## 2013-06-11 VITALS — BP 110/70 | HR 84 | Ht 67.72 in | Wt 173.1 lb

## 2013-06-11 DIAGNOSIS — K219 Gastro-esophageal reflux disease without esophagitis: Secondary | ICD-10-CM

## 2013-06-11 DIAGNOSIS — K5289 Other specified noninfective gastroenteritis and colitis: Secondary | ICD-10-CM

## 2013-06-11 MED ORDER — OMEPRAZOLE 40 MG PO CPDR: 40.0000 mg | DELAYED_RELEASE_CAPSULE | Freq: Two times a day (BID) | ORAL | Status: DC

## 2013-06-11 NOTE — Patient Instructions (Signed)
We have sent the following medications to your pharmacy for you to pick up at your convenience: Prilosec 40 mg twice daily (in place of 20 mg twice daily)  Please discontinue Bentyl (dicyclomine).  Please follow up with Dr Olevia Perches in 6 weeks.  CC:Dr [employer]

## 2013-06-11 NOTE — Progress Notes (Signed)
[name] [birth year] [id number]  Note: This dictation was prepared with Dragon digital system. Any transcriptional errors that result from this procedure are unintentional.   History of Present Illness:  This is a 53 year old white female who is post hospitalization for increased diarrhea and dehydration. She has Crohn's disease of the ileum and colon. Stool pathogens showed rotavirus. She was discharged after 48-hours of hydration. She was discharged on prednisone 10 mg daily and budesonide 3 mg twice a day as well as Imuran 100 mg a day. She received her Remicade infusion of 10 mg/kg. Her next infusion will be due in 6 weeks. She is feeling much better. She has gained 5 pounds but still has nausea and diarrhea. Her symptoms are at her baseline.    Past Medical History  Diagnosis Date  . Crohn's disease   . Depression   . GERD (gastroesophageal reflux disease)   . Vitamin B12 deficiency   . Psoriasis     SKIN  . Family history of malignant neoplasm of gastrointestinal tract   . Crohn's colitis   . PONV (postoperative nausea and vomiting)   . Hypertension   . H/O hiatal hernia   . Seasonal asthma   . S/P dilatation of esophageal stricture   . History of adenomatous polyp of colon   . History of small bowel obstruction     SECONDARY CROHN'S STRICTURE  S/P COLECTOMY  . History of gastritis     AND HX ILEITIS  . Chronic diarrhea   . Internal hemorrhoids   . History of kidney stones   . Right ureteral stone   . Hypopotassemia   . Chronic disease anemia   . Urgency of urination   . Arthritis   . Hyperlipidemia   . Rotavirus infection 05/31/2013    Past Surgical History  Procedure Laterality Date  . Carpal tunnel release Right 2000  . Anal fissure repair  1990's  . Cystoscopy with retrograde pyelogram, ureteroscopy and stent placement Left 03/08/2013    Procedure: CYSTOSCOPY WITH RETROGRADE PYELOGRAM, URETEROSCOPY AND STENT PLACEMENT stone basketing;  Surgeon:  Alexis Frock, MD;  Location: [facility];  Service: Urology;  Laterality: Left;  . Vaginal hysterectomy  1992  . Cholecystectomy  2002  . Laparoscopic ileocecectomy  10-09-2008    AND APPENDECTOMY (TERMINAL ILEITIS & PARTIAL SBO)  . Dx laparoscopy converted to open right colecctomy  09-15-2010    ANASTOMOTIC STRICTURE  . Cystoscopy with retrograde pyelogram, ureteroscopy and stent placement Right 04/03/2013    Procedure: [place], URETEROSCOPY AND STENT PLACEMENT;  Surgeon: Alexis Frock, MD;  Location: [facility];  Service: Urology;  Laterality: Right;    Allergies  Allergen Reactions  . Codeine Hives and Nausea And Vomiting  . Oxycodone Nausea And Vomiting    SEVERE N/V AND CRAMPING    Family history and social history have been reviewed.  Review of Systems: Denies dysphagia. Weight gain of 5 pounds  The remainder of the 10 point ROS is negative except as outlined in the H&P  Physical Exam: General Appearance chronically ill-appearing, in no distress Eyes  Non icteric  HEENT  Non traumatic, normocephalic  Mouth No lesion, tongue papillated, positive for cheilosis Neck Supple without adenopathy, thyroid not enlarged, no carotid bruits, no JVD Lungs Clear to auscultation bilaterally COR Normal S1, normal S2, regular rhythm, no murmur, quiet precordium Abdomen soft with quiet bowel sounds. Diffusely tender but no rebound or masses. Well-healed surgical scar Rectal not repeated Extremities  No pedal edema Skin No lesions Neurological Alert and oriented x 3 Psychological Normal mood and affect  Assessment and Plan:   Problem #1 Post hospitalization for rotavirus gastroenteritis superimposed on Crohn's ileocolitis. She is immunosuppressed with Remicade, Imuran, budesonide and prednisone. She has been able to gain back 5 pounds and is able to keep food down.  Problem #2 Gastroesophageal reflux. Patient's last endoscopy in November 2014  showed a mild stricture. We will increase Prilosec to 40 mg twice a day. She is complaining of early satiety and gastroparesis. She vomited her evening meal x 2 days in the morning. This could be caused by Bentyl. We will hold Bentyl for now.  I will see her in 6 weeks.    [name] 06/11/2013

## 2013-07-10 ENCOUNTER — Encounter (HOSPITAL_COMMUNITY): Payer: 59

## 2013-07-15 ENCOUNTER — Telehealth: Payer: Self-pay | Admitting: *Deleted

## 2013-07-15 ENCOUNTER — Other Ambulatory Visit: Payer: Self-pay | Admitting: *Deleted

## 2013-07-15 DIAGNOSIS — K5090 Crohn's disease, unspecified, without complications: Secondary | ICD-10-CM

## 2013-07-15 NOTE — Telephone Encounter (Signed)
Message copied by Hulan Saas on Mon Jul 15, 2013  2:33 PM ------      Message from: Lafayette Dragon      Created: Mon Jul 15, 2013 11:57 AM       Yes, the same dose  remicade 54m/kg. thanx      ----- Message -----         From: RHulan Saas RN         Sent: 07/15/2013   9:55 AM           To: DLafayette Dragon MD            Dr. BOlevia Perches      LNatasjais due for Remicade tomorrow. I just want to be sure her dose is still 132mkg.(looks like she got her last dose as an inpatient).      Susen Haskew       ------

## 2013-07-17 ENCOUNTER — Encounter (HOSPITAL_COMMUNITY): Payer: Self-pay

## 2013-07-17 ENCOUNTER — Encounter (HOSPITAL_COMMUNITY)
Admission: RE | Admit: 2013-07-17 | Discharge: 2013-07-17 | Disposition: A | Discharge status: Home / Self Care | Payer: 59 | Source: Ambulatory Visit | Attending: Internal Medicine | Admitting: Internal Medicine

## 2013-07-17 ENCOUNTER — Encounter (HOSPITAL_COMMUNITY): Payer: 59

## 2013-07-17 VITALS — BP 124/81 | HR 75 | Temp 97.1°F | Resp 18 | Ht 68.0 in | Wt 172.2 lb

## 2013-07-17 DIAGNOSIS — K5090 Crohn's disease, unspecified, without complications: Secondary | ICD-10-CM | POA: Insufficient documentation

## 2013-07-17 MED ORDER — SODIUM CHLORIDE 0.9 % IV SOLN
10.0000 mg/kg | INTRAVENOUS | Status: DC
  Administered 2013-07-17: 800 mg via INTRAVENOUS
  Filled 2013-07-17: qty 80

## 2013-07-17 MED ORDER — ACETAMINOPHEN 325 MG PO TABS
650.0000 mg | ORAL_TABLET | ORAL | Status: DC
  Administered 2013-07-17: 650 mg via ORAL
  Filled 2013-07-17: qty 2

## 2013-07-17 MED ORDER — SODIUM CHLORIDE 0.9 % IV SOLN
INTRAVENOUS | Status: DC
  Administered 2013-07-17: 09:00:00 via INTRAVENOUS

## 2013-07-17 MED ORDER — DIPHENHYDRAMINE HCL 25 MG PO CAPS
25.0000 mg | ORAL_CAPSULE | ORAL | Status: DC
  Administered 2013-07-17: 25 mg via ORAL
  Filled 2013-07-17: qty 1

## 2013-07-23 ENCOUNTER — Other Ambulatory Visit (INDEPENDENT_AMBULATORY_CARE_PROVIDER_SITE_OTHER): Payer: PRIVATE HEALTH INSURANCE

## 2013-07-23 ENCOUNTER — Encounter: Payer: Self-pay | Admitting: Internal Medicine

## 2013-07-23 ENCOUNTER — Ambulatory Visit (INDEPENDENT_AMBULATORY_CARE_PROVIDER_SITE_OTHER): Payer: PRIVATE HEALTH INSURANCE | Admitting: Internal Medicine

## 2013-07-23 VITALS — BP 124/64 | Ht 67.75 in | Wt 174.4 lb

## 2013-07-23 DIAGNOSIS — K5090 Crohn's disease, unspecified, without complications: Secondary | ICD-10-CM | POA: Diagnosis not present

## 2013-07-23 DIAGNOSIS — R112 Nausea with vomiting, unspecified: Secondary | ICD-10-CM | POA: Diagnosis not present

## 2013-07-23 DIAGNOSIS — Z79899 Other long term (current) drug therapy: Secondary | ICD-10-CM

## 2013-07-23 DIAGNOSIS — K5289 Other specified noninfective gastroenteritis and colitis: Secondary | ICD-10-CM | POA: Diagnosis not present

## 2013-07-23 DIAGNOSIS — D849 Immunodeficiency, unspecified: Secondary | ICD-10-CM

## 2013-07-23 LAB — BASIC METABOLIC PANEL
BUN: 11 mg/dL (ref 6–23)
CHLORIDE: 105 meq/L (ref 96–112)
CO2: 29 meq/L (ref 19–32)
CREATININE: 0.7 mg/dL (ref 0.4–1.2)
Calcium: 9.4 mg/dL (ref 8.4–10.5)
GFR: 86.91 mL/min (ref >60.00)
Glucose, Bld: 89 mg/dL (ref 70–99)
POTASSIUM: 3.5 meq/L (ref 3.5–5.1)
Sodium: 139 mEq/L (ref 135–145)

## 2013-07-23 MED ORDER — PRAMOXINE-HC 1-1 % EX CREA: 1.0000 "application " | TOPICAL_CREAM | Freq: Four times a day (QID) | CUTANEOUS | Status: DC

## 2013-07-23 MED ORDER — METRONIDAZOLE 250 MG PO TABS
250.0000 mg | ORAL_TABLET | Freq: Three times a day (TID) | ORAL | Status: DC
Start: 2013-07-23 — End: 2014-02-18

## 2013-07-23 MED ORDER — OMEPRAZOLE 40 MG PO CPDR: 40.0000 mg | DELAYED_RELEASE_CAPSULE | Freq: Two times a day (BID) | ORAL | Status: DC

## 2013-07-23 MED ORDER — BUDESONIDE 3 MG PO CP24: 9.0000 mg | ORAL_CAPSULE | ORAL | Status: DC

## 2013-07-23 MED ORDER — HYDROCODONE-ACETAMINOPHEN 10-325 MG PO TABS: 1.0000 | ORAL_TABLET | ORAL | Status: DC | PRN

## 2013-07-23 MED ORDER — DICYCLOMINE HCL 20 MG PO TABS: 20.0000 mg | ORAL_TABLET | Freq: Three times a day (TID) | ORAL | Status: DC

## 2013-07-23 MED ORDER — PREDNISONE 5 MG PO TABS: 10.0000 mg | ORAL_TABLET | Freq: Every day | ORAL | Status: DC

## 2013-07-23 MED ORDER — AZATHIOPRINE 50 MG PO TABS: 100.0000 mg | ORAL_TABLET | ORAL | Status: DC

## 2013-07-23 MED ORDER — LOPERAMIDE HCL 2 MG PO CAPS: 4.0000 mg | ORAL_CAPSULE | Freq: Two times a day (BID) | ORAL | Status: DC

## 2013-07-23 MED ORDER — DIPHENOXYLATE-ATROPINE 2.5-0.025 MG PO TABS: 1.0000 | ORAL_TABLET | Freq: Four times a day (QID) | ORAL | Status: DC | PRN

## 2013-07-23 NOTE — Progress Notes (Signed)
[name] [birth year] [id number]  Note: This dictation was prepared with Dragon digital system. Any transcriptional errors that result from this procedure are unintentional.   History of Present Illness:  This is a 53 year old white female with complicated Crohn's disease of the small bowel, chronic diarrhea, chronic abdominal pain, nausea and vomiting. She has had recent weight loss. Patient also had a recent hospitalization for dehydration due to rotavirus gastroenteritis. She is currently on maximum medical therapy which includes Imuran 100 mg daily,  Remicade 10 mg/kg every 6 weeks, budesonide 6 mg daily and prednisone 10 mg daily. We discontinued Bentyl last time because of concern for dysphagia and dry mouth, but she wants it back because of crampy abdominal pain and loose stool. She is wondering if she should go back on Humira since the Remicade doesn't seem to be working. Her psoriasis is coming back and it was under complete control when she was on Humira. There has been no fever or rectal bleeding.Her Remicade antibodies were negative.    Past Medical History  Diagnosis Date  . Crohn's disease   . Depression   . GERD (gastroesophageal reflux disease)   . Vitamin B12 deficiency   . Psoriasis     SKIN  . Family history of malignant neoplasm of gastrointestinal tract   . Crohn's colitis   . PONV (postoperative nausea and vomiting)   . Hypertension   . H/O hiatal hernia   . Seasonal asthma   . S/P dilatation of esophageal stricture   . History of adenomatous polyp of colon   . History of small bowel obstruction     SECONDARY CROHN'S STRICTURE  S/P COLECTOMY  . History of gastritis     AND HX ILEITIS  . Chronic diarrhea   . Internal hemorrhoids   . History of kidney stones   . Right ureteral stone   . Hypopotassemia   . Chronic disease anemia   . Urgency of urination   . Arthritis   . Hyperlipidemia   . Rotavirus infection 05/31/2013    Past Surgical History   Procedure Laterality Date  . Carpal tunnel release Right 2000  . Anal fissure repair  1990's  . Cystoscopy with retrograde pyelogram, ureteroscopy and stent placement Left 03/08/2013    Procedure: CYSTOSCOPY WITH RETROGRADE PYELOGRAM, URETEROSCOPY AND STENT PLACEMENT stone basketing;  Surgeon: Alexis Frock, MD;  Location: [facility];  Service: Urology;  Laterality: Left;  . Vaginal hysterectomy  1992  . Cholecystectomy  2002  . Laparoscopic ileocecectomy  10-09-2008    AND APPENDECTOMY (TERMINAL ILEITIS & PARTIAL SBO)  . Dx laparoscopy converted to open right colecctomy  09-15-2010    ANASTOMOTIC STRICTURE  . Cystoscopy with retrograde pyelogram, ureteroscopy and stent placement Right 04/03/2013    Procedure: [place], URETEROSCOPY AND STENT PLACEMENT;  Surgeon: Alexis Frock, MD;  Location: [facility];  Service: Urology;  Laterality: Right;    Allergies  Allergen Reactions  . Codeine Hives and Nausea And Vomiting  . Oxycodone Nausea And Vomiting    SEVERE N/V AND CRAMPING    Family history and social history have been reviewed.  Review of Systems: Occasional dysphagia. Denies aphthous ulcers in the mouth.  The remainder of the 10 point ROS is negative except as outlined in the H&P  Physical Exam: General Appearance Well developed, in no distress chronically ill-appearing Eyes  Non icteric  HEENT  Non traumatic, normocephalic  Mouth No lesion, tongue papillated,  cheilosispresent Neck Supple without  adenopathy, thyroid not enlarged, no carotid bruits, no JVD Lungs Clear to auscultation bilaterally COR Normal S1, normal S2, regular rhythm, no murmur, quiet precordium Abdomen soft with normal active bowel sounds. No distention. Very tender across the upper abdomen. No rebound. No palpable mass or ascites Rectal not done Extremities  No pedal edema Skin small psoriatic lesions on the lower extremities Neurological Alert and oriented x  3 Psychological Normal mood and affect  Assessment and Plan:   Problem #1 Complicated Crohn's disease of the small bowel. Patient is status post two terminal ileum resections.2 years apart. Remicade does not seem to be working. We will switch her back to Humira with induction and maintenance dose of 40 mg every 2 weeks. She will remain on all other medications. She will increase budesonide to 3 mg 3 tablets a day. We will add Flagyl 250 mg 3 times a day for 7 days for bacteria overgrowth. She will also resume Bentyl 20 mg 3 times a day. We will check her zinc levels, carotene, vitamin A, and BMET. She needs a refill on several medications including  Norco. She will stay on the same dosage of prednisone. I will see her in 8 weeks. Consider push enteroscopy to reach distal ileum at [place] 07/23/2013

## 2013-07-23 NOTE — Patient Instructions (Signed)
We have sent the following medications to your pharmacy for you to pick up at your convenience: Bentyl Flagyl Imuran Lomotil Prednisone Hydrocodone Imodium Prilosec Analpram Entocort  Your physician has requested that you go to the basement for the following lab work before leaving today: BMET, Zinc, Vitamin A level, Serum carotene  We will work on getting you re-established on Humira.  CC:Dr [employer]

## 2013-07-25 ENCOUNTER — Other Ambulatory Visit: Payer: Self-pay | Admitting: Internal Medicine

## 2013-07-25 LAB — CAROTENE, SERUM: Carotene, Total-Serum: 2 ug/dL — ABNORMAL LOW (ref 6–77)

## 2013-07-25 LAB — VITAMIN A: VITAMIN A (RETINOIC ACID): 36 ug/dL — AB (ref 38–98)

## 2013-07-25 LAB — ZINC: Zinc: 111 ug/dL (ref 60–130)

## 2013-07-26 ENCOUNTER — Other Ambulatory Visit: Payer: Self-pay | Admitting: *Deleted

## 2013-07-26 DIAGNOSIS — E509 Vitamin A deficiency, unspecified: Secondary | ICD-10-CM

## 2013-07-26 MED ORDER — AMBULATORY NON FORMULARY MEDICATION: 10000.0000 [IU] | Freq: Every day | Status: DC

## 2013-08-07 ENCOUNTER — Telehealth: Payer: Self-pay | Admitting: Internal Medicine

## 2013-08-07 NOTE — Telephone Encounter (Signed)
Okayed script to be filled with [facility].

## 2013-08-14 ENCOUNTER — Telehealth: Payer: Self-pay | Admitting: Internal Medicine

## 2013-08-14 NOTE — Telephone Encounter (Signed)
Pt wants to know when she needs to start her Humira injections, she should receive them Friday. Pt also wants to know if it is ok for her to give herself the injections, states she has done humira and embrel before. Let pt know Dr. Nichola Sizer nurse will call her tomorrow.

## 2013-08-15 NOTE — Telephone Encounter (Signed)
Left a message for patient to call me. 

## 2013-08-15 NOTE — Telephone Encounter (Signed)
Patient instructed to cal [place] as soon as she receives the medication. She will do her own injections after we tell her when to start them.

## 2013-08-16 ENCOUNTER — Telehealth: Payer: Self-pay | Admitting: Internal Medicine

## 2013-08-16 NOTE — Telephone Encounter (Signed)
Spoke with Dr. Olevia Perches and patient should start Humira on 08/29/13. Patient notified.

## 2013-08-22 ENCOUNTER — Telehealth: Payer: Self-pay | Admitting: *Deleted

## 2013-08-22 NOTE — Telephone Encounter (Signed)
Message copied by Hulan Saas on Thu Aug 22, 2013  8:18 AM ------      Message from: Hulan Saas      Created: Fri Jul 26, 2013  8:34 AM       Call and remind patient due for Vit A level on 08/26/13 DB ------

## 2013-08-22 NOTE — Telephone Encounter (Signed)
Left a message for patient to call back. 

## 2013-08-23 NOTE — Telephone Encounter (Signed)
Patient notified and she will come for labs.

## 2013-08-26 ENCOUNTER — Other Ambulatory Visit: Payer: Self-pay | Admitting: Internal Medicine

## 2013-08-28 ENCOUNTER — Other Ambulatory Visit: Payer: Medicare Other

## 2013-08-28 DIAGNOSIS — E509 Vitamin A deficiency, unspecified: Secondary | ICD-10-CM

## 2013-08-29 ENCOUNTER — Encounter (HOSPITAL_COMMUNITY): Payer: 59

## 2013-09-01 LAB — VITAMIN A: Vitamin A (Retinoic Acid): 58 ug/dL (ref 38–98)

## 2013-09-03 ENCOUNTER — Other Ambulatory Visit: Payer: Self-pay | Admitting: *Deleted

## 2013-09-03 MED ORDER — ADALIMUMAB 40 MG/0.8ML ~~LOC~~ AJKT: 1.0000 "pen " | AUTO-INJECTOR | SUBCUTANEOUS | Status: DC

## 2013-09-17 ENCOUNTER — Other Ambulatory Visit (INDEPENDENT_AMBULATORY_CARE_PROVIDER_SITE_OTHER): Payer: PRIVATE HEALTH INSURANCE

## 2013-09-17 ENCOUNTER — Encounter: Payer: Self-pay | Admitting: Internal Medicine

## 2013-09-17 ENCOUNTER — Ambulatory Visit (INDEPENDENT_AMBULATORY_CARE_PROVIDER_SITE_OTHER): Payer: PRIVATE HEALTH INSURANCE | Admitting: Internal Medicine

## 2013-09-17 VITALS — BP 118/86 | HR 100 | Ht 67.72 in | Wt 175.2 lb

## 2013-09-17 DIAGNOSIS — R197 Diarrhea, unspecified: Secondary | ICD-10-CM

## 2013-09-17 DIAGNOSIS — K5000 Crohn's disease of small intestine without complications: Secondary | ICD-10-CM | POA: Diagnosis not present

## 2013-09-17 DIAGNOSIS — D849 Immunodeficiency, unspecified: Secondary | ICD-10-CM

## 2013-09-17 DIAGNOSIS — Z79899 Other long term (current) drug therapy: Secondary | ICD-10-CM | POA: Diagnosis not present

## 2013-09-17 DIAGNOSIS — K50018 Crohn's disease of small intestine with other complication: Secondary | ICD-10-CM

## 2013-09-17 LAB — T3, FREE: T3 FREE: 2.8 pg/mL (ref 2.3–4.2)

## 2013-09-17 LAB — TSH: TSH: 0.8 u[IU]/mL (ref 0.35–4.50)

## 2013-09-17 LAB — T4, FREE: Free T4: 0.83 ng/dL (ref 0.60–1.60)

## 2013-09-17 MED ORDER — PROMETHAZINE HCL 25 MG PO TABS: 25.0000 mg | ORAL_TABLET | Freq: Four times a day (QID) | ORAL | Status: DC | PRN

## 2013-09-17 MED ORDER — MEDIUM CHAIN TRIGLYCERIDES PO OIL: 15.0000 mL | TOPICAL_OIL | Freq: Three times a day (TID) | ORAL | Status: DC

## 2013-09-17 NOTE — Patient Instructions (Addendum)
You have been scheduled for an appointment with Dr Renato Shin on 09/25/13 at 10:15 am. Their address is: [address] [place], [place]. Phone: [phone].  Your physician has requested that you go to the basement for the following lab work before leaving today: Free T3, Free T4, TSH  We have sent the following medications to your pharmacy for you to pick up at your convenience: MCT 1 teaspoon three times daily Phenergan  CC:Dr [employer]

## 2013-09-17 NOTE — Progress Notes (Signed)
[name] December 04, 1960 [id number]  Note: This dictation was prepared with Dragon digital system. Any transcriptional errors that result from this procedure are unintentional.   History of Present Illness:  This is a 53 year old white female with complicated Crohn's disease of the small bowel, chronic diarrhea and chronic abdominal pain, nausea and vomiting. She had a gradual weight loss of 30 pounds but has stabilized recently. She has been switched from Remicade to Humira and is feeling much better. Her last visit was on 07/23/2013. She is currently on budesonide 9 mg daily, Imuran 100 mg daily and Humira 40 mg every 2 weeks. She is feeling much better today. She was hospitalized with rotavirus 2 months ago. Her TSH has been low at 0.3. She has a low carotene of less than 2 indicating malabsorption but her vitamin K level is normal and zinc level is normal. Small bowel biopsies in November 2014 showed a normal small bowel villous pattern. Her last colonoscopy in November 2012 showed an open ileocolic anastomosis and multiple polyps with low-grade dysplasia. She will be due for a recall colonoscopy in November 2015.    Past Medical History  Diagnosis Date  . Crohn's disease   . Depression   . GERD (gastroesophageal reflux disease)   . Vitamin B12 deficiency   . Psoriasis     SKIN  . Family history of malignant neoplasm of gastrointestinal tract   . Crohn's colitis   . PONV (postoperative nausea and vomiting)   . Hypertension   . H/O hiatal hernia   . Seasonal asthma   . S/P dilatation of esophageal stricture   . History of adenomatous polyp of colon   . History of small bowel obstruction     SECONDARY CROHN'S STRICTURE  S/P COLECTOMY  . History of gastritis     AND HX ILEITIS  . Chronic diarrhea   . Internal hemorrhoids   . History of kidney stones   . Right ureteral stone   . Hypopotassemia   . Chronic disease anemia   . Urgency of urination   . Arthritis   .  Hyperlipidemia   . Rotavirus infection 05/31/2013    Past Surgical History  Procedure Laterality Date  . Carpal tunnel release Right 2000  . Anal fissure repair  1990's  . Cystoscopy with retrograde pyelogram, ureteroscopy and stent placement Left 03/08/2013    Procedure: CYSTOSCOPY WITH RETROGRADE PYELOGRAM, URETEROSCOPY AND STENT PLACEMENT stone basketing;  Surgeon: Alexis Frock, MD;  Location: [facility];  Service: Urology;  Laterality: Left;  . Vaginal hysterectomy  1992  . Cholecystectomy  2002  . Laparoscopic ileocecectomy  10-09-2008    AND APPENDECTOMY (TERMINAL ILEITIS & PARTIAL SBO)  . Dx laparoscopy converted to open right colecctomy  09-15-2010    ANASTOMOTIC STRICTURE  . Cystoscopy with retrograde pyelogram, ureteroscopy and stent placement Right 04/03/2013    Procedure: [place], URETEROSCOPY AND STENT PLACEMENT;  Surgeon: Alexis Frock, MD;  Location: [facility];  Service: Urology;  Laterality: Right;    Allergies  Allergen Reactions  . Codeine Hives and Nausea And Vomiting  . Oxycodone Nausea And Vomiting    SEVERE N/V AND CRAMPING    Family history and social history have been reviewed.  Review of Systems: cheilosis, diarrhea. Denies rectal bleeding  The remainder of the 10 point ROS is negative except as outlined in the H&P  Physical Exam: General Appearance Well developed, in no distress, topically ill-appearing Eyes  Non icteric  HEENT  Non traumatic, normocephalic  Mouth No lesion, tongue papillated,  Cheilosis  Neck Supple without adenopathy, thyroid not enlarged, no carotid bruits, no JVD Lungs Clear to auscultation bilaterally COR Normal S1, normal S2, regular rhythm, no murmur, quiet precordium Abdomen normal active bowel sounds diffusely tender more so left of the umbilicus Rectal not done  Extremities  No pedal edema Skin No lesions, small psoriatic lesions on the lax R. clearing Neurological Alert and  oriented x 3 Psychological Normal mood and affect  Assessment and Plan:   Problem #59 53 year old white female with crohn's disease of the small bowel who is improved  on maximum medical therapy since she has started Humira. She will decrease  Budesonide to 6 mg daily. We will also refer her to Dr. Loanne Drilling to make sure she is not hyperthyroid which would be contributing to her diarrhea. She has malabsorption due toCrohn's disease of the small bowl.. We will start her on medium-chain triglycerides, 15 cc 3 times a day on a trial basis. She will continue all her other medications. I will see her in 3 months and we will check her T3, T4 and TSH today.    [name] 09/17/2013

## 2013-09-17 NOTE — Addendum Note (Signed)
Addended by: Larina Bras on: 09/17/2013 04:57 PM   Modules accepted: Orders

## 2013-09-20 ENCOUNTER — Telehealth: Payer: Self-pay | Admitting: Internal Medicine

## 2013-09-20 NOTE — Telephone Encounter (Signed)
Dr Olevia Perches, please advise.Marland KitchenMarland Kitchen

## 2013-09-20 NOTE — Telephone Encounter (Signed)
I am not aware of a substitute for MCT's. May be she can take only 1 tablespoon/day and pay for 1/2 of the prescription.

## 2013-09-23 NOTE — Telephone Encounter (Signed)
I have advised patient of Dr Nichola Sizer recommendation and she verbalizes understanding.

## 2013-09-25 ENCOUNTER — Encounter: Payer: Self-pay | Admitting: Endocrinology

## 2013-09-25 ENCOUNTER — Ambulatory Visit (INDEPENDENT_AMBULATORY_CARE_PROVIDER_SITE_OTHER): Payer: PRIVATE HEALTH INSURANCE | Admitting: Endocrinology

## 2013-09-25 VITALS — BP 136/90 | HR 75 | Temp 98.5°F | Ht 67.75 in | Wt 178.0 lb

## 2013-09-25 DIAGNOSIS — R7989 Other specified abnormal findings of blood chemistry: Secondary | ICD-10-CM

## 2013-09-25 DIAGNOSIS — R946 Abnormal results of thyroid function studies: Secondary | ICD-10-CM

## 2013-09-25 NOTE — Progress Notes (Signed)
Subjective:    Patient ID: [name], female    DOB: 09-07-1960, 53 y.o.   MRN: [id number]  HPI Pt reports he was dx'ed with hyperthyroidism in 2010.  she has never been on therapy for this.  she has never had XRT to the anterior neck, or thyroid surgery.  she has never had thyroid imaging.  she does not consume kelp or any other prescribed or non-prescribed thyroid medication.  she has never been on amiodarone.  She has slight tremor of the hands, and assoc weight loss Past Medical History  Diagnosis Date  . Crohn's disease   . Depression   . GERD (gastroesophageal reflux disease)   . Vitamin B12 deficiency   . Psoriasis     SKIN  . Family history of malignant neoplasm of gastrointestinal tract   . Crohn's colitis   . PONV (postoperative nausea and vomiting)   . Hypertension   . H/O hiatal hernia   . Seasonal asthma   . S/P dilatation of esophageal stricture   . History of adenomatous polyp of colon   . History of small bowel obstruction     SECONDARY CROHN'S STRICTURE  S/P COLECTOMY  . History of gastritis     AND HX ILEITIS  . Chronic diarrhea   . Internal hemorrhoids   . History of kidney stones   . Right ureteral stone   . Hypopotassemia   . Chronic disease anemia   . Urgency of urination   . Arthritis   . Hyperlipidemia   . Rotavirus infection 05/31/2013    Past Surgical History  Procedure Laterality Date  . Carpal tunnel release Right 2000  . Anal fissure repair  1990's  . Cystoscopy with retrograde pyelogram, ureteroscopy and stent placement Left 03/08/2013    Procedure: CYSTOSCOPY WITH RETROGRADE PYELOGRAM, URETEROSCOPY AND STENT PLACEMENT stone basketing;  Surgeon: Alexis Frock, MD;  Location: [facility];  Service: Urology;  Laterality: Left;  . Vaginal hysterectomy  1992  . Cholecystectomy  2002  . Laparoscopic ileocecectomy  10-09-2008    AND APPENDECTOMY (TERMINAL ILEITIS & PARTIAL SBO)  . Dx laparoscopy converted to open right colecctomy   09-15-2010    ANASTOMOTIC STRICTURE  . Cystoscopy with retrograde pyelogram, ureteroscopy and stent placement Right 04/03/2013    Procedure: [place], URETEROSCOPY AND STENT PLACEMENT;  Surgeon: Alexis Frock, MD;  Location: [facility];  Service: Urology;  Laterality: Right;    History   Social History  . Marital Status: Married    Spouse Name: N/A    Number of Children: 2  . Years of Education: N/A   Occupational History  . cook    Social History Main Topics  . Smoking status: Former Smoker -- 1.00 packs/day for 10 years    Types: Cigarettes    Quit date: 03/07/1984  . Smokeless tobacco: Never Used  . Alcohol Use: No  . Drug Use: No  . Sexual Activity: Not on file   Other Topics Concern  . Not on file   Social History Narrative  . No narrative on file    Current Outpatient Prescriptions on File Prior to Visit  Medication Sig Dispense Refill  . Adalimumab (HUMIRA PEN) 40 MG/0.8ML PNKT Inject 1 pen into the skin every 14 (fourteen) days.  6 each  3  . albuterol (PROAIR HFA) 108 (90 BASE) MCG/ACT inhaler Inhale 2 puffs into the lungs every 4 (four) hours as needed for wheezing or shortness of breath.       Marland Kitchen  ALPRAZolam (XANAX) 0.5 MG tablet Take 0.5 mg by mouth 2 (two) times daily as needed for anxiety.       . AMBULATORY NON FORMULARY MEDICATION Take 10,000 Units by mouth daily. Medication Name: Vitamin A  100 capsule  1  . amLODipine (NORVASC) 10 MG tablet Take 1 tablet by mouth every morning.       Marland Kitchen atenolol (TENORMIN) 25 MG tablet Take 25 mg by mouth every morning.       Marland Kitchen azaTHIOprine (IMURAN) 50 MG tablet Take 2 tablets (100 mg total) by mouth every morning.  60 tablet  3  . budesonide (ENTOCORT EC) 3 MG 24 hr capsule Take 3 capsules (9 mg total) by mouth every morning.  90 capsule  2  . budesonide-formoterol (SYMBICORT) 80-4.5 MCG/ACT inhaler Inhale 2 puffs into the lungs 2 (two) times daily as needed (wheezing).      .  Cyanocobalamin (VITAMIN B-12 IJ) Inject as directed every 30 (thirty) days.      . cyclobenzaprine (FLEXERIL) 10 MG tablet Take 1 tablet (10 mg total) by mouth at bedtime as needed for muscle spasms.  30 tablet  1  . dicyclomine (BENTYL) 20 MG tablet Take 1 tablet (20 mg total) by mouth 3 (three) times daily.  90 tablet  1  . diphenoxylate-atropine (LOMOTIL) 2.5-0.025 MG per tablet Take 1 tablet by mouth 4 (four) times daily as needed for diarrhea or loose stools.  30 tablet  2  . eszopiclone (LUNESTA) 2 MG TABS Take 2 mg by mouth at bedtime as needed. Take immediately before bedtime       . fenofibrate 160 MG tablet Take 160 mg by mouth every morning.       . fluconazole (DIFLUCAN) 100 MG tablet Take 100 mg by mouth once a week. On mondays      . HYDROcodone-acetaminophen (NORCO) 10-325 MG per tablet Take 1 tablet by mouth every 4 (four) hours as needed.  40 tablet  0  . hydrocortisone (ANUSOL-HC) 25 MG suppository Insert 1 suppository into rectum twice daily x 1 week, then insert 1 suppository per rectum once every night thereafter until gone.  30 suppository  0  . hydrOXYzine (ATARAX/VISTARIL) 25 MG tablet Take 25-50 mg by mouth every 6 (six) hours as needed for itching (And rash).      . inFLIXimab (REMICADE) 100 MG injection Inject 10 mg/kg into the vein every 6 (six) weeks.       Marland Kitchen loperamide (IMODIUM) 2 MG capsule Take 2 capsules (4 mg total) by mouth 2 (two) times daily.  60 capsule  3  . medium chain triglycerides (MCT OIL) oil Take 15 mLs by mouth 3 (three) times daily.  946 mL  0  . metroNIDAZOLE (FLAGYL) 250 MG tablet Take 1 tablet (250 mg total) by mouth 3 (three) times daily.  21 tablet  0  . omeprazole (PRILOSEC) 40 MG capsule Take 1 capsule (40 mg total) by mouth 2 (two) times daily.  60 capsule  2  . potassium chloride SA (K-DUR,KLOR-CON) 20 MEQ tablet Take 20 mEq by mouth daily.      . pramoxine-hydrocortisone (ANALPRAM HC) cream Apply 1 application topically 4 (four) times daily.   30 g  1  . predniSONE (DELTASONE) 5 MG tablet Take 2 tablets (10 mg total) by mouth daily with breakfast.  100 tablet  0  . promethazine (PHENERGAN) 25 MG suppository Place 25 mg rectally every 6 (six) hours as needed for nausea or vomiting.      Marland Kitchen  promethazine (PHENERGAN) 25 MG tablet Take 1 tablet (25 mg total) by mouth every 6 (six) hours as needed for nausea or vomiting.  30 tablet  1   No current facility-administered medications on file prior to visit.    Allergies  Allergen Reactions  . Codeine Hives and Nausea And Vomiting  . Oxycodone Nausea And Vomiting    SEVERE N/V AND CRAMPING    Family History  Problem Relation Age of Onset  . Colon cancer Mother   . Breast cancer Paternal Grandmother   . Colon polyps Mother   . Colon polyps Father   . Colon polyps Brother   . Thyroid disease Neg Hx     BP 136/90  Pulse 75  Temp(Src) 98.5 F (36.9 C) (Oral)  Ht 5' 7.75" (1.721 m)  Wt 178 lb (80.74 kg)  BMI 27.26 kg/m2  SpO2 98%  Review of Systems denies fever, hoarseness, double vision, sob, polyuria, myalgias, excessive diaphoresis, numbness, seizure, anxiety, and menopausal sxs.  She has headache, easy bruising, rhinorrhea, diarrhea, and palpitations.    Objective:   Physical Exam VS: see vs page GEN: no distress HEAD: head: no deformity eyes: no periorbital swelling, no proptosis external nose and ears are normal mouth: no lesion seen NECK: supple, thyroid is not enlarged CHEST WALL: no deformity LUNGS:  Clear to auscultation.   CV: reg rate and rhythm, no murmur ABD: abdomen is soft, nontender.  no hepatosplenomegaly.  not distended.  no hernia MUSCULOSKELETAL: muscle bulk and strength are grossly normal.  no obvious joint swelling.  gait is normal and steady EXTEMITIES: no deformity.  no ulcer on the feet.  feet are of normal color and temp.  no edema PULSES: dorsalis pedis intact bilat.  no carotid bruit NEURO:  cn 2-12 grossly intact.   readily moves all 4's.   sensation is intact to touch on the feet.  Slight tremor of the hands. SKIN:  Normal texture and temperature.  No rash or suspicious lesion is visible.   NODES:  None palpable at the neck PSYCH: alert, well-oriented.  Does not appear anxious nor depressed.    CXR (2015) no mention of a goiter.   Lab Results  Component Value Date   TSH 0.80 09/17/2013   T3TOTAL 79.1* 07/29/2008       Assessment & Plan:  Mild hyperthyroidism, resolved.  This is usually due to a small multinodular goiter, and will likely recur Tremor, new, not thyroid-related. Weight loss: not thyroid-related    Patient is advised the following: Patient Instructions  This intermittent overactive thyroid is usually due to an hereditary lumpy thyroid.  There is nothing you did to cause this, and nothing you can do to stop it. most of the time, a "lumpy thyroid" will eventually become overactive.  this is usually a slow process, happening over the span of many years. Your weight-loss is not due to the thyroid. Please have the thyroid level checked once or twice a year. I would be happy to see you back here whenever you want.

## 2013-09-25 NOTE — Patient Instructions (Signed)
This intermittent overactive thyroid is usually due to an hereditary lumpy thyroid.  There is nothing you did to cause this, and nothing you can do to stop it. most of the time, a "lumpy thyroid" will eventually become overactive.  this is usually a slow process, happening over the span of many years. Your weight-loss is not due to the thyroid. Please have the thyroid level checked once or twice a year. I would be happy to see you back here whenever you want.

## 2013-09-26 DIAGNOSIS — R7989 Other specified abnormal findings of blood chemistry: Secondary | ICD-10-CM | POA: Insufficient documentation

## 2013-10-07 ENCOUNTER — Ambulatory Visit (INDEPENDENT_AMBULATORY_CARE_PROVIDER_SITE_OTHER): Payer: PRIVATE HEALTH INSURANCE | Admitting: Internal Medicine

## 2013-10-07 ENCOUNTER — Telehealth: Payer: Self-pay | Admitting: *Deleted

## 2013-10-07 ENCOUNTER — Other Ambulatory Visit: Payer: Self-pay | Admitting: *Deleted

## 2013-10-07 ENCOUNTER — Other Ambulatory Visit: Payer: Self-pay | Admitting: Internal Medicine

## 2013-10-07 ENCOUNTER — Encounter: Payer: Self-pay | Admitting: Internal Medicine

## 2013-10-07 DIAGNOSIS — K5010 Crohn's disease of large intestine without complications: Secondary | ICD-10-CM | POA: Diagnosis not present

## 2013-10-07 DIAGNOSIS — R197 Diarrhea, unspecified: Secondary | ICD-10-CM

## 2013-10-07 NOTE — Telephone Encounter (Signed)
Lab in [facility]. Dr. Fuller Plan does not want to refill Norco. Patient notified. She will come for stool study.

## 2013-10-07 NOTE — Telephone Encounter (Signed)
Send GI pathogen panel under DB so result will return to her.

## 2013-10-07 NOTE — Telephone Encounter (Signed)
Is there any way to get stool test done without seeing Dr Olevia Perches. Having a lot of diarrhea an pain very uncomfortable. Watery stool or very slimmy. Does not stink.                   Select Font Size  Spoke with and for the last couple weeks, she has had loose yellow stools without foul odor. She also has cramping.  Hx- Crohn's.She came off Flagyl 2 weeks ago. She is taking Norco for pain and is asking for a refill. She is also asking if she can do stool specimens. Current medications: Entecort 2 tablet/day, Humira, Imuran 2 tablets in AM and Lomotil. Dr. Olevia Perches out of office. Please, advise.

## 2013-10-09 ENCOUNTER — Encounter (HOSPITAL_COMMUNITY): Payer: 59

## 2013-10-09 LAB — TB SKIN TEST
INDURATION: 0 mm
TB Skin Test: NEGATIVE

## 2013-10-11 ENCOUNTER — Other Ambulatory Visit: Payer: PRIVATE HEALTH INSURANCE

## 2013-10-11 DIAGNOSIS — R197 Diarrhea, unspecified: Secondary | ICD-10-CM

## 2013-10-11 LAB — GASTROINTESTINAL PATHOGEN PANEL PCR
C. difficile Tox A/B, PCR: NEGATIVE
CRYPTOSPORIDIUM, PCR: NEGATIVE
Campylobacter, PCR: NEGATIVE
E COLI (ETEC) LT/ST, PCR: NEGATIVE
E COLI (STEC) STX1/STX2, PCR: NEGATIVE
E COLI 0157, PCR: NEGATIVE
Giardia lamblia, PCR: NEGATIVE
Norovirus, PCR: NEGATIVE
Rotavirus A, PCR: NEGATIVE
SHIGELLA, PCR: NEGATIVE
Salmonella, PCR: NEGATIVE

## 2013-11-08 ENCOUNTER — Telehealth: Payer: Self-pay | Admitting: *Deleted

## 2013-11-08 ENCOUNTER — Other Ambulatory Visit: Payer: Self-pay | Admitting: Internal Medicine

## 2013-11-08 MED ORDER — HYDROCODONE-ACETAMINOPHEN 10-325 MG PO TABS: 1.0000 | ORAL_TABLET | ORAL | Status: DC | PRN

## 2013-11-08 NOTE — Telephone Encounter (Signed)
Original authorizing provider: [name], MD        [name] would like a refill of the following medications:    HYDROcodone-acetaminophen ([place]) 10-325 MG per tablet [name], MD]        Preferred pharmacy: [facility] [place], [place]:    Still having bad cramping pain comes and goes. Stool still watery. Normal pain usually have been out of pain medications for about 3 weeks just need refill to get me through till I see Dr Olevia Perches in October. Last time I asked another Dr said no just asking for a little relief from the pain. I can be reached by cell phone [phone]      Spoke with patient re: rx request for hydrocodone. She states she is having her usual cramping and pain. She has been out of pain medication for 3 weeks. States she is having more pain today and requested a refill until her OV in October. She is having watery stools without blood. Please, advise.

## 2013-11-08 NOTE — Telephone Encounter (Signed)
OK to refill Norco ?earlier? She does not abuse it.

## 2013-11-08 NOTE — Telephone Encounter (Signed)
Patient notified that rx is up front for pick up.

## 2013-11-22 ENCOUNTER — Other Ambulatory Visit: Payer: Self-pay | Admitting: Internal Medicine

## 2013-11-30 ENCOUNTER — Encounter: Payer: Self-pay | Admitting: Internal Medicine

## 2013-12-03 ENCOUNTER — Other Ambulatory Visit: Payer: Self-pay | Admitting: Internal Medicine

## 2013-12-11 ENCOUNTER — Telehealth: Payer: Self-pay | Admitting: *Deleted

## 2013-12-11 NOTE — Telephone Encounter (Signed)
[facility] has approved patient's Humira medication from 11/10/13-12/09/16. Case ID: [id number]

## 2013-12-13 NOTE — Telephone Encounter (Signed)
We have gotten a second approval for Humira from [facility] with a different case number. This letter indicates that patient Humira has been approved from 11/12/13-12/12/14.  Case ID: [id number].

## 2013-12-24 ENCOUNTER — Encounter: Payer: Self-pay | Admitting: Internal Medicine

## 2013-12-24 ENCOUNTER — Other Ambulatory Visit (INDEPENDENT_AMBULATORY_CARE_PROVIDER_SITE_OTHER): Payer: PRIVATE HEALTH INSURANCE

## 2013-12-24 ENCOUNTER — Ambulatory Visit (INDEPENDENT_AMBULATORY_CARE_PROVIDER_SITE_OTHER): Payer: PRIVATE HEALTH INSURANCE | Admitting: Internal Medicine

## 2013-12-24 VITALS — BP 150/82 | HR 76 | Ht 67.75 in | Wt 164.4 lb

## 2013-12-24 DIAGNOSIS — R197 Diarrhea, unspecified: Secondary | ICD-10-CM

## 2013-12-24 DIAGNOSIS — K50018 Crohn's disease of small intestine with other complication: Secondary | ICD-10-CM | POA: Diagnosis not present

## 2013-12-24 DIAGNOSIS — L989 Disorder of the skin and subcutaneous tissue, unspecified: Secondary | ICD-10-CM | POA: Diagnosis not present

## 2013-12-24 DIAGNOSIS — D849 Immunodeficiency, unspecified: Secondary | ICD-10-CM

## 2013-12-24 DIAGNOSIS — K50919 Crohn's disease, unspecified, with unspecified complications: Secondary | ICD-10-CM

## 2013-12-24 DIAGNOSIS — D899 Disorder involving the immune mechanism, unspecified: Secondary | ICD-10-CM

## 2013-12-24 DIAGNOSIS — K130 Diseases of lips: Secondary | ICD-10-CM

## 2013-12-24 LAB — COMPREHENSIVE METABOLIC PANEL
ALT: 12 U/L (ref 0–35)
AST: 18 U/L (ref 0–37)
Albumin: 3.7 g/dL (ref 3.5–5.2)
Alkaline Phosphatase: 113 U/L (ref 39–117)
BUN: 14 mg/dL (ref 6–23)
CALCIUM: 9.6 mg/dL (ref 8.4–10.5)
CO2: 23 meq/L (ref 19–32)
Chloride: 105 mEq/L (ref 96–112)
Creatinine, Ser: 0.9 mg/dL (ref 0.4–1.2)
GFR: 71.99 mL/min (ref >60.00)
Glucose, Bld: 116 mg/dL — ABNORMAL HIGH (ref 70–99)
Potassium: 3.5 mEq/L (ref 3.5–5.1)
SODIUM: 138 meq/L (ref 135–145)
TOTAL PROTEIN: 8.9 g/dL — AB (ref 6.0–8.3)
Total Bilirubin: 0.4 mg/dL (ref 0.2–1.2)

## 2013-12-24 LAB — CBC WITH DIFFERENTIAL/PLATELET
Basophils Absolute: 0 10*3/uL (ref 0.0–0.1)
Basophils Relative: 0.4 % (ref 0.0–3.0)
EOS ABS: 0.2 10*3/uL (ref 0.0–0.7)
Eosinophils Relative: 1.5 % (ref 0.0–5.0)
HCT: 42.3 % (ref 36.0–46.0)
HEMOGLOBIN: 14.1 g/dL (ref 12.0–15.0)
Lymphocytes Relative: 37 % (ref 12.0–46.0)
Lymphs Abs: 3.9 10*3/uL (ref 0.7–4.0)
MCHC: 33.4 g/dL (ref 30.0–36.0)
MCV: 90.5 fl (ref 78.0–100.0)
MONO ABS: 1 10*3/uL (ref 0.1–1.0)
Monocytes Relative: 9.5 % (ref 3.0–12.0)
NEUTROS ABS: 5.5 10*3/uL (ref 1.4–7.7)
NEUTROS PCT: 51.6 % (ref 43.0–77.0)
Platelets: 330 10*3/uL (ref 150.0–400.0)
RBC: 4.68 Mil/uL (ref 3.87–5.11)
RDW: 13 % (ref 11.5–15.5)
WBC: 10.6 10*3/uL — ABNORMAL HIGH (ref 4.0–10.5)

## 2013-12-24 LAB — SEDIMENTATION RATE: Sed Rate: 51 mm/hr — ABNORMAL HIGH (ref 0–22)

## 2013-12-24 MED ORDER — DICYCLOMINE HCL 20 MG PO TABS: 20.0000 mg | ORAL_TABLET | Freq: Three times a day (TID) | ORAL | Status: DC

## 2013-12-24 MED ORDER — BUDESONIDE 3 MG PO CP24: 9.0000 mg | ORAL_CAPSULE | ORAL | Status: DC

## 2013-12-24 MED ORDER — PREDNISONE 5 MG PO TABS: 10.0000 mg | ORAL_TABLET | Freq: Every day | ORAL | Status: DC

## 2013-12-24 MED ORDER — LOPERAMIDE HCL 2 MG PO CAPS: 4.0000 mg | ORAL_CAPSULE | Freq: Two times a day (BID) | ORAL | Status: DC

## 2013-12-24 MED ORDER — OMEPRAZOLE 40 MG PO CPDR: 40.0000 mg | DELAYED_RELEASE_CAPSULE | Freq: Two times a day (BID) | ORAL | Status: DC

## 2013-12-24 MED ORDER — FLUCONAZOLE 100 MG PO TABS: 100.0000 mg | ORAL_TABLET | Freq: Every day | ORAL | Status: DC

## 2013-12-24 MED ORDER — DIPHENOXYLATE-ATROPINE 2.5-0.025 MG PO TABS: ORAL_TABLET | ORAL | Status: DC

## 2013-12-24 MED ORDER — PREDNISONE 10 MG PO TABS: ORAL_TABLET | ORAL | Status: DC

## 2013-12-24 MED ORDER — CYCLOBENZAPRINE HCL 10 MG PO TABS: 10.0000 mg | ORAL_TABLET | Freq: Every evening | ORAL | Status: DC | PRN

## 2013-12-24 MED ORDER — PRAMOXINE-HC 1-1 % EX CREA: 1.0000 "application " | TOPICAL_CREAM | Freq: Four times a day (QID) | CUTANEOUS | Status: DC

## 2013-12-24 MED ORDER — AZATHIOPRINE 50 MG PO TABS: 100.0000 mg | ORAL_TABLET | ORAL | Status: DC

## 2013-12-24 NOTE — Progress Notes (Signed)
[name] 11-Dec-1960 [id number]  Note: This dictation was prepared with Dragon digital system. Any transcriptional errors that result from this procedure are unintentional.   History of Present Illness:  This is a 53 year old white female with chronic diarrhea and abdominal pain, nausea, vomiting and weight loss. Her last office visit was in July 2015. She has a history of Crohn's disease involving the small bowel. Currently, she is on Humira 40 mg every 2 weeks, Entocort 9 mg daily, Imuran 100 mg daily and Prednisone 5 mg qd.. Her last colonoscopy in November 2012 showed a widely patent ileocolic anastomosis, no active Crohns. She also had an adenomatous polyp with low-grade dysplasia. She was started on MCT 15 cc 3 times a day for suspected malabsorption. She has lost 14 pounds since her last visit in July, currently weighing 164 pounds. She has continued diarrhea. Patient has had a reaction to Humira. She developed a pruritic maculopapular rash on her abdomen within 2 days of Humira injection. She already has a diagnosis of psoriasis but she feels that these lesions are different. She has an appointment with a dermatologist for October 30. There has been no fever or rectal bleeding.    Past Medical History  Diagnosis Date  . Crohn's disease   . Depression   . GERD (gastroesophageal reflux disease)   . Vitamin B12 deficiency   . Psoriasis     SKIN  . Family history of malignant neoplasm of gastrointestinal tract   . Crohn's colitis   . PONV (postoperative nausea and vomiting)   . Hypertension   . H/O hiatal hernia   . Seasonal asthma   . S/P dilatation of esophageal stricture   . History of adenomatous polyp of colon   . History of small bowel obstruction     SECONDARY CROHN'S STRICTURE  S/P COLECTOMY  . History of gastritis     AND HX ILEITIS  . Chronic diarrhea   . Internal hemorrhoids   . History of kidney stones   . Right ureteral stone   . Hypopotassemia   .  Chronic disease anemia   . Urgency of urination   . Arthritis   . Hyperlipidemia   . Rotavirus infection 05/31/2013    Past Surgical History  Procedure Laterality Date  . Carpal tunnel release Right 2000  . Anal fissure repair  1990's  . Cystoscopy with retrograde pyelogram, ureteroscopy and stent placement Left 03/08/2013    Procedure: CYSTOSCOPY WITH RETROGRADE PYELOGRAM, URETEROSCOPY AND STENT PLACEMENT stone basketing;  Surgeon: Alexis Frock, MD;  Location: [facility];  Service: Urology;  Laterality: Left;  . Vaginal hysterectomy  1992  . Cholecystectomy  2002  . Laparoscopic ileocecectomy  10-09-2008    AND APPENDECTOMY (TERMINAL ILEITIS & PARTIAL SBO)  . Dx laparoscopy converted to open right colecctomy  09-15-2010    ANASTOMOTIC STRICTURE  . Cystoscopy with retrograde pyelogram, ureteroscopy and stent placement Right 04/03/2013    Procedure: [place], URETEROSCOPY AND STENT PLACEMENT;  Surgeon: Alexis Frock, MD;  Location: [facility];  Service: Urology;  Laterality: Right;    Allergies  Allergen Reactions  . Codeine Hives and Nausea And Vomiting  . Oxycodone Nausea And Vomiting    SEVERE N/V AND CRAMPING    Family history and social history have been reviewed.  Review of Systems: Rectal soreness. And abdominal pain. Diarrhea weight loss  The remainder of the 10 point ROS is negative except as outlined in the H&P  Physical Exam:  General Appearance chronically ill-appearing, in no distress Eyes  Non icteric  HEENT  Non traumatic, normocephalic multiple skin lesions and hand consistent with psoriasis Mouth No lesion, tongue- glossitis, decreased rugal pattern  cheilosis Neck Supple without adenopathy, thyroid not enlarged, no carotid bruits, no JVD Lungs  bilaterally expiratory wheezes COR Normal S1, normal S2, regular rhythm, no murmur, quiet precordium Abdomen soft with normal active bowel sounds. No distention. Mild tenderness  around the umbilicus in the right upper quadrant. Liver edge at costal margin. Multiple maculopapular lesions of her abdomen and around the umbilicus Rectal patient would not allow digital exam. The perianal area is erythematous and excoriated Extremities  No pedal edema Skin multiple maculopapular lesions of the abdomen suprapubic area and around the umbilicus Neurological Alert and oriented x 3 Psychological Normal mood and affect  Assessment and Plan:   Problem #47 53 year old white female with advanced Crohn's disease of the small bowel, chronic diarrhea and malabsorption. She has continued weight loss. She now has had a reaction to Humira. We will discontinue Humira and increase prednisone to 30 mg daily for 2 weeks and then decrease by 5 mg every 2 weeks. We will check her blood tests today which include CBC, metabolic panel, and sedimentation rate. She needs a refill on multiple medications. We will also refill Diflucan 100 mg 1 by mouth daily.Consider referring her to [facility] for second opinion.  Problem #2 History of adenomatous polyps. She is due for a colonoscopy but we will wait until she gets through the reaction from Humira first.  Patient should continue Entocort and Imuran.    [name] 12/24/2013

## 2013-12-24 NOTE — Patient Instructions (Addendum)
Your physician has requested that you go to the basement for the following lab work before leaving today: CMET, Sed Rate, CBC  Dr Olevia Perches has advised that you be on a prednisone taper. The taper instructions are as follows: 30 mg daily x 2 weeks 25 mg daily x 2 weeks 20 mg daily x 2 weeks 15 mg daily x 2 weeks 10 mg daily x 2 weeks 5 mg daily thereafter  We have sent the following medications to your pharmacy for you to pick up at your convenience: Diflucan Analpram Azathioprine Flexeril Dicyclomine Lomotil Imodium Prednisone 5, 10 mg  Please discontinue Humira.  Please follow up with Dr Olevia Perches in 6 weeks.

## 2013-12-25 ENCOUNTER — Other Ambulatory Visit: Payer: Self-pay | Admitting: *Deleted

## 2013-12-25 ENCOUNTER — Other Ambulatory Visit: Payer: Medicare Other

## 2013-12-25 DIAGNOSIS — R799 Abnormal finding of blood chemistry, unspecified: Secondary | ICD-10-CM

## 2013-12-27 ENCOUNTER — Other Ambulatory Visit: Payer: Medicare Other

## 2013-12-27 ENCOUNTER — Other Ambulatory Visit: Payer: Self-pay | Admitting: *Deleted

## 2013-12-27 DIAGNOSIS — Z23 Encounter for immunization: Secondary | ICD-10-CM

## 2013-12-27 DIAGNOSIS — R778 Other specified abnormalities of plasma proteins: Secondary | ICD-10-CM

## 2013-12-27 LAB — PROTEIN ELECTROPHORESIS, SERUM
Albumin ELP: 51.6 % — ABNORMAL LOW (ref 55.8–66.1)
Alpha-1-Globulin: 4 % (ref 2.9–4.9)
Alpha-2-Globulin: 11.7 % (ref 7.1–11.8)
Beta 2: 6.5 % (ref 3.2–6.5)
Beta Globulin: 6.9 % (ref 4.7–7.2)
GAMMA GLOBULIN: 19.3 % — AB (ref 11.1–18.8)
Total Protein, Serum Electrophoresis: 7.7 g/dL (ref 6.0–8.3)

## 2013-12-28 LAB — IGG: IgG (Immunoglobin G), Serum: 1400 mg/dL (ref 690–1700)

## 2013-12-31 LAB — IMMUNOFIXATION ELECTROPHORESIS
IGG (IMMUNOGLOBIN G), SERUM: 1400 mg/dL (ref 690–1700)
IGM, SERUM: 100 mg/dL (ref 52–322)
IgA: 431 mg/dL — ABNORMAL HIGH (ref 69–380)
TOTAL PROTEIN, SERUM ELECTROPHOR: 7.8 g/dL (ref 6.0–8.3)

## 2014-02-06 ENCOUNTER — Other Ambulatory Visit: Payer: Self-pay | Admitting: Internal Medicine

## 2014-02-06 MED ORDER — HYDROCODONE-ACETAMINOPHEN 10-325 MG PO TABS: 1.0000 | ORAL_TABLET | ORAL | Status: DC | PRN

## 2014-02-06 NOTE — Telephone Encounter (Signed)
Rx created and awaiting Dr Nichola Sizer signature.

## 2014-02-08 ENCOUNTER — Other Ambulatory Visit: Payer: Self-pay | Admitting: Internal Medicine

## 2014-02-08 ENCOUNTER — Encounter: Payer: Self-pay | Admitting: Internal Medicine

## 2014-02-10 MED ORDER — PROMETHAZINE HCL 25 MG PO TABS: 25.0000 mg | ORAL_TABLET | Freq: Four times a day (QID) | ORAL | Status: DC | PRN

## 2014-02-18 ENCOUNTER — Encounter: Payer: Self-pay | Admitting: Internal Medicine

## 2014-02-18 ENCOUNTER — Other Ambulatory Visit: Payer: Self-pay | Admitting: *Deleted

## 2014-02-18 ENCOUNTER — Other Ambulatory Visit (INDEPENDENT_AMBULATORY_CARE_PROVIDER_SITE_OTHER): Payer: PRIVATE HEALTH INSURANCE

## 2014-02-18 ENCOUNTER — Ambulatory Visit (INDEPENDENT_AMBULATORY_CARE_PROVIDER_SITE_OTHER): Payer: PRIVATE HEALTH INSURANCE | Admitting: Internal Medicine

## 2014-02-18 VITALS — BP 104/62 | HR 74 | Ht 68.0 in | Wt 169.4 lb

## 2014-02-18 DIAGNOSIS — D849 Immunodeficiency, unspecified: Secondary | ICD-10-CM | POA: Diagnosis not present

## 2014-02-18 DIAGNOSIS — D899 Disorder involving the immune mechanism, unspecified: Secondary | ICD-10-CM

## 2014-02-18 DIAGNOSIS — E876 Hypokalemia: Secondary | ICD-10-CM

## 2014-02-18 DIAGNOSIS — K50919 Crohn's disease, unspecified, with unspecified complications: Secondary | ICD-10-CM | POA: Diagnosis not present

## 2014-02-18 LAB — COMPREHENSIVE METABOLIC PANEL
ALT: 11 U/L (ref 0–35)
AST: 15 U/L (ref 0–37)
Albumin: 3.8 g/dL (ref 3.5–5.2)
Alkaline Phosphatase: 105 U/L (ref 39–117)
BILIRUBIN TOTAL: 0.2 mg/dL (ref 0.2–1.2)
BUN: 9 mg/dL (ref 6–23)
CHLORIDE: 106 meq/L (ref 96–112)
CO2: 27 mEq/L (ref 19–32)
Calcium: 9 mg/dL (ref 8.4–10.5)
Creatinine, Ser: 0.8 mg/dL (ref 0.4–1.2)
GFR: 85.39 mL/min (ref >60.00)
GLUCOSE: 124 mg/dL — AB (ref 70–99)
Potassium: 3.2 mEq/L — ABNORMAL LOW (ref 3.5–5.1)
SODIUM: 138 meq/L (ref 135–145)
Total Protein: 7.4 g/dL (ref 6.0–8.3)

## 2014-02-18 LAB — SEDIMENTATION RATE: SED RATE: 67 mm/h — AB (ref 0–22)

## 2014-02-18 MED ORDER — LORAZEPAM 0.5 MG PO TABS: 0.5000 mg | ORAL_TABLET | Freq: Every day | ORAL | Status: DC | PRN

## 2014-02-18 MED ORDER — POLYETHYLENE GLYCOL 3350 17 GM/SCOOP PO POWD: 1.0000 | ORAL | Status: DC

## 2014-02-18 MED ORDER — POTASSIUM CHLORIDE CRYS ER 20 MEQ PO TBCR: 20.0000 meq | EXTENDED_RELEASE_TABLET | Freq: Two times a day (BID) | ORAL | Status: DC

## 2014-02-18 MED ORDER — FLUCONAZOLE 100 MG PO TABS: 100.0000 mg | ORAL_TABLET | Freq: Every day | ORAL | Status: DC

## 2014-02-18 NOTE — Patient Instructions (Addendum)
You have been scheduled for a colonoscopy. Please follow written instructions given to you at your visit today.  Please pick up your prep kit at the pharmacy within the next 1-3 days. If you use inhalers (even only as needed), please bring them with you on the day of your procedure. Your physician has requested that you go to www.startemmi.com and enter the access code given to you at your visit today. This web site gives a general overview about your procedure. However, you should still follow specific instructions given to you by our office regarding your preparation for the procedure.  Your physician has requested that you go to the basement for the following lab work before leaving today: Sed, CMET  We have sent the following medications to your pharmacy for you to pick up at your convenience: Ativan Diflucan  Please decrease Prilosec to once daily dosing.  Please discontinue alprazolam (you are taking ativan instead).  Continue Prednisone 15 mg daily for now.  We have contacted infectious disease to get an appointment for you. They should call you with that appointment. If not, please call their office at [phone].  CC:Dr D.[employer]

## 2014-02-18 NOTE — Progress Notes (Signed)
[name] 1960/07/17 [id number]  Note: This dictation was prepared with Dragon digital system. Any transcriptional errors that result from this procedure are unintentional.   History of Present Illness:  This is a 53 year old white female with Crohn's disease of the small bowel since 2007. This was diagnosed on a small bowel capsule endoscopy. She had a terminal ileal resection in 2008 and again in July 2012. She has had long-term steroid exposure and chronic diarrhea. She switched from Remicade to Humira but developed a rash and Humira was discontinued 2 months ago. She is currently doing reasonably well off biologicals and on Entocort 9 mg daily, Imuran 100 mg daily, prednisone 15 mg daily which started at 30 mg. Her last colonoscopy in November 2012 showed an adenomatous polyp in the right colon with dysplasia. She is due for a recall colonoscopy. She has a yeast infection in her mouth, under her breast, in her vagina, in the rectal area and suprapubic area. She has only partially responded to Diflucan. She saw a dermatologist but was unable to take the prescribed antibiotics. One of them was doxycycline. She has gained 5 pounds since her last office visit.    Past Medical History  Diagnosis Date  . Crohn's disease   . Depression   . GERD (gastroesophageal reflux disease)   . Vitamin B12 deficiency   . Psoriasis     SKIN  . Family history of malignant neoplasm of gastrointestinal tract   . Crohn's colitis   . PONV (postoperative nausea and vomiting)   . Hypertension   . H/O hiatal hernia   . Seasonal asthma   . S/P dilatation of esophageal stricture   . History of adenomatous polyp of colon   . History of small bowel obstruction     SECONDARY CROHN'S STRICTURE  S/P COLECTOMY  . History of gastritis     AND HX ILEITIS  . Chronic diarrhea   . Internal hemorrhoids   . History of kidney stones   . Right ureteral stone   . Hypopotassemia   . Chronic disease anemia   .  Urgency of urination   . Arthritis   . Hyperlipidemia   . Rotavirus infection 05/31/2013    Past Surgical History  Procedure Laterality Date  . Carpal tunnel release Right 2000  . Anal fissure repair  1990's  . Cystoscopy with retrograde pyelogram, ureteroscopy and stent placement Left 03/08/2013    Procedure: CYSTOSCOPY WITH RETROGRADE PYELOGRAM, URETEROSCOPY AND STENT PLACEMENT stone basketing;  Surgeon: Alexis Frock, MD;  Location: [facility];  Service: Urology;  Laterality: Left;  . Vaginal hysterectomy  1992  . Cholecystectomy  2002  . Laparoscopic ileocecectomy  10-09-2008    AND APPENDECTOMY (TERMINAL ILEITIS & PARTIAL SBO)  . Dx laparoscopy converted to open right colecctomy  09-15-2010    ANASTOMOTIC STRICTURE  . Cystoscopy with retrograde pyelogram, ureteroscopy and stent placement Right 04/03/2013    Procedure: [place], URETEROSCOPY AND STENT PLACEMENT;  Surgeon: Alexis Frock, MD;  Location: [facility];  Service: Urology;  Laterality: Right;    Allergies  Allergen Reactions  . Codeine Hives and Nausea And Vomiting  . Oxycodone Nausea And Vomiting    SEVERE N/V AND CRAMPING  . Humira [Adalimumab] Rash    Family history and social history have been reviewed.  Review of Systems: Glossitis. Sore mouth corners. Rash. Diarrhea. Weight gain 5 pounds. crampy  abdominal pain  The remainder of the 10 point ROS is negative except  as outlined in the H&P  Physical Exam: General Appearance chronically ill-appearing, in no distress Eyes  Non icteric  HEENT  Non traumatic, normocephalic  Mouth No lesion, tongue smooth consistent with glossitis,  cheilosis Neck Supple without adenopathy, thyroid not enlarged, no carotid bruits, no JVD Lungs Clear to auscultation bilaterally COR Normal S1, normal S2, regular rhythm, no murmur, quiet precordium Abdomen tender in the epigastric area. Quiet bowel sounds. Tenderness in right lower quadrant. No  rebound or mass Rectal normal perianal area. Normal rectal sphincter tone. Small amount of Hemoccult negative stool Extremities  No pedal edema Skin No lesions Neurological Alert and oriented x 3 Psychological Normal mood and affect  Assessment and Plan:   Problem #10 53 year old white female with Crohn's disease of the small bowel, malabsorption and chronic diarrhea. She is having candidiasis which has not responded to Diflucan. We will refer her for an infectious disease consult to see if there is an alternative approach to treat Candida in an immunosuppressed state. She will remain on prednisone 15 mg daily as well as Imuran 100 mg daily. We will check her metabolic panel and sedimentation rate today. We will also switch her from Xanax to Ativan 0.5 mg as needed and we will refill her Diflucan.  Problem #2 History of a dysplastic adenomatous polyp in 2012. She is due for a recall colonoscopy. This will be scheduled using a MiraLAX prep.    [name] 02/18/2014

## 2014-03-04 ENCOUNTER — Other Ambulatory Visit (INDEPENDENT_AMBULATORY_CARE_PROVIDER_SITE_OTHER): Payer: PRIVATE HEALTH INSURANCE

## 2014-03-04 DIAGNOSIS — E876 Hypokalemia: Secondary | ICD-10-CM

## 2014-03-04 LAB — POTASSIUM: Potassium: 4 mEq/L (ref 3.5–5.1)

## 2014-03-12 ENCOUNTER — Encounter: Payer: Self-pay | Admitting: Internal Medicine

## 2014-03-18 ENCOUNTER — Encounter: Payer: Self-pay | Admitting: Internal Medicine

## 2014-03-18 ENCOUNTER — Ambulatory Visit (INDEPENDENT_AMBULATORY_CARE_PROVIDER_SITE_OTHER): Payer: PRIVATE HEALTH INSURANCE | Admitting: Internal Medicine

## 2014-03-18 ENCOUNTER — Other Ambulatory Visit: Payer: Self-pay | Admitting: Internal Medicine

## 2014-03-18 VITALS — BP 123/80 | HR 81 | Temp 98.3°F | Wt 168.0 lb

## 2014-03-18 DIAGNOSIS — K130 Diseases of lips: Secondary | ICD-10-CM | POA: Diagnosis not present

## 2014-03-18 DIAGNOSIS — L259 Unspecified contact dermatitis, unspecified cause: Secondary | ICD-10-CM | POA: Diagnosis not present

## 2014-03-18 DIAGNOSIS — B372 Candidiasis of skin and nail: Secondary | ICD-10-CM

## 2014-03-18 MED ORDER — FLUCONAZOLE 200 MG PO TABS: 200.0000 mg | ORAL_TABLET | Freq: Every day | ORAL | Status: DC

## 2014-03-18 MED ORDER — CLOTRIMAZOLE 1 % EX OINT: 1.0000 "application " | TOPICAL_OINTMENT | Freq: Two times a day (BID) | CUTANEOUS | Status: DC

## 2014-03-18 MED ORDER — NYSTATIN 100000 UNIT/GM EX POWD: CUTANEOUS | Status: DC

## 2014-03-18 NOTE — Progress Notes (Signed)
Subjective:    Patient ID: [name], female    DOB: 1961/01/05, 54 y.o.   MRN: [id number]  HPI  54yo F with Crohns disease, taking imuran, prednisone 39m and steroid suppositories, dx in the last 9 years. C/b IBS and spastic colon. Previously on humira, that did not tolerate when she was reintroduced to it in August with breaking out with skin rash, fungal/bacterial skin infection to torso, and thrush. Was placed on fluconazole 1065mdaily plus antifungal cream, econazole plus triamcinolone cream that was prescribed by dermatology. And treated with doxycycline, which she did not tolerate. Rash mainly mouth/gum line, under breast, mons pubis. She also has dryness and cracking of skin at corner of lips  Planning to have repeat colonoscopy in March  Current Outpatient Prescriptions on File Prior to Visit  Medication Sig Dispense Refill  . albuterol (PROAIR HFA) 108 (90 BASE) MCG/ACT inhaler Inhale 2 puffs into the lungs every 4 (four) hours as needed for wheezing or shortness of breath.     . AMBULATORY NON FORMULARY MEDICATION Take 10,000 Units by mouth daily. Medication Name: Vitamin A 100 capsule 1  . amLODipine (NORVASC) 10 MG tablet Take 1 tablet by mouth every morning.     . Marland Kitchentenolol (TENORMIN) 25 MG tablet Take 25 mg by mouth every morning.     . Marland KitchenzaTHIOprine (IMURAN) 50 MG tablet Take 2 tablets (100 mg total) by mouth every morning. 60 tablet 3  . budesonide (ENTOCORT EC) 3 MG 24 hr capsule Take 3 capsules (9 mg total) by mouth every morning. 90 capsule 2  . budesonide-formoterol (SYMBICORT) 80-4.5 MCG/ACT inhaler Inhale 2 puffs into the lungs 2 (two) times daily as needed (wheezing).    . Cyanocobalamin (VITAMIN B-12 IJ) Inject as directed every 30 (thirty) days.    . cyclobenzaprine (FLEXERIL) 10 MG tablet Take 1 tablet (10 mg total) by mouth at bedtime as needed for muscle spasms. 30 tablet 1  . diclofenac (VOLTAREN) 75 MG EC tablet     . diphenoxylate-atropine (LOMOTIL)  2.5-0.025 MG per tablet TAKE TWO TABLETS BY MOUTH TWICE DAILY 120 tablet 1  . econazole nitrate 1 % cream     . eszopiclone (LUNESTA) 2 MG TABS Take 2 mg by mouth at bedtime as needed. Take immediately before bedtime     . fenofibrate 160 MG tablet Take 160 mg by mouth every morning.     . fluconazole (DIFLUCAN) 100 MG tablet Take 1 tablet (100 mg total) by mouth daily. 30 tablet 0  . HYDROcodone-acetaminophen (NORCO) 10-325 MG per tablet Take 1 tablet by mouth every 4 (four) hours as needed. 40 tablet 0  . hydrocortisone (ANUSOL-HC) 25 MG suppository Insert 1 suppository into rectum twice daily x 1 week, then insert 1 suppository per rectum once every night thereafter until gone. 30 suppository 0  . hydrOXYzine (ATARAX/VISTARIL) 25 MG tablet Take 25-50 mg by mouth every 6 (six) hours as needed for itching (And rash).    . Marland Kitchenoperamide (IMODIUM) 2 MG capsule Take 2 capsules (4 mg total) by mouth 2 (two) times daily. 60 capsule 3  . LORazepam (ATIVAN) 0.5 MG tablet Take 1 tablet (0.5 mg total) by mouth daily as needed for anxiety. 30 tablet 1  . omeprazole (PRILOSEC) 40 MG capsule Take 1 capsule (40 mg total) by mouth daily. 30 capsule 0  . polyethylene glycol powder (GLYCOLAX/MIRALAX) powder Take 255 g by mouth as directed. For colonoscopy prep 255 g 0  . potassium chloride SA (  K-DUR,KLOR-CON) 20 MEQ tablet Take 1 tablet (20 mEq total) by mouth 2 (two) times daily. 28 tablet 0  . pramoxine-hydrocortisone (ANALPRAM HC) cream Apply 1 application topically 4 (four) times daily. 30 g 2  . predniSONE (DELTASONE) 10 MG tablet Take as directed 100 tablet 0  . predniSONE (DELTASONE) 5 MG tablet Take 2 tablets (10 mg total) by mouth daily with breakfast. 100 tablet 0  . promethazine (PHENERGAN) 25 MG suppository Place 25 mg rectally every 6 (six) hours as needed for nausea or vomiting.    . promethazine (PHENERGAN) 25 MG tablet Take 1 tablet (25 mg total) by mouth every 6 (six) hours as needed for nausea or  vomiting. 30 tablet 0  . triamcinolone ointment (KENALOG) 0.1 %     . dicyclomine (BENTYL) 20 MG tablet Take 1 tablet (20 mg total) by mouth 3 (three) times daily. (Patient not taking: Reported on 03/18/2014) 90 tablet 2   No current facility-administered medications on file prior to visit.     Review of Systems + irritable, sleep deprivation/insomnia    Objective:   Physical Exam BP 123/80 mmHg  Pulse 81  Temp(Src) 98.3 F (36.8 C) (Oral)  Wt 168 lb (76.204 kg) Physical Exam  Constitutional:  oriented to person, place, and time. appears well-developed and well-nourished. No distress.  HENT: macular rash at corner's of mouth with cracking of skin at corners bilaterally. No OP thrush Mouth/Throat: Oropharynx is clear and moist. No oropharyngeal exudate.  Cardiovascular: Normal rate, regular rhythm and normal heart sounds. Exam reveals no gallop and no friction rub.  No murmur heard.  Pulmonary/Chest: Effort normal and breath sounds normal. No respiratory distress.  has no wheezes.  Abdominal: Soft. Bowel sounds are normal.  exhibits no distension. There is no tenderness.  Lymphadenopathy: no cervical adenopathy.  Neurological: alert and oriented to person, place, and time.  Skin: erythamatous plaques underneath her breast bilaterally, plus scattered macules to mons pubis Psychiatric: a normal mood and affect.  behavior is normal.       Assessment & Plan:  Angular cheliltis =will swab for staph aureus but empirically treat with fluconazole 245m daily x 14 d  Contact dermatitis to mouth = will have her discard her current lipbalm, possibly causing contact dermatitis and use different brand  Candidal skin infection = will do antifungal cream and powder. Ask her to stop using steroid cream  rtc in 2 wk to see if improved

## 2014-03-19 ENCOUNTER — Encounter: Payer: PRIVATE HEALTH INSURANCE | Admitting: Internal Medicine

## 2014-03-19 MED ORDER — HYDROCODONE-ACETAMINOPHEN 10-325 MG PO TABS: 1.0000 | ORAL_TABLET | ORAL | Status: DC | PRN

## 2014-03-26 LAB — FUNGUS CULTURE W SMEAR
ORGANISM ID, BACTERIA: NO GROWTH
Smear Result: NONE SEEN

## 2014-04-08 ENCOUNTER — Other Ambulatory Visit: Payer: Self-pay | Admitting: Internal Medicine

## 2014-04-10 ENCOUNTER — Other Ambulatory Visit: Payer: Self-pay | Admitting: *Deleted

## 2014-04-10 MED ORDER — HYDROCODONE-ACETAMINOPHEN 10-325 MG PO TABS: 1.0000 | ORAL_TABLET | ORAL | Status: DC | PRN

## 2014-04-10 MED ORDER — OMEPRAZOLE 40 MG PO CPDR: 40.0000 mg | DELAYED_RELEASE_CAPSULE | Freq: Every day | ORAL | Status: DC

## 2014-04-10 MED ORDER — PROMETHAZINE HCL 25 MG PO TABS: 25.0000 mg | ORAL_TABLET | Freq: Four times a day (QID) | ORAL | Status: DC | PRN

## 2014-04-10 NOTE — Telephone Encounter (Signed)
-----   Message from Dow Adolph sent at 04/10/2014  1:04 PM EST ----- Vickie @ [facility]  [phone]  Wants to know if Dr. Olevia Perches has prescribed a pain med

## 2014-04-10 NOTE — Telephone Encounter (Signed)
Received a call from [place] verifying if patient gets pain medication from Dr. Olevia Perches. Told them we sent a new rx for pain medication today.

## 2014-04-10 NOTE — Telephone Encounter (Signed)
Printed out a [place] copy of the Rx HYDROcodone-acetaminophen (NORCO) 10-325 mg per tablet. I called the patient to let them know that their Rx was ready to be picked up. The patient's husband, Mitzi Hansen, will be picking up in the afternoon on 04/10/14. Told the patient that Dr. Olevia Perches did approve her increase in quantity from #40 to #60. I also informed her that her other medications she wanted to get refilled, Phenergan, 25 mg, and Prilosec, 40 mg, had been sent directly to the [facility] [place] in [place], [place].

## 2014-04-14 ENCOUNTER — Telehealth: Payer: Self-pay | Admitting: *Deleted

## 2014-04-14 NOTE — Telephone Encounter (Signed)
Per Dr Baxter Flattery faxed the culture results and office note to [facility].

## 2014-04-23 ENCOUNTER — Ambulatory Visit (AMBULATORY_SURGERY_CENTER): Payer: Self-pay | Admitting: *Deleted

## 2014-04-23 VITALS — Ht 68.0 in | Wt 167.0 lb

## 2014-04-23 DIAGNOSIS — K50919 Crohn's disease, unspecified, with unspecified complications: Secondary | ICD-10-CM

## 2014-04-23 NOTE — Progress Notes (Signed)
No egg or soy allergy. Pt gets very nauseated with sedation. Pt had versed and fentanyl on last colonoscopy but with EGD in '14 pt had no issues.  No home O2.  No diet meds.

## 2014-05-07 ENCOUNTER — Encounter: Payer: Self-pay | Admitting: Internal Medicine

## 2014-05-07 ENCOUNTER — Ambulatory Visit (AMBULATORY_SURGERY_CENTER): Payer: 59 | Admitting: Internal Medicine

## 2014-05-07 VITALS — BP 147/97 | HR 74 | Temp 98.4°F | Resp 20 | Ht 68.0 in | Wt 167.0 lb

## 2014-05-07 DIAGNOSIS — Z86010 Personal history of colonic polyps: Secondary | ICD-10-CM

## 2014-05-07 DIAGNOSIS — D122 Benign neoplasm of ascending colon: Secondary | ICD-10-CM

## 2014-05-07 DIAGNOSIS — K50919 Crohn's disease, unspecified, with unspecified complications: Secondary | ICD-10-CM | POA: Diagnosis not present

## 2014-05-07 DIAGNOSIS — K529 Noninfective gastroenteritis and colitis, unspecified: Secondary | ICD-10-CM | POA: Diagnosis not present

## 2014-05-07 MED ORDER — CIPROFLOXACIN HCL 250 MG PO TABS: 250.0000 mg | ORAL_TABLET | Freq: Two times a day (BID) | ORAL | Status: DC

## 2014-05-07 MED ORDER — HYDROCODONE-ACETAMINOPHEN 10-325 MG PO TABS: 1.0000 | ORAL_TABLET | ORAL | Status: DC | PRN

## 2014-05-07 MED ORDER — PROMETHAZINE HCL 25 MG PO TABS: 25.0000 mg | ORAL_TABLET | Freq: Four times a day (QID) | ORAL | Status: DC | PRN

## 2014-05-07 MED ORDER — SODIUM CHLORIDE 0.9 % IV SOLN: 500.0000 mL | INTRAVENOUS | Status: DC

## 2014-05-07 NOTE — Progress Notes (Signed)
Called to room to assist during endoscopic procedure.  Patient ID and intended procedure confirmed with present staff. Received instructions for my participation in the procedure from the performing physician.  

## 2014-05-07 NOTE — Op Note (Signed)
[facility]  [address] [place], [id number]   COLONOSCOPY PROCEDURE REPORT  PATIENT: [name], [name]  MR#: [id number] BIRTHDATE: 01-10-1961 , 53  yrs. old GENDER: female ENDOSCOPIST: Lafayette Dragon, MD REFERRED BY:Dr [name] PROCEDURE DATE:  05/07/2014 PROCEDURE:   Colonoscopy with snare polypectomy and Colonoscopy with biopsy First Screening Colonoscopy - Avg.  risk and is 50 yrs.  old or older - No.  Prior Negative Screening - Now for repeat screening. N/A  History of Adenoma - Now for follow-up colonoscopy & has been > or = to 3 yrs.  Yes hx of adenoma.  Has been 3 or more years since last colonoscopy.  Polyps Removed Today? Yes. ASA CLASS:   Class II INDICATIONS:Crohn's disease.  Prior colonoscopy in March 2011, terminal ileal resection in July 2012.  Last colonoscopy November 2012 showed 5 polyps 2 of them had low-grade dysplasia, chronic diarrhea.  Chronic abdominal pain.  She has been on biologicals. MEDICATIONS: Monitored anesthesia care and Propofol 400 mg IV  DESCRIPTION OF PROCEDURE:   After the risks benefits and alternatives of the procedure were thoroughly explained, informed consent was obtained.  The digital rectal exam revealed no abnormalities of the rectum.   The [id number]  endoscope was introduced through the anus and advanced to the ileum. No adverse events experienced.   The quality of the prep was good, using MoviPrep  The instrument was then slowly withdrawn as the colon was fully examined.      COLON FINDINGS: A polypoid shaped sessile polyp measuring 10 mm in size was found in the ascending colon.  A polypectomy was performed using snare cautery.  The resection was complete, the polyp tissue was completely retrieved and sent to histology.   There was evidence of a prior end-to-end ileocolonic surgical anastomosis. Multiple biopsies were performed using cold forceps. terminal ileum was entered without  difficulty. The anastomosis was slightly narrowed. Mucosa of the terminal ileum appeared normal. Biopsies were obtained  A diffuse patch of abnormal mucosa was found throughout the entire examined colon.  The mucosa was erythematous. Multiple random biopsies were performed using cold forceps. Sample was obtained and sent to histology.  Retroflexed views revealed no abnormalities. The time to cecum=6 minutes 48 seconds. Withdrawal time=9 minutes 56 seconds.  The scope was withdrawn and the procedure completed. COMPLICATIONS: There were no immediate complications.  ENDOSCOPIC IMPRESSION: 1.   Sessile polyp was found in the ascending colon; polypectomy was performed using snare cautery 2.   There was evidence of a prior ileocolonic surgical anastomosis; multiple biopsies were performed from terminal ileum and from the anastomosis using cold forceps 3.   Diffuse abnormal mucosa was found throughout the entire examined colon; The mucosa was erythematous; multiple random biopsies were performed using cold forceps ,no aphthous ulcers pseudopolyps  RECOMMENDATIONS: 1.  Await biopsy results 2.  Await pathology results 3.  Continue current medications 4.  Recall colonoscopy pending path report  eSigned:  Lafayette Dragon, MD 05/07/2014 9:43 AM   cc:

## 2014-05-07 NOTE — Patient Instructions (Signed)
YOU HAD AN ENDOSCOPIC PROCEDURE TODAY AT [facility]:   Refer to the procedure report that was given to you for any specific questions about what was found during the examination.  If the procedure report does not answer your questions, please call your gastroenterologist to clarify.  If you requested that your care partner not be given the details of your procedure findings, then the procedure report has been included in a sealed envelope for you to review at your convenience later.  YOU SHOULD EXPECT: Some feelings of bloating in the abdomen. Passage of more gas than usual.  Walking can help get rid of the air that was put into your GI tract during the procedure and reduce the bloating. If you had a lower endoscopy (such as a colonoscopy or flexible sigmoidoscopy) you may notice spotting of blood in your stool or on the toilet paper. If you underwent a bowel prep for your procedure, you may not have a normal bowel movement for a few days.  Please Note:  You might notice some irritation and congestion in your nose or some drainage.  This is from the oxygen used during your procedure.  There is no need for concern and it should clear up in a day or so.  SYMPTOMS TO REPORT IMMEDIATELY:   Following lower endoscopy (colonoscopy or flexible sigmoidoscopy):  Excessive amounts of blood in the stool  Significant tenderness or worsening of abdominal pains  Swelling of the abdomen that is new, acute  Fever of 100F or higher   A gastroenterologist can be reached at any hour by calling [phone].   DIET: Your first meal following the procedure should be a small meal and then it is ok to progress to your normal diet. Heavy or fried foods are harder to digest and may make you feel nauseous or bloated.  Likewise, meals heavy in dairy and vegetables can increase bloating.  Drink plenty of fluids but you should avoid alcoholic beverages for 24 hours.  ACTIVITY:  You should plan to take it  easy for the rest of today and you should NOT DRIVE or use heavy machinery until tomorrow (because of the sedation medicines used during the test).    FOLLOW UP: Our staff will call the number listed on your records the next business day following your procedure to check on you and address any questions or concerns that you may have regarding the information given to you following your procedure. If we do not reach you, we will leave a message.  However, if you are feeling well and you are not experiencing any problems, there is no need to return our call.  We will assume that you have returned to your regular daily activities without incident.  If any biopsies were taken you will be contacted by phone or by letter within the next 1-3 weeks.  Please call us at [phone] if you have not heard about the biopsies in 3 weeks.    SIGNATURES/CONFIDENTIALITY: You and/or your care partner have signed paperwork which will be entered into your electronic medical record.  These signatures attest to the fact that that the information above on your After Visit Summary has been reviewed and is understood.  Full responsibility of the confidentiality of this discharge information lies with you and/or your care-partner.  Wait pathology.

## 2014-05-07 NOTE — Progress Notes (Signed)
Patient awakening,vss,report to rn 

## 2014-05-08 ENCOUNTER — Telehealth: Payer: Self-pay

## 2014-05-08 ENCOUNTER — Telehealth: Payer: Self-pay | Admitting: *Deleted

## 2014-05-08 NOTE — Telephone Encounter (Signed)
  Follow up Call-  Call back number 05/07/2014 01/22/2013  Post procedure Call Back phone  # [phone] OK TO SPEAK WITH HUSBAND. [phone]  Permission to leave phone message Yes Yes     Patient questions:  Do you have a fever, pain , or abdominal swelling? No. Pain Score  0 *  Have you tolerated food without any problems? Yes.    Have you been able to return to your normal activities? Yes.    Do you have any questions about your discharge instructions: Diet   No. Medications  No. Follow up visit  No.  Do you have questions or concerns about your Care? No.  Actions: * If pain score is 4 or above: No action needed, pain <4.

## 2014-05-08 NOTE — Telephone Encounter (Signed)
Husband states she did take the phenergan and she has eaten some grilled chicken and has been able to drink and no vomiting. He states she is having right pain under her rib but this is the same pain she had prior to having her procedure yesterday. He states she was told by Dr Olevia Perches to schedule an OV so he was given the number to call and get this scheduled.  He states she seems to be feeling better, vertigo with some improvement  so instructed him to continue small amts of liquids frequently and foods as tolerated, phenergan as directed as needed and to call if she has change in s/s, bleeding or any changes occur.  Mitzi Hansen ,husband,  verbalized understanding of these instructions   Lenard Galloway, RN

## 2014-05-08 NOTE — Telephone Encounter (Signed)
May be it is the Bentyl? I told her to restart it, but she was on it before, so I don't know. DB Agree with Dr Hilarie Fredrickson

## 2014-05-08 NOTE — Telephone Encounter (Signed)
Difficult to know etiology of symptoms. Nausea worsening with moving head could be vertigo type symptoms If having abd pain, would recommend 2v abd xray Can rx. zofran 4-79m every 6-8h prn for nausea, no more than 24 mcg per 24 hrs If symptoms persist she needs to contact PCP with vertigo type symptoms.  Will cc: Dr. BOlevia Perches

## 2014-05-08 NOTE — Telephone Encounter (Signed)
Spoke with [name], husband and he states she has phenergan at home so he doesn't feel like we need to call in the zofran. She has crohn's disease that's why she has the phenergan. He isn't sure if she has tried a phenergan this am but if not he wil;l have her take one.  Instructed pt's husband if she continues to have abd pain that's different than before , call so Dr Hilarie Fredrickson can order the xray for her.   She has vomited a small amount once only, she has been drinking, and wants to try some bland foods.  Told [name] to continue to monitor her and call if any changes.   Instructed him she needs to contact her PCP if the s/s continue as vertigo symptoms. [name] husband verbalized understanding of these instructions given.  Lenard Galloway RN

## 2014-05-08 NOTE — Telephone Encounter (Signed)
Husband calling for wife- pt of Dr Olevia Perches. Had a colon yesterday. Woke up this am "exrtremely nauseated" per [name], husband. temo 98.1. Passed most of her gas last pm and states this colon actually went better than most, anesthesia better. States that if she moves her head at all she has dry heaves and feels like she is going to vomit. Burping. She did eat last pm between 08-10-28 pm with no issues . No bleeding noted and not acyually vomited but very nauseated. She did at 0955 take a dramamine because its like her equilibrium is off per the husband . She came in for right UQ pain and this is unchanged, not worse and no different pain.  Please advise  Thanks, Leisure centre manager

## 2014-05-08 NOTE — Telephone Encounter (Signed)
1124 am called with instructions per Dr Hilarie Fredrickson and there was no answer. [facility] at [phone].  Lelan Pons PV

## 2014-05-12 ENCOUNTER — Encounter: Payer: Self-pay | Admitting: Internal Medicine

## 2014-05-15 ENCOUNTER — Encounter: Payer: Self-pay | Admitting: Internal Medicine

## 2014-06-05 ENCOUNTER — Encounter: Payer: Self-pay | Admitting: Internal Medicine

## 2014-06-05 ENCOUNTER — Other Ambulatory Visit: Payer: Self-pay | Admitting: Internal Medicine

## 2014-06-05 ENCOUNTER — Telehealth: Payer: Self-pay | Admitting: *Deleted

## 2014-06-05 DIAGNOSIS — K50919 Crohn's disease, unspecified, with unspecified complications: Secondary | ICD-10-CM

## 2014-06-05 MED ORDER — PROMETHAZINE HCL 25 MG PO TABS: 25.0000 mg | ORAL_TABLET | Freq: Four times a day (QID) | ORAL | Status: DC | PRN

## 2014-06-05 MED ORDER — HYDROCODONE-ACETAMINOPHEN 10-325 MG PO TABS: 1.0000 | ORAL_TABLET | ORAL | Status: DC | PRN

## 2014-06-05 NOTE — Telephone Encounter (Signed)
Sent Rx for promethazine (PHENERGAN), 25 mg, #30 with no refills to [facility] in [place]. Faxed over Rx for hydrocodone-acetaminophen [facility]), 10-325 mg, #60 with no refills on 06/05/14 to [facility] in [place].

## 2014-06-06 ENCOUNTER — Telehealth: Payer: Self-pay | Admitting: *Deleted

## 2014-06-06 NOTE — Telephone Encounter (Signed)
Entry error

## 2014-06-06 NOTE — Telephone Encounter (Signed)
Called patient on their cell phone [phone]) on 06/06/14 at 10:40 am to let them know their Rx for hydrocodone-acetaminophen [facility]), 10-325 mg, #60 with no refills was ready to be picked up.

## 2014-07-04 ENCOUNTER — Ambulatory Visit (INDEPENDENT_AMBULATORY_CARE_PROVIDER_SITE_OTHER): Payer: 59 | Admitting: Internal Medicine

## 2014-07-04 ENCOUNTER — Encounter: Payer: Self-pay | Admitting: Internal Medicine

## 2014-07-04 ENCOUNTER — Other Ambulatory Visit (INDEPENDENT_AMBULATORY_CARE_PROVIDER_SITE_OTHER): Payer: 59

## 2014-07-04 VITALS — BP 138/70 | HR 88 | Ht 67.5 in | Wt 160.4 lb

## 2014-07-04 DIAGNOSIS — K5010 Crohn's disease of large intestine without complications: Secondary | ICD-10-CM

## 2014-07-04 DIAGNOSIS — B372 Candidiasis of skin and nail: Secondary | ICD-10-CM | POA: Diagnosis not present

## 2014-07-04 DIAGNOSIS — E876 Hypokalemia: Secondary | ICD-10-CM | POA: Diagnosis not present

## 2014-07-04 DIAGNOSIS — D849 Immunodeficiency, unspecified: Secondary | ICD-10-CM

## 2014-07-04 DIAGNOSIS — K50919 Crohn's disease, unspecified, with unspecified complications: Secondary | ICD-10-CM

## 2014-07-04 DIAGNOSIS — D899 Disorder involving the immune mechanism, unspecified: Secondary | ICD-10-CM

## 2014-07-04 LAB — CBC WITH DIFFERENTIAL/PLATELET
Basophils Absolute: 0.1 10*3/uL (ref 0.0–0.1)
Basophils Relative: 0.8 % (ref 0.0–3.0)
EOS PCT: 1.8 % (ref 0.0–5.0)
Eosinophils Absolute: 0.2 10*3/uL (ref 0.0–0.7)
HCT: 40.8 % (ref 36.0–46.0)
Hemoglobin: 13.8 g/dL (ref 12.0–15.0)
Lymphocytes Relative: 35.8 % (ref 12.0–46.0)
Lymphs Abs: 3.1 10*3/uL (ref 0.7–4.0)
MCHC: 33.9 g/dL (ref 30.0–36.0)
MCV: 89.7 fl (ref 78.0–100.0)
MONO ABS: 0.6 10*3/uL (ref 0.1–1.0)
MONOS PCT: 7.2 % (ref 3.0–12.0)
NEUTROS ABS: 4.8 10*3/uL (ref 1.4–7.7)
NEUTROS PCT: 54.4 % (ref 43.0–77.0)
Platelets: 269 10*3/uL (ref 150.0–400.0)
RBC: 4.55 Mil/uL (ref 3.87–5.11)
RDW: 13.1 % (ref 11.5–15.5)
WBC: 8.8 10*3/uL (ref 4.0–10.5)

## 2014-07-04 LAB — COMPREHENSIVE METABOLIC PANEL
ALT: 10 U/L (ref 0–35)
AST: 14 U/L (ref 0–37)
Albumin: 4.3 g/dL (ref 3.5–5.2)
Alkaline Phosphatase: 111 U/L (ref 39–117)
BUN: 9 mg/dL (ref 6–23)
CO2: 30 mEq/L (ref 19–32)
Calcium: 9.9 mg/dL (ref 8.4–10.5)
Chloride: 102 mEq/L (ref 96–112)
Creatinine, Ser: 0.73 mg/dL (ref 0.40–1.20)
GFR: 87.97 mL/min (ref >60.00)
Glucose, Bld: 108 mg/dL — ABNORMAL HIGH (ref 70–99)
Potassium: 3.5 mEq/L (ref 3.5–5.1)
Sodium: 138 mEq/L (ref 135–145)
Total Bilirubin: 0.4 mg/dL (ref 0.2–1.2)
Total Protein: 7.8 g/dL (ref 6.0–8.3)

## 2014-07-04 LAB — SEDIMENTATION RATE: Sed Rate: 52 mm/hr — ABNORMAL HIGH (ref 0–22)

## 2014-07-04 LAB — LACTATE DEHYDROGENASE: LDH: 161 U/L (ref 94–250)

## 2014-07-04 MED ORDER — ECONAZOLE NITRATE 1 % EX CREA: TOPICAL_CREAM | Freq: Every day | CUTANEOUS | Status: DC

## 2014-07-04 MED ORDER — RANITIDINE HCL 150 MG PO TABS: 150.0000 mg | ORAL_TABLET | Freq: Every day | ORAL | Status: DC

## 2014-07-04 MED ORDER — AZATHIOPRINE 50 MG PO TABS: 100.0000 mg | ORAL_TABLET | ORAL | Status: DC

## 2014-07-04 MED ORDER — CYCLOBENZAPRINE HCL 10 MG PO TABS: 10.0000 mg | ORAL_TABLET | Freq: Every evening | ORAL | Status: DC | PRN

## 2014-07-04 MED ORDER — ALPRAZOLAM 0.5 MG PO TABS: 0.5000 mg | ORAL_TABLET | Freq: Two times a day (BID) | ORAL | Status: DC

## 2014-07-04 MED ORDER — POTASSIUM CHLORIDE ER 8 MEQ PO TBCR: 8.0000 meq | EXTENDED_RELEASE_TABLET | Freq: Every day | ORAL | Status: DC

## 2014-07-04 MED ORDER — LOPERAMIDE HCL 2 MG PO CAPS: 4.0000 mg | ORAL_CAPSULE | Freq: Two times a day (BID) | ORAL | Status: DC

## 2014-07-04 MED ORDER — FLUCONAZOLE 200 MG PO TABS: 200.0000 mg | ORAL_TABLET | Freq: Every day | ORAL | Status: DC

## 2014-07-04 MED ORDER — HYDROCODONE-ACETAMINOPHEN 10-325 MG PO TABS: 1.0000 | ORAL_TABLET | ORAL | Status: DC | PRN

## 2014-07-04 MED ORDER — DIPHENOXYLATE-ATROPINE 2.5-0.025 MG PO TABS: ORAL_TABLET | ORAL | Status: DC

## 2014-07-04 MED ORDER — PROMETHAZINE HCL 25 MG RE SUPP: 25.0000 mg | Freq: Four times a day (QID) | RECTAL | Status: DC | PRN

## 2014-07-04 MED ORDER — PRAMOXINE-HC 1-1 % EX CREA: 1.0000 "application " | TOPICAL_CREAM | Freq: Four times a day (QID) | CUTANEOUS | Status: DC

## 2014-07-04 MED ORDER — OMEPRAZOLE 20 MG PO CPDR: 20.0000 mg | DELAYED_RELEASE_CAPSULE | Freq: Two times a day (BID) | ORAL | Status: DC

## 2014-07-04 MED ORDER — CLOTRIMAZOLE 1 % EX OINT: 1.0000 "application " | TOPICAL_OINTMENT | Freq: Two times a day (BID) | CUTANEOUS | Status: DC

## 2014-07-04 NOTE — Progress Notes (Signed)
[name] 1961-02-09 [id number]  Note: This dictation was prepared with Dragon digital system. Any transcriptional errors that result from this procedure are unintentional.   History of Present Illness: This is a 54 year old white female with Crohn's disease of the small bowel as well as of the colon. On recent colonoscopy in March 2016, she had active focal colitis as well as ileitis and a tubular adenoma. She comes today with the continued weight loss of 8 pounds since last appointment in December 2015. She also is complaining of dysphagia to liquids as well as solids despite of taking omeprazole 40 mg daily. She reports pill dysphagia to [facility] and Prilosec 40 mg.. She has a history of esophageal stricture which was dilated in November 2014. Patient has chronic diarrhea and nausea. She was in the past on Remicade as well as on  Humira which were both  discontinued.since the psoriasis flared up and because of questionable benefit. She is currently on Entocort 9 mg daily, Imuran 100 mg daily and prednisone 10 mg daily. Patient underwent resection of the terminal ileum in 2008 and again in July 2012. She has been exposed to steroids long-term.    Past Medical History  Diagnosis Date  . Crohn's disease   . Depression   . GERD (gastroesophageal reflux disease)   . Vitamin B12 deficiency   . Psoriasis     SKIN  . Family history of malignant neoplasm of gastrointestinal tract   . Crohn's colitis   . PONV (postoperative nausea and vomiting)   . Hypertension   . H/O hiatal hernia   . Seasonal asthma   . S/P dilatation of esophageal stricture   . History of adenomatous polyp of colon   . History of small bowel obstruction     SECONDARY CROHN'S STRICTURE  S/P COLECTOMY  . History of gastritis     AND HX ILEITIS  . Chronic diarrhea   . Internal hemorrhoids   . History of kidney stones   . Right ureteral stone   . Hypopotassemia   . Chronic disease anemia   . Urgency of urination    . Arthritis   . Hyperlipidemia   . Rotavirus infection 05/31/2013  . Kidney stones     Past Surgical History  Procedure Laterality Date  . Carpal tunnel release Right 2000  . Anal fissure repair  1990's  . Cystoscopy with retrograde pyelogram, ureteroscopy and stent placement Left 03/08/2013    Procedure: CYSTOSCOPY WITH RETROGRADE PYELOGRAM, URETEROSCOPY AND STENT PLACEMENT stone basketing;  Surgeon: Alexis Frock, MD;  Location: [facility];  Service: Urology;  Laterality: Left;  . Vaginal hysterectomy  1992  . Cholecystectomy  2002  . Laparoscopic ileocecectomy  10-09-2008    AND APPENDECTOMY (TERMINAL ILEITIS & PARTIAL SBO)  . Dx laparoscopy converted to open right colecctomy  09-15-2010    ANASTOMOTIC STRICTURE  . Cystoscopy with retrograde pyelogram, ureteroscopy and stent placement Right 04/03/2013    Procedure: [place], URETEROSCOPY AND STENT PLACEMENT;  Surgeon: Alexis Frock, MD;  Location: [facility];  Service: Urology;  Laterality: Right;    Allergies  Allergen Reactions  . Codeine Hives and Nausea And Vomiting  . Humira [Adalimumab] Rash    Family history and social history have been reviewed.  Review of Systems: Abdominal tenderness. Nausea. Occasional vomiting. Weight loss. Joint pains, psoriasis  The remainder of the 10 point ROS is negative except as outlined in the H&P  Physical Exam: General Appearance thin chronically ill-appearing,  in no distress Eyes  Non icteric  HEENT  Non traumatic, normocephalic  Mouth No lesion, tongue decreased papulation  Cheilosis bilaterally Neck Supple without adenopathy, thyroid not enlarged, no carotid bruits, no JVD Lungs Clear to auscultation bilaterally COR Normal S1, normal S2, regular rhythm, no murmur, quiet precordium Abdomen scaphoid. Soft but diffusely tender. Well-healed surgical scar. No distention or tympany Rectal not done Extremities  No pedal edema Skin psoriatic  lesions knees, abdomen and under the breast Neurological Alert and oriented x 3 Psychological Normal mood and affect  Assessment and Plan:   Severe Crohn's disease of the small and large bowel. Status post 2 previous terminal ileal resection's. Steroid dependent and immunosuppressed. I'll continue Entocort and low-dose prednisone and will precipitate 54 Entyvia including induction regimen with maintenance dose.I have discussed with pt and her husband potential side effects of Entyvio including exacerbation of psoriasis.  Dysphagia with history of esophageal stricture. Pill dysphagia. We will resume Diflucan 200 mg daily and also schedule for upper endoscopy for dilatation. We will continue Prilosec 40 mg in the morning and ranitidine 150 mg at bedtime, refill on Phenergan Imodium and hydrocodone  Chronic diarrhea. Renewal Analpram cream. Today CBC, metabolic panel, sedimentation rate. She will be due for repeat TB test in August 2016. Next time check urine for heavy metals.    [name] 07/04/2014

## 2014-07-04 NOTE — Patient Instructions (Addendum)
Dr [name]  Your prescriptions will be sent to your pharmacy  Go to the basement for labs today  You have been scheduled for an endoscopy. Please follow written instructions given to you at your visit today. If you use inhalers (even only as needed), please bring them with you on the day of your procedure. Your physician has requested that you go to www.startemmi.com and enter the access code given to you at your visit today. This web site gives a general overview about your procedure. However, you should still follow specific instructions given to you by our office regarding your preparation for the procedure.  Leone Payor , RN will be contacting you about starting [facility]

## 2014-07-07 ENCOUNTER — Telehealth: Payer: Self-pay | Admitting: Internal Medicine

## 2014-07-08 ENCOUNTER — Other Ambulatory Visit: Payer: Self-pay | Admitting: *Deleted

## 2014-07-08 ENCOUNTER — Telehealth: Payer: Self-pay | Admitting: Internal Medicine

## 2014-07-08 ENCOUNTER — Telehealth: Payer: Self-pay | Admitting: *Deleted

## 2014-07-08 DIAGNOSIS — K50919 Crohn's disease, unspecified, with unspecified complications: Secondary | ICD-10-CM

## 2014-07-08 DIAGNOSIS — K5010 Crohn's disease of large intestine without complications: Secondary | ICD-10-CM

## 2014-07-08 MED ORDER — DIPHENOXYLATE-ATROPINE 2.5-0.025 MG PO TABS: ORAL_TABLET | ORAL | Status: DC

## 2014-07-08 MED ORDER — HYDROCODONE-ACETAMINOPHEN 10-325 MG PO TABS: 1.0000 | ORAL_TABLET | ORAL | Status: DC | PRN

## 2014-07-08 MED ORDER — PROMETHAZINE HCL 25 MG PO TABS: 25.0000 mg | ORAL_TABLET | Freq: Four times a day (QID) | ORAL | Status: DC | PRN

## 2014-07-08 MED ORDER — BUDESONIDE 3 MG PO CPEP: 3.0000 mg | ORAL_CAPSULE | Freq: Every day | ORAL | Status: DC

## 2014-07-08 MED ORDER — ALPRAZOLAM 0.5 MG PO TABS: 0.5000 mg | ORAL_TABLET | Freq: Two times a day (BID) | ORAL | Status: DC

## 2014-07-08 NOTE — Telephone Encounter (Signed)
Per Dr. Nichola Sizer approval, patient is allowed to switch from clotrimazole ointment to clotrimazole cream. Called pharmacy to let them know.

## 2014-07-08 NOTE — Telephone Encounter (Signed)
Patient states [facility] would not fill her Xanax, Lomotil or Norco because the prescriptions were stamped. Called [facility]

## 2014-07-08 NOTE — Telephone Encounter (Signed)
OK to refill Entecort.

## 2014-07-08 NOTE — Telephone Encounter (Signed)
Patient states her Entocort was not refilled either. Should she stay on Entocort 9 mg daily?

## 2014-07-09 MED ORDER — BUDESONIDE 3 MG PO CPEP: 9.0000 mg | ORAL_CAPSULE | Freq: Every day | ORAL | Status: DC

## 2014-07-09 NOTE — Telephone Encounter (Signed)
Rx refilled.

## 2014-07-10 ENCOUNTER — Encounter: Payer: Self-pay | Admitting: Internal Medicine

## 2014-07-16 ENCOUNTER — Telehealth: Payer: Self-pay | Admitting: *Deleted

## 2014-07-16 NOTE — Telephone Encounter (Signed)
Spoke with Coralyn Mark and she will send PA form via fax. Should arrive within 24 hours.

## 2014-07-21 NOTE — Telephone Encounter (Signed)
Received a call from [facility] that patient's copay for Entyvio is $25. (Amy). Called them and told them patient will be receiving it at [facility] short stay and does not need drug shipped.

## 2014-07-22 ENCOUNTER — Telehealth: Payer: Self-pay | Admitting: *Deleted

## 2014-07-22 ENCOUNTER — Other Ambulatory Visit: Payer: Self-pay | Admitting: *Deleted

## 2014-07-22 DIAGNOSIS — K50119 Crohn's disease of large intestine with unspecified complications: Secondary | ICD-10-CM

## 2014-07-22 NOTE — Telephone Encounter (Signed)
Entyvio scheduled at [facility] short stay on 07/30/14 at 7:00 AM. Spoke with patient and she cannot do this date.

## 2014-07-22 NOTE — Telephone Encounter (Signed)
Spoke with Dr. Olevia Perches and patient can be scheduled for Entyvio at [facility] short stay. Called short stay and scheduled patient on 08/30/14 at 7:00 AM. Orders in EPIC.

## 2014-07-23 ENCOUNTER — Other Ambulatory Visit: Payer: Self-pay | Admitting: *Deleted

## 2014-07-23 ENCOUNTER — Telehealth: Payer: Self-pay | Admitting: *Deleted

## 2014-07-23 DIAGNOSIS — K50119 Crohn's disease of large intestine with unspecified complications: Secondary | ICD-10-CM

## 2014-07-23 NOTE — Telephone Encounter (Signed)
Spoke with Marcie Bal at [facility] short stay and scheduled Entyvio induction for patient on 08/18/14, 09/01/14 and 09/29/14 all at 1:00 PM. Left a message for patient to call back.

## 2014-07-23 NOTE — Telephone Encounter (Signed)
See phone note on 07/23/14

## 2014-07-23 NOTE — Telephone Encounter (Signed)
Patient given date for Entyvio infusions.

## 2014-07-23 NOTE — Telephone Encounter (Signed)
Janae Bridgeman, RN  Hulan Saas, RN           I went ahead and scheduled her 8 week appointment after the 09/29/14 appointment and that will be 11/24/14  Marcie Bal

## 2014-07-30 ENCOUNTER — Inpatient Hospital Stay (HOSPITAL_COMMUNITY): Admission: RE | Admit: 2014-07-30 | Payer: Medicare Other | Source: Ambulatory Visit

## 2014-07-30 ENCOUNTER — Encounter: Payer: Self-pay | Admitting: Internal Medicine

## 2014-07-30 ENCOUNTER — Ambulatory Visit (AMBULATORY_SURGERY_CENTER): Payer: 59 | Admitting: Internal Medicine

## 2014-07-30 VITALS — BP 145/90 | HR 70 | Temp 97.5°F | Resp 23 | Ht 67.0 in | Wt 160.0 lb

## 2014-07-30 DIAGNOSIS — K2970 Gastritis, unspecified, without bleeding: Secondary | ICD-10-CM

## 2014-07-30 DIAGNOSIS — K209 Esophagitis, unspecified without bleeding: Secondary | ICD-10-CM

## 2014-07-30 DIAGNOSIS — R1310 Dysphagia, unspecified: Secondary | ICD-10-CM

## 2014-07-30 DIAGNOSIS — K2950 Unspecified chronic gastritis without bleeding: Secondary | ICD-10-CM | POA: Diagnosis not present

## 2014-07-30 DIAGNOSIS — K2990 Gastroduodenitis, unspecified, without bleeding: Secondary | ICD-10-CM

## 2014-07-30 MED ORDER — RANITIDINE HCL 150 MG PO TABS: 300.0000 mg | ORAL_TABLET | Freq: Every day | ORAL | Status: DC

## 2014-07-30 MED ORDER — SODIUM CHLORIDE 0.9 % IV SOLN: 500.0000 mL | INTRAVENOUS | Status: DC

## 2014-07-30 MED ORDER — PROMETHAZINE HCL 25 MG/ML IJ SOLN
25.0000 mg | Freq: Once | INTRAMUSCULAR | Status: DC
  Administered 2014-07-30: 25 mg via INTRAVENOUS

## 2014-07-30 NOTE — Progress Notes (Addendum)
Late entry- Pt arrived to the RR at 1039. She is very nauseated and states that Zofran doesn't work for her.  Phenergan 25 mg IV given, diluted in 10 mL of Normal Saline, given at 1049 over 10 minutes.  Verified doseage with D. Merritt CRNA  Before discharge, pt states she feels "much better."  No further c/o nausea

## 2014-07-30 NOTE — Op Note (Signed)
[facility]  [address] [place], [address]   ENDOSCOPY PROCEDURE REPORT  PATIENT: Jessica, [name]  MR#: [id number] BIRTHDATE: 1960/07/09 , 54  yrs. old GENDER: female ENDOSCOPIST: Lafayette Dragon, MD REFERRED BY:  Dr Justice Rocher PROCEDURE DATE:  07/30/2014 PROCEDURE:  EGD w/ biopsy and Maloney dilation of esophagus ASA CLASS:     Class III INDICATIONS:  dysphagia, heartburn, nausea, vomiting, and History of severe Crohn's disease.  On multiple medications.  Last endoscopy in November 2014 showed stricture which was dilated.  She has been on Prilosec and ranitidine.  As well as Diflucan.  She is experiencing dysphagia mostly to solids. MEDICATIONS: Monitored anesthesia care and Propofol 200 mg IV TOPICAL ANESTHETIC: none  DESCRIPTION OF PROCEDURE: After the risks benefits and alternatives of the procedure were thoroughly explained, informed consent was obtained.  The LB [id number] endoscope was introduced through the mouth and advanced to the second portion of the duodenum , Without limitations.  The instrument was slowly withdrawn as the mucosa was fully examined.    Esophagus: there was acute esophagitis at GE junction consisting of 1 short erosion which measured less than 5 mm in diameter. There was spasm at the level of the lower esophageal sphincter but there was no definite stricture.  Esophageal mucosa appeared normal throughoutStomach: gastric mucosa was diffusely edematous and erythematous. There was slight nodularity. These findings are consistent with diffuse gastritis. Multiple biopsies were obtained action of endoscope revealed normal fundus and cardia. Gastric outlet was normal. There was small amount of bile along  Duodenum: duodenal bulb and descending duodenum was normal[ The scope was then withdrawn from the patient and the procedure completed. Maloney dilator 8 [place] passed through the esophagus with the minimal  resistance. There was no blood on the dilator  COMPLICATIONS: There were no immediate complications.  ENDOSCOPIC IMPRESSION: 1. no significant esophageal stricture. Status post passage of 18 French Maloney dilator 2.  GradeA reflux esophagitis 3. Diffuse gastritis or gastropathy. Biopsies pending  RECOMMENDATIONS: 1.  Await pathology results 2.  Increase antireflux regimen Prilosec 40 mg a.m and ranitidine 300 mg at bedtime Continue Diflucan  REPEAT EXAM: for EGD pending biopsy results.  eSigned:  Lafayette Dragon, MD 07/30/2014 10:43 AM    CC:  PATIENT NAME:  Jessica, [name] MR#: [id number]

## 2014-07-30 NOTE — Progress Notes (Signed)
Called to room to assist during endoscopic procedure.  Patient ID and intended procedure confirmed with present staff. Received instructions for my participation in the procedure from the performing physician.  

## 2014-07-30 NOTE — Progress Notes (Signed)
To Pacu awake and alert, report to RN, Pleased with MAC

## 2014-07-30 NOTE — Patient Instructions (Signed)
YOU HAD AN ENDOSCOPIC PROCEDURE TODAY AT [facility]:   Refer to the procedure report that was given to you for any specific questions about what was found during the examination.  If the procedure report does not answer your questions, please call your gastroenterologist to clarify.  If you requested that your care partner not be given the details of your procedure findings, then the procedure report has been included in a sealed envelope for you to review at your convenience later.  YOU SHOULD EXPECT: Some feelings of bloating in the abdomen. Passage of more gas than usual.  Walking can help get rid of the air that was put into your GI tract during the procedure and reduce the bloating.  Please Note:  You might notice some irritation and congestion in your nose or some drainage.  This is from the oxygen used during your procedure.  There is no need for concern and it should clear up in a day or so.  SYMPTOMS TO REPORT IMMEDIATELY:   Following upper endoscopy (EGD)  Vomiting of blood or coffee ground material  New chest pain or pain under the shoulder blades  Painful or persistently difficult swallowing  New shortness of breath  Fever of 100F or higher  Black, tarry-looking stools  For urgent or emergent issues, a gastroenterologist can be reached at any hour by calling [phone].   DIET: follow dilation diet- see handout Likewise, meals heavy in dairy and vegetables can increase bloating.  Drink plenty of fluids but you should avoid alcoholic beverages for 24 hours.  ACTIVITY:  You should plan to take it easy for the rest of today and you should NOT DRIVE or use heavy machinery until tomorrow (because of the sedation medicines used during the test).    FOLLOW UP: Our staff will call the number listed on your records the next business day following your procedure to check on you and address any questions or concerns that you may have regarding the information given to  you following your procedure. If we do not reach you, we will leave a message.  However, if you are feeling well and you are not experiencing any problems, there is no need to return our call.  We will assume that you have returned to your regular daily activities without incident.  If any biopsies were taken you will be contacted by phone or by letter within the next 1-3 weeks.  Please call us at [phone] if you have not heard about the biopsies in 3 weeks.   SIGNATURES/CONFIDENTIALITY: You and/or your care partner have signed paperwork which will be entered into your electronic medical record.  These signatures attest to the fact that that the information above on your After Visit Summary has been reviewed and is understood.  Full responsibility of the confidentiality of this discharge information lies with you and/or your care-partner.  Await pathology  Follow dilation diet- see handout  Take Prilosec 40 mg 30 minutes before breakfast and Ranitidine 300 mg at bedtime

## 2014-07-31 ENCOUNTER — Telehealth: Payer: Self-pay | Admitting: *Deleted

## 2014-07-31 NOTE — Telephone Encounter (Signed)
If she's not having pain she can continue clear liquids and Phenergan orally or by suppositories.  If not improved call back in the a.m.

## 2014-07-31 NOTE — Telephone Encounter (Signed)
  Follow up Call-  Call back number 07/30/2014 05/07/2014 01/22/2013  Post procedure Call Back phone  # [phone] OK TO SPEAK WITH HUSBAND. [phone]  Permission to leave phone message Yes Yes Yes     Patient questions:  Do you have a fever, pain , or abdominal swelling? No. Pain Score  0 *  Have you tolerated food without any problems? No.  Have you been able to return to your normal activities? No.  Do you have any questions about your discharge instructions: Diet   No. Medications  No. Follow up visit  No.  Do you have questions or concerns about your Care? Yes.    Actions: * If pain score is 4 or above: No action needed, pain <4.  Spoke with pt this morning during F/U phone call.  She was nauseated yesterday immediately after procedure and was given IV phenergan in recovery.  She states this morning that she is still vomiting up yellow liquid and has only been able to keep down a small amount of Sprite.  She has PO Phenergan as well as suppositories at home.  She has been taking them every 6 hrs but nothing has helped. She adds that Zofran "does not work for her".   Please advise.

## 2014-07-31 NOTE — Telephone Encounter (Signed)
No answer at either number.  Left a message for patient relaying Dr. Kelby Fam suggestions.  She is to call back tomorrow if she is seeing no improvement.  Advised her to call with any further questions.

## 2014-08-08 ENCOUNTER — Other Ambulatory Visit: Payer: Self-pay | Admitting: Internal Medicine

## 2014-08-08 NOTE — Telephone Encounter (Signed)
error 

## 2014-08-11 ENCOUNTER — Telehealth: Payer: Self-pay | Admitting: Internal Medicine

## 2014-08-11 ENCOUNTER — Encounter: Payer: Self-pay | Admitting: Internal Medicine

## 2014-08-11 DIAGNOSIS — K50919 Crohn's disease, unspecified, with unspecified complications: Secondary | ICD-10-CM

## 2014-08-11 MED ORDER — PROMETHAZINE HCL 25 MG PO TABS: 25.0000 mg | ORAL_TABLET | Freq: Four times a day (QID) | ORAL | Status: DC | PRN

## 2014-08-11 MED ORDER — HYDROCODONE-ACETAMINOPHEN 10-325 MG PO TABS
1.0000 | ORAL_TABLET | ORAL | Status: DC | PRN
Start: 2014-08-11 — End: 2014-09-09

## 2014-08-11 MED ORDER — PREDNISONE 10 MG PO TABS: ORAL_TABLET | ORAL | Status: DC

## 2014-08-11 NOTE — Telephone Encounter (Signed)
Please refill meds she requested.,thanx

## 2014-08-11 NOTE — Telephone Encounter (Signed)
Medications sent as requested per Dr Olevia Perches

## 2014-08-11 NOTE — Telephone Encounter (Signed)
Dr Brodie-please advise 

## 2014-08-12 ENCOUNTER — Other Ambulatory Visit: Payer: Self-pay

## 2014-08-12 DIAGNOSIS — K50919 Crohn's disease, unspecified, with unspecified complications: Secondary | ICD-10-CM

## 2014-08-12 NOTE — Telephone Encounter (Signed)
Error

## 2014-08-18 ENCOUNTER — Other Ambulatory Visit (HOSPITAL_COMMUNITY): Payer: Self-pay | Admitting: Internal Medicine

## 2014-08-18 ENCOUNTER — Encounter (HOSPITAL_COMMUNITY): Admission: RE | Admit: 2014-08-18 | Payer: 59 | Source: Ambulatory Visit

## 2014-09-01 ENCOUNTER — Encounter (HOSPITAL_COMMUNITY)
Admission: RE | Admit: 2014-09-01 | Discharge: 2014-09-01 | Disposition: A | Discharge status: Home / Self Care | Payer: 59 | Source: Ambulatory Visit | Attending: Internal Medicine | Admitting: Internal Medicine

## 2014-09-01 ENCOUNTER — Encounter (HOSPITAL_COMMUNITY): Payer: Self-pay

## 2014-09-01 VITALS — BP 154/86 | HR 68 | Temp 97.5°F | Resp 18 | Ht 67.0 in | Wt 160.5 lb

## 2014-09-01 DIAGNOSIS — K50119 Crohn's disease of large intestine with unspecified complications: Secondary | ICD-10-CM | POA: Insufficient documentation

## 2014-09-01 MED ORDER — SODIUM CHLORIDE 0.9 % IV SOLN
300.0000 mg | Freq: Once | INTRAVENOUS | Status: AC
  Administered 2014-09-01: 300 mg via INTRAVENOUS
  Filled 2014-09-01: qty 5

## 2014-09-01 MED ORDER — SODIUM CHLORIDE 0.9 % IV SOLN
Freq: Every day | INTRAVENOUS | Status: DC
  Administered 2014-09-01: 13:00:00 via INTRAVENOUS

## 2014-09-01 NOTE — Progress Notes (Signed)
Pt received first dose of Entyvio today.  Pt tolerated well.  Vss, afebrile.  Pt d/c ambulatory to lobby.

## 2014-09-09 ENCOUNTER — Other Ambulatory Visit: Payer: Self-pay | Admitting: Internal Medicine

## 2014-09-09 ENCOUNTER — Other Ambulatory Visit: Payer: Self-pay | Admitting: *Deleted

## 2014-09-09 ENCOUNTER — Telehealth: Payer: Self-pay | Admitting: Internal Medicine

## 2014-09-09 DIAGNOSIS — K50919 Crohn's disease, unspecified, with unspecified complications: Secondary | ICD-10-CM

## 2014-09-09 MED ORDER — PROMETHAZINE HCL 25 MG PO TABS: 25.0000 mg | ORAL_TABLET | Freq: Four times a day (QID) | ORAL | Status: DC | PRN

## 2014-09-09 MED ORDER — HYDROCODONE-ACETAMINOPHEN 10-325 MG PO TABS: 1.0000 | ORAL_TABLET | ORAL | Status: DC | PRN

## 2014-09-09 MED ORDER — BUDESONIDE 3 MG PO CPEP: 9.0000 mg | ORAL_CAPSULE | Freq: Every day | ORAL | Status: DC

## 2014-09-10 ENCOUNTER — Encounter: Payer: Self-pay | Admitting: Internal Medicine

## 2014-09-10 MED ORDER — HYDROCODONE-ACETAMINOPHEN 10-325 MG PO TABS
1.0000 | ORAL_TABLET | ORAL | Status: DC | PRN
Start: 2014-09-10 — End: 2014-10-09

## 2014-09-10 NOTE — Addendum Note (Signed)
Addended by: Barron Alvine on: 09/10/2014 09:28 AM   Modules accepted: Orders

## 2014-09-10 NOTE — Telephone Encounter (Signed)
Prescription has been printed and put on Dr Diona Browner desk for signature pt will pick up when ready

## 2014-09-11 ENCOUNTER — Telehealth: Payer: Self-pay | Admitting: *Deleted

## 2014-09-11 ENCOUNTER — Other Ambulatory Visit: Payer: 59

## 2014-09-11 DIAGNOSIS — K50919 Crohn's disease, unspecified, with unspecified complications: Secondary | ICD-10-CM

## 2014-09-11 NOTE — Telephone Encounter (Signed)
Patient notified that TB testing is due. She will come for labs. Lab in EPIC.

## 2014-09-14 LAB — QUANTIFERON TB GOLD ASSAY (BLOOD)
INTERFERON GAMMA RELEASE ASSAY: NEGATIVE
Quantiferon Nil Value: 0.1 IU/mL
Quantiferon Tb Ag Minus Nil Value: 0 IU/mL
TB Ag value: 0.09 IU/mL

## 2014-09-15 ENCOUNTER — Encounter (HOSPITAL_COMMUNITY): Payer: Self-pay

## 2014-09-15 ENCOUNTER — Encounter (HOSPITAL_COMMUNITY)
Admission: RE | Admit: 2014-09-15 | Discharge: 2014-09-15 | Disposition: A | Discharge status: Home / Self Care | Payer: 59 | Source: Ambulatory Visit | Attending: Internal Medicine | Admitting: Internal Medicine

## 2014-09-15 VITALS — BP 182/96 | HR 71 | Temp 98.1°F | Resp 20 | Ht 67.0 in | Wt 162.5 lb

## 2014-09-15 DIAGNOSIS — K50119 Crohn's disease of large intestine with unspecified complications: Secondary | ICD-10-CM | POA: Diagnosis present

## 2014-09-15 MED ORDER — SODIUM CHLORIDE 0.9 % IV SOLN
Freq: Every day | INTRAVENOUS | Status: DC
  Administered 2014-09-15: 13:00:00 via INTRAVENOUS

## 2014-09-15 MED ORDER — SODIUM CHLORIDE 0.9 % IV SOLN
300.0000 mg | Freq: Once | INTRAVENOUS | Status: AC
  Administered 2014-09-15: 300 mg via INTRAVENOUS
  Filled 2014-09-15: qty 5

## 2014-09-15 NOTE — Progress Notes (Signed)
Pt.  Tolerated 2nd dose of Entyvio without any problems. Pt. To follow-up with doctor with any problems.

## 2014-09-15 NOTE — Discharge Instructions (Signed)
Vedolizumab injection solution What is this medicine? VEDOLIZUMAB (Ve doe LIZ you mab) is used to treat ulcerative colitis and Crohn's disease in adult patients. This medicine may be used for other purposes; ask your health care provider or pharmacist if you have questions. COMMON BRAND NAME(S): Entyvio What should I tell my health care provider before I take this medicine? They need to know if you have any of these conditions: -diabetes -hepatitis B or history of hepatitis B infection -HIV or AIDS -immune system problems -infection or history of infections -liver disease -recently received or scheduled to receive a vaccine -scheduled to have surgery -tuberculosis, a positive skin test for tuberculosis or have recently been in close contact with someone who has tuberculosis - an unusual or allergic reaction to vedolizumab, other medicines, foods, dyes, or preservatives -pregnant or trying to get pregnant -breast-feeding How should I use this medicine? This medicine is for infusion into a vein. It is given by a health care professional in a hospital or clinic setting. A special MedGuide will be given to you by the pharmacist with each prescription and refill. Be sure to read this information carefully each time. Talk to your pediatrician regarding the use of this medicine in children. This medicine is not approved for use in children. Overdosage: If you think you've taken too much of this medicine contact a poison control center or emergency room at once. Overdosage: If you think you have taken too much of this medicine contact a poison control center or emergency room at once. NOTE: This medicine is only for you. Do not share this medicine with others. What if I miss a dose? It is important not to miss your dose. Call your doctor or health care professional if you are unable to keep an appointment. What may interact with this medicine? -steroid medicines like prednisone or  cortisone -TNF-alpha inhibitors like natalizumab, adalimumab, and infliximab -vaccines This list may not describe all possible interactions. Give your health care provider a list of all the medicines, herbs, non-prescription drugs, or dietary supplements you use. Also tell them if you smoke, drink alcohol, or use illegal drugs. Some items may interact with your medicine. What should I watch for while using this medicine? Your condition will be monitored carefully while you are receiving this medicine. Visit your doctor for regular check ups. Tell your doctor or healthcare professional if your symptoms do not start to get better or if they get worse. Stay away from people who are sick. Call your doctor or health care professional for advice if you get a fever, chills or sore throat, or other symptoms of a cold or flu. Do not treat yourself. In some patients, this medicine may cause a serious brain infection that may cause death. If you have any problems seeing, thinking, speaking, walking, or standing, tell your doctor right away. If you cannot reach your doctor, get urgent medical care. What side effects may I notice from receiving this medicine? Side effects that you should report to your doctor or health care professional as soon as possible: -allergic reactions like skin rash, itching or hives, swelling of the face, lips, or tongue -breathing problems -changes in vision -chest pain -dark urine -depression, feelings of sadness -dizziness -general ill feeling or flu-like symptoms -irregular, missed, or painful menstrual periods -light-colored stools -loss of appetite, nausea -muscle weakness -problems with balance, talking, or walking -right upper belly pain -unusually weak or tired -yellowing of the eyes or skin Side effects that usually do  not require medical attention (Report these to your doctor or health care professional if they continue or are bothersome.): -aches,  pains -headache -stomach upset -tiredness This list may not describe all possible side effects. Call your doctor for medical advice about side effects. You may report side effects to FDA at 1-800-FDA-1088. Where should I keep my medicine? This drug is given in a hospital or clinic and will not be stored at home. NOTE: This sheet is a summary. It may not cover all possible information. If you have questions about this medicine, talk to your doctor, pharmacist, or health care provider.  2015, Elsevier/[employer] Standard. (2012-07-26 12:32:57)

## 2014-09-29 ENCOUNTER — Ambulatory Visit (HOSPITAL_COMMUNITY): Payer: 59

## 2014-10-07 ENCOUNTER — Telehealth: Payer: Self-pay | Admitting: Internal Medicine

## 2014-10-07 DIAGNOSIS — K50919 Crohn's disease, unspecified, with unspecified complications: Secondary | ICD-10-CM

## 2014-10-07 NOTE — Telephone Encounter (Signed)
OK to refill

## 2014-10-07 NOTE — Telephone Encounter (Signed)
Can we refill?

## 2014-10-09 MED ORDER — PROMETHAZINE HCL 25 MG PO TABS: 25.0000 mg | ORAL_TABLET | Freq: Four times a day (QID) | ORAL | Status: DC | PRN

## 2014-10-09 MED ORDER — HYDROCODONE-ACETAMINOPHEN 10-325 MG PO TABS
1.0000 | ORAL_TABLET | ORAL | Status: DC | PRN
Start: 2014-10-09 — End: 2014-10-24

## 2014-10-09 NOTE — Addendum Note (Signed)
Addended by: Barron Alvine on: 10/09/2014 10:55 AM   Modules accepted: Orders

## 2014-10-14 ENCOUNTER — Encounter (HOSPITAL_COMMUNITY)
Admission: RE | Admit: 2014-10-14 | Discharge: 2014-10-14 | Disposition: A | Discharge status: Home / Self Care | Payer: 59 | Source: Ambulatory Visit | Attending: Internal Medicine | Admitting: Internal Medicine

## 2014-10-14 VITALS — BP 145/91 | HR 62 | Temp 98.2°F | Resp 20 | Ht 67.0 in | Wt 168.0 lb

## 2014-10-14 DIAGNOSIS — K50119 Crohn's disease of large intestine with unspecified complications: Secondary | ICD-10-CM | POA: Insufficient documentation

## 2014-10-14 MED ORDER — VEDOLIZUMAB 300 MG IV SOLR
300.0000 mg | Freq: Once | INTRAVENOUS | Status: AC
  Administered 2014-10-14: 300 mg via INTRAVENOUS
  Filled 2014-10-14: qty 5

## 2014-10-14 MED ORDER — SODIUM CHLORIDE 0.9 % IV SOLN
Freq: Every day | INTRAVENOUS | Status: DC
  Administered 2014-10-14: 250 mL via INTRAVENOUS

## 2014-10-14 NOTE — Progress Notes (Addendum)
Uneventful infusion #3 of Entyvio. Next infusion will begin the every 8 week cycle and will be in 8 weeks. Pt discharged ambulatory accompanied by husband

## 2014-10-24 ENCOUNTER — Other Ambulatory Visit (INDEPENDENT_AMBULATORY_CARE_PROVIDER_SITE_OTHER): Payer: 59

## 2014-10-24 ENCOUNTER — Encounter: Payer: Self-pay | Admitting: Internal Medicine

## 2014-10-24 ENCOUNTER — Ambulatory Visit (INDEPENDENT_AMBULATORY_CARE_PROVIDER_SITE_OTHER): Payer: 59 | Admitting: Internal Medicine

## 2014-10-24 VITALS — BP 120/80 | HR 72 | Ht 67.5 in | Wt 163.2 lb

## 2014-10-24 DIAGNOSIS — K5090 Crohn's disease, unspecified, without complications: Secondary | ICD-10-CM | POA: Diagnosis not present

## 2014-10-24 DIAGNOSIS — K130 Diseases of lips: Secondary | ICD-10-CM | POA: Diagnosis not present

## 2014-10-24 DIAGNOSIS — K50919 Crohn's disease, unspecified, with unspecified complications: Secondary | ICD-10-CM | POA: Diagnosis not present

## 2014-10-24 DIAGNOSIS — D849 Immunodeficiency, unspecified: Secondary | ICD-10-CM | POA: Diagnosis not present

## 2014-10-24 DIAGNOSIS — R197 Diarrhea, unspecified: Secondary | ICD-10-CM

## 2014-10-24 DIAGNOSIS — D899 Disorder involving the immune mechanism, unspecified: Secondary | ICD-10-CM

## 2014-10-24 LAB — COMPREHENSIVE METABOLIC PANEL
ALBUMIN: 4.3 g/dL (ref 3.5–5.2)
ALK PHOS: 117 U/L (ref 39–117)
ALT: 13 U/L (ref 0–35)
AST: 14 U/L (ref 0–37)
BILIRUBIN TOTAL: 0.4 mg/dL (ref 0.2–1.2)
BUN: 9 mg/dL (ref 6–23)
CO2: 30 mEq/L (ref 19–32)
Calcium: 9.7 mg/dL (ref 8.4–10.5)
Chloride: 104 mEq/L (ref 96–112)
Creatinine, Ser: 0.99 mg/dL (ref 0.40–1.20)
GFR: 61.83 mL/min (ref >60.00)
Glucose, Bld: 96 mg/dL (ref 70–99)
POTASSIUM: 4.3 meq/L (ref 3.5–5.1)
SODIUM: 141 meq/L (ref 135–145)
TOTAL PROTEIN: 7.8 g/dL (ref 6.0–8.3)

## 2014-10-24 LAB — CBC WITH DIFFERENTIAL/PLATELET
BASOS ABS: 0 10*3/uL (ref 0.0–0.1)
Basophils Relative: 0.6 % (ref 0.0–3.0)
Eosinophils Absolute: 0.1 10*3/uL (ref 0.0–0.7)
Eosinophils Relative: 1.7 % (ref 0.0–5.0)
HCT: 38.3 % (ref 36.0–46.0)
HEMOGLOBIN: 13.4 g/dL (ref 12.0–15.0)
LYMPHS PCT: 38.4 % (ref 12.0–46.0)
Lymphs Abs: 2.6 10*3/uL (ref 0.7–4.0)
MCHC: 34.8 g/dL (ref 30.0–36.0)
MCV: 86.9 fl (ref 78.0–100.0)
MONOS PCT: 8 % (ref 3.0–12.0)
Monocytes Absolute: 0.6 10*3/uL (ref 0.1–1.0)
Neutro Abs: 3.5 10*3/uL (ref 1.4–7.7)
Neutrophils Relative %: 51.3 % (ref 43.0–77.0)
Platelets: 252 10*3/uL (ref 150.0–400.0)
RBC: 4.41 Mil/uL (ref 3.87–5.11)
RDW: 12.9 % (ref 11.5–15.5)
WBC: 6.9 10*3/uL (ref 4.0–10.5)

## 2014-10-24 MED ORDER — DICYCLOMINE HCL 10 MG PO CAPS: 10.0000 mg | ORAL_CAPSULE | Freq: Two times a day (BID) | ORAL | Status: DC

## 2014-10-24 MED ORDER — HYDROCODONE-ACETAMINOPHEN 10-325 MG PO TABS: 1.0000 | ORAL_TABLET | ORAL | Status: DC | PRN

## 2014-10-24 NOTE — Progress Notes (Signed)
[name] 23-Feb-1961 [id number]  Note: This dictation was prepared with Dragon digital system. Any transcriptional errors that result from this procedure are unintentional.   History of Present Illness: This is a 54 year old white female with  severe Crohn's disease of the small bowel and the colon. Terminal ileal resection in 2008 and 2012. Long-term steroid exposure. Chronic diarrhea and abdominal pain. Continued weight loss. Treated in the past with the Remicade and Humira  and currently Entyvio,she just completed the induction regimen. She is also on Entocort 9 mg daily, Imuran 100 mg daily and prednisone 15 mg daily. Last colonoscopy in March 2016 showed tubular adenoma. She is having watery diarrhea, including  2-3 loose bowel movements through the night. She has tried  [name] but does not like it. She takes  Lomotil 3 times a day and sometimes alternates with Imodium. Hydrocodone seems to help the diarrhea    Past Medical History  Diagnosis Date  . Crohn's disease   . Depression   . GERD (gastroesophageal reflux disease)   . Vitamin B12 deficiency   . Psoriasis     SKIN  . Family history of malignant neoplasm of gastrointestinal tract   . Crohn's colitis   . PONV (postoperative nausea and vomiting)   . Hypertension   . H/O hiatal hernia   . Seasonal asthma   . S/P dilatation of esophageal stricture   . History of adenomatous polyp of colon   . History of small bowel obstruction     SECONDARY CROHN'S STRICTURE  S/P COLECTOMY  . History of gastritis     AND HX ILEITIS  . Chronic diarrhea   . Internal hemorrhoids   . History of kidney stones   . Right ureteral stone   . Hypopotassemia   . Chronic disease anemia   . Urgency of urination   . Arthritis   . Hyperlipidemia   . Rotavirus infection 05/31/2013  . Kidney stones     Past Surgical History  Procedure Laterality Date  . Carpal tunnel release Right 2000  . Anal fissure repair  1990's  . Cystoscopy with  retrograde pyelogram, ureteroscopy and stent placement Left 03/08/2013    Procedure: CYSTOSCOPY WITH RETROGRADE PYELOGRAM, URETEROSCOPY AND STENT PLACEMENT stone basketing;  Surgeon: Jessica Frock, MD;  Location: [facility];  Service: Urology;  Laterality: Left;  . Vaginal hysterectomy  1992  . Cholecystectomy  2002  . Laparoscopic ileocecectomy  10-09-2008    AND APPENDECTOMY (TERMINAL ILEITIS & PARTIAL SBO)  . Dx laparoscopy converted to open right colecctomy  09-15-2010    ANASTOMOTIC STRICTURE  . Cystoscopy with retrograde pyelogram, ureteroscopy and stent placement Right 04/03/2013    Procedure: [place], URETEROSCOPY AND STENT PLACEMENT;  Surgeon: Jessica Frock, MD;  Location: [facility];  Service: Urology;  Laterality: Right;    Allergies  Allergen Reactions  . Codeine Hives and Nausea And Vomiting  . Humira [Adalimumab] Rash    Family history and social history have been reviewed.  Review of Systems:   The remainder of the 10 point ROS is negative except as outlined in the H&P  Physical Exam: General Appearance thin chronically ill-appearing, in no distress Eyes  Non icteric  HEENT  Non traumatic, normocephalic  Mouth No lesion, tongue papillated,  cheilosis Neck Supple without adenopathy, thyroid not enlarged, no carotid bruits, no JVD Lungs Clear to auscultation bilaterally COR Normal S1, normal S2, regular rhythm, no murmur, quiet precordium Abdomen soft but diffusely tender. Normal  active bowel sounds, Rectal not done Extremities  No pedal edema, tender sacroiliac joints, Skin No lesions Neurological Alert and oriented x 3 Psychological Normal mood and affect  Assessment and Plan:   54 year old white female with the severe Crohn's disease diarrhea and abdominal pain. Currently on immunosuppressive therapy. I will not change her regimen.Every time we try to stop the steroid she gets worse. She will be followed by Dr. Silverio Decamp.  Next appointment October 2016. She is up-to-date on colonoscopy. She will continue biologicals, budesonide as well as low-dose prednisone.   Sacroiliitis. Low back pain. We will start Voltaren 75 mg daily. Refill on hydrocodone. Labs today CBC ,c-met, and sedimentation rates as well as urine for heavy metals ( has never been checked)    [name] 10/24/2014

## 2014-10-24 NOTE — Patient Instructions (Addendum)
GO to the basement for the labs today We have sent in your prescriptions to your pharmacy We have printed your prescription of hydrocodone    Dr [name], Dr Silverio Decamp.

## 2014-10-27 ENCOUNTER — Encounter: Payer: Self-pay | Admitting: Internal Medicine

## 2014-11-07 ENCOUNTER — Other Ambulatory Visit: Payer: Self-pay | Admitting: Internal Medicine

## 2014-11-07 DIAGNOSIS — K5010 Crohn's disease of large intestine without complications: Secondary | ICD-10-CM

## 2014-11-07 DIAGNOSIS — K50919 Crohn's disease, unspecified, with unspecified complications: Secondary | ICD-10-CM

## 2014-11-07 MED ORDER — ALPRAZOLAM 0.5 MG PO TABS: 0.5000 mg | ORAL_TABLET | Freq: Two times a day (BID) | ORAL | Status: DC

## 2014-11-07 MED ORDER — PROMETHAZINE HCL 25 MG PO TABS: 25.0000 mg | ORAL_TABLET | Freq: Four times a day (QID) | ORAL | Status: DC | PRN

## 2014-11-07 MED ORDER — OMEPRAZOLE 20 MG PO CPDR: 20.0000 mg | DELAYED_RELEASE_CAPSULE | Freq: Two times a day (BID) | ORAL | Status: DC

## 2014-11-07 MED ORDER — RANITIDINE HCL 150 MG PO TABS: 300.0000 mg | ORAL_TABLET | Freq: Every day | ORAL | Status: DC

## 2014-11-07 MED ORDER — AZATHIOPRINE 50 MG PO TABS: 100.0000 mg | ORAL_TABLET | ORAL | Status: DC

## 2014-11-07 MED ORDER — PREDNISONE 10 MG PO TABS: ORAL_TABLET | ORAL | Status: DC

## 2014-11-07 MED ORDER — CYCLOBENZAPRINE HCL 10 MG PO TABS: 10.0000 mg | ORAL_TABLET | Freq: Every evening | ORAL | Status: DC | PRN

## 2014-11-07 NOTE — Telephone Encounter (Signed)
Refills x 2 have been sent to pharmacy for omeprazole 20 mg, cyclobenzaprine 10 mg, ranitidine 150 mg, azathioprine 50 mg, xanax 0.5 mg, prednisone 10 mg and phenergan 25 mg.

## 2014-11-07 NOTE — Addendum Note (Signed)
Addended by: Larina Bras on: 11/07/2014 04:41 PM   Modules accepted: Orders

## 2014-11-14 ENCOUNTER — Ambulatory Visit: Payer: 59 | Admitting: Internal Medicine

## 2014-11-24 ENCOUNTER — Ambulatory Visit (HOSPITAL_COMMUNITY): Payer: 59

## 2014-12-02 ENCOUNTER — Telehealth: Payer: Self-pay | Admitting: *Deleted

## 2014-12-02 ENCOUNTER — Other Ambulatory Visit: Payer: Self-pay | Admitting: *Deleted

## 2014-12-02 DIAGNOSIS — K50119 Crohn's disease of large intestine with unspecified complications: Secondary | ICD-10-CM

## 2014-12-02 NOTE — Telephone Encounter (Signed)
Yes, that is ok. Details should be the same as her early Aug infusion.  Thanks

## 2014-12-02 NOTE — Telephone Encounter (Signed)
Received a call from [facility] short stay. Crohn's patient former [name] patient is due to [facility] on 12/09/14.  Up to date on TB testing.She is scheduled to see Dr. Silverio Decamp on 12/31/14.  Is it ok to write order for Entyvio? DOD- Ardis Hughs.

## 2014-12-02 NOTE — Telephone Encounter (Signed)
Orders in EPIC

## 2014-12-09 ENCOUNTER — Encounter (HOSPITAL_COMMUNITY)
Admission: RE | Admit: 2014-12-09 | Discharge: 2014-12-09 | Disposition: A | Discharge status: Home / Self Care | Payer: 59 | Source: Ambulatory Visit | Attending: Gastroenterology | Admitting: Gastroenterology

## 2014-12-09 ENCOUNTER — Encounter (HOSPITAL_COMMUNITY): Payer: Self-pay

## 2014-12-09 VITALS — BP 169/93 | HR 74 | Temp 97.3°F | Resp 20 | Ht 67.0 in | Wt 167.5 lb

## 2014-12-09 DIAGNOSIS — K50119 Crohn's disease of large intestine with unspecified complications: Secondary | ICD-10-CM | POA: Insufficient documentation

## 2014-12-09 MED ORDER — SODIUM CHLORIDE 0.9 % IV SOLN
INTRAVENOUS | Status: DC
  Administered 2014-12-09: 250 mL via INTRAVENOUS

## 2014-12-09 MED ORDER — VEDOLIZUMAB 300 MG IV SOLR
300.0000 mg | Freq: Once | INTRAVENOUS | Status: AC
  Administered 2014-12-09: 300 mg via INTRAVENOUS
  Filled 2014-12-09: qty 5

## 2014-12-09 NOTE — Discharge Instructions (Signed)
ENTYVIO Vedolizumab injection solution What is this medicine? VEDOLIZUMAB (Ve doe LIZ you mab) is used to treat ulcerative colitis and Crohn's disease in adult patients. This medicine may be used for other purposes; ask your health care provider or pharmacist if you have questions. COMMON BRAND NAME(S): Entyvio What should I tell my health care provider before I take this medicine? They need to know if you have any of these conditions: -diabetes -hepatitis B or history of hepatitis B infection -HIV or AIDS -immune system problems -infection or history of infections -liver disease -recently received or scheduled to receive a vaccine -scheduled to have surgery -tuberculosis, a positive skin test for tuberculosis or have recently been in close contact with someone who has tuberculosis - an unusual or allergic reaction to vedolizumab, other medicines, foods, dyes, or preservatives -pregnant or trying to get pregnant -breast-feeding How should I use this medicine? This medicine is for infusion into a vein. It is given by a health care professional in a hospital or clinic setting. A special MedGuide will be given to you by the pharmacist with each prescription and refill. Be sure to read this information carefully each time. Talk to your pediatrician regarding the use of this medicine in children. This medicine is not approved for use in children. Overdosage: If you think you've taken too much of this medicine contact a poison control center or emergency room at once. Overdosage: If you think you have taken too much of this medicine contact a poison control center or emergency room at once. NOTE: This medicine is only for you. Do not share this medicine with others. What if I miss a dose? It is important not to miss your dose. Call your doctor or health care professional if you are unable to keep an appointment. What may interact with this medicine? -steroid medicines like prednisone or  cortisone -TNF-alpha inhibitors like natalizumab, adalimumab, and infliximab -vaccines This list may not describe all possible interactions. Give your health care provider a list of all the medicines, herbs, non-prescription drugs, or dietary supplements you use. Also tell them if you smoke, drink alcohol, or use illegal drugs. Some items may interact with your medicine. What should I watch for while using this medicine? Your condition will be monitored carefully while you are receiving this medicine. Visit your doctor for regular check ups. Tell your doctor or healthcare professional if your symptoms do not start to get better or if they get worse. Stay away from people who are sick. Call your doctor or health care professional for advice if you get a fever, chills or sore throat, or other symptoms of a cold or flu. Do not treat yourself. In some patients, this medicine may cause a serious brain infection that may cause death. If you have any problems seeing, thinking, speaking, walking, or standing, tell your doctor right away. If you cannot reach your doctor, get urgent medical care. What side effects may I notice from receiving this medicine? Side effects that you should report to your doctor or health care professional as soon as possible: -allergic reactions like skin rash, itching or hives, swelling of the face, lips, or tongue -breathing problems -changes in vision -chest pain -dark urine -depression, feelings of sadness -dizziness -general ill feeling or flu-like symptoms -irregular, missed, or painful menstrual periods -light-colored stools -loss of appetite, nausea -muscle weakness -problems with balance, talking, or walking -right upper belly pain -unusually weak or tired -yellowing of the eyes or skin Side effects that usually  do not require medical attention (Report these to your doctor or health care professional if they continue or are bothersome.): -aches,  pains -headache -stomach upset -tiredness This list may not describe all possible side effects. Call your doctor for medical advice about side effects. You may report side effects to FDA at 1-800-FDA-1088. Where should I keep my medicine? This drug is given in a hospital or clinic and will not be stored at home. NOTE: This sheet is a summary. It may not cover all possible information. If you have questions about this medicine, talk to your doctor, pharmacist, or health care provider.  2015, [employer] Standard. (2012-07-26 12:32:57)

## 2014-12-12 ENCOUNTER — Telehealth: Payer: Self-pay | Admitting: Gastroenterology

## 2014-12-12 NOTE — Telephone Encounter (Signed)
Called to the [phone]. Person answering phone said this is not the patient's number. Called to the home phone number. No answer. I have left message for the patient to call back.

## 2014-12-12 NOTE — Telephone Encounter (Signed)
I have left message for the patient to call back

## 2014-12-16 NOTE — Telephone Encounter (Signed)
I have sent an email in addition to leaving a phone message.

## 2014-12-17 ENCOUNTER — Ambulatory Visit: Payer: 59 | Admitting: Physician Assistant

## 2014-12-31 ENCOUNTER — Telehealth: Payer: Self-pay | Admitting: *Deleted

## 2014-12-31 ENCOUNTER — Encounter: Payer: Self-pay | Admitting: Gastroenterology

## 2014-12-31 ENCOUNTER — Other Ambulatory Visit (INDEPENDENT_AMBULATORY_CARE_PROVIDER_SITE_OTHER): Payer: 59

## 2014-12-31 ENCOUNTER — Ambulatory Visit (INDEPENDENT_AMBULATORY_CARE_PROVIDER_SITE_OTHER): Payer: 59 | Admitting: Gastroenterology

## 2014-12-31 VITALS — BP 110/60 | HR 88 | Ht 67.5 in | Wt 165.0 lb

## 2014-12-31 DIAGNOSIS — M461 Sacroiliitis, not elsewhere classified: Secondary | ICD-10-CM | POA: Diagnosis not present

## 2014-12-31 DIAGNOSIS — I10 Essential (primary) hypertension: Secondary | ICD-10-CM | POA: Diagnosis not present

## 2014-12-31 DIAGNOSIS — K5090 Crohn's disease, unspecified, without complications: Secondary | ICD-10-CM

## 2014-12-31 DIAGNOSIS — K5010 Crohn's disease of large intestine without complications: Secondary | ICD-10-CM

## 2014-12-31 DIAGNOSIS — E538 Deficiency of other specified B group vitamins: Secondary | ICD-10-CM

## 2014-12-31 LAB — FOLATE: Folate: 8.4 ng/mL (ref >5.9)

## 2014-12-31 LAB — SEDIMENTATION RATE: Sed Rate: 42 mm/hr — ABNORMAL HIGH (ref 0–22)

## 2014-12-31 LAB — HEPATIC FUNCTION PANEL
ALT: 12 U/L (ref 0–35)
AST: 14 U/L (ref 0–37)
Albumin: 4.4 g/dL (ref 3.5–5.2)
Alkaline Phosphatase: 114 U/L (ref 39–117)
BILIRUBIN DIRECT: 0.1 mg/dL (ref 0.0–0.3)
BILIRUBIN TOTAL: 0.5 mg/dL (ref 0.2–1.2)
TOTAL PROTEIN: 8 g/dL (ref 6.0–8.3)

## 2014-12-31 LAB — BASIC METABOLIC PANEL
BUN: 12 mg/dL (ref 6–23)
CALCIUM: 9.6 mg/dL (ref 8.4–10.5)
CO2: 27 mEq/L (ref 19–32)
CREATININE: 0.89 mg/dL (ref 0.40–1.20)
Chloride: 106 mEq/L (ref 96–112)
GFR: 69.86 mL/min (ref >60.00)
Glucose, Bld: 105 mg/dL — ABNORMAL HIGH (ref 70–99)
Potassium: 3.6 mEq/L (ref 3.5–5.1)
Sodium: 142 mEq/L (ref 135–145)

## 2014-12-31 LAB — CBC WITH DIFFERENTIAL/PLATELET
Basophils Absolute: 0.1 10*3/uL (ref 0.0–0.1)
Basophils Relative: 0.7 % (ref 0.0–3.0)
EOS PCT: 1.7 % (ref 0.0–5.0)
Eosinophils Absolute: 0.1 10*3/uL (ref 0.0–0.7)
HCT: 40.9 % (ref 36.0–46.0)
Hemoglobin: 13.7 g/dL (ref 12.0–15.0)
LYMPHS ABS: 3 10*3/uL (ref 0.7–4.0)
Lymphocytes Relative: 39.3 % (ref 12.0–46.0)
MCHC: 33.6 g/dL (ref 30.0–36.0)
MCV: 89 fl (ref 78.0–100.0)
MONOS PCT: 8.3 % (ref 3.0–12.0)
Monocytes Absolute: 0.6 10*3/uL (ref 0.1–1.0)
NEUTROS ABS: 3.9 10*3/uL (ref 1.4–7.7)
NEUTROS PCT: 50 % (ref 43.0–77.0)
PLATELETS: 237 10*3/uL (ref 150.0–400.0)
RBC: 4.6 Mil/uL (ref 3.87–5.11)
RDW: 12.9 % (ref 11.5–15.5)
WBC: 7.7 10*3/uL (ref 4.0–10.5)

## 2014-12-31 LAB — IBC PANEL
IRON: 56 ug/dL (ref 42–145)
SATURATION RATIOS: 12.7 % — AB (ref 20.0–50.0)
Transferrin: 316 mg/dL (ref 212.0–360.0)

## 2014-12-31 LAB — VITAMIN B12: VITAMIN B 12: 293 pg/mL (ref 211–911)

## 2014-12-31 LAB — FERRITIN: FERRITIN: 121.2 ng/mL (ref 10.0–291.0)

## 2014-12-31 LAB — HIGH SENSITIVITY CRP: CRP HIGH SENSITIVITY: 2.42 mg/L (ref 0.000–5.000)

## 2014-12-31 MED ORDER — HYDROCORTISONE ACE-PRAMOXINE 2.5-1 % RE CREA: TOPICAL_CREAM | Freq: Four times a day (QID) | RECTAL | Status: DC

## 2014-12-31 MED ORDER — OMEPRAZOLE 20 MG PO CPDR: 20.0000 mg | DELAYED_RELEASE_CAPSULE | Freq: Two times a day (BID) | ORAL | Status: DC

## 2014-12-31 MED ORDER — RANITIDINE HCL 150 MG PO TABS: 300.0000 mg | ORAL_TABLET | Freq: Every day | ORAL | Status: DC

## 2014-12-31 MED ORDER — PREDNISONE 10 MG PO TABS: ORAL_TABLET | ORAL | Status: DC

## 2014-12-31 MED ORDER — HYDROCODONE-ACETAMINOPHEN 10-325 MG PO TABS: 1.0000 | ORAL_TABLET | ORAL | Status: DC | PRN

## 2014-12-31 MED ORDER — AZATHIOPRINE 50 MG PO TABS: 100.0000 mg | ORAL_TABLET | ORAL | Status: DC

## 2014-12-31 MED ORDER — PROMETHAZINE HCL 25 MG PO TABS: 25.0000 mg | ORAL_TABLET | Freq: Four times a day (QID) | ORAL | Status: DC | PRN

## 2014-12-31 MED ORDER — LOPERAMIDE HCL 2 MG PO CAPS: 4.0000 mg | ORAL_CAPSULE | Freq: Two times a day (BID) | ORAL | Status: DC

## 2014-12-31 MED ORDER — BUDESONIDE 3 MG PO CPEP: 9.0000 mg | ORAL_CAPSULE | Freq: Every day | ORAL | Status: DC

## 2014-12-31 NOTE — Telephone Encounter (Signed)
LM to return my call. 

## 2014-12-31 NOTE — Patient Instructions (Signed)
We have refilled your medications We have given you a printed signed prescription of your pain medication We will refer you to [facility] , we will contact you with that appointment Go to the basement for labs today Follow up in three months

## 2015-01-01 NOTE — Progress Notes (Signed)
Ok to get b12 inj
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
## 2015-01-02 ENCOUNTER — Telehealth: Payer: Self-pay

## 2015-01-02 ENCOUNTER — Other Ambulatory Visit: Payer: Self-pay

## 2015-01-02 MED ORDER — CYANOCOBALAMIN 1000 MCG/ML IJ SOLN: 1000.0000 ug | INTRAMUSCULAR | Status: DC

## 2015-01-02 MED ORDER — NEEDLES & SYRINGES MISC: 1.0000 | Status: DC

## 2015-01-02 NOTE — Telephone Encounter (Signed)
I will order the medication. She has a family member that can give them to her. Do you want to titrate her up or can she go straight to 1000 mcg monthly? I will order her syringes also. Thank you.

## 2015-01-02 NOTE — Telephone Encounter (Signed)
-----   Message from Mauri Pole, MD sent at 12/31/2014  2:04 PM EDT ----- Please inform patient sed rate is lower than what it was 6 months ago. Iron, B12 and folate slightly lower, she should start taking PO Ferrous sulfate 325 TID, B12 1073mg daily and folate 140mdaily.

## 2015-01-02 NOTE — Telephone Encounter (Signed)
received note and put on Dr Jillyn Hidden desk

## 2015-01-02 NOTE — Telephone Encounter (Signed)
ok 

## 2015-01-02 NOTE — Telephone Encounter (Signed)
The patient is informed of her results. She asks to restart B12 injection instead of taking B12 orally. Please advise

## 2015-01-02 NOTE — Telephone Encounter (Signed)
Rx's sent to the pharmacy. The patient is instructed.

## 2015-01-02 NOTE — Telephone Encounter (Signed)
Patient states she was seen at the office of her PCP Dr Theda Sers ([place] [phone]).  Contacted that medical office and have requested the note be faxed to [phone]

## 2015-01-02 NOTE — Telephone Encounter (Signed)
1000 mcg monthly, thanks

## 2015-01-27 ENCOUNTER — Encounter: Payer: Self-pay | Admitting: Gastroenterology

## 2015-01-27 ENCOUNTER — Ambulatory Visit: Payer: 59 | Admitting: Gastroenterology

## 2015-01-27 ENCOUNTER — Other Ambulatory Visit: Payer: Self-pay | Admitting: Gastroenterology

## 2015-02-01 ENCOUNTER — Other Ambulatory Visit: Payer: Self-pay | Admitting: Internal Medicine

## 2015-02-02 ENCOUNTER — Other Ambulatory Visit: Payer: Self-pay

## 2015-02-02 ENCOUNTER — Other Ambulatory Visit: Payer: Self-pay | Admitting: *Deleted

## 2015-02-02 DIAGNOSIS — K50118 Crohn's disease of large intestine with other complication: Secondary | ICD-10-CM

## 2015-02-02 DIAGNOSIS — K5010 Crohn's disease of large intestine without complications: Secondary | ICD-10-CM

## 2015-02-02 MED ORDER — ALPRAZOLAM 0.5 MG PO TABS: 0.5000 mg | ORAL_TABLET | Freq: Two times a day (BID) | ORAL | Status: DC

## 2015-02-02 NOTE — Telephone Encounter (Signed)
Please send Rx for 1 week until her appointment with pain management

## 2015-02-02 NOTE — Telephone Encounter (Signed)
Patient did not come for her appointment last week. She wants Xanax refills... Has an appointment in Jan. I did not figure you would refill this.

## 2015-02-02 NOTE — Telephone Encounter (Signed)
Faxed in rx for 1 week  Including today I sent in #[phone]  0 Refills Faxed to [facility]

## 2015-02-06 ENCOUNTER — Encounter (HOSPITAL_COMMUNITY)
Admission: RE | Admit: 2015-02-06 | Discharge: 2015-02-06 | Disposition: A | Discharge status: Home / Self Care | Payer: 59 | Source: Ambulatory Visit | Attending: Internal Medicine | Admitting: Internal Medicine

## 2015-02-06 ENCOUNTER — Encounter (HOSPITAL_COMMUNITY): Payer: Self-pay

## 2015-02-06 VITALS — BP 127/85 | HR 76 | Temp 98.2°F | Resp 16 | Ht 67.5 in | Wt 166.5 lb

## 2015-02-06 DIAGNOSIS — K50119 Crohn's disease of large intestine with unspecified complications: Secondary | ICD-10-CM | POA: Insufficient documentation

## 2015-02-06 DIAGNOSIS — K50118 Crohn's disease of large intestine with other complication: Secondary | ICD-10-CM

## 2015-02-06 MED ORDER — SODIUM CHLORIDE 0.9 % IV SOLN
300.0000 mg | Freq: Once | INTRAVENOUS | Status: AC
  Administered 2015-02-06: 300 mg via INTRAVENOUS
  Filled 2015-02-06: qty 5

## 2015-02-06 MED ORDER — SODIUM CHLORIDE 0.9 % IV SOLN
INTRAVENOUS | Status: DC
  Administered 2015-02-06: 10:00:00 via INTRAVENOUS

## 2015-02-06 NOTE — Discharge Instructions (Signed)
Vedolizumab injection solution What is this medicine? VEDOLIZUMAB (Ve doe LIZ you mab) is used to treat ulcerative colitis and Crohn's disease in adult patients. This medicine may be used for other purposes; ask your health care provider or pharmacist if you have questions. What should I tell my health care provider before I take this medicine? They need to know if you have any of these conditions: -diabetes -hepatitis B or history of hepatitis B infection -HIV or AIDS -immune system problems -infection or history of infections -liver disease -recently received or scheduled to receive a vaccine -scheduled to have surgery -tuberculosis, a positive skin test for tuberculosis or have recently been in close contact with someone who has tuberculosis - an unusual or allergic reaction to vedolizumab, other medicines, foods, dyes, or preservatives -pregnant or trying to get pregnant -breast-feeding How should I use this medicine? This medicine is for infusion into a vein. It is given by a health care professional in a hospital or clinic setting. A special MedGuide will be given to you by the pharmacist with each prescription and refill. Be sure to read this information carefully each time. Talk to your pediatrician regarding the use of this medicine in children. This medicine is not approved for use in children. Overdosage: If you think you have taken too much of this medicine contact a poison control center or emergency room at once. NOTE: This medicine is only for you. Do not share this medicine with others. What if I miss a dose? It is important not to miss your dose. Call your doctor or health care professional if you are unable to keep an appointment. What may interact with this medicine? -steroid medicines like prednisone or cortisone -TNF-alpha inhibitors like natalizumab, adalimumab, and infliximab -vaccines This list may not describe all possible interactions. Give your health care  provider a list of all the medicines, herbs, non-prescription drugs, or dietary supplements you use. Also tell them if you smoke, drink alcohol, or use illegal drugs. Some items may interact with your medicine. What should I watch for while using this medicine? Your condition will be monitored carefully while you are receiving this medicine. Visit your doctor for regular check ups. Tell your doctor or healthcare professional if your symptoms do not start to get better or if they get worse. Stay away from people who are sick. Call your doctor or health care professional for advice if you get a fever, chills or sore throat, or other symptoms of a cold or flu. Do not treat yourself. In some patients, this medicine may cause a serious brain infection that may cause death. If you have any problems seeing, thinking, speaking, walking, or standing, tell your doctor right away. If you cannot reach your doctor, get urgent medical care. What side effects may I notice from receiving this medicine? Side effects that you should report to your doctor or health care professional as soon as possible: -allergic reactions like skin rash, itching or hives, swelling of the face, lips, or tongue -breathing problems -changes in vision -chest pain -dark urine -depression, feelings of sadness -dizziness -general ill feeling or flu-like symptoms -irregular, missed, or painful menstrual periods -light-colored stools -loss of appetite, nausea -muscle weakness -problems with balance, talking, or walking -right upper belly pain -unusually weak or tired -yellowing of the eyes or skin Side effects that usually do not require medical attention (Report these to your doctor or health care professional if they continue or are bothersome.): -aches, pains -headache -stomach upset -tiredness  This list may not describe all possible side effects. Call your doctor for medical advice about side effects. You may report side  effects to FDA at 1-800-FDA-1088. Where should I keep my medicine? This drug is given in a hospital or clinic and will not be stored at home. NOTE: This sheet is a summary. It may not cover all possible information. If you have questions about this medicine, talk to your doctor, pharmacist, or health care provider.    2016, Elsevier/Gold Standard. (2012-07-26 12:32:57)

## 2015-02-10 ENCOUNTER — Other Ambulatory Visit: Payer: Self-pay | Admitting: Pain Medicine

## 2015-02-10 DIAGNOSIS — M545 Low back pain: Secondary | ICD-10-CM

## 2015-02-21 ENCOUNTER — Ambulatory Visit
Admission: RE | Admit: 2015-02-21 | Discharge: 2015-02-21 | Disposition: A | Discharge status: Home / Self Care | Payer: 59 | Source: Ambulatory Visit | Attending: Pain Medicine | Admitting: Pain Medicine

## 2015-02-21 DIAGNOSIS — M545 Low back pain: Secondary | ICD-10-CM

## 2015-03-05 ENCOUNTER — Other Ambulatory Visit: Payer: Self-pay | Admitting: Gastroenterology

## 2015-03-26 ENCOUNTER — Other Ambulatory Visit: Payer: Self-pay

## 2015-03-26 DIAGNOSIS — K50118 Crohn's disease of large intestine with other complication: Secondary | ICD-10-CM

## 2015-03-31 ENCOUNTER — Other Ambulatory Visit (INDEPENDENT_AMBULATORY_CARE_PROVIDER_SITE_OTHER): Payer: 59

## 2015-03-31 ENCOUNTER — Other Ambulatory Visit: Payer: Self-pay | Admitting: Gastroenterology

## 2015-03-31 ENCOUNTER — Ambulatory Visit (INDEPENDENT_AMBULATORY_CARE_PROVIDER_SITE_OTHER): Payer: 59 | Admitting: Gastroenterology

## 2015-03-31 ENCOUNTER — Encounter: Payer: Self-pay | Admitting: Gastroenterology

## 2015-03-31 VITALS — BP 118/74 | HR 88 | Ht 67.5 in | Wt 177.4 lb

## 2015-03-31 DIAGNOSIS — K5010 Crohn's disease of large intestine without complications: Secondary | ICD-10-CM

## 2015-03-31 LAB — CBC WITH DIFFERENTIAL/PLATELET
BASOS ABS: 0 10*3/uL (ref 0.0–0.1)
Basophils Relative: 0.5 % (ref 0.0–3.0)
EOS ABS: 0.1 10*3/uL (ref 0.0–0.7)
Eosinophils Relative: 2.1 % (ref 0.0–5.0)
HEMATOCRIT: 36.4 % (ref 36.0–46.0)
Hemoglobin: 12.3 g/dL (ref 12.0–15.0)
LYMPHS PCT: 30.9 % (ref 12.0–46.0)
Lymphs Abs: 2 10*3/uL (ref 0.7–4.0)
MCHC: 33.8 g/dL (ref 30.0–36.0)
MCV: 90.2 fl (ref 78.0–100.0)
Monocytes Absolute: 0.6 10*3/uL (ref 0.1–1.0)
Monocytes Relative: 10 % (ref 3.0–12.0)
NEUTROS ABS: 3.6 10*3/uL (ref 1.4–7.7)
Neutrophils Relative %: 56.5 % (ref 43.0–77.0)
PLATELETS: 234 10*3/uL (ref 150.0–400.0)
RBC: 4.03 Mil/uL (ref 3.87–5.11)
RDW: 12.9 % (ref 11.5–15.5)
WBC: 6.4 10*3/uL (ref 4.0–10.5)

## 2015-03-31 LAB — COMPREHENSIVE METABOLIC PANEL
ALBUMIN: 4 g/dL (ref 3.5–5.2)
ALT: 66 U/L — AB (ref 0–35)
AST: 103 U/L — AB (ref 0–37)
Alkaline Phosphatase: 120 U/L — ABNORMAL HIGH (ref 39–117)
BILIRUBIN TOTAL: 0.4 mg/dL (ref 0.2–1.2)
BUN: 12 mg/dL (ref 6–23)
CALCIUM: 8.8 mg/dL (ref 8.4–10.5)
CO2: 29 mEq/L (ref 19–32)
CREATININE: 0.82 mg/dL (ref 0.40–1.20)
Chloride: 105 mEq/L (ref 96–112)
GFR: 76.72 mL/min (ref >60.00)
Glucose, Bld: 97 mg/dL (ref 70–99)
Potassium: 4.3 mEq/L (ref 3.5–5.1)
SODIUM: 141 meq/L (ref 135–145)
Total Protein: 7.1 g/dL (ref 6.0–8.3)

## 2015-03-31 LAB — HIGH SENSITIVITY CRP: CRP HIGH SENSITIVITY: 2.64 mg/L (ref 0.000–5.000)

## 2015-03-31 LAB — SEDIMENTATION RATE: Sed Rate: 37 mm/hr — ABNORMAL HIGH (ref 0–22)

## 2015-03-31 MED ORDER — HYDROCORTISONE ACETATE 25 MG RE SUPP: RECTAL | Status: DC

## 2015-03-31 NOTE — Progress Notes (Signed)
[name]    [id number]    1961/01/04  Primary Care Physician:COLLINS, [place], DO  Referring Physician: [name], DO [address] [place], [place] [address]  Chief complaint:  Diarrhea   HPI: 55 year old white female with severe Crohn's disease of the small bowel and the colon s/p Terminal ileal resection in 2008 and 2012 on chronic steroid with Chronic diarrhea and abdominal pain is here for follow up visit. She was treated in the past with the Remicade and Humira and currently Entyvio, reports having an episode of acute gastroenteritis with nausea vomiting and diarrhea a few days ago.  But she reports significant diarrhea even at baseline has been having 8-10 bowel movements a day and significantly caught worse with this episode where she was going up to 15-20 times a day.  No blood or mucus in stool.  He is continuing on Entocort 9 mg daily and Imuran 100 mg daily.  She has tapered off the prednisone.   Last colonoscopy in March 2016 showed tubular adenoma. She takes Lomotil 3 times a day and sometimes alternates with Imodium. Hydrocodone seems to help the diarrhea.  She is following with pain management clinic and is on Percocet for chronic pain    Outpatient Encounter Prescriptions as of 03/31/2015  Medication Sig  . albuterol (PROAIR HFA) 108 (90 BASE) MCG/ACT inhaler Inhale 2 puffs into the lungs every 4 (four) hours as needed for wheezing or shortness of breath.   . ALPRAZolam (XANAX) 0.5 MG tablet Take 1 tablet (0.5 mg total) by mouth 2 (two) times daily.  Marland Kitchen azaTHIOprine (IMURAN) 50 MG tablet Take 2 tablets (100 mg total) by mouth every morning.  . budesonide (ENTOCORT EC) 3 MG 24 hr capsule Take 3 capsules (9 mg total) by mouth daily.  . budesonide-formoterol (SYMBICORT) 80-4.5 MCG/ACT inhaler Inhale 2 puffs into the lungs 2 (two) times daily as needed (wheezing).  . cyanocobalamin (,VITAMIN B-12,) 1000 MCG/ML injection Inject 1 mL (1,000 mcg total) into  the skin every 30 (thirty) days.  Marland Kitchen econazole nitrate 1 % cream Apply topically daily.  Marland Kitchen gabapentin (NEURONTIN) 400 MG capsule   . HYDROcodone-acetaminophen (NORCO) 10-325 MG tablet   . hydrocortisone (ANUSOL-HC) 25 MG suppository Insert 1 suppository into rectum twice daily x 1 week, then insert 1 suppository per rectum once every night thereafter until gone.  . Needles & Syringes MISC 1 Syringe by Does not apply route every 30 (thirty) days.  Marland Kitchen omeprazole (PRILOSEC) 20 MG capsule Take 1 capsule (20 mg total) by mouth 2 (two) times daily before a meal.  . pramoxine-hydrocortisone (ANALPRAM HC) cream Apply 1 application topically 4 (four) times daily.  . promethazine (PHENERGAN) 25 MG suppository Place 1 suppository (25 mg total) rectally every 6 (six) hours as needed for nausea or vomiting.  . promethazine (PHENERGAN) 25 MG tablet Take 1 tablet (25 mg total) by mouth every 8 (eight) hours as needed for nausea or vomiting.  . ranitidine (ZANTAC) 150 MG tablet Take 2 tablets (300 mg total) by mouth at bedtime.  . Vedolizumab (ENTYVIO IV) Inject into the vein every 8 (eight) weeks.  . [DISCONTINUED] Clotrimazole 1 % OINT Apply 1 application topically 2 (two) times daily. Apply to areas that are affected  . [DISCONTINUED] cyclobenzaprine (FLEXERIL) 10 MG tablet Take 1 tablet (10 mg total) by mouth at bedtime as needed for muscle spasms.  . [DISCONTINUED] diphenoxylate-atropine (LOMOTIL) 2.5-0.025 MG per tablet TAKE TWO TABLETS  BY MOUTH TWICE DAILY  . [DISCONTINUED] fluconazole (DIFLUCAN) 200 MG tablet Take 1 tablet (200 mg total) by mouth daily.  . [DISCONTINUED] HYDROcodone-acetaminophen (NORCO) 10-325 MG tablet Take 1 tablet by mouth every 4 (four) hours as needed.  . [DISCONTINUED] hydrocortisone-pramoxine (ANALPRAM-HC) 2.5-1 % rectal cream Place rectally 4 (four) times daily.  . [DISCONTINUED] loperamide (IMODIUM) 2 MG capsule Take 2 capsules (4 mg total) by mouth 2 (two) times daily.  .  [DISCONTINUED] predniSONE (DELTASONE) 10 MG tablet Take as directed  . [DISCONTINUED] predniSONE (DELTASONE) 5 MG tablet Take 2 tablets (10 mg total) by mouth daily with breakfast.   No facility-administered encounter medications on file as of 03/31/2015.    Allergies as of 03/31/2015 - Review Complete 03/31/2015  Allergen Reaction Noted  . Codeine Hives and Nausea And Vomiting 08/31/2007  . Humira [adalimumab] Rash 12/24/2013    Past Medical History  Diagnosis Date  . Crohn's disease ([place])   . Depression   . GERD (gastroesophageal reflux disease)   . Vitamin B12 deficiency   . Psoriasis     SKIN  . Family history of malignant neoplasm of gastrointestinal tract   . Crohn's colitis ([place])   . PONV (postoperative nausea and vomiting)   . Hypertension   . H/O hiatal hernia   . Seasonal asthma   . S/P dilatation of esophageal stricture   . History of adenomatous polyp of colon   . History of small bowel obstruction     SECONDARY CROHN'S STRICTURE  S/P COLECTOMY  . History of gastritis     AND HX ILEITIS  . Chronic diarrhea   . Internal hemorrhoids   . History of kidney stones   . Right ureteral stone   . Hypopotassemia   . Chronic disease anemia   . Urgency of urination   . Arthritis   . Hyperlipidemia   . Rotavirus infection 05/31/2013  . Kidney stones     Past Surgical History  Procedure Laterality Date  . Carpal tunnel release Right 2000  . Anal fissure repair  1990's  . Cystoscopy with retrograde pyelogram, ureteroscopy and stent placement Left 03/08/2013    Procedure: CYSTOSCOPY WITH RETROGRADE PYELOGRAM, URETEROSCOPY AND STENT PLACEMENT stone basketing;  Surgeon: Alexis Frock, MD;  Location: [facility];  Service: Urology;  Laterality: Left;  . Vaginal hysterectomy  1992  . Cholecystectomy  2002  . Laparoscopic ileocecectomy  10-09-2008    AND APPENDECTOMY (TERMINAL ILEITIS & PARTIAL SBO)  . Dx laparoscopy converted to open right colecctomy  09-15-2010    ANASTOMOTIC  STRICTURE  . Cystoscopy with retrograde pyelogram, ureteroscopy and stent placement Right 04/03/2013    Procedure: [place], URETEROSCOPY AND STENT PLACEMENT;  Surgeon: Alexis Frock, MD;  Location: [facility];  Service: Urology;  Laterality: Right;    Family History  Problem Relation Age of Onset  . Colon cancer Mother   . Breast cancer Paternal Grandmother   . Colon polyps Mother   . Colon polyps Father   . Colon polyps Brother   . Thyroid disease Neg Hx   . Lung cancer Father     Social History   Social History  . Marital Status: Married    Spouse Name: N/A  . Number of Children: 2  . Years of Education: N/A   Occupational History  . cook    Social History Main Topics  . Smoking status: Former Smoker -- 1.00 packs/day for 10 years    Types: Cigarettes  Quit date: 03/07/1984  . Smokeless tobacco: Never Used  . Alcohol Use: No  . Drug Use: No  . Sexual Activity: Not on file   Other Topics Concern  . Not on file   Social History Narrative      Review of systems: Review of Systems  Constitutional: Negative for fever and chills.  HENT: Negative.   Eyes: Negative for blurred vision.  Respiratory: Negative for cough, shortness of breath and wheezing.   Cardiovascular: Negative for chest pain and palpitations.  Gastrointestinal: as per HPI Genitourinary: Negative for dysuria, urgency, frequency and hematuria.  Musculoskeletal: Negative for myalgias, back pain and joint pain.  Skin: Negative for itching and rash.  Neurological: Negative for dizziness, tremors, focal weakness, seizures and loss of consciousness.  Endo/Heme/Allergies: Negative for environmental allergies.  Psychiatric/Behavioral: Negative for depression, suicidal ideas and hallucinations.  All other systems reviewed and are negative.   Physical Exam: Filed Vitals:   03/31/15 0913  BP: 118/74  Pulse: 88   Gen:      No acute distress HEENT:  EOMI,  sclera anicteric Neck:     No masses; no thyromegaly Lungs:    Clear to auscultation bilaterally; normal respiratory effort CV:         Regular rate and rhythm; no murmurs Abd:      + bowel sounds; soft, non-tender; no palpable masses, no distension Ext:    No edema; adequate peripheral perfusion Skin:      Warm and dry; no rash Neuro: alert and oriented x 3 Psych: normal mood and affect  Data Reviewed:   as per history of present illness   Assessment and Plan/Recommendations:  55 year old white female with severe Crohn's disease of the small bowel and the colon s/p Terminal ileal resection on Entyvio infusion,  Imuran 100 mg and budesonide 9 mg daily with persistent chronic diarrhea here for follow-up visit  Acute episode of gastroenteritis likely viral etiology  We'll also check for stool C. Difficile  We'll schedule for colonoscopy for evaluation given persistent diarrhea  check fecal fat qualitative and fecal lactoferrin  Check CBC LFT , BMP , ESR, CRP , B12 folate and ferritin level  Return after colonoscopy  Raliegh Ip Denzil Magnuson , MD [phone] Mon-Fri 8a-5p [phone] after 5p, weekends, holidays

## 2015-03-31 NOTE — Patient Instructions (Addendum)
Go to the basement for labs today  It has been recommended to you by your physician that you have a(n) Colonoscopy completed. Per your request, we did not schedule the procedure(s) today. Please contact our office at [phone] should you decide to have the procedure completed.  Follow up in 3 months

## 2015-04-03 ENCOUNTER — Encounter (HOSPITAL_COMMUNITY)
Admission: RE | Admit: 2015-04-03 | Discharge: 2015-04-03 | Disposition: A | Discharge status: Home / Self Care | Payer: 59 | Source: Ambulatory Visit | Attending: Gastroenterology | Admitting: Gastroenterology

## 2015-04-03 ENCOUNTER — Other Ambulatory Visit: Payer: Self-pay | Admitting: Gastroenterology

## 2015-04-03 ENCOUNTER — Other Ambulatory Visit: Payer: Self-pay

## 2015-04-03 ENCOUNTER — Other Ambulatory Visit: Payer: 59

## 2015-04-03 ENCOUNTER — Encounter (HOSPITAL_COMMUNITY): Payer: Self-pay

## 2015-04-03 VITALS — BP 153/85 | HR 80 | Temp 98.5°F | Resp 16 | Ht 67.5 in | Wt 178.0 lb

## 2015-04-03 DIAGNOSIS — K50119 Crohn's disease of large intestine with unspecified complications: Secondary | ICD-10-CM | POA: Diagnosis not present

## 2015-04-03 DIAGNOSIS — K50118 Crohn's disease of large intestine with other complication: Secondary | ICD-10-CM

## 2015-04-03 DIAGNOSIS — R748 Abnormal levels of other serum enzymes: Secondary | ICD-10-CM

## 2015-04-03 DIAGNOSIS — K5010 Crohn's disease of large intestine without complications: Secondary | ICD-10-CM

## 2015-04-03 MED ORDER — SODIUM CHLORIDE 0.9 % IV SOLN
Freq: Once | INTRAVENOUS | Status: AC
  Administered 2015-04-03: 11:00:00 via INTRAVENOUS

## 2015-04-03 MED ORDER — VEDOLIZUMAB 300 MG IV SOLR
300.0000 mg | Freq: Once | INTRAVENOUS | Status: AC
  Administered 2015-04-03: 300 mg via INTRAVENOUS
  Filled 2015-04-03: qty 5

## 2015-04-03 NOTE — Discharge Instructions (Signed)
ENTYVIO Vedolizumab injection solution What is this medicine? VEDOLIZUMAB (Ve doe LIZ you mab) is used to treat ulcerative colitis and Crohn's disease in adult patients. This medicine may be used for other purposes; ask your health care provider or pharmacist if you have questions. What should I tell my health care provider before I take this medicine? They need to know if you have any of these conditions: -diabetes -hepatitis B or history of hepatitis B infection -HIV or AIDS -immune system problems -infection or history of infections -liver disease -recently received or scheduled to receive a vaccine -scheduled to have surgery -tuberculosis, a positive skin test for tuberculosis or have recently been in close contact with someone who has tuberculosis - an unusual or allergic reaction to vedolizumab, other medicines, foods, dyes, or preservatives -pregnant or trying to get pregnant -breast-feeding How should I use this medicine? This medicine is for infusion into a vein. It is given by a health care professional in a hospital or clinic setting. A special MedGuide will be given to you by the pharmacist with each prescription and refill. Be sure to read this information carefully each time. Talk to your pediatrician regarding the use of this medicine in children. This medicine is not approved for use in children. Overdosage: If you think you have taken too much of this medicine contact a poison control center or emergency room at once. NOTE: This medicine is only for you. Do not share this medicine with others. What if I miss a dose? It is important not to miss your dose. Call your doctor or health care professional if you are unable to keep an appointment. What may interact with this medicine? -steroid medicines like prednisone or cortisone -TNF-alpha inhibitors like natalizumab, adalimumab, and infliximab -vaccines This list may not describe all possible interactions. Give your  health care provider a list of all the medicines, herbs, non-prescription drugs, or dietary supplements you use. Also tell them if you smoke, drink alcohol, or use illegal drugs. Some items may interact with your medicine. What should I watch for while using this medicine? Your condition will be monitored carefully while you are receiving this medicine. Visit your doctor for regular check ups. Tell your doctor or healthcare professional if your symptoms do not start to get better or if they get worse. Stay away from people who are sick. Call your doctor or health care professional for advice if you get a fever, chills or sore throat, or other symptoms of a cold or flu. Do not treat yourself. In some patients, this medicine may cause a serious brain infection that may cause death. If you have any problems seeing, thinking, speaking, walking, or standing, tell your doctor right away. If you cannot reach your doctor, get urgent medical care. What side effects may I notice from receiving this medicine? Side effects that you should report to your doctor or health care professional as soon as possible: -allergic reactions like skin rash, itching or hives, swelling of the face, lips, or tongue -breathing problems -changes in vision -chest pain -dark urine -depression, feelings of sadness -dizziness -general ill feeling or flu-like symptoms -irregular, missed, or painful menstrual periods -light-colored stools -loss of appetite, nausea -muscle weakness -problems with balance, talking, or walking -right upper belly pain -unusually weak or tired -yellowing of the eyes or skin Side effects that usually do not require medical attention (Report these to your doctor or health care professional if they continue or are bothersome.): -aches, pains -headache -stomach upset -  tiredness This list may not describe all possible side effects. Call your doctor for medical advice about side effects. You may report  side effects to FDA at 1-800-FDA-1088. Where should I keep my medicine? This drug is given in a hospital or clinic and will not be stored at home. NOTE: This sheet is a summary. It may not cover all possible information. If you have questions about this medicine, talk to your doctor, pharmacist, or health care provider.    2016, [employer]. (2012-07-26 12:32:57)

## 2015-04-04 LAB — FECAL LACTOFERRIN, QUANT: LACTOFERRIN: NEGATIVE

## 2015-04-04 LAB — C. DIFFICILE GDH AND TOXIN A/B
C. difficile GDH: NOT DETECTED
C. difficile Toxin A/B: NOT DETECTED

## 2015-04-10 ENCOUNTER — Telehealth: Payer: Self-pay

## 2015-04-10 ENCOUNTER — Other Ambulatory Visit: Payer: Self-pay | Admitting: Gastroenterology

## 2015-04-10 ENCOUNTER — Other Ambulatory Visit: Payer: Self-pay

## 2015-04-10 LAB — FECAL FAT, QUALITATIVE: FECAL FAT QUALITATIVE: ABNORMAL — AB

## 2015-04-10 MED ORDER — PANCRELIPASE (LIP-PROT-AMYL) 36000-114000 UNITS PO CPEP: ORAL_CAPSULE | ORAL | Status: DC

## 2015-04-10 NOTE — Telephone Encounter (Signed)
-----   Message from Mauri Pole, MD sent at 04/10/2015 10:39 AM EST ----- Fecal fat is abnormal, Can you please send her Rx for Creon 36000 units 2 with meals and 1 with snacks for 30 days supply. We can reassess after that. It may help with nausea.Thanks

## 2015-04-13 LAB — FECAL FAT, QUALITATIVE: Fecal Fat Qualitative: NORMAL

## 2015-04-16 ENCOUNTER — Encounter: Payer: Self-pay | Admitting: Gastroenterology

## 2015-04-22 ENCOUNTER — Other Ambulatory Visit: Payer: Self-pay | Admitting: *Deleted

## 2015-04-22 MED ORDER — PROMETHAZINE HCL 25 MG PO TABS: 25.0000 mg | ORAL_TABLET | Freq: Three times a day (TID) | ORAL | Status: DC | PRN

## 2015-04-22 NOTE — Telephone Encounter (Signed)
Refilled Promethazine per fax request from pharmacy

## 2015-05-21 ENCOUNTER — Other Ambulatory Visit: Payer: Self-pay

## 2015-05-21 DIAGNOSIS — K50118 Crohn's disease of large intestine with other complication: Secondary | ICD-10-CM

## 2015-05-28 ENCOUNTER — Other Ambulatory Visit: Payer: Self-pay

## 2015-05-28 DIAGNOSIS — K50118 Crohn's disease of large intestine with other complication: Secondary | ICD-10-CM

## 2015-05-29 ENCOUNTER — Encounter (HOSPITAL_COMMUNITY): Payer: Self-pay

## 2015-05-29 ENCOUNTER — Encounter (HOSPITAL_COMMUNITY)
Admission: RE | Admit: 2015-05-29 | Discharge: 2015-05-29 | Disposition: A | Discharge status: Home / Self Care | Payer: 59 | Source: Ambulatory Visit | Attending: Gastroenterology | Admitting: Gastroenterology

## 2015-05-29 VITALS — BP 125/77 | HR 84 | Temp 97.6°F | Resp 16 | Ht 67.0 in | Wt 198.4 lb

## 2015-05-29 DIAGNOSIS — K50119 Crohn's disease of large intestine with unspecified complications: Secondary | ICD-10-CM | POA: Diagnosis not present

## 2015-05-29 DIAGNOSIS — K50118 Crohn's disease of large intestine with other complication: Secondary | ICD-10-CM

## 2015-05-29 MED ORDER — SODIUM CHLORIDE 0.9 % IV SOLN
INTRAVENOUS | Status: DC
  Administered 2015-05-29: 10:00:00 via INTRAVENOUS

## 2015-05-29 MED ORDER — VEDOLIZUMAB 300 MG IV SOLR
300.0000 mg | Freq: Once | INTRAVENOUS | Status: AC
  Administered 2015-05-29: 300 mg via INTRAVENOUS
  Filled 2015-05-29: qty 5

## 2015-07-01 DIAGNOSIS — M545 Low back pain: Secondary | ICD-10-CM | POA: Diagnosis not present

## 2015-07-27 ENCOUNTER — Encounter (HOSPITAL_COMMUNITY): Payer: BLUE CROSS/BLUE SHIELD

## 2015-07-29 ENCOUNTER — Other Ambulatory Visit: Payer: Self-pay | Admitting: Gastroenterology

## 2015-08-04 ENCOUNTER — Encounter (HOSPITAL_COMMUNITY)
Admission: RE | Admit: 2015-08-04 | Discharge: 2015-08-04 | Disposition: A | Discharge status: Home / Self Care | Payer: BLUE CROSS/BLUE SHIELD | Source: Ambulatory Visit | Attending: Gastroenterology | Admitting: Gastroenterology

## 2015-08-04 ENCOUNTER — Encounter (HOSPITAL_COMMUNITY): Payer: Self-pay

## 2015-08-04 VITALS — BP 147/88 | HR 89 | Temp 98.0°F | Resp 18 | Ht 67.0 in | Wt 199.4 lb

## 2015-08-04 DIAGNOSIS — K50119 Crohn's disease of large intestine with unspecified complications: Secondary | ICD-10-CM | POA: Diagnosis not present

## 2015-08-04 DIAGNOSIS — K50118 Crohn's disease of large intestine with other complication: Secondary | ICD-10-CM

## 2015-08-04 MED ORDER — SODIUM CHLORIDE 0.9 % IV SOLN
INTRAVENOUS | Status: DC
  Administered 2015-08-04: 10:00:00 via INTRAVENOUS

## 2015-08-04 MED ORDER — VEDOLIZUMAB 300 MG IV SOLR
300.0000 mg | Freq: Once | INTRAVENOUS | Status: AC
  Administered 2015-08-04: 300 mg via INTRAVENOUS
  Filled 2015-08-04: qty 5

## 2015-08-04 NOTE — Discharge Instructions (Signed)
Vedolizumab injection solution What is this medicine? VEDOLIZUMAB (Ve doe LIZ you mab) is used to treat ulcerative colitis and Crohn's disease in adult patients. This medicine may be used for other purposes; ask your health care provider or pharmacist if you have questions. What should I tell my health care provider before I take this medicine? They need to know if you have any of these conditions: -diabetes -hepatitis B or history of hepatitis B infection -HIV or AIDS -immune system problems -infection or history of infections -liver disease -recently received or scheduled to receive a vaccine -scheduled to have surgery -tuberculosis, a positive skin test for tuberculosis or have recently been in close contact with someone who has tuberculosis - an unusual or allergic reaction to vedolizumab, other medicines, foods, dyes, or preservatives -pregnant or trying to get pregnant -breast-feeding How should I use this medicine? This medicine is for infusion into a vein. It is given by a health care professional in a hospital or clinic setting. A special MedGuide will be given to you by the pharmacist with each prescription and refill. Be sure to read this information carefully each time. Talk to your pediatrician regarding the use of this medicine in children. This medicine is not approved for use in children. Overdosage: If you think you have taken too much of this medicine contact a poison control center or emergency room at once. NOTE: This medicine is only for you. Do not share this medicine with others. What if I miss a dose? It is important not to miss your dose. Call your doctor or health care professional if you are unable to keep an appointment. What may interact with this medicine? -steroid medicines like prednisone or cortisone -TNF-alpha inhibitors like natalizumab, adalimumab, and infliximab -vaccines This list may not describe all possible interactions. Give your health care  provider a list of all the medicines, herbs, non-prescription drugs, or dietary supplements you use. Also tell them if you smoke, drink alcohol, or use illegal drugs. Some items may interact with your medicine. What should I watch for while using this medicine? Your condition will be monitored carefully while you are receiving this medicine. Visit your doctor for regular check ups. Tell your doctor or healthcare professional if your symptoms do not start to get better or if they get worse. Stay away from people who are sick. Call your doctor or health care professional for advice if you get a fever, chills or sore throat, or other symptoms of a cold or flu. Do not treat yourself. In some patients, this medicine may cause a serious brain infection that may cause death. If you have any problems seeing, thinking, speaking, walking, or standing, tell your doctor right away. If you cannot reach your doctor, get urgent medical care. What side effects may I notice from receiving this medicine? Side effects that you should report to your doctor or health care professional as soon as possible: -allergic reactions like skin rash, itching or hives, swelling of the face, lips, or tongue -breathing problems -changes in vision -chest pain -dark urine -depression, feelings of sadness -dizziness -general ill feeling or flu-like symptoms -irregular, missed, or painful menstrual periods -light-colored stools -loss of appetite, nausea -muscle weakness -problems with balance, talking, or walking -right upper belly pain -unusually weak or tired -yellowing of the eyes or skin Side effects that usually do not require medical attention (Report these to your doctor or health care professional if they continue or are bothersome.): -aches, pains -headache -stomach upset -tiredness  This list may not describe all possible side effects. Call your doctor for medical advice about side effects. You may report side  effects to FDA at 1-800-FDA-1088. Where should I keep my medicine? This drug is given in a hospital or clinic and will not be stored at home. NOTE: This sheet is a summary. It may not cover all possible information. If you have questions about this medicine, talk to your doctor, pharmacist, or health care provider.    2016, Elsevier/Gold Standard. (2012-07-26 12:32:57)

## 2015-08-19 DIAGNOSIS — M7060 Trochanteric bursitis, unspecified hip: Secondary | ICD-10-CM | POA: Diagnosis not present

## 2015-08-24 DIAGNOSIS — M5137 Other intervertebral disc degeneration, lumbosacral region: Secondary | ICD-10-CM | POA: Diagnosis not present

## 2015-08-24 DIAGNOSIS — G894 Chronic pain syndrome: Secondary | ICD-10-CM | POA: Diagnosis not present

## 2015-08-24 DIAGNOSIS — Z79891 Long term (current) use of opiate analgesic: Secondary | ICD-10-CM | POA: Diagnosis not present

## 2015-08-24 DIAGNOSIS — M4696 Unspecified inflammatory spondylopathy, lumbar region: Secondary | ICD-10-CM | POA: Diagnosis not present

## 2015-08-24 DIAGNOSIS — M47817 Spondylosis without myelopathy or radiculopathy, lumbosacral region: Secondary | ICD-10-CM | POA: Diagnosis not present

## 2015-08-24 DIAGNOSIS — Z79899 Other long term (current) drug therapy: Secondary | ICD-10-CM | POA: Diagnosis not present

## 2015-08-26 DIAGNOSIS — M545 Low back pain: Secondary | ICD-10-CM | POA: Diagnosis not present

## 2015-09-01 DIAGNOSIS — M545 Low back pain: Secondary | ICD-10-CM | POA: Diagnosis not present

## 2015-09-04 DIAGNOSIS — M4696 Unspecified inflammatory spondylopathy, lumbar region: Secondary | ICD-10-CM | POA: Diagnosis not present

## 2015-09-04 DIAGNOSIS — M1288 Other specific arthropathies, not elsewhere classified, other specified site: Secondary | ICD-10-CM | POA: Diagnosis not present

## 2015-09-04 DIAGNOSIS — M5137 Other intervertebral disc degeneration, lumbosacral region: Secondary | ICD-10-CM | POA: Diagnosis not present

## 2015-09-04 DIAGNOSIS — M545 Low back pain: Secondary | ICD-10-CM | POA: Diagnosis not present

## 2015-09-14 ENCOUNTER — Ambulatory Visit (INDEPENDENT_AMBULATORY_CARE_PROVIDER_SITE_OTHER): Payer: BLUE CROSS/BLUE SHIELD | Admitting: Gastroenterology

## 2015-09-14 ENCOUNTER — Encounter: Payer: Self-pay | Admitting: Gastroenterology

## 2015-09-14 ENCOUNTER — Other Ambulatory Visit (INDEPENDENT_AMBULATORY_CARE_PROVIDER_SITE_OTHER): Payer: BLUE CROSS/BLUE SHIELD

## 2015-09-14 VITALS — BP 146/84 | HR 94 | Ht 67.5 in | Wt 202.2 lb

## 2015-09-14 DIAGNOSIS — K8689 Other specified diseases of pancreas: Secondary | ICD-10-CM | POA: Diagnosis not present

## 2015-09-14 DIAGNOSIS — K5010 Crohn's disease of large intestine without complications: Secondary | ICD-10-CM | POA: Diagnosis not present

## 2015-09-14 DIAGNOSIS — K219 Gastro-esophageal reflux disease without esophagitis: Secondary | ICD-10-CM

## 2015-09-14 DIAGNOSIS — K5090 Crohn's disease, unspecified, without complications: Secondary | ICD-10-CM

## 2015-09-14 DIAGNOSIS — R197 Diarrhea, unspecified: Secondary | ICD-10-CM

## 2015-09-14 LAB — HEPATIC FUNCTION PANEL
ALBUMIN: 4.2 g/dL (ref 3.5–5.2)
ALT: 1090 U/L — ABNORMAL HIGH (ref 0–35)
AST: 338 U/L — ABNORMAL HIGH (ref 0–37)
Alkaline Phosphatase: 237 U/L — ABNORMAL HIGH (ref 39–117)
Bilirubin, Direct: 0.1 mg/dL (ref 0.0–0.3)
TOTAL PROTEIN: 7.8 g/dL (ref 6.0–8.3)
Total Bilirubin: 0.5 mg/dL (ref 0.2–1.2)

## 2015-09-14 LAB — CBC WITH DIFFERENTIAL/PLATELET
BASOS PCT: 0.4 % (ref 0.0–3.0)
Basophils Absolute: 0 10*3/uL (ref 0.0–0.1)
EOS PCT: 1.6 % (ref 0.0–5.0)
Eosinophils Absolute: 0.1 10*3/uL (ref 0.0–0.7)
HCT: 38.4 % (ref 36.0–46.0)
Hemoglobin: 12.8 g/dL (ref 12.0–15.0)
LYMPHS ABS: 1.7 10*3/uL (ref 0.7–4.0)
Lymphocytes Relative: 25.5 % (ref 12.0–46.0)
MCHC: 33.4 g/dL (ref 30.0–36.0)
MCV: 93 fl (ref 78.0–100.0)
MONO ABS: 0.4 10*3/uL (ref 0.1–1.0)
MONOS PCT: 6.2 % (ref 3.0–12.0)
NEUTROS PCT: 66.3 % (ref 43.0–77.0)
Neutro Abs: 4.4 10*3/uL (ref 1.4–7.7)
Platelets: 268 10*3/uL (ref 150.0–400.0)
RBC: 4.13 Mil/uL (ref 3.87–5.11)
RDW: 13.7 % (ref 11.5–15.5)
WBC: 6.6 10*3/uL (ref 4.0–10.5)

## 2015-09-14 LAB — BASIC METABOLIC PANEL
BUN: 11 mg/dL (ref 6–23)
CALCIUM: 9.6 mg/dL (ref 8.4–10.5)
CO2: 31 meq/L (ref 19–32)
CREATININE: 0.82 mg/dL (ref 0.40–1.20)
Chloride: 101 mEq/L (ref 96–112)
GFR: 76.59 mL/min (ref >60.00)
GLUCOSE: 110 mg/dL — AB (ref 70–99)
Potassium: 4.6 mEq/L (ref 3.5–5.1)
SODIUM: 138 meq/L (ref 135–145)

## 2015-09-14 LAB — VITAMIN B12: Vitamin B-12: 417 pg/mL (ref 211–911)

## 2015-09-14 LAB — FERRITIN: Ferritin: 476.3 ng/mL — ABNORMAL HIGH (ref 10.0–291.0)

## 2015-09-14 LAB — IBC PANEL
IRON: 109 ug/dL (ref 42–145)
SATURATION RATIOS: 22.2 % (ref 20.0–50.0)
TRANSFERRIN: 351 mg/dL (ref 212.0–360.0)

## 2015-09-14 LAB — C-REACTIVE PROTEIN: CRP: 2 mg/dL (ref 0.5–20.0)

## 2015-09-14 LAB — FOLATE: Folate: 19.1 ng/mL (ref >5.9)

## 2015-09-14 MED ORDER — CYANOCOBALAMIN 1000 MCG/ML IJ SOLN: 1000.0000 ug | INTRAMUSCULAR | Status: DC

## 2015-09-14 MED ORDER — ECONAZOLE NITRATE 1 % EX CREA: TOPICAL_CREAM | Freq: Every day | CUTANEOUS | Status: DC

## 2015-09-14 MED ORDER — OMEPRAZOLE 40 MG PO CPDR: 40.0000 mg | DELAYED_RELEASE_CAPSULE | Freq: Every day | ORAL | Status: DC

## 2015-09-14 MED ORDER — PROMETHAZINE HCL 25 MG RE SUPP: 25.0000 mg | Freq: Four times a day (QID) | RECTAL | Status: DC | PRN

## 2015-09-14 MED ORDER — PRAMOXINE-HC 1-1 % EX CREA: TOPICAL_CREAM | Freq: Three times a day (TID) | CUTANEOUS | Status: DC

## 2015-09-14 MED ORDER — RANITIDINE HCL 150 MG PO TABS: 300.0000 mg | ORAL_TABLET | Freq: Every day | ORAL | Status: DC

## 2015-09-14 MED ORDER — PANCRELIPASE (LIP-PROT-AMYL) 36000-114000 UNITS PO CPEP: ORAL_CAPSULE | ORAL | Status: DC

## 2015-09-14 NOTE — Patient Instructions (Signed)
Go to the basement today for labs We will refill your medications today  Stop IMURAN Stop ENTOCORT Follow up as needed

## 2015-09-14 NOTE — Progress Notes (Addendum)
[name]    [id number]    01-Nov-1960  Primary Care Physician:COLLINS, Hinton Dyer, DO  Referring Physician: [name], DO [address] [place],  [id number]  Chief complaint:  Crohn's disease, pancreatic insufficiency  Past GI History: severe Crohn's disease of the small bowel and the colon s/p Terminal ileal resection in 2008 and 2012 on chronic steroid dependence and chronic diarrhea She was treated in the past with the Remicade and Humira, discontinued due to worsening of psoriasis and no significant improvement in Crohn's disease symptoms Started on Entyvio infusion in June 1135  HPI:  55 year old white female with h/o Crohn's disease is here for follow up visit.  She is currently on Entyvio every 8 weeks. She was started on Creon in February 2017 after she was noted to have increased fecal fat suggestive of pancreatic insufficiency . She reports significant improvement in diarrhea after she started Creon , or at least having only 3-4 bowel movements a day and stool is more formed . No longer taking Lomotil . She has gained about 30-35 pounds in the past few months and reports increased appetite after she started taking Creon. Denies any  vomiting,  melena or bright red blood per rectum. She continues to have intermittent abdominal pain predominantly in the mid abdomen and occasional nausea. She is following with pain management clinic for chronic pain control. She ran out of her refills 2 months ago, and she went to her pharmacy at [facility] was advised to make a follow-up visit with our office. Our office was not sent any refill request by [facility] or patient did not contact the office for refills.she has some old medication that she has been taking   Last colonoscopy in March 2016 showed tubular adenoma, focal active ileitis and colitis on random biopsies. No evidence of dysplasia    Outpatient Encounter Prescriptions as of 09/14/2015  Medication Sig    . albuterol (PROAIR HFA) 108 (90 BASE) MCG/ACT inhaler Inhale 2 puffs into the lungs every 4 (four) hours as needed for wheezing or shortness of breath.   . budesonide-formoterol (SYMBICORT) 80-4.5 MCG/ACT inhaler Inhale 2 puffs into the lungs 2 (two) times daily as needed (wheezing).  . cyanocobalamin (,VITAMIN B-12,) 1000 MCG/ML injection Inject 1 mL (1,000 mcg total) into the skin every 30 (thirty) days.  Marland Kitchen econazole nitrate 1 % cream Apply topically daily.  Marland Kitchen gabapentin (NEURONTIN) 400 MG capsule   . HYDROcodone-acetaminophen (NORCO) 10-325 MG tablet   . hydrocortisone (ANUSOL-HC) 25 MG suppository Insert 1 suppository into rectum twice daily x 1 week, then insert 1 suppository per rectum once every night thereafter until gone.  . lipase/protease/amylase (CREON) 36000 UNITS CPEP capsule Take 2 PO with each meal three times daily and 1 with snack  . Needles & Syringes MISC 1 Syringe by Does not apply route every 30 (thirty) days.  . pramoxine-hydrocortisone (ANALPRAM HC) cream Apply 1 application topically 4 (four) times daily.  . promethazine (PHENERGAN) 25 MG suppository Place 1 suppository (25 mg total) rectally every 6 (six) hours as needed for nausea or vomiting.  . promethazine (PHENERGAN) 25 MG tablet TAKE ONE TABLET BY MOUTH EVERY 8 HOURS AS NEEDED FOR NAUSEA AND VOMITING  . ranitidine (ZANTAC) 150 MG tablet Take 2 tablets (300 mg total) by mouth at bedtime.  . Vedolizumab (ENTYVIO IV) Inject into the vein every 8 (eight) weeks.  . [DISCONTINUED] azaTHIOprine (IMURAN) 50 MG tablet Take 2  tablets (100 mg total) by mouth every morning.  . [DISCONTINUED] budesonide (ENTOCORT EC) 3 MG 24 hr capsule Take 3 capsules (9 mg total) by mouth daily.  . [DISCONTINUED] econazole nitrate 1 % cream Apply topically daily.  . [DISCONTINUED] lipase/protease/amylase (CREON) 36000 UNITS CPEP capsule Take 2 PO with each meal three times daily and 1 with snack  . [DISCONTINUED] omeprazole (PRILOSEC) 20 MG  capsule Take 1 capsule (20 mg total) by mouth 2 (two) times daily before a meal. (Patient taking differently: Take 20 mg by mouth daily. )  . [DISCONTINUED] promethazine (PHENERGAN) 25 MG suppository Place 1 suppository (25 mg total) rectally every 6 (six) hours as needed for nausea or vomiting.  . [DISCONTINUED] ranitidine (ZANTAC) 150 MG tablet Take 2 tablets (300 mg total) by mouth at bedtime.  . cyanocobalamin (,VITAMIN B-12,) 1000 MCG/ML injection Inject 1 mL (1,000 mcg total) into the muscle every 30 (thirty) days.  Marland Kitchen omeprazole (PRILOSEC) 40 MG capsule Take 1 capsule (40 mg total) by mouth daily.  . pramoxine-hydrocortisone (ANALPRAM HC) cream Apply topically 3 (three) times daily.  . [DISCONTINUED] ALPRAZolam (XANAX) 0.5 MG tablet Take 1 tablet (0.5 mg total) by mouth 2 (two) times daily. (Patient not taking: Reported on 05/29/2015)   No facility-administered encounter medications on file as of 09/14/2015.    Allergies as of 09/14/2015 - Review Complete 09/14/2015  Allergen Reaction Noted  . Codeine Hives and Nausea And Vomiting 08/31/2007  . Humira [adalimumab] Rash 12/24/2013    Past Medical History  Diagnosis Date  . Crohn's disease ([place])   . Depression   . GERD (gastroesophageal reflux disease)   . Vitamin B12 deficiency   . Psoriasis     SKIN  . Family history of malignant neoplasm of gastrointestinal tract   . Crohn's colitis ([place])   . PONV (postoperative nausea and vomiting)   . Hypertension   . H/O hiatal hernia   . Seasonal asthma   . S/P dilatation of esophageal stricture   . History of adenomatous polyp of colon   . History of small bowel obstruction     SECONDARY CROHN'S STRICTURE  S/P COLECTOMY  . History of gastritis     AND HX ILEITIS  . Chronic diarrhea   . Internal hemorrhoids   . History of kidney stones   . Right ureteral stone   . Hypopotassemia   . Chronic disease anemia   . Urgency of urination   . Arthritis   . Hyperlipidemia   . Rotavirus  infection 05/31/2013  . Kidney stones     Past Surgical History  Procedure Laterality Date  . Carpal tunnel release Right 2000  . Anal fissure repair  1990's  . Cystoscopy with retrograde pyelogram, ureteroscopy and stent placement Left 03/08/2013    Procedure: CYSTOSCOPY WITH RETROGRADE PYELOGRAM, URETEROSCOPY AND STENT PLACEMENT stone basketing;  Surgeon: Alexis Frock, MD;  Location: [facility];  Service: Urology;  Laterality: Left;  . Vaginal hysterectomy  1992  . Cholecystectomy  2002  . Laparoscopic ileocecectomy  10-09-2008    AND APPENDECTOMY (TERMINAL ILEITIS & PARTIAL SBO)  . Dx laparoscopy converted to open right colecctomy  09-15-2010    ANASTOMOTIC STRICTURE  . Cystoscopy with retrograde pyelogram, ureteroscopy and stent placement Right 04/03/2013    Procedure: [place], URETEROSCOPY AND STENT PLACEMENT;  Surgeon: Alexis Frock, MD;  Location: [facility];  Service: Urology;  Laterality: Right;    Family History  Problem Relation Age of Onset  .  Colon cancer Mother   . Breast cancer Paternal Grandmother   . Colon polyps Mother   . Colon polyps Father   . Colon polyps Brother   . Thyroid disease Neg Hx   . Lung cancer Father     Social History   Social History  . Marital Status: Married    Spouse Name: N/A  . Number of Children: 2  . Years of Education: N/A   Occupational History  . cook    Social History Main Topics  . Smoking status: Former Smoker -- 1.00 packs/day for 10 years    Types: Cigarettes    Quit date: 03/07/1984  . Smokeless tobacco: Never Used  . Alcohol Use: No  . Drug Use: No  . Sexual Activity: Not on file   Other Topics Concern  . Not on file   Social History Narrative      Review of systems: Review of Systems  Constitutional: Negative for fever and chills.  HENT: Negative.   Eyes: Negative for blurred vision.  Respiratory: Negative for cough, shortness of breath and wheezing.     Cardiovascular: Negative for chest pain and palpitations.  Gastrointestinal: as per HPI Genitourinary: Negative for dysuria, urgency, frequency and hematuria.  Musculoskeletal: Negative for myalgias, back pain and joint pain.  Skin: Negative for itching and rash.  Neurological: Negative for dizziness, tremors, focal weakness, seizures and loss of consciousness.  Endo/Heme/Allergies: Negative for environmental allergies.  Psychiatric/Behavioral: Negative for depression, suicidal ideas and hallucinations.  All other systems reviewed and are negative.   Physical Exam: Filed Vitals:   09/14/15 0908  BP: 146/84  Pulse: 94   Gen:      No acute distress HEENT:  EOMI, sclera anicteric Neck:     No masses; no thyromegaly Lungs:    Clear to auscultation bilaterally; normal respiratory effort CV:         Regular rate and rhythm; no murmurs Abd:      + bowel sounds; soft, non-tender; no palpable masses, no distension Ext:    No edema; adequate peripheral perfusion Skin:      Warm and dry; no rash Neuro: alert and oriented x 3 Psych: normal mood and affect  Data Reviewed:  Reviewed chart in epic   Assessment and Plan/Recommendations: 55 year old female with ileocolonic Crohn's disease here for follow-up visit Crohn's disease seems to be well-controlled on current regimen with Entyvio every 8 weeks We'll check ESR, CRP, CBC, BMP, LFT, iron panel, B12 and folate level Discontinue budesonide and azathioprine Chronic diarrhea likely multifactorial in the setting of Crohn's disease and also pancreatic insufficiency, has significantly improved with Creon Continue current dose for Creon Chronic GERD: Protonix 40 mg daily,30 minutes before breakfast and ranitidine at bedtime as needed Follow-up with pain management clinic for chronic narcotic prescriptions for chronic pain control Due for surveillance colonoscopy in March 2018 Advise patient to watch her calorie intake, diet and excise to  prevent further weight gain. Discussed in detail with patient to contact us if she runs out of medications in the future and does not necessarily have to come in for an office visit to get her refills given she is established with our practice and is having regular follow-up visits Return in 6 months  K. Denzil Magnuson , MD [phone] Mon-Fri 8a-5p [phone] after 5p, weekends, holidays  CC: [name], DO

## 2015-09-15 ENCOUNTER — Other Ambulatory Visit: Payer: Self-pay

## 2015-09-15 DIAGNOSIS — R945 Abnormal results of liver function studies: Secondary | ICD-10-CM

## 2015-09-15 DIAGNOSIS — Z79899 Other long term (current) drug therapy: Secondary | ICD-10-CM

## 2015-09-18 ENCOUNTER — Telehealth: Payer: Self-pay | Admitting: *Deleted

## 2015-09-18 NOTE — Telephone Encounter (Signed)
Dr Silverio Decamp insurance will no longer  cover patients anal pram cream, what is another alternative we can send for her ?

## 2015-09-18 NOTE — Telephone Encounter (Signed)
Ok I sent in prior authorization for the analparm so waiting to see if its covered

## 2015-09-18 NOTE — Telephone Encounter (Signed)
Can we try anusol, if not covered as well, please send Rx for hydrocortisone rectal cream 1%

## 2015-09-21 DIAGNOSIS — Z79899 Other long term (current) drug therapy: Secondary | ICD-10-CM | POA: Diagnosis not present

## 2015-09-21 DIAGNOSIS — M47817 Spondylosis without myelopathy or radiculopathy, lumbosacral region: Secondary | ICD-10-CM | POA: Diagnosis not present

## 2015-09-21 DIAGNOSIS — M25559 Pain in unspecified hip: Secondary | ICD-10-CM | POA: Diagnosis not present

## 2015-09-21 DIAGNOSIS — M5137 Other intervertebral disc degeneration, lumbosacral region: Secondary | ICD-10-CM | POA: Diagnosis not present

## 2015-09-21 DIAGNOSIS — Z79891 Long term (current) use of opiate analgesic: Secondary | ICD-10-CM | POA: Diagnosis not present

## 2015-09-21 DIAGNOSIS — G894 Chronic pain syndrome: Secondary | ICD-10-CM | POA: Diagnosis not present

## 2015-09-22 ENCOUNTER — Ambulatory Visit (HOSPITAL_COMMUNITY): Payer: Medicare Other

## 2015-09-22 ENCOUNTER — Telehealth: Payer: Self-pay

## 2015-09-22 NOTE — Telephone Encounter (Signed)
-----  Message from Mauri Pole, MD sent at 09/14/2015  5:12 PM EDT ----- AST/ALT and Alk phos elevated significantly , could be related to patient taking some of the old medications. Please advise her to throw away any old medication. Avoid NSAID /Tylenol. She is on chronic narcotics, please also advise her to stop taking any narcotics with Tylenol. Will need to recheck LFT in one week

## 2015-09-22 NOTE — Telephone Encounter (Signed)
Patient is aware of her results.  Called Dr Andree Elk office and her lab results are faxed. Requested a call back to discuss changing the pain medication to a medicine that does not contain Tylenol.

## 2015-09-23 MED ORDER — HYDROCORTISONE 1 % RE CREA: TOPICAL_CREAM | RECTAL | Status: DC

## 2015-09-23 MED ORDER — HYDROCORTISONE 1 % EX OINT: 1.0000 "application " | TOPICAL_OINTMENT | Freq: Two times a day (BID) | CUTANEOUS | Status: DC

## 2015-09-23 NOTE — Telephone Encounter (Signed)
Called to patient's cell phone. No answer. No voicemail.

## 2015-09-23 NOTE — Telephone Encounter (Signed)
Called Dr Andree Elk office again this morning and left 2nd message in the nurse triage voicemail [name]) requesting a call back today.

## 2015-09-23 NOTE — Telephone Encounter (Signed)
Analpram denied  Sent in hydrocortisone cream

## 2015-09-24 ENCOUNTER — Other Ambulatory Visit: Payer: Self-pay

## 2015-09-24 DIAGNOSIS — K50919 Crohn's disease, unspecified, with unspecified complications: Secondary | ICD-10-CM

## 2015-09-28 NOTE — Telephone Encounter (Signed)
Medication for her pain was changed to Oxycodone. She is aware to avoid all tylenol containing medications and all NSAIDS. Return this week for repeat LFT's.

## 2015-09-29 ENCOUNTER — Encounter (HOSPITAL_COMMUNITY)
Admission: RE | Admit: 2015-09-29 | Discharge: 2015-09-29 | Disposition: A | Discharge status: Home / Self Care | Payer: BLUE CROSS/BLUE SHIELD | Source: Ambulatory Visit | Attending: Gastroenterology | Admitting: Gastroenterology

## 2015-09-29 ENCOUNTER — Encounter (HOSPITAL_COMMUNITY): Payer: Self-pay

## 2015-09-29 DIAGNOSIS — K50119 Crohn's disease of large intestine with unspecified complications: Secondary | ICD-10-CM | POA: Insufficient documentation

## 2015-09-29 DIAGNOSIS — K50919 Crohn's disease, unspecified, with unspecified complications: Secondary | ICD-10-CM

## 2015-09-29 MED ORDER — VEDOLIZUMAB 300 MG IV SOLR
300.0000 mg | Freq: Once | INTRAVENOUS | Status: AC
  Administered 2015-09-29: 300 mg via INTRAVENOUS
  Filled 2015-09-29: qty 5

## 2015-09-29 MED ORDER — SODIUM CHLORIDE 0.9 % IV SOLN
INTRAVENOUS | Status: DC
  Administered 2015-09-29: 11:00:00 via INTRAVENOUS

## 2015-09-30 ENCOUNTER — Other Ambulatory Visit: Payer: Self-pay | Admitting: Gastroenterology

## 2015-10-02 DIAGNOSIS — M4696 Unspecified inflammatory spondylopathy, lumbar region: Secondary | ICD-10-CM | POA: Diagnosis not present

## 2015-10-02 DIAGNOSIS — M5137 Other intervertebral disc degeneration, lumbosacral region: Secondary | ICD-10-CM | POA: Diagnosis not present

## 2015-10-02 DIAGNOSIS — M47817 Spondylosis without myelopathy or radiculopathy, lumbosacral region: Secondary | ICD-10-CM | POA: Diagnosis not present

## 2015-10-02 DIAGNOSIS — M1288 Other specific arthropathies, not elsewhere classified, other specified site: Secondary | ICD-10-CM | POA: Diagnosis not present

## 2015-10-04 ENCOUNTER — Other Ambulatory Visit: Payer: Self-pay | Admitting: Gastroenterology

## 2015-10-12 ENCOUNTER — Encounter: Payer: Self-pay | Admitting: Gastroenterology

## 2015-10-19 DIAGNOSIS — M5137 Other intervertebral disc degeneration, lumbosacral region: Secondary | ICD-10-CM | POA: Diagnosis not present

## 2015-10-19 DIAGNOSIS — Z79891 Long term (current) use of opiate analgesic: Secondary | ICD-10-CM | POA: Diagnosis not present

## 2015-10-19 DIAGNOSIS — G894 Chronic pain syndrome: Secondary | ICD-10-CM | POA: Diagnosis not present

## 2015-10-19 DIAGNOSIS — M47817 Spondylosis without myelopathy or radiculopathy, lumbosacral region: Secondary | ICD-10-CM | POA: Diagnosis not present

## 2015-10-19 DIAGNOSIS — M25559 Pain in unspecified hip: Secondary | ICD-10-CM | POA: Diagnosis not present

## 2015-10-19 DIAGNOSIS — Z79899 Other long term (current) drug therapy: Secondary | ICD-10-CM | POA: Diagnosis not present

## 2015-11-18 ENCOUNTER — Other Ambulatory Visit: Payer: Self-pay

## 2015-11-18 DIAGNOSIS — M25559 Pain in unspecified hip: Secondary | ICD-10-CM | POA: Diagnosis not present

## 2015-11-18 DIAGNOSIS — G894 Chronic pain syndrome: Secondary | ICD-10-CM | POA: Diagnosis not present

## 2015-11-18 DIAGNOSIS — M1288 Other specific arthropathies, not elsewhere classified, other specified site: Secondary | ICD-10-CM | POA: Diagnosis not present

## 2015-11-18 DIAGNOSIS — Z79899 Other long term (current) drug therapy: Secondary | ICD-10-CM | POA: Diagnosis not present

## 2015-11-18 DIAGNOSIS — Z79891 Long term (current) use of opiate analgesic: Secondary | ICD-10-CM | POA: Diagnosis not present

## 2015-11-18 DIAGNOSIS — M7060 Trochanteric bursitis, unspecified hip: Secondary | ICD-10-CM | POA: Diagnosis not present

## 2015-11-18 DIAGNOSIS — M5137 Other intervertebral disc degeneration, lumbosacral region: Secondary | ICD-10-CM | POA: Diagnosis not present

## 2015-11-18 DIAGNOSIS — K5000 Crohn's disease of small intestine without complications: Secondary | ICD-10-CM

## 2015-11-20 NOTE — Addendum Note (Signed)
Addended by: Kyra Leyland E on: 11/20/2015 04:29 PM   Modules accepted: Orders

## 2015-11-24 ENCOUNTER — Encounter (HOSPITAL_COMMUNITY): Payer: Self-pay

## 2015-11-24 ENCOUNTER — Encounter (HOSPITAL_COMMUNITY)
Admission: RE | Admit: 2015-11-24 | Discharge: 2015-11-24 | Disposition: A | Discharge status: Home / Self Care | Payer: BLUE CROSS/BLUE SHIELD | Source: Ambulatory Visit | Attending: Gastroenterology | Admitting: Gastroenterology

## 2015-11-24 DIAGNOSIS — K5000 Crohn's disease of small intestine without complications: Secondary | ICD-10-CM

## 2015-11-24 DIAGNOSIS — K50119 Crohn's disease of large intestine with unspecified complications: Secondary | ICD-10-CM | POA: Diagnosis not present

## 2015-11-24 MED ORDER — VEDOLIZUMAB 300 MG IV SOLR
300.0000 mg | Freq: Once | INTRAVENOUS | Status: AC
  Administered 2015-11-24: 300 mg via INTRAVENOUS
  Filled 2015-11-24: qty 5

## 2015-11-24 MED ORDER — SODIUM CHLORIDE 0.9 % IV SOLN
Freq: Once | INTRAVENOUS | Status: AC
  Administered 2015-11-24: 09:00:00 via INTRAVENOUS

## 2015-12-16 DIAGNOSIS — M5137 Other intervertebral disc degeneration, lumbosacral region: Secondary | ICD-10-CM | POA: Diagnosis not present

## 2015-12-16 DIAGNOSIS — Z79891 Long term (current) use of opiate analgesic: Secondary | ICD-10-CM | POA: Diagnosis not present

## 2015-12-16 DIAGNOSIS — Z79899 Other long term (current) drug therapy: Secondary | ICD-10-CM | POA: Diagnosis not present

## 2015-12-16 DIAGNOSIS — M47817 Spondylosis without myelopathy or radiculopathy, lumbosacral region: Secondary | ICD-10-CM | POA: Diagnosis not present

## 2015-12-16 DIAGNOSIS — G894 Chronic pain syndrome: Secondary | ICD-10-CM | POA: Diagnosis not present

## 2015-12-16 DIAGNOSIS — M4696 Unspecified inflammatory spondylopathy, lumbar region: Secondary | ICD-10-CM | POA: Diagnosis not present

## 2015-12-18 ENCOUNTER — Other Ambulatory Visit: Payer: Self-pay | Admitting: Gastroenterology

## 2015-12-31 DIAGNOSIS — M47817 Spondylosis without myelopathy or radiculopathy, lumbosacral region: Secondary | ICD-10-CM | POA: Diagnosis not present

## 2016-01-13 ENCOUNTER — Other Ambulatory Visit: Payer: Self-pay

## 2016-01-13 DIAGNOSIS — K5010 Crohn's disease of large intestine without complications: Secondary | ICD-10-CM

## 2016-01-14 DIAGNOSIS — M5137 Other intervertebral disc degeneration, lumbosacral region: Secondary | ICD-10-CM | POA: Diagnosis not present

## 2016-01-14 DIAGNOSIS — M4696 Unspecified inflammatory spondylopathy, lumbar region: Secondary | ICD-10-CM | POA: Diagnosis not present

## 2016-01-14 DIAGNOSIS — Z79891 Long term (current) use of opiate analgesic: Secondary | ICD-10-CM | POA: Diagnosis not present

## 2016-01-14 DIAGNOSIS — G894 Chronic pain syndrome: Secondary | ICD-10-CM | POA: Diagnosis not present

## 2016-01-14 DIAGNOSIS — Z79899 Other long term (current) drug therapy: Secondary | ICD-10-CM | POA: Diagnosis not present

## 2016-01-14 DIAGNOSIS — M47817 Spondylosis without myelopathy or radiculopathy, lumbosacral region: Secondary | ICD-10-CM | POA: Diagnosis not present

## 2016-01-19 ENCOUNTER — Encounter (HOSPITAL_COMMUNITY): Payer: Self-pay

## 2016-01-19 ENCOUNTER — Encounter (HOSPITAL_COMMUNITY)
Admission: RE | Admit: 2016-01-19 | Discharge: 2016-01-19 | Disposition: A | Discharge status: Home / Self Care | Payer: BLUE CROSS/BLUE SHIELD | Source: Ambulatory Visit | Attending: Gastroenterology | Admitting: Gastroenterology

## 2016-01-19 DIAGNOSIS — K50119 Crohn's disease of large intestine with unspecified complications: Secondary | ICD-10-CM | POA: Insufficient documentation

## 2016-01-19 DIAGNOSIS — K5010 Crohn's disease of large intestine without complications: Secondary | ICD-10-CM

## 2016-01-19 MED ORDER — SODIUM CHLORIDE 0.9 % IV SOLN
INTRAVENOUS | Status: DC
  Administered 2016-01-19: 10:00:00 via INTRAVENOUS

## 2016-01-19 MED ORDER — VEDOLIZUMAB 300 MG IV SOLR
300.0000 mg | Freq: Once | INTRAVENOUS | Status: AC
  Administered 2016-01-19: 300 mg via INTRAVENOUS
  Filled 2016-01-19: qty 5

## 2016-01-22 ENCOUNTER — Other Ambulatory Visit: Payer: Self-pay | Admitting: Gastroenterology

## 2016-01-22 ENCOUNTER — Encounter: Payer: Self-pay | Admitting: Neurology

## 2016-01-22 DIAGNOSIS — R29898 Other symptoms and signs involving the musculoskeletal system: Secondary | ICD-10-CM | POA: Diagnosis not present

## 2016-01-22 DIAGNOSIS — R51 Headache: Secondary | ICD-10-CM | POA: Diagnosis not present

## 2016-01-22 DIAGNOSIS — R946 Abnormal results of thyroid function studies: Secondary | ICD-10-CM | POA: Diagnosis not present

## 2016-01-22 DIAGNOSIS — R441 Visual hallucinations: Secondary | ICD-10-CM | POA: Diagnosis not present

## 2016-01-22 DIAGNOSIS — Z131 Encounter for screening for diabetes mellitus: Secondary | ICD-10-CM | POA: Diagnosis not present

## 2016-01-22 DIAGNOSIS — R030 Elevated blood-pressure reading, without diagnosis of hypertension: Secondary | ICD-10-CM | POA: Diagnosis not present

## 2016-01-25 DIAGNOSIS — R51 Headache: Secondary | ICD-10-CM | POA: Diagnosis not present

## 2016-01-25 DIAGNOSIS — R443 Hallucinations, unspecified: Secondary | ICD-10-CM | POA: Diagnosis not present

## 2016-02-08 ENCOUNTER — Encounter: Payer: Self-pay | Admitting: Gastroenterology

## 2016-02-11 DIAGNOSIS — M5137 Other intervertebral disc degeneration, lumbosacral region: Secondary | ICD-10-CM | POA: Diagnosis not present

## 2016-02-11 DIAGNOSIS — G894 Chronic pain syndrome: Secondary | ICD-10-CM | POA: Diagnosis not present

## 2016-02-11 DIAGNOSIS — Z79891 Long term (current) use of opiate analgesic: Secondary | ICD-10-CM | POA: Diagnosis not present

## 2016-02-11 DIAGNOSIS — M4696 Unspecified inflammatory spondylopathy, lumbar region: Secondary | ICD-10-CM | POA: Diagnosis not present

## 2016-02-11 DIAGNOSIS — Z79899 Other long term (current) drug therapy: Secondary | ICD-10-CM | POA: Diagnosis not present

## 2016-02-11 DIAGNOSIS — M47817 Spondylosis without myelopathy or radiculopathy, lumbosacral region: Secondary | ICD-10-CM | POA: Diagnosis not present

## 2016-02-12 DIAGNOSIS — R4182 Altered mental status, unspecified: Secondary | ICD-10-CM | POA: Diagnosis not present

## 2016-02-12 DIAGNOSIS — R51 Headache: Secondary | ICD-10-CM | POA: Diagnosis not present

## 2016-02-12 DIAGNOSIS — Z6828 Body mass index (BMI) 28.0-28.9, adult: Secondary | ICD-10-CM | POA: Diagnosis not present

## 2016-02-20 ENCOUNTER — Emergency Department (HOSPITAL_COMMUNITY): Payer: BLUE CROSS/BLUE SHIELD

## 2016-02-20 ENCOUNTER — Encounter (HOSPITAL_COMMUNITY): Payer: Self-pay | Admitting: Emergency Medicine

## 2016-02-20 ENCOUNTER — Inpatient Hospital Stay (HOSPITAL_COMMUNITY)
Admission: EM | Admit: 2016-02-20 | Discharge: 2016-02-24 | DRG: 387 | Disposition: A | Discharge status: Home / Self Care | Payer: BLUE CROSS/BLUE SHIELD | Attending: Internal Medicine | Admitting: Internal Medicine

## 2016-02-20 DIAGNOSIS — R112 Nausea with vomiting, unspecified: Secondary | ICD-10-CM | POA: Diagnosis not present

## 2016-02-20 DIAGNOSIS — K5010 Crohn's disease of large intestine without complications: Secondary | ICD-10-CM | POA: Diagnosis not present

## 2016-02-20 DIAGNOSIS — G8929 Other chronic pain: Secondary | ICD-10-CM | POA: Diagnosis present

## 2016-02-20 DIAGNOSIS — R1032 Left lower quadrant pain: Secondary | ICD-10-CM

## 2016-02-20 DIAGNOSIS — A084 Viral intestinal infection, unspecified: Secondary | ICD-10-CM | POA: Insufficient documentation

## 2016-02-20 DIAGNOSIS — Z79899 Other long term (current) drug therapy: Secondary | ICD-10-CM

## 2016-02-20 DIAGNOSIS — Z87442 Personal history of urinary calculi: Secondary | ICD-10-CM | POA: Diagnosis not present

## 2016-02-20 DIAGNOSIS — E785 Hyperlipidemia, unspecified: Secondary | ICD-10-CM | POA: Diagnosis present

## 2016-02-20 DIAGNOSIS — K529 Noninfective gastroenteritis and colitis, unspecified: Secondary | ICD-10-CM | POA: Diagnosis not present

## 2016-02-20 DIAGNOSIS — Z23 Encounter for immunization: Secondary | ICD-10-CM | POA: Diagnosis not present

## 2016-02-20 DIAGNOSIS — I10 Essential (primary) hypertension: Secondary | ICD-10-CM | POA: Diagnosis not present

## 2016-02-20 DIAGNOSIS — K50919 Crohn's disease, unspecified, with unspecified complications: Secondary | ICD-10-CM | POA: Diagnosis not present

## 2016-02-20 DIAGNOSIS — K8689 Other specified diseases of pancreas: Secondary | ICD-10-CM | POA: Diagnosis not present

## 2016-02-20 DIAGNOSIS — J45909 Unspecified asthma, uncomplicated: Secondary | ICD-10-CM | POA: Diagnosis present

## 2016-02-20 DIAGNOSIS — K5080 Crohn's disease of both small and large intestine without complications: Secondary | ICD-10-CM | POA: Diagnosis not present

## 2016-02-20 DIAGNOSIS — K219 Gastro-esophageal reflux disease without esophagitis: Secondary | ICD-10-CM | POA: Diagnosis present

## 2016-02-20 DIAGNOSIS — E876 Hypokalemia: Secondary | ICD-10-CM | POA: Diagnosis present

## 2016-02-20 DIAGNOSIS — D638 Anemia in other chronic diseases classified elsewhere: Secondary | ICD-10-CM | POA: Diagnosis present

## 2016-02-20 DIAGNOSIS — L409 Psoriasis, unspecified: Secondary | ICD-10-CM | POA: Diagnosis present

## 2016-02-20 DIAGNOSIS — R109 Unspecified abdominal pain: Secondary | ICD-10-CM | POA: Diagnosis present

## 2016-02-20 DIAGNOSIS — Z87891 Personal history of nicotine dependence: Secondary | ICD-10-CM

## 2016-02-20 DIAGNOSIS — Z79891 Long term (current) use of opiate analgesic: Secondary | ICD-10-CM

## 2016-02-20 DIAGNOSIS — F329 Major depressive disorder, single episode, unspecified: Secondary | ICD-10-CM | POA: Diagnosis not present

## 2016-02-20 DIAGNOSIS — Z9049 Acquired absence of other specified parts of digestive tract: Secondary | ICD-10-CM

## 2016-02-20 DIAGNOSIS — K5090 Crohn's disease, unspecified, without complications: Secondary | ICD-10-CM | POA: Diagnosis present

## 2016-02-20 LAB — COMPREHENSIVE METABOLIC PANEL
ALBUMIN: 3.7 g/dL (ref 3.5–5.0)
ALK PHOS: 107 U/L (ref 38–126)
ALT: 29 U/L (ref 14–54)
ANION GAP: 10 (ref 5–15)
AST: 24 U/L (ref 15–41)
BUN: 8 mg/dL (ref 6–20)
CALCIUM: 8.9 mg/dL (ref 8.9–10.3)
CO2: 26 mmol/L (ref 22–32)
Chloride: 107 mmol/L (ref 101–111)
Creatinine, Ser: 0.83 mg/dL (ref 0.44–1.00)
GFR calc non Af Amer: >60 mL/min (ref >60)
GLUCOSE: 107 mg/dL — AB (ref 65–99)
POTASSIUM: 2.6 mmol/L — AB (ref 3.5–5.1)
SODIUM: 143 mmol/L (ref 135–145)
TOTAL PROTEIN: 7.3 g/dL (ref 6.5–8.1)
Total Bilirubin: 0.9 mg/dL (ref 0.3–1.2)

## 2016-02-20 LAB — C DIFFICILE QUICK SCREEN W PCR REFLEX
C DIFFICILE (CDIFF) INTERP: NOT DETECTED
C DIFFICILE (CDIFF) TOXIN: NEGATIVE
C DIFFICLE (CDIFF) ANTIGEN: NEGATIVE

## 2016-02-20 LAB — URINALYSIS, ROUTINE W REFLEX MICROSCOPIC
Glucose, UA: NEGATIVE mg/dL
Ketones, ur: 15 mg/dL — AB
Leukocytes, UA: NEGATIVE
NITRITE: NEGATIVE
SPECIFIC GRAVITY, URINE: 1.025 (ref 1.005–1.030)
pH: 6 (ref 5.0–8.0)

## 2016-02-20 LAB — BASIC METABOLIC PANEL
Anion gap: 8 (ref 5–15)
BUN: 6 mg/dL (ref 6–20)
CO2: 27 mmol/L (ref 22–32)
CREATININE: 0.77 mg/dL (ref 0.44–1.00)
Calcium: 8.5 mg/dL — ABNORMAL LOW (ref 8.9–10.3)
Chloride: 106 mmol/L (ref 101–111)
GFR calc Af Amer: >60 mL/min (ref >60)
Glucose, Bld: 111 mg/dL — ABNORMAL HIGH (ref 65–99)
Potassium: 2.8 mmol/L — ABNORMAL LOW (ref 3.5–5.1)
SODIUM: 141 mmol/L (ref 135–145)

## 2016-02-20 LAB — MAGNESIUM: MAGNESIUM: 1.5 mg/dL — AB (ref 1.7–2.4)

## 2016-02-20 LAB — CBC
HCT: 35.3 % — ABNORMAL LOW (ref 36.0–46.0)
HEMATOCRIT: 36.9 % (ref 36.0–46.0)
HEMOGLOBIN: 12.8 g/dL (ref 12.0–15.0)
Hemoglobin: 12 g/dL (ref 12.0–15.0)
MCH: 30.7 pg (ref 26.0–34.0)
MCH: 30.3 pg (ref 26.0–34.0)
MCHC: 34.7 g/dL (ref 30.0–36.0)
MCHC: 34 g/dL (ref 30.0–36.0)
MCV: 88.5 fL (ref 78.0–100.0)
MCV: 89.1 fL (ref 78.0–100.0)
PLATELETS: 235 10*3/uL (ref 150–400)
Platelets: 272 10*3/uL (ref 150–400)
RBC: 4.17 MIL/uL (ref 3.87–5.11)
RBC: 3.96 MIL/uL (ref 3.87–5.11)
RDW: 12.5 % (ref 11.5–15.5)
RDW: 12.5 % (ref 11.5–15.5)
WBC: 7.5 10*3/uL (ref 4.0–10.5)
WBC: 6.6 10*3/uL (ref 4.0–10.5)

## 2016-02-20 LAB — URINALYSIS, MICROSCOPIC (REFLEX)
Bacteria, UA: NONE SEEN
WBC, UA: NONE SEEN WBC/hpf (ref 0–5)

## 2016-02-20 LAB — LIPASE, BLOOD: Lipase: 19 U/L (ref 11–51)

## 2016-02-20 MED ORDER — METOCLOPRAMIDE HCL 5 MG/ML IJ SOLN
10.0000 mg | Freq: Once | INTRAMUSCULAR | Status: AC
  Administered 2016-02-20: 10 mg via INTRAVENOUS
  Filled 2016-02-20: qty 2

## 2016-02-20 MED ORDER — ENOXAPARIN SODIUM 40 MG/0.4ML ~~LOC~~ SOLN
40.0000 mg | Freq: Every day | SUBCUTANEOUS | Status: DC
  Administered 2016-02-20 – 2016-02-23 (×4): 40 mg via SUBCUTANEOUS
  Filled 2016-02-20 (×4): qty 0.4

## 2016-02-20 MED ORDER — IOPAMIDOL (ISOVUE-300) INJECTION 61%
INTRAVENOUS | Status: AC
  Administered 2016-02-20: 100 mL via INTRAVENOUS
  Filled 2016-02-20: qty 100

## 2016-02-20 MED ORDER — PROMETHAZINE HCL 25 MG RE SUPP: 25.0000 mg | Freq: Four times a day (QID) | RECTAL | Status: DC | PRN

## 2016-02-20 MED ORDER — OXYCODONE HCL 5 MG PO TABS
5.0000 mg | ORAL_TABLET | ORAL | Status: DC | PRN
  Administered 2016-02-21 – 2016-02-24 (×11): 5 mg via ORAL
  Filled 2016-02-20 (×11): qty 1

## 2016-02-20 MED ORDER — PNEUMOCOCCAL VAC POLYVALENT 25 MCG/0.5ML IJ INJ
0.5000 mL | INJECTION | INTRAMUSCULAR | Status: AC
  Administered 2016-02-21: 0.5 mL via INTRAMUSCULAR
  Filled 2016-02-20 (×2): qty 0.5

## 2016-02-20 MED ORDER — PROMETHAZINE HCL 25 MG PO TABS
12.5000 mg | ORAL_TABLET | Freq: Four times a day (QID) | ORAL | Status: DC | PRN
  Administered 2016-02-21 – 2016-02-22 (×2): 12.5 mg via ORAL
  Filled 2016-02-20 (×2): qty 1

## 2016-02-20 MED ORDER — SODIUM CHLORIDE 0.9 % IV SOLN: INTRAVENOUS | Status: DC

## 2016-02-20 MED ORDER — METRONIDAZOLE 500 MG PO TABS
500.0000 mg | ORAL_TABLET | Freq: Three times a day (TID) | ORAL | Status: DC
  Administered 2016-02-20 – 2016-02-22 (×5): 500 mg via ORAL
  Filled 2016-02-20 (×5): qty 1

## 2016-02-20 MED ORDER — ONDANSETRON HCL 4 MG/2ML IJ SOLN: 4.0000 mg | Freq: Four times a day (QID) | INTRAMUSCULAR | Status: DC | PRN

## 2016-02-20 MED ORDER — SODIUM CHLORIDE 0.9 % IV BOLUS (SEPSIS)
1000.0000 mL | Freq: Once | INTRAVENOUS | Status: AC
  Administered 2016-02-20: 1000 mL via INTRAVENOUS

## 2016-02-20 MED ORDER — HYDROCORTISONE ACE-PRAMOXINE 2.5-1 % RE CREA
1.0000 "application " | TOPICAL_CREAM | Freq: Three times a day (TID) | RECTAL | Status: DC
  Administered 2016-02-20 – 2016-02-23 (×10): 1 via RECTAL
  Filled 2016-02-20: qty 30

## 2016-02-20 MED ORDER — PANTOPRAZOLE SODIUM 40 MG PO TBEC
80.0000 mg | DELAYED_RELEASE_TABLET | Freq: Every day | ORAL | Status: DC
  Administered 2016-02-21 – 2016-02-23 (×3): 80 mg via ORAL
  Filled 2016-02-20 (×4): qty 2

## 2016-02-20 MED ORDER — PROMETHAZINE HCL 25 MG/ML IJ SOLN
25.0000 mg | Freq: Once | INTRAMUSCULAR | Status: AC
  Administered 2016-02-20: 25 mg via INTRAVENOUS
  Filled 2016-02-20: qty 1

## 2016-02-20 MED ORDER — HYDRALAZINE HCL 10 MG PO TABS
10.0000 mg | ORAL_TABLET | Freq: Three times a day (TID) | ORAL | Status: DC | PRN
  Administered 2016-02-21: 10 mg via ORAL
  Filled 2016-02-20 (×2): qty 1

## 2016-02-20 MED ORDER — SODIUM CHLORIDE 0.9 % IJ SOLN
INTRAMUSCULAR | Status: AC
  Filled 2016-02-20: qty 50

## 2016-02-20 MED ORDER — CIPROFLOXACIN IN D5W 400 MG/200ML IV SOLN
400.0000 mg | Freq: Two times a day (BID) | INTRAVENOUS | Status: DC
  Administered 2016-02-21: 400 mg via INTRAVENOUS
  Filled 2016-02-20: qty 200

## 2016-02-20 MED ORDER — MAGNESIUM SULFATE 2 GM/50ML IV SOLN
2.0000 g | Freq: Once | INTRAVENOUS | Status: AC
  Administered 2016-02-20: 2 g via INTRAVENOUS
  Filled 2016-02-20: qty 50

## 2016-02-20 MED ORDER — PROCHLORPERAZINE EDISYLATE 5 MG/ML IJ SOLN
10.0000 mg | Freq: Four times a day (QID) | INTRAMUSCULAR | Status: DC | PRN
  Administered 2016-02-20: 10 mg via INTRAVENOUS
  Filled 2016-02-20: qty 2

## 2016-02-20 MED ORDER — ONDANSETRON HCL 4 MG PO TABS: 4.0000 mg | ORAL_TABLET | Freq: Four times a day (QID) | ORAL | Status: DC | PRN

## 2016-02-20 MED ORDER — SODIUM CHLORIDE 0.9 % IV SOLN
30.0000 meq | Freq: Once | INTRAVENOUS | Status: AC
  Administered 2016-02-20: 30 meq via INTRAVENOUS
  Filled 2016-02-20: qty 15

## 2016-02-20 MED ORDER — SODIUM CHLORIDE 0.9 % IV SOLN
Freq: Once | INTRAVENOUS | Status: AC
  Administered 2016-02-21: via INTRAVENOUS
  Filled 2016-02-20: qty 1000

## 2016-02-20 MED ORDER — ALBUTEROL SULFATE (2.5 MG/3ML) 0.083% IN NEBU: 3.0000 mL | INHALATION_SOLUTION | RESPIRATORY_TRACT | Status: DC | PRN

## 2016-02-20 NOTE — H&P (Addendum)
History and Physical    [name] TKW:[id number] DOB: [birth year] DOA: 02/20/2016  PCP: [name], DO  Patient coming from: Home  Chief Complaint: Nausea, vomiting, diarrhea X 2 days  HPI: [name] is a 55 y.o. female with medical history significant of Asthma, Crohn's disease, HTN, HLD, GERD, psoriasis, kidney stones who presented with N/V/D for 2 days.  She reports that the symptoms started suddenly.  She did not eat any new foods or travel anywhere.  She has no sick contacts and has not recently been on Abx.  She has not been able to tolerate PO and the last thing she was able to take in was Thursday morning.  She has associated symptoms of mild belly pain.  The diarrhea is watery and yellow and the number of movements is too many to count.  The emesis is foamy and yellow.  There is no blood in either.  She has had no chest pain, issues swallowing.  She does not drink well water.  She does have Crohn's disease and this does not seem like a normal flare as pain is not bad and there is no blood in the stool.  She is on immunosuppressant medications including Vedolizumab.   She notes that she is not on anything for her BP and that her BP has been "getting higher" recently.  Her BP in the ED was elevated into the 924Q - 683M systolic.   ED Course: In the ED she was found to have low K of 2.6 and low Mag of 1.5 which were replaced.  WBC was normal.  She had a CT of the abdomen showing likely acute colitis.   Review of Systems: As per HPI otherwise 10 point review of systems negative.   Past Medical History:  Diagnosis Date  . Arthritis   . Chronic diarrhea   . Chronic disease anemia   . Crohn's colitis ([place])   . Crohn's disease ([place])   . Depression   . Family history of malignant neoplasm of gastrointestinal tract   . GERD (gastroesophageal reflux disease)   . H/O hiatal hernia   . History of adenomatous polyp of colon   . History of gastritis    AND HX ILEITIS  .  History of kidney stones   . History of small bowel obstruction    SECONDARY CROHN'S STRICTURE  S/P COLECTOMY  . Hyperlipidemia   . Hypertension   . Hypopotassemia   . Internal hemorrhoids   . Kidney stones   . PONV (postoperative nausea and vomiting)   . Psoriasis    SKIN  . Right ureteral stone   . Rotavirus infection 05/31/2013  . S/P dilatation of esophageal stricture   . Seasonal asthma   . Urgency of urination   . Vitamin B12 deficiency     Past Surgical History:  Procedure Laterality Date  . ANAL FISSURE REPAIR  1990's  . CARPAL TUNNEL RELEASE Right 2000  . CHOLECYSTECTOMY  2002  . CYSTOSCOPY WITH RETROGRADE PYELOGRAM, URETEROSCOPY AND STENT PLACEMENT Left 03/08/2013   Procedure: CYSTOSCOPY WITH RETROGRADE PYELOGRAM, URETEROSCOPY AND STENT PLACEMENT stone basketing;  Surgeon: Alexis Frock, MD;  Location: [facility];  Service: Urology;  Laterality: Left;  . CYSTOSCOPY WITH RETROGRADE PYELOGRAM, URETEROSCOPY AND STENT PLACEMENT Right 04/03/2013   Procedure: CYSTOSCOPY WITH RETROGRADE PYELOGRAM, URETEROSCOPY AND STENT PLACEMENT;  Surgeon: Alexis Frock, MD;  Location: [facility];  Service: Urology;  Laterality: Right;  . DX LAPAROSCOPY CONVERTED TO OPEN RIGHT COLECCTOMY  09-15-2010   ANASTOMOTIC STRICTURE  . LAPAROSCOPIC ILEOCECECTOMY  10-09-2008   AND APPENDECTOMY (TERMINAL ILEITIS & PARTIAL SBO)  . VAGINAL HYSTERECTOMY  1992     reports that she quit smoking about 31 years ago. Her smoking use included Cigarettes. She has a 10.00 pack-year smoking history. She has never used smokeless tobacco. She reports that she does not drink alcohol or use drugs.  Allergies  Allergen Reactions  . Codeine Hives and Nausea And Vomiting  . Humira [Adalimumab] Rash    Family History  Problem Relation Age of Onset  . Colon cancer Mother   . Colon polyps Mother   . Colon polyps Father   . Lung cancer Father   . Breast cancer Paternal Grandmother   . Colon polyps  Brother   . Thyroid disease Neg Hx    Family history reviewed with the patient and confirmed.  Prior to Admission medications   Medication Sig Start Date End Date Taking? Authorizing Provider  albuterol (PROAIR HFA) 108 (90 BASE) MCG/ACT inhaler Inhale 2 puffs into the lungs every 4 (four) hours as needed for wheezing or shortness of breath.     Historical Provider, MD  budesonide-formoterol (SYMBICORT) 80-4.5 MCG/ACT inhaler Inhale 2 puffs into the lungs 2 (two) times daily as needed (wheezing).    Historical Provider, MD  cyanocobalamin (,VITAMIN B-12,) 1000 MCG/ML injection Inject 1 mL (1,000 mcg total) into the skin every 30 (thirty) days. 01/02/15   Mauri Pole, MD         econazole nitrate 1 % cream Apply topically daily. 09/14/15   Mauri Pole, MD  gabapentin (NEURONTIN) 400 MG capsule  03/05/15   Historical Provider, MD         hydrocortisone (ANUSOL-HC) 25 MG suppository Insert 1 suppository into rectum twice daily x 1 week, then insert 1 suppository per rectum once every night thereafter until gone. 03/31/15   Mauri Pole, MD  hydrocortisone (PROCTOCORT) 1 % CREA Use rectally as needed 09/23/15   Mauri Pole, MD                omeprazole (PRILOSEC) 40 MG capsule Take 1 capsule (40 mg total) by mouth daily. 09/14/15   Mauri Pole, MD  oxycodone (OXY-IR) 5 MG capsule Take 5 mg by mouth every 4 (four) hours as needed for pain.    Historical Provider, MD         pramoxine-hydrocortisone (ANALPRAM HC) cream Apply topically 3 (three) times daily. 09/14/15   Mauri Pole, MD  promethazine (PHENERGAN) 25 MG suppository Place 1 suppository (25 mg total) rectally every 6 (six) hours as needed for nausea or vomiting. 09/14/15   Mauri Pole, MD         promethazine (PHENERGAN) 25 MG tablet TAKE ONE TABLET BY MOUTH EVERY 8 HOURS AS NEEDED FOR NAUSEA AND VOMITING 01/22/16   Mauri Pole, MD  ranitidine (ZANTAC) 150 MG tablet Take 2 tablets (300 mg  total) by mouth at bedtime. 09/14/15   Mauri Pole, MD  Vedolizumab (ENTYVIO IV) Inject into the vein every 8 (eight) weeks.    Historical Provider, MD    Physical Exam: Vitals:   02/20/16 1717 02/20/16 1906 02/20/16 1930 02/20/16 2148  BP: (!) 173/112 179/86 175/78 (!) 188/102  Pulse: 115 86 82 88  Resp: 16 11  14   Temp:      TempSrc:      SpO2: 98% 97% 96% 97%  Weight:  Height:          Constitutional: NAD, calm, comfortable Vitals:   02/20/16 1717 02/20/16 1906 02/20/16 1930 02/20/16 2148  BP: (!) 173/112 179/86 175/78 (!) 188/102  Pulse: 115 86 82 88  Resp: 16 11  14   Temp:      TempSrc:      SpO2: 98% 97% 96% 97%  Weight:      Height:       Eyes: PERRL, lids and conjunctivae normal ENMT: Mucous membranes are dry, tongue and mouth are dry. Posterior pharynx clear of any exudate or lesions.  Dentures Neck: normal, supple, no masses Respiratory: clear to auscultation bilaterally, no wheezing, no crackles. Normal respiratory effort. No accessory muscle use.  Cardiovascular: Regular rate and rhythm, no murmurs / rubs / gallops.  Abdomen: TTP in the LLQ, no masses palpated.  Bowel sounds positive.  Musculoskeletal: no clubbing / cyanosis.  Normal muscle tone.  Skin: no rashes, lesions, ulcers. No induration Neurologic: Moving all extremities well, no sensory defect noted Psychiatric: Normal judgment and insight. Alert and oriented x 3. Normal mood.   Labs on Admission: I have personally reviewed following labs and imaging studies  CBC:  Recent Labs Lab 02/20/16 1553  WBC 7.5  HGB 12.8  HCT 36.9  MCV 88.5  PLT 923   Basic Metabolic Panel:  Recent Labs Lab 02/20/16 1553 02/20/16 1750  NA 143  --   K 2.6*  --   CL 107  --   CO2 26  --   GLUCOSE 107*  --   BUN 8  --   CREATININE 0.83  --   CALCIUM 8.9  --   MG  --  1.5*   GFR: Estimated Creatinine Clearance: 82.4 mL/min (by C-G formula based on SCr of 0.83 mg/dL). Liver Function  Tests:  Recent Labs Lab 02/20/16 1553  AST 24  ALT 29  ALKPHOS 107  BILITOT 0.9  PROT 7.3  ALBUMIN 3.7    Recent Labs Lab 02/20/16 1553  LIPASE 19   Urine analysis:    Component Value Date/Time   COLORURINE YELLOW 02/20/2016 1723   APPEARANCEUR CLEAR 02/20/2016 1723   LABSPEC 1.025 02/20/2016 1723   PHURINE 6.0 02/20/2016 1723   GLUCOSEU NEGATIVE 02/20/2016 1723   HGBUR TRACE (A) 02/20/2016 1723   BILIRUBINUR MODERATE (A) 02/20/2016 1723   KETONESUR 15 (A) 02/20/2016 1723   PROTEINUR TRACE (A) 02/20/2016 1723   UROBILINOGEN 0.2 03/29/2013 0919   NITRITE NEGATIVE 02/20/2016 1723   LEUKOCYTESUR NEGATIVE 02/20/2016 1723   Radiological Exams on Admission: Ct Abdomen Pelvis W Contrast  Result Date: 02/20/2016 CLINICAL DATA:  55 y/o F; severe emesis and diarrhea. Left lower quadrant abdominal pain. History of Crohn's disease. EXAM: CT ABDOMEN AND PELVIS WITH CONTRAST TECHNIQUE: Multidetector CT imaging of the abdomen and pelvis was performed using the standard protocol following bolus administration of intravenous contrast. CONTRAST:  138m ISOVUE-300 IOPAMIDOL (ISOVUE-300) INJECTION 61% COMPARISON:  03/29/2013 CT of abdomen and pelvis FINDINGS: Lower chest: No acute abnormality. Hepatobiliary: No focal liver abnormality is seen. Status post cholecystectomy. Stable mild intra and extrahepatic biliary ductal dilatation which is probably compensatory post cholecystectomy. No obstructing stone or lesion identified. Pancreas: Unremarkable. No pancreatic ductal dilatation or surrounding inflammatory changes. Spleen: Normal in size without focal abnormality. Adrenals/Urinary Tract: Left kidney upper pole 7 mm exophytic fluid attenuating structure compatible with cyst. No urinary stone disease. No obstructive uropathy. Normal bladder. Stomach/Bowel: Status post ileal colectomy with anastomosis in the right upper  quadrant. There is diffuse fatty wall thickening throughout the colon and  anastomosis loop of small bowel at the right upper quadrant and tear colonic anastomosis compatible with chronic inflammatory change. A component of acute colitis may be present in the descending segment of colon as there appears to be mild mucosal enhancement (series 4, image 80). There are no obstructive changes of bowel. Vascular/Lymphatic: Aortic atherosclerosis. No enlarged abdominal or pelvic lymph nodes. Reproductive: Status post hysterectomy. No adnexal masses. Other: Ventral midline anterior abdominal wall postsurgical changes with tiny fat containing hernias. Musculoskeletal: Sclerotic changes within the femoral heads bilaterally and subchondral lucency is compatible with avascular necrosis and stable. No acute osseous abnormality is evident. IMPRESSION: 1. Diffuse fatty wall thickening throughout the colon and the anastomotic segment of small bowel is compatible with chronic inflammation. Mild colonic mucosal enhancement most pronounced in the descending segment may represent superimposed acute colitis. 2. No evidence for bowel obstruction. No peritoneal collection or abscess. Electronically Signed   By: Kristine Garbe M.D.   On: 02/20/2016 20:21    EKG: Independently reviewed. Prolonged QTc.  No T wave abnormalities noted.   Assessment/Plan  Acute colitis with nausea, vomiting and abdominal pain - Given her immunosuppressant, and TNTC diarrhea episodes, I think antibiotics are warranted - Will start ciprofloxacin/flagyl - ED sent for Cdiff, if +, consider starting PO vancomycin - IVF with NS at 125cc/hr overnight - NPO - Nausea control with phenergan and/or compazine, hold zofran given QTc prolongation - Repeat EKG in the AM given she will also be on an FQ which can prolong QTc as well - Replete electrolytes as noted below  Hypokalemia, hypomagnesemia - BMET this evening and in the am - Replete K And Mag as needed  GERD - Continue formulary PPI (protonix)    Crohn's  disease ([place]) - Continue home medications of analpram    Asthma - She reports not need inhalers - Hold symbicort for now unless needed - PRN albuterol  Elevated BP - Reviewed PCP notes from last visit, apparently BP has been going up since being on vedolizumab - Monitor BP overnight - Hydralazine for SBP > 200 only - Would not drop BP too fast as has been elevated for at least the last year - Start daily medication once acute illness has abated.   Chronic Pain - Sees [facility] for pain control medications, she reports back and abdominal pain - Continue home dose of Oxycodone IR   DVT prophylaxis: Lovenox  Code Status: Full  Family Communication: No family at bedside  Disposition Plan: To home in 1-2 days  Admission status: Inpatient, Buren Kos MD [facility] Pager [phone]  If 7PM-7AM, please contact night-coverage [email or web address] Password [email or web address]  02/20/2016, 10:07 PM

## 2016-02-20 NOTE — ED Triage Notes (Signed)
Pt c/o severe emesis and diarrhea since Thursday. Pt was referred here by UC concerned after pt getting liter of fluids and phenergan with no relief. Pts husband states she has more output than input the past few days.

## 2016-02-20 NOTE — ED Provider Notes (Signed)
[facility] DEPT Provider Note   CSN: [id number] Arrival date & time: 02/20/16  1508     History   Chief Complaint Chief Complaint  Patient presents with  . Emesis  . Diarrhea    HPI MARIATERESA BATRA is a 55 y.o. female.  HPI SHEBA WHALING is a 55 y.o. female with history of Crohn's colitis with prior bowel resection, arthritis, anemia, hypertension, kidney stones, presents to emergency department complaining nausea vomiting and diarrhea. Patient states symptoms started 2 days ago. States too numerous to count episodes of watery brown diarrhea and clear emesis. She is unable to keep anything down. She went to urgent care and aspirin today where she was given IM 25 mg of Phenergan and 1 L of normal saline and was told to come here. She states that the Phenergan that she received there did not help. This was at 11 AM this morning. She denies any severe abdominal pain. No blood in her stool or emesis. Denies any fever or chills. No recent contact with anyone with similar symptoms. No recent travel. No recent antibiotics. States this does not feel like her Crohn's disease.  Past Medical History:  Diagnosis Date  . Arthritis   . Chronic diarrhea   . Chronic disease anemia   . Crohn's colitis ([place])   . Crohn's disease ([place])   . Depression   . Family history of malignant neoplasm of gastrointestinal tract   . GERD (gastroesophageal reflux disease)   . H/O hiatal hernia   . History of adenomatous polyp of colon   . History of gastritis    AND HX ILEITIS  . History of kidney stones   . History of small bowel obstruction    SECONDARY CROHN'S STRICTURE  S/P COLECTOMY  . Hyperlipidemia   . Hypertension   . Hypopotassemia   . Internal hemorrhoids   . Kidney stones   . PONV (postoperative nausea and vomiting)   . Psoriasis    SKIN  . Right ureteral stone   . Rotavirus infection 05/31/2013  . S/P dilatation of esophageal stricture   . Seasonal asthma   . Urgency of  urination   . Vitamin B12 deficiency     Patient Active Problem List   Diagnosis Date Noted  . Low TSH level 09/26/2013  . Rotavirus infection 05/31/2013  . Protein-calorie malnutrition, severe ([place]) 05/30/2013  . Crohn's disease involving terminal ileum ([place]) 05/28/2013  . Skin lesion 12/14/2012  . Nausea 10/12/2011  . Weight increase 08/31/2010  . Wears dentures 08/31/2010  . Nausea & vomiting  08/31/2010  . IBS (irritable bowel syndrome) 08/31/2010  . Hiatal hernia 08/31/2010  . Reflux 08/31/2010  . Abdominal pain 08/31/2010  . Rectal bleeding 08/31/2010  . Hemorrhoids 08/31/2010  . Sinus problem/runny nose 08/31/2010  . Wears glasses 08/31/2010  . Asthma 08/30/2010  . COUGH 04/02/2010  . CROHN'S DISEASE 02/03/2009  . DIARRHEA 02/03/2009  . VITAMIN B12 DEFICIENCY 12/17/2008  . SBO 09/12/2008  . PSORIASIS 11/07/2007  . DEPRESSION 08/31/2007  . HEARING LOSS 08/31/2007  . GERD 08/31/2007  . HIATAL HERNIA 08/31/2007  . COLONIC POLYPS, ADENOMATOUS, HX OF 08/31/2007    Past Surgical History:  Procedure Laterality Date  . ANAL FISSURE REPAIR  1990's  . CARPAL TUNNEL RELEASE Right 2000  . CHOLECYSTECTOMY  2002  . CYSTOSCOPY WITH RETROGRADE PYELOGRAM, URETEROSCOPY AND STENT PLACEMENT Left 03/08/2013   Procedure: CYSTOSCOPY WITH RETROGRADE PYELOGRAM, URETEROSCOPY AND STENT PLACEMENT stone basketing;  Surgeon: Alexis Frock, MD;  Location:  [facility];  Service: Urology;  Laterality: Left;  . CYSTOSCOPY WITH RETROGRADE PYELOGRAM, URETEROSCOPY AND STENT PLACEMENT Right 04/03/2013   Procedure: CYSTOSCOPY WITH RETROGRADE PYELOGRAM, URETEROSCOPY AND STENT PLACEMENT;  Surgeon: Alexis Frock, MD;  Location: [facility];  Service: Urology;  Laterality: Right;  . DX LAPAROSCOPY CONVERTED TO OPEN RIGHT COLECCTOMY  09-15-2010   ANASTOMOTIC STRICTURE  . LAPAROSCOPIC ILEOCECECTOMY  10-09-2008   AND APPENDECTOMY (TERMINAL ILEITIS & PARTIAL SBO)  . VAGINAL HYSTERECTOMY  1992     OB History    No data available       Home Medications    Prior to Admission medications   Medication Sig Start Date End Date Taking? Authorizing Provider  albuterol (PROAIR HFA) 108 (90 BASE) MCG/ACT inhaler Inhale 2 puffs into the lungs every 4 (four) hours as needed for wheezing or shortness of breath.     Historical Provider, MD  budesonide-formoterol (SYMBICORT) 80-4.5 MCG/ACT inhaler Inhale 2 puffs into the lungs 2 (two) times daily as needed (wheezing).    Historical Provider, MD  cyanocobalamin (,VITAMIN B-12,) 1000 MCG/ML injection Inject 1 mL (1,000 mcg total) into the skin every 30 (thirty) days. 01/02/15   Mauri Pole, MD  cyanocobalamin (,VITAMIN B-12,) 1000 MCG/ML injection Inject 1 mL (1,000 mcg total) into the muscle every 30 (thirty) days. 09/14/15   Mauri Pole, MD  econazole nitrate 1 % cream Apply topically daily. 09/14/15   Mauri Pole, MD  gabapentin (NEURONTIN) 400 MG capsule  03/05/15   Historical Provider, MD  HYDROcodone-acetaminophen [facility]) 10-325 MG tablet  01/04/15   Historical Provider, MD  hydrocortisone (ANUSOL-HC) 25 MG suppository Insert 1 suppository into rectum twice daily x 1 week, then insert 1 suppository per rectum once every night thereafter until gone. 03/31/15   Mauri Pole, MD  hydrocortisone (PROCTOCORT) 1 % CREA Use rectally as needed 09/23/15   Mauri Pole, MD  lipase/protease/amylase (CREON) 36000 UNITS CPEP capsule Take 2 PO with each meal three times daily and 1 with snack 09/14/15   Mauri Pole, MD  Needles & Syringes MISC 1 Syringe by Does not apply route every 30 (thirty) days. 01/02/15   Mauri Pole, MD  omeprazole (PRILOSEC) 40 MG capsule Take 1 capsule (40 mg total) by mouth daily. 09/14/15   Mauri Pole, MD  oxycodone (OXY-IR) 5 MG capsule Take 5 mg by mouth every 4 (four) hours as needed for pain.    Historical Provider, MD  pramoxine-hydrocortisone (ANALPRAM HC) cream Apply 1  application topically 4 (four) times daily. 07/04/14   Lafayette Dragon, MD  pramoxine-hydrocortisone (ANALPRAM HC) cream Apply topically 3 (three) times daily. 09/14/15   Mauri Pole, MD  promethazine (PHENERGAN) 25 MG suppository Place 1 suppository (25 mg total) rectally every 6 (six) hours as needed for nausea or vomiting. 09/14/15   Mauri Pole, MD  promethazine (PHENERGAN) 25 MG tablet TAKE ONE TABLET BY MOUTH EVERY 8 HOURS AS NEEDED FOR NAUSEA/VOMITING 10/05/15   Mauri Pole, MD  promethazine (PHENERGAN) 25 MG tablet TAKE ONE TABLET BY MOUTH EVERY 8 HOURS AS NEEDED FOR NAUSEA AND VOMITING 01/22/16   Mauri Pole, MD  ranitidine (ZANTAC) 150 MG tablet Take 2 tablets (300 mg total) by mouth at bedtime. 09/14/15   Mauri Pole, MD  Vedolizumab (ENTYVIO IV) Inject into the vein every 8 (eight) weeks.    Historical Provider, MD    Family History Family History  Problem Relation Age of  Onset  . Colon cancer Mother   . Colon polyps Mother   . Colon polyps Father   . Lung cancer Father   . Breast cancer Paternal Grandmother   . Colon polyps Brother   . Thyroid disease Neg Hx     Social History Social History  Substance Use Topics  . Smoking status: Former Smoker    Packs/day: 1.00    Years: 10.00    Types: Cigarettes    Quit date: 03/07/1984  . Smokeless tobacco: Never Used  . Alcohol use No     Allergies   Codeine and Humira [adalimumab]   Review of Systems Review of Systems  Constitutional: Negative for chills and fever.  Respiratory: Negative for cough, chest tightness and shortness of breath.   Cardiovascular: Negative for chest pain, palpitations and leg swelling.  Gastrointestinal: Positive for abdominal pain, diarrhea, nausea and vomiting.  Genitourinary: Negative for dysuria, flank pain, pelvic pain, vaginal bleeding, vaginal discharge and vaginal pain.  Musculoskeletal: Negative for arthralgias, myalgias, neck pain and neck stiffness.    Skin: Negative for rash.  Neurological: Positive for weakness. Negative for dizziness and headaches.  All other systems reviewed and are negative.    Physical Exam Updated Vital Signs BP (!) 173/112 (BP Location: Right Arm)   Pulse 115   Temp 98.6 F (37 C) (Oral)   Resp 16   Ht 5' 7"  (1.702 m)   Wt 80.1 kg   SpO2 98%   BMI 27.64 kg/m   Physical Exam  Constitutional: She is oriented to person, place, and time. She appears well-developed and well-nourished.  Actively vomiting.   HENT:  Head: Normocephalic.  Eyes: Conjunctivae are normal.  Neck: Neck supple.  Cardiovascular: Normal rate, regular rhythm and normal heart sounds.   Pulmonary/Chest: Effort normal and breath sounds normal. No respiratory distress. She has no wheezes. She has no rales.  Abdominal: Soft. Bowel sounds are normal. She exhibits no distension. There is no tenderness. There is no rebound.  LLQ tenderness  Musculoskeletal: She exhibits no edema.  Neurological: She is alert and oriented to person, place, and time.  Skin: Skin is warm and dry.  Psychiatric: She has a normal mood and affect. Her behavior is normal.  Nursing note and vitals reviewed.    ED Treatments / Results  Labs (all labs ordered are listed, but only abnormal results are displayed) Labs Reviewed  COMPREHENSIVE METABOLIC PANEL - Abnormal; Notable for the following:       Result Value   Potassium 2.6 (*)    Glucose, Bld 107 (*)    All other components within normal limits  URINALYSIS, ROUTINE W REFLEX MICROSCOPIC - Abnormal; Notable for the following:    Hgb urine dipstick TRACE (*)    Bilirubin Urine MODERATE (*)    Ketones, ur 15 (*)    Protein, ur TRACE (*)    All other components within normal limits  MAGNESIUM - Abnormal; Notable for the following:    Magnesium 1.5 (*)    All other components within normal limits  URINALYSIS, MICROSCOPIC (REFLEX) - Abnormal; Notable for the following:    Squamous Epithelial / LPF 0-5  (*)    All other components within normal limits  CBC - Abnormal; Notable for the following:    HCT 35.3 (*)    All other components within normal limits  BASIC METABOLIC PANEL - Abnormal; Notable for the following:    Potassium 2.8 (*)    Glucose, Bld 111 (*)  Calcium 8.5 (*)    All other components within normal limits  C DIFFICILE QUICK SCREEN W PCR REFLEX  LIPASE, BLOOD  CBC  BASIC METABOLIC PANEL  CBC    EKG  EKG Interpretation None       Radiology Ct Abdomen Pelvis W Contrast  Result Date: 02/20/2016 CLINICAL DATA:  55 y/o F; severe emesis and diarrhea. Left lower quadrant abdominal pain. History of Crohn's disease. EXAM: CT ABDOMEN AND PELVIS WITH CONTRAST TECHNIQUE: Multidetector CT imaging of the abdomen and pelvis was performed using the standard protocol following bolus administration of intravenous contrast. CONTRAST:  148m ISOVUE-300 IOPAMIDOL (ISOVUE-300) INJECTION 61% COMPARISON:  03/29/2013 CT of abdomen and pelvis FINDINGS: Lower chest: No acute abnormality. Hepatobiliary: No focal liver abnormality is seen. Status post cholecystectomy. Stable mild intra and extrahepatic biliary ductal dilatation which is probably compensatory post cholecystectomy. No obstructing stone or lesion identified. Pancreas: Unremarkable. No pancreatic ductal dilatation or surrounding inflammatory changes. Spleen: Normal in size without focal abnormality. Adrenals/Urinary Tract: Left kidney upper pole 7 mm exophytic fluid attenuating structure compatible with cyst. No urinary stone disease. No obstructive uropathy. Normal bladder. 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 There is diffuse fatty wall thickening throughout the colon and anastomosis loop of small bowel at the right upper quadrant and tear colonic anastomosis compatible with chronic inflammatory change. A component of acute colitis may be present in the descending segment of colon  as there appears to be mild mucosal enhancement (series 4, image 80). There are no obstructive changes of bowel. Vascular/Lymphatic: Aortic atherosclerosis. No enlarged abdominal or pelvic lymph nodes. Reproductive: Status post hysterectomy. No adnexal masses. Other: Ventral midline anterior abdominal wall postsurgical changes with tiny fat containing hernias. Musculoskeletal: Sclerotic changes within the femoral heads bilaterally and subchondral lucency is compatible with avascular necrosis and stable. No acute osseous abnormality is evident. IMPRESSION: 1. Diffuse fatty wall thickening throughout the colon and the anastomotic segment of small bowel is compatible with chronic inflammation. Mild colonic mucosal enhancement most pronounced in the descending segment may represent superimposed acute colitis. 2. No evidence for bowel obstruction. No peritoneal collection or abscess. Electronically Signed   By: LKristine GarbeM.D.   On: 02/20/2016 20:21    Procedures Procedures (including critical care time)  Medications Ordered in ED Medications  magnesium sulfate IVPB 2 g 50 mL (not administered)  potassium chloride 30 mEq in sodium chloride 0.9 % 265 mL (KCL MULTIRUN) IVPB (not administered)  sodium chloride 0.9 % bolus 1,000 mL (not administered)  promethazine (PHENERGAN) injection 25 mg (not administered)     Initial Impression / Assessment and Plan / ED Course  I have reviewed the triage vital signs and the nursing notes.  Pertinent labs & imaging results that were available during my care of the patient were reviewed by me and considered in my medical decision making (see chart for details).  Clinical Course    Patient to the emergency department with diarrhea, abdominal pain, nausea and vomiting. Pain is mild and does not feel like her Crohn's disease. States too numerous to count episodes of emesis and diarrhea. Will check labs, will check for C. difficile, urinalysis.  Labs  show potassium of 2.6. Potassium and magnesium ordered IV. CT scan obtained due to patient's prior surgical history and history of Crohn's disease with left lower quadrant tenderness and showed possible colitis. Patient continues to have emesis in the emergency department. She continues to have nausea, states a word  about going home. We'll get her admitted for further treatment and rehydration, as well as potassium repletion.   Spoke with [facility], will admit.   Vitals:   02/20/16 1930 02/20/16 2148 02/20/16 2254 02/20/16 2336  BP: 175/78 (!) 188/102 (!) 177/81 (!) 197/90  Pulse: 82 88 93 91  Resp:  14 16 18   Temp:   98.4 F (36.9 C) 98.3 F (36.8 C)  TempSrc:   Oral Oral  SpO2: 96% 97% 99% 97%  Weight:      Height:        Final Clinical Impressions(s) / ED Diagnoses   Final diagnoses:  Colitis  Hypokalemia    New Prescriptions Current Discharge Medication List       Jeannett Senior, PA-C 02/21/16 0058    Julianne Rice, MD 02/22/16 [phone]

## 2016-02-20 NOTE — ED Notes (Signed)
Notified Yelverten, MD of pt potassium level. Requesting EKG and no other orders at present time.

## 2016-02-21 DIAGNOSIS — K8689 Other specified diseases of pancreas: Secondary | ICD-10-CM

## 2016-02-21 DIAGNOSIS — K529 Noninfective gastroenteritis and colitis, unspecified: Secondary | ICD-10-CM

## 2016-02-21 DIAGNOSIS — K5010 Crohn's disease of large intestine without complications: Secondary | ICD-10-CM

## 2016-02-21 DIAGNOSIS — R197 Diarrhea, unspecified: Secondary | ICD-10-CM

## 2016-02-21 DIAGNOSIS — R112 Nausea with vomiting, unspecified: Secondary | ICD-10-CM

## 2016-02-21 LAB — GASTROINTESTINAL PANEL BY PCR, STOOL (REPLACES STOOL CULTURE)

## 2016-02-21 LAB — CBC
HCT: 34.8 % — ABNORMAL LOW (ref 36.0–46.0)
HEMOGLOBIN: 11.6 g/dL — AB (ref 12.0–15.0)
MCH: 30.2 pg (ref 26.0–34.0)
MCHC: 33.3 g/dL (ref 30.0–36.0)
MCV: 90.6 fL (ref 78.0–100.0)
PLATELETS: 207 10*3/uL (ref 150–400)
RBC: 3.84 MIL/uL — AB (ref 3.87–5.11)
RDW: 12.7 % (ref 11.5–15.5)
WBC: 6 10*3/uL (ref 4.0–10.5)

## 2016-02-21 LAB — IRON AND TIBC
Iron: 51 ug/dL (ref 28–170)
SATURATION RATIOS: 14 % (ref 10.4–31.8)
TIBC: 370 ug/dL (ref 250–450)
UIBC: 319 ug/dL

## 2016-02-21 LAB — FOLATE: FOLATE: 18.2 ng/mL (ref >5.9)

## 2016-02-21 LAB — BASIC METABOLIC PANEL
ANION GAP: 8 (ref 5–15)
BUN: <5 mg/dL — ABNORMAL LOW (ref 6–20)
CALCIUM: 8.1 mg/dL — AB (ref 8.9–10.3)
CO2: 25 mmol/L (ref 22–32)
CREATININE: 0.62 mg/dL (ref 0.44–1.00)
Chloride: 108 mmol/L (ref 101–111)
Glucose, Bld: 112 mg/dL — ABNORMAL HIGH (ref 65–99)
Potassium: 3.1 mmol/L — ABNORMAL LOW (ref 3.5–5.1)
SODIUM: 141 mmol/L (ref 135–145)

## 2016-02-21 LAB — C-REACTIVE PROTEIN: CRP: 1 mg/dL — AB (ref <1.0)

## 2016-02-21 LAB — FERRITIN: FERRITIN: 150 ng/mL (ref 11–307)

## 2016-02-21 LAB — VITAMIN B12: VITAMIN B 12: 242 pg/mL (ref 180–914)

## 2016-02-21 LAB — SEDIMENTATION RATE: SED RATE: 62 mm/h — AB (ref 0–22)

## 2016-02-21 MED ORDER — DEXTROSE 5 % IV SOLN
2.0000 g | INTRAVENOUS | Status: DC
  Administered 2016-02-21 – 2016-02-22 (×2): 2 g via INTRAVENOUS
  Filled 2016-02-21 (×2): qty 2

## 2016-02-21 MED ORDER — AMLODIPINE BESYLATE 5 MG PO TABS
5.0000 mg | ORAL_TABLET | Freq: Every day | ORAL | Status: DC
  Administered 2016-02-21: 5 mg via ORAL
  Filled 2016-02-21: qty 1

## 2016-02-21 MED ORDER — PANCRELIPASE (LIP-PROT-AMYL) 12000-38000 UNITS PO CPEP
36000.0000 [IU] | ORAL_CAPSULE | Freq: Three times a day (TID) | ORAL | Status: DC
  Administered 2016-02-21 – 2016-02-24 (×9): 36000 [IU] via ORAL
  Filled 2016-02-21 (×9): qty 3

## 2016-02-21 MED ORDER — MAGNESIUM SULFATE 2 GM/50ML IV SOLN
2.0000 g | Freq: Once | INTRAVENOUS | Status: AC
  Administered 2016-02-21: 2 g via INTRAVENOUS
  Filled 2016-02-21: qty 50

## 2016-02-21 MED ORDER — KCL IN DEXTROSE-NACL 40-5-0.45 MEQ/L-%-% IV SOLN
INTRAVENOUS | Status: DC
  Administered 2016-02-21 – 2016-02-22 (×3): via INTRAVENOUS
  Filled 2016-02-21 (×5): qty 1000

## 2016-02-21 NOTE — Progress Notes (Addendum)
PROGRESS NOTE  [name]  KHT:[id number] DOB: [birth year] DOA: 02/20/2016 PCP: [name], DO  Brief Narrative:   [name] is a 55 y.o. female with medical history significant of Asthma, Crohn's disease, HTN, HLD, GERD, psoriasis, kidney stones who presented with progressively worsening nausea, vomiting, diarrhea, and left lower quadrant abdominal pain. She is followed by [facility] gastroenterology, Dr. Silverio Decamp, and has been receiving Vedolizumab every other month since 2016. Over the summer, she developed a transaminitis and her azathioprine was discontinued.  Her budesonide was discontinued at the same appointment. Although she was feeling well, gaining weight, and had controlled diarrhea at that appointment, ever since those changes were made to Progressive worsening of her diarrhea, nausea, and left lower quadrant pain her symptoms have progressed more rapidly over the last month and ultimately ended in this hospitalization. She denies any recent fevers, chills, or sick contacts.  In the ED she was found to have low K of 2.6 and low Mag of 1.5 which were replaced.  WBC was normal and she was afebrile. CT of abdomen and pelvis demonstrated diffuse fatty wall thickening throughout the colon and the anastomotic segment of small bowel consistent with chronic inflammation. She had a small area of colonic mucosal enhancement along the descending segment which could represent superimposed acute colitis.  She was started on antibiotics and made nothing by mouth and I have contacted gastroenterology for consultation.  Assessment & Plan:   Principal Problem:   Nausea & vomiting  Active Problems:   GERD   Crohn's disease (HCC)   Asthma   Abdominal pain   Hypokalemia   Hypomagnesemia   Colitis  Acute colitis with nausea, vomiting and abdominal pain, likely due to a Crohn's flare. Her C. difficile PCR was negative. Given the chronicity, I doubt that this is a viral infection. -  Discontinue ciprofloxacin due to prolonged QTC -  Start ceftriaxone -  Continue Flagyl -  Continue nothing by mouth and resume IV fluids  - Nausea control with phenergan and/or compazine, hold zofran given QTc prolongation -  ESR 62 -  CRP -  Repeat anemia studies  Pancreatic insufficiency -  Hold Creon while nothing by mouth  Hypokalemia, hypomagnesemia -  We will give an additional 2 g of magnesium -  Add potassium to IV fluids  GERD - Continue formulary PPI (protonix)    Crohn's disease ([name]) - Verified compliance with her Entyvio     Asthma - She reports not need inhalers - Hold symbicort for now unless needed - PRN albuterol  Elevated BP -  Start Norvasc  Chronic Pain - Sees [facility] for pain control medications, she reports back and abdominal pain - Continue home dose of Oxycodone IR   DVT prophylaxis:  Lovenox Code Status:  Full code Family Communication:  Patient alone Disposition Plan:  Pending tolerating adequate hydration and oral medications. Gastroenterology consult pending   Consultants:   [facility], Dr. Silverio Decamp  Procedures:  None  Antimicrobials:  Anti-infectives    Start     Dose/Rate Route Frequency Ordered Stop   02/21/16 0800  cefTRIAXone (ROCEPHIN) 2 g in dextrose 5 % 50 mL IVPB     2 g 100 mL/hr over 30 Minutes Intravenous Every 24 hours 02/21/16 0727     02/20/16 2230  metroNIDAZOLE (FLAGYL) tablet 500 mg     500 mg Oral Every 8 hours 02/20/16 2218     02/20/16 2215  ciprofloxacin (CIPRO) IVPB 400 mg  Status:  Discontinued     400 mg 200 mL/hr over 60 Minutes Intravenous Every 12 hours 02/20/16 2207 02/21/16 0717       Subjective: Still having intermittent nausea and frequent watery diarrhea about 10-12 times per day. No blood in her stools. She has left lower quadrant pain that is similar to how was about a month ago.  Objective: Vitals:   02/20/16 2336 02/21/16 0408 02/21/16 0503 02/21/16  1100  BP: (!) 197/90 (!) 200/93 (!) 163/77 (!) 164/80  Pulse: 91 98 96 80  Resp: _0 Temp: 98.3 F (36.8 C) 98.3 F (36.8 C)  98.5 F (36.9 C)  TempSrc: Oral Oral  Oral  SpO2: 97% 98%  97%  Weight:      Height:        Intake/Output Summary (Last 24 hours) at 02/21/16 1123 Last data filed at 02/21/16 1100  Gross per 24 hour  Intake              200 ml  Output              200 ml  Net                0 ml   Filed Weights   02/20/16 1512  Weight: 80.1 kg (176 lb 8 oz)    Examination:  General exam:  Adult Female.  No acute distress.  HEENT:  NCAT, MMM Respiratory system: Clear to auscultation bilaterally Cardiovascular system: Regular rate and rhythm, normal S1/S2. No murmurs, rubs, gallops or clicks.  Warm extremities Gastrointestinal system: Hyperactive bowel sounds, soft, nondistended, tender to palpation in the left lower quadrant without rebound or guarding. MSK:  Normal tone and bulk, no lower extremity edema Neuro:  Grossly intact    Data Reviewed: I have personally reviewed following labs and imaging studies  CBC:  Recent Labs Lab 02/20/16 1553 02/20/16 2219 02/21/16 0535  WBC 7.5 6.6 6.0  HGB 12.8 12.0 11.6*  HCT 36.9 35.3* 34.8*  MCV 88.5 89.1 90.6  PLT 272 235 557   Basic Metabolic Panel:  Recent Labs Lab 02/20/16 1553 02/20/16 1750 02/20/16 2219 02/21/16 0535  NA 143  --  141 141  K 2.6*  --  2.8* 3.1*  CL 107  --  106 108  CO2 26  --  27 25  GLUCOSE 107*  --  111* 112*  BUN 8  --  6 <5*  CREATININE 0.83  --  0.77 0.62  CALCIUM 8.9  --  8.5* 8.1*  MG  --  1.5*  --   --    GFR: Estimated Creatinine Clearance: 85.5 mL/min (by C-G formula based on SCr of 0.62 mg/dL). Liver Function Tests:  Recent Labs Lab 02/20/16 1553  AST 24  ALT 29  ALKPHOS 107  BILITOT 0.9  PROT 7.3  ALBUMIN 3.7    Recent Labs Lab 02/20/16 1553  LIPASE 19   No results for input(s): AMMONIA in the last 168 hours. Coagulation Profile: No  results for input(s): INR, PROTIME in the last 168 hours. Cardiac Enzymes: No results for input(s): CKTOTAL, CKMB, CKMBINDEX, TROPONINI in the last 168 hours. BNP (last 3 results) No results for input(s): PROBNP in the last 8760 hours. HbA1C: No results for input(s): HGBA1C in the last 72 hours. CBG: No results for input(s): GLUCAP in the last 168 hours. Lipid Profile: No results for input(s): CHOL, HDL, LDLCALC, TRIG, CHOLHDL, LDLDIRECT in the last 72 hours. Thyroid Function Tests:  No results for input(s): TSH, T4TOTAL, FREET4, T3FREE, THYROIDAB in the last 72 hours. Anemia Panel: No results for input(s): VITAMINB12, FOLATE, FERRITIN, TIBC, IRON, RETICCTPCT in the last 72 hours. Urine analysis:    Component Value Date/Time   COLORURINE YELLOW 02/20/2016 1723   APPEARANCEUR CLEAR 02/20/2016 1723   LABSPEC 1.025 02/20/2016 1723   PHURINE 6.0 02/20/2016 1723   GLUCOSEU NEGATIVE 02/20/2016 1723   HGBUR TRACE (A) 02/20/2016 1723   BILIRUBINUR MODERATE (A) 02/20/2016 1723   KETONESUR 15 (A) 02/20/2016 1723   PROTEINUR TRACE (A) 02/20/2016 1723   UROBILINOGEN 0.2 03/29/2013 0919   NITRITE NEGATIVE 02/20/2016 1723   LEUKOCYTESUR NEGATIVE 02/20/2016 1723   Sepsis Labs: _0 (procalcitonin:4,lacticidven:4)  ) Recent Results (from the past 240 hour(s))  C difficile quick scan w PCR reflex     Status: None   Collection Time: 02/20/16 10:25 PM  Result Value Ref Range Status   C Diff antigen NEGATIVE NEGATIVE Final   C Diff toxin NEGATIVE NEGATIVE Final   C Diff interpretation No C. difficile detected.  Final      Radiology Studies: Ct Abdomen Pelvis W Contrast  Result Date: 02/20/2016 CLINICAL DATA:  55 y/o F; severe emesis and diarrhea. Left lower quadrant abdominal pain. History of Crohn's disease. EXAM: CT ABDOMEN AND PELVIS WITH CONTRAST TECHNIQUE: Multidetector CT imaging of the abdomen and pelvis was performed using the standard protocol following bolus administration  of intravenous contrast. CONTRAST:  145m ISOVUE-300 IOPAMIDOL (ISOVUE-300) INJECTION 61% COMPARISON:  03/29/2013 CT of abdomen and pelvis FINDINGS: Lower chest: No acute abnormality. Hepatobiliary: No focal liver abnormality is seen. Status post cholecystectomy. Stable mild intra and extrahepatic biliary ductal dilatation which is probably compensatory post cholecystectomy. No obstructing stone or lesion identified. Pancreas: Unremarkable. No pancreatic ductal dilatation or surrounding inflammatory changes. Spleen: Normal in size without focal abnormality. Adrenals/Urinary Tract: Left kidney upper pole 7 mm exophytic fluid attenuating structure compatible with cyst. No urinary stone disease. No obstructive uropathy. Normal bladder. 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 There is diffuse fatty wall thickening throughout the colon and anastomosis loop of small bowel at the right upper quadrant and tear colonic anastomosis compatible with chronic inflammatory change. A component of acute colitis may be present in the descending segment of colon as there appears to be mild mucosal enhancement (series 4, image 80). There are no obstructive changes of bowel. Vascular/Lymphatic: Aortic atherosclerosis. No enlarged abdominal or pelvic lymph nodes. Reproductive: Status post hysterectomy. No adnexal masses. Other: Ventral midline anterior abdominal wall postsurgical changes with tiny fat containing hernias. Musculoskeletal: Sclerotic changes within the femoral heads bilaterally and subchondral lucency is compatible with avascular necrosis and stable. No acute osseous abnormality is evident. IMPRESSION: 1. Diffuse fatty wall thickening throughout the colon and the anastomotic segment of small bowel is compatible with chronic inflammation. Mild colonic mucosal enhancement most pronounced in the descending segment may represent superimposed acute colitis. 2. No evidence for  bowel obstruction. No peritoneal collection or abscess. Electronically Signed   By: LKristine GarbeM.D.   On: 02/20/2016 20:21     Scheduled Meds: . amLODipine  5 mg Oral Daily  . cefTRIAXone (ROCEPHIN)  IV  2 g Intravenous Q24H  . enoxaparin (LOVENOX) injection  40 mg Subcutaneous QHS  . hydrocortisone-pramoxine  1 application Rectal TID  . lipase/protease/amylase  36,000 Units Oral TID AC  . metroNIDAZOLE  500 mg Oral Q8H  . pantoprazole  80 mg Oral Daily   Continuous  Infusions: . dextrose 5 % and 0.45 % NaCl with KCl 40 mEq/L 100 mL/hr at 02/21/16 1108     LOS: 1 day    Time spent: 30 min    Janece Canterbury, MD [facility] Pager [phone]  If 7PM-7AM, please contact night-coverage [email or web address] Password [email or web address] 02/21/2016, 11:23 AM

## 2016-02-21 NOTE — Progress Notes (Signed)
Pharmacy Antibiotic Note  Jessica KRIZAN is a 55 y.o. female with hx GERD and Crohn's disease admitted on 02/20/2016 with N/V/D x 2 days found to have acute colitis.  Pharmacy has been consulted for ceftriaxone dosing for intra-abdominal infection  Plan: Ceftriaxone 2g IV q24h Need for further dosage adjustment appears unlikely at present.    Will sign off at this time.  Please reconsult if a change in clinical status warrants re-evaluation of dosage.  Ralene Bathe, PharmD, BCPS 02/21/2016, 7:25 AM  Pager: [phone]     Height: 5' 7"  (170.2 cm) Weight: 176 lb 8 oz (80.1 kg) IBW/kg (Calculated) : 61.6  Temp (24hrs), Avg:98.4 F (36.9 C), Min:98.3 F (36.8 C), Max:98.6 F (37 C)   Recent Labs Lab 02/20/16 1553 02/20/16 2219 02/21/16 0535  WBC 7.5 6.6 6.0  CREATININE 0.83 0.77 0.62    Estimated Creatinine Clearance: 85.5 mL/min (by C-G formula based on SCr of 0.62 mg/dL).    Allergies  Allergen Reactions  . Codeine Hives and Nausea And Vomiting  . Humira [Adalimumab] Rash    Antimicrobials this admission: 12/17 Cipro x1 12/17 Flagyl >> 12/17 CTX >>   Dose adjustments this admission:  Microbiology results: 12/17 CDiff PCR: antigen neg / toxin neg  Thank you for allowing pharmacy to be a part of this patient's care.  Ralene Bathe, PharmD, BCPS 02/21/2016, 7:27 AM  Pager: [phone]

## 2016-02-21 NOTE — Consult Note (Signed)
Referring Provider:  Dr. Sheran Fava  Primary Care Physician:  [name], DO Primary Gastroenterologist:  Dr. Silverio Decamp  Reason for Consultation:  ? Crohn's flare  HPI: [name] is a 55 y.o. female with medical history significant of Asthma, Crohn's diseas s/p Terminal ileal resection in 2008 and 2012, HTN, HLD, GERD, psoriasis, kidney stones, chronic pain, and pancreatic insufficiency who presented with N/V/D for the past few days.  For her Crohn's disease she is on Entyvio every 8 weeks.  She was last seen in [place] office in July by Dr. Silverio Decamp at which time she was doing well.  Was on Entyvio, budesonide, and azathioprine at that time.  Those were discontinued at that visit, however.  She says that starting about 6 weeks after that visit she started having worsening diarrhea and was having random/intermittent episodes of vomiting.  She has not been taking her pancreatic enzymes because they were no longer covered by her insurance.    Then, over the past several days she became acutely worse with more vomiting and diarrhea.  No blood in stools and she denies abdominal pain.  This is why she came to the ED.  ED Course: In the ED she was found to have low K of 2.6 and low Mag of 1.5 which were replaced.  WBC was normal.  CT scan abdomen and pelvis with contrast showed the following:  IMPRESSION: 1. Diffuse fatty wall thickening throughout the colon and the anastomotic segment of small bowel is compatible with chronic inflammation. Mild colonic mucosal enhancement most pronounced in the descending segment may represent superimposed acute colitis. 2. No evidence for bowel obstruction. No peritoneal collection or abscess.  Cdiff negative.  Sed rate elevated at 62.  CRP pending.  Has been placed on ceftriaxone and flagyl (no cipro due to prolonged QTc).  Feels a little better today.  Reports that she had been having 12 or more BM's daily.  Reports 5 stools since her admission.  Follows  with pain clinic and is on opiates for chronic pain.  Last colonoscopy in March 2016 showed tubular adenoma, focal active ileitis and colitis on random biopsies. No evidence of dysplasia.    Past Medical History:  Diagnosis Date  . Arthritis   . Chronic diarrhea   . Chronic disease anemia   . Crohn's colitis ([place])   . Crohn's disease ([place])   . Depression   . Family history of malignant neoplasm of gastrointestinal tract   . GERD (gastroesophageal reflux disease)   . H/O hiatal hernia   . History of adenomatous polyp of colon   . History of gastritis    AND HX ILEITIS  . History of kidney stones   . History of small bowel obstruction    SECONDARY CROHN'S STRICTURE  S/P COLECTOMY  . Hyperlipidemia   . Hypertension   . Hypopotassemia   . Internal hemorrhoids   . Kidney stones   . PONV (postoperative nausea and vomiting)   . Psoriasis    SKIN  . Right ureteral stone   . Rotavirus infection 05/31/2013  . S/P dilatation of esophageal stricture   . Seasonal asthma   . Urgency of urination   . Vitamin B12 deficiency     Past Surgical History:  Procedure Laterality Date  . ANAL FISSURE REPAIR  1990's  . CARPAL TUNNEL RELEASE Right 2000  . CHOLECYSTECTOMY  2002  . CYSTOSCOPY WITH RETROGRADE PYELOGRAM, URETEROSCOPY AND STENT PLACEMENT Left 03/08/2013   Procedure: CYSTOSCOPY WITH RETROGRADE PYELOGRAM,  URETEROSCOPY AND STENT PLACEMENT stone basketing;  Surgeon: Alexis Frock, MD;  Location: [facility];  Service: Urology;  Laterality: Left;  . CYSTOSCOPY WITH RETROGRADE PYELOGRAM, URETEROSCOPY AND STENT PLACEMENT Right 04/03/2013   Procedure: CYSTOSCOPY WITH RETROGRADE PYELOGRAM, URETEROSCOPY AND STENT PLACEMENT;  Surgeon: Alexis Frock, MD;  Location: [facility];  Service: Urology;  Laterality: Right;  . DX LAPAROSCOPY CONVERTED TO OPEN RIGHT COLECCTOMY  09-15-2010   ANASTOMOTIC STRICTURE  . LAPAROSCOPIC ILEOCECECTOMY  10-09-2008   AND APPENDECTOMY (TERMINAL ILEITIS &  PARTIAL SBO)  . VAGINAL HYSTERECTOMY  1992    Prior to Admission medications   Medication Sig Start Date End Date Taking? Authorizing Provider  albuterol (PROAIR HFA) 108 (90 BASE) MCG/ACT inhaler Inhale 2 puffs into the lungs every 4 (four) hours as needed for wheezing or shortness of breath.    Yes Historical Provider, MD  budesonide-formoterol (SYMBICORT) 80-4.5 MCG/ACT inhaler Inhale 2 puffs into the lungs daily as needed (SOB, wheezing).    Yes Historical Provider, MD  cyanocobalamin (,VITAMIN B-12,) 1000 MCG/ML injection Inject 1 mL (1,000 mcg total) into the skin every 30 (thirty) days. 01/02/15  Yes Mauri Pole, MD  eletriptan (RELPAX) 40 MG tablet Take 40 mg by mouth as needed for migraine. Take 63m at onset of headache, may repeat in 2 hours if needed. Not to exceed 2 tabs in 24 hours 02/12/16  Yes Historical Provider, MD  gabapentin (NEURONTIN) 400 MG capsule Take 400 mg by mouth 2 (two) times daily.  03/05/15  Yes Historical Provider, MD  hydrocortisone (PROCTOCORT) 1 % CREA Use rectally as needed 09/23/15  Yes KMauri Pole MD  levETIRAcetam (KEPPRA) 250 MG tablet Take 250 mg by mouth 2 (two) times daily. 02/12/16 02/11/17 Yes Historical Provider, MD  Needles & Syringes MISC 1 Syringe by Does not apply route every 30 (thirty) days. 01/02/15  Yes KMauri Pole MD  omeprazole (PRILOSEC) 40 MG capsule Take 1 capsule (40 mg total) by mouth daily. 09/14/15  Yes KMauri Pole MD  oxycodone (OXY-IR) 5 MG capsule Take 5 mg by mouth every 4 (four) hours as needed for pain.   Yes Historical Provider, MD  promethazine (PHENERGAN) 25 MG suppository Place 1 suppository (25 mg total) rectally every 6 (six) hours as needed for nausea or vomiting. 09/14/15  Yes KMauri Pole MD  promethazine (PHENERGAN) 25 MG tablet TAKE ONE TABLET BY MOUTH EVERY 8 HOURS AS NEEDED FOR NAUSEA/VOMITING 10/05/15  Yes KMauri Pole MD  rizatriptan (MAXALT-MLT) 10 MG disintegrating tablet  Take 10 mg by mouth as needed for migraine. Take 163mat onset of headache, may repeat in 2 hours if needed. Not to exceed 2 tabs in 24 hours 02/12/16  Yes Historical Provider, MD  tiZANidine (ZANAFLEX) 4 MG tablet Take 4 mg by mouth 3 (three) times daily.   Yes Historical Provider, MD  Vedolizumab (ENTYVIO IV) Inject into the vein every 8 (eight) weeks.   Yes Historical Provider, MD  econazole nitrate 1 % cream Apply topically daily. Patient not taking: Reported on 02/20/2016 09/14/15   KaMauri PoleMD  lipase/protease/amylase (CREON) 36000 UNITS CPEP capsule Take 2 PO with each meal three times daily and 1 with snack Patient not taking: Reported on 02/20/2016 09/14/15   KaMauri PoleMD  ranitidine (ZANTAC) 150 MG tablet Take 2 tablets (300 mg total) by mouth at bedtime. Patient not taking: Reported on 02/20/2016 09/14/15   KaMauri PoleMD    Current Facility-Administered  Medications  Medication Dose Route Frequency Provider Last Rate Last Dose  . albuterol (PROVENTIL) (2.5 MG/3ML) 0.083% nebulizer solution 3 mL  3 mL Inhalation Q4H PRN Sid Falcon, MD      . amLODipine (NORVASC) tablet 5 mg  5 mg Oral Daily Janece Canterbury, MD   5 mg at 02/21/16 0906  . cefTRIAXone (ROCEPHIN) 2 g in dextrose 5 % 50 mL IVPB  2 g Intravenous Q24H Janece Canterbury, MD   2 g at 02/21/16 0913  . dextrose 5 % and 0.45 % NaCl with KCl 40 mEq/L infusion   Intravenous Continuous Janece Canterbury, MD      . enoxaparin (LOVENOX) injection 40 mg  40 mg Subcutaneous QHS Sid Falcon, MD   40 mg at 02/20/16 2309  . hydrALAZINE (APRESOLINE) tablet 10 mg  10 mg Oral Q8H PRN Sid Falcon, MD   10 mg at 02/21/16 0413  . hydrocortisone-pramoxine (ANALPRAM-HC) 2.5-1 % rectal cream 1 application  1 application Rectal TID Sid Falcon, MD   1 application at 72/53/66 0914  . magnesium sulfate IVPB 2 g 50 mL  2 g Intravenous Once Janece Canterbury, MD   2 g at 02/21/16 0903  . metroNIDAZOLE (FLAGYL) tablet 500  mg  500 mg Oral Q8H Sid Falcon, MD   500 mg at 02/21/16 0501  . oxyCODONE (Oxy IR/ROXICODONE) immediate release tablet 5 mg  5 mg Oral Q4H PRN Sid Falcon, MD   5 mg at 02/21/16 0501  . pantoprazole (PROTONIX) EC tablet 80 mg  80 mg Oral Daily Sid Falcon, MD   80 mg at 02/21/16 0906  . prochlorperazine (COMPAZINE) injection 10 mg  10 mg Intravenous Q6H PRN Sid Falcon, MD   10 mg at 02/20/16 2250  . promethazine (PHENERGAN) suppository 25 mg  25 mg Rectal Q6H PRN Sid Falcon, MD      . promethazine (PHENERGAN) tablet 12.5 mg  12.5 mg Oral Q6H PRN Sid Falcon, MD   12.5 mg at 02/21/16 0408    Allergies as of 02/20/2016 - Review Complete 02/20/2016  Allergen Reaction Noted  . Codeine Hives and Nausea And Vomiting 08/31/2007  . Humira [adalimumab] Rash 12/24/2013    Family History  Problem Relation Age of Onset  . Colon cancer Mother   . Colon polyps Mother   . Colon polyps Father   . Lung cancer Father   . Breast cancer Paternal Grandmother   . Colon polyps Brother   . Thyroid disease Neg Hx     Social History   Social History  . Marital status: Married    Spouse name: N/A  . Number of children: 2  . Years of education: N/A   Occupational History  . cook [employer]   Social History Main Topics  . Smoking status: Former Smoker    Packs/day: 1.00    Years: 10.00    Types: Cigarettes    Quit date: 03/07/1984  . Smokeless tobacco: Never Used  . Alcohol use No  . Drug use: No  . Sexual activity: Not on file   Other Topics Concern  . Not on file   Social History Narrative  . No narrative on file    Review of Systems: ROS is O/W negative except as mentioned in HPI.  Physical Exam: Vital signs in last 24 hours: Temp:  [98.3 F (36.8 C)-98.6 F (37 C)] 98.3 F (36.8 C) (12/17 0408) Pulse Rate:  [82-115] 96 (12/17  0737) Resp:  [11-20] 20 (12/17 0408) BP: (163-200)/(77-116) 163/77 (12/17 0503) SpO2:  [96 %-99 %] 98 % (12/17 0408) Weight:  [176 lb  8 oz (80.1 kg)] 176 lb 8 oz (80.1 kg) (12/16 1512) Last BM Date: 02/21/16 General:  Alert, Well-developed, well-nourished, pleasant and cooperative in NAD Head:  Normocephalic and atraumatic. Eyes:  Sclera clear, no icterus.  Conjunctiva pink. Ears:  Normal auditory acuity. Mouth:  No deformity or lesions.   Lungs:  Clear throughout to auscultation.  No wheezes, crackles, or rhonchi.  Heart:  Regular rate and rhythm; no murmurs, clicks, rubs, or gallops. Abdomen:  Soft, non-distended.  BS present.  Non-tender. Rectal:  Deferred  Msk:  Symmetrical without gross deformities.  Pulses:  Normal pulses noted. Extremities:  Without clubbing or edema. Neurologic:  Alert and oriented x 4;  grossly normal neurologically. Skin:  Intact without significant lesions or rashes. Psych:  Alert and cooperative. Normal mood and affect.  Intake/Output from previous day: 12/16 0701 - 12/17 0700 In: 200 [IV Piggyback:200] Out: 200 [Urine:200]  Lab Results:  Recent Labs  02/20/16 1553 02/20/16 2219 02/21/16 0535  WBC 7.5 6.6 6.0  HGB 12.8 12.0 11.6*  HCT 36.9 35.3* 34.8*  PLT 272 235 207   BMET  Recent Labs  02/20/16 1553 02/20/16 2219 02/21/16 0535  NA 143 141 141  K 2.6* 2.8* 3.1*  CL 107 106 108  CO2 26 27 25   GLUCOSE 107* 111* 112*  BUN 8 6 <5*  CREATININE 0.83 0.77 0.62  CALCIUM 8.9 8.5* 8.1*   LFT  Recent Labs  02/20/16 1553  PROT 7.3  ALBUMIN 3.7  AST 24  ALT 29  ALKPHOS 107  BILITOT 0.9   Studies/Results: Ct Abdomen Pelvis W Contrast  Result Date: 02/20/2016 CLINICAL DATA:  55 y/o F; severe emesis and diarrhea. Left lower quadrant abdominal pain. History of Crohn's disease. EXAM: CT ABDOMEN AND PELVIS WITH CONTRAST TECHNIQUE: Multidetector CT imaging of the abdomen and pelvis was performed using the standard protocol following bolus administration of intravenous contrast. CONTRAST:  173m ISOVUE-300 IOPAMIDOL (ISOVUE-300) INJECTION 61% COMPARISON:  03/29/2013 CT of  abdomen and pelvis FINDINGS: Lower chest: No acute abnormality. Hepatobiliary: No focal liver abnormality is seen. Status post cholecystectomy. Stable mild intra and extrahepatic biliary ductal dilatation which is probably compensatory post cholecystectomy. No obstructing stone or lesion identified. Pancreas: Unremarkable. No pancreatic ductal dilatation or surrounding inflammatory changes. Spleen: Normal in size without focal abnormality. Adrenals/Urinary Tract: Left kidney upper pole 7 mm exophytic fluid attenuating structure compatible with cyst. No urinary stone disease. No obstructive uropathy. Normal bladder. 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 There is diffuse fatty wall thickening throughout the colon and anastomosis loop of small bowel at the right upper quadrant and tear colonic anastomosis compatible with chronic inflammatory change. A component of acute colitis may be present in the descending segment of colon as there appears to be mild mucosal enhancement (series 4, image 80). There are no obstructive changes of bowel. Vascular/Lymphatic: Aortic atherosclerosis. No enlarged abdominal or pelvic lymph nodes. Reproductive: Status post hysterectomy. No adnexal masses. Other: Ventral midline anterior abdominal wall postsurgical changes with tiny fat containing hernias. Musculoskeletal: Sclerotic changes within the femoral heads bilaterally and subchondral lucency is compatible with avascular necrosis and stable. No acute osseous abnormality is evident. IMPRESSION: 1. Diffuse fatty wall thickening throughout the colon and the anastomotic segment of small bowel is compatible with chronic inflammation. Mild colonic mucosal enhancement most  pronounced in the descending segment may represent superimposed acute colitis. 2. No evidence for bowel obstruction. No peritoneal collection or abscess. Electronically Signed   By: Kristine Garbe M.D.   On:  02/20/2016 20:21   IMPRESSION:  *55 year old female with ileocolonic Crohn's disease s/p Terminal ileal resection in 2008 and 2012 on Entyvio every 8 weeks.  Had progressive worsening of diarrhea for the past few month and intermittent vomiting as well, but symptoms acutely worsened over the past few days.  CT scan shows "chronic inflammation" throughout the colon with possible superimposed acute colitis in the descending colon.  Cdiff negative.  ? Crohn's flare vs acute gastroenteritis.  Sed rate is elevated, which can be seen with infectious source as well.  CRP pending.   *Pancreatic insufficiency:  Contributing to her chronic diarrhea.  Has not been on pancreatic enzymes at home (not covered by insurance so this will need to be addressed by our office staff as outpatient to see if we can get her an alternative to Creon). *Hypokalemia:  Replace per primary service. *Chronic pain:  Follow with pain clinic.  Is on opiates.  ? If this is contributing to her vomiting, ? Delayed gastric emptying.  PLAN: -Will check GI pathogen panel and CMV IgM. -Continue antibiotics for now (on ceftriaxone and flagyl instead of cipro due to prolonged QTc). -Will start on clear liquids. -Will resume pancreatic enzymes (more important for when she begins to eat again).  Ethelle Ola D.  02/21/2016, 9:35 AM  Pager number [phone]

## 2016-02-22 DIAGNOSIS — K5090 Crohn's disease, unspecified, without complications: Secondary | ICD-10-CM

## 2016-02-22 LAB — BASIC METABOLIC PANEL
Anion gap: 7 (ref 5–15)
CO2: 26 mmol/L (ref 22–32)
CREATININE: 0.69 mg/dL (ref 0.44–1.00)
Calcium: 8.2 mg/dL — ABNORMAL LOW (ref 8.9–10.3)
Chloride: 105 mmol/L (ref 101–111)
GFR calc Af Amer: >60 mL/min (ref >60)
Glucose, Bld: 128 mg/dL — ABNORMAL HIGH (ref 65–99)
POTASSIUM: 3.2 mmol/L — AB (ref 3.5–5.1)
SODIUM: 138 mmol/L (ref 135–145)

## 2016-02-22 LAB — CBC
HCT: 35 % — ABNORMAL LOW (ref 36.0–46.0)
Hemoglobin: 11.8 g/dL — ABNORMAL LOW (ref 12.0–15.0)
MCH: 30.6 pg (ref 26.0–34.0)
MCHC: 33.7 g/dL (ref 30.0–36.0)
MCV: 90.9 fL (ref 78.0–100.0)
PLATELETS: 235 10*3/uL (ref 150–400)
RBC: 3.85 MIL/uL — ABNORMAL LOW (ref 3.87–5.11)
RDW: 12.7 % (ref 11.5–15.5)
WBC: 5.3 10*3/uL (ref 4.0–10.5)

## 2016-02-22 LAB — CMV IGM

## 2016-02-22 MED ORDER — LEVETIRACETAM 250 MG PO TABS
250.0000 mg | ORAL_TABLET | Freq: Two times a day (BID) | ORAL | Status: DC
  Filled 2016-02-22: qty 1

## 2016-02-22 MED ORDER — ADULT MULTIVITAMIN W/MINERALS CH
1.0000 | ORAL_TABLET | Freq: Every day | ORAL | Status: DC
  Administered 2016-02-22 – 2016-02-23 (×2): 1 via ORAL
  Filled 2016-02-22 (×2): qty 1

## 2016-02-22 MED ORDER — LEVETIRACETAM 250 MG PO TABS: 250.0000 mg | ORAL_TABLET | Freq: Two times a day (BID) | ORAL | Status: DC

## 2016-02-22 MED ORDER — TIZANIDINE HCL 4 MG PO TABS
4.0000 mg | ORAL_TABLET | Freq: Three times a day (TID) | ORAL | Status: DC
  Administered 2016-02-22 – 2016-02-23 (×6): 4 mg via ORAL
  Filled 2016-02-22 (×6): qty 1

## 2016-02-22 MED ORDER — POTASSIUM CHLORIDE CRYS ER 20 MEQ PO TBCR
20.0000 meq | EXTENDED_RELEASE_TABLET | Freq: Once | ORAL | Status: AC
  Administered 2016-02-22: 20 meq via ORAL
  Filled 2016-02-22: qty 1

## 2016-02-22 MED ORDER — LEVETIRACETAM 250 MG PO TABS
250.0000 mg | ORAL_TABLET | Freq: Two times a day (BID) | ORAL | Status: DC
  Administered 2016-02-22 – 2016-02-23 (×4): 250 mg via ORAL
  Filled 2016-02-22 (×5): qty 1

## 2016-02-22 MED ORDER — AMLODIPINE BESYLATE 10 MG PO TABS
10.0000 mg | ORAL_TABLET | Freq: Every day | ORAL | Status: DC
  Administered 2016-02-22 – 2016-02-23 (×2): 10 mg via ORAL
  Filled 2016-02-22 (×2): qty 1

## 2016-02-22 MED ORDER — SUMATRIPTAN SUCCINATE 50 MG PO TABS
50.0000 mg | ORAL_TABLET | ORAL | Status: DC | PRN
  Administered 2016-02-22 – 2016-02-23 (×3): 50 mg via ORAL
  Filled 2016-02-22 (×5): qty 1

## 2016-02-22 NOTE — Progress Notes (Signed)
Patient care resumed by this RN at 1500. Agree with previous assessment charted by Melodye Ped, RN.

## 2016-02-22 NOTE — Progress Notes (Signed)
[facility] Progress Note  Chief Complaint:   diarrhea  Subjective: tolerated clears. BMs still frequent but becoming more solid  Objective:  Vital signs in last 24 hours: Temp:  [98.4 F (36.9 C)-99.7 F (37.6 C)] 99.7 F (37.6 C) (12/18 0410) Pulse Rate:  [78-90] 78 (12/18 0410) Resp:  [16-20] 16 (12/18 0410) BP: (159-175)/(80-94) 175/94 (12/18 0410) SpO2:  [97 %-98 %] 98 % (12/18 0410) Last BM Date: 02/21/16 General:   Alert, well-developed,white in NAD EENT:  Normal hearing, non icteric sclera, conjunctive pink.  Heart:  Regular rate and rhythm, no lower extremity edema Abdomen:  Soft, nondistended, nontender.  Normal bowel sounds, no masses felt. No hepatomegaly.    Neurologic:  Alert and  oriented x4;  grossly normal neurologically. Psych:  Alert and cooperative. Normal mood and affect.   Lab Results:  Recent Labs  02/20/16 2219 02/21/16 0535 02/22/16 0524  WBC 6.6 6.0 5.3  HGB 12.0 11.6* 11.8*  HCT 35.3* 34.8* 35.0*  PLT 235 207 235   BMET  Recent Labs  02/20/16 2219 02/21/16 0535 02/22/16 0524  NA 141 141 138  K 2.8* 3.1* 3.2*  CL 106 108 105  CO2 27 25 26   GLUCOSE 111* 112* 128*  BUN 6 <5* <5*  CREATININE 0.77 0.62 0.69  CALCIUM 8.5* 8.1* 8.2*   LFT  Recent Labs  02/20/16 1553  PROT 7.3  ALBUMIN 3.7  AST 24  ALT 29  ALKPHOS 107  BILITOT 0.9    Ct Abdomen Pelvis W Contrast  Result Date: 02/20/2016 CLINICAL DATA:  55 y/o F; severe emesis and diarrhea. Left lower quadrant abdominal pain. History of Crohn's disease. EXAM: CT ABDOMEN AND PELVIS WITH CONTRAST TECHNIQUE: Multidetector CT imaging of the abdomen and pelvis was performed using the standard protocol following bolus administration of intravenous contrast. CONTRAST:  144m ISOVUE-300 IOPAMIDOL (ISOVUE-300) INJECTION 61% COMPARISON:  03/29/2013 CT of abdomen and pelvis FINDINGS: Lower chest: No acute abnormality. Hepatobiliary: No focal liver abnormality is seen.  Status post cholecystectomy. Stable mild intra and extrahepatic biliary ductal dilatation which is probably compensatory post cholecystectomy. No obstructing stone or lesion identified. Pancreas: Unremarkable. No pancreatic ductal dilatation or surrounding inflammatory changes. Spleen: Normal in size without focal abnormality. Adrenals/Urinary Tract: Left kidney upper pole 7 mm exophytic fluid attenuating structure compatible with cyst. No urinary stone disease. No obstructive uropathy. Normal bladder. 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 There is diffuse fatty wall thickening throughout the colon and anastomosis loop of small bowel at the right upper quadrant and tear colonic anastomosis compatible with chronic inflammatory change. A component of acute colitis may be present in the descending segment of colon as there appears to be mild mucosal enhancement (series 4, image 80). There are no obstructive changes of bowel. Vascular/Lymphatic: Aortic atherosclerosis. No enlarged abdominal or pelvic lymph nodes. Reproductive: Status post hysterectomy. No adnexal masses. Other: Ventral midline anterior abdominal wall postsurgical changes with tiny fat containing hernias. Musculoskeletal: Sclerotic changes within the femoral heads bilaterally and subchondral lucency is compatible with avascular necrosis and stable. No acute osseous abnormality is evident. IMPRESSION: 1. Diffuse fatty wall thickening throughout the colon and the anastomotic segment of small bowel is compatible with chronic inflammation. Mild colonic mucosal enhancement most pronounced in the descending segment may represent superimposed acute colitis. 2. No evidence for bowel obstruction. No peritoneal collection or abscess. Electronically Signed   By: LKristine GarbeM.D.   On: 02/20/2016 20:21  Assessment / Plan:  1. Crohn's disease, on Entyvio. Admitted with nausea, vomiting diarrhea,  stool pathogen panel / c-diff negative. Stools beginning to solidify.  -CMV still pending.   2. Pancreatic insufficiency.  Had stooped Creon because insurance wouldn't pay. Creon has been restarted. Our office can probably assist her with getting enzymes covered.   3. Hypokalemia, improving. K+ 3.2. She has K+ in her IV bag. Up urinating several times during the night. No dysuria. Will decrease IV rate since taking clears.     LOS: 2 days   [name]  02/22/2016, 9:08 AM  Pager number [phone]  ________________________________________________________________________  Velora Heckler GI MD note:  I personally examined the patient, reviewed the data and agree with the assessment and plan described above.  Her looser than usual stools started shortly after she was unable to get pancreatic enzymes.  Her bowels are getting more solid and less frequent after Iv abx and restarting pancreatic enzymes.  GI path panel neg. I'm going to stop Iv abx since we favor this is main driven by pancreatic insufficiency and unlikely that she has a bacterial colitis, enteritis (could be viral GE contributing).  Will advance her diet to solids as well.   Owens Loffler, MD [facility] Pager [phone]

## 2016-02-22 NOTE — Progress Notes (Signed)
Patient ID: [name], female   DOB: 11/20/1960, 54 y.o.   MRN: [id number]  PROGRESS NOTE    [name]  AUQ:[id number] DOB: 1960-05-11 DOA: 02/20/2016  PCP: [name], DO   Brief Narrative:   55 y.o.femalewith past medical history significant for Asthma, Crohn's disease, HTN, HLD, GERD, psoriasis, kidney stones who presented to [facility] with progressively worsening nausea, vomiting, diarrhea and left lower quadrant abdominal pain. She is followed by [facility] gastroenterology, Dr. Silverio Decamp and has been receiving Vedolizumab every other month since 2016. Over the summer, she developed a transaminitis and her azathioprine was discontinued.  Her budesonide was discontinued as well during that time. Although she was feeling well, gaining weight and had controlled diarrhea at that time, ever since those changes were made she has had progressive nausea, diarrhea and left lower abdominal pain. Symptoms progressed rapidly over the past month prior to this admission.  In ED, blood work was notable for low potassium of 2.6, magnesium 1.5, both were replaced. White blood cell count was within normal limits and patient was afebrile. CT of abdomen and pelvis demonstrated diffuse fatty wall thickening throughout the colon and the anastomotic segment of small bowel consistent with chronic inflammation. She had a small area of colonic mucosal enhancement along the descending segment which was suspicious for superimposed acute colitis.  She was started on antibiotics and surgery has seen her in consultation.   Assessment & Plan:  Acute colitis with nausea, vomiting and abdominal pain, likely due to a Crohn's flare.  - C.diff negative - GI virus panel is negative  - Continue Rocephin and Flagyl - She is on clear liquid diet - Appreciate the GI following and their recommendations  Pancreatic insufficiency - Continue lipase supplementation  Hypokalemia, hypomagnesemia - Due to GI losses -  Continue to supplement  GERD - Continue PPI (protonix)  Crohn's disease ([place]) - Verified compliance with her Entyvio   Asthma - Not in acute exacerbation   Essential hypertension - Increase Norvasc to 10 mg a day   Chronic Pain - Continue home dose of Oxycodone IR   DVT prophylaxis:  Lovenox subQ Code Status:  Full code Family Communication:  Family not at the bedside this am  Disposition Plan:  Home once tolerates regular diet    Consultants:   [facility], Dr. Silverio Decamp   Procedures:   None   Antimicrobials:   Rocephin and flagyl 02/20/2016 -->   Subjective: Abdominal pain is better, 3/10 in intensity.  Objective: Vitals:   02/21/16 1100 02/21/16 1414 02/21/16 2152 02/22/16 0410  BP: (!) 164/80 (!) 169/88 (!) 159/86 (!) 175/94  Pulse: 80 90 81 78  Resp: 20 20 20 16   Temp: 98.5 F (36.9 C) 98.4 F (36.9 C) 99.6 F (37.6 C) 99.7 F (37.6 C)  TempSrc: Oral Oral Oral Oral  SpO2: 97% 98% 98% 98%  Weight:      Height:        Intake/Output Summary (Last 24 hours) at 02/22/16 1100 Last data filed at 02/22/16 0600  Gross per 24 hour  Intake          2226.67 ml  Output                0 ml  Net          2226.67 ml   Filed Weights   02/20/16 1512  Weight: 80.1 kg (176 lb 8 oz)    Examination:  General exam: Appears calm and comfortable  Respiratory  system: Clear to auscultation. Respiratory effort normal. Cardiovascular system: S1 & S2 heard, RRR. No pedal edema. Gastrointestinal system: Abdomen is nondistended, soft and nontender. No organomegaly or masses felt. Normal bowel sounds heard. Central nervous system: Alert and oriented. No focal neurological deficits. Extremities: Symmetric 5 x 5 power. Skin: No rashes, lesions or ulcers Psychiatry: Judgement and insight appear normal. Mood & affect appropriate.   Data Reviewed: I have personally reviewed following labs and imaging studies  CBC:  Recent Labs Lab  02/20/16 1553 02/20/16 2219 02/21/16 0535 02/22/16 0524  WBC 7.5 6.6 6.0 5.3  HGB 12.8 12.0 11.6* 11.8*  HCT 36.9 35.3* 34.8* 35.0*  MCV 88.5 89.1 90.6 90.9  PLT 272 235 207 829   Basic Metabolic Panel:  Recent Labs Lab 02/20/16 1553 02/20/16 1750 02/20/16 2219 02/21/16 0535 02/22/16 0524  NA 143  --  141 141 138  K 2.6*  --  2.8* 3.1* 3.2*  CL 107  --  106 108 105  CO2 26  --  27 25 26   GLUCOSE 107*  --  111* 112* 128*  BUN 8  --  6 <5* <5*  CREATININE 0.83  --  0.77 0.62 0.69  CALCIUM 8.9  --  8.5* 8.1* 8.2*  MG  --  1.5*  --   --   --    GFR: Estimated Creatinine Clearance: 85.5 mL/min (by C-G formula based on SCr of 0.69 mg/dL). Liver Function Tests:  Recent Labs Lab 02/20/16 1553  AST 24  ALT 29  ALKPHOS 107  BILITOT 0.9  PROT 7.3  ALBUMIN 3.7    Recent Labs Lab 02/20/16 1553  LIPASE 19   No results for input(s): AMMONIA in the last 168 hours. Coagulation Profile: No results for input(s): INR, PROTIME in the last 168 hours. Cardiac Enzymes: No results for input(s): CKTOTAL, CKMB, CKMBINDEX, TROPONINI in the last 168 hours. BNP (last 3 results) No results for input(s): PROBNP in the last 8760 hours. HbA1C: No results for input(s): HGBA1C in the last 72 hours. CBG: No results for input(s): GLUCAP in the last 168 hours. Lipid Profile: No results for input(s): CHOL, HDL, LDLCALC, TRIG, CHOLHDL, LDLDIRECT in the last 72 hours. Thyroid Function Tests: No results for input(s): TSH, T4TOTAL, FREET4, T3FREE, THYROIDAB in the last 72 hours. Anemia Panel:  Recent Labs  02/21/16 0853  VITAMINB12 242  FOLATE 18.2  FERRITIN 150  TIBC 370  IRON 51   Urine analysis:    Component Value Date/Time   COLORURINE YELLOW 02/20/2016 1723   APPEARANCEUR CLEAR 02/20/2016 1723   LABSPEC 1.025 02/20/2016 1723   PHURINE 6.0 02/20/2016 1723   GLUCOSEU NEGATIVE 02/20/2016 1723   HGBUR TRACE (A) 02/20/2016 1723   BILIRUBINUR MODERATE (A) 02/20/2016 1723    KETONESUR 15 (A) 02/20/2016 1723   PROTEINUR TRACE (A) 02/20/2016 1723   UROBILINOGEN 0.2 03/29/2013 0919   NITRITE NEGATIVE 02/20/2016 1723   LEUKOCYTESUR NEGATIVE 02/20/2016 1723   Sepsis Labs: @LABRCNTIP (procalcitonin:4,lacticidven:4)   Recent Results (from the past 240 hour(s))  C difficile quick scan w PCR reflex     Status: None   Collection Time: 02/20/16 10:25 PM  Result Value Ref Range Status   C Diff antigen NEGATIVE NEGATIVE Final   C Diff toxin NEGATIVE NEGATIVE Final   C Diff interpretation No C. difficile detected.  Final  Gastrointestinal Panel by PCR , Stool     Status: None   Collection Time: 02/21/16  2:00 PM  Result Value Ref Range Status  Campylobacter species NOT DETECTED NOT DETECTED Final   Plesimonas shigelloides NOT DETECTED NOT DETECTED Final   Salmonella species NOT DETECTED NOT DETECTED Final   Yersinia enterocolitica NOT DETECTED NOT DETECTED Final   Vibrio species NOT DETECTED NOT DETECTED Final   Vibrio cholerae NOT DETECTED NOT DETECTED Final   Enteroaggregative E coli (EAEC) NOT DETECTED NOT DETECTED Final   Enteropathogenic E coli (EPEC) NOT DETECTED NOT DETECTED Final   Enterotoxigenic E coli (ETEC) NOT DETECTED NOT DETECTED Final   Shiga like toxin producing E coli (STEC) NOT DETECTED NOT DETECTED Final   Shigella/Enteroinvasive E coli (EIEC) NOT DETECTED NOT DETECTED Final   Cryptosporidium NOT DETECTED NOT DETECTED Final   Cyclospora cayetanensis NOT DETECTED NOT DETECTED Final   Entamoeba histolytica NOT DETECTED NOT DETECTED Final   Giardia lamblia NOT DETECTED NOT DETECTED Final   Adenovirus F40/41 NOT DETECTED NOT DETECTED Final   Astrovirus NOT DETECTED NOT DETECTED Final   Norovirus GI/GII NOT DETECTED NOT DETECTED Final   Rotavirus A NOT DETECTED NOT DETECTED Final   Sapovirus (I, II, IV, and V) NOT DETECTED NOT DETECTED Final      Radiology Studies: Ct Abdomen Pelvis W Contrast Result Date: 02/20/2016  1. Diffuse fatty wall  thickening throughout the colon and the anastomotic segment of small bowel is compatible with chronic inflammation. Mild colonic mucosal enhancement most pronounced in the descending segment may represent superimposed acute colitis. 2. No evidence for bowel obstruction. No peritoneal collection or abscess. Electronically Signed   By: Kristine Garbe M.D.   On: 02/20/2016 20:21      Scheduled Meds: . amLODipine  10 mg Oral Daily  . cefTRIAXone (ROCEPHIN)  IV  2 g Intravenous Q24H  . enoxaparin (LOVENOX) injection  40 mg Subcutaneous QHS  . hydrocortisone-pramoxine  1 application Rectal TID  . levETIRAcetam  250 mg Oral BID  . levETIRAcetam  250 mg Oral BID  . lipase/protease/amylase  36,000 Units Oral TID AC  . metroNIDAZOLE  500 mg Oral Q8H  . pantoprazole  80 mg Oral Daily  . tiZANidine  4 mg Oral TID   Continuous Infusions: . dextrose 5 % and 0.45 % NaCl with KCl 40 mEq/L 50 mL/hr at 02/22/16 1026     LOS: 2 days    Time spent: 25 minutes  Greater than 50% of the time spent on counseling and coordinating the care.   Leisa Lenz, MD [facility] Pager [phone]  If 7PM-7AM, please contact night-coverage [email or web address] Password [email or web address] 02/22/2016, 11:00 AM

## 2016-02-22 NOTE — Progress Notes (Signed)
Initial Nutrition Assessment  DOCUMENTATION CODES:   Not applicable  INTERVENTION:  - Will order daily multivitamin with minerals.  - Continue to encourage PO intakes of food and beverages. - RD will monitor for additional nutrition-related needs at follow-up.  NUTRITION DIAGNOSIS:   Inadequate oral intake related to acute illness, nausea, vomiting as evidenced by per patient/family report.  GOAL:   Patient will meet greater than or equal to 90% of their needs  MONITOR:   PO intake, Weight trends, Labs, I & O's  REASON FOR ASSESSMENT:   Malnutrition Screening Tool  ASSESSMENT:   55 y.o. female with medical history significant of Asthma, Crohn's disease, HTN, HLD, GERD, psoriasis, kidney stones who presented with N/V/D for 2 days.  She reports that the symptoms started suddenly.  She did not eat any new foods or travel anywhere.  She has no sick contacts and has not recently been on Abx.  She has not been able to tolerate PO and the last thing she was able to take in was Thursday morning.  She has associated symptoms of mild belly pain.  The diarrhea is watery and yellow and the number of movements is too many to count.  The emesis is foamy and yellow.  There is no blood in either.  She has had no chest pain, issues swallowing.  She does not drink well water.  She does have Crohn's disease and this does not seem like a normal flare as pain is not bad and there is no blood in the stool.  She is on immunosuppressant medications including Vedolizumab.  Pt seen for MST. BMI indicates overweight status. Pt was on CLD and tolerated breakfast this AM well so diet now advanced to Heart Healthy (changed at 1114). Pt reports she started having nausea with associated vomiting and diarrhea on 12/14. Since that time until admission she had a very difficult time trying to consume anything PO and experienced vomiting with nearly everything she tried to consume, including water.   Reviewed GI notes  from yesterday AM and this AM. Per Dr. Eugenia Pancoast note this AM at 0856: Her looser than usual stools started shortly after she was unable to get pancreatic enzymes.  Her bowels are getting more solid and less frequent after Iv abx and restarting pancreatic enzymes.  GI path panel neg. I'm going to stop Iv abx since we favor this is main driven by pancreatic insufficiency and unlikely that she has a bacterial colitis, enteritis (could be viral GE contributing).    Physical assessment unable to be performed at this time but will attempt at follow-up. Pt with <50% needed intakes for 5 days d/t N/V/D. Per chart review, pt gained 20 lbs from 04/03/15-05/29/15. She then lost 19 lbs (9.7% body weight) from 01/19/16-02/20/16. Only weight available in the chart at this time is from the ED and unsure at this time if this was a stated weight or obtained on a bed or standing scale. Will continue to monitor weight trends closely. Unable to state malnutrition at this time based on available information but pt likely does meet some degree of malnutrition.   Medications reviewed; 250 mg oral Keppra BID, 16073 units Creon TID with meals, 80 mg oral Protonix/day, 20 mEq oral KCl x1 dose today, PRN IV Compazine, PRN rectal and oral Phenergan. Labs reviewed; K: 3.2 mmol/L, BUN: <5 mg/dL, Ca: 8.2 mg/dL.  IVF: D5-1/2 NS-40 mEq KCl @ 50 mL/hr (204 kcal).     Diet Order:  Diet Heart Room  service appropriate? Yes; Fluid consistency: Thin  Skin:  Reviewed, no issues  Last BM:  12/18  Height:   Ht Readings from Last 1 Encounters:  02/20/16 5' 7"  (1.702 m)    Weight:   Wt Readings from Last 1 Encounters:  02/20/16 176 lb 8 oz (80.1 kg)    Ideal Body Weight:  61.36 kg  BMI:  Body mass index is 27.64 kg/m.  Estimated Nutritional Needs:   Kcal:  1600-1760 (20-22 kcal/kg)  Protein:  65-80 grams (0.8-1 gram/kg)  Fluid:  >/= 2.2 L/day  EDUCATION NEEDS:   No education needs identified at this time    Jarome Matin, MS, RD, LDN, CNSC Inpatient Clinical Dietitian Pager # [phone] After hours/weekend pager # [phone]

## 2016-02-23 ENCOUNTER — Ambulatory Visit: Payer: Medicare Other | Admitting: Gastroenterology

## 2016-02-23 LAB — BASIC METABOLIC PANEL
Anion gap: 5 (ref 5–15)
BUN: 6 mg/dL (ref 6–20)
CALCIUM: 8.6 mg/dL — AB (ref 8.9–10.3)
CO2: 27 mmol/L (ref 22–32)
Chloride: 107 mmol/L (ref 101–111)
Creatinine, Ser: 0.95 mg/dL (ref 0.44–1.00)
GFR calc Af Amer: >60 mL/min (ref >60)
GLUCOSE: 126 mg/dL — AB (ref 65–99)
Potassium: 3.5 mmol/L (ref 3.5–5.1)
SODIUM: 139 mmol/L (ref 135–145)

## 2016-02-23 LAB — CBC
HCT: 34.4 % — ABNORMAL LOW (ref 36.0–46.0)
Hemoglobin: 11.2 g/dL — ABNORMAL LOW (ref 12.0–15.0)
MCH: 30.1 pg (ref 26.0–34.0)
MCHC: 32.6 g/dL (ref 30.0–36.0)
MCV: 92.5 fL (ref 78.0–100.0)
Platelets: 206 10*3/uL (ref 150–400)
RBC: 3.72 MIL/uL — ABNORMAL LOW (ref 3.87–5.11)
RDW: 12.8 % (ref 11.5–15.5)
WBC: 5.3 10*3/uL (ref 4.0–10.5)

## 2016-02-23 LAB — MAGNESIUM: MAGNESIUM: 1.8 mg/dL (ref 1.7–2.4)

## 2016-02-23 NOTE — Progress Notes (Signed)
Trying to help patient get pancreatic enzymes. I spoke to nurse at our office. She may need to apply for patient assistance program which can takes weeks and also she may not qualify. We don't have many samples at our office. I have consulted Case Management to see if they know of any other way she can get Creon or some other pancreatic enzymes.

## 2016-02-23 NOTE — Progress Notes (Addendum)
CM consult for medication assistance. Due to pt having insurance there is no financial assistance through the hospital to help with getting enzymes. This CM will do benefits check with insurance to see if there is another enzyme on the formulary for [facility] that can be ordered.  Addendum:  Per phone call with  pt's prescription coverage plan ([employer]) Creon is covered and pt would only have a $35 copay for a thirty day supply. This information was given to pt.  Marney Doctor RN,BSN,NCM [phone]

## 2016-02-23 NOTE — Progress Notes (Signed)
     [facility] Progress Note  Chief Complaint:   Diarrhea  Subjective: Feels A LOT better today. Tolerating solids. Only one BM since yesterday (mushy). No pain.   Objective:  Vital signs in last 24 hours: Temp:  [97.8 F (36.6 C)-99.1 F (37.3 C)] 97.8 F (36.6 C) (12/19 0444) Pulse Rate:  [50-71] 50 (12/19 0444) Resp:  [16-18] 16 (12/19 0444) BP: (106-143)/(60-91) 132/88 (12/19 0444) SpO2:  [99 %-100 %] 100 % (12/19 0444) Last BM Date: 02/22/16 General:   Alert, well-developed, white female in NAD EENT:  Normal hearing, non icteric sclera, conjunctive pink.  Heart:  Regular rate and rhythm; no murmurs.no lower extremity edema Pulm: Normal respiratory effort, lungs CTA bilaterally without wheezes or crackles. Abdomen:  Soft, nondistended, nontender.  Normal bowel sounds, no masses felt.  Neurologic:  Alert and  oriented x4;  grossly normal neurologically. Psych:  Alert and cooperative. Normal mood and affect. Lab Results:  Recent Labs  02/21/16 0535 02/22/16 0524 02/23/16 0513  WBC 6.0 5.3 5.3  HGB 11.6* 11.8* 11.2*  HCT 34.8* 35.0* 34.4*  PLT 207 235 206   BMET  Recent Labs  02/21/16 0535 02/22/16 0524 02/23/16 0513  NA 141 138 139  K 3.1* 3.2* 3.5  CL 108 105 107  CO2 25 26 27   GLUCOSE 112* 128* 126*  BUN <5* <5* 6  CREATININE 0.62 0.69 0.95  CALCIUM 8.1* 8.2* 8.6*    Assessment / Plan:  1. Crohn's disease, on Entyvio. Admitted with N/V/D. CTscan demonstrated chronic inflammation of colon and anastomotic segment of SB but also showed some mild colonic mucosal enhancement in descending colon segment possibly representing more acute process. Doubt Crohn's flare, especially since improved without steroids. The GI path panel negative. Viral etiology still possible. Also, she was off pancreatic enzymes at home which could have contributed to the diarrhea.  2. Pancreatic insuff. Had stopped Creon because insurance wouldn't pay. Creon has been  restarted, loose stools improving. I have contacted Beth (office nurse) who will work on finding out which supplement her insurance will pay for or getting her enrolled in assistance programs. Checking to see if we have samples of enzymes at the office in the interim  3. Hypokalemia, resolved    LOS: 3 days   Tye Savoy NP 02/23/2016, 9:08 AM  Pager number [phone]  ________________________________________________________________________  Velora Heckler GI MD note:  I personally examined the patient, reviewed the data and agree with the assessment and plan described above.  She is doing great, only 2bms  Today which is much better.  She is safe for discharge from a GI perspective as soon as a reliable supply of pancreatic enzymes is lined up.     Owens Loffler, MD [facility] Pager [phone]

## 2016-02-23 NOTE — Progress Notes (Signed)
Patient ID: [name], female   DOB: [birth year], 55 y.o.   MRN: [id number]  PROGRESS NOTE    [name]  WPV:[id number] DOB: [birth year] DOA: 02/20/2016  PCP: [name], DO   Brief Narrative:   55 y.o.femalewith past medical history significant for Asthma, Crohn's disease, HTN, HLD, GERD, psoriasis, kidney stones who presented to [facility] with progressively worsening nausea, vomiting, diarrhea and left lower quadrant abdominal pain. She is followed by [facility] gastroenterology, Dr. Silverio Decamp and has been receiving Vedolizumab every other month since 2016. Over the summer, she developed a transaminitis and her azathioprine was discontinued.  Her budesonide was discontinued as well during that time. Although she was feeling well, gaining weight and had controlled diarrhea at that time, ever since those changes were made she has had progressive nausea, diarrhea and left lower abdominal pain. Symptoms progressed rapidly over the past month prior to this admission.  In ED, blood work was notable for low potassium of 2.6, magnesium 1.5, both were replaced. White blood cell count was within normal limits and patient was afebrile. CT of abdomen and pelvis demonstrated diffuse fatty wall thickening throughout the colon and the anastomotic segment of small bowel consistent with chronic inflammation. She had a small area of colonic mucosal enhancement along the descending segment which was suspicious for superimposed acute colitis.  She was started on antibiotics and surgery has seen her in consultation.   Assessment & Plan:  Acute colitis with nausea, vomiting and abdominal pain, likely due to a Crohn's flare.  - C.diff negative - GI virus panel is negative  - Continue Rocephin and Flagyl - Diet advanced to regular - Had 3 BM's in past 24 hours - Home once cleared by GI  Pancreatic insufficiency - Continue lipase supplementation  Hypokalemia, hypomagnesemia - Due to GI losses -  Supplemented and WNL  GERD - Continue PPI (protonix)  Crohn's disease ([place]) - Verified compliance with her Entyvio   Asthma - Not in acute exacerbation   Essential hypertension - Increase Norvasc to 10 mg a day   Chronic Pain - Continue Oxycodone IR   DVT prophylaxis:  Lovenox subQ Code Status:  Full code Family Communication:  Family not at the bedside this am  Disposition Plan:  Home once GI clears    Consultants:   [facility], Dr. Silverio Decamp   Procedures:   None   Antimicrobials:   Rocephin and flagyl 02/20/2016 -->   Subjective: Abdominal pain better.  Objective: Vitals:   02/22/16 0410 02/22/16 1324 02/22/16 2124 02/23/16 0444  BP: (!) 175/94 (!) 143/91 106/60 132/88  Pulse: 78 71 (!) 58 (!) 50  Resp: 16 18 18 16   Temp: 99.7 F (37.6 C) 99 F (37.2 C) 99.1 F (37.3 C) 97.8 F (36.6 C)  TempSrc: Oral Oral Oral Oral  SpO2: 98% 99% 99% 100%  Weight:      Height:        Intake/Output Summary (Last 24 hours) at 02/23/16 1250 Last data filed at 02/23/16 0600  Gross per 24 hour  Intake          1381.67 ml  Output                0 ml  Net          1381.67 ml   Filed Weights   02/20/16 1512  Weight: 80.1 kg (176 lb 8 oz)    Examination:  General exam: Appears calm and comfortable, no distress  Respiratory system: Clear  to auscultation. No wheezing  Cardiovascular system: S1 & S2 heard, Rate controlled  Gastrointestinal system: (+) BS< non tender  Central nervous system: No focal neurological deficits. Extremities: No edema, palpable pulses  Skin: No rashes, lesions or ulcers, skin is warm and dry  Psychiatry: Mood & affect appropriate.   Data Reviewed: I have personally reviewed following labs and imaging studies  CBC:  Recent Labs Lab 02/20/16 1553 02/20/16 2219 02/21/16 0535 02/22/16 0524 02/23/16 0513  WBC 7.5 6.6 6.0 5.3 5.3  HGB 12.8 12.0 11.6* 11.8* 11.2*  HCT 36.9 35.3* 34.8* 35.0* 34.4*  MCV 88.5  89.1 90.6 90.9 92.5  PLT 272 235 207 235 099   Basic Metabolic Panel:  Recent Labs Lab 02/20/16 1553 02/20/16 1750 02/20/16 2219 02/21/16 0535 02/22/16 0524 02/23/16 0513  NA 143  --  141 141 138 139  K 2.6*  --  2.8* 3.1* 3.2* 3.5  CL 107  --  106 108 105 107  CO2 26  --  27 25 26 27   GLUCOSE 107*  --  111* 112* 128* 126*  BUN 8  --  6 <5* <5* 6  CREATININE 0.83  --  0.77 0.62 0.69 0.95  CALCIUM 8.9  --  8.5* 8.1* 8.2* 8.6*  MG  --  1.5*  --   --   --  1.8   GFR: Estimated Creatinine Clearance: 72 mL/min (by C-G formula based on SCr of 0.95 mg/dL). Liver Function Tests:  Recent Labs Lab 02/20/16 1553  AST 24  ALT 29  ALKPHOS 107  BILITOT 0.9  PROT 7.3  ALBUMIN 3.7    Recent Labs Lab 02/20/16 1553  LIPASE 19   No results for input(s): AMMONIA in the last 168 hours. Coagulation Profile: No results for input(s): INR, PROTIME in the last 168 hours. Cardiac Enzymes: No results for input(s): CKTOTAL, CKMB, CKMBINDEX, TROPONINI in the last 168 hours. BNP (last 3 results) No results for input(s): PROBNP in the last 8760 hours. HbA1C: No results for input(s): HGBA1C in the last 72 hours. CBG: No results for input(s): GLUCAP in the last 168 hours. Lipid Profile: No results for input(s): CHOL, HDL, LDLCALC, TRIG, CHOLHDL, LDLDIRECT in the last 72 hours. Thyroid Function Tests: No results for input(s): TSH, T4TOTAL, FREET4, T3FREE, THYROIDAB in the last 72 hours. Anemia Panel:  Recent Labs  02/21/16 0853  VITAMINB12 242  FOLATE 18.2  FERRITIN 150  TIBC 370  IRON 51   Urine analysis:    Component Value Date/Time   COLORURINE YELLOW 02/20/2016 1723   APPEARANCEUR CLEAR 02/20/2016 1723   LABSPEC 1.025 02/20/2016 1723   PHURINE 6.0 02/20/2016 1723   GLUCOSEU NEGATIVE 02/20/2016 1723   HGBUR TRACE (A) 02/20/2016 1723   BILIRUBINUR MODERATE (A) 02/20/2016 1723   KETONESUR 15 (A) 02/20/2016 1723   PROTEINUR TRACE (A) 02/20/2016 1723   UROBILINOGEN 0.2  03/29/2013 0919   NITRITE NEGATIVE 02/20/2016 1723   LEUKOCYTESUR NEGATIVE 02/20/2016 1723   Sepsis Labs: @LABRCNTIP (procalcitonin:4,lacticidven:4)   Recent Results (from the past 240 hour(s))  C difficile quick scan w PCR reflex     Status: None   Collection Time: 02/20/16 10:25 PM  Result Value Ref Range Status   C Diff antigen NEGATIVE NEGATIVE Final   C Diff toxin NEGATIVE NEGATIVE Final   C Diff interpretation No C. difficile detected.  Final  Gastrointestinal Panel by PCR , Stool     Status: None   Collection Time: 02/21/16  2:00 PM  Result Value  Ref Range Status   Campylobacter species NOT DETECTED NOT DETECTED Final   Plesimonas shigelloides NOT DETECTED NOT DETECTED Final   Salmonella species NOT DETECTED NOT DETECTED Final   Yersinia enterocolitica NOT DETECTED NOT DETECTED Final   Vibrio species NOT DETECTED NOT DETECTED Final   Vibrio cholerae NOT DETECTED NOT DETECTED Final   Enteroaggregative E coli (EAEC) NOT DETECTED NOT DETECTED Final   Enteropathogenic E coli (EPEC) NOT DETECTED NOT DETECTED Final   Enterotoxigenic E coli (ETEC) NOT DETECTED NOT DETECTED Final   Shiga like toxin producing E coli (STEC) NOT DETECTED NOT DETECTED Final   Shigella/Enteroinvasive E coli (EIEC) NOT DETECTED NOT DETECTED Final   Cryptosporidium NOT DETECTED NOT DETECTED Final   Cyclospora cayetanensis NOT DETECTED NOT DETECTED Final   Entamoeba histolytica NOT DETECTED NOT DETECTED Final   Giardia lamblia NOT DETECTED NOT DETECTED Final   Adenovirus F40/41 NOT DETECTED NOT DETECTED Final   Astrovirus NOT DETECTED NOT DETECTED Final   Norovirus GI/GII NOT DETECTED NOT DETECTED Final   Rotavirus A NOT DETECTED NOT DETECTED Final   Sapovirus (I, II, IV, and V) NOT DETECTED NOT DETECTED Final      Radiology Studies: Ct Abdomen Pelvis W Contrast Result Date: 02/20/2016  1. Diffuse fatty wall thickening throughout the colon and the anastomotic segment of small bowel is compatible  with chronic inflammation. Mild colonic mucosal enhancement most pronounced in the descending segment may represent superimposed acute colitis. 2. No evidence for bowel obstruction. No peritoneal collection or abscess. Electronically Signed   By: Kristine Garbe M.D.   On: 02/20/2016 20:21      Scheduled Meds: . amLODipine  10 mg Oral Daily  . enoxaparin (LOVENOX) injection  40 mg Subcutaneous QHS  . hydrocortisone-pramoxine  1 application Rectal TID  . levETIRAcetam  250 mg Oral BID  . lipase/protease/amylase  36,000 Units Oral TID AC  . multivitamin with minerals  1 tablet Oral Daily  . pantoprazole  80 mg Oral Daily  . tiZANidine  4 mg Oral TID   Continuous Infusions: . dextrose 5 % and 0.45 % NaCl with KCl 40 mEq/L 50 mL/hr at 02/22/16 2133     LOS: 3 days    Time spent: 15 minutes  Greater than 50% of the time spent on counseling and coordinating the care.   Leisa Lenz, MD [facility] Pager [phone]  If 7PM-7AM, please contact night-coverage [email or web address] Password [email or web address] 02/23/2016, 12:50 PM

## 2016-02-24 ENCOUNTER — Other Ambulatory Visit: Payer: Self-pay | Admitting: Gastroenterology

## 2016-02-24 LAB — CBC
HCT: 34.9 % — ABNORMAL LOW (ref 36.0–46.0)
Hemoglobin: 11.3 g/dL — ABNORMAL LOW (ref 12.0–15.0)
MCH: 30.1 pg (ref 26.0–34.0)
MCHC: 32.4 g/dL (ref 30.0–36.0)
MCV: 93.1 fL (ref 78.0–100.0)
PLATELETS: 207 10*3/uL (ref 150–400)
RBC: 3.75 MIL/uL — AB (ref 3.87–5.11)
RDW: 12.8 % (ref 11.5–15.5)
WBC: 5.6 10*3/uL (ref 4.0–10.5)

## 2016-02-24 LAB — BASIC METABOLIC PANEL
Anion gap: 5 (ref 5–15)
BUN: 8 mg/dL (ref 6–20)
CALCIUM: 8.7 mg/dL — AB (ref 8.9–10.3)
CHLORIDE: 106 mmol/L (ref 101–111)
CO2: 28 mmol/L (ref 22–32)
CREATININE: 0.82 mg/dL (ref 0.44–1.00)
GFR calc non Af Amer: >60 mL/min (ref >60)
GLUCOSE: 108 mg/dL — AB (ref 65–99)
Potassium: 3.5 mmol/L (ref 3.5–5.1)
Sodium: 139 mmol/L (ref 135–145)

## 2016-02-24 MED ORDER — PANCRELIPASE (LIP-PROT-AMYL) 36000-114000 UNITS PO CPEP: 36000.0000 [IU] | ORAL_CAPSULE | Freq: Three times a day (TID) | ORAL | 0 refills | Status: DC

## 2016-02-24 NOTE — Progress Notes (Signed)
Patient discharged home with husband. Given discharge AVS and discussed all information including upcoming appointments, new medications, and next doses due of all medications. Patient expressed understanding. IV removed and transported to exit with NT.

## 2016-02-24 NOTE — Progress Notes (Signed)
Appreciate Case Management's help with [place]. Patient for discharge today. Follow up made with me for 03/10/16 at 8:30am

## 2016-02-24 NOTE — Discharge Instructions (Signed)
Crohn Disease Crohn disease is a long-lasting (chronic) disease that affects your gastrointestinal (GI) tract. It often causes irritation and swelling (inflammation) in your small intestine and the beginning of your large intestine. However, it can affect any part of your GI tract. Crohn disease is part of a group of illnesses that are known as inflammatory bowel disease (IBD). Crohn disease may start slowly and get worse over time. Symptoms may come and go. They may also disappear for months or even years at a time (remission). What are the causes? The exact cause of Crohn disease is not known. It may be a response that causes your body's defense system (immune system) to mistakenly attack healthy cells and tissues (autoimmune response). Your genes and your environment may also play a role. What increases the risk? You may be at greater risk for Crohn disease if you:  Have other family members with Crohn disease or another IBD.  Use any tobacco products, including cigarettes, chewing tobacco, or electronic cigarettes.  Are in your 20s.  Have [place] European ancestry. What are the signs or symptoms? The main signs and symptoms of Crohn disease involve your GI tract. These include:  Diarrhea.  Rectal bleeding.  An urgent need to move your bowels.  The feeling that you are not finished having a bowel movement.  Abdominal pain or cramping.  Constipation. General signs and symptoms of Crohn disease may also include:  Unexplained weight loss.  Fatigue.  Fever.  Nausea.  Loss of appetite.  Joint pain  Changes in vision.  Red bumps on your skin. How is this diagnosed? Your health care provider may suspect Crohn disease based on your symptoms and your medical history. Your health care provider will do a physical exam. You may need to see a health care provider who specializes in diseases of the digestive tract (gastroenterologist). You may also have tests to help your health  care providers make a diagnosis. These may include:  Blood tests.  Stool sample tests.  Imaging tests, such as X-rays and CT scans.  Tests to examine the inside of your intestines using a long, flexible tube that has a light and a camera on the end (endoscopy or colonoscopy).  A procedure to take tissue samples from inside your bowel (biopsy) to be examined under a microscope. How is this treated? There is no cure for Crohn disease. Treatment will focus on managing your symptoms. Crohn disease affects each person differently. Your treatment may include:  Resting your bowels. Drinking only clear liquids or getting nutrition through an IV for a period of time gives your bowels a chance to heal because they are not passing stools.  Medicines. These may be used alone or in combination (combination therapy). These may include antibiotic medicines. You may be given medicines that help to:  Reduce inflammation.  Control your immune system activity.  Fight infections.  Relieve cramps and prevent diarrhea.  Control your pain.  Surgery. You may need surgery if:  Medicines and other treatments are no longer working.  You develop complications from severe Crohn disease.  A section of your intestine becomes so damaged that it needs to be removed. Follow these instructions at home:  Take medicines only as directed by your health care provider.  If you were prescribed an antibiotic medicine, finish it all even if you start to feel better.  Keep all follow-up visits as directed by your health care provider. This is important.  Talk with your health care provider about changing  your diet. This may help your symptoms. Your health care provide may recommend changes, such as:  Drinking more fluids.  Avoiding milk and other foods that contain lactose.  Eating a low-fat diet.  Avoiding high-fiber foods, such as popcorn and nuts.  Avoiding carbonated beverages, such as soda.  Eating  smaller meals more often rather than eating large meals.  Keeping a food diary to identify foods that make your symptoms better or worse.  Do not use any tobacco products, including cigarettes, chewing tobacco, or electronic cigarettes. If you need help quitting, ask your health care provider.  Limit alcohol intake to no more than 1 drink per day for nonpregnant women and 2 drinks per day for men. One drink equals 12 ounces of beer, 5 ounces of wine, or 1 ounces of hard liquor.  Exercise daily or as directed by your health care provider. Contact a health care provider if:  You have diarrhea, abdominal cramps, and other gastrointestinal problems that are present almost all of the time.  Your symptoms do not improve with treatment.  You continue to lose weight.  You develop a rash or sores on your skin.  You develop eye problems.  You have a fever.  Your symptoms get worse.  You develop new symptoms. Get help right away if:  You have bloody diarrhea.  You develop severe abdominal pain.  You cannot pass stools. This information is not intended to replace advice given to you by your health care provider. Make sure you discuss any questions you have with your health care provider. Document Released: 12/01/2004 Document Revised: 07/02/2015 Document Reviewed: 10/09/2013 Elsevier Interactive Patient Education  2017 [employer].

## 2016-02-24 NOTE — Discharge Summary (Signed)
Physician Discharge Summary  [name] HEN:[id number] DOB: 09-01-1960 DOA: 02/20/2016  PCP: [name], DO  Admit date: 02/20/2016 Discharge date: 02/24/2016  Recommendations for Outpatient Follow-up:  1. Continue pancreatic lipase supplementation on discharge   Discharge Diagnoses:  Principal Problem:   Nausea & vomiting  Active Problems:   GERD   Crohn's disease (HCC)   Asthma   Abdominal pain   Hypokalemia   Hypomagnesemia   Colitis   Pancreatic insufficiency    Discharge Condition: stable   Diet recommendation: as tolerated   History of present illness:  55 y.o.femalewith past medical history significant for Asthma, Crohn's disease, HTN, HLD, GERD, psoriasis, kidney stones who presented to [facility] with progressively worsening nausea, vomiting, diarrhea and left lower quadrant abdominal pain. She is followed by [facility], Dr. Silverio Decamp and has been receiving Vedolizumab every other month since 2016. Over the summer, she developed a transaminitis and her azathioprine was discontinued. Her budesonide was discontinued as well during that time. Although she was feeling well, gaining weight and had controlled diarrhea at that time, ever since those changes were made she has had progressive nausea, diarrhea and left lower abdominal pain. Symptoms progressed rapidly over the past month prior to this admission.  In ED, blood work was notable for low potassium of 2.6, magnesium 1.5, both were replaced. White blood cell count was within normal limits and patient was afebrile. CT of abdomen and pelvis demonstrated diffuse fatty wall thickening throughout the colon and the anastomotic segment of small bowel consistent with chronic inflammation. She had a small area of colonic mucosal enhancement along the descending segment which was suspicious for superimposed acute colitis.   Hospital Course:   Assessment & Plan:  Acute colitis with nausea, vomiting and  abdominal pain, likely due to a Crohn's flare.  - C.diff negative - GI virus panel is negative  - Stop Rocephin and Flagyl prior  - Tolerates regular diet    Pancreatic insufficiency - Continue lipase supplementation  Hypokalemia, hypomagnesemia - Due to GI losses - Supplemented and WNL  GERD - Continue PPI (protonix)  Crohn's disease ([place]) - Verified compliance with her Entyvio   Asthma - Not in acute exacerbation   Essential hypertension - Stop Norvasc, BP in low 100's  Chronic Pain - Continue Oxycodone IR   DVT prophylaxis:Lovenox subQ Code Status:Full code Family Communication:Husband at the bedside this am     Consultants:  [facility], Dr. Silverio Decamp   Procedures:   None   Antimicrobials:   Rocephin and flagyl 02/20/2016 --> 02/24/2016    Signed:  Leisa Lenz, MD  [facility] 02/24/2016, 9:53 AM  Pager #: [phone]  Time spent in minutes: less than 30 minutes   Discharge Exam: Vitals:   02/23/16 2059 02/24/16 0507  BP: (!) 95/53 109/69  Pulse: (!) 56 (!) 54  Resp: 16 18  Temp: 98.8 F (37.1 C) 97.9 F (36.6 C)   Vitals:   02/23/16 0444 02/23/16 1356 02/23/16 2059 02/24/16 0507  BP: 132/88 112/62 (!) 95/53 109/69  Pulse: (!) 50 64 (!) 56 (!) 54  Resp: 16 16 16 18   Temp: 97.8 F (36.6 C) 98.2 F (36.8 C) 98.8 F (37.1 C) 97.9 F (36.6 C)  TempSrc: Oral Oral Oral Oral  SpO2: 100% 99% 98% 98%  Weight:      Height:        General: Pt is alert, follows commands appropriately, not in acute distress Cardiovascular: Regular rate and rhythm, S1/S2 + Respiratory: Clear to  auscultation bilaterally, no wheezing, no crackles, no rhonchi Abdominal: Soft, non tender, non distended, bowel sounds +, no guarding Extremities: no edema, no cyanosis, pulses palpable bilaterally DP and PT Neuro: Grossly nonfocal  Discharge Instructions  Discharge Instructions    Call MD for:  persistant nausea  and vomiting    Complete by:  As directed    Call MD for:  redness, tenderness, or signs of infection (pain, swelling, redness, odor or green/yellow discharge around incision site)    Complete by:  As directed    Call MD for:  severe uncontrolled pain    Complete by:  As directed    Diet - low sodium heart healthy    Complete by:  As directed    Increase activity slowly    Complete by:  As directed      Allergies as of 02/24/2016      Reactions   Codeine Hives, Nausea And Vomiting   Humira [adalimumab] Rash      Medication List    STOP taking these medications   econazole nitrate 1 % cream   ranitidine 150 MG tablet Commonly known as:  ZANTAC     TAKE these medications   cyanocobalamin 1000 MCG/ML injection Commonly known as:  (VITAMIN B-12) Inject 1 mL (1,000 mcg total) into the skin every 30 (thirty) days.   eletriptan 40 MG tablet Commonly known as:  RELPAX Take 40 mg by mouth as needed for migraine. Take 81m at onset of headache, may repeat in 2 hours if needed. Not to exceed 2 tabs in 24 hours   ENTYVIO IV Inject into the vein every 8 (eight) weeks.   gabapentin 400 MG capsule Commonly known as:  NEURONTIN Take 400 mg by mouth 2 (two) times daily.   hydrocortisone 1 % Crea Commonly known as:  PROCTOCORT Use rectally as needed   levETIRAcetam 250 MG tablet Commonly known as:  KEPPRA Take 250 mg by mouth 2 (two) times daily.   lipase/protease/amylase 36000 UNITS Cpep capsule Commonly known as:  CREON Take 1 capsule (36,000 Units total) by mouth 3 (three) times daily before meals. What changed:  how much to take  how to take this  when to take this  additional instructions   Needles & Syringes Misc 1 Syringe by Does not apply route every 30 (thirty) days.   omeprazole 40 MG capsule Commonly known as:  PRILOSEC Take 1 capsule (40 mg total) by mouth daily.   oxycodone 5 MG capsule Commonly known as:  OXY-IR Take 5 mg by mouth every 4 (four)  hours as needed for pain.   PROAIR HFA 108 (90 Base) MCG/ACT inhaler Generic drug:  albuterol Inhale 2 puffs into the lungs every 4 (four) hours as needed for wheezing or shortness of breath.   promethazine 25 MG suppository Commonly known as:  PHENERGAN Place 1 suppository (25 mg total) rectally every 6 (six) hours as needed for nausea or vomiting.   promethazine 25 MG tablet Commonly known as:  PHENERGAN TAKE ONE TABLET BY MOUTH EVERY 8 HOURS AS NEEDED FOR NAUSEA/VOMITING   rizatriptan 10 MG disintegrating tablet Commonly known as:  MAXALT-MLT Take 10 mg by mouth as needed for migraine. Take 156mat onset of headache, may repeat in 2 hours if needed. Not to exceed 2 tabs in 24 hours   SYMBICORT 80-4.5 MCG/ACT inhaler Generic drug:  budesonide-formoterol Inhale 2 puffs into the lungs daily as needed (SOB, wheezing).   tiZANidine 4 MG tablet Commonly known as:  ZANAFLEX Take 4 mg by mouth 3 (three) times daily.      Follow-up Information    Tye Savoy, NP Follow up on 03/10/2016.   Specialty:  Gastroenterology Why:  at 8:30am Contact information: [place] [address] [phone]        [name], [place]. Schedule an appointment as soon as possible for a visit in 2 week(s).   Specialty:  Family Medicine Contact information: [place] [address] [phone]            The results of significant diagnostics from this hospitalization (including imaging, microbiology, ancillary and laboratory) are listed below for reference.    Significant Diagnostic Studies: Ct Abdomen Pelvis W Contrast  Result Date: 02/20/2016 CLINICAL DATA:  55 y/o F; severe emesis and diarrhea. Left lower quadrant abdominal pain. History of Crohn's disease. EXAM: CT ABDOMEN AND PELVIS WITH CONTRAST TECHNIQUE: Multidetector CT imaging of the abdomen and pelvis was performed using the standard protocol following bolus administration of intravenous  contrast. CONTRAST:  11m ISOVUE-300 IOPAMIDOL (ISOVUE-300) INJECTION 61% COMPARISON:  03/29/2013 CT of abdomen and pelvis FINDINGS: Lower chest: No acute abnormality. Hepatobiliary: No focal liver abnormality is seen. Status post cholecystectomy. Stable mild intra and extrahepatic biliary ductal dilatation which is probably compensatory post cholecystectomy. No obstructing stone or lesion identified. Pancreas: Unremarkable. No pancreatic ductal dilatation or surrounding inflammatory changes. Spleen: Normal in size without focal abnormality. Adrenals/Urinary Tract: Left kidney upper pole 7 mm exophytic fluid attenuating structure compatible with cyst. No urinary stone disease. No obstructive uropathy. Normal bladder. 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 There is diffuse fatty wall thickening throughout the colon and anastomosis loop of small bowel at the right upper quadrant and tear colonic anastomosis compatible with chronic inflammatory change. A component of acute colitis may be present in the descending segment of colon as there appears to be mild mucosal enhancement (series 4, image 80). There are no obstructive changes of bowel. Vascular/Lymphatic: Aortic atherosclerosis. No enlarged abdominal or pelvic lymph nodes. Reproductive: Status post hysterectomy. No adnexal masses. Other: Ventral midline anterior abdominal wall postsurgical changes with tiny fat containing hernias. Musculoskeletal: Sclerotic changes within the femoral heads bilaterally and subchondral lucency is compatible with avascular necrosis and stable. No acute osseous abnormality is evident. IMPRESSION: 1. Diffuse fatty wall thickening throughout the colon and the anastomotic segment of small bowel is compatible with chronic inflammation. Mild colonic mucosal enhancement most pronounced in the descending segment may represent superimposed acute colitis. 2. No evidence for bowel obstruction.  No peritoneal collection or abscess. Electronically Signed   By: LKristine GarbeM.D.   On: 02/20/2016 20:21    Microbiology: Recent Results (from the past 240 hour(s))  C difficile quick scan w PCR reflex     Status: None   Collection Time: 02/20/16 10:25 PM  Result Value Ref Range Status   C Diff antigen NEGATIVE NEGATIVE Final   C Diff toxin NEGATIVE NEGATIVE Final   C Diff interpretation No C. difficile detected.  Final  Gastrointestinal Panel by PCR , Stool     Status: None   Collection Time: 02/21/16  2:00 PM  Result Value Ref Range Status   Campylobacter species NOT DETECTED NOT DETECTED Final   Plesimonas shigelloides NOT DETECTED NOT DETECTED Final   Salmonella species NOT DETECTED NOT DETECTED Final   Yersinia enterocolitica NOT DETECTED NOT DETECTED Final   Vibrio species NOT DETECTED NOT DETECTED Final  Vibrio cholerae NOT DETECTED NOT DETECTED Final   Enteroaggregative E coli (EAEC) NOT DETECTED NOT DETECTED Final   Enteropathogenic E coli (EPEC) NOT DETECTED NOT DETECTED Final   Enterotoxigenic E coli (ETEC) NOT DETECTED NOT DETECTED Final   Shiga like toxin producing E coli (STEC) NOT DETECTED NOT DETECTED Final   Shigella/Enteroinvasive E coli (EIEC) NOT DETECTED NOT DETECTED Final   Cryptosporidium NOT DETECTED NOT DETECTED Final   Cyclospora cayetanensis NOT DETECTED NOT DETECTED Final   Entamoeba histolytica NOT DETECTED NOT DETECTED Final   Giardia lamblia NOT DETECTED NOT DETECTED Final   Adenovirus F40/41 NOT DETECTED NOT DETECTED Final   Astrovirus NOT DETECTED NOT DETECTED Final   Norovirus GI/GII NOT DETECTED NOT DETECTED Final   Rotavirus A NOT DETECTED NOT DETECTED Final   Sapovirus (I, II, IV, and V) NOT DETECTED NOT DETECTED Final     Labs: Basic Metabolic Panel:  Recent Labs Lab 02/20/16 1750 02/20/16 2219 02/21/16 0535 02/22/16 0524 02/23/16 0513 02/24/16 0542  NA  --  141 141 138 139 139  K  --  2.8* 3.1* 3.2* 3.5 3.5  CL  --   106 108 105 107 106  CO2  --  27 25 26 27 28   GLUCOSE  --  111* 112* 128* 126* 108*  BUN  --  6 <5* <5* 6 8  CREATININE  --  0.77 0.62 0.69 0.95 0.82  CALCIUM  --  8.5* 8.1* 8.2* 8.6* 8.7*  MG 1.5*  --   --   --  1.8  --    Liver Function Tests:  Recent Labs Lab 02/20/16 1553  AST 24  ALT 29  ALKPHOS 107  BILITOT 0.9  PROT 7.3  ALBUMIN 3.7    Recent Labs Lab 02/20/16 1553  LIPASE 19   No results for input(s): AMMONIA in the last 168 hours. CBC:  Recent Labs Lab 02/20/16 2219 02/21/16 0535 02/22/16 0524 02/23/16 0513 02/24/16 0542  WBC 6.6 6.0 5.3 5.3 5.6  HGB 12.0 11.6* 11.8* 11.2* 11.3*  HCT 35.3* 34.8* 35.0* 34.4* 34.9*  MCV 89.1 90.6 90.9 92.5 93.1  PLT 235 207 235 206 207   Cardiac Enzymes: No results for input(s): CKTOTAL, CKMB, CKMBINDEX, TROPONINI in the last 168 hours. BNP: BNP (last 3 results) No results for input(s): BNP in the last 8760 hours.  ProBNP (last 3 results) No results for input(s): PROBNP in the last 8760 hours.  CBG: No results for input(s): GLUCAP in the last 168 hours.

## 2016-03-02 ENCOUNTER — Other Ambulatory Visit: Payer: Self-pay

## 2016-03-02 DIAGNOSIS — K5000 Crohn's disease of small intestine without complications: Secondary | ICD-10-CM

## 2016-03-03 ENCOUNTER — Other Ambulatory Visit: Payer: Self-pay

## 2016-03-03 DIAGNOSIS — K5010 Crohn's disease of large intestine without complications: Secondary | ICD-10-CM

## 2016-03-09 DIAGNOSIS — I10 Essential (primary) hypertension: Secondary | ICD-10-CM | POA: Diagnosis not present

## 2016-03-09 DIAGNOSIS — R51 Headache: Secondary | ICD-10-CM | POA: Diagnosis not present

## 2016-03-09 DIAGNOSIS — B372 Candidiasis of skin and nail: Secondary | ICD-10-CM | POA: Diagnosis not present

## 2016-03-10 ENCOUNTER — Ambulatory Visit: Payer: Medicare Other | Admitting: Nurse Practitioner

## 2016-03-10 ENCOUNTER — Telehealth: Payer: Self-pay | Admitting: Nurse Practitioner

## 2016-03-10 DIAGNOSIS — M47817 Spondylosis without myelopathy or radiculopathy, lumbosacral region: Secondary | ICD-10-CM | POA: Diagnosis not present

## 2016-03-10 DIAGNOSIS — Z79899 Other long term (current) drug therapy: Secondary | ICD-10-CM | POA: Diagnosis not present

## 2016-03-10 DIAGNOSIS — M4696 Unspecified inflammatory spondylopathy, lumbar region: Secondary | ICD-10-CM | POA: Diagnosis not present

## 2016-03-10 DIAGNOSIS — G894 Chronic pain syndrome: Secondary | ICD-10-CM | POA: Diagnosis not present

## 2016-03-10 DIAGNOSIS — M5137 Other intervertebral disc degeneration, lumbosacral region: Secondary | ICD-10-CM | POA: Diagnosis not present

## 2016-03-10 DIAGNOSIS — Z79891 Long term (current) use of opiate analgesic: Secondary | ICD-10-CM | POA: Diagnosis not present

## 2016-03-15 ENCOUNTER — Encounter (HOSPITAL_COMMUNITY)
Admission: RE | Admit: 2016-03-15 | Discharge: 2016-03-15 | Disposition: A | Discharge status: Home / Self Care | Payer: BLUE CROSS/BLUE SHIELD | Source: Ambulatory Visit | Attending: Gastroenterology | Admitting: Gastroenterology

## 2016-03-15 ENCOUNTER — Encounter (HOSPITAL_COMMUNITY): Payer: Self-pay

## 2016-03-15 DIAGNOSIS — K50119 Crohn's disease of large intestine with unspecified complications: Secondary | ICD-10-CM | POA: Diagnosis not present

## 2016-03-15 DIAGNOSIS — K5000 Crohn's disease of small intestine without complications: Secondary | ICD-10-CM

## 2016-03-15 MED ORDER — SODIUM CHLORIDE 0.9 % IV SOLN
Freq: Once | INTRAVENOUS | Status: AC
  Administered 2016-03-15: 09:00:00 via INTRAVENOUS

## 2016-03-15 MED ORDER — SODIUM CHLORIDE 0.9 % IV SOLN
300.0000 mg | Freq: Once | INTRAVENOUS | Status: AC
  Administered 2016-03-15: 300 mg via INTRAVENOUS
  Filled 2016-03-15: qty 5

## 2016-03-16 ENCOUNTER — Other Ambulatory Visit: Payer: Self-pay

## 2016-03-16 DIAGNOSIS — M7060 Trochanteric bursitis, unspecified hip: Secondary | ICD-10-CM | POA: Diagnosis not present

## 2016-03-16 DIAGNOSIS — K5000 Crohn's disease of small intestine without complications: Secondary | ICD-10-CM

## 2016-03-21 ENCOUNTER — Encounter: Payer: Self-pay | Admitting: Neurology

## 2016-03-21 ENCOUNTER — Ambulatory Visit (INDEPENDENT_AMBULATORY_CARE_PROVIDER_SITE_OTHER): Payer: BLUE CROSS/BLUE SHIELD | Admitting: Neurology

## 2016-03-21 VITALS — BP 114/82 | HR 88 | Ht 67.0 in | Wt 182.8 lb

## 2016-03-21 DIAGNOSIS — R2681 Unsteadiness on feet: Secondary | ICD-10-CM

## 2016-03-21 DIAGNOSIS — R9082 White matter disease, unspecified: Secondary | ICD-10-CM

## 2016-03-21 DIAGNOSIS — R202 Paresthesia of skin: Secondary | ICD-10-CM

## 2016-03-21 DIAGNOSIS — R200 Anesthesia of skin: Secondary | ICD-10-CM

## 2016-03-21 DIAGNOSIS — G43109 Migraine with aura, not intractable, without status migrainosus: Secondary | ICD-10-CM

## 2016-03-21 DIAGNOSIS — R930 Abnormal findings on diagnostic imaging of skull and head, not elsewhere classified: Secondary | ICD-10-CM | POA: Diagnosis not present

## 2016-03-21 MED ORDER — TOPIRAMATE 50 MG PO TABS: 50.0000 mg | ORAL_TABLET | Freq: Every day | ORAL | 3 refills | Status: DC

## 2016-03-21 NOTE — Patient Instructions (Signed)
I think the MRI findings are "small vessel disease" which is due to tiny blood vessels getting clogged up (often seen in people with history of high blood pressure). I think the headaches with hallucinations are migraines  Migraine Recommendations: 1.  STOP KEPPRA.  Start topiramate 38m at bedtime.  Call in 4 weeks with update and we can adjust dose if needed. 2.  STOP SUMATRIPTAN.  Take Z[place], 1 spray in nostril at earliest onset of headache.  May repeat dose once after 2 hours if needed.  Do not exceed two sprays in 24 hours. 3.  Limit use of pain relievers to no more than 2 days out of the week.  These medications include acetaminophen, ibuprofen, triptans and narcotics.  This will help reduce risk of rebound headaches. 4.  Be aware of common food triggers such as processed sweets, processed foods with nitrites (such as deli meat, hot dogs, sausages), foods with MSG, alcohol (such as wine), chocolate, certain cheeses, certain fruits (dried fruits, some citrus fruit), vinegar, diet soda. 4.  Avoid caffeine 5.  Routine exercise 6.  Proper sleep hygiene 7.  Stay adequately hydrated with water 8.  Keep a headache diary. 9.  Maintain proper stress management. 10.  Do not skip meals. 11.  Consider supplements:  Magnesium citrate 402mto 60039maily, riboflavin 400m21moenzyme Q 10 100mg57mee times daily 12.  Will get MRI of cervical spine with and without contrast and EEG 13.  Follow up in 3 months, but contact me in 4 weeks with update of headaches.

## 2016-03-21 NOTE — Progress Notes (Signed)
NEUROLOGY CONSULTATION NOTE  [name] MRN: [id number] DOB: 06-08-1960  Referring provider: Dr. Theda Sers Primary care provider: Dr. Theda Sers  Reason for consult:  Headache, abnormal brain MRI  HISTORY OF PRESENT ILLNESS: [name] is a 56 year old right-handed female with Crohn's disease and hypertension who presents for headaches and abnormal brain MRI.  History supplemented by PCP note.  In September, she began having several new symptoms. 1.  HEADACHE: Location:  Bi-temporal/parietal Quality:  Vice-like Intensity:  10/10 Aura:  No preceding aura, but on occasion has associated visual hallucinations of dead people or children in the house (has occurred with 3 headaches over past month). Prodrome:  no Associated symptoms:  nausea, vomiting, photophobia, phonophobia, sees spots in her vision.  She has not had any new worse headache of her life, waking up from sleep Duration:  Several hours Frequency:  Every other night. Triggers/exacerbating factors:  no Relieving factors:  no Activity:  aggravates  Past NSAIDS:  ibuprofen Past analgesics:  Excedrin, Tylenol Past abortive triptans:  Maxalt, Relpax Past muscle relaxants:  no Past anti-emetic:  no Past antihypertensive medications:  no Past antidepressant medications:  no Past anticonvulsant medications:  no Past vitamins/Herbal/Supplements:  no Past antihistamines/decongestants:  no Other past therapies:  no  Current NSAIDS:  Contraindicated (Crohn's disease) Current analgesics:  oxycodone 81m (for chronic back pain) Current triptans:  Sumatriptan 577m(ineffective) Current anti-emetic:  promethazine 2521murrent muscle relaxants:  tizanidine 4mg8mrrent anti-anxiolytic:  no Current sleep aide:  no Current Antihypertensive medications:  amlodipine Current Antidepressant medications:  no Current Anticonvulsant medications:  gabapentin 400mg26mce daily, Keppra 250mg 54me daily (started for migraine  prevention) Current Vitamins/Herbal/Supplements:  B12 Current Antihistamines/Decongestants:  no Other therapy:  no  Caffeine:  Decaf tea Alcohol:  no Smoker:  no Diet:  Hydrates. Depression/anxiety:  no Sleep hygiene:  poor Family history of headache:  No family history of migraine.  No personal history of migraine.  2.  GAIT INSTABILITY: On occasion, her legs will give out or she will just lose her balance.  She has fallen 3 times in the past month.  She denies headache or radicular pain down the legs.  PT was minimally helpful.  3.  LEFT ARM NUMBNESS: At times it feels like that her left arm suddenly "falls asleep" from shoulder down to hand and fingers.  There is no associated neck pain or radicular pain.  Often it occurs when sitting.  She needs to shake or rub her arm.  It resolves in about 30 minutes.  It occurs once a week.  In September 2017, she began seeing objects or people that are not there and one time her spouse saw her talking to somebody not there.  She had an MRI of the brain with and without contrast on 01/22/16, which demonstrated few nonspecific hyperintensities in the cerebral white matter clustered in the left central hemipshere (new since prior image from 07/14/04) as well as patch signal abnormality in the pons (stable compared to prior imaging from 07/14/04), but no acute abnormalities.    She was being followed by neurology at [facility].  [facility] is on Entyvio for Crohn's disease and they recommended LP to rule out PML.    Labs:  Sed Rate was elevated at 85 (has Crohn's disease) and B12 was low at 187.  RPR was nonreactive; TSH 1.206, free T3 2.58, T4 0.9; CBC with WBC 10.1, HGB 13.6, HCT 40.5, PLT 302 and ALC 2.6; CMP with Na 140, K  3.9, glucose 82, BUN 13, Cr 0.89, total bili 0.6, ALP 154, AST 21 and ALT 26.   She reports no new medications.  She was admitted to the hospital from 02/20/16 to 02/24/16 for Crohn's flare with acute colitis. Otherwise, she reports no fever or  illness preceding onset of symptoms.   PAST MEDICAL HISTORY: Past Medical History:  Diagnosis Date  . Arthritis   . Chronic diarrhea   . Chronic disease anemia   . Crohn's colitis ([place])   . Crohn's disease ([place])   . Depression   . Family history of malignant neoplasm of gastrointestinal tract   . GERD (gastroesophageal reflux disease)   . H/O hiatal hernia   . History of adenomatous polyp of colon   . History of gastritis    AND HX ILEITIS  . History of kidney stones   . History of small bowel obstruction    SECONDARY CROHN'S STRICTURE  S/P COLECTOMY  . Hyperlipidemia   . Hypertension   . Hypopotassemia   . Internal hemorrhoids   . Kidney stones   . PONV (postoperative nausea and vomiting)   . Psoriasis    SKIN  . Right ureteral stone   . Rotavirus infection 05/31/2013  . S/P dilatation of esophageal stricture   . Seasonal asthma   . Urgency of urination   . Vitamin B12 deficiency     PAST SURGICAL HISTORY: Past Surgical History:  Procedure Laterality Date  . ANAL FISSURE REPAIR  1990's  . CARPAL TUNNEL RELEASE Right 2000  . CHOLECYSTECTOMY  2002  . CYSTOSCOPY WITH RETROGRADE PYELOGRAM, URETEROSCOPY AND STENT PLACEMENT Left 03/08/2013   Procedure: CYSTOSCOPY WITH RETROGRADE PYELOGRAM, URETEROSCOPY AND STENT PLACEMENT stone basketing;  Surgeon: Alexis Frock, MD;  Location: [facility];  Service: Urology;  Laterality: Left;  . CYSTOSCOPY WITH RETROGRADE PYELOGRAM, URETEROSCOPY AND STENT PLACEMENT Right 04/03/2013   Procedure: CYSTOSCOPY WITH RETROGRADE PYELOGRAM, URETEROSCOPY AND STENT PLACEMENT;  Surgeon: Alexis Frock, MD;  Location: [facility];  Service: Urology;  Laterality: Right;  . DX LAPAROSCOPY CONVERTED TO OPEN RIGHT COLECCTOMY  09-15-2010   ANASTOMOTIC STRICTURE  . LAPAROSCOPIC ILEOCECECTOMY  10-09-2008   AND APPENDECTOMY (TERMINAL ILEITIS & PARTIAL SBO)  . VAGINAL HYSTERECTOMY  1992    MEDICATIONS: Current Outpatient Prescriptions on File  Prior to Visit  Medication Sig Dispense Refill  . amLODipine (NORVASC) 10 MG tablet Take 10 mg by mouth daily.    . cyanocobalamin (,VITAMIN B-12,) 1000 MCG/ML injection Inject 1 mL (1,000 mcg total) into the skin every 30 (thirty) days. 1 mL 11  . gabapentin (NEURONTIN) 400 MG capsule Take 400 mg by mouth 2 (two) times daily.     . hydrocortisone (PROCTOCORT) 1 % CREA Use rectally as needed 1 Tube 3  . lipase/protease/amylase (CREON) 36000 UNITS CPEP capsule Take 1 capsule (36,000 Units total) by mouth 3 (three) times daily before meals. 270 capsule 0  . Needles & Syringes MISC 1 Syringe by Does not apply route every 30 (thirty) days. 12 each 0  . omeprazole (PRILOSEC) 40 MG capsule Take 1 capsule (40 mg total) by mouth daily. 30 capsule 12  . oxycodone (OXY-IR) 5 MG capsule Take 5 mg by mouth every 4 (four) hours as needed for pain.    . promethazine (PHENERGAN) 25 MG suppository Place 1 suppository (25 mg total) rectally every 6 (six) hours as needed for nausea or vomiting. 12 each 2  . tiZANidine (ZANAFLEX) 4 MG tablet Take 4 mg by mouth 3 (three)  times daily.    . Vedolizumab (ENTYVIO IV) Inject into the vein every 8 (eight) weeks.    Marland Kitchen albuterol (PROAIR HFA) 108 (90 BASE) MCG/ACT inhaler Inhale 2 puffs into the lungs every 4 (four) hours as needed for wheezing or shortness of breath.     . budesonide-formoterol (SYMBICORT) 80-4.5 MCG/ACT inhaler Inhale 2 puffs into the lungs daily as needed (SOB, wheezing).      No current facility-administered medications on file prior to visit.     ALLERGIES: Allergies  Allergen Reactions  . Codeine Hives and Nausea And Vomiting  . Humira [Adalimumab] Rash    FAMILY HISTORY: Family History  Problem Relation Age of Onset  . Colon cancer Mother   . Colon polyps Mother   . Colon polyps Father   . Lung cancer Father   . Breast cancer Paternal Grandmother   . Colon polyps Brother   . Thyroid disease Neg Hx     SOCIAL HISTORY: Social History     Social History  . Marital status: Married    Spouse name: N/A  . Number of children: 2  . Years of education: N/A   Occupational History  . cook [employer]   Social History Main Topics  . Smoking status: Former Smoker    Packs/day: 1.00    Years: 10.00    Types: Cigarettes    Quit date: 03/07/1984  . Smokeless tobacco: Never Used  . Alcohol use No  . Drug use: No  . Sexual activity: Not on file   Other Topics Concern  . Not on file   Social History Narrative  . No narrative on file    REVIEW OF SYSTEMS: Constitutional: No fevers, chills, or sweats, no generalized fatigue, change in appetite Eyes: No visual changes, double vision, eye pain Ear, nose and throat: No hearing loss, ear pain, nasal congestion, sore throat Cardiovascular: No chest pain, palpitations Respiratory:  No shortness of breath at rest or with exertion, wheezes GastrointestinaI: Crohn's disease Genitourinary:  No dysuria, urinary retention or frequency Musculoskeletal:  No neck pain, back pain Integumentary: No rash, pruritus, skin lesions Neurological: as above Psychiatric: No depression, insomnia, anxiety Endocrine: No palpitations, fatigue, diaphoresis, mood swings, change in appetite, change in weight, increased thirst Hematologic/Lymphatic:  No purpura, petechiae. Allergic/Immunologic: no itchy/runny eyes, nasal congestion, recent allergic reactions, rashes  PHYSICAL EXAM: Vitals:   03/21/16 1451  BP: 114/82  Pulse: 88   General: No acute distress.  Patient appears well-groomed.  Head:  Normocephalic/atraumatic Eyes:  fundi examined but not visualized Neck: supple, no paraspinal tenderness, full range of motion Back: No paraspinal tenderness Heart: regular rate and rhythm Lungs: Clear to auscultation bilaterally. Vascular: No carotid bruits. Neurological Exam: Mental status: alert and oriented to person, place, and time, recent and remote memory intact, fund of knowledge intact,  attention and concentration intact, speech fluent and not dysarthric, language intact. Cranial nerves: CN I: not tested CN II: pupils equal, round and reactive to light, visual fields intact CN III, IV, VI:  full range of motion, no nystagmus, no ptosis CN V: facial sensation intact CN VII: upper and lower face symmetric CN VIII: hearing intact CN IX, X: gag intact, uvula midline CN XI: sternocleidomastoid and trapezius muscles intact CN XII: tongue midline Bulk & Tone: normal, no fasciculations. Motor:  5/5 throughout  Sensation: temperature and vibration sensation intact. Deep Tendon Reflexes:  2+ throughout, toes downgoing.  Finger to nose testing:  Without dysmetria.  Heel to shin:  Without  dysmetria.  Gait:  Normal station and stride.  Able to turn and tandem walk. Romberg negative.  IMPRESSION: 1.  I suspect that the headaches are migraine (albeit late onset). I do not suspect PML and therefore I feel it is premature to pursue LP at this time. 2.  Hallucinations.  As they only occur with the headache, likely aura/complex migraine. 3.  Abnormal white matter on brain MRI.  It looks like chronic small vessel ischemic changes.  I don't suspect demyelinating disease or vasculitis. 4.  Left arm numbness. Likely peripheral etiology 5.  Gait instability.  Unclear etiology   PLAN: 1.  We will stop Keppra and instead start topiramate 71m at bedtime for migraine prevention.  Side effects discussed. 2.  We will stop sumatriptan and instead start Zomig 544mNS 3.  Discussed lifestyle modification, medication overuse 4.  Regarding hallucinations, will check EEG (although strongly suspect migraine) 5.  Will check MRI of cervical spine with and without contrast to evaluate for a source of gait instability and left arm numbness. 6.  Follow up in 3 months   Thank you for allowing me to take part in the care of this patient.  AdMetta ClinesDO  CC:  DaJanie MorningDO

## 2016-03-23 ENCOUNTER — Ambulatory Visit: Payer: Medicare Other | Admitting: Nurse Practitioner

## 2016-03-28 ENCOUNTER — Ambulatory Visit (INDEPENDENT_AMBULATORY_CARE_PROVIDER_SITE_OTHER): Payer: BLUE CROSS/BLUE SHIELD | Admitting: Neurology

## 2016-03-28 DIAGNOSIS — R9082 White matter disease, unspecified: Secondary | ICD-10-CM

## 2016-03-28 DIAGNOSIS — R930 Abnormal findings on diagnostic imaging of skull and head, not elsewhere classified: Secondary | ICD-10-CM

## 2016-03-28 NOTE — Progress Notes (Signed)
ELECTROENCEPHALOGRAM REPORT  Date of Study: 03/28/2016  Patient's Name: [name] MRN: [id number] Date of Birth: 08-Jun-1960  Clinical History: 56 year old female with late onset headaches and hallucinations.  Medications: NORVASC VITAMIN B-12 NEURONTIN PROCTOCORT CREON PRILOSEC OXY-IR  PHENERGAN ZANAFLEX ENTYVIO IV  PROAIR HFA  SYMBICORT  Technical Summary: A multichannel digital EEG recording measured by the international 10-20 system with electrodes applied with paste and impedances below 5000 ohms performed in our laboratory with EKG monitoring in an awake and drowsy patient.  Hyperventilation and photic stimulation were performed.  The digital EEG was referentially recorded, reformatted, and digitally filtered in a variety of bipolar and referential montages for optimal display.    Description: The patient is awake and drowsy during the recording.  During maximal wakefulness, there is a symmetric, medium voltage 8 Hz posterior dominant rhythm that attenuates with eye opening.  The record is symmetric.  During drowsiness and sleep, there is an increase in theta slowing of the background.  Vertex waves and symmetric sleep spindles were seen.  Hyperventilation and photic stimulation did not elicit any abnormalities.  There were no epileptiform discharges or electrographic seizures seen.    EKG lead was unremarkable.  Impression: This awake and drowsy EEG is normal.    Clinical Correlation: A normal EEG does not exclude a clinical diagnosis of epilepsy.  If further clinical questions remain, prolonged EEG may be helpful.  Clinical correlation is advised.   Metta Clines, DO

## 2016-03-29 DIAGNOSIS — K50919 Crohn's disease, unspecified, with unspecified complications: Secondary | ICD-10-CM | POA: Diagnosis not present

## 2016-03-29 DIAGNOSIS — L0232 Furuncle of buttock: Secondary | ICD-10-CM | POA: Diagnosis not present

## 2016-03-29 DIAGNOSIS — R112 Nausea with vomiting, unspecified: Secondary | ICD-10-CM | POA: Diagnosis not present

## 2016-04-06 ENCOUNTER — Telehealth: Payer: Self-pay

## 2016-04-06 NOTE — Telephone Encounter (Signed)
Spoke to patient. Gave EEG results.  Patient verbalized understanding.

## 2016-04-06 NOTE — Telephone Encounter (Signed)
-----   Message from Pieter Partridge, DO sent at 03/28/2016  4:38 PM EST ----- EEG is normal

## 2016-04-07 ENCOUNTER — Telehealth: Payer: Self-pay | Admitting: Neurology

## 2016-04-07 DIAGNOSIS — Z79899 Other long term (current) drug therapy: Secondary | ICD-10-CM | POA: Diagnosis not present

## 2016-04-07 DIAGNOSIS — M47817 Spondylosis without myelopathy or radiculopathy, lumbosacral region: Secondary | ICD-10-CM | POA: Diagnosis not present

## 2016-04-07 DIAGNOSIS — G894 Chronic pain syndrome: Secondary | ICD-10-CM | POA: Diagnosis not present

## 2016-04-07 DIAGNOSIS — Z79891 Long term (current) use of opiate analgesic: Secondary | ICD-10-CM | POA: Diagnosis not present

## 2016-04-07 DIAGNOSIS — M4696 Unspecified inflammatory spondylopathy, lumbar region: Secondary | ICD-10-CM | POA: Diagnosis not present

## 2016-04-07 DIAGNOSIS — M5137 Other intervertebral disc degeneration, lumbosacral region: Secondary | ICD-10-CM | POA: Diagnosis not present

## 2016-04-07 NOTE — Telephone Encounter (Signed)
PT called and said the samples Dr Tomi Likens gave her for headaches is working really well and wanted to know if he could call her in a prescription/Dawn CB#[phone]

## 2016-04-07 NOTE — Telephone Encounter (Signed)
Spoke to patient. She states Zomig samples worked great. Requesting Rx.

## 2016-04-08 MED ORDER — PROMETHAZINE HCL 25 MG PO TABS: 25.0000 mg | ORAL_TABLET | Freq: Four times a day (QID) | ORAL | 0 refills | Status: DC | PRN

## 2016-04-08 NOTE — Telephone Encounter (Signed)
Patient rescheduled 03/23/16 appointment for 04/21/16, but patient would like a call for advice on what to do till then from problems

## 2016-04-08 NOTE — Telephone Encounter (Signed)
We can prescribe her Zomig 34m, 1 spray in one nostril.  May repeat 1 spray once after 2 hours if needed. Not to exceed 2 sprays in 24 hours.

## 2016-04-08 NOTE — Telephone Encounter (Signed)
She reports intermittent vomiting.  She requests a refill of po phenergan.  Rx sent in.  I moved her appt up to 04/15/16 3:00

## 2016-04-11 ENCOUNTER — Ambulatory Visit
Admission: RE | Admit: 2016-04-11 | Discharge: 2016-04-11 | Disposition: A | Discharge status: Home / Self Care | Payer: BLUE CROSS/BLUE SHIELD | Source: Ambulatory Visit | Attending: Neurology | Admitting: Neurology

## 2016-04-11 DIAGNOSIS — R9082 White matter disease, unspecified: Secondary | ICD-10-CM

## 2016-04-11 DIAGNOSIS — R200 Anesthesia of skin: Secondary | ICD-10-CM

## 2016-04-11 DIAGNOSIS — R202 Paresthesia of skin: Secondary | ICD-10-CM

## 2016-04-11 DIAGNOSIS — R2681 Unsteadiness on feet: Secondary | ICD-10-CM

## 2016-04-11 DIAGNOSIS — M50222 Other cervical disc displacement at C5-C6 level: Secondary | ICD-10-CM | POA: Diagnosis not present

## 2016-04-11 MED ORDER — ZOLMITRIPTAN 5 MG NA SOLN: NASAL | 0 refills | Status: AC

## 2016-04-11 MED ORDER — GADOBENATE DIMEGLUMINE 529 MG/ML IV SOLN
17.0000 mL | Freq: Once | INTRAVENOUS | Status: AC | PRN
  Administered 2016-04-11: 17 mL via INTRAVENOUS

## 2016-04-11 NOTE — Telephone Encounter (Signed)
Spoke to patient. Confirmed ok to send Rx to pharmacy on file. Advised of prior-auth process if needed. Patient verbalized understanding.

## 2016-04-11 NOTE — Addendum Note (Signed)
Addended by: Milta Deiters on: 04/11/2016 08:58 AM   Modules accepted: Orders

## 2016-04-12 ENCOUNTER — Telehealth: Payer: Self-pay

## 2016-04-12 NOTE — Telephone Encounter (Signed)
-----   Message from Pieter Partridge, DO sent at 04/12/2016 11:53 AM EST ----- MRI of cervical spine shows a couple of disc bulges but nothing severe enough that would be affecting her balance.  At this point, I do not have a neurologic explanation for her falls due to leg weakness.

## 2016-04-12 NOTE — Telephone Encounter (Signed)
Called patient. Gave MRI results. Patient verbalized understanding.

## 2016-04-13 ENCOUNTER — Telehealth: Payer: Self-pay

## 2016-04-13 NOTE — Telephone Encounter (Signed)
Raven from pharmacy called stating patient request alternative drug for Zomig. Unable to afford. Called patient to inform Copay cards available. No answer.

## 2016-04-15 ENCOUNTER — Encounter: Payer: Self-pay | Admitting: Nurse Practitioner

## 2016-04-15 ENCOUNTER — Ambulatory Visit (INDEPENDENT_AMBULATORY_CARE_PROVIDER_SITE_OTHER): Payer: BLUE CROSS/BLUE SHIELD | Admitting: Nurse Practitioner

## 2016-04-15 VITALS — BP 124/78 | HR 82 | Ht 67.0 in | Wt 178.0 lb

## 2016-04-15 DIAGNOSIS — K50119 Crohn's disease of large intestine with unspecified complications: Secondary | ICD-10-CM

## 2016-04-15 DIAGNOSIS — R112 Nausea with vomiting, unspecified: Secondary | ICD-10-CM | POA: Diagnosis not present

## 2016-04-15 MED ORDER — ONDANSETRON HCL 4 MG PO TABS: 4.0000 mg | ORAL_TABLET | Freq: Two times a day (BID) | ORAL | 3 refills | Status: DC

## 2016-04-15 NOTE — Patient Instructions (Signed)
If you are age 56 or older, your body mass index should be between 23-30. Your Body mass index is 27.88 kg/m. If this is out of the aforementioned range listed, please consider follow up with your Primary Care Provider.  If you are age 22 or younger, your body mass index should be between 19-25. Your Body mass index is 27.88 kg/m. If this is out of the aformentioned range listed, please consider follow up with your Primary Care Provider.   We have sent the following medications to your pharmacy for you to pick up at your convenience:  Zofran take twice daily  Creon- Increase to 2 tablets with meals  You have been scheduled for an endoscopy. Please follow written instructions given to you at your visit today. If you use inhalers (even only as needed), please bring them with you on the day of your procedure. Your physician has requested that you go to www.startemmi.com and enter the access code given to you at your visit today. This web site gives a general overview about your procedure. However, you should still follow specific instructions given to you by our office regarding your preparation for the procedure.  Thank you.

## 2016-04-15 NOTE — Progress Notes (Signed)
HPI: Patient is a 56 year old female known to Dr. Silverio Decamp. She has a history of small bowel and large bowel Crohn's disease status post terminal ileal resection in 2008 in 2012. She is currently maintained on Entyvio Q 8 weeks. Patient also has a history of pancreatic insufficiency and takes Creon. She was hospitalized mid December with nausea, vomiting, and diarrhea. CT scan showed chronic changes throughout the colon and the anastomotic segment of the small bowel. There was suggestion of possible acute segmental colitis involving descending colon. Antibiotics were started but steroids were held pending stool studies which returned negative. Her symptoms improved. As it turns out patient had been off Creon because she could not afford it. Symptoms were not felt to represent a Crohn's flare. She improved and was discharged home on Creon 1 tablet with each meal. Patient notes that she used to take 2 Creon with each meal.   Since discharge patient has continued to have daily nausea and vomiting several times a week. She is eating very small meals. The only new medication she has started is norvasc. She complains of crampy mid abdominal pain.  Stools vary in consistency and are often greasy. No fevers.      Past Medical History:  Diagnosis Date  . Arthritis   . Chronic diarrhea   . Chronic disease anemia   . Crohn's colitis ([place])   . Crohn's disease ([place])   . Depression   . Family history of malignant neoplasm of gastrointestinal tract   . GERD (gastroesophageal reflux disease)   . H/O hiatal hernia   . History of adenomatous polyp of colon   . History of gastritis    AND HX ILEITIS  . History of kidney stones   . History of small bowel obstruction    SECONDARY CROHN'S STRICTURE  S/P COLECTOMY  . Hyperlipidemia   . Hypertension   . Hypopotassemia   . Internal hemorrhoids   . Kidney stones   . PONV (postoperative nausea and vomiting)   . Psoriasis    SKIN  . Right ureteral stone     . Rotavirus infection 05/31/2013  . S/P dilatation of esophageal stricture   . Seasonal asthma   . Urgency of urination   . Vitamin B12 deficiency     Patient's surgical history, family medical history, social history, medications and allergies were all reviewed in Epic    Physical Exam: BP 124/78   Pulse 82   Ht 5' 7"  (1.702 m)   Wt 178 lb (80.7 kg)   BMI 27.88 kg/m   GENERAL: pleasant white female in NAD PSYCH: :Pleasant, cooperative, normal affect HEENT: Normocephalic, conjunctiva pink, mucous membranes moist, neck supple without masses CARDIAC:  RRR, no murmur heard, no peripheral edema PULM: Normal respiratory effort, lungs CTA bilaterally, no wheezing ABDOMEN:  Soft, nondistended, mild-moderate LLQ tenderness. No obvious masses,  normal bowel sounds SKIN:  turgor, no lesions seen Musculoskeletal:  Normal muscle tone, normal strength NEURO: Alert and oriented x 3, no focal neurologic deficits   ASSESSMENT and PLAN:  7. 56 year old female with small and large bowel Crohn's disease s/p terminal ileal resection in 2008 in 2012. Maintained on Entyvio Q8 hours. Recent admission for N/V/D. Chronic changes on CTscan except for possible superimposed mild segmental descending colitis. Stool studies were negative. Patient has pancreatic insufficiency, turns out she was off Creon for financial reasons. She was discharged home on creon but on a reduced dose . Diarrhea has overall improved but stools are oily.  Still having nausea and vomiting almost daily for unclear reasons.  -For further evaluation patient will be scheduled for EGD -trial of scheduled Zofran BID   2. Pancreatic insufficiency. Having steatorhea, underreplaced with Creon. Need to increase creon from 1 to 2 with meals.      Tye Savoy , NP 04/15/2016, 3:46 PM

## 2016-04-15 NOTE — Progress Notes (Deleted)
[name]    [id number]    [birth year]  Primary Care Physician:COLLINS, Hinton Dyer, DO  Referring Physician: [name], DO [place], [place] [address]  Chief complaint:  ***  HPI:  Upper abdomen and lower abdomen pain Nexium twice daily  Bowel movements multiple with more diarrhea than constipation  Outpatient Encounter Prescriptions as of 04/15/2016  Medication Sig  . albuterol (PROAIR HFA) 108 (90 BASE) MCG/ACT inhaler Inhale 2 puffs into the lungs every 4 (four) hours as needed for wheezing or shortness of breath.   Marland Kitchen amLODipine (NORVASC) 10 MG tablet Take 10 mg by mouth daily.  . budesonide-formoterol (SYMBICORT) 80-4.5 MCG/ACT inhaler Inhale 2 puffs into the lungs daily as needed (SOB, wheezing).   . cyanocobalamin (,VITAMIN B-12,) 1000 MCG/ML injection Inject 1 mL (1,000 mcg total) into the skin every 30 (thirty) days.  Marland Kitchen gabapentin (NEURONTIN) 400 MG capsule Take 600 mg by mouth 2 (two) times daily.   . hydrocortisone (PROCTOCORT) 1 % CREA Use rectally as needed  . lipase/protease/amylase (CREON) 36000 UNITS CPEP capsule Take 1 capsule (36,000 Units total) by mouth 3 (three) times daily before meals.  . Needles & Syringes MISC 1 Syringe by Does not apply route every 30 (thirty) days.  Marland Kitchen omeprazole (PRILOSEC) 40 MG capsule Take 1 capsule (40 mg total) by mouth daily.  Marland Kitchen oxycodone (OXY-IR) 5 MG capsule Take 5 mg by mouth every 4 (four) hours as needed for pain.  . promethazine (PHENERGAN) 25 MG suppository Place 1 suppository (25 mg total) rectally every 6 (six) hours as needed for nausea or vomiting.  . promethazine (PHENERGAN) 25 MG tablet Take 1 tablet (25 mg total) by mouth every 6 (six) hours as needed for nausea or vomiting.  Marland Kitchen tiZANidine (ZANAFLEX) 4 MG tablet Take 4 mg by mouth 3 (three) times daily.  Marland Kitchen topiramate (TOPAMAX) 50 MG tablet Take 1 tablet (50 mg total) by mouth at bedtime.  . Vedolizumab (ENTYVIO IV) Inject into the vein  every 8 (eight) weeks.  Marland Kitchen zolmitriptan (ZOMIG) 5 MG nasal solution 1 spray in one nostril.  May repeat 1 spray once after 2 hours if needed. Not to exceed 2 sprays in 24 hours   No facility-administered encounter medications on file as of 04/15/2016.     Allergies as of 04/15/2016 - Review Complete 04/15/2016  Allergen Reaction Noted  . Codeine Hives and Nausea And Vomiting 08/31/2007  . Humira [adalimumab] Rash 12/24/2013    Past Medical History:  Diagnosis Date  . Arthritis   . Chronic diarrhea   . Chronic disease anemia   . Crohn's colitis ([place])   . Crohn's disease ([place])   . Depression   . Family history of malignant neoplasm of gastrointestinal tract   . GERD (gastroesophageal reflux disease)   . H/O hiatal hernia   . History of adenomatous polyp of colon   . History of gastritis    AND HX ILEITIS  . History of kidney stones   . History of small bowel obstruction    SECONDARY CROHN'S STRICTURE  S/P COLECTOMY  . Hyperlipidemia   . Hypertension   . Hypopotassemia   . Internal hemorrhoids   . Kidney stones   . PONV (postoperative nausea and vomiting)   . Psoriasis    SKIN  . Right ureteral stone   . Rotavirus infection 05/31/2013  . S/P dilatation of esophageal stricture   . Seasonal asthma   .  Urgency of urination   . Vitamin B12 deficiency     Past Surgical History:  Procedure Laterality Date  . ANAL FISSURE REPAIR  1990's  . CARPAL TUNNEL RELEASE Right 2000  . CHOLECYSTECTOMY  2002  . CYSTOSCOPY WITH RETROGRADE PYELOGRAM, URETEROSCOPY AND STENT PLACEMENT Left 03/08/2013   Procedure: CYSTOSCOPY WITH RETROGRADE PYELOGRAM, URETEROSCOPY AND STENT PLACEMENT stone basketing;  Surgeon: Alexis Frock, MD;  Location: [facility];  Service: Urology;  Laterality: Left;  . CYSTOSCOPY WITH RETROGRADE PYELOGRAM, URETEROSCOPY AND STENT PLACEMENT Right 04/03/2013   Procedure: CYSTOSCOPY WITH RETROGRADE PYELOGRAM, URETEROSCOPY AND STENT PLACEMENT;  Surgeon: Alexis Frock, MD;   Location: [facility];  Service: Urology;  Laterality: Right;  . DX LAPAROSCOPY CONVERTED TO OPEN RIGHT COLECCTOMY  09-15-2010   ANASTOMOTIC STRICTURE  . LAPAROSCOPIC ILEOCECECTOMY  10-09-2008   AND APPENDECTOMY (TERMINAL ILEITIS & PARTIAL SBO)  . VAGINAL HYSTERECTOMY  1992    Family History  Problem Relation Age of Onset  . Colon cancer Mother   . Colon polyps Mother   . Colon polyps Father   . Lung cancer Father   . Breast cancer Paternal Grandmother   . Colon polyps Brother   . Thyroid disease Neg Hx     Social History   Social History  . Marital status: Married    Spouse name: N/A  . Number of children: 2  . Years of education: N/A   Occupational History  . cook [employer]   Social History Main Topics  . Smoking status: Former Smoker    Packs/day: 1.00    Years: 10.00    Types: Cigarettes    Quit date: 03/07/1984  . Smokeless tobacco: Never Used  . Alcohol use No  . Drug use: No  . Sexual activity: Not on file   Other Topics Concern  . Not on file   Social History Narrative  . No narrative on file      Review of systems: Review of Systems  Constitutional: Negative for fever and chills.  HENT: Negative.   Eyes: Negative for blurred vision.  Respiratory: Negative for cough, shortness of breath and wheezing.   Cardiovascular: Negative for chest pain and palpitations.  Gastrointestinal: as per HPI Genitourinary: Negative for dysuria, urgency, frequency and hematuria.  Musculoskeletal: Negative for myalgias, back pain and joint pain.  Skin: Negative for itching and rash.  Neurological: Negative for dizziness, tremors, focal weakness, seizures and loss of consciousness.  Endo/Heme/Allergies: Positive for seasonal allergies.  Psychiatric/Behavioral: Negative for depression, suicidal ideas and hallucinations.  All other systems reviewed and are negative.   Physical Exam: Vitals:   04/15/16 1459  BP: 124/78  Pulse: 82   Body mass index  is 27.88 kg/m. Gen:      No acute distress HEENT:  EOMI, sclera anicteric Neck:     No masses; no thyromegaly Lungs:    Clear to auscultation bilaterally; normal respiratory effort CV:         Regular rate and rhythm; no murmurs Abd:      + bowel sounds; soft, non-tender; no palpable masses, no distension Ext:    No edema; adequate peripheral perfusion Skin:      Warm and dry; no rash Neuro: alert and oriented x 3 Psych: normal mood and affect  Data Reviewed:  Reviewed labs, radiology imaging, old records and pertinent past GI work up   Assessment and Plan/Recommendations:  ***  25 minutes was spent face-to-face with the patient. Greater than 50% of the time  used for counseling as well as treatment plan and follow-up. She had multiple questions which were answered to her satisfaction  K. Denzil Magnuson , MD [phone] Mon-Fri 8a-5p [phone] after 5p, weekends, holidays  CC: [name], DO

## 2016-04-18 NOTE — Progress Notes (Signed)
Reviewed and agree with documentation and assessment and plan. K. Veena Khiry Pasquariello , MD   

## 2016-04-21 ENCOUNTER — Ambulatory Visit: Payer: Medicare Other | Admitting: Nurse Practitioner

## 2016-04-21 ENCOUNTER — Encounter: Payer: Self-pay | Admitting: Gastroenterology

## 2016-04-28 ENCOUNTER — Other Ambulatory Visit: Payer: Self-pay | Admitting: Gastroenterology

## 2016-05-03 ENCOUNTER — Ambulatory Visit (AMBULATORY_SURGERY_CENTER): Payer: BLUE CROSS/BLUE SHIELD | Admitting: Gastroenterology

## 2016-05-03 ENCOUNTER — Encounter: Payer: Self-pay | Admitting: Gastroenterology

## 2016-05-03 VITALS — BP 108/68 | HR 71 | Temp 96.6°F | Resp 11 | Ht 67.0 in | Wt 178.0 lb

## 2016-05-03 DIAGNOSIS — F5089 Other specified eating disorder: Secondary | ICD-10-CM

## 2016-05-03 DIAGNOSIS — K209 Esophagitis, unspecified: Secondary | ICD-10-CM | POA: Diagnosis not present

## 2016-05-03 DIAGNOSIS — R1110 Vomiting, unspecified: Secondary | ICD-10-CM

## 2016-05-03 DIAGNOSIS — K5090 Crohn's disease, unspecified, without complications: Secondary | ICD-10-CM | POA: Diagnosis not present

## 2016-05-03 DIAGNOSIS — K5010 Crohn's disease of large intestine without complications: Secondary | ICD-10-CM

## 2016-05-03 DIAGNOSIS — E669 Obesity, unspecified: Secondary | ICD-10-CM | POA: Diagnosis not present

## 2016-05-03 DIAGNOSIS — K219 Gastro-esophageal reflux disease without esophagitis: Secondary | ICD-10-CM | POA: Diagnosis not present

## 2016-05-03 DIAGNOSIS — J45909 Unspecified asthma, uncomplicated: Secondary | ICD-10-CM | POA: Diagnosis not present

## 2016-05-03 DIAGNOSIS — I10 Essential (primary) hypertension: Secondary | ICD-10-CM | POA: Diagnosis not present

## 2016-05-03 MED ORDER — SODIUM CHLORIDE 0.9 % IV SOLN: 500.0000 mL | INTRAVENOUS | Status: DC

## 2016-05-03 NOTE — Progress Notes (Signed)
Report to PACU, RN, vss, BBS= Clear.  

## 2016-05-03 NOTE — Op Note (Signed)
[facility] Patient Name: [name] Procedure Date: 05/03/2016 9:55 AM MRN: [id number] Endoscopist: Mauri Pole , MD Age: 56 Referring MD:  Date of Birth: December 16, 1960 Gender: Female Account #: [id number] Procedure:                Upper GI endoscopy Indications:              Persistent vomiting of unknown cause Medicines:                Monitored Anesthesia Care Procedure:                Pre-Anesthesia Assessment:                           - Prior to the procedure, a History and Physical                            was performed, and patient medications and                            allergies were reviewed. The patient's tolerance of                            previous anesthesia was also reviewed. The risks                            and benefits of the procedure and the sedation                            options and risks were discussed with the patient.                            All questions were answered, and informed consent                            was obtained. Prior Anticoagulants: The patient has                            taken no previous anticoagulant or antiplatelet                            agents. ASA Grade Assessment: III - A patient with                            severe systemic disease. After reviewing the risks                            and benefits, the patient was deemed in                            satisfactory condition to undergo the procedure.                           After obtaining informed consent, the endoscope was  passed under direct vision. Throughout the                            procedure, the patient's blood pressure, pulse, and                            oxygen saturations were monitored continuously. The                            Endoscope was introduced through the mouth, and                            advanced to the third part of duodenum. The upper                            GI  endoscopy was accomplished without difficulty.                            The patient tolerated the procedure well. Scope In: Scope Out: Findings:                 White nummular lesions were noted in the distal                            esophagus. Biopsies were taken with a cold forceps                            for histology.                           A medium amount of food (residue) was found in the                            gastric body.                           The examined duodenum was normal. Complications:            No immediate complications. Estimated Blood Loss:     Estimated blood loss was minimal. Impression:               - White nummular lesions in esophageal mucosa.                            Biopsied to exclude candida esophagitis                           - A medium amount of food (residue) in the stomach                            concerning for gastroparesis                           - Normal examined duodenum. Recommendation:           - Gastroparesis diet.                           -  Avoid fiber and high fat diet                           -Small frequent meals                           - Schedule for gastric emptying study 4 hours                            duration                           - Await pathology results.                           - Return to my office at the next available                            appointment.                           - Continue present medications. Mauri Pole, MD 05/03/2016 10:22:04 AM This report has been signed electronically.

## 2016-05-03 NOTE — Progress Notes (Signed)
Pt's states no medical or surgical changes since previsit or office visit. 

## 2016-05-03 NOTE — Progress Notes (Signed)
Called to room to assist during endoscopic procedure.  Patient ID and intended procedure confirmed with present staff. Received instructions for my participation in the procedure from the performing physician.  

## 2016-05-03 NOTE — Patient Instructions (Signed)
YOU HAD AN ENDOSCOPIC PROCEDURE TODAY AT [facility]:   Refer to the procedure report that was given to you for any specific questions about what was found during the examination.  If the procedure report does not answer your questions, please call your gastroenterologist to clarify.  If you requested that your care partner not be given the details of your procedure findings, then the procedure report has been included in a sealed envelope for you to review at your convenience later.  YOU SHOULD EXPECT: Some feelings of bloating in the abdomen. Passage of more gas than usual.  Walking can help get rid of the air that was put into your GI tract during the procedure and reduce the bloating.   Please Note:  You might notice some irritation and congestion in your nose or some drainage.  This is from the oxygen used during your procedure.  There is no need for concern and it should clear up in a day or so.  SYMPTOMS TO REPORT IMMEDIATELY:    Following upper endoscopy (EGD)  Vomiting of blood or coffee ground material  New chest pain or pain under the shoulder blades  Painful or persistently difficult swallowing  New shortness of breath  Fever of 100F or higher  Black, tarry-looking stools  For urgent or emergent issues, a gastroenterologist can be reached at any hour by calling [phone].   DIET:  We do recommend a Gastroparesis diet until after her gastric emptying study.  Drink plenty of fluids but you should avoid alcoholic beverages for 24 hours.  ACTIVITY:  You should plan to take it easy for the rest of today and you should NOT DRIVE or use heavy machinery until tomorrow (because of the sedation medicines used during the test).    FOLLOW UP: Our staff will call the number listed on your records the next business day following your procedure to check on you and address any questions or concerns that you may have regarding the information given to you following your  procedure. If we do not reach you, we will leave a message.  However, if you are feeling well and you are not experiencing any problems, there is no need to return our call.  We will assume that you have returned to your regular daily activities without incident.  If any biopsies were taken you will be contacted by phone or by letter within the next 1-3 weeks.  Please call us at [phone] if you have not heard about the biopsies in 3 weeks.    SIGNATURES/CONFIDENTIALITY: You and/or your care partner have signed paperwork which will be entered into your electronic medical record.  These signatures attest to the fact that that the information above on your After Visit Summary has been reviewed and is understood.  Full responsibility of the confidentiality of this discharge information lies with you and/or your care-partner.  Gastroparesis diet until after the gastric emptying study.  The CMA for Dr. Silverio Decamp will arrange that for you and given you a call.  Read all of the handouts given to you by your recovery room nburse.

## 2016-05-04 ENCOUNTER — Telehealth: Payer: Self-pay | Admitting: *Deleted

## 2016-05-04 NOTE — Telephone Encounter (Signed)
  Follow up Call-  Call back number 05/03/2016 07/30/2014 05/07/2014  Post procedure Call Back phone  # [phone] OK TO SPEAK WITH HUSBAND.  Permission to leave phone message Yes Yes Yes  Some recent data might be hidden     Patient questions:  Do you have a fever, pain , or abdominal swelling? No. Pain Score  0 *  Have you tolerated food without any problems? No. Patient c/o some nausea but this has been going on since prior to her procedure. No vomiting.  Have you been able to return to your normal activities? Yes.    Do you have any questions about your discharge instructions: Diet   No. Medications  No. Follow up visit  No.  Do you have questions or concerns about your Care? No.  Actions: * If pain score is 4 or above: No action needed, pain <4.

## 2016-05-05 ENCOUNTER — Other Ambulatory Visit: Payer: Self-pay

## 2016-05-05 DIAGNOSIS — Z79891 Long term (current) use of opiate analgesic: Secondary | ICD-10-CM | POA: Diagnosis not present

## 2016-05-05 DIAGNOSIS — M47817 Spondylosis without myelopathy or radiculopathy, lumbosacral region: Secondary | ICD-10-CM | POA: Diagnosis not present

## 2016-05-05 DIAGNOSIS — R112 Nausea with vomiting, unspecified: Secondary | ICD-10-CM

## 2016-05-05 DIAGNOSIS — M5137 Other intervertebral disc degeneration, lumbosacral region: Secondary | ICD-10-CM | POA: Diagnosis not present

## 2016-05-05 DIAGNOSIS — R198 Other specified symptoms and signs involving the digestive system and abdomen: Secondary | ICD-10-CM

## 2016-05-05 DIAGNOSIS — G894 Chronic pain syndrome: Secondary | ICD-10-CM | POA: Diagnosis not present

## 2016-05-05 DIAGNOSIS — M4696 Unspecified inflammatory spondylopathy, lumbar region: Secondary | ICD-10-CM | POA: Diagnosis not present

## 2016-05-05 DIAGNOSIS — Z79899 Other long term (current) drug therapy: Secondary | ICD-10-CM | POA: Diagnosis not present

## 2016-05-05 NOTE — Progress Notes (Signed)
Left a message for the [facility] radiology scheduler with information to schedule. The order is in.

## 2016-05-10 ENCOUNTER — Encounter (HOSPITAL_COMMUNITY)
Admission: RE | Admit: 2016-05-10 | Discharge: 2016-05-10 | Disposition: A | Discharge status: Home / Self Care | Payer: BLUE CROSS/BLUE SHIELD | Source: Ambulatory Visit | Attending: Gastroenterology | Admitting: Gastroenterology

## 2016-05-10 ENCOUNTER — Encounter (HOSPITAL_COMMUNITY): Payer: Self-pay

## 2016-05-10 ENCOUNTER — Other Ambulatory Visit: Payer: Self-pay

## 2016-05-10 ENCOUNTER — Telehealth: Payer: Self-pay | Admitting: Gastroenterology

## 2016-05-10 DIAGNOSIS — K5010 Crohn's disease of large intestine without complications: Secondary | ICD-10-CM

## 2016-05-10 DIAGNOSIS — K50119 Crohn's disease of large intestine with unspecified complications: Secondary | ICD-10-CM | POA: Diagnosis not present

## 2016-05-10 MED ORDER — PANCRELIPASE (LIP-PROT-AMYL) 36000-114000 UNITS PO CPEP: 36000.0000 [IU] | ORAL_CAPSULE | Freq: Two times a day (BID) | ORAL | 3 refills | Status: DC

## 2016-05-10 MED ORDER — VEDOLIZUMAB 300 MG IV SOLR
300.0000 mg | Freq: Once | INTRAVENOUS | Status: AC
  Administered 2016-05-10: 300 mg via INTRAVENOUS
  Filled 2016-05-10: qty 5

## 2016-05-10 MED ORDER — SODIUM CHLORIDE 0.9 % IV SOLN
Freq: Once | INTRAVENOUS | Status: AC
  Administered 2016-05-10: 09:00:00 via INTRAVENOUS

## 2016-05-10 NOTE — Discharge Instructions (Signed)
Entyvio Vedolizumab injection solution What is this medicine? VEDOLIZUMAB (Ve doe LIZ you mab) is used to treat ulcerative colitis and Crohn's disease in adult patients. This medicine may be used for other purposes; ask your health care provider or pharmacist if you have questions. COMMON BRAND NAME(S): Entyvio What should I tell my health care provider before I take this medicine? They need to know if you have any of these conditions: -diabetes -hepatitis B or history of hepatitis B infection -HIV or AIDS -immune system problems -infection or history of infections -liver disease -recently received or scheduled to receive a vaccine -scheduled to have surgery -tuberculosis, a positive skin test for tuberculosis or have recently been in close contact with someone who has tuberculosis - an unusual or allergic reaction to vedolizumab, other medicines, foods, dyes, or preservatives -pregnant or trying to get pregnant -breast-feeding How should I use this medicine? This medicine is for infusion into a vein. It is given by a health care professional in a hospital or clinic setting. A special MedGuide will be given to you by the pharmacist with each prescription and refill. Be sure to read this information carefully each time. Talk to your pediatrician regarding the use of this medicine in children. This medicine is not approved for use in children. Overdosage: If you think you have taken too much of this medicine contact a poison control center or emergency room at once. NOTE: This medicine is only for you. Do not share this medicine with others. What if I miss a dose? It is important not to miss your dose. Call your doctor or health care professional if you are unable to keep an appointment. What may interact with this medicine? -steroid medicines like prednisone or cortisone -TNF-alpha inhibitors like natalizumab, adalimumab, and infliximab -vaccines This list may not describe all  possible interactions. Give your health care provider a list of all the medicines, herbs, non-prescription drugs, or dietary supplements you use. Also tell them if you smoke, drink alcohol, or use illegal drugs. Some items may interact with your medicine. What should I watch for while using this medicine? Your condition will be monitored carefully while you are receiving this medicine. Visit your doctor for regular check ups. Tell your doctor or healthcare professional if your symptoms do not start to get better or if they get worse. Stay away from people who are sick. Call your doctor or health care professional for advice if you get a fever, chills or sore throat, or other symptoms of a cold or flu. Do not treat yourself. In some patients, this medicine may cause a serious brain infection that may cause death. If you have any problems seeing, thinking, speaking, walking, or standing, tell your doctor right away. If you cannot reach your doctor, get urgent medical care. What side effects may I notice from receiving this medicine? Side effects that you should report to your doctor or health care professional as soon as possible: -allergic reactions like skin rash, itching or hives, swelling of the face, lips, or tongue -breathing problems -changes in vision -chest pain -dark urine -depression, feelings of sadness -dizziness -general ill feeling or flu-like symptoms -irregular, missed, or painful menstrual periods -light-colored stools -loss of appetite, nausea -muscle weakness -problems with balance, talking, or walking -right upper belly pain -unusually weak or tired -yellowing of the eyes or skin Side effects that usually do not require medical attention (report to your doctor or health care professional if they continue or are bothersome): -aches, pains -  headache -stomach upset -tiredness This list may not describe all possible side effects. Call your doctor for medical advice about  side effects. You may report side effects to FDA at 1-800-FDA-1088. Where should I keep my medicine? This drug is given in a hospital or clinic and will not be stored at home. NOTE: This sheet is a summary. It may not cover all possible information. If you have questions about this medicine, talk to your doctor, pharmacist, or health care provider.  2018 Elsevier/Gold Standard (2015-03-26 08:36:51)

## 2016-05-10 NOTE — Telephone Encounter (Signed)
Creon sent to [facility] in [place]. Pt made aware

## 2016-05-11 ENCOUNTER — Other Ambulatory Visit: Payer: Self-pay

## 2016-05-11 MED ORDER — PANCRELIPASE (LIP-PROT-AMYL) 36000-114000 UNITS PO CPEP: ORAL_CAPSULE | ORAL | 3 refills | Status: DC

## 2016-05-11 NOTE — Telephone Encounter (Signed)
Resent Creon to pharmacy stating the Creon should be for 2 capsules with each meal.

## 2016-05-11 NOTE — Telephone Encounter (Signed)
Patient has questions regarding creon rx. She states that the rx is supposed to say take two pills with every meal. Best # [phone]

## 2016-05-13 ENCOUNTER — Telehealth: Payer: Self-pay | Admitting: Nurse Practitioner

## 2016-05-16 ENCOUNTER — Encounter: Payer: Self-pay | Admitting: Gastroenterology

## 2016-05-18 ENCOUNTER — Other Ambulatory Visit: Payer: Self-pay | Admitting: Gastroenterology

## 2016-05-24 ENCOUNTER — Telehealth: Payer: Self-pay | Admitting: Gastroenterology

## 2016-05-24 ENCOUNTER — Inpatient Hospital Stay (HOSPITAL_COMMUNITY)
Admission: EM | Admit: 2016-05-24 | Discharge: 2016-06-02 | DRG: 386 | Disposition: A | Discharge status: Home / Self Care | Payer: BLUE CROSS/BLUE SHIELD | Attending: Internal Medicine | Admitting: Internal Medicine

## 2016-05-24 ENCOUNTER — Inpatient Hospital Stay (HOSPITAL_COMMUNITY): Payer: BLUE CROSS/BLUE SHIELD

## 2016-05-24 ENCOUNTER — Encounter (HOSPITAL_COMMUNITY): Payer: Self-pay

## 2016-05-24 ENCOUNTER — Emergency Department (HOSPITAL_COMMUNITY): Payer: BLUE CROSS/BLUE SHIELD

## 2016-05-24 DIAGNOSIS — K56609 Unspecified intestinal obstruction, unspecified as to partial versus complete obstruction: Secondary | ICD-10-CM

## 2016-05-24 DIAGNOSIS — R112 Nausea with vomiting, unspecified: Secondary | ICD-10-CM | POA: Diagnosis not present

## 2016-05-24 DIAGNOSIS — Z87442 Personal history of urinary calculi: Secondary | ICD-10-CM | POA: Diagnosis not present

## 2016-05-24 DIAGNOSIS — E876 Hypokalemia: Secondary | ICD-10-CM | POA: Diagnosis not present

## 2016-05-24 DIAGNOSIS — Z9049 Acquired absence of other specified parts of digestive tract: Secondary | ICD-10-CM

## 2016-05-24 DIAGNOSIS — D638 Anemia in other chronic diseases classified elsewhere: Secondary | ICD-10-CM | POA: Diagnosis present

## 2016-05-24 DIAGNOSIS — R739 Hyperglycemia, unspecified: Secondary | ICD-10-CM | POA: Diagnosis present

## 2016-05-24 DIAGNOSIS — E871 Hypo-osmolality and hyponatremia: Secondary | ICD-10-CM | POA: Diagnosis not present

## 2016-05-24 DIAGNOSIS — K50012 Crohn's disease of small intestine with intestinal obstruction: Secondary | ICD-10-CM | POA: Diagnosis not present

## 2016-05-24 DIAGNOSIS — Z6825 Body mass index (BMI) 25.0-25.9, adult: Secondary | ICD-10-CM | POA: Diagnosis not present

## 2016-05-24 DIAGNOSIS — R1110 Vomiting, unspecified: Secondary | ICD-10-CM | POA: Diagnosis not present

## 2016-05-24 DIAGNOSIS — R790 Abnormal level of blood mineral: Secondary | ICD-10-CM | POA: Diagnosis not present

## 2016-05-24 DIAGNOSIS — D696 Thrombocytopenia, unspecified: Secondary | ICD-10-CM | POA: Diagnosis not present

## 2016-05-24 DIAGNOSIS — R1032 Left lower quadrant pain: Secondary | ICD-10-CM | POA: Diagnosis not present

## 2016-05-24 DIAGNOSIS — E440 Moderate protein-calorie malnutrition: Secondary | ICD-10-CM | POA: Diagnosis present

## 2016-05-24 DIAGNOSIS — K9189 Other postprocedural complications and disorders of digestive system: Secondary | ICD-10-CM

## 2016-05-24 DIAGNOSIS — Z0189 Encounter for other specified special examinations: Secondary | ICD-10-CM

## 2016-05-24 DIAGNOSIS — K5090 Crohn's disease, unspecified, without complications: Secondary | ICD-10-CM | POA: Diagnosis not present

## 2016-05-24 DIAGNOSIS — R1084 Generalized abdominal pain: Secondary | ICD-10-CM | POA: Diagnosis not present

## 2016-05-24 DIAGNOSIS — F329 Major depressive disorder, single episode, unspecified: Secondary | ICD-10-CM | POA: Diagnosis present

## 2016-05-24 DIAGNOSIS — R1030 Lower abdominal pain, unspecified: Secondary | ICD-10-CM | POA: Diagnosis not present

## 2016-05-24 DIAGNOSIS — Z4682 Encounter for fitting and adjustment of non-vascular catheter: Secondary | ICD-10-CM | POA: Diagnosis not present

## 2016-05-24 DIAGNOSIS — K219 Gastro-esophageal reflux disease without esophagitis: Secondary | ICD-10-CM | POA: Diagnosis not present

## 2016-05-24 DIAGNOSIS — K567 Ileus, unspecified: Secondary | ICD-10-CM

## 2016-05-24 DIAGNOSIS — K8689 Other specified diseases of pancreas: Secondary | ICD-10-CM | POA: Diagnosis present

## 2016-05-24 DIAGNOSIS — R109 Unspecified abdominal pain: Secondary | ICD-10-CM | POA: Diagnosis present

## 2016-05-24 DIAGNOSIS — K5669 Other partial intestinal obstruction: Secondary | ICD-10-CM | POA: Diagnosis not present

## 2016-05-24 DIAGNOSIS — I10 Essential (primary) hypertension: Secondary | ICD-10-CM | POA: Diagnosis not present

## 2016-05-24 DIAGNOSIS — K5080 Crohn's disease of both small and large intestine without complications: Secondary | ICD-10-CM | POA: Diagnosis not present

## 2016-05-24 DIAGNOSIS — E860 Dehydration: Secondary | ICD-10-CM | POA: Diagnosis present

## 2016-05-24 DIAGNOSIS — Z4659 Encounter for fitting and adjustment of other gastrointestinal appliance and device: Secondary | ICD-10-CM

## 2016-05-24 DIAGNOSIS — E870 Hyperosmolality and hypernatremia: Secondary | ICD-10-CM | POA: Diagnosis not present

## 2016-05-24 DIAGNOSIS — K3184 Gastroparesis: Secondary | ICD-10-CM

## 2016-05-24 DIAGNOSIS — Z79899 Other long term (current) drug therapy: Secondary | ICD-10-CM

## 2016-05-24 DIAGNOSIS — E785 Hyperlipidemia, unspecified: Secondary | ICD-10-CM | POA: Diagnosis present

## 2016-05-24 DIAGNOSIS — F419 Anxiety disorder, unspecified: Secondary | ICD-10-CM | POA: Diagnosis present

## 2016-05-24 DIAGNOSIS — R935 Abnormal findings on diagnostic imaging of other abdominal regions, including retroperitoneum: Secondary | ICD-10-CM | POA: Diagnosis not present

## 2016-05-24 DIAGNOSIS — Z87891 Personal history of nicotine dependence: Secondary | ICD-10-CM

## 2016-05-24 DIAGNOSIS — Z09 Encounter for follow-up examination after completed treatment for conditions other than malignant neoplasm: Secondary | ICD-10-CM

## 2016-05-24 DIAGNOSIS — K913 Postprocedural intestinal obstruction, unspecified as to partial versus complete: Secondary | ICD-10-CM

## 2016-05-24 DIAGNOSIS — K56699 Other intestinal obstruction unspecified as to partial versus complete obstruction: Secondary | ICD-10-CM | POA: Diagnosis not present

## 2016-05-24 LAB — URINALYSIS, ROUTINE W REFLEX MICROSCOPIC
Glucose, UA: NEGATIVE mg/dL
HGB URINE DIPSTICK: NEGATIVE
KETONES UR: 20 mg/dL — AB
Nitrite: NEGATIVE
PROTEIN: 30 mg/dL — AB
Specific Gravity, Urine: 1.028 (ref 1.005–1.030)
pH: 5 (ref 5.0–8.0)

## 2016-05-24 LAB — CBC
HEMATOCRIT: 43.9 % (ref 36.0–46.0)
Hemoglobin: 15 g/dL (ref 12.0–15.0)
MCH: 29.8 pg (ref 26.0–34.0)
MCHC: 34.2 g/dL (ref 30.0–36.0)
MCV: 87.1 fL (ref 78.0–100.0)
Platelets: 312 10*3/uL (ref 150–400)
RBC: 5.04 MIL/uL (ref 3.87–5.11)
RDW: 14.4 % (ref 11.5–15.5)
WBC: 11.8 10*3/uL — ABNORMAL HIGH (ref 4.0–10.5)

## 2016-05-24 LAB — COMPREHENSIVE METABOLIC PANEL
ALBUMIN: 4 g/dL (ref 3.5–5.0)
ALT: 17 U/L (ref 14–54)
AST: 19 U/L (ref 15–41)
Alkaline Phosphatase: 113 U/L (ref 38–126)
Anion gap: 12 (ref 5–15)
BUN: 16 mg/dL (ref 6–20)
CHLORIDE: 103 mmol/L (ref 101–111)
CO2: 21 mmol/L — ABNORMAL LOW (ref 22–32)
Calcium: 9.2 mg/dL (ref 8.9–10.3)
Creatinine, Ser: 0.77 mg/dL (ref 0.44–1.00)
GFR calc Af Amer: >60 mL/min (ref >60)
GFR calc non Af Amer: >60 mL/min (ref >60)
GLUCOSE: 113 mg/dL — AB (ref 65–99)
POTASSIUM: 3 mmol/L — AB (ref 3.5–5.1)
Sodium: 136 mmol/L (ref 135–145)
Total Bilirubin: 0.9 mg/dL (ref 0.3–1.2)
Total Protein: 8.7 g/dL — ABNORMAL HIGH (ref 6.5–8.1)

## 2016-05-24 LAB — LIPASE, BLOOD: LIPASE: 20 U/L (ref 11–51)

## 2016-05-24 MED ORDER — LIDOCAINE HCL 2 % EX GEL: 1.0000 "application " | Freq: Once | CUTANEOUS | Status: DC | PRN

## 2016-05-24 MED ORDER — TOPIRAMATE 25 MG PO TABS: 50.0000 mg | ORAL_TABLET | Freq: Every day | ORAL | Status: DC

## 2016-05-24 MED ORDER — ALBUTEROL SULFATE (2.5 MG/3ML) 0.083% IN NEBU: 3.0000 mL | INHALATION_SOLUTION | RESPIRATORY_TRACT | Status: DC | PRN

## 2016-05-24 MED ORDER — SODIUM CHLORIDE 0.9 % IV BOLUS (SEPSIS)
1000.0000 mL | Freq: Once | INTRAVENOUS | Status: AC
Start: 2016-05-24 — End: 2016-05-24
  Administered 2016-05-24: 1000 mL via INTRAVENOUS

## 2016-05-24 MED ORDER — PROMETHAZINE HCL 25 MG/ML IJ SOLN
25.0000 mg | Freq: Four times a day (QID) | INTRAMUSCULAR | Status: DC | PRN
  Administered 2016-05-24 – 2016-05-28 (×13): 25 mg via INTRAVENOUS
  Filled 2016-05-24 (×13): qty 1

## 2016-05-24 MED ORDER — ACETAMINOPHEN 650 MG RE SUPP: 650.0000 mg | Freq: Four times a day (QID) | RECTAL | Status: DC | PRN

## 2016-05-24 MED ORDER — PANTOPRAZOLE SODIUM 40 MG PO TBEC: 40.0000 mg | DELAYED_RELEASE_TABLET | Freq: Every day | ORAL | Status: DC

## 2016-05-24 MED ORDER — SODIUM CHLORIDE 0.9 % IV SOLN: 30.0000 meq | Freq: Once | INTRAVENOUS | Status: DC

## 2016-05-24 MED ORDER — IOPAMIDOL (ISOVUE-300) INJECTION 61%
100.0000 mL | Freq: Once | INTRAVENOUS | Status: AC | PRN
  Administered 2016-05-24: 100 mL via INTRAVENOUS

## 2016-05-24 MED ORDER — PROMETHAZINE HCL 25 MG/ML IJ SOLN
12.5000 mg | Freq: Once | INTRAMUSCULAR | Status: AC
  Administered 2016-05-24: 12.5 mg via INTRAVENOUS
  Filled 2016-05-24: qty 1

## 2016-05-24 MED ORDER — ONDANSETRON HCL 4 MG PO TABS
4.0000 mg | ORAL_TABLET | Freq: Four times a day (QID) | ORAL | Status: DC | PRN
Start: 2016-05-24 — End: 2016-05-25

## 2016-05-24 MED ORDER — MORPHINE SULFATE (PF) 4 MG/ML IV SOLN
2.0000 mg | INTRAVENOUS | Status: DC | PRN
  Administered 2016-05-24 – 2016-05-29 (×23): 2 mg via INTRAVENOUS
  Filled 2016-05-24 (×23): qty 1

## 2016-05-24 MED ORDER — ONDANSETRON HCL 4 MG/2ML IJ SOLN: 4.0000 mg | Freq: Four times a day (QID) | INTRAMUSCULAR | Status: DC | PRN

## 2016-05-24 MED ORDER — ACETAMINOPHEN 325 MG PO TABS: 650.0000 mg | ORAL_TABLET | Freq: Four times a day (QID) | ORAL | Status: DC | PRN

## 2016-05-24 MED ORDER — DICYCLOMINE HCL 10 MG PO CAPS
20.0000 mg | ORAL_CAPSULE | Freq: Once | ORAL | Status: AC
  Administered 2016-05-24: 20 mg via ORAL
  Filled 2016-05-24: qty 2

## 2016-05-24 MED ORDER — ONDANSETRON 4 MG PO TBDP
4.0000 mg | ORAL_TABLET | Freq: Once | ORAL | Status: AC | PRN
  Administered 2016-05-24: 4 mg via ORAL
  Filled 2016-05-24: qty 1

## 2016-05-24 MED ORDER — TIZANIDINE HCL 4 MG PO TABS: 4.0000 mg | ORAL_TABLET | Freq: Three times a day (TID) | ORAL | Status: DC

## 2016-05-24 MED ORDER — OXYCODONE HCL 5 MG PO TABS: 10.0000 mg | ORAL_TABLET | Freq: Every day | ORAL | Status: DC | PRN

## 2016-05-24 MED ORDER — SODIUM CHLORIDE 0.9 % IV BOLUS (SEPSIS)
1000.0000 mL | Freq: Once | INTRAVENOUS | Status: AC
  Administered 2016-05-24: 1000 mL via INTRAVENOUS

## 2016-05-24 MED ORDER — HYDROCORTISONE 2.5 % RE CREA: 1.0000 "application " | TOPICAL_CREAM | Freq: Three times a day (TID) | RECTAL | Status: DC | PRN

## 2016-05-24 MED ORDER — OXYMETAZOLINE HCL 0.05 % NA SOLN
1.0000 | Freq: Once | NASAL | Status: AC
  Administered 2016-05-24: 1 via NASAL
  Filled 2016-05-24: qty 15

## 2016-05-24 MED ORDER — ENOXAPARIN SODIUM 40 MG/0.4ML ~~LOC~~ SOLN
40.0000 mg | Freq: Every day | SUBCUTANEOUS | Status: DC
  Administered 2016-05-25 – 2016-06-01 (×8): 40 mg via SUBCUTANEOUS
  Filled 2016-05-24 (×8): qty 0.4

## 2016-05-24 MED ORDER — AMLODIPINE BESYLATE 10 MG PO TABS: 10.0000 mg | ORAL_TABLET | Freq: Every day | ORAL | Status: DC

## 2016-05-24 MED ORDER — GABAPENTIN 400 MG PO CAPS: 1200.0000 mg | ORAL_CAPSULE | Freq: Every day | ORAL | Status: DC

## 2016-05-24 MED ORDER — GABAPENTIN 300 MG PO CAPS: 600.0000 mg | ORAL_CAPSULE | Freq: Every day | ORAL | Status: DC

## 2016-05-24 MED ORDER — ONDANSETRON HCL 4 MG/2ML IJ SOLN
4.0000 mg | Freq: Once | INTRAMUSCULAR | Status: AC
  Administered 2016-05-24: 4 mg via INTRAVENOUS
  Filled 2016-05-24: qty 2

## 2016-05-24 MED ORDER — LIDOCAINE HCL 2 % EX GEL
1.0000 "application " | Freq: Once | CUTANEOUS | Status: AC | PRN
  Administered 2016-05-24: 1
  Filled 2016-05-24: qty 11

## 2016-05-24 MED ORDER — IOPAMIDOL (ISOVUE-300) INJECTION 61%
INTRAVENOUS | Status: AC
  Filled 2016-05-24: qty 100

## 2016-05-24 MED ORDER — POTASSIUM CHLORIDE 10 MEQ/100ML IV SOLN
10.0000 meq | INTRAVENOUS | Status: AC
  Administered 2016-05-24 (×3): 10 meq via INTRAVENOUS
  Filled 2016-05-24 (×3): qty 100

## 2016-05-24 MED ORDER — METHYLPREDNISOLONE SODIUM SUCC 125 MG IJ SOLR
60.0000 mg | Freq: Two times a day (BID) | INTRAMUSCULAR | Status: DC
  Administered 2016-05-25 – 2016-06-01 (×16): 60 mg via INTRAVENOUS
  Filled 2016-05-24 (×19): qty 2

## 2016-05-24 MED ORDER — GABAPENTIN 400 MG PO CAPS: 1800.0000 mg | ORAL_CAPSULE | Freq: Every day | ORAL | Status: DC

## 2016-05-24 MED ORDER — MOMETASONE FURO-FORMOTEROL FUM 100-5 MCG/ACT IN AERO
2.0000 | INHALATION_SPRAY | Freq: Two times a day (BID) | RESPIRATORY_TRACT | Status: DC
  Administered 2016-05-25 – 2016-06-02 (×12): 2 via RESPIRATORY_TRACT
  Filled 2016-05-24: qty 8.8

## 2016-05-24 NOTE — ED Provider Notes (Signed)
Medical screening examination/treatment/procedure(s) were conducted as a shared visit with non-physician practitioner(s) and myself.  I personally evaluated the patient during the encounter.   EKG Interpretation None     Patient here with nausea and vomiting with history of Crohn's disease. Abdominal CT consistent with possible early obstruction. Will be admitted to the medicine service   Lacretia Leigh, MD 05/24/16 [place]

## 2016-05-24 NOTE — ED Notes (Signed)
Patient transported to CT 

## 2016-05-24 NOTE — ED Notes (Signed)
ED Provider at bedside. 

## 2016-05-24 NOTE — ED Provider Notes (Signed)
[facility] DEPT Provider Note   CSN: [id number] Arrival date & time: 05/24/16  1050     History   Chief Complaint Chief Complaint  Patient presents with  . Emesis  . Abdominal Pain  . Fatigue    HPI [name] is a 56 y.o. female.  HPI   [name] is a 56 y.o. female, with a history of Crohn's, anemia of chronic disease, GERD, HTN, and gastritis, presenting to the ED with nausea and vomiting daily for the last week.  Also complains of generalized abdominal cramping that began after the vomiting. Pain is currently 7/10, cramping, nonradiating, improves some after vomiting. States her pain similar to previous Crohn's flares. Pt denies diarrhea, but states she hasn't had much food over the past week either. No ABX use in last three months. No changes in medications.  Denies fever, hematemesis, hematochezia/melena, urinary complaints, or any other complaints.   GI: [facility]. She contacted her GI office and they said they couldn't see her until Friday, March 23.   Past Medical History:  Diagnosis Date  . Arthritis   . Chronic diarrhea   . Chronic disease anemia   . Crohn's colitis ([place])   . Crohn's disease ([place])   . Depression   . Family history of malignant neoplasm of gastrointestinal tract   . GERD (gastroesophageal reflux disease)   . H/O hiatal hernia   . History of adenomatous polyp of colon   . History of gastritis    AND HX ILEITIS  . History of kidney stones   . History of small bowel obstruction    SECONDARY CROHN'S STRICTURE  S/P COLECTOMY  . Hyperlipidemia   . Hypertension   . Hypopotassemia   . Internal hemorrhoids   . Kidney stones   . PONV (postoperative nausea and vomiting)   . Psoriasis    SKIN  . Right ureteral stone   . Rotavirus infection 05/31/2013  . S/P dilatation of esophageal stricture   . Seasonal asthma   . Urgency of urination   . Vitamin B12 deficiency     Patient Active Problem List   Diagnosis Date Noted  .  N&V (nausea and vomiting) 05/24/2016  . Pancreatic insufficiency   . Viral gastroenteritis 02/20/2016  . Hypokalemia 02/20/2016  . Hypomagnesemia 02/20/2016  . Colitis   . Low TSH level 09/26/2013  . Rotavirus infection 05/31/2013  . Protein-calorie malnutrition, severe ([place]) 05/30/2013  . Crohn's disease involving terminal ileum ([place]) 05/28/2013  . Skin lesion 12/14/2012  . Nausea 10/12/2011  . Weight increase 08/31/2010  . Wears dentures 08/31/2010  . Nausea & vomiting  08/31/2010  . IBS (irritable bowel syndrome) 08/31/2010  . Hiatal hernia 08/31/2010  . Reflux 08/31/2010  . Abdominal pain 08/31/2010  . Rectal bleeding 08/31/2010  . Hemorrhoids 08/31/2010  . Sinus problem/runny nose 08/31/2010  . Wears glasses 08/31/2010  . Asthma 08/30/2010  . COUGH 04/02/2010  . Crohn's disease ([place]) 02/03/2009  . DIARRHEA 02/03/2009  . VITAMIN B12 DEFICIENCY 12/17/2008  . SBO 09/12/2008  . PSORIASIS 11/07/2007  . DEPRESSION 08/31/2007  . HEARING LOSS 08/31/2007  . GERD 08/31/2007  . HIATAL HERNIA 08/31/2007  . COLONIC POLYPS, ADENOMATOUS, HX OF 08/31/2007    Past Surgical History:  Procedure Laterality Date  . ANAL FISSURE REPAIR  1990's  . CARPAL TUNNEL RELEASE Right 2000  . CHOLECYSTECTOMY  2002  . CYSTOSCOPY WITH RETROGRADE PYELOGRAM, URETEROSCOPY AND STENT PLACEMENT Left 03/08/2013   Procedure: CYSTOSCOPY WITH RETROGRADE PYELOGRAM, URETEROSCOPY AND  STENT PLACEMENT stone basketing;  Surgeon: Alexis Frock, MD;  Location: [facility];  Service: Urology;  Laterality: Left;  . CYSTOSCOPY WITH RETROGRADE PYELOGRAM, URETEROSCOPY AND STENT PLACEMENT Right 04/03/2013   Procedure: CYSTOSCOPY WITH RETROGRADE PYELOGRAM, URETEROSCOPY AND STENT PLACEMENT;  Surgeon: Alexis Frock, MD;  Location: [facility];  Service: Urology;  Laterality: Right;  . DX LAPAROSCOPY CONVERTED TO OPEN RIGHT COLECCTOMY  09-15-2010   ANASTOMOTIC STRICTURE  . LAPAROSCOPIC ILEOCECECTOMY  10-09-2008    AND APPENDECTOMY (TERMINAL ILEITIS & PARTIAL SBO)  . VAGINAL HYSTERECTOMY  1992    OB History    No data available       Home Medications    Prior to Admission medications   Medication Sig Start Date End Date Taking? Authorizing Provider  albuterol (PROAIR HFA) 108 (90 BASE) MCG/ACT inhaler Inhale 2 puffs into the lungs every 4 (four) hours as needed for wheezing or shortness of breath.    Yes Historical Provider, MD  amLODipine (NORVASC) 10 MG tablet Take 10 mg by mouth daily.   Yes Historical Provider, MD  budesonide-formoterol (SYMBICORT) 80-4.5 MCG/ACT inhaler Inhale 2 puffs into the lungs daily as needed (SOB, wheezing).    Yes Historical Provider, MD  cyanocobalamin (,VITAMIN B-12,) 1000 MCG/ML injection Inject 1 mL (1,000 mcg total) into the skin every 30 (thirty) days. 01/02/15  Yes Mauri Pole, MD  gabapentin (NEURONTIN) 600 MG tablet Take 600-1,800 mg by mouth 3 (three) times daily. Takes 600 mg at lunch, 1200 mg at dinner, and 1800 mg at bedtime   Yes Historical Provider, MD  hydrocortisone (PROCTOCORT) 1 % CREA Use rectally as needed Patient taking differently: Apply 1 application topically 3 (three) times daily as needed (for rectall pain).  09/23/15  Yes Mauri Pole, MD  lipase/protease/amylase (CREON) 36000 UNITS CPEP capsule Take 2 caps with each meal Patient taking differently: Take 72,000 Units by mouth 3 (three) times daily before meals. Take 2 caps with each meal 05/11/16  Yes Willia Craze, NP  Needles & Syringes MISC 1 Syringe by Does not apply route every 30 (thirty) days. 01/02/15  Yes Mauri Pole, MD  omeprazole (PRILOSEC) 40 MG capsule Take 1 capsule (40 mg total) by mouth daily. 09/14/15  Yes Mauri Pole, MD  ondansetron (ZOFRAN) 4 MG tablet Take 1 tablet (4 mg total) by mouth 2 (two) times daily. 04/15/16  Yes Willia Craze, NP  Oxycodone HCl 10 MG TABS Take 10 mg by mouth 5 (five) times daily as needed (for pain).   Yes Historical  Provider, MD  promethazine (PHENERGAN) 25 MG suppository Place 1 suppository (25 mg total) rectally every 6 (six) hours as needed for nausea or vomiting. 09/14/15  Yes Kavitha Nandigam V, MD  promethazine (PHENERGAN) 25 MG tablet TAKE 1 TABLET BY MOUTH EVERY 6 HOURS AS NEEDED FOR NAUSEA AND VOMITING 05/18/16  Yes Kavitha Nandigam V, MD  tiZANidine (ZANAFLEX) 4 MG tablet Take 4 mg by mouth 3 (three) times daily.   Yes Historical Provider, MD  topiramate (TOPAMAX) 50 MG tablet Take 1 tablet (50 mg total) by mouth at bedtime. 03/21/16  Yes Adam Telford Nab, DO  Vedolizumab (ENTYVIO IV) Inject into the vein every 8 (eight) weeks.   Yes Historical Provider, MD  zolmitriptan (ZOMIG) 5 MG nasal solution 1 spray in one nostril.  May repeat 1 spray once after 2 hours if needed. Not to exceed 2 sprays in 24 hours Patient taking differently: Place 1 spray into the nose  as needed for migraine. 1 spray in one nostril.  May repeat 1 spray once after 2 hours if needed. Not to exceed 2 sprays in 24 hours 04/11/16  Yes Pieter Partridge, DO    Family History Family History  Problem Relation Age of Onset  . Colon cancer Mother   . Colon polyps Mother   . Colon polyps Father   . Lung cancer Father   . Breast cancer Paternal Grandmother   . Colon polyps Brother   . Thyroid disease Neg Hx     Social History Social History  Substance Use Topics  . Smoking status: Former Smoker    Packs/day: 1.00    Years: 10.00    Types: Cigarettes    Quit date: 03/07/1984  . Smokeless tobacco: Never Used  . Alcohol use No     Allergies   Codeine and Humira [adalimumab]   Review of Systems Review of Systems  Constitutional: Negative for chills, diaphoresis and fever.  Respiratory: Negative for shortness of breath.   Cardiovascular: Negative for chest pain.  Gastrointestinal: Positive for abdominal pain, nausea and vomiting. Negative for blood in stool and diarrhea.  Skin: Negative for pallor and rash.  Neurological: Negative  for dizziness, syncope, weakness, light-headedness and headaches.  All other systems reviewed and are negative.    Physical Exam Updated Vital Signs BP (!) 136/94   Pulse (!) 104   Temp 98.7 F (37.1 C) (Oral)   Resp 16   Ht 5' 7"  (1.702 m)   Wt 77.1 kg   SpO2 98%   BMI 26.63 kg/m   Physical Exam  Constitutional: She appears well-developed and well-nourished. No distress.  HENT:  Head: Normocephalic and atraumatic.  Eyes: Conjunctivae are normal.  Neck: Neck supple.  Cardiovascular: Regular rhythm, normal heart sounds and intact distal pulses.  Tachycardia present.   Pulmonary/Chest: Effort normal and breath sounds normal. No respiratory distress.  Abdominal: Soft. There is generalized tenderness. There is no guarding.  Generalized tenderness, but very tender in LLQ.  Musculoskeletal: She exhibits no edema.  Lymphadenopathy:    She has no cervical adenopathy.  Neurological: She is alert.  Skin: Skin is warm and dry. She is not diaphoretic.  Psychiatric: She has a normal mood and affect. Her behavior is normal.  Nursing note and vitals reviewed.    ED Treatments / Results  Labs (all labs ordered are listed, but only abnormal results are displayed) Labs Reviewed  COMPREHENSIVE METABOLIC PANEL - Abnormal; Notable for the following:       Result Value   Potassium 3.0 (*)    CO2 21 (*)    Glucose, Bld 113 (*)    Total Protein 8.7 (*)    All other components within normal limits  CBC - Abnormal; Notable for the following:    WBC 11.8 (*)    All other components within normal limits  URINALYSIS, ROUTINE W REFLEX MICROSCOPIC - Abnormal; Notable for the following:    Color, Urine AMBER (*)    APPearance HAZY (*)    Bilirubin Urine SMALL (*)    Ketones, ur 20 (*)    Protein, ur 30 (*)    Leukocytes, UA TRACE (*)    Bacteria, UA FEW (*)    Squamous Epithelial / LPF 6-30 (*)    All other components within normal limits  LIPASE, BLOOD    EKG  EKG  Interpretation None       Radiology Ct Abdomen Pelvis W Contrast  Result Date:  05/24/2016 CLINICAL DATA:  Intermittent vomiting with generalized abdominal pain EXAM: CT ABDOMEN AND PELVIS WITH CONTRAST TECHNIQUE: Multidetector CT imaging of the abdomen and pelvis was performed using the standard protocol following bolus administration of intravenous contrast. CONTRAST:  180m ISOVUE-300 IOPAMIDOL (ISOVUE-300) INJECTION 61% COMPARISON:  02/20/2016 FINDINGS: Lower chest: Lung bases demonstrate no acute consolidation or pleural effusion. Normal heart size. Hepatobiliary: No focal hepatic abnormality. Intra and extrahepatic biliary dilatation unchanged status post cholecystectomy. Pancreas: Unremarkable. No pancreatic ductal dilatation or surrounding inflammatory changes. Spleen: Normal in size without focal abnormality. Adrenals/Urinary Tract: Adrenal glands within normal limits. Stable subcentimeter hypodense lesion mid left kidney. No hydronephrosis. Bladder normal. Stomach/Bowel: Fluid-filled small bowel loops with mild mucosal enhancement. Decompressed distal small bowel loops with transition from fluid filled bowel to decompressed bowel, series 2, image number 64. No significant bowel wall thickening. Postsurgical changes of the colon with anastomosis in the right abdomen. Diffuse fluid-filled colon without wall thickening. Vascular/Lymphatic: Aortic atherosclerosis. No enlarged abdominal or pelvic lymph nodes. Reproductive: Uterus and bilateral adnexa are unremarkable. Other: No free air. No free fluid. Postsurgical changes of the ventral abdominal wall. Musculoskeletal: No acute or suspicious bone lesions. Degenerative changes. IMPRESSION: 1. Fluid-filled loops of small bowel within the central and lower abdomen with scattered areas of mucosal enhancement could relate to enteritis or mild ileus. Transition from fluid-filled bowel to decompressed small bowel in the right upper pelvis, cannot exclude  partial or developing obstruction. Diffuse fluid-filled colon but without focal wall thickening to suggest an acute colitis. 2. Intra and extrahepatic biliary dilatation status post cholecystectomy. Electronically Signed   By: KDonavan FoilM.D.   On: 05/24/2016 18:24    Procedures Procedures (including critical care time)  Medications Ordered in ED Medications  potassium chloride 10 mEq in 100 mL IVPB (10 mEq Intravenous New Bag/Given 05/24/16 1745)  lidocaine (XYLOCAINE) 2 % jelly 1 application (not administered)  ondansetron (ZOFRAN-ODT) disintegrating tablet 4 mg (4 mg Oral Given 05/24/16 1155)  sodium chloride 0.9 % bolus 1,000 mL (1,000 mLs Intravenous New Bag/Given 05/24/16 1725)    Followed by  sodium chloride 0.9 % bolus 1,000 mL (1,000 mLs Intravenous New Bag/Given 05/24/16 1725)  ondansetron (ZOFRAN) injection 4 mg (4 mg Intravenous Given 05/24/16 1726)  dicyclomine (BENTYL) capsule 20 mg (20 mg Oral Given 05/24/16 1726)  iopamidol (ISOVUE-300) 61 % injection 100 mL (100 mLs Intravenous Contrast Given 05/24/16 1753)  promethazine (PHENERGAN) injection 12.5 mg (12.5 mg Intravenous Given 05/24/16 1838)     Initial Impression / Assessment and Plan / ED Course  I have reviewed the triage vital signs and the nursing notes.  Pertinent labs & imaging results that were available during my care of the patient were reviewed by me and considered in my medical decision making (see chart for details).     Patient presents with abdominal pain, nausea, and vomiting for the past week. Upon reassessment, patient states her pain has come down to a 5/10 with the Bentyl, however, her nausea and vomiting persist. She was offered further pain management, but declined. Patient was able to get some N/V relief following phenergan. Dilated loops of bowel indicating possible obstruction noted on CT. Patient will require NG tube and admission. Plan of care discussed with the patient and her husband at the  bedside. Both parties agree to the plan. 7:03 PM Spoke with Dr. YLorin Mercy hospitalist, who agreed to admit the patient.  Findings and plan of care discussed with ALacretia Leigh MD.    Vitals:  05/24/16 1123 05/24/16 1148 05/24/16 1407 05/24/16 1730  BP: (!) 145/104  (!) 136/94 (!) 153/102  Pulse: (!) 112  (!) 104 97  Resp: 16  16 16   Temp: 98.7 F (37.1 C)     TempSrc: Oral     SpO2: 98%  98% 95%  Weight:  77.1 kg    Height:  5' 7"  (1.702 m)         Final Clinical Impressions(s) / ED Diagnoses   Final diagnoses:  Generalized abdominal pain  Non-intractable vomiting with nausea, unspecified vomiting type    New Prescriptions New Prescriptions   No medications on file     Lorayne Bender, PA-C 05/24/16 1910

## 2016-05-24 NOTE — ED Triage Notes (Signed)
Patient c/o intermittent vomiting and generalized abdominal pain x 1 week. Patient has a history of Crohn's.

## 2016-05-24 NOTE — Telephone Encounter (Signed)
Patient will need to come to ER as she is unable to tolerate PO intake. EGD showed residual food in stomach concerning for gastroparesis.

## 2016-05-24 NOTE — Telephone Encounter (Signed)
Spoke with the spouse. The patient is in bed because she feels so poorly. He reports she has been unable to keep "anything do at all" down for several days. She is vomiting liquids. He further reports she appears to have lost weight and she is weak. [place], but [place] get her medical care in . Okay to send her to the ER?

## 2016-05-24 NOTE — H&P (Signed)
History and Physical    [name] VFI:[id number] DOB: 05-03-1960 DOA: 05/24/2016  PCP: [name], DO Consultants:  [place] - GI Patient coming from: home - lives with husband; NOK: husband, [phone]  Chief Complaint: n/v  HPI: [name] is a 56 y.o. female with medical history significant of Crohn's disease with prior surgeries and obstruction; kidney stones; HTN; HLD; and GERD presenting with n/v for over a week.  Marked abdominal pain.  Uncertain etiology.  +Crohn's, but this is unlike her usual flares.  Last BM was yesterday, was its usual watery consistency.  No fevers.  She was 5 pounds heavier 2 weeks ago.     ED Course: IV Bentyl, NT tube for possible GI obstruction  Review of Systems: As per HPI; otherwise 10 point review of systems reviewed and negative.   Ambulatory Status:  Ambulates with a cane   Past Medical History:  Diagnosis Date  . Arthritis   . Chronic diarrhea   . Chronic disease anemia   . Crohn's disease ([place])   . Depression   . Family history of malignant neoplasm of gastrointestinal tract   . GERD (gastroesophageal reflux disease)   . H/O hiatal hernia   . History of adenomatous polyp of colon   . History of gastritis    AND HX ILEITIS  . History of kidney stones   . History of small bowel obstruction    SECONDARY CROHN'S STRICTURE  S/P COLECTOMY  . Hyperlipidemia   . Hypertension   . Hypopotassemia   . Internal hemorrhoids   . Kidney stones   . PONV (postoperative nausea and vomiting)   . Psoriasis    SKIN  . Right ureteral stone   . Rotavirus infection 05/31/2013  . S/P dilatation of esophageal stricture   . Seasonal asthma   . Urgency of urination   . Vitamin B12 deficiency     Past Surgical History:  Procedure Laterality Date  . ANAL FISSURE REPAIR  1990's  . CARPAL TUNNEL RELEASE Right 2000  . CHOLECYSTECTOMY  2002  . CYSTOSCOPY WITH RETROGRADE PYELOGRAM, URETEROSCOPY AND STENT PLACEMENT Left 03/08/2013   Procedure: CYSTOSCOPY WITH RETROGRADE PYELOGRAM, URETEROSCOPY AND STENT PLACEMENT stone basketing;  Surgeon: Alexis Frock, MD;  Location: [facility];  Service: Urology;  Laterality: Left;  . CYSTOSCOPY WITH RETROGRADE PYELOGRAM, URETEROSCOPY AND STENT PLACEMENT Right 04/03/2013   Procedure: CYSTOSCOPY WITH RETROGRADE PYELOGRAM, URETEROSCOPY AND STENT PLACEMENT;  Surgeon: Alexis Frock, MD;  Location: [facility];  Service: Urology;  Laterality: Right;  . DX LAPAROSCOPY CONVERTED TO OPEN RIGHT COLECCTOMY  09-15-2010   ANASTOMOTIC STRICTURE  . LAPAROSCOPIC ILEOCECECTOMY  10-09-2008   AND APPENDECTOMY (TERMINAL ILEITIS & PARTIAL SBO)  . VAGINAL HYSTERECTOMY  1992    Social History   Social History  . Marital status: Married    Spouse name: N/A  . Number of children: 2  . Years of education: N/A   Occupational History  . unemployed [employer]   Social History Main Topics  . Smoking status: Former Smoker    Packs/day: 1.00    Years: 10.00    Types: Cigarettes    Quit date: 03/07/1984  . Smokeless tobacco: Never Used  . Alcohol use No  . Drug use: No  . Sexual activity: Not on file   Other Topics Concern  . Not on file   Social History Narrative  . No narrative on file    Allergies  Allergen Reactions  . Codeine Hives and Nausea  And Vomiting  . Humira [Adalimumab] Rash    Family History  Problem Relation Age of Onset  . Colon cancer Mother   . Colon polyps Mother   . Colon polyps Father   . Lung cancer Father   . Breast cancer Paternal Grandmother   . Colon polyps Brother   . Thyroid disease Neg Hx     Prior to Admission medications   Medication Sig Start Date End Date Taking? Authorizing Provider  albuterol (PROAIR HFA) 108 (90 BASE) MCG/ACT inhaler Inhale 2 puffs into the lungs every 4 (four) hours as needed for wheezing or shortness of breath.    Yes Historical Provider, MD  amLODipine (NORVASC) 10 MG tablet Take 10 mg by mouth daily.   Yes Historical  Provider, MD  budesonide-formoterol (SYMBICORT) 80-4.5 MCG/ACT inhaler Inhale 2 puffs into the lungs daily as needed (SOB, wheezing).    Yes Historical Provider, MD  cyanocobalamin (,VITAMIN B-12,) 1000 MCG/ML injection Inject 1 mL (1,000 mcg total) into the skin every 30 (thirty) days. 01/02/15  Yes Mauri Pole, MD  gabapentin (NEURONTIN) 600 MG tablet Take 600-1,800 mg by mouth 3 (three) times daily. Takes 600 mg at lunch, 1200 mg at dinner, and 1800 mg at bedtime   Yes Historical Provider, MD  hydrocortisone (PROCTOCORT) 1 % CREA Use rectally as needed Patient taking differently: Apply 1 application topically 3 (three) times daily as needed (for rectall pain).  09/23/15  Yes Mauri Pole, MD  lipase/protease/amylase (CREON) 36000 UNITS CPEP capsule Take 2 caps with each meal Patient taking differently: Take 72,000 Units by mouth 3 (three) times daily before meals. Take 2 caps with each meal 05/11/16  Yes Willia Craze, NP  Needles & Syringes MISC 1 Syringe by Does not apply route every 30 (thirty) days. 01/02/15  Yes Mauri Pole, MD  omeprazole (PRILOSEC) 40 MG capsule Take 1 capsule (40 mg total) by mouth daily. 09/14/15  Yes Mauri Pole, MD  ondansetron (ZOFRAN) 4 MG tablet Take 1 tablet (4 mg total) by mouth 2 (two) times daily. 04/15/16  Yes Willia Craze, NP  Oxycodone HCl 10 MG TABS Take 10 mg by mouth 5 (five) times daily as needed (for pain).   Yes Historical Provider, MD  promethazine (PHENERGAN) 25 MG suppository Place 1 suppository (25 mg total) rectally every 6 (six) hours as needed for nausea or vomiting. 09/14/15  Yes Kavitha Nandigam V, MD  promethazine (PHENERGAN) 25 MG tablet TAKE 1 TABLET BY MOUTH EVERY 6 HOURS AS NEEDED FOR NAUSEA AND VOMITING 05/18/16  Yes Kavitha Nandigam V, MD  tiZANidine (ZANAFLEX) 4 MG tablet Take 4 mg by mouth 3 (three) times daily.   Yes Historical Provider, MD  topiramate (TOPAMAX) 50 MG tablet Take 1 tablet (50 mg total) by mouth  at bedtime. 03/21/16  Yes Adam Telford Nab, DO  Vedolizumab (ENTYVIO IV) Inject into the vein every 8 (eight) weeks.   Yes Historical Provider, MD  zolmitriptan (ZOMIG) 5 MG nasal solution 1 spray in one nostril.  May repeat 1 spray once after 2 hours if needed. Not to exceed 2 sprays in 24 hours Patient taking differently: Place 1 spray into the nose as needed for migraine. 1 spray in one nostril.  May repeat 1 spray once after 2 hours if needed. Not to exceed 2 sprays in 24 hours 04/11/16  Yes Pieter Partridge, DO    Physical Exam: Vitals:   05/24/16 1730 05/24/16 1930 05/24/16 2155 05/24/16 2244  BP: (!) 153/102 137/79 137/72 (!) 149/83  Pulse: 97 97 94 90  Resp: 16 16 12 14   Temp:   99 F (37.2 C) 98.9 F (37.2 C)  TempSrc:    Oral  SpO2: 95% 96% 95% 97%  Weight:    76.6 kg (168 lb 14 oz)  Height:    5' 7"  (1.702 m)     General:  Appears calm and comfortable and is NAD Eyes:  PERRL, EOMI, normal lids, iris ENT:  grossly normal hearing, lips & tongue, mmm Neck:  no LAD, masses or thyromegaly Cardiovascular:  RRR, no m/r/g. No LE edema.  Respiratory:  CTA bilaterally, no w/r/r. Normal respiratory effort. Abdomen:  soft, diffusely tender particularly in lower quadrants and also in LUQ, nd, NABS Skin:  no rash or induration seen on limited exam Musculoskeletal:  grossly normal tone BUE/BLE, good ROM, no bony abnormality Psychiatric:  grossly normal mood and affect, speech fluent and appropriate, AOx3 Neurologic:  CN 2-12 grossly intact, moves all extremities in coordinated fashion, sensation intact  Labs on Admission: I have personally reviewed following labs and imaging studies  CBC:  Recent Labs Lab 05/24/16 1215  WBC 11.8*  HGB 15.0  HCT 43.9  MCV 87.1  PLT 562   Basic Metabolic Panel:  Recent Labs Lab 05/24/16 1215  NA 136  K 3.0*  CL 103  CO2 21*  GLUCOSE 113*  BUN 16  CREATININE 0.77  CALCIUM 9.2   GFR: Estimated Creatinine Clearance: 83.8 mL/min (by C-G  formula based on SCr of 0.77 mg/dL). Liver Function Tests:  Recent Labs Lab 05/24/16 1215  AST 19  ALT 17  ALKPHOS 113  BILITOT 0.9  PROT 8.7*  ALBUMIN 4.0    Recent Labs Lab 05/24/16 1215  LIPASE 20   No results for input(s): AMMONIA in the last 168 hours. Coagulation Profile: No results for input(s): INR, PROTIME in the last 168 hours. Cardiac Enzymes: No results for input(s): CKTOTAL, CKMB, CKMBINDEX, TROPONINI in the last 168 hours. BNP (last 3 results) No results for input(s): PROBNP in the last 8760 hours. HbA1C: No results for input(s): HGBA1C in the last 72 hours. CBG: No results for input(s): GLUCAP in the last 168 hours. Lipid Profile: No results for input(s): CHOL, HDL, LDLCALC, TRIG, CHOLHDL, LDLDIRECT in the last 72 hours. Thyroid Function Tests: No results for input(s): TSH, T4TOTAL, FREET4, T3FREE, THYROIDAB in the last 72 hours. Anemia Panel: No results for input(s): VITAMINB12, FOLATE, FERRITIN, TIBC, IRON, RETICCTPCT in the last 72 hours. Urine analysis:    Component Value Date/Time   COLORURINE AMBER (A) 05/24/2016 1205   APPEARANCEUR HAZY (A) 05/24/2016 1205   LABSPEC 1.028 05/24/2016 1205   PHURINE 5.0 05/24/2016 1205   GLUCOSEU NEGATIVE 05/24/2016 1205   HGBUR NEGATIVE 05/24/2016 1205   BILIRUBINUR SMALL (A) 05/24/2016 1205   KETONESUR 20 (A) 05/24/2016 1205   PROTEINUR 30 (A) 05/24/2016 1205   UROBILINOGEN 0.2 03/29/2013 0919   NITRITE NEGATIVE 05/24/2016 1205   LEUKOCYTESUR TRACE (A) 05/24/2016 1205    Creatinine Clearance: Estimated Creatinine Clearance: 83.8 mL/min (by C-G formula based on SCr of 0.77 mg/dL).  Sepsis Labs: @LABRCNTIP (procalcitonin:4,lacticidven:4) )No results found for this or any previous visit (from the past 240 hour(s)).   Radiological Exams on Admission: Ct Abdomen Pelvis W Contrast  Result Date: 05/24/2016 CLINICAL DATA:  Intermittent vomiting with generalized abdominal pain EXAM: CT ABDOMEN AND PELVIS WITH  CONTRAST TECHNIQUE: Multidetector CT imaging of the abdomen and pelvis was performed using  the standard protocol following bolus administration of intravenous contrast. CONTRAST:  168m ISOVUE-300 IOPAMIDOL (ISOVUE-300) INJECTION 61% COMPARISON:  02/20/2016 FINDINGS: Lower chest: Lung bases demonstrate no acute consolidation or pleural effusion. Normal heart size. Hepatobiliary: No focal hepatic abnormality. Intra and extrahepatic biliary dilatation unchanged status post cholecystectomy. Pancreas: Unremarkable. No pancreatic ductal dilatation or surrounding inflammatory changes. Spleen: Normal in size without focal abnormality. Adrenals/Urinary Tract: Adrenal glands within normal limits. Stable subcentimeter hypodense lesion mid left kidney. No hydronephrosis. Bladder normal. Stomach/Bowel: Fluid-filled small bowel loops with mild mucosal enhancement. Decompressed distal small bowel loops with transition from fluid filled bowel to decompressed bowel, series 2, image number 64. No significant bowel wall thickening. Postsurgical changes of the colon with anastomosis in the right abdomen. Diffuse fluid-filled colon without wall thickening. Vascular/Lymphatic: Aortic atherosclerosis. No enlarged abdominal or pelvic lymph nodes. Reproductive: Uterus and bilateral adnexa are unremarkable. Other: No free air. No free fluid. Postsurgical changes of the ventral abdominal wall. Musculoskeletal: No acute or suspicious bone lesions. Degenerative changes. IMPRESSION: 1. Fluid-filled loops of small bowel within the central and lower abdomen with scattered areas of mucosal enhancement could relate to enteritis or mild ileus. Transition from fluid-filled bowel to decompressed small bowel in the right upper pelvis, cannot exclude partial or developing obstruction. Diffuse fluid-filled colon but without focal wall thickening to suggest an acute colitis. 2. Intra and extrahepatic biliary dilatation status post cholecystectomy.  Electronically Signed   By: KDonavan FoilM.D.   On: 05/24/2016 18:24   Dg Abd Portable 1v  Result Date: 05/24/2016 CLINICAL DATA:  NG tube placement EXAM: PORTABLE ABDOMEN - 1 VIEW COMPARISON:  None. FINDINGS: There is residual contrast within the renal collecting systems and bladder. Esophageal tube tip overlies the mid stomach. Surgical suture in the right abdomen IMPRESSION: Esophageal tube tip and side port overlie a the mid to proximal stomach. Electronically Signed   By: KDonavan FoilM.D.   On: 05/24/2016 21:57    EKG: not done  Assessment/Plan Principal Problem:   N&V (nausea and vomiting) Active Problems:   Crohn's disease (HCC)   Abdominal pain   Hypokalemia   Hyperglycemia   N/V -Patient with h/o Crohn's but also h/o GI surgery -CT indicates possible obstruction, although inflammation/infection are also possible -WBC 11.8, may be stress response -Patient does not report infectious-type illness - no fevers, generalized illness -Will hold antibiotics for now -Given her h/o Crohn's, will start empiric steroids -With concern for possible obstruction or developing obstruction, will place NG tube and leave patient NPO -Depending on her progress, may need GI and/or surgery to see the patient to determine next course of action. -Pain control with morphine -Nausea control with phenergan/zofran -If she develops a fever, would start Cipro/Flagyl while obtaining blood cultures  Hypokalemia -K 3.0 -Repleted in ER.   -Will follow.   -Will check Mag level.  Hyperglycemia -Glucose 113 --May be stress response -Will follow with fasting AM labs     DVT prophylaxis: Lovenox Code Status:  Full - confirmed with patient/family Family Communication: Husband present throughout evaluation Disposition Plan:  Home once clinically improved Consults called: None  Admission status: Admit - It is my clinical opinion that admission to INPATIENT is reasonable and necessary because this  patient will require at least 2 midnights in the hospital to treat this condition based on the medical complexity of the problems presented.  Given the aforementioned information, the predictability of an adverse outcome is felt to be significant.    JKarmen Bongo  MD [facility]  If 7PM-7AM, please contact night-coverage [email or web address] Password [facility]  05/25/2016, 12:31 AM

## 2016-05-24 NOTE — Progress Notes (Signed)
Nursing Note: Called NG placement to Dr.Yates and confirmed that placement is fine.wbb

## 2016-05-25 ENCOUNTER — Inpatient Hospital Stay (HOSPITAL_COMMUNITY): Payer: BLUE CROSS/BLUE SHIELD

## 2016-05-25 DIAGNOSIS — R935 Abnormal findings on diagnostic imaging of other abdominal regions, including retroperitoneum: Secondary | ICD-10-CM

## 2016-05-25 DIAGNOSIS — R739 Hyperglycemia, unspecified: Secondary | ICD-10-CM | POA: Diagnosis present

## 2016-05-25 DIAGNOSIS — Z0189 Encounter for other specified special examinations: Secondary | ICD-10-CM

## 2016-05-25 LAB — BASIC METABOLIC PANEL
Anion gap: 11 (ref 5–15)
BUN: 11 mg/dL (ref 6–20)
CHLORIDE: 108 mmol/L (ref 101–111)
CO2: 21 mmol/L — ABNORMAL LOW (ref 22–32)
CREATININE: 0.65 mg/dL (ref 0.44–1.00)
Calcium: 8.7 mg/dL — ABNORMAL LOW (ref 8.9–10.3)
GFR calc non Af Amer: >60 mL/min (ref >60)
Glucose, Bld: 106 mg/dL — ABNORMAL HIGH (ref 65–99)
POTASSIUM: 3.4 mmol/L — AB (ref 3.5–5.1)
SODIUM: 140 mmol/L (ref 135–145)

## 2016-05-25 LAB — CBC
HCT: 38.8 % (ref 36.0–46.0)
Hemoglobin: 12.8 g/dL (ref 12.0–15.0)
MCH: 29 pg (ref 26.0–34.0)
MCHC: 33 g/dL (ref 30.0–36.0)
MCV: 88 fL (ref 78.0–100.0)
PLATELETS: 218 10*3/uL (ref 150–400)
RBC: 4.41 MIL/uL (ref 3.87–5.11)
RDW: 14.3 % (ref 11.5–15.5)
WBC: 7.1 10*3/uL (ref 4.0–10.5)

## 2016-05-25 LAB — MAGNESIUM: Magnesium: 1.3 mg/dL — ABNORMAL LOW (ref 1.7–2.4)

## 2016-05-25 MED ORDER — PROMETHAZINE HCL 25 MG/ML IJ SOLN
25.0000 mg | Freq: Once | INTRAMUSCULAR | Status: DC
  Filled 2016-05-25: qty 1

## 2016-05-25 MED ORDER — HYDRALAZINE HCL 20 MG/ML IJ SOLN
10.0000 mg | Freq: Four times a day (QID) | INTRAMUSCULAR | Status: DC | PRN
  Administered 2016-05-25 – 2016-05-30 (×4): 10 mg via INTRAVENOUS
  Filled 2016-05-25 (×4): qty 1

## 2016-05-25 MED ORDER — POTASSIUM CHLORIDE 10 MEQ/100ML IV SOLN
10.0000 meq | INTRAVENOUS | Status: AC
  Administered 2016-05-25 (×3): 10 meq via INTRAVENOUS
  Filled 2016-05-25 (×3): qty 100

## 2016-05-25 MED ORDER — DIATRIZOATE MEGLUMINE & SODIUM 66-10 % PO SOLN
90.0000 mL | Freq: Once | ORAL | Status: AC
  Administered 2016-05-25: 90 mL via NASOGASTRIC
  Filled 2016-05-25 (×2): qty 90

## 2016-05-25 MED ORDER — CHLORHEXIDINE GLUCONATE 0.12 % MT SOLN
15.0000 mL | Freq: Two times a day (BID) | OROMUCOSAL | Status: DC
  Administered 2016-05-25 – 2016-06-02 (×15): 15 mL via OROMUCOSAL
  Filled 2016-05-25 (×13): qty 15

## 2016-05-25 MED ORDER — ORAL CARE MOUTH RINSE
15.0000 mL | Freq: Two times a day (BID) | OROMUCOSAL | Status: DC
  Administered 2016-05-25 – 2016-06-01 (×8): 15 mL via OROMUCOSAL

## 2016-05-25 MED ORDER — PROMETHAZINE HCL 25 MG/ML IJ SOLN
25.0000 mg | Freq: Once | INTRAMUSCULAR | Status: AC
  Administered 2016-05-25: 25 mg via INTRAVENOUS

## 2016-05-25 MED ORDER — POTASSIUM CHLORIDE 2 MEQ/ML IV SOLN: 30.0000 meq | Freq: Once | INTRAVENOUS | Status: DC

## 2016-05-25 MED ORDER — PANTOPRAZOLE SODIUM 40 MG IV SOLR
40.0000 mg | Freq: Every day | INTRAVENOUS | Status: DC
  Administered 2016-05-25 – 2016-06-02 (×9): 40 mg via INTRAVENOUS
  Filled 2016-05-25 (×9): qty 40

## 2016-05-25 NOTE — Progress Notes (Signed)
Patient had one loose bowel movement with moderate amount.

## 2016-05-25 NOTE — Progress Notes (Addendum)
PROGRESS NOTE    [name]  MAU:[id number] DOB: April 17, 1960 DOA: 05/24/2016 PCP: [name], DO   Chief Complaint  Patient presents with  . Emesis  . Abdominal Pain  . Fatigue    Brief Narrative:  HPI on 05/24/2016 by Dr. Karmen Bongo [name] is a 56 y.o. female with medical history significant of Crohn's disease with prior surgeries and obstruction; kidney stones; HTN; HLD; and GERD presenting with n/v for over a week.  Marked abdominal pain.  Uncertain etiology.  +Crohn's, but this is unlike her usual flares.  Last BM was yesterday, was its usual watery consistency.  No fevers.  She was 5 pounds heavier 2 weeks ago.  Assessment & Plan   Nausea/vomiting/Abdominal pain/ Possible Small Bowel Obstruction -CT abd/pelvis: Fluid-filled loops of small bowel within the central and lower abdomen with scattered areas of mucosal enhancement could relate to enteritis or mild ileus. Cannot exclude partial or developing obstruction -Patient has had multiple abdominal surgeries -Currently has NG tube in place, and feels nausea has improved -Recently seen by Dr. Silverio Decamp, GI- had EGD on 05/03/16, which showed residual food in the stomach- ?gastroparesis -General surgery and Gastroenterology consulted and appreciated -Continue conservative care, IVF, pain management, antiemetics, NPO  Hypokalemia -Likely secondary to GI losses -Continue to replace and monitor BMP  Hyperglycemia -?stress response and on steroids -Continue to monitor -No history of diabetes  Essential hypertension -Amlodipine discontinued -place on IV hydralazine PRN  Crohn's disease -Currently on no outpatient medications -Sees Dr. Silverio Decamp -GI consulted and appreciated -Continue IV steroids  DVT Prophylaxis  Lovenox  Code Status: Full  Family Communication: None at bedside  Disposition Plan: Admitted, pending surgical and GI recommendations.   Consultants Gastroenterology General  surgery  Procedures  None  Antibiotics   Anti-infectives    None      Subjective:   [name] seen and examined today.  Feels nausea has improved since having the NG tube placed. Denies current abdominal pain, chest pain, shortness of breath.   Objective:   Vitals:   05/24/16 2244 05/25/16 0430 05/25/16 1005 05/25/16 1041  BP: (!) 149/83 136/72 (!) 141/78   Pulse: 90 81    Resp: 14 12    Temp: 98.9 F (37.2 C) 98.8 F (37.1 C)    TempSrc: Oral Oral    SpO2: 97% 96%  98%  Weight: 76.6 kg (168 lb 14 oz)     Height: 5' 7"  (1.702 m)       Intake/Output Summary (Last 24 hours) at 05/25/16 1142 Last data filed at 05/25/16 0700  Gross per 24 hour  Intake             2198 ml  Output              625 ml  Net             1573 ml   Filed Weights   05/24/16 1148 05/24/16 2244  Weight: 77.1 kg (170 lb) 76.6 kg (168 lb 14 oz)    Exam  General: Well developed, well nourished, NAD, appears stated age  HEENT: NCAT, mucous membranes moist. NG tube in place  Cardiovascular: S1 S2 auscultated, no rubs, murmurs or gallops. Regular rate and rhythm.  Respiratory: Clear to auscultation bilaterally with equal chest rise  Abdomen: Soft, mild TTP, nondistended, + bowel sounds  Extremities: warm dry without cyanosis clubbing or edema  Neuro: AAOx3, nonfocal  Skin: Without rashes exudates or nodules  Psych: Normal  affect and demeanor with intact judgement and insight   Data Reviewed: I have personally reviewed following labs and imaging studies  CBC:  Recent Labs Lab 05/24/16 1215 05/25/16 0419  WBC 11.8* 7.1  HGB 15.0 12.8  HCT 43.9 38.8  MCV 87.1 88.0  PLT 312 034   Basic Metabolic Panel:  Recent Labs Lab 05/24/16 1215 05/25/16 0043 05/25/16 0419  NA 136  --  140  K 3.0*  --  3.4*  CL 103  --  108  CO2 21*  --  21*  GLUCOSE 113*  --  106*  BUN 16  --  11  CREATININE 0.77  --  0.65  CALCIUM 9.2  --  8.7*  MG  --  1.3*  --    GFR: Estimated  Creatinine Clearance: 83.8 mL/min (by C-G formula based on SCr of 0.65 mg/dL). Liver Function Tests:  Recent Labs Lab 05/24/16 1215  AST 19  ALT 17  ALKPHOS 113  BILITOT 0.9  PROT 8.7*  ALBUMIN 4.0    Recent Labs Lab 05/24/16 1215  LIPASE 20   No results for input(s): AMMONIA in the last 168 hours. Coagulation Profile: No results for input(s): INR, PROTIME in the last 168 hours. Cardiac Enzymes: No results for input(s): CKTOTAL, CKMB, CKMBINDEX, TROPONINI in the last 168 hours. BNP (last 3 results) No results for input(s): PROBNP in the last 8760 hours. HbA1C: No results for input(s): HGBA1C in the last 72 hours. CBG: No results for input(s): GLUCAP in the last 168 hours. Lipid Profile: No results for input(s): CHOL, HDL, LDLCALC, TRIG, CHOLHDL, LDLDIRECT in the last 72 hours. Thyroid Function Tests: No results for input(s): TSH, T4TOTAL, FREET4, T3FREE, THYROIDAB in the last 72 hours. Anemia Panel: No results for input(s): VITAMINB12, FOLATE, FERRITIN, TIBC, IRON, RETICCTPCT in the last 72 hours. Urine analysis:    Component Value Date/Time   COLORURINE AMBER (A) 05/24/2016 1205   APPEARANCEUR HAZY (A) 05/24/2016 1205   LABSPEC 1.028 05/24/2016 1205   PHURINE 5.0 05/24/2016 1205   GLUCOSEU NEGATIVE 05/24/2016 1205   HGBUR NEGATIVE 05/24/2016 1205   BILIRUBINUR SMALL (A) 05/24/2016 1205   KETONESUR 20 (A) 05/24/2016 1205   PROTEINUR 30 (A) 05/24/2016 1205   UROBILINOGEN 0.2 03/29/2013 0919   NITRITE NEGATIVE 05/24/2016 1205   LEUKOCYTESUR TRACE (A) 05/24/2016 1205   Sepsis Labs: @LABRCNTIP (procalcitonin:4,lacticidven:4)  )No results found for this or any previous visit (from the past 240 hour(s)).    Radiology Studies: Ct Abdomen Pelvis W Contrast  Result Date: 05/24/2016 CLINICAL DATA:  Intermittent vomiting with generalized abdominal pain EXAM: CT ABDOMEN AND PELVIS WITH CONTRAST TECHNIQUE: Multidetector CT imaging of the abdomen and pelvis was performed  using the standard protocol following bolus administration of intravenous contrast. CONTRAST:  176m ISOVUE-300 IOPAMIDOL (ISOVUE-300) INJECTION 61% COMPARISON:  02/20/2016 FINDINGS: Lower chest: Lung bases demonstrate no acute consolidation or pleural effusion. Normal heart size. Hepatobiliary: No focal hepatic abnormality. Intra and extrahepatic biliary dilatation unchanged status post cholecystectomy. Pancreas: Unremarkable. No pancreatic ductal dilatation or surrounding inflammatory changes. Spleen: Normal in size without focal abnormality. Adrenals/Urinary Tract: Adrenal glands within normal limits. Stable subcentimeter hypodense lesion mid left kidney. No hydronephrosis. Bladder normal. Stomach/Bowel: Fluid-filled small bowel loops with mild mucosal enhancement. Decompressed distal small bowel loops with transition from fluid filled bowel to decompressed bowel, series 2, image number 64. No significant bowel wall thickening. Postsurgical changes of the colon with anastomosis in the right abdomen. Diffuse fluid-filled colon without wall thickening. Vascular/Lymphatic: Aortic atherosclerosis.  No enlarged abdominal or pelvic lymph nodes. Reproductive: Uterus and bilateral adnexa are unremarkable. Other: No free air. No free fluid. Postsurgical changes of the ventral abdominal wall. Musculoskeletal: No acute or suspicious bone lesions. Degenerative changes. IMPRESSION: 1. Fluid-filled loops of small bowel within the central and lower abdomen with scattered areas of mucosal enhancement could relate to enteritis or mild ileus. Transition from fluid-filled bowel to decompressed small bowel in the right upper pelvis, cannot exclude partial or developing obstruction. Diffuse fluid-filled colon but without focal wall thickening to suggest an acute colitis. 2. Intra and extrahepatic biliary dilatation status post cholecystectomy. Electronically Signed   By: Donavan Foil M.D.   On: 05/24/2016 18:24   Dg Abd Portable  1v  Result Date: 05/24/2016 CLINICAL DATA:  NG tube placement EXAM: PORTABLE ABDOMEN - 1 VIEW COMPARISON:  None. FINDINGS: There is residual contrast within the renal collecting systems and bladder. Esophageal tube tip overlies the mid stomach. Surgical suture in the right abdomen IMPRESSION: Esophageal tube tip and side port overlie a the mid to proximal stomach. Electronically Signed   By: Donavan Foil M.D.   On: 05/24/2016 21:57     Scheduled Meds: . amLODipine  10 mg Oral Daily  . chlorhexidine  15 mL Mouth Rinse BID  . enoxaparin (LOVENOX) injection  40 mg Subcutaneous QHS  . gabapentin  1,200 mg Oral QAC supper  . gabapentin  1,800 mg Oral QHS  . gabapentin  600 mg Oral QAC lunch  . mouth rinse  15 mL Mouth Rinse q12n4p  . methylPREDNISolone (SOLU-MEDROL) injection  60 mg Intravenous Q12H  . mometasone-formoterol  2 puff Inhalation BID  . pantoprazole  40 mg Oral Daily  . promethazine  25 mg Intravenous Once  . tiZANidine  4 mg Oral TID  . topiramate  50 mg Oral QHS   Continuous Infusions:   LOS: 1 day   Time Spent in minutes   30 minutes  Kellie Chisolm D.O. on 05/25/2016 at 11:42 AM  Between 7am to 7pm - Pager - [phone]  After 7pm go to [email or web address] - password [email or web address]  And look for the night coverage person covering for me after hours  [facility] Office  [phone]

## 2016-05-25 NOTE — Progress Notes (Addendum)
Pt resting quietly in bed.Pt c/o nausea and pain.Paged for suction gauge to set up for int.low wall suction,will medicate for pain.Upon initial assessment noted IV site to be very swollen,infiltrated.A: Removed ,placed warm pack ,elevated and attempted to start and IV in r hand but was unsuccessful.paged IV team.wbb

## 2016-05-25 NOTE — Progress Notes (Signed)
Nutrition Follow-up  DOCUMENTATION CODES:   Severe malnutrition in context of acute illness/injury  INTERVENTION:   Once PO established, Monitor magnesium, potassium, and phosphorus daily for at least 3 days, MD to replete as needed, as pt is at risk for refeeding syndrome given severe malnutrition, poor PO intake> 1 week and low Mg and K levels.  Diet advancement per MD If patient unable have diet advanced soon, will need to consider nutrition support given severe malnutrition and weight loss.  RD to continue to monitor for plan  NUTRITION DIAGNOSIS:   Malnutrition related to acute illness as evidenced by percent weight loss, energy intake < or equal to 50% for > or equal to 5 days.  GOAL:   Patient will meet greater than or equal to 90% of their needs  MONITOR:   Labs, Weight trends, Diet advancement, I & O's  REASON FOR ASSESSMENT:   Malnutrition Screening Tool    ASSESSMENT:   56 y.o. female with medical history significant of Crohn's disease with prior surgeries and obstruction; kidney stones; HTN; HLD; and GERD presenting with n/v for over a week.  Marked abdominal pain.  Uncertain etiology.  +Crohn's, but this is unlike her usual flares.  Last BM was yesterday, was its usual watery consistency.  No fevers.  She was 5 pounds heavier 2 weeks ago.   Patient in room with NGT placed to IS. Pt reports feeling nauseous and waiting on nausea meds. Pt states she has been unable to tolerate any food or drink since Saturday, 3/17 (5 days). States she ate a few bites of chicken and mashed potatoes with gravy. However, she has had one and off nausea/vomiting since December 2017.  Pt on bowel rest at this time. If remains on bowel rest for another couple of days, may need to consider nutrition support.   Per chart review, pt has lost 31 lb since 11/24/15 (16% wt loss x 6 months, significant for time frame). Nutrition focused physical exam shows no sign of depletion of muscle mass or  body fat.  Medications: IV Protonix daily, IV KCl every 1 hr x 3, IV Phenergan PRN Labs reviewed: Low K, Mg  Diet Order:  Diet NPO time specified Except for: Sips with Meds  Skin:  Reviewed, no issues  Last BM:  3/20  Height:   Ht Readings from Last 1 Encounters:  05/24/16 5' 7"  (1.702 m)    Weight:   Wt Readings from Last 1 Encounters:  05/24/16 168 lb 14 oz (76.6 kg)    Ideal Body Weight:  61.4 kg  BMI:  Body mass index is 26.45 kg/m.  Estimated Nutritional Needs:   Kcal:  1900-2100  Protein:  85-95g  Fluid:  2L/day  EDUCATION NEEDS:   No education needs identified at this time  Clayton Bibles, MS, RD, LDN Pager: [phone] After Hours Pager: [phone]

## 2016-05-25 NOTE — Progress Notes (Signed)
Had bowel movement in the bedside comode,emptied moderated amount of yellowish loose BM.

## 2016-05-25 NOTE — Progress Notes (Signed)
NGT hooked to low wall intermittent suction.

## 2016-05-25 NOTE — Progress Notes (Signed)
Nursing Note: Morphine given@ 0030.wbb

## 2016-05-25 NOTE — Consult Note (Signed)
Referring Provider: Dr. Martie Lee Primary Care Physician:  [name], DO Primary Gastroenterologist:  Dr. Silverio Decamp  Reason for Consultation:  Crohn's disease, SBO  HPI: [name] is a 56 y.o. female known to Dr. Silverio Decamp. She has a history of small bowel and large bowel Crohn's disease status post terminal ileal resection in 2008 and colon resection in 2012, both performed by Dr. Donne Hazel. She is currently maintained on Entyvio Q 8 weeks and has been on that since about July 2016. She had previously been treated with Remicade, Humira, Imuran, Entocort, and has had prolonged prednisone/steroid exposure in the past.  Patient also has a history of pancreatic insufficiency and takes Creon. She was hospitalized mid December with nausea, vomiting, and diarrhea. CT scan showed chronic changes throughout the colon and the anastomotic segment of the small bowel. There was suggestion of possible acute segmental colitis involving descending colon. Antibiotics were started but steroids were held pending stool studies which returned negative. Her symptoms improved. As it turns out patient had been off Creon because she could not afford it. Symptoms were not felt to represent a Crohn's flare. She improved and was discharged home on Creon 1 tablet with each meal. Patient notes that she used to take 2 Creon with each meal.  She was then seen in our office for follow-up in early February at which time she was still complaining of intermittent nausea and vomiting so was scheduled for EGD.  Those results are below, but it was essentially unremarkable except for retained gastric contents.  GES was scheduled but that has not yet been performed.  She presented to the ED on 3/20 with complaints of one week of persistent nausea and vomiting with abdominal pain.  Inability to take anything PO without vomiting.  CT scan showed the following:  IMPRESSION: 1. Fluid-filled loops of small bowel within the central and  lower abdomen with scattered areas of mucosal enhancement could relate to enteritis or mild ileus. Transition from fluid-filled bowel to decompressed small bowel in the right upper pelvis, cannot exclude partial or developing obstruction. Diffuse fluid-filled colon but without focal wall thickening to suggest an acute colitis. 2. Intra and extrahepatic biliary dilatation status post cholecystectomy.  Surgery is seeing the patient as well.  She had an NGT in place and has had a lot of output from that; 400 cc's just emptied.  Abdominal distention is better just still has abdominal pain in lower abdomen.  She has been started on solumedrol 60 mg every 12 hours empirically for possible Crohn's disease.  She says that this does not feel typical of her Crohn's flares.   EGD 05/03/2016 by Dr. Silverio Decamp showed the following:   - White nummular lesions were noted in the distal esophagus. Biopsies were taken with a cold forceps for histology. - A medium amount of food (residue) was found in the gastric body. - The examined duodenum was normal.  Surgical [P], distal esophagus - SQUAMOUS MUCOSA WITH CHRONIC INFLAMMATION AND PARAKERATOSIS. - NO FUNGAL ORGANISMS IDENTIFIED WITH PAS-F STAIN. - NEGATIVE FOR DYSPLASIA OR MALIGNANCY.  Past Medical History:  Diagnosis Date  . Arthritis   . Chronic diarrhea   . Chronic disease anemia   . Crohn's disease ([place])   . Depression   . Family history of malignant neoplasm of gastrointestinal tract   . GERD (gastroesophageal reflux disease)   . H/O hiatal hernia   . History of adenomatous polyp of colon   . History of gastritis  AND HX ILEITIS  . History of kidney stones   . History of small bowel obstruction    SECONDARY CROHN'S STRICTURE  S/P COLECTOMY  . Hyperlipidemia   . Hypertension   . Hypopotassemia   . Internal hemorrhoids   . Kidney stones   . PONV (postoperative nausea and vomiting)   . Psoriasis    SKIN  . Right ureteral stone   .  Rotavirus infection 05/31/2013  . S/P dilatation of esophageal stricture   . Seasonal asthma   . Urgency of urination   . Vitamin B12 deficiency     Past Surgical History:  Procedure Laterality Date  . ANAL FISSURE REPAIR  1990's  . CARPAL TUNNEL RELEASE Right 2000  . CHOLECYSTECTOMY  2002  . CYSTOSCOPY WITH RETROGRADE PYELOGRAM, URETEROSCOPY AND STENT PLACEMENT Left 03/08/2013   Procedure: CYSTOSCOPY WITH RETROGRADE PYELOGRAM, URETEROSCOPY AND STENT PLACEMENT stone basketing;  Surgeon: Alexis Frock, MD;  Location: [facility];  Service: Urology;  Laterality: Left;  . CYSTOSCOPY WITH RETROGRADE PYELOGRAM, URETEROSCOPY AND STENT PLACEMENT Right 04/03/2013   Procedure: CYSTOSCOPY WITH RETROGRADE PYELOGRAM, URETEROSCOPY AND STENT PLACEMENT;  Surgeon: Alexis Frock, MD;  Location: [facility];  Service: Urology;  Laterality: Right;  . DX LAPAROSCOPY CONVERTED TO OPEN RIGHT COLECCTOMY  09-15-2010   ANASTOMOTIC STRICTURE  . LAPAROSCOPIC ILEOCECECTOMY  10-09-2008   AND APPENDECTOMY (TERMINAL ILEITIS & PARTIAL SBO)  . VAGINAL HYSTERECTOMY  1992    Prior to Admission medications   Medication Sig Start Date End Date Taking? Authorizing Provider  albuterol (PROAIR HFA) 108 (90 BASE) MCG/ACT inhaler Inhale 2 puffs into the lungs every 4 (four) hours as needed for wheezing or shortness of breath.    Yes Historical Provider, MD  amLODipine (NORVASC) 10 MG tablet Take 10 mg by mouth daily.   Yes Historical Provider, MD  budesonide-formoterol (SYMBICORT) 80-4.5 MCG/ACT inhaler Inhale 2 puffs into the lungs daily as needed (SOB, wheezing).    Yes Historical Provider, MD  cyanocobalamin (,VITAMIN B-12,) 1000 MCG/ML injection Inject 1 mL (1,000 mcg total) into the skin every 30 (thirty) days. 01/02/15  Yes Mauri Pole, MD  gabapentin (NEURONTIN) 600 MG tablet Take 600-1,800 mg by mouth 3 (three) times daily. Takes 600 mg at lunch, 1200 mg at dinner, and 1800 mg at bedtime   Yes Historical  Provider, MD  hydrocortisone (PROCTOCORT) 1 % CREA Use rectally as needed Patient taking differently: Apply 1 application topically 3 (three) times daily as needed (for rectall pain).  09/23/15  Yes Mauri Pole, MD  lipase/protease/amylase (CREON) 36000 UNITS CPEP capsule Take 2 caps with each meal Patient taking differently: Take 72,000 Units by mouth 3 (three) times daily before meals. Take 2 caps with each meal 05/11/16  Yes Willia Craze, NP  Needles & Syringes MISC 1 Syringe by Does not apply route every 30 (thirty) days. 01/02/15  Yes Mauri Pole, MD  omeprazole (PRILOSEC) 40 MG capsule Take 1 capsule (40 mg total) by mouth daily. 09/14/15  Yes Mauri Pole, MD  ondansetron (ZOFRAN) 4 MG tablet Take 1 tablet (4 mg total) by mouth 2 (two) times daily. 04/15/16  Yes Willia Craze, NP  Oxycodone HCl 10 MG TABS Take 10 mg by mouth 5 (five) times daily as needed (for pain).   Yes Historical Provider, MD  promethazine (PHENERGAN) 25 MG suppository Place 1 suppository (25 mg total) rectally every 6 (six) hours as needed for nausea or vomiting. 09/14/15  Yes Kavitha Nandigam V,  MD  promethazine (PHENERGAN) 25 MG tablet TAKE 1 TABLET BY MOUTH EVERY 6 HOURS AS NEEDED FOR NAUSEA AND VOMITING 05/18/16  Yes Mauri Pole, MD  tiZANidine (ZANAFLEX) 4 MG tablet Take 4 mg by mouth 3 (three) times daily.   Yes Historical Provider, MD  topiramate (TOPAMAX) 50 MG tablet Take 1 tablet (50 mg total) by mouth at bedtime. 03/21/16  Yes Adam Telford Nab, DO  Vedolizumab (ENTYVIO IV) Inject into the vein every 8 (eight) weeks.   Yes Historical Provider, MD  zolmitriptan (ZOMIG) 5 MG nasal solution 1 spray in one nostril.  May repeat 1 spray once after 2 hours if needed. Not to exceed 2 sprays in 24 hours Patient taking differently: Place 1 spray into the nose as needed for migraine. 1 spray in one nostril.  May repeat 1 spray once after 2 hours if needed. Not to exceed 2 sprays in 24 hours 04/11/16  Yes  Pieter Partridge, DO    Current Facility-Administered Medications  Medication Dose Route Frequency Provider Last Rate Last Dose  . acetaminophen (TYLENOL) tablet 650 mg  650 mg Oral Q6H PRN Karmen Bongo, MD       Or  . acetaminophen (TYLENOL) suppository 650 mg  650 mg Rectal Q6H PRN Karmen Bongo, MD      . albuterol (PROVENTIL) (2.5 MG/3ML) 0.083% nebulizer solution 3 mL  3 mL Inhalation Q4H PRN Karmen Bongo, MD      . amLODipine (NORVASC) tablet 10 mg  10 mg Oral Daily Karmen Bongo, MD      . chlorhexidine (PERIDEX) 0.12 % solution 15 mL  15 mL Mouth Rinse BID Karmen Bongo, MD   15 mL at 05/25/16 1012  . enoxaparin (LOVENOX) injection 40 mg  40 mg Subcutaneous QHS Karmen Bongo, MD   40 mg at 05/25/16 0111  . gabapentin (NEURONTIN) capsule 1,200 mg  1,200 mg Oral QAC supper Karmen Bongo, MD      . gabapentin (NEURONTIN) capsule 1,800 mg  1,800 mg Oral QHS Karmen Bongo, MD      . gabapentin (NEURONTIN) capsule 600 mg  600 mg Oral QAC lunch Karmen Bongo, MD      . hydrocortisone (ANUSOL-HC) 2.5 % rectal cream 1 application  1 application Topical TID PRN Karmen Bongo, MD      . MEDLINE mouth rinse  15 mL Mouth Rinse q12n4p Karmen Bongo, MD      . methylPREDNISolone sodium succinate (SOLU-MEDROL) 125 mg/2 mL injection 60 mg  60 mg Intravenous Q12H Karmen Bongo, MD   60 mg at 05/25/16 1012  . mometasone-formoterol (DULERA) 100-5 MCG/ACT inhaler 2 puff  2 puff Inhalation BID Karmen Bongo, MD   2 puff at 05/25/16 1041  . morphine 4 MG/ML injection 2 mg  2 mg Intravenous Q2H PRN Karmen Bongo, MD   2 mg at 05/25/16 1012  . oxyCODONE (Oxy IR/ROXICODONE) immediate release tablet 10 mg  10 mg Oral 5 X Daily PRN Karmen Bongo, MD      . pantoprazole (PROTONIX) EC tablet 40 mg  40 mg Oral Daily Karmen Bongo, MD      . promethazine (PHENERGAN) injection 25 mg  25 mg Intravenous Q6H PRN Karmen Bongo, MD   25 mg at 05/25/16 0801  . promethazine (PHENERGAN) injection 25 mg  25 mg  Intravenous Once Gardiner Barefoot, NP      . tiZANidine (ZANAFLEX) tablet 4 mg  4 mg Oral TID Karmen Bongo, MD      .  topiramate (TOPAMAX) tablet 50 mg  50 mg Oral QHS Karmen Bongo, MD        Allergies as of 05/24/2016 - Review Complete 05/24/2016  Allergen Reaction Noted  . Codeine Hives and Nausea And Vomiting 08/31/2007  . Humira [adalimumab] Rash 12/24/2013    Family History  Problem Relation Age of Onset  . Colon cancer Mother   . Colon polyps Mother   . Colon polyps Father   . Lung cancer Father   . Breast cancer Paternal Grandmother   . Colon polyps Brother   . Thyroid disease Neg Hx     Social History   Social History  . Marital status: Married    Spouse name: N/A  . Number of children: 2  . Years of education: N/A   Occupational History  . unemployed [employer]   Social History Main Topics  . Smoking status: Former Smoker    Packs/day: 1.00    Years: 10.00    Types: Cigarettes    Quit date: 03/07/1984  . Smokeless tobacco: Never Used  . Alcohol use No  . Drug use: No  . Sexual activity: Not on file   Other Topics Concern  . Not on file   Social History Narrative  . No narrative on file    Review of Systems: ROS is O/W negative except as mentioned in HPI.  Physical Exam: Vital signs in last 24 hours: Temp:  [98.8 F (37.1 C)-99 F (37.2 C)] 98.8 F (37.1 C) (03/21 0430) Pulse Rate:  [81-104] 81 (03/21 0430) Resp:  [12-16] 12 (03/21 0430) BP: (136-153)/(72-102) 141/78 (03/21 1005) SpO2:  [95 %-98 %] 98 % (03/21 1041) Weight:  [168 lb 14 oz (76.6 kg)-170 lb (77.1 kg)] 168 lb 14 oz (76.6 kg) (03/20 2244) Last BM Date: 05/24/16 General:  Alert, Well-developed, well-nourished, pleasant and cooperative in NAD Head:  Normocephalic and atraumatic. Eyes:  Sclera clear, no icterus.  Conjunctiva pink. Ears:  Normal auditory acuity. Nose:  NGT in place; 400 cc just emptied from canister. Mouth:  No deformity or lesions.   Lungs:  Clear  throughout to auscultation.  No wheezes, crackles, or rhonchi.  Heart:  Regular rate and rhythm; no murmurs, clicks, rubs, or gallops. Abdomen:  Soft, non-distended.  BS are present.  Lower abdominal TTP. Rectal:  Deferred  Msk:  Symmetrical without gross deformities. Pulses:  Normal pulses noted. Extremities:  Without clubbing or edema. Neurologic:  Alert and oriented x 4;  grossly normal neurologically. Skin:  Areas of psoriasis noted on right knee. Psych:  Alert and cooperative. Normal mood and affect.  Intake/Output from previous day: 03/20 0701 - 03/21 0700 In: 2198 [IV Piggyback:2198] Out: 625 [Urine:125; Emesis/NG output:500]  Lab Results:  Recent Labs  05/24/16 1215 05/25/16 0419  WBC 11.8* 7.1  HGB 15.0 12.8  HCT 43.9 38.8  PLT 312 218   BMET  Recent Labs  05/24/16 1215 05/25/16 0419  NA 136 140  K 3.0* 3.4*  CL 103 108  CO2 21* 21*  GLUCOSE 113* 106*  BUN 16 11  CREATININE 0.77 0.65  CALCIUM 9.2 8.7*   LFT  Recent Labs  05/24/16 1215  PROT 8.7*  ALBUMIN 4.0  AST 19  ALT 17  ALKPHOS 113  BILITOT 0.9   Studies/Results: Ct Abdomen Pelvis W Contrast  Result Date: 05/24/2016 CLINICAL DATA:  Intermittent vomiting with generalized abdominal pain EXAM: CT ABDOMEN AND PELVIS WITH CONTRAST TECHNIQUE: Multidetector CT imaging of the abdomen and pelvis was performed  using the standard protocol following bolus administration of intravenous contrast. CONTRAST:  125m ISOVUE-300 IOPAMIDOL (ISOVUE-300) INJECTION 61% COMPARISON:  02/20/2016 FINDINGS: Lower chest: Lung bases demonstrate no acute consolidation or pleural effusion. Normal heart size. Hepatobiliary: No focal hepatic abnormality. Intra and extrahepatic biliary dilatation unchanged status post cholecystectomy. Pancreas: Unremarkable. No pancreatic ductal dilatation or surrounding inflammatory changes. Spleen: Normal in size without focal abnormality. Adrenals/Urinary Tract: Adrenal glands within normal  limits. Stable subcentimeter hypodense lesion mid left kidney. No hydronephrosis. Bladder normal. Stomach/Bowel: Fluid-filled small bowel loops with mild mucosal enhancement. Decompressed distal small bowel loops with transition from fluid filled bowel to decompressed bowel, series 2, image number 64. No significant bowel wall thickening. Postsurgical changes of the colon with anastomosis in the right abdomen. Diffuse fluid-filled colon without wall thickening. Vascular/Lymphatic: Aortic atherosclerosis. No enlarged abdominal or pelvic lymph nodes. Reproductive: Uterus and bilateral adnexa are unremarkable. Other: No free air. No free fluid. Postsurgical changes of the ventral abdominal wall. Musculoskeletal: No acute or suspicious bone lesions. Degenerative changes. IMPRESSION: 1. Fluid-filled loops of small bowel within the central and lower abdomen with scattered areas of mucosal enhancement could relate to enteritis or mild ileus. Transition from fluid-filled bowel to decompressed small bowel in the right upper pelvis, cannot exclude partial or developing obstruction. Diffuse fluid-filled colon but without focal wall thickening to suggest an acute colitis. 2. Intra and extrahepatic biliary dilatation status post cholecystectomy. Electronically Signed   By: KDonavan FoilM.D.   On: 05/24/2016 18:24   Dg Abd Portable 1v  Result Date: 05/24/2016 CLINICAL DATA:  NG tube placement EXAM: PORTABLE ABDOMEN - 1 VIEW COMPARISON:  None. FINDINGS: There is residual contrast within the renal collecting systems and bladder. Esophageal tube tip overlies the mid stomach. Surgical suture in the right abdomen IMPRESSION: Esophageal tube tip and side port overlie a the mid to proximal stomach. Electronically Signed   By: KDonavan FoilM.D.   On: 05/24/2016 21:57   IMPRESSION:  114 56year old female with small and large bowel Crohn's disease s/p terminal ileal resection in 2008 and colon resection in 2012. Maintained on  Entyvio Q8 weeks. Here with N/V/abdominal pain and inability to take PO for the past week.  CT scan suggesting at least partial SBO.  ? Mechanical or secondary to Crohn's disease.  Surgery is following as well and are planning a small bowel protocol to evaluate.    2.  Hypokalemia:  Potassium 3.0 on admission and up to 3.4 today.  3. Pancreatic insufficiency. Will resume Creon when able to take PO.  PLAN: -Will follow with surgery.  Follow-up results of small bowel protocol. -Correct electrolytes. -Continue NGT. -Ok to continue IV steroids for now.  ZEHR, JESSICA D.  05/25/2016, 11:32 AM Pager number [phone] GI ATTENDING  History, laboratories, x-rays, prior endoscopy reports, GI office notes reviewed. Patient seen and examined. Agree with comprehensive consultation note as outlined above. Patient has complicated Crohn's disease for which she is undergone several surgeries. Last colonoscopy a few years ago revealed microscopic disease of the large and small bowel. She has been on an Entyvio for some time. She's been having recurrent problems with obstructive symptoms as manifested by recurrent pain with nausea and vomiting. Upper endoscopy revealed retained food. CT shows evidence for at least partial small bowel obstruction. Some inflammation but not overwhelming. Certainly this could be due to Crohn's disease with underlying fixed stricture or possibly benign adhesive disease given her prior surgeries. This is not pure inflammatory Crohn's disease  alone. At This point she has been empirically placed on steroids, which is okay but of questionable benefit. She has been evaluated by general surgery. If she does not demonstrate clinical improvement or as recurrent similar problems, she will likely need laparotomy. We'll follow.  Docia Chuck. Geri Seminole., M.D. [facility] Division of Gastroenterology

## 2016-05-25 NOTE — Consult Note (Signed)
Reason for Consult:Crohn's with SBO Referring Physician: M [name] is an 56 y.o. female.  CC:  Increased nausea/vomiting and abdominal pain with anything PO for a week. Unable to keep anything down, increasing pain.  HPI: Pt with hx of large and small bowel Crohn's disease. She is undergone terminal resections 2010 and colon resection 2012. Her most recent visit with [facility] and Dr. Silverio Decamp was on 04/15/16. She is maintained on Entyvio every 8 weeks. She also has pancreatic insufficiency and is on Creon. She was hospitalized December 2017 with nausea vomiting diarrhea. CT scan at that time showed chronic changes throughout the colon and the anastomotic segment of small bowel. It was found she was off her Creon and symptoms were felt to represent a Crohn's flare. She was doing better on her visit 04/15/16, but still having nausea and vomiting several times per week, even with small meals.  Also complaining of crampy mid abdominal pain. Stools varied in consistency and she had her Creon increased.  She was scheduled for EGD and place on Zofran BID for symptom relief.    Yesterday she called [facility] and was unable to keep anything by mouth down for 1 week, along with generalized abdominal pain. She was sent to the emergency department. Workup in the emergency department shows she is afebrile but somewhat hypertensive. She has a mild tachycardia. Labs show potassium of 3 and a white count of 11.8. Creatinine and be under normal. Hemoglobin and hematocrit are elevated 15/43.9. UA was normal. A CT scan was obtained which shows fluid filled small bowel loops with mild mucosal enhancement. Decompressed distal small bowel with transition from the fluid filled bowel to decompressed bowel. No significant bowel wall thickening. Postsurgical changes of the colon with anastomosis the right abdomen. Diffuse fluid filled, without wall thickening. An NG tube was placed, she was placed on low  intermittent wall suction., Bowel rest and IV hydration. We are asked to see.  I/O shows 500 from NG yesterday and another 400 this AM.she remains afebrile, BP is better.  K+ is 3.4 CBC is good, H/H down with hydration to 12.8/38.8.     Past Medical History:  Diagnosis Date  . Arthritis   . Chronic diarrhea   . Chronic disease anemia   . Crohn's disease ([place])   . Depression   . Family history of malignant neoplasm of gastrointestinal tract   . GERD (gastroesophageal reflux disease)   . H/O hiatal hernia   . History of adenomatous polyp of colon   . History of gastritis    AND HX ILEITIS  . History of kidney stones   . History of small bowel obstruction    SECONDARY CROHN'S STRICTURE  S/P COLECTOMY  . Hyperlipidemia   . Hypertension   . Hypopotassemia   . Internal hemorrhoids   . Kidney stones   . PONV (postoperative nausea and vomiting)   . Psoriasis    SKIN  . Right ureteral stone   . Rotavirus infection 05/31/2013  . S/P dilatation of esophageal stricture   . Seasonal asthma   . Urgency of urination   . Vitamin B12 deficiency     Past Surgical History:  Procedure Laterality Date  . ANAL FISSURE REPAIR  1990's  . CARPAL TUNNEL RELEASE Right 2000  . CHOLECYSTECTOMY  2002  . CYSTOSCOPY WITH RETROGRADE PYELOGRAM, URETEROSCOPY AND STENT PLACEMENT Left 03/08/2013   Procedure: CYSTOSCOPY WITH RETROGRADE PYELOGRAM, URETEROSCOPY AND STENT PLACEMENT stone basketing;  Surgeon:  Alexis Frock, MD;  Location: [facility];  Service: Urology;  Laterality: Left;  . CYSTOSCOPY WITH RETROGRADE PYELOGRAM, URETEROSCOPY AND STENT PLACEMENT Right 04/03/2013   Procedure: CYSTOSCOPY WITH RETROGRADE PYELOGRAM, URETEROSCOPY AND STENT PLACEMENT;  Surgeon: Alexis Frock, MD;  Location: [facility];  Service: Urology;  Laterality: Right;  . DX LAPAROSCOPY CONVERTED TO OPEN RIGHT COLECCTOMY  09-15-2010   ANASTOMOTIC STRICTURE  . LAPAROSCOPIC ILEOCECECTOMY  10-09-2008   AND APPENDECTOMY  (TERMINAL ILEITIS & PARTIAL SBO)  . VAGINAL HYSTERECTOMY  1992    Family History  Problem Relation Age of Onset  . Colon cancer Mother   . Colon polyps Mother   . Colon polyps Father   . Lung cancer Father   . Breast cancer Paternal Grandmother   . Colon polyps Brother   . Thyroid disease Neg Hx     Social History:  reports that she quit smoking about 32 years ago. Her smoking use included Cigarettes. She has a 10.00 pack-year smoking history. She has never used smokeless tobacco. She reports that she does not drink alcohol or use drugs. Tobacco: ETOH:   Drugs:     Allergies:  Allergies  Allergen Reactions  . Codeine Hives and Nausea And Vomiting  . Humira [Adalimumab] Rash    Medications:  Prior to Admission:  Facility-Administered Medications Prior to Admission  Medication Dose Route Frequency Provider Last Rate Last Dose  . [DISCONTINUED] 0.9 %  sodium chloride infusion  500 mL Intravenous Continuous Mauri Pole, MD       Prescriptions Prior to Admission  Medication Sig Dispense Refill Last Dose  . albuterol (PROAIR HFA) 108 (90 BASE) MCG/ACT inhaler Inhale 2 puffs into the lungs every 4 (four) hours as needed for wheezing or shortness of breath.    unknown  . amLODipine (NORVASC) 10 MG tablet Take 10 mg by mouth daily.   05/21/2016  . budesonide-formoterol (SYMBICORT) 80-4.5 MCG/ACT inhaler Inhale 2 puffs into the lungs daily as needed (SOB, wheezing).    unknown  . cyanocobalamin (,VITAMIN B-12,) 1000 MCG/ML injection Inject 1 mL (1,000 mcg total) into the skin every 30 (thirty) days. 1 mL 11 04/2016  . gabapentin (NEURONTIN) 600 MG tablet Take 600-1,800 mg by mouth 3 (three) times daily. Takes 600 mg at lunch, 1200 mg at dinner, and 1800 mg at bedtime   Past Week at Unknown time  . hydrocortisone (PROCTOCORT) 1 % CREA Use rectally as needed (Patient taking differently: Apply 1 application topically 3 (three) times daily as needed (for rectall pain). ) 1 Tube 3  05/23/2016 at Unknown time  . lipase/protease/amylase (CREON) 36000 UNITS CPEP capsule Take 2 caps with each meal (Patient taking differently: Take 72,000 Units by mouth 3 (three) times daily before meals. Take 2 caps with each meal) 180 capsule 3 05/21/2016  . Needles & Syringes MISC 1 Syringe by Does not apply route every 30 (thirty) days. 12 each 0 unknown  . omeprazole (PRILOSEC) 40 MG capsule Take 1 capsule (40 mg total) by mouth daily. 30 capsule 12 05/21/2016  . ondansetron (ZOFRAN) 4 MG tablet Take 1 tablet (4 mg total) by mouth 2 (two) times daily. 60 tablet 3 05/24/2016 at am  . Oxycodone HCl 10 MG TABS Take 10 mg by mouth 5 (five) times daily as needed (for pain).   05/24/2016 at am  . promethazine (PHENERGAN) 25 MG suppository Place 1 suppository (25 mg total) rectally every 6 (six) hours as needed for nausea or vomiting. 12 each 2  unknown  . promethazine (PHENERGAN) 25 MG tablet TAKE 1 TABLET BY MOUTH EVERY 6 HOURS AS NEEDED FOR NAUSEA AND VOMITING 30 tablet 0 05/23/2016 at Unknown time  . tiZANidine (ZANAFLEX) 4 MG tablet Take 4 mg by mouth 3 (three) times daily.   05/21/2016  . topiramate (TOPAMAX) 50 MG tablet Take 1 tablet (50 mg total) by mouth at bedtime. 30 tablet 3 05/23/2016 at Unknown time  . Vedolizumab (ENTYVIO IV) Inject into the vein every 8 (eight) weeks.   2 weeks ago  . zolmitriptan (ZOMIG) 5 MG nasal solution 1 spray in one nostril.  May repeat 1 spray once after 2 hours if needed. Not to exceed 2 sprays in 24 hours (Patient taking differently: Place 1 spray into the nose as needed for migraine. 1 spray in one nostril.  May repeat 1 spray once after 2 hours if needed. Not to exceed 2 sprays in 24 hours) 6 Units 0 unknown   Scheduled: . amLODipine  10 mg Oral Daily  . chlorhexidine  15 mL Mouth Rinse BID  . enoxaparin (LOVENOX) injection  40 mg Subcutaneous QHS  . gabapentin  1,200 mg Oral QAC supper  . gabapentin  1,800 mg Oral QHS  . gabapentin  600 mg Oral QAC lunch  .  mouth rinse  15 mL Mouth Rinse q12n4p  . methylPREDNISolone (SOLU-MEDROL) injection  60 mg Intravenous Q12H  . mometasone-formoterol  2 puff Inhalation BID  . pantoprazole  40 mg Oral Daily  . promethazine  25 mg Intravenous Once  . tiZANidine  4 mg Oral TID  . topiramate  50 mg Oral QHS   Continuous:  Anti-infectives    None      Results for orders placed or performed during the hospital encounter of 05/24/16 (from the past 48 hour(s))  Urinalysis, Routine w reflex microscopic     Status: Abnormal   Collection Time: 05/24/16 12:05 PM  Result Value Ref Range   Color, Urine AMBER (A) YELLOW    Comment: BIOCHEMICALS MAY BE AFFECTED BY COLOR   APPearance HAZY (A) CLEAR   Specific Gravity, Urine 1.028 1.005 - 1.030   pH 5.0 5.0 - 8.0   Glucose, UA NEGATIVE NEGATIVE mg/dL   Hgb urine dipstick NEGATIVE NEGATIVE   Bilirubin Urine SMALL (A) NEGATIVE   Ketones, ur 20 (A) NEGATIVE mg/dL   Protein, ur 30 (A) NEGATIVE mg/dL   Nitrite NEGATIVE NEGATIVE   Leukocytes, UA TRACE (A) NEGATIVE   RBC / HPF 0-5 0 - 5 RBC/hpf   WBC, UA 6-30 0 - 5 WBC/hpf   Bacteria, UA FEW (A) NONE SEEN   Squamous Epithelial / LPF 6-30 (A) NONE SEEN   Mucous PRESENT    Hyaline Casts, UA PRESENT   Lipase, blood     Status: None   Collection Time: 05/24/16 12:15 PM  Result Value Ref Range   Lipase 20 11 - 51 U/L  Comprehensive metabolic panel     Status: Abnormal   Collection Time: 05/24/16 12:15 PM  Result Value Ref Range   Sodium 136 135 - 145 mmol/L   Potassium 3.0 (L) 3.5 - 5.1 mmol/L   Chloride 103 101 - 111 mmol/L   CO2 21 (L) 22 - 32 mmol/L   Glucose, Bld 113 (H) 65 - 99 mg/dL   BUN 16 6 - 20 mg/dL   Creatinine, Ser 0.77 0.44 - 1.00 mg/dL   Calcium 9.2 8.9 - 10.3 mg/dL   Total Protein 8.7 (H) 6.5 - 8.1  g/dL   Albumin 4.0 3.5 - 5.0 g/dL   AST 19 15 - 41 U/L   ALT 17 14 - 54 U/L   Alkaline Phosphatase 113 38 - 126 U/L   Total Bilirubin 0.9 0.3 - 1.2 mg/dL   GFR calc non Af Amer >60 >60 mL/min    GFR calc Af Amer >60 >60 mL/min    Comment: (NOTE) The eGFR has been calculated using the CKD EPI equation. This calculation has not been validated in all clinical situations. eGFR's persistently <60 mL/min signify possible Chronic Kidney Disease.    Anion gap 12 5 - 15  CBC     Status: Abnormal   Collection Time: 05/24/16 12:15 PM  Result Value Ref Range   WBC 11.8 (H) 4.0 - 10.5 K/uL   RBC 5.04 3.87 - 5.11 MIL/uL   Hemoglobin 15.0 12.0 - 15.0 g/dL   HCT 43.9 36.0 - 46.0 %   MCV 87.1 78.0 - 100.0 fL   MCH 29.8 26.0 - 34.0 pg   MCHC 34.2 30.0 - 36.0 g/dL   RDW 14.4 11.5 - 15.5 %   Platelets 312 150 - 400 K/uL  Magnesium     Status: Abnormal   Collection Time: 05/25/16 12:43 AM  Result Value Ref Range   Magnesium 1.3 (L) 1.7 - 2.4 mg/dL  Basic metabolic panel     Status: Abnormal   Collection Time: 05/25/16  4:19 AM  Result Value Ref Range   Sodium 140 135 - 145 mmol/L   Potassium 3.4 (L) 3.5 - 5.1 mmol/L   Chloride 108 101 - 111 mmol/L   CO2 21 (L) 22 - 32 mmol/L   Glucose, Bld 106 (H) 65 - 99 mg/dL   BUN 11 6 - 20 mg/dL   Creatinine, Ser 0.65 0.44 - 1.00 mg/dL   Calcium 8.7 (L) 8.9 - 10.3 mg/dL   GFR calc non Af Amer >60 >60 mL/min   GFR calc Af Amer >60 >60 mL/min    Comment: (NOTE) The eGFR has been calculated using the CKD EPI equation. This calculation has not been validated in all clinical situations. eGFR's persistently <60 mL/min signify possible Chronic Kidney Disease.    Anion gap 11 5 - 15  CBC     Status: None   Collection Time: 05/25/16  4:19 AM  Result Value Ref Range   WBC 7.1 4.0 - 10.5 K/uL   RBC 4.41 3.87 - 5.11 MIL/uL   Hemoglobin 12.8 12.0 - 15.0 g/dL   HCT 38.8 36.0 - 46.0 %   MCV 88.0 78.0 - 100.0 fL   MCH 29.0 26.0 - 34.0 pg   MCHC 33.0 30.0 - 36.0 g/dL   RDW 14.3 11.5 - 15.5 %   Platelets 218 150 - 400 K/uL    Ct Abdomen Pelvis W Contrast  Result Date: 05/24/2016 CLINICAL DATA:  Intermittent vomiting with generalized abdominal  pain EXAM: CT ABDOMEN AND PELVIS WITH CONTRAST TECHNIQUE: Multidetector CT imaging of the abdomen and pelvis was performed using the standard protocol following bolus administration of intravenous contrast. CONTRAST:  133m ISOVUE-300 IOPAMIDOL (ISOVUE-300) INJECTION 61% COMPARISON:  02/20/2016 FINDINGS: Lower chest: Lung bases demonstrate no acute consolidation or pleural effusion. Normal heart size. Hepatobiliary: No focal hepatic abnormality. Intra and extrahepatic biliary dilatation unchanged status post cholecystectomy. Pancreas: Unremarkable. No pancreatic ductal dilatation or surrounding inflammatory changes. Spleen: Normal in size without focal abnormality. Adrenals/Urinary Tract: Adrenal glands within normal limits. Stable subcentimeter hypodense lesion mid left kidney. No hydronephrosis. Bladder  normal. Stomach/Bowel: Fluid-filled small bowel loops with mild mucosal enhancement. Decompressed distal small bowel loops with transition from fluid filled bowel to decompressed bowel, series 2, image number 64. No significant bowel wall thickening. Postsurgical changes of the colon with anastomosis in the right abdomen. Diffuse fluid-filled colon without wall thickening. Vascular/Lymphatic: Aortic atherosclerosis. No enlarged abdominal or pelvic lymph nodes. Reproductive: Uterus and bilateral adnexa are unremarkable. Other: No free air. No free fluid. Postsurgical changes of the ventral abdominal wall. Musculoskeletal: No acute or suspicious bone lesions. Degenerative changes. IMPRESSION: 1. Fluid-filled loops of small bowel within the central and lower abdomen with scattered areas of mucosal enhancement could relate to enteritis or mild ileus. Transition from fluid-filled bowel to decompressed small bowel in the right upper pelvis, cannot exclude partial or developing obstruction. Diffuse fluid-filled colon but without focal wall thickening to suggest an acute colitis. 2. Intra and extrahepatic biliary  dilatation status post cholecystectomy. Electronically Signed   By: Donavan Foil Jessica.D.   On: 05/24/2016 18:24   Dg Abd Portable 1v  Result Date: 05/24/2016 CLINICAL DATA:  NG tube placement EXAM: PORTABLE ABDOMEN - 1 VIEW COMPARISON:  None. FINDINGS: There is residual contrast within the renal collecting systems and bladder. Esophageal tube tip overlies the mid stomach. Surgical suture in the right abdomen IMPRESSION: Esophageal tube tip and side port overlie a the mid to proximal stomach. Electronically Signed   By: Donavan Foil Jessica.D.   On: 05/24/2016 21:57    Review of Systems  Constitutional: Positive for chills and weight loss (6-8 labs last 2 weeks). Negative for diaphoresis, fever and malaise/fatigue.  HENT: Negative.   Eyes: Negative.   Respiratory: Negative.   Cardiovascular: Negative.   Gastrointestinal: Positive for abdominal pain, diarrhea (chronic, better with Creon.), heartburn (occasional), nausea (Nausea and vomiting since 02/2016, many things she could take, but now N/V with anything PO) and vomiting. Negative for blood in stool, constipation and melena.  Genitourinary: Negative.   Musculoskeletal: Negative.   Skin: Positive for rash (Psoriasis elbow and right leg). Negative for itching.  Neurological: Negative.  Negative for weakness.  Endo/Heme/Allergies: Negative.   Psychiatric/Behavioral: Negative.    Blood pressure (!) 141/78, pulse 81, temperature 98.8 F (37.1 C), temperature source Oral, resp. rate 12, height 5' 7"  (1.702 Jessica), weight 76.6 kg (168 lb 14 oz), SpO2 98 %. Physical Exam  Constitutional: She is oriented to person, place, and time. She appears well-developed and well-nourished. No distress.  Uncomfortable with NG in.  NG working well.  HENT:  Head: Normocephalic and atraumatic.  Mouth/Throat: No oropharyngeal exudate.  Eyes: Right eye exhibits discharge. Left eye exhibits no discharge. No scleral icterus.  Neck: Normal range of motion. Neck supple. No  JVD present. No tracheal deviation present. No thyromegaly present.  Cardiovascular: Normal rate, regular rhythm, normal heart sounds and intact distal pulses.   No murmur heard. Respiratory: Effort normal and breath sounds normal. No respiratory distress. She has no wheezes. She has no rales. She exhibits no tenderness.  GI: Soft. She exhibits no distension and no mass. There is no tenderness. There is no rebound and no guarding.  No acute pain on exam.  She has chronic discomfort lower abdomen below the umbilicus.  Musculoskeletal: Normal range of motion. She exhibits no edema or tenderness.  Lymphadenopathy:    She has no cervical adenopathy.  Neurological: She is alert and oriented to person, place, and time. No cranial nerve deficit.  Skin: Skin is warm and dry. Rash (she has  some active psoriasis elbows and right leg.) noted. She is not diaphoretic. No erythema. No pallor.  Psychiatric: She has a normal mood and affect. Her behavior is normal. Judgment and thought content normal.    Assessment/Plan: SBO Crohn's disease on Entyvio Prior ileocecectomy/appendectomy 10/2008 & Right colectomy 09/2010 Chronic pancreatic insuffiencey  - chronic diarrhea on Creon Hypokalemia Dehydration  Gastritis Hx of migraines since 11/2015 Hx of hypertension Hx of hyperlipidemia FEN:  NPO/IV fluids ID:  No abx DVT: Lovenox   Plan:  SB protocol, continue NG, on steroid for Crohn's and PPI.  Will follow with you.  [name] 05/25/2016, 11:09 AM

## 2016-05-25 NOTE — Progress Notes (Signed)
Nursing Note: Received report from [place] the ED.at 2230,pt arrived at 2245.Pt awake,alert w/ NG tube in place.Alert and Ox4.T-98.9 P-90 R-14 Bp-149/83 PO2 97% on R/A.wbb

## 2016-05-25 NOTE — Progress Notes (Signed)
Gastrografin adminstered via NGT. Radiology notified. NGT clamped. Will continue to monitor.

## 2016-05-26 ENCOUNTER — Inpatient Hospital Stay (HOSPITAL_COMMUNITY): Payer: BLUE CROSS/BLUE SHIELD

## 2016-05-26 ENCOUNTER — Ambulatory Visit (HOSPITAL_COMMUNITY): Payer: BLUE CROSS/BLUE SHIELD

## 2016-05-26 DIAGNOSIS — K56609 Unspecified intestinal obstruction, unspecified as to partial versus complete obstruction: Secondary | ICD-10-CM

## 2016-05-26 DIAGNOSIS — K50012 Crohn's disease of small intestine with intestinal obstruction: Secondary | ICD-10-CM

## 2016-05-26 LAB — CBC
HEMATOCRIT: 33.3 % — AB (ref 36.0–46.0)
HEMOGLOBIN: 10.9 g/dL — AB (ref 12.0–15.0)
MCH: 28.9 pg (ref 26.0–34.0)
MCHC: 32.7 g/dL (ref 30.0–36.0)
MCV: 88.3 fL (ref 78.0–100.0)
Platelets: 227 10*3/uL (ref 150–400)
RBC: 3.77 MIL/uL — ABNORMAL LOW (ref 3.87–5.11)
RDW: 14.5 % (ref 11.5–15.5)
WBC: 5.8 10*3/uL (ref 4.0–10.5)

## 2016-05-26 LAB — BASIC METABOLIC PANEL
Anion gap: 9 (ref 5–15)
BUN: 16 mg/dL (ref 6–20)
CALCIUM: 8.9 mg/dL (ref 8.9–10.3)
CHLORIDE: 111 mmol/L (ref 101–111)
CO2: 24 mmol/L (ref 22–32)
Creatinine, Ser: 0.62 mg/dL (ref 0.44–1.00)
GFR calc Af Amer: >60 mL/min (ref >60)
GFR calc non Af Amer: >60 mL/min (ref >60)
GLUCOSE: 142 mg/dL — AB (ref 65–99)
Potassium: 3.4 mmol/L — ABNORMAL LOW (ref 3.5–5.1)
Sodium: 144 mmol/L (ref 135–145)

## 2016-05-26 LAB — HIV ANTIBODY (ROUTINE TESTING W REFLEX): HIV SCREEN 4TH GENERATION: NONREACTIVE

## 2016-05-26 MED ORDER — MAGNESIUM SULFATE 2 GM/50ML IV SOLN
2.0000 g | Freq: Once | INTRAVENOUS | Status: AC
  Administered 2016-05-26: 2 g via INTRAVENOUS
  Filled 2016-05-26: qty 50

## 2016-05-26 MED ORDER — LORAZEPAM 2 MG/ML IJ SOLN
0.2500 mg | Freq: Four times a day (QID) | INTRAMUSCULAR | Status: DC | PRN
  Administered 2016-05-26 – 2016-06-01 (×11): 0.25 mg via INTRAVENOUS
  Filled 2016-05-26 (×12): qty 1

## 2016-05-26 MED ORDER — PROCHLORPERAZINE EDISYLATE 5 MG/ML IJ SOLN
10.0000 mg | Freq: Four times a day (QID) | INTRAMUSCULAR | Status: DC | PRN
  Administered 2016-05-26 – 2016-06-01 (×17): 10 mg via INTRAVENOUS
  Filled 2016-05-26 (×17): qty 2

## 2016-05-26 MED ORDER — POTASSIUM CHLORIDE 2 MEQ/ML IV SOLN: 30.0000 meq | Freq: Once | INTRAVENOUS | Status: DC

## 2016-05-26 MED ORDER — POTASSIUM CHLORIDE 10 MEQ/100ML IV SOLN
10.0000 meq | INTRAVENOUS | Status: AC
  Administered 2016-05-26 (×3): 10 meq via INTRAVENOUS
  Filled 2016-05-26 (×3): qty 100

## 2016-05-26 NOTE — Progress Notes (Signed)
[facility] Progress Note  Subjective:  Feels terrible.  Has been vomiting a lot.  Says that it was almost non-stop for 4 hours.  NGT was apparently not hooked up correctly so was not suctioning.  Has had 3 loose BM's since yesterday afternoon.  Objective:  Vital signs in last 24 hours: Temp:  [98.2 F (36.8 C)-98.9 F (37.2 C)] 98.2 F (36.8 C) (03/22 0447) Pulse Rate:  [87-107] 87 (03/22 0447) Resp:  [12-17] 12 (03/22 0447) BP: (140-166)/(71-94) 140/71 (03/22 0447) SpO2:  [96 %-98 %] 98 % (03/22 0447) Last BM Date: 05/24/16 General:  Alert, Well-developed, in NAD, but appears miserable. Heart:  Regular rate and rhythm; no murmurs Pulm:  CTAB.  No W/R/R. Abdomen:  Soft, non-distended.  BS present but quiet and seem to be more sparse than previously.  Lower abdominal TTP. Extremities:  Without edema. Neurologic:  Alert and oriented x 4;  grossly normal neurologically. Psych:  Alert and cooperative. Normal mood and affect.  Intake/Output from previous day: 03/21 0701 - 03/22 0700 In: 200 [IV Piggyback:200] Out: 551 [Emesis/NG output:550; Stool:1] Intake/Output this shift: Total I/O In: -  Out: 1 [Stool:1]  Lab Results:  Recent Labs  05/24/16 1215 05/25/16 0419 05/26/16 0419  WBC 11.8* 7.1 5.8  HGB 15.0 12.8 10.9*  HCT 43.9 38.8 33.3*  PLT 312 218 227   BMET  Recent Labs  05/24/16 1215 05/25/16 0419 05/26/16 0419  NA 136 140 144  K 3.0* 3.4* 3.4*  CL 103 108 111  CO2 21* 21* 24  GLUCOSE 113* 106* 142*  BUN 16 11 16   CREATININE 0.77 0.65 0.62  CALCIUM 9.2 8.7* 8.9   LFT  Recent Labs  05/24/16 1215  PROT 8.7*  ALBUMIN 4.0  AST 19  ALT 17  ALKPHOS 113  BILITOT 0.9   Ct Abdomen Pelvis W Contrast  Result Date: 05/24/2016 CLINICAL DATA:  Intermittent vomiting with generalized abdominal pain EXAM: CT ABDOMEN AND PELVIS WITH CONTRAST TECHNIQUE: Multidetector CT imaging of the abdomen and pelvis was performed using the standard  protocol following bolus administration of intravenous contrast. CONTRAST:  166m ISOVUE-300 IOPAMIDOL (ISOVUE-300) INJECTION 61% COMPARISON:  02/20/2016 FINDINGS: Lower chest: Lung bases demonstrate no acute consolidation or pleural effusion. Normal heart size. Hepatobiliary: No focal hepatic abnormality. Intra and extrahepatic biliary dilatation unchanged status post cholecystectomy. Pancreas: Unremarkable. No pancreatic ductal dilatation or surrounding inflammatory changes. Spleen: Normal in size without focal abnormality. Adrenals/Urinary Tract: Adrenal glands within normal limits. Stable subcentimeter hypodense lesion mid left kidney. No hydronephrosis. Bladder normal. Stomach/Bowel: Fluid-filled small bowel loops with mild mucosal enhancement. Decompressed distal small bowel loops with transition from fluid filled bowel to decompressed bowel, series 2, image number 64. No significant bowel wall thickening. Postsurgical changes of the colon with anastomosis in the right abdomen. Diffuse fluid-filled colon without wall thickening. Vascular/Lymphatic: Aortic atherosclerosis. No enlarged abdominal or pelvic lymph nodes. Reproductive: Uterus and bilateral adnexa are unremarkable. Other: No free air. No free fluid. Postsurgical changes of the ventral abdominal wall. Musculoskeletal: No acute or suspicious bone lesions. Degenerative changes. IMPRESSION: 1. Fluid-filled loops of small bowel within the central and lower abdomen with scattered areas of mucosal enhancement could relate to enteritis or mild ileus. Transition from fluid-filled bowel to decompressed small bowel in the right upper pelvis, cannot exclude partial or developing obstruction. Diffuse fluid-filled colon but without focal wall thickening to suggest an acute colitis. 2. Intra and extrahepatic biliary dilatation status post cholecystectomy. Electronically Signed  By: Donavan Foil M.D.   On: 05/24/2016 18:24   Dg Abd Portable 1v-small Bowel  Obstruction Protocol-initial, 8 Hr Delay  Result Date: 05/25/2016 CLINICAL DATA:  56 year old female with small bowel obstruction. 8 hour delayed image. EXAM: PORTABLE ABDOMEN - 1 VIEW COMPARISON:  Abdominal CT dated 05/24/2016 and radiographs dated 05/25/2016 and 05/24/2016 FINDINGS: Oral contrast is seen in the distal small bowel as well as throughout the colon and rectosigmoid. There is mild dilatation of the small bowel in the right lower quadrant measuring 3.5 cm in diameter. Chief an enteric tube is noted within the stomach. No free air identified. Right upper quadrant cholecystectomy clips. IMPRESSION: Mildly dilated small bowel loop in the right lower quadrant measuring 3.5 cm in diameter. There is however passage of contrast into the colon. Electronically Signed   By: Anner Crete M.D.   On: 05/25/2016 22:29   Dg Abd Portable 1v-small Bowel Protocol-position Verification  Result Date: 05/25/2016 CLINICAL DATA:  Nasogastric tube placement. EXAM: PORTABLE ABDOMEN - 1 VIEW COMPARISON:  Radiograph of May 24, 2016. FINDINGS: The bowel gas pattern is normal. Distal tip of nasogastric tube is seen in expected position of stomach. Status post cholecystectomy. IMPRESSION: Distal tip of nasogastric tube seen in expected position of stomach. Electronically Signed   By: Marijo Conception, M.D.   On: 05/25/2016 13:53   Dg Abd Portable 1v  Result Date: 05/24/2016 CLINICAL DATA:  NG tube placement EXAM: PORTABLE ABDOMEN - 1 VIEW COMPARISON:  None. FINDINGS: There is residual contrast within the renal collecting systems and bladder. Esophageal tube tip overlies the mid stomach. Surgical suture in the right abdomen IMPRESSION: Esophageal tube tip and side port overlie a the mid to proximal stomach. Electronically Signed   By: Donavan Foil M.D.   On: 05/24/2016 21:57   Assessment / Plan: 61. 56 year old female with small and large bowel Crohn's disease s/pterminal ileal resection in 2008 and colon resection  in 2012. Maintained on Entyvio Q8 weeks.Here with N/V/abdominal pain and inability to take PO for the past week.  CT scan suggesting at least partial SBO.  ? Mechanical from adhesions or secondary to Crohn's disease (some active disease, but ? Stricture).  Surgery is following as well and are planning a small bowel protocol to evaluate.    2.  Hypokalemia:  Potassium 3.0 on admission and up to 3.4 today.  3. Pancreatic insufficiency.Will resume Creon when able to take PO.  -Will follow with surgery.  Follow-up results of remaining small bowel films. -Correct electrolytes, try to keep K+ above 4. -Continue NGT. -Ok to continue IV steroids for now.   LOS: 2 days   ZEHR, JESSICA D.  05/26/2016, 9:12 AM  Pager number [phone]  GI ATTENDING  Interval history and data reviewed. Agree with above interval progress note as outlined including assessment and plans.  Docia Chuck. Geri Seminole., M.D. [facility] Division of Gastroenterology

## 2016-05-26 NOTE — Progress Notes (Signed)
PROGRESS NOTE    Jessica IRACHETA  MVE:[id number] DOB: Apr 09, 1960 DOA: 05/24/2016 PCP: [name], DO   Chief Complaint  Patient presents with  . Emesis  . Abdominal Pain  . Fatigue    Brief Narrative:  HPI on 05/24/2016 by Dr. Karmen Bongo [name] is a 56 y.o. female with medical history significant of Crohn's disease with prior surgeries and obstruction; kidney stones; HTN; HLD; and GERD presenting with n/v for over a week.  Marked abdominal pain.  Uncertain etiology.  +Crohn's, but this is unlike her usual flares.  Last BM was yesterday, was its usual watery consistency.  No fevers.  She was 5 pounds heavier 2 weeks ago.  Assessment & Plan   Nausea/vomiting/Abdominal pain/ Possible Small Bowel Obstruction -CT abd/pelvis: Fluid-filled loops of small bowel within the central and lower abdomen with scattered areas of mucosal enhancement could relate to enteritis or mild ileus. Cannot exclude partial or developing obstruction -Patient has had multiple abdominal surgeries -Currently has NG tube in place, and feels nausea has improved -Recently seen by Dr. Silverio Decamp, GI- had EGD on 05/03/16, which showed residual food in the stomach- ?gastroparesis -General surgery and Gastroenterology consulted and appreciated -Continue conservative care, IVF, pain management, antiemetics, NPO -Unfortunately, patient's NG tube was disconnected overnight, and reconnected to suction to blue sump port -Patient currently feeling very nauseous and having vomiting -Will place patient on compazine as well as phenergan. Will add on Ativan to help with anxiety as well.   Hypokalemia/ hypomagnesium -Likely secondary to GI losses -Continue to replace and monitor BMP  Hyperglycemia -?stress response and on steroids -Continue to monitor -No history of diabetes  Essential hypertension -Amlodipine discontinued -place on IV hydralazine PRN  Crohn's disease -Currently on no outpatient  medications -Sees Dr. Silverio Decamp -GI consulted and appreciated -Continue IV steroids  DVT Prophylaxis  Lovenox  Code Status: Full  Family Communication: None at bedside  Disposition Plan: Admitted, continue conservative management.   Consultants Gastroenterology General surgery  Procedures  None  Antibiotics   Anti-infectives    None      Subjective:   [name] seen and examined today.  Feels very nauseous this morning with active vomiting. Denies chest pain, shortness of breath.  Had 2 bowel movements yesterday.    Objective:   Vitals:   05/25/16 2039 05/25/16 2056 05/25/16 2120 05/26/16 0447  BP: (!) 166/94 (!) 150/72  140/71  Pulse:    87  Resp:    12  Temp:    98.2 F (36.8 C)  TempSrc:    Oral  SpO2:   96% 98%  Weight:      Height:        Intake/Output Summary (Last 24 hours) at 05/26/16 1216 Last data filed at 05/26/16 1127  Gross per 24 hour  Intake              200 ml  Output              154 ml  Net               46 ml   Filed Weights   05/24/16 1148 05/24/16 2244  Weight: 77.1 kg (170 lb) 76.6 kg (168 lb 14 oz)    Exam  General: Well developed, well nourished, NAD, appears stated age  HEENT: NCAT, mucous membranes moist. NG tube in place  Cardiovascular: S1 S2 auscultated, no rubs, murmurs or gallops. Regular rate and rhythm.  Respiratory: Clear to auscultation bilaterally  with equal chest rise  Abdomen: Soft, mild TTP, nondistended, no bowel sounds  Extremities: warm dry without cyanosis clubbing or edema  Neuro: AAOx3, nonfocal  Psych: Appropriate mood and affect   Data Reviewed: I have personally reviewed following labs and imaging studies  CBC:  Recent Labs Lab 05/24/16 1215 05/25/16 0419 05/26/16 0419  WBC 11.8* 7.1 5.8  HGB 15.0 12.8 10.9*  HCT 43.9 38.8 33.3*  MCV 87.1 88.0 88.3  PLT 312 218 235   Basic Metabolic Panel:  Recent Labs Lab 05/24/16 1215 05/25/16 0043 05/25/16 0419 05/26/16 0419  NA  136  --  140 144  K 3.0*  --  3.4* 3.4*  CL 103  --  108 111  CO2 21*  --  21* 24  GLUCOSE 113*  --  106* 142*  BUN 16  --  11 16  CREATININE 0.77  --  0.65 0.62  CALCIUM 9.2  --  8.7* 8.9  MG  --  1.3*  --   --    GFR: Estimated Creatinine Clearance: 83.8 mL/min (by C-G formula based on SCr of 0.62 mg/dL). Liver Function Tests:  Recent Labs Lab 05/24/16 1215  AST 19  ALT 17  ALKPHOS 113  BILITOT 0.9  PROT 8.7*  ALBUMIN 4.0    Recent Labs Lab 05/24/16 1215  LIPASE 20   No results for input(s): AMMONIA in the last 168 hours. Coagulation Profile: No results for input(s): INR, PROTIME in the last 168 hours. Cardiac Enzymes: No results for input(s): CKTOTAL, CKMB, CKMBINDEX, TROPONINI in the last 168 hours. BNP (last 3 results) No results for input(s): PROBNP in the last 8760 hours. HbA1C: No results for input(s): HGBA1C in the last 72 hours. CBG: No results for input(s): GLUCAP in the last 168 hours. Lipid Profile: No results for input(s): CHOL, HDL, LDLCALC, TRIG, CHOLHDL, LDLDIRECT in the last 72 hours. Thyroid Function Tests: No results for input(s): TSH, T4TOTAL, FREET4, T3FREE, THYROIDAB in the last 72 hours. Anemia Panel: No results for input(s): VITAMINB12, FOLATE, FERRITIN, TIBC, IRON, RETICCTPCT in the last 72 hours. Urine analysis:    Component Value Date/Time   COLORURINE AMBER (A) 05/24/2016 1205   APPEARANCEUR HAZY (A) 05/24/2016 1205   LABSPEC 1.028 05/24/2016 1205   PHURINE 5.0 05/24/2016 1205   GLUCOSEU NEGATIVE 05/24/2016 1205   HGBUR NEGATIVE 05/24/2016 1205   BILIRUBINUR SMALL (A) 05/24/2016 1205   KETONESUR 20 (A) 05/24/2016 1205   PROTEINUR 30 (A) 05/24/2016 1205   UROBILINOGEN 0.2 03/29/2013 0919   NITRITE NEGATIVE 05/24/2016 1205   LEUKOCYTESUR TRACE (A) 05/24/2016 1205   Sepsis Labs: @LABRCNTIP (procalcitonin:4,lacticidven:4)  )No results found for this or any previous visit (from the past 240 hour(s)).    Radiology Studies: Ct  Abdomen Pelvis W Contrast  Result Date: 05/24/2016 CLINICAL DATA:  Intermittent vomiting with generalized abdominal pain EXAM: CT ABDOMEN AND PELVIS WITH CONTRAST TECHNIQUE: Multidetector CT imaging of the abdomen and pelvis was performed using the standard protocol following bolus administration of intravenous contrast. CONTRAST:  12m ISOVUE-300 IOPAMIDOL (ISOVUE-300) INJECTION 61% COMPARISON:  02/20/2016 FINDINGS: Lower chest: Lung bases demonstrate no acute consolidation or pleural effusion. Normal heart size. Hepatobiliary: No focal hepatic abnormality. Intra and extrahepatic biliary dilatation unchanged status post cholecystectomy. Pancreas: Unremarkable. No pancreatic ductal dilatation or surrounding inflammatory changes. Spleen: Normal in size without focal abnormality. Adrenals/Urinary Tract: Adrenal glands within normal limits. Stable subcentimeter hypodense lesion mid left kidney. No hydronephrosis. Bladder normal. Stomach/Bowel: Fluid-filled small bowel loops with mild mucosal enhancement.  Decompressed distal small bowel loops with transition from fluid filled bowel to decompressed bowel, series 2, image number 64. No significant bowel wall thickening. Postsurgical changes of the colon with anastomosis in the right abdomen. Diffuse fluid-filled colon without wall thickening. Vascular/Lymphatic: Aortic atherosclerosis. No enlarged abdominal or pelvic lymph nodes. Reproductive: Uterus and bilateral adnexa are unremarkable. Other: No free air. No free fluid. Postsurgical changes of the ventral abdominal wall. Musculoskeletal: No acute or suspicious bone lesions. Degenerative changes. IMPRESSION: 1. Fluid-filled loops of small bowel within the central and lower abdomen with scattered areas of mucosal enhancement could relate to enteritis or mild ileus. Transition from fluid-filled bowel to decompressed small bowel in the right upper pelvis, cannot exclude partial or developing obstruction. Diffuse  fluid-filled colon but without focal wall thickening to suggest an acute colitis. 2. Intra and extrahepatic biliary dilatation status post cholecystectomy. Electronically Signed   By: Donavan Foil M.D.   On: 05/24/2016 18:24   Dg Abd 2 Views  Result Date: 05/26/2016 CLINICAL DATA:  Follow-up small bowel obstruction EXAM: ABDOMEN - 2 VIEW COMPARISON:  05/25/2016 FINDINGS: NG tube tip is in the mid stomach. No small bowel dilatation currently. No free air organomegaly. Prior cholecystectomy. IMPRESSION: No current evidence for bowel obstruction. Electronically Signed   By: Rolm Baptise M.D.   On: 05/26/2016 11:56   Dg Abd Portable 1v-small Bowel Obstruction Protocol-initial, 8 Hr Delay  Result Date: 05/25/2016 CLINICAL DATA:  56 year old female with small bowel obstruction. 8 hour delayed image. EXAM: PORTABLE ABDOMEN - 1 VIEW COMPARISON:  Abdominal CT dated 05/24/2016 and radiographs dated 05/25/2016 and 05/24/2016 FINDINGS: Oral contrast is seen in the distal small bowel as well as throughout the colon and rectosigmoid. There is mild dilatation of the small bowel in the right lower quadrant measuring 3.5 cm in diameter. Chief an enteric tube is noted within the stomach. No free air identified. Right upper quadrant cholecystectomy clips. IMPRESSION: Mildly dilated small bowel loop in the right lower quadrant measuring 3.5 cm in diameter. There is however passage of contrast into the colon. Electronically Signed   By: Anner Crete M.D.   On: 05/25/2016 22:29   Dg Abd Portable 1v-small Bowel Protocol-position Verification  Result Date: 05/25/2016 CLINICAL DATA:  Nasogastric tube placement. EXAM: PORTABLE ABDOMEN - 1 VIEW COMPARISON:  Radiograph of May 24, 2016. FINDINGS: The bowel gas pattern is normal. Distal tip of nasogastric tube is seen in expected position of stomach. Status post cholecystectomy. IMPRESSION: Distal tip of nasogastric tube seen in expected position of stomach. Electronically  Signed   By: Marijo Conception, M.D.   On: 05/25/2016 13:53   Dg Abd Portable 1v  Result Date: 05/24/2016 CLINICAL DATA:  NG tube placement EXAM: PORTABLE ABDOMEN - 1 VIEW COMPARISON:  None. FINDINGS: There is residual contrast within the renal collecting systems and bladder. Esophageal tube tip overlies the mid stomach. Surgical suture in the right abdomen IMPRESSION: Esophageal tube tip and side port overlie a the mid to proximal stomach. Electronically Signed   By: Donavan Foil M.D.   On: 05/24/2016 21:57     Scheduled Meds: . chlorhexidine  15 mL Mouth Rinse BID  . enoxaparin (LOVENOX) injection  40 mg Subcutaneous QHS  . mouth rinse  15 mL Mouth Rinse q12n4p  . methylPREDNISolone (SOLU-MEDROL) injection  60 mg Intravenous Q12H  . mometasone-formoterol  2 puff Inhalation BID  . pantoprazole (PROTONIX) IV  40 mg Intravenous Daily  . potassium chloride  10 mEq Intravenous Q1  Hr x 3  . promethazine  25 mg Intravenous Once   Continuous Infusions:   LOS: 2 days   Time Spent in minutes   30 minutes  Jazari Ober D.O. on 05/26/2016 at 12:16 PM  Between 7am to 7pm - Pager - [phone]  After 7pm go to [email or web address] - password [email or web address]  And look for the night coverage person covering for me after hours  [facility] Office  [phone]

## 2016-05-26 NOTE — Progress Notes (Signed)
I came back to check on her and she is still having nausea and some dry heaves.  I changed out the filter on the sump, but she notes she take PO phenergan 25 mg q6h at home.  The NG is working and the SBO is resolved.  She is having multiple BMs now.  I am calling GI and will talk over with them again.  I don't think we can blame the SBO for this now.

## 2016-05-26 NOTE — Progress Notes (Signed)
Subjective: Film not done yet and she is vomiting.  NG hooked up incorrectly and I have fixed it.  [place] is almost full.    Objective: Vital signs in last 24 hours: Temp:  [98.2 F (36.8 C)-98.9 F (37.2 C)] 98.2 F (36.8 C) (03/22 0447) Pulse Rate:  [87-107] 87 (03/22 0447) Resp:  [12-17] 12 (03/22 0447) BP: (140-166)/(71-94) 140/71 (03/22 0447) SpO2:  [96 %-98 %] 98 % (03/22 0447) Last BM Date: 05/24/16  200 IV recored Urine x 3 NG 550 one shift recorded BM x 1 recorded Afebrile, VSS K+ 3.4 H/H down 5 grams with hydration   Film this AM: Oral contrast is seen in the distal small bowel as well as throughout the colon and rectosigmoid. There is mild dilatation of the small bowel in the right lower quadrant measuring 3.5 cm in diameter. Chief an enteric tube is noted within the stomach. No free air identified. Right upper quadrant cholecystectomy clips.  Intake/Output from previous day: 03/21 0701 - 03/22 0700 In: 200 [IV Piggyback:200] Out: 551 [Emesis/NG output:550; Stool:1] Intake/Output this shift: Total I/O In: -  Out: 1 [Stool:1]  General appearance: alert, cooperative, mild distress and vomiting, NG hooked up incorrectly.   GI: soft, no bowel sounds, had 4 Bm's but vomiting again.  I fixed NG film is still pendiing  Lab Results:   Recent Labs  05/25/16 0419 05/26/16 0419  WBC 7.1 5.8  HGB 12.8 10.9*  HCT 38.8 33.3*  PLT 218 227     BMET  Recent Labs  05/25/16 0419 05/26/16 0419  NA 140 144  K 3.4* 3.4*  CL 108 111  CO2 21* 24  GLUCOSE 106* 142*  BUN 11 16  CREATININE 0.65 0.62  CALCIUM 8.7* 8.9   PT/INR No results for input(s): LABPROT, INR in the last 72 hours.   Recent Labs Lab 05/24/16 1215  AST 19  ALT 17  ALKPHOS 113  BILITOT 0.9  PROT 8.7*  ALBUMIN 4.0     Lipase     Component Value Date/Time   LIPASE 20 05/24/2016 1215     Studies/Results: Ct Abdomen Pelvis W Contrast  Result Date: 05/24/2016 CLINICAL  DATA:  Intermittent vomiting with generalized abdominal pain EXAM: CT ABDOMEN AND PELVIS WITH CONTRAST TECHNIQUE: Multidetector CT imaging of the abdomen and pelvis was performed using the standard protocol following bolus administration of intravenous contrast. CONTRAST:  132m ISOVUE-300 IOPAMIDOL (ISOVUE-300) INJECTION 61% COMPARISON:  02/20/2016 FINDINGS: Lower chest: Lung bases demonstrate no acute consolidation or pleural effusion. Normal heart size. Hepatobiliary: No focal hepatic abnormality. Intra and extrahepatic biliary dilatation unchanged status post cholecystectomy. Pancreas: Unremarkable. No pancreatic ductal dilatation or surrounding inflammatory changes. Spleen: Normal in size without focal abnormality. Adrenals/Urinary Tract: Adrenal glands within normal limits. Stable subcentimeter hypodense lesion mid left kidney. No hydronephrosis. Bladder normal. Stomach/Bowel: Fluid-filled small bowel loops with mild mucosal enhancement. Decompressed distal small bowel loops with transition from fluid filled bowel to decompressed bowel, series 2, image number 64. No significant bowel wall thickening. Postsurgical changes of the colon with anastomosis in the right abdomen. Diffuse fluid-filled colon without wall thickening. Vascular/Lymphatic: Aortic atherosclerosis. No enlarged abdominal or pelvic lymph nodes. Reproductive: Uterus and bilateral adnexa are unremarkable. Other: No free air. No free fluid. Postsurgical changes of the ventral abdominal wall. Musculoskeletal: No acute or suspicious bone lesions. Degenerative changes. IMPRESSION: 1. Fluid-filled loops of small bowel within the central and lower abdomen with scattered areas of mucosal enhancement could relate to enteritis or mild  ileus. Transition from fluid-filled bowel to decompressed small bowel in the right upper pelvis, cannot exclude partial or developing obstruction. Diffuse fluid-filled colon but without focal wall thickening to suggest an  acute colitis. 2. Intra and extrahepatic biliary dilatation status post cholecystectomy. Electronically Signed   By: Donavan Foil M.D.   On: 05/24/2016 18:24   Dg Abd Portable 1v-small Bowel Obstruction Protocol-initial, 8 Hr Delay  Result Date: 05/25/2016 CLINICAL DATA:  56 year old female with small bowel obstruction. 8 hour delayed image. EXAM: PORTABLE ABDOMEN - 1 VIEW COMPARISON:  Abdominal CT dated 05/24/2016 and radiographs dated 05/25/2016 and 05/24/2016 FINDINGS: Oral contrast is seen in the distal small bowel as well as throughout the colon and rectosigmoid. There is mild dilatation of the small bowel in the right lower quadrant measuring 3.5 cm in diameter. Chief an enteric tube is noted within the stomach. No free air identified. Right upper quadrant cholecystectomy clips. IMPRESSION: Mildly dilated small bowel loop in the right lower quadrant measuring 3.5 cm in diameter. There is however passage of contrast into the colon. Electronically Signed   By: Anner Crete M.D.   On: 05/25/2016 22:29   Dg Abd Portable 1v-small Bowel Protocol-position Verification  Result Date: 05/25/2016 CLINICAL DATA:  Nasogastric tube placement. EXAM: PORTABLE ABDOMEN - 1 VIEW COMPARISON:  Radiograph of May 24, 2016. FINDINGS: The bowel gas pattern is normal. Distal tip of nasogastric tube is seen in expected position of stomach. Status post cholecystectomy. IMPRESSION: Distal tip of nasogastric tube seen in expected position of stomach. Electronically Signed   By: Marijo Conception, M.D.   On: 05/25/2016 13:53   Dg Abd Portable 1v  Result Date: 05/24/2016 CLINICAL DATA:  NG tube placement EXAM: PORTABLE ABDOMEN - 1 VIEW COMPARISON:  None. FINDINGS: There is residual contrast within the renal collecting systems and bladder. Esophageal tube tip overlies the mid stomach. Surgical suture in the right abdomen IMPRESSION: Esophageal tube tip and side port overlie a the mid to proximal stomach. Electronically Signed    By: Donavan Foil M.D.   On: 05/24/2016 21:57    Medications: . chlorhexidine  15 mL Mouth Rinse BID  . enoxaparin (LOVENOX) injection  40 mg Subcutaneous QHS  . mouth rinse  15 mL Mouth Rinse q12n4p  . methylPREDNISolone (SOLU-MEDROL) injection  60 mg Intravenous Q12H  . mometasone-formoterol  2 puff Inhalation BID  . pantoprazole (PROTONIX) IV  40 mg Intravenous Daily  . potassium chloride  10 mEq Intravenous Q1 Hr x 3  . promethazine  25 mg Intravenous Once     Assessment/Plan SBO Crohn's disease on Entyvio Prior ileocecectomy/appendectomy 10/2008 & Right colectomy 09/2010 Chronic pancreatic insuffiencey  - chronic diarrhea on Creon Hypokalemia Dehydration  Gastritis Hx of migraines since 11/2015 Hx of hypertension Hx of hyperlipidemia FEN:  NPO/IV fluids ID:  No abx DVT: Lovenox    Plan:  Resume suction, film is pending, I ordered some extra phenergan for her now.    Will order another film for AM also.  Continue NPO except ice chips when she is not vomiting.    LOS: 2 days    Tim Corriher 05/26/2016 [phone]

## 2016-05-27 ENCOUNTER — Inpatient Hospital Stay (HOSPITAL_COMMUNITY): Payer: BLUE CROSS/BLUE SHIELD

## 2016-05-27 DIAGNOSIS — E870 Hyperosmolality and hypernatremia: Secondary | ICD-10-CM

## 2016-05-27 LAB — BASIC METABOLIC PANEL
Anion gap: 12 (ref 5–15)
BUN: 33 mg/dL — ABNORMAL HIGH (ref 6–20)
CHLORIDE: 113 mmol/L — AB (ref 101–111)
CO2: 22 mmol/L (ref 22–32)
Calcium: 10.1 mg/dL (ref 8.9–10.3)
Creatinine, Ser: 0.97 mg/dL (ref 0.44–1.00)
GFR calc Af Amer: >60 mL/min (ref >60)
GFR calc non Af Amer: >60 mL/min (ref >60)
Glucose, Bld: 151 mg/dL — ABNORMAL HIGH (ref 65–99)
Potassium: 2.5 mmol/L — CL (ref 3.5–5.1)
SODIUM: 147 mmol/L — AB (ref 135–145)

## 2016-05-27 LAB — CBC
HCT: 42.3 % (ref 36.0–46.0)
Hemoglobin: 13.9 g/dL (ref 12.0–15.0)
MCH: 29.3 pg (ref 26.0–34.0)
MCHC: 32.9 g/dL (ref 30.0–36.0)
MCV: 89.1 fL (ref 78.0–100.0)
PLATELETS: 323 10*3/uL (ref 150–400)
RBC: 4.75 MIL/uL (ref 3.87–5.11)
RDW: 14.7 % (ref 11.5–15.5)
WBC: 9.8 10*3/uL (ref 4.0–10.5)

## 2016-05-27 LAB — MAGNESIUM: Magnesium: 2.4 mg/dL (ref 1.7–2.4)

## 2016-05-27 MED ORDER — POTASSIUM CHLORIDE 10 MEQ/100ML IV SOLN
10.0000 meq | INTRAVENOUS | Status: AC
  Administered 2016-05-27: 10 meq via INTRAVENOUS
  Filled 2016-05-27 (×5): qty 100

## 2016-05-27 MED ORDER — DEXTROSE-NACL 5-0.45 % IV SOLN
INTRAVENOUS | Status: DC
  Administered 2016-05-27 – 2016-05-29 (×4): via INTRAVENOUS
  Filled 2016-05-27 (×8): qty 1000

## 2016-05-27 MED ORDER — POTASSIUM CHLORIDE 10 MEQ/100ML IV SOLN
10.0000 meq | INTRAVENOUS | Status: DC
  Filled 2016-05-27 (×3): qty 100

## 2016-05-27 MED ORDER — POTASSIUM CHLORIDE 2 MEQ/ML IV SOLN: 30.0000 meq | INTRAVENOUS | Status: DC

## 2016-05-27 MED ORDER — POTASSIUM CHLORIDE 10 MEQ/100ML IV SOLN
10.0000 meq | INTRAVENOUS | Status: AC
  Administered 2016-05-27 (×3): 10 meq via INTRAVENOUS
  Filled 2016-05-27 (×6): qty 100

## 2016-05-27 MED ORDER — ONDANSETRON HCL 4 MG/2ML IJ SOLN
4.0000 mg | Freq: Four times a day (QID) | INTRAMUSCULAR | Status: DC | PRN
  Administered 2016-05-27 (×2): 4 mg via INTRAVENOUS
  Filled 2016-05-27 (×2): qty 2

## 2016-05-27 NOTE — Progress Notes (Signed)
Patient ID: [name], female   DOB: 09-27-1960, 57 y.o.   MRN: [id number]  Rechecked patient this afternoon. Tolerating clamping trial. She denies nausea, vomiting, or abdominal bloating. Continues to pass flatus. Had a BM this morning.  Hooked NG tube back up to LIWS and <50cc was returned.  D/c NG tube and start on clears. Encouraged patient to ambulate.  BROOKE A MILLER

## 2016-05-27 NOTE — Progress Notes (Signed)
PROGRESS NOTE    [name]  GHW:[id number] DOB: 22-Apr-1960 DOA: 05/24/2016 PCP: [name], DO   Chief Complaint  Patient presents with  . Emesis  . Abdominal Pain  . Fatigue    Brief Narrative:  HPI on 05/24/2016 by Dr. Karmen Bongo [name] is a 56 y.o. female with medical history significant of Crohn's disease with prior surgeries and obstruction; kidney stones; HTN; HLD; and GERD presenting with n/v for over a week.  Marked abdominal pain.  Uncertain etiology.  +Crohn's, but this is unlike her usual flares.  Last BM was yesterday, was its usual watery consistency.  No fevers.  She was 5 pounds heavier 2 weeks ago.   Assessment & Plan   Nausea/vomiting/Abdominal pain/ Possible Small Bowel Obstruction -CT abd/pelvis: Fluid-filled loops of small bowel within the central and lower abdomen with scattered areas of mucosal enhancement could relate to enteritis or mild ileus. Cannot exclude partial or developing obstruction. -Patient has had multiple abdominal surgeries in the past; at risk for adhesions/obstruction. -Currently has NG tube in place, and feels nausea/vomiting has improved. Also with multiple BM overnight. -per general surgery will attempt NGT clamping -Recently seen by Dr. Silverio Decamp, GI- had EGD on 05/03/16, which showed residual food in the stomach- ?gastroparesis -Gastroenterology consulted and appreciated; will follow rec's -Continue conservative care, IVF, pain management, antiemetics, NPO, electrolytes repletion  -Will continue supportive care and follow clinical response   Hypokalemia/ hypomagnesemia/hypernatremia -Likely secondary to GI losses and dehydration/poor oral intake -Continue to replace and monitor BMP -will start D5 1/2 NS with K supplementation   Hyperglycemia -?stress response and on steroids -Continue to monitor -No history of diabetes  Essential hypertension -will continue IV hydralazine PRN  Crohn's disease -Currently on  Entyvio every 8 weeks -Sees Dr. Silverio Decamp -GI consulted and appreciated -Continue IV steroids; will follow rec's  DVT Prophylaxis  Lovenox  Code Status: Full  Family Communication: husband at bedside  Disposition Plan: remains inpatient' will follow general surgery rec's and pursuit clamping trial. Will aggressively replete electrolytes, continue IV steroids and follow further rec's from GI.   Consultants Gastroenterology General surgery  Procedures  None  Antibiotics   Anti-infectives    None      Subjective:   [name] seen and examined today.  Feels much better overall. Reports multiple BM's overnight and is currently w/o abd pain or further episodes of vomiting. Afebrile.    Objective:   Vitals:   05/26/16 1700 05/26/16 2132 05/27/16 0543 05/27/16 0945  BP: 140/68 (!) 175/99 137/79   Pulse: 98 (!) 112 (!) 101   Resp:  20 18   Temp:  98 F (36.7 C) 97.9 F (36.6 C)   TempSrc:  Oral Oral   SpO2:  96% 99% 98%  Weight:      Height:        Intake/Output Summary (Last 24 hours) at 05/27/16 1006 Last data filed at 05/26/16 2311  Gross per 24 hour  Intake                0 ml  Output              953 ml  Net             -953 ml   Filed Weights   05/24/16 1148 05/24/16 2244  Weight: 77.1 kg (170 lb) 76.6 kg (168 lb 14 oz)    Exam  General: in no major distress today; feeling slightly better and reporting  multiple BM's overnight. Denies abd pain and vomiting currently.  HEENT: NCAT, NG tube in place. Currently under clamping trial  Cardiovascular: S1 S2 auscultated, no rubs, murmurs or gallops. Regular rate and rhythm.  Respiratory: Clear to auscultation bilaterally with equal chest rise, no tachypnea  Abdomen: Soft, no tender, positive BS  Extremities: warm, dry, without cyanosis, clubbing or edema  Neuro: AAOx3, nonfocal  Psych: Appropriate mood and affect   Data Reviewed: I have personally reviewed following labs and imaging  studies  CBC:  Recent Labs Lab 05/24/16 1215 05/25/16 0419 05/26/16 0419 05/27/16 0403  WBC 11.8* 7.1 5.8 9.8  HGB 15.0 12.8 10.9* 13.9  HCT 43.9 38.8 33.3* 42.3  MCV 87.1 88.0 88.3 89.1  PLT 312 218 227 921   Basic Metabolic Panel:  Recent Labs Lab 05/24/16 1215 05/25/16 0043 05/25/16 0419 05/26/16 0419 05/27/16 0403  Jessica 136  --  140 144 147*  K 3.0*  --  3.4* 3.4* 2.5*  CL 103  --  108 111 113*  CO2 21*  --  21* 24 22  GLUCOSE 113*  --  106* 142* 151*  BUN 16  --  11 16 33*  CREATININE 0.77  --  0.65 0.62 0.97  CALCIUM 9.2  --  8.7* 8.9 10.1  MG  --  1.3*  --   --  2.4   GFR: Estimated Creatinine Clearance: 69.1 mL/min (by C-G formula based on SCr of 0.97 mg/dL). Liver Function Tests:  Recent Labs Lab 05/24/16 1215  AST 19  ALT 17  ALKPHOS 113  BILITOT 0.9  PROT 8.7*  ALBUMIN 4.0    Recent Labs Lab 05/24/16 1215  LIPASE 20   Urine analysis:    Component Value Date/Time   COLORURINE AMBER (A) 05/24/2016 1205   APPEARANCEUR HAZY (A) 05/24/2016 1205   LABSPEC 1.028 05/24/2016 1205   PHURINE 5.0 05/24/2016 1205   GLUCOSEU NEGATIVE 05/24/2016 1205   HGBUR NEGATIVE 05/24/2016 1205   BILIRUBINUR SMALL (A) 05/24/2016 1205   KETONESUR 20 (A) 05/24/2016 1205   PROTEINUR 30 (A) 05/24/2016 1205   UROBILINOGEN 0.2 03/29/2013 0919   NITRITE NEGATIVE 05/24/2016 1205   LEUKOCYTESUR TRACE (A) 05/24/2016 1205    )No results found for this or any previous visit (from the past 240 hour(s)).    Radiology Studies: Dg Abd 2 Views  Result Date: 05/27/2016 CLINICAL DATA:  Small bowel obstruction EXAM: ABDOMEN - 2 VIEW COMPARISON:  05/26/2016 abdominal radiograph FINDINGS: Enteric tube terminates in the body of the stomach. Cholecystectomy clips are seen in the right upper quadrant of the abdomen. Surgical sutures overlie the right mid abdomen. Relatively gasless abdomen. No appreciable dilated small bowel loops. A few scattered small air-fluid levels are seen in  the right abdomen on the decubitus views. No evidence of pneumatosis or pneumoperitoneum. No radiopaque urolithiasis. Mild thoracolumbar spondylosis. IMPRESSION: Relatively gasless abdomen with no appreciable dilated small bowel loops. Nonspecific small air-fluid levels in the right abdomen could be due to ileus or small-bowel obstruction. Enteric tube terminates in the body of the stomach. Electronically Signed   By: Ilona Sorrel M.D.   On: 05/27/2016 09:31   Dg Abd 2 Views  Result Date: 05/26/2016 CLINICAL DATA:  Follow-up small bowel obstruction EXAM: ABDOMEN - 2 VIEW COMPARISON:  05/25/2016 FINDINGS: NG tube tip is in the mid stomach. No small bowel dilatation currently. No free air organomegaly. Prior cholecystectomy. IMPRESSION: No current evidence for bowel obstruction. Electronically Signed   By: Rolm Baptise  M.D.   On: 05/26/2016 11:56   Dg Abd Portable 1v-small Bowel Obstruction Protocol-initial, 8 Hr Delay  Result Date: 05/25/2016 CLINICAL DATA:  56 year old female with small bowel obstruction. 8 hour delayed image. EXAM: PORTABLE ABDOMEN - 1 VIEW COMPARISON:  Abdominal CT dated 05/24/2016 and radiographs dated 05/25/2016 and 05/24/2016 FINDINGS: Oral contrast is seen in the distal small bowel as well as throughout the colon and rectosigmoid. There is mild dilatation of the small bowel in the right lower quadrant measuring 3.5 cm in diameter. Chief an enteric tube is noted within the stomach. No free air identified. Right upper quadrant cholecystectomy clips. IMPRESSION: Mildly dilated small bowel loop in the right lower quadrant measuring 3.5 cm in diameter. There is however passage of contrast into the colon. Electronically Signed   By: Anner Crete M.D.   On: 05/25/2016 22:29   Dg Abd Portable 1v-small Bowel Protocol-position Verification  Result Date: 05/25/2016 CLINICAL DATA:  Nasogastric tube placement. EXAM: PORTABLE ABDOMEN - 1 VIEW COMPARISON:  Radiograph of May 24, 2016.  FINDINGS: The bowel gas pattern is normal. Distal tip of nasogastric tube is seen in expected position of stomach. Status post cholecystectomy. IMPRESSION: Distal tip of nasogastric tube seen in expected position of stomach. Electronically Signed   By: Marijo Conception, M.D.   On: 05/25/2016 13:53     Scheduled Meds: . chlorhexidine  15 mL Mouth Rinse BID  . enoxaparin (LOVENOX) injection  40 mg Subcutaneous QHS  . mouth rinse  15 mL Mouth Rinse q12n4p  . methylPREDNISolone (SOLU-MEDROL) injection  60 mg Intravenous Q12H  . mometasone-formoterol  2 puff Inhalation BID  . pantoprazole (PROTONIX) IV  40 mg Intravenous Daily  . potassium chloride  10 mEq Intravenous Q1 Hr x 6  . potassium chloride  10 mEq Intravenous Q1 Hr x 5  . promethazine  25 mg Intravenous Once   Continuous Infusions: . dextrose 5 % and 0.45% NaCl 1,000 mL with potassium chloride 40 mEq infusion       LOS: 3 days   Time Spent in minutes   30 minutes  Barton Dubois MD. on 05/27/2016 at 10:06 AM  Between 7am to 7pm - Pager - [phone]  After 7pm go to [email or web address] - password [email or web address]  And look for the night coverage person covering for me after hours  [facility] Office  [phone]

## 2016-05-27 NOTE — Progress Notes (Signed)
Dear Doctor: [name] this patient has been identified as a candidate for PICC for the following reason (s): IV therapy over 48 hours, drug pH or osmolality (causing phlebitis, infiltration in 24 hours), drug extravasation potential with tissue necrosis (KCL, Dilantin, Dopamine, CaCl, MgSO4, chemo vesicant), poor veins/poor circulatory system (CHF, COPD, emphysema, diabetes, steroid use, IV drug abuse, etc.) and restarts due to phlebitis and infiltration in 24 hours If you agree, please write an order for the indicated device. For any questions contact the Vascular Access Team at [phone] if no answer, please leave a message.  Thank you for supporting the early vascular access assessment program.

## 2016-05-27 NOTE — Progress Notes (Signed)
Subjective: Her husband said she had a hard early evening, with nausea and vomiting, but slept well, with no further nausea and vomiting later in the PM.  She wants to get rid of the NG and we talked about clamping it.   She is just back from xray and I reconnected her she had less than 100 out.    Objective: Vital signs in last 24 hours: Temp:  [97.9 F (36.6 C)-98.1 F (36.7 C)] 97.9 F (36.6 C) (03/23 0543) Pulse Rate:  [98-112] 101 (03/23 0543) Resp:  [16-20] 18 (03/23 0543) BP: (137-179)/(68-105) 137/79 (03/23 0543) SpO2:  [96 %-99 %] 99 % (03/23 0543) Last BM Date: 05/24/16 Urine x 2 Emesis/NG 950 Stool x 3 Afebrile, BP up K 2.5 Glucose 151 CBC is normal Film is pending Intake/Output from previous day: 03/22 0701 - 03/23 0700 In: -  Out: 955 [Urine:2; Emesis/NG output:950; Stool:3] Intake/Output this shift: No intake/output data recorded.  General appearance: alert, cooperative and no distress GI: soft, non-tender; bowel sounds normal; no masses,  no organomegaly and having multiple BM's.    Lab Results:   Recent Labs  05/26/16 0419 05/27/16 0403  WBC 5.8 9.8  HGB 10.9* 13.9  HCT 33.3* 42.3  PLT 227 323    BMET  Recent Labs  05/26/16 0419 05/27/16 0403  NA 144 147*  K 3.4* 2.5*  CL 111 113*  CO2 24 22  GLUCOSE 142* 151*  BUN 16 33*  CREATININE 0.62 0.97  CALCIUM 8.9 10.1   PT/INR No results for input(s): LABPROT, INR in the last 72 hours.   Recent Labs Lab 05/24/16 1215  AST 19  ALT 17  ALKPHOS 113  BILITOT 0.9  PROT 8.7*  ALBUMIN 4.0     Lipase     Component Value Date/Time   LIPASE 20 05/24/2016 1215     Studies/Results: Dg Abd 2 Views  Result Date: 05/26/2016 CLINICAL DATA:  Follow-up small bowel obstruction EXAM: ABDOMEN - 2 VIEW COMPARISON:  05/25/2016 FINDINGS: NG tube tip is in the mid stomach. No small bowel dilatation currently. No free air organomegaly. Prior cholecystectomy. IMPRESSION: No current evidence for  bowel obstruction. Electronically Signed   By: Rolm Baptise M.D.   On: 05/26/2016 11:56   Dg Abd Portable 1v-small Bowel Obstruction Protocol-initial, 8 Hr Delay  Result Date: 05/25/2016 CLINICAL DATA:  56 year old female with small bowel obstruction. 8 hour delayed image. EXAM: PORTABLE ABDOMEN - 1 VIEW COMPARISON:  Abdominal CT dated 05/24/2016 and radiographs dated 05/25/2016 and 05/24/2016 FINDINGS: Oral contrast is seen in the distal small bowel as well as throughout the colon and rectosigmoid. There is mild dilatation of the small bowel in the right lower quadrant measuring 3.5 cm in diameter. Chief an enteric tube is noted within the stomach. No free air identified. Right upper quadrant cholecystectomy clips. IMPRESSION: Mildly dilated small bowel loop in the right lower quadrant measuring 3.5 cm in diameter. There is however passage of contrast into the colon. Electronically Signed   By: Anner Crete M.D.   On: 05/25/2016 22:29   Dg Abd Portable 1v-small Bowel Protocol-position Verification  Result Date: 05/25/2016 CLINICAL DATA:  Nasogastric tube placement. EXAM: PORTABLE ABDOMEN - 1 VIEW COMPARISON:  Radiograph of May 24, 2016. FINDINGS: The bowel gas pattern is normal. Distal tip of nasogastric tube is seen in expected position of stomach. Status post cholecystectomy. IMPRESSION: Distal tip of nasogastric tube seen in expected position of stomach. Electronically Signed   By: Sabino Dick  Brooke Bonito, M.D.   On: 05/25/2016 13:53    Medications: . chlorhexidine  15 mL Mouth Rinse BID  . enoxaparin (LOVENOX) injection  40 mg Subcutaneous QHS  . mouth rinse  15 mL Mouth Rinse q12n4p  . methylPREDNISolone (SOLU-MEDROL) injection  60 mg Intravenous Q12H  . mometasone-formoterol  2 puff Inhalation BID  . pantoprazole (PROTONIX) IV  40 mg Intravenous Daily  . potassium chloride  10 mEq Intravenous Q1 Hr x 6  . potassium chloride  10 mEq Intravenous Q1 Hr x 3  . promethazine  25 mg Intravenous  Once    Assessment/Plan SBO Crohn's disease on Entyvio Chronic nausea and vomiting Prior ileocecectomy/appendectomy 10/2008 & Right colectomy 09/2010 Chronic pancreatic insuffiencey - chronic diarrhea on Creon Hypokalemia K+ 2.5/ mag 2.4 Dehydration  Gastritis Hx of migraines since 11/2015 Hx of hypertension Hx of hyperlipidemia FEN: NPO/IV fluids ID: No abx DVT: Lovenox    Plan:  Clamping trials and see how she does.  I don't think she is obstructed.      LOS: 3 days    [name] 05/27/2016 [phone]

## 2016-05-27 NOTE — Progress Notes (Signed)
[facility] Progress Note  Subjective:  Had a rough day yesterday but then was able to rest and sleep last night.  Still passing flatus and having BM's.  NGT just clamped by surgery again this AM.  X-ray this AM still suggests ileus vs SBO in RLQ.  Objective:  Vital signs in last 24 hours: Temp:  [97.9 F (36.6 C)-98.1 F (36.7 C)] 97.9 F (36.6 C) (03/23 0543) Pulse Rate:  [98-112] 101 (03/23 0543) Resp:  [16-20] 18 (03/23 0543) BP: (137-179)/(68-105) 137/79 (03/23 0543) SpO2:  [96 %-99 %] 99 % (03/23 0543) Last BM Date: 05/24/16 General:  Alert, Well-developed, in NAD Heart:  Regular rate and rhythm; no murmurs Pulm:  CTAB.  No increased WOB. Abdomen:  Soft, non-distended.  BS very quiet and sparse.  Lower abdominal TTP. Extremities:  Without edema. Neurologic:  Alert and oriented x 4;  grossly normal neurologically. Psych:  Alert and cooperative. Normal mood and affect.  Intake/Output from previous day: 03/22 0701 - 03/23 0700 In: -  Out: 955 [Urine:2; Emesis/NG output:950; Stool:3]  Lab Results:  Recent Labs  05/25/16 0419 05/26/16 0419 05/27/16 0403  WBC 7.1 5.8 9.8  HGB 12.8 10.9* 13.9  HCT 38.8 33.3* 42.3  PLT 218 227 323   BMET  Recent Labs  05/25/16 0419 05/26/16 0419 05/27/16 0403  NA 140 144 147*  K 3.4* 3.4* 2.5*  CL 108 111 113*  CO2 21* 24 22  GLUCOSE 106* 142* 151*  BUN 11 16 33*  CREATININE 0.65 0.62 0.97  CALCIUM 8.7* 8.9 10.1   LFT  Recent Labs  05/24/16 1215  PROT 8.7*  ALBUMIN 4.0  AST 19  ALT 17  ALKPHOS 113  BILITOT 0.9   Dg Abd 2 Views  Result Date: 05/26/2016 CLINICAL DATA:  Follow-up small bowel obstruction EXAM: ABDOMEN - 2 VIEW COMPARISON:  05/25/2016 FINDINGS: NG tube tip is in the mid stomach. No small bowel dilatation currently. No free air organomegaly. Prior cholecystectomy. IMPRESSION: No current evidence for bowel obstruction. Electronically Signed   By: Rolm Baptise M.D.   On: 05/26/2016  11:56   Dg Abd Portable 1v-small Bowel Obstruction Protocol-initial, 8 Hr Delay  Result Date: 05/25/2016 CLINICAL DATA:  56 year old female with small bowel obstruction. 8 hour delayed image. EXAM: PORTABLE ABDOMEN - 1 VIEW COMPARISON:  Abdominal CT dated 05/24/2016 and radiographs dated 05/25/2016 and 05/24/2016 FINDINGS: Oral contrast is seen in the distal small bowel as well as throughout the colon and rectosigmoid. There is mild dilatation of the small bowel in the right lower quadrant measuring 3.5 cm in diameter. Chief an enteric tube is noted within the stomach. No free air identified. Right upper quadrant cholecystectomy clips. IMPRESSION: Mildly dilated small bowel loop in the right lower quadrant measuring 3.5 cm in diameter. There is however passage of contrast into the colon. Electronically Signed   By: Anner Crete M.D.   On: 05/25/2016 22:29   Dg Abd Portable 1v-small Bowel Protocol-position Verification  Result Date: 05/25/2016 CLINICAL DATA:  Nasogastric tube placement. EXAM: PORTABLE ABDOMEN - 1 VIEW COMPARISON:  Radiograph of May 24, 2016. FINDINGS: The bowel gas pattern is normal. Distal tip of nasogastric tube is seen in expected position of stomach. Status post cholecystectomy. IMPRESSION: Distal tip of nasogastric tube seen in expected position of stomach. Electronically Signed   By: Marijo Conception, M.D.   On: 05/25/2016 13:53   Assessment / Plan: 89. 56 year old female with small and large bowel Crohn's  disease s/pterminal ileal resection in 2008 and colon resection in 2012. Maintained on Entyvio Q8 weeks.Here with N/V/abdominal pain and inability to take PO for the past week. CT scan suggesting at least partial SBO. ? Mechanical from adhesions or secondary to Crohn's disease (some active disease, but ? Stricture). Surgery is following.  Abdominal film this AM still suggesting SBO.  NGT clamped for trial.  2. Hypokalemia: Potassium 2.8 this AM.  3. Pancreatic  insufficiency.Will resume Creon when able to take PO.  -Will follow with surgery. -Correct electrolytes, try to keep K+ above 4. -Continue NGT clamped for trial. -Ok to continue IV steroids for now.   LOS: 3 days   ZEHR, JESSICA D.  05/27/2016, 9:28 AM  Pager number [phone]  GI ATTENDING  Interval history data reviewed. Patient personally seen and examined. Agree with interval progress note. Daughter in room. Doing better today. Has NG tube clamped. Did have a bowel movement. X-ray with possible focal dilated loop of small bowel. Continue to see how she tolerates NG tube clamping. Increase activity. Important to correct hypokalemia (per primary service). We'll continue to follow along with surgery.  Docia Chuck. Geri Seminole., M.D. [facility] Division of Gastroenterology

## 2016-05-28 LAB — PHOSPHORUS: Phosphorus: 2.4 mg/dL — ABNORMAL LOW (ref 2.5–4.6)

## 2016-05-28 LAB — MAGNESIUM: Magnesium: 2 mg/dL (ref 1.7–2.4)

## 2016-05-28 LAB — BASIC METABOLIC PANEL
Anion gap: 6 (ref 5–15)
BUN: 29 mg/dL — AB (ref 6–20)
CHLORIDE: 113 mmol/L — AB (ref 101–111)
CO2: 21 mmol/L — AB (ref 22–32)
CREATININE: 0.77 mg/dL (ref 0.44–1.00)
Calcium: 9 mg/dL (ref 8.9–10.3)
GFR calc Af Amer: >60 mL/min (ref >60)
GFR calc non Af Amer: >60 mL/min (ref >60)
GLUCOSE: 153 mg/dL — AB (ref 65–99)
Potassium: 2.9 mmol/L — ABNORMAL LOW (ref 3.5–5.1)
Sodium: 140 mmol/L (ref 135–145)

## 2016-05-28 MED ORDER — POTASSIUM CHLORIDE CRYS ER 20 MEQ PO TBCR
40.0000 meq | EXTENDED_RELEASE_TABLET | ORAL | Status: AC
  Administered 2016-05-28: 40 meq via ORAL
  Filled 2016-05-28: qty 2

## 2016-05-28 MED ORDER — PROMETHAZINE HCL 25 MG/ML IJ SOLN
25.0000 mg | Freq: Four times a day (QID) | INTRAMUSCULAR | Status: DC | PRN
  Administered 2016-05-28 – 2016-05-29 (×2): 25 mg via INTRAMUSCULAR
  Filled 2016-05-28 (×2): qty 1

## 2016-05-28 NOTE — Progress Notes (Signed)
[facility] Progress Note  Subjective:  Feeling better.  NGT clamped yesterday AM and then out yesterday afternoon.  Tolerating small amounts of clear liquids but does feel a little nauseous this AM.  Passing flatus and having BM's.  No abdominal pain at present.  Objective:  Vital signs in last 24 hours: Temp:  [98 F (36.7 C)-98.8 F (37.1 C)] 98 F (36.7 C) (03/24 0519) Pulse Rate:  [88-102] 88 (03/24 0519) Resp:  [18-20] 18 (03/24 0519) BP: (150-174)/(85-108) 150/100 (03/24 0520) SpO2:  [95 %-98 %] 97 % (03/24 0742) Last BM Date: 05/28/16 General:  Alert, Well-developed, in NAD Heart:  Regular rate and rhythm; no murmurs Pulm:  CTAB.  No increased WOB. Abdomen:  Soft, non-distended.  BS present.  Non-tender.  Extremities:  Without edema. Neurologic:  Alert and oriented x 4;  grossly normal neurologically. Psych:  Alert and cooperative. Normal mood and affect.  Intake/Output from previous day: 03/23 0701 - 03/24 0700 In: 882.5 [I.V.:882.5] Out: 3 [Urine:2; Stool:1]  Lab Results:  Recent Labs  05/26/16 0419 05/27/16 0403  WBC 5.8 9.8  HGB 10.9* 13.9  HCT 33.3* 42.3  PLT 227 323   BMET  Recent Labs  05/26/16 0419 05/27/16 0403 05/28/16 0338  NA 144 147* 140  K 3.4* 2.5* 2.9*  CL 111 113* 113*  CO2 24 22 21*  GLUCOSE 142* 151* 153*  BUN 16 33* 29*  CREATININE 0.62 0.97 0.77  CALCIUM 8.9 10.1 9.0   Dg Abd 2 Views  Result Date: 05/27/2016 CLINICAL DATA:  Small bowel obstruction EXAM: ABDOMEN - 2 VIEW COMPARISON:  05/26/2016 abdominal radiograph FINDINGS: Enteric tube terminates in the body of the stomach. Cholecystectomy clips are seen in the right upper quadrant of the abdomen. Surgical sutures overlie the right mid abdomen. Relatively gasless abdomen. No appreciable dilated small bowel loops. A few scattered small air-fluid levels are seen in the right abdomen on the decubitus views. No evidence of pneumatosis or pneumoperitoneum. No  radiopaque urolithiasis. Mild thoracolumbar spondylosis. IMPRESSION: Relatively gasless abdomen with no appreciable dilated small bowel loops. Nonspecific small air-fluid levels in the right abdomen could be due to ileus or small-bowel obstruction. Enteric tube terminates in the body of the stomach. Electronically Signed   By: Ilona Sorrel M.D.   On: 05/27/2016 09:31   Dg Abd 2 Views  Result Date: 05/26/2016 CLINICAL DATA:  Follow-up small bowel obstruction EXAM: ABDOMEN - 2 VIEW COMPARISON:  05/25/2016 FINDINGS: NG tube tip is in the mid stomach. No small bowel dilatation currently. No free air organomegaly. Prior cholecystectomy. IMPRESSION: No current evidence for bowel obstruction. Electronically Signed   By: Rolm Baptise M.D.   On: 05/26/2016 11:56   Assessment / Plan: 44. 56 year old female with small and large bowel Crohn's disease s/pterminal ileal resection in 2008 and colon resection in 2012. Maintained on Entyvio Q8 weeks.Here with N/V/abdominal pain and inability to take PO for the past week. CT scan suggesting at least partial SBO. ? Mechanical from adhesionsor secondary to Crohn's disease (some active disease, but ? Stricture). Surgery is following.  Improving clinically.  Would leave her on clear liquids for today.  2. Hypokalemia: Potassium 2.9 this AM.  3. Pancreatic insufficiency.Will resume Creon when able to take more PO.  -Will follow with surgery. -Correct electrolytes, try to keep K+ above 4. -Continue clear liquids for today -? How long to continue IV steroids. -Encouraged ambulation.   LOS: 4 days   ZEHR, JESSICA D.  05/28/2016, 8:58 AM Pager number [phone]  GI ATTENDING  Interval history data reviewed. Agree with interval progress note. We will see how she does with NG tube out and gradual advancement of diet. Agree with increasing activity. Primary service to address hypokalemia. This is important. Continue IV steroids for now. When taking by mouth,  would give a short course of steroids with relatively quick taper.  Docia Chuck. Geri Seminole., M.D. [facility] Division of Gastroenterology

## 2016-05-28 NOTE — Progress Notes (Signed)
PROGRESS NOTE    [name]  IOM:[id number] DOB: [birth year] DOA: 05/24/2016 PCP: [name], DO   Chief Complaint  Patient presents with  . Emesis  . Abdominal Pain  . Fatigue    Brief Narrative:  HPI on 05/24/2016 by Dr. Karmen Bongo [name] is a 56 y.o. female with medical history significant of Crohn's disease with prior surgeries and obstruction; kidney stones; HTN; HLD; and GERD presenting with n/v for over a week.  Marked abdominal pain.  Uncertain etiology.  +Crohn's, but this is unlike her usual flares.  Last BM was yesterday, was its usual watery consistency.  No fevers.  She was 5 pounds heavier 2 weeks ago.   Assessment & Plan   Nausea/vomiting/Abdominal pain/ Possible Small Bowel Obstruction -CT abd/pelvis: Fluid-filled loops of small bowel within the central and lower abdomen with scattered areas of mucosal enhancement could relate to enteritis or mild ileus. Cannot exclude partial or developing obstruction. -Patient has had multiple abdominal surgeries in the past; at risk for adhesions/obstruction. -Currently has NG tube in place, and feels nausea/vomiting has improved. Also with multiple BM overnight. -NGT removed and will follow further surgery rec's/ patient continue to move BM's -Recently seen by Dr. Silverio Decamp, GI- had EGD on 05/03/16, which showed residual food in the stomach- ?gastroparesis -Gastroenterology consulted and appreciated; will follow rec's -Continue conservative care, IVF, pain management, antiemetics, electrolytes repletion  -CLD currently -Will continue supportive care and follow clinical response   Hypokalemia/ hypomagnesemia/hypernatremia -Likely secondary to GI losses and dehydration/poor oral intake -Continue to replace and monitor BMP -will continue D5 1/2 NS with K supplementation  -continue potassium repletion; K 2.9 today  Hyperglycemia -?stress response and on steroids -Continue to monitor -No history of  diabetes  Essential hypertension -will continue IV hydralazine PRN  Crohn's disease -Currently on Entyvio every 8 weeks -Sees Dr. Silverio Decamp -GI consulted and appreciated -Continue IV steroids; will follow rec's  DVT Prophylaxis  Lovenox  Code Status: Full  Family Communication: husband at bedside  Disposition Plan: remains inpatient will follow general surgery rec's and continue CLD/advancing diet slowly. Will aggressively replete electrolytes, continue IV steroids and follow further rec's from GI.   Consultants Gastroenterology General surgery  Procedures  None  Antibiotics   Anti-infectives    None      Subjective:   [name] seen and examined today.  Feels much better overall. Reports intermittent nausea, but no further vomiting. Still moving her bowels and tolerating CLD.   Afebrile.    Objective:   Vitals:   05/27/16 2057 05/28/16 0519 05/28/16 0520 05/28/16 0742  BP: (!) 151/103 (!) 173/108 (!) 150/100   Pulse: (!) 101 88    Resp: 18 18    Temp: 98.5 F (36.9 C) 98 F (36.7 C)    TempSrc: Oral Oral    SpO2: 95% 97%  97%  Weight:      Height:        Intake/Output Summary (Last 24 hours) at 05/28/16 1400 Last data filed at 05/27/16 2300  Gross per 24 hour  Intake            882.5 ml  Output                2 ml  Net            880.5 ml   Filed Weights   05/24/16 1148 05/24/16 2244  Weight: 77.1 kg (170 lb) 76.6 kg (168 lb 14 oz)  Exam  General: NAD; reports mild intermittent nausea; no further vomiting and continue moving her bowels. Tolerating CLD. Patient with NGT off since 3/23 evening.   HEENT: NCAT, no exudates, no thrush  Cardiovascular: S1 S2 auscultated, no rubs, murmurs or gallops. Regular rate and rhythm.  Respiratory: Clear to auscultation bilaterally with equal chest rise, no tachypnea  Abdomen: Soft, no tender, positive BS  Extremities: warm, dry, without cyanosis, clubbing or edema  Neuro: AAOx3,  nonfocal  Psych: Appropriate mood and affect   Data Reviewed: I have personally reviewed following labs and imaging studies  CBC:  Recent Labs Lab 05/24/16 1215 05/25/16 0419 05/26/16 0419 05/27/16 0403  WBC 11.8* 7.1 5.8 9.8  HGB 15.0 12.8 10.9* 13.9  HCT 43.9 38.8 33.3* 42.3  MCV 87.1 88.0 88.3 89.1  PLT 312 218 227 592   Basic Metabolic Panel:  Recent Labs Lab 05/24/16 1215 05/25/16 0043 05/25/16 0419 05/26/16 0419 05/27/16 0403 05/28/16 0338  NA 136  --  140 144 147* 140  K 3.0*  --  3.4* 3.4* 2.5* 2.9*  CL 103  --  108 111 113* 113*  CO2 21*  --  21* 24 22 21*  GLUCOSE 113*  --  106* 142* 151* 153*  BUN 16  --  11 16 33* 29*  CREATININE 0.77  --  0.65 0.62 0.97 0.77  CALCIUM 9.2  --  8.7* 8.9 10.1 9.0  MG  --  1.3*  --   --  2.4 2.0  PHOS  --   --   --   --   --  2.4*   GFR: Estimated Creatinine Clearance: 83.8 mL/min (by C-G formula based on SCr of 0.77 mg/dL).   Liver Function Tests:  Recent Labs Lab 05/24/16 1215  AST 19  ALT 17  ALKPHOS 113  BILITOT 0.9  PROT 8.7*  ALBUMIN 4.0    Recent Labs Lab 05/24/16 1215  LIPASE 20   Urine analysis:    Component Value Date/Time   COLORURINE AMBER (A) 05/24/2016 1205   APPEARANCEUR HAZY (A) 05/24/2016 1205   LABSPEC 1.028 05/24/2016 1205   PHURINE 5.0 05/24/2016 1205   GLUCOSEU NEGATIVE 05/24/2016 1205   HGBUR NEGATIVE 05/24/2016 1205   BILIRUBINUR SMALL (A) 05/24/2016 1205   KETONESUR 20 (A) 05/24/2016 1205   PROTEINUR 30 (A) 05/24/2016 1205   UROBILINOGEN 0.2 03/29/2013 0919   NITRITE NEGATIVE 05/24/2016 1205   LEUKOCYTESUR TRACE (A) 05/24/2016 1205    )No results found for this or any previous visit (from the past 240 hour(s)).    Radiology Studies: Dg Abd 2 Views  Result Date: 05/27/2016 CLINICAL DATA:  Small bowel obstruction EXAM: ABDOMEN - 2 VIEW COMPARISON:  05/26/2016 abdominal radiograph FINDINGS: Enteric tube terminates in the body of the stomach. Cholecystectomy clips are  seen in the right upper quadrant of the abdomen. Surgical sutures overlie the right mid abdomen. Relatively gasless abdomen. No appreciable dilated small bowel loops. A few scattered small air-fluid levels are seen in the right abdomen on the decubitus views. No evidence of pneumatosis or pneumoperitoneum. No radiopaque urolithiasis. Mild thoracolumbar spondylosis. IMPRESSION: Relatively gasless abdomen with no appreciable dilated small bowel loops. Nonspecific small air-fluid levels in the right abdomen could be due to ileus or small-bowel obstruction. Enteric tube terminates in the body of the stomach. Electronically Signed   By: Ilona Sorrel M.D.   On: 05/27/2016 09:31    Scheduled Meds: . chlorhexidine  15 mL Mouth Rinse BID  . enoxaparin (  LOVENOX) injection  40 mg Subcutaneous QHS  . mouth rinse  15 mL Mouth Rinse q12n4p  . methylPREDNISolone (SOLU-MEDROL) injection  60 mg Intravenous Q12H  . mometasone-formoterol  2 puff Inhalation BID  . pantoprazole (PROTONIX) IV  40 mg Intravenous Daily  . potassium chloride  40 mEq Oral Q4H  . promethazine  25 mg Intravenous Once   Continuous Infusions: . dextrose 5 % and 0.45% NaCl 1,000 mL with potassium chloride 40 mEq infusion 75 mL/hr at 05/28/16 0146     LOS: 4 days   Time Spent in minutes   25 minutes  Barton Dubois MD. on 05/28/2016 at 2:00 PM  Between 7am to 7pm - Pager - [phone]  After 7pm go to [email or web address] - password [email or web address]  And look for the night coverage person covering for me after hours  [facility] Office  [phone]

## 2016-05-28 NOTE — Progress Notes (Signed)
General Surgery [facility]  Assessment & Plan:  SBO  Crohn's disease on Entyvio  Chronic nausea and vomiting  Prior ileocecectomy/appendectomy 10/2008 & Right colectomy 09/2010 - Dr. Serita Grammes  Chronic pancreatic insuffiencey - chronic diarrhea on Creon  Taking limited clear liquids this AM  Encouraged OOB, ambulation  Will follow Hypokalemia  Medical service repleting        Earnstine Regal, MD, [facility]       Office: [phone]    Subjective: Patient in bed, family at bedside.  Some nausea this AM, no emesis.  Having liquid/soft BM's this AM x 4.  Objective: Vital signs in last 24 hours: Temp:  [98 F (36.7 C)-98.8 F (37.1 C)] 98 F (36.7 C) (03/24 0519) Pulse Rate:  [88-102] 88 (03/24 0519) Resp:  [18-20] 18 (03/24 0519) BP: (150-174)/(85-108) 150/100 (03/24 0520) SpO2:  [95 %-98 %] 97 % (03/24 0742) Last BM Date: 05/28/16  Intake/Output from previous day: 03/23 0701 - 03/24 0700 In: 882.5 [I.V.:882.5] Out: 3 [Urine:2; Stool:1] Intake/Output this shift: No intake/output data recorded.  Physical Exam: HEENT - sclerae clear, mucous membranes moist Abdomen - soft without distension; BS present; minimal tenderness Ext - no edema, non-tender Neuro - alert & oriented, no focal deficits  Lab Results:   Recent Labs  05/26/16 0419 05/27/16 0403  WBC 5.8 9.8  HGB 10.9* 13.9  HCT 33.3* 42.3  PLT 227 323   BMET  Recent Labs  05/27/16 0403 05/28/16 0338  NA 147* 140  K 2.5* 2.9*  CL 113* 113*  CO2 22 21*  GLUCOSE 151* 153*  BUN 33* 29*  CREATININE 0.97 0.77  CALCIUM 10.1 9.0   PT/INR No results for input(s): LABPROT, INR in the last 72 hours. Comprehensive Metabolic Panel:    Component Value Date/Time   NA 140 05/28/2016 0338   NA 147 (H) 05/27/2016 0403   K 2.9 (L) 05/28/2016 0338   K 2.5 (LL) 05/27/2016 0403   CL 113 (H) 05/28/2016 0338   CL 113 (H) 05/27/2016 0403   CO2 21 (L)  05/28/2016 0338   CO2 22 05/27/2016 0403   BUN 29 (H) 05/28/2016 0338   BUN 33 (H) 05/27/2016 0403   CREATININE 0.77 05/28/2016 0338   CREATININE 0.97 05/27/2016 0403   GLUCOSE 153 (H) 05/28/2016 0338   GLUCOSE 151 (H) 05/27/2016 0403   CALCIUM 9.0 05/28/2016 0338   CALCIUM 10.1 05/27/2016 0403   AST 19 05/24/2016 1215   AST 24 02/20/2016 1553   ALT 17 05/24/2016 1215   ALT 29 02/20/2016 1553   ALKPHOS 113 05/24/2016 1215   ALKPHOS 107 02/20/2016 1553   BILITOT 0.9 05/24/2016 1215   BILITOT 0.9 02/20/2016 1553   PROT 8.7 (H) 05/24/2016 1215   PROT 7.3 02/20/2016 1553   ALBUMIN 4.0 05/24/2016 1215   ALBUMIN 3.7 02/20/2016 1553    Studies/Results: Dg Abd 2 Views  Result Date: 05/27/2016 CLINICAL DATA:  Small bowel obstruction EXAM: ABDOMEN - 2 VIEW COMPARISON:  05/26/2016 abdominal radiograph FINDINGS: Enteric tube terminates in the body of the stomach. Cholecystectomy clips are seen in the right upper quadrant of the abdomen. Surgical sutures overlie the right mid abdomen. Relatively gasless abdomen. No appreciable dilated small bowel loops. A few scattered small air-fluid levels are seen in the right abdomen on the decubitus views. No evidence of pneumatosis or pneumoperitoneum. No radiopaque urolithiasis. Mild thoracolumbar spondylosis. IMPRESSION: Relatively gasless abdomen  with no appreciable dilated small bowel loops. Nonspecific small air-fluid levels in the right abdomen could be due to ileus or small-bowel obstruction. Enteric tube terminates in the body of the stomach. Electronically Signed   By: Ilona Sorrel M.D.   On: 05/27/2016 09:31   Dg Abd 2 Views  Result Date: 05/26/2016 CLINICAL DATA:  Follow-up small bowel obstruction EXAM: ABDOMEN - 2 VIEW COMPARISON:  05/25/2016 FINDINGS: NG tube tip is in the mid stomach. No small bowel dilatation currently. No free air organomegaly. Prior cholecystectomy. IMPRESSION: No current evidence for bowel obstruction. Electronically Signed    By: Rolm Baptise M.D.   On: 05/26/2016 11:56      Anara Cowman M 05/28/2016  Patient ID: [name], female   DOB: [birth year], 55 y.o.   MRN: [id number]

## 2016-05-29 ENCOUNTER — Inpatient Hospital Stay (HOSPITAL_COMMUNITY): Payer: BLUE CROSS/BLUE SHIELD

## 2016-05-29 DIAGNOSIS — R1030 Lower abdominal pain, unspecified: Secondary | ICD-10-CM

## 2016-05-29 DIAGNOSIS — K567 Ileus, unspecified: Secondary | ICD-10-CM

## 2016-05-29 DIAGNOSIS — K56609 Unspecified intestinal obstruction, unspecified as to partial versus complete obstruction: Secondary | ICD-10-CM

## 2016-05-29 LAB — BASIC METABOLIC PANEL
ANION GAP: 6 (ref 5–15)
BUN: 16 mg/dL (ref 6–20)
CALCIUM: 9 mg/dL (ref 8.9–10.3)
CO2: 22 mmol/L (ref 22–32)
CREATININE: 0.77 mg/dL (ref 0.44–1.00)
Chloride: 111 mmol/L (ref 101–111)
Glucose, Bld: 157 mg/dL — ABNORMAL HIGH (ref 65–99)
Potassium: 3.8 mmol/L (ref 3.5–5.1)
Sodium: 139 mmol/L (ref 135–145)

## 2016-05-29 MED ORDER — SODIUM CHLORIDE 0.9% FLUSH: 10.0000 mL | INTRAVENOUS | Status: DC | PRN

## 2016-05-29 MED ORDER — MORPHINE SULFATE (PF) 4 MG/ML IV SOLN: 2.0000 mg | INTRAVENOUS | Status: DC | PRN

## 2016-05-29 MED ORDER — HYDROMORPHONE HCL 1 MG/ML IJ SOLN
0.5000 mg | INTRAMUSCULAR | Status: DC | PRN
  Administered 2016-05-29 (×3): 0.5 mg via INTRAVENOUS
  Administered 2016-05-29: 1 mg via INTRAVENOUS
  Administered 2016-05-30 – 2016-05-31 (×8): 0.5 mg via INTRAVENOUS
  Administered 2016-06-01: 1 mg via INTRAVENOUS
  Administered 2016-06-01: 0.5 mg via INTRAVENOUS
  Filled 2016-05-29 (×11): qty 0.5
  Filled 2016-05-29 (×2): qty 1
  Filled 2016-05-29: qty 0.5

## 2016-05-29 MED ORDER — SODIUM CHLORIDE 0.9% FLUSH
10.0000 mL | Freq: Two times a day (BID) | INTRAVENOUS | Status: DC
Start: 2016-05-29 — End: 2016-06-02
  Administered 2016-05-29 – 2016-06-01 (×5): 10 mL
  Administered 2016-06-01: 20 mL

## 2016-05-29 MED ORDER — PANCRELIPASE (LIP-PROT-AMYL) 12000-38000 UNITS PO CPEP: 36000.0000 [IU] | ORAL_CAPSULE | Freq: Three times a day (TID) | ORAL | Status: DC

## 2016-05-29 MED ORDER — POTASSIUM CHLORIDE CRYS ER 20 MEQ PO TBCR
30.0000 meq | EXTENDED_RELEASE_TABLET | Freq: Every day | ORAL | Status: DC
  Filled 2016-05-29: qty 1

## 2016-05-29 MED ORDER — PROMETHAZINE HCL 25 MG/ML IJ SOLN
25.0000 mg | Freq: Four times a day (QID) | INTRAMUSCULAR | Status: DC | PRN
  Administered 2016-05-29 – 2016-06-01 (×10): 25 mg via INTRAVENOUS
  Filled 2016-05-29 (×10): qty 1

## 2016-05-29 NOTE — Progress Notes (Signed)
[facility] Progress Note  Subjective:  Did ok yesterday but then had a rough night with dry heaves and small amount of vomiting.  Still having BM's, actually large amounts per her husband.  Does not want NGT back.  Is asking for exploratory surgery.    Objective:  Vital signs in last 24 hours: Temp:  [98.3 F (36.8 C)-99 F (37.2 C)] 98.7 F (37.1 C) (03/25 0411) Pulse Rate:  [86-96] 86 (03/25 0411) Resp:  [13-16] 14 (03/25 0411) BP: (136-192)/(91-99) 174/91 (03/25 0539) SpO2:  [95 %-98 %] 97 % (03/25 0411) Last BM Date: 05/28/16 General:  Alert, Well-developed, in NAD; appears miserable Heart:  Regular rate and rhythm; no murmurs Pulm:  CTAB.  No increased WOB.  Abdomen:  Soft, non-distended.  BS present.  Non-tender. Extremities:  Without edema. Neurologic:  Alert and oriented x 4;  grossly normal neurologically. Psych:  Alert and cooperative. Normal mood and affect.  Intake/Output from previous day: 03/24 0701 - 03/25 0700 In: 360 [P.O.:360] Out: -    Lab Results:  Recent Labs  05/27/16 0403  WBC 9.8  HGB 13.9  HCT 42.3  PLT 323   BMET  Recent Labs  05/27/16 0403 05/28/16 0338 05/29/16 0334  NA 147* 140 139  K 2.5* 2.9* 3.8  CL 113* 113* 111  CO2 22 21* 22  GLUCOSE 151* 153* 157*  BUN 33* 29* 16  CREATININE 0.97 0.77 0.77  CALCIUM 10.1 9.0 9.0   Dg Abd 2 Views  Result Date: 05/27/2016 CLINICAL DATA:  Small bowel obstruction EXAM: ABDOMEN - 2 VIEW COMPARISON:  05/26/2016 abdominal radiograph FINDINGS: Enteric tube terminates in the body of the stomach. Cholecystectomy clips are seen in the right upper quadrant of the abdomen. Surgical sutures overlie the right mid abdomen. Relatively gasless abdomen. No appreciable dilated small bowel loops. A few scattered small air-fluid levels are seen in the right abdomen on the decubitus views. No evidence of pneumatosis or pneumoperitoneum. No radiopaque urolithiasis. Mild thoracolumbar  spondylosis. IMPRESSION: Relatively gasless abdomen with no appreciable dilated small bowel loops. Nonspecific small air-fluid levels in the right abdomen could be due to ileus or small-bowel obstruction. Enteric tube terminates in the body of the stomach. Electronically Signed   By: Ilona Sorrel M.D.   On: 05/27/2016 09:31   Assessment / Plan: 24. 56 year old female with small and large bowel Crohn's disease s/pterminal ileal resection in 2008 and colon resection in 2012. Maintained on Entyvio Q8 weeks.Here with N/V/abdominal pain and inability to take PO for the past week. CT scan suggesting at least partial SBO. ? Mechanical from adhesionsor secondary to Crohn's disease (some active disease, but ? Stricture). Surgery is following.  Feeling worse again today.    2. Hypokalemia: Potassium 3.8 this AM.  3. Pancreatic insufficiency.Will resume Creon when able to take more PO.  -Will follow with surgery.  Patient is asking for exploratory surgery. -Correct electrolytes, try to keep K+ above 4. -? How long to continue IV steroids. -Encouraged ambulation. -Continue antiemetics.   -Try to limit pain medication/narcotics as much as possible.   LOS: 5 days   ZEHR, JESSICA D.  05/29/2016, 9:13 AM  Pager number [phone]  GI ATTENDING  Interval history data reviewed. Patient personally seen and examined. Multiple family members in room. Problems with recurrent abdominal discomfort and vomiting since 1 AM. X-ray this morning shows air-fluid levels without small bowel dilation. Potassium improved. Switched from morphine to Dilaudid. Remains on and tibia and steroids  for Crohn's disease. Differential diagnosis remains partial mechanical process versus dysmotility versus combination. For Crohn's disease, continue current medications. For possible motility problems recommend keeping potassium above 4.0, increase activity, liquid diet as tolerated, and DECREASE NARCOTIC USE. I told them. Continue  to follow x-rays. If suspicion of mechanical process, could consider small bowel series or formal CT enterography. We'll follow.  Docia Chuck. Geri Seminole., M.D. [facility] Division of Gastroenterology

## 2016-05-29 NOTE — Progress Notes (Signed)
Peripherally Inserted Central Catheter/Midline Placement  The IV Nurse has discussed with the patient and/or persons authorized to consent for the patient, the purpose of this procedure and the potential benefits and risks involved with this procedure.  The benefits include less needle sticks, lab draws from the catheter, and the patient may be discharged home with the catheter. Risks include, but not limited to, infection, bleeding, blood clot (thrombus formation), and puncture of an artery; nerve damage and irregular heartbeat and possibility to perform a PICC exchange if needed/ordered by physician.  Alternatives to this procedure were also discussed.  Bard Power PICC patient education guide, fact sheet on infection prevention and patient information card has been provided to patient /or left at bedside.    PICC/Midline Placement Documentation        Brighid Koch, Nicolette Bang 05/29/2016, 12:54 PM

## 2016-05-29 NOTE — Progress Notes (Signed)
PROGRESS NOTE    [name]  HKV:[id number] DOB: Feb 04, 1961 DOA: 05/24/2016 PCP: [name], DO   Chief Complaint  Patient presents with  . Emesis  . Abdominal Pain  . Fatigue    Brief Narrative:  HPI on 05/24/2016 by Dr. Karmen Bongo LEDORA [name] is a 56 y.o. female with medical history significant of Crohn's disease with prior surgeries and obstruction; kidney stones; HTN; HLD; and GERD presenting with n/v for over a week.  Marked abdominal pain.  Uncertain etiology.  +Crohn's, but this is unlike her usual flares.  Last BM was yesterday, was its usual watery consistency.  No fevers.    Assessment & Plan   Nausea/vomiting/Abdominal pain/ Possible Small Bowel Obstruction -CT abd/pelvis: Fluid-filled loops of small bowel within the central and lower abdomen with scattered areas of mucosal enhancement could relate to enteritis or mild ileus. -Patient has had multiple abdominal surgeries in the past; at risk for adhesions/obstruction. -NGT removed and will follow further surgery rec's/ patient continue to move BM's; but since 3/24 night has had worsening abd pain, nausea and vomiting. -Recently seen by Dr. Silverio Decamp, GI- had EGD on 05/03/16, which showed residual food in the stomach- ?gastroparesis or other dysmotility disorder. -Gastroenterology consulted and inputs appreciated; will follow rec's -Continue conservative care, IVF, pain management, antiemetics, electrolytes repletion and IV steroids. -CLD currently, but barely tolerating sips -Will repeat abd x-ray today (05/29/16)  Hypokalemia/ hypomagnesemia/hypernatremia -Likely secondary to GI losses and dehydration/poor oral intake -Continue to replace and monitor BMP -will continue D5 1/2 NS with K supplementation  -continue potassium repletion; K 3.8 today -Na 139 now (down from 147)  Hyperglycemia -?stress response, D5 infusion and use of steroids -Continue to monitor -No history of diabetes  Essential  hypertension -will continue IV hydralazine PRN  Crohn's disease -Currently on Entyvio every 8 weeks -Sees Dr. Silverio Decamp -GI consulted and appreciated -Continue IV steroids; will follow rec's  PICC line placement -difficulty assessing veins and in need of multiple blood draws and infusion of medications with increase risk for vein irritation.   DVT Prophylaxis  Lovenox  Code Status: Full  Family Communication: husband at bedside  Disposition Plan: remains inpatient will follow general surgery rec's and continue CLD/advancing diet slowly. Will continue repleting electrolytes, continue IV steroids and follow further rec's from GI.   Consultants Gastroenterology General surgery  Procedures  None  Antibiotics   Anti-infectives    None      Subjective:   [name] seen and examined today.  Feels sick. Patient with rough night, constant nausea and multiple episodes of vomiting. Barely keeping sips of CLD down. Afebrile and has continue to have BM's.     Objective:   Vitals:   05/28/16 2015 05/28/16 2123 05/29/16 0411 05/29/16 0539  BP:  (!) 136/91 (!) 192/94 (!) 174/91  Pulse:  96 86   Resp:  13 14   Temp:  99 F (37.2 C) 98.7 F (37.1 C)   TempSrc:  Oral Oral   SpO2: 95% 96% 97%   Weight:      Height:        Intake/Output Summary (Last 24 hours) at 05/29/16 0824 Last data filed at 05/28/16 1500  Gross per 24 hour  Intake              360 ml  Output                0 ml  Net  360 ml   Filed Weights   05/24/16 1148 05/24/16 2244  Weight: 77.1 kg (170 lb) 76.6 kg (168 lb 14 oz)    Exam  General: mild distress, no CP, no SOB and continue to have BM. Patient with continuous nausea and multiple episodes of vomiting overnight. Endorses abd pain.  HEENT: NCAT, no exudates, no thrush  Cardiovascular: S1 S2 auscultated, no rubs, murmurs or gallops. Regular rate and rhythm.  Respiratory: Clear to auscultation bilaterally with equal chest rise,  no tachypnea  Abdomen: Soft, tender to palpation on her lower abdomen bilaterally and across, positive BS, no distension, soft.  Extremities: warm, dry, without cyanosis, clubbing or edema  Neuro: AAOx3, nonfocal  Psych: Appropriate mood and affect   Data Reviewed: I have personally reviewed following labs and imaging studies  CBC:  Recent Labs Lab 05/24/16 1215 05/25/16 0419 05/26/16 0419 05/27/16 0403  WBC 11.8* 7.1 5.8 9.8  HGB 15.0 12.8 10.9* 13.9  HCT 43.9 38.8 33.3* 42.3  MCV 87.1 88.0 88.3 89.1  PLT 312 218 227 119   Basic Metabolic Panel:  Recent Labs Lab 05/25/16 0043 05/25/16 0419 05/26/16 0419 05/27/16 0403 05/28/16 0338 05/29/16 0334  NA  --  140 144 147* 140 139  K  --  3.4* 3.4* 2.5* 2.9* 3.8  CL  --  108 111 113* 113* 111  CO2  --  21* 24 22 21* 22  GLUCOSE  --  106* 142* 151* 153* 157*  BUN  --  11 16 33* 29* 16  CREATININE  --  0.65 0.62 0.97 0.77 0.77  CALCIUM  --  8.7* 8.9 10.1 9.0 9.0  MG 1.3*  --   --  2.4 2.0  --   PHOS  --   --   --   --  2.4*  --    GFR: Estimated Creatinine Clearance: 83.8 mL/min (by C-G formula based on SCr of 0.77 mg/dL).   Liver Function Tests:  Recent Labs Lab 05/24/16 1215  AST 19  ALT 17  ALKPHOS 113  BILITOT 0.9  PROT 8.7*  ALBUMIN 4.0    Recent Labs Lab 05/24/16 1215  LIPASE 20   Urine analysis:    Component Value Date/Time   COLORURINE AMBER (A) 05/24/2016 1205   APPEARANCEUR HAZY (A) 05/24/2016 1205   LABSPEC 1.028 05/24/2016 1205   PHURINE 5.0 05/24/2016 1205   GLUCOSEU NEGATIVE 05/24/2016 1205   HGBUR NEGATIVE 05/24/2016 1205   BILIRUBINUR SMALL (A) 05/24/2016 1205   KETONESUR 20 (A) 05/24/2016 1205   PROTEINUR 30 (A) 05/24/2016 1205   UROBILINOGEN 0.2 03/29/2013 0919   NITRITE NEGATIVE 05/24/2016 1205   LEUKOCYTESUR TRACE (A) 05/24/2016 1205    )No results found for this or any previous visit (from the past 240 hour(s)).    Radiology Studies: Dg Abd 2 Views  Result Date:  05/27/2016 CLINICAL DATA:  Small bowel obstruction EXAM: ABDOMEN - 2 VIEW COMPARISON:  05/26/2016 abdominal radiograph FINDINGS: Enteric tube terminates in the body of the stomach. Cholecystectomy clips are seen in the right upper quadrant of the abdomen. Surgical sutures overlie the right mid abdomen. Relatively gasless abdomen. No appreciable dilated small bowel loops. A few scattered small air-fluid levels are seen in the right abdomen on the decubitus views. No evidence of pneumatosis or pneumoperitoneum. No radiopaque urolithiasis. Mild thoracolumbar spondylosis. IMPRESSION: Relatively gasless abdomen with no appreciable dilated small bowel loops. Nonspecific small air-fluid levels in the right abdomen could be due to ileus or small-bowel obstruction.  Enteric tube terminates in the body of the stomach. Electronically Signed   By: Ilona Sorrel M.D.   On: 05/27/2016 09:31    Scheduled Meds: . chlorhexidine  15 mL Mouth Rinse BID  . enoxaparin (LOVENOX) injection  40 mg Subcutaneous QHS  . lipase/protease/amylase  36,000 Units Oral TID WC  . mouth rinse  15 mL Mouth Rinse q12n4p  . methylPREDNISolone (SOLU-MEDROL) injection  60 mg Intravenous Q12H  . mometasone-formoterol  2 puff Inhalation BID  . pantoprazole (PROTONIX) IV  40 mg Intravenous Daily  . potassium chloride  30 mEq Oral Daily   Continuous Infusions: . dextrose 5 % and 0.45% NaCl 1,000 mL with potassium chloride 40 mEq infusion 75 mL/hr at 05/29/16 0644     LOS: 5 days   Time Spent in minutes   25 minutes  Barton Dubois MD. on 05/29/2016 at 8:24 AM  Between 7am to 7pm - Pager - [phone]  After 7pm go to [email or web address] - password [email or web address]  And look for the night coverage person covering for me after hours  [facility] Office  [phone]

## 2016-05-29 NOTE — Progress Notes (Addendum)
General Surgery [facility]  Assessment & Plan:  SBO             Crohn's disease on Entyvio             Chronic nausea and vomiting - worse last night and this AM             Prior ileocecectomy/appendectomy 10/2008 & Right colectomy 09/2010 - Dr. Serita Grammes             Chronic pancreatic insuffiencey - chronic diarrhea on Creon             AXR this AM with some improvement, gas and stool in descending and sigmoid colon  Await GI evaluation and input             Encouraged OOB, ambulation             Will follow  Hypokalemia             Medical service repleting        Earnstine Regal, MD, [facility]       Office: [phone]    Subjective: Patient with nausea and emesis overnight, liquid stools continue.  Family at bedside.  Objective: Vital signs in last 24 hours: Temp:  [98.3 F (36.8 C)-99 F (37.2 C)] 98.7 F (37.1 C) (03/25 0411) Pulse Rate:  [86-96] 86 (03/25 0411) Resp:  [13-16] 14 (03/25 0411) BP: (136-192)/(91-99) 174/91 (03/25 0539) SpO2:  [95 %-98 %] 97 % (03/25 0411) Last BM Date: 05/28/16  Intake/Output from previous day: 03/24 0701 - 03/25 0700 In: 360 [P.O.:360] Out: -  Intake/Output this shift: No intake/output data recorded.  Physical Exam: HEENT - sclerae clear, mucous membranes moist Abdomen - soft without distension; BS present; minimal tenderness Ext - no edema, non-tender Neuro - alert & oriented, no focal deficits  Lab Results:   Recent Labs  05/27/16 0403  WBC 9.8  HGB 13.9  HCT 42.3  PLT 323   BMET  Recent Labs  05/28/16 0338 05/29/16 0334  NA 140 139  K 2.9* 3.8  CL 113* 111  CO2 21* 22  GLUCOSE 153* 157*  BUN 29* 16  CREATININE 0.77 0.77  CALCIUM 9.0 9.0   PT/INR No results for input(s): LABPROT, INR in the last 72 hours. Comprehensive Metabolic Panel:    Component Value Date/Time   NA 139 05/29/2016 0334   NA 140 05/28/2016 0338   K 3.8 05/29/2016  0334   K 2.9 (L) 05/28/2016 0338   CL 111 05/29/2016 0334   CL 113 (H) 05/28/2016 0338   CO2 22 05/29/2016 0334   CO2 21 (L) 05/28/2016 0338   BUN 16 05/29/2016 0334   BUN 29 (H) 05/28/2016 0338   CREATININE 0.77 05/29/2016 0334   CREATININE 0.77 05/28/2016 0338   GLUCOSE 157 (H) 05/29/2016 0334   GLUCOSE 153 (H) 05/28/2016 0338   CALCIUM 9.0 05/29/2016 0334   CALCIUM 9.0 05/28/2016 0338   AST 19 05/24/2016 1215   AST 24 02/20/2016 1553   ALT 17 05/24/2016 1215   ALT 29 02/20/2016 1553   ALKPHOS 113 05/24/2016 1215   ALKPHOS 107 02/20/2016 1553   BILITOT 0.9 05/24/2016 1215   BILITOT 0.9 02/20/2016 1553   PROT 8.7 (H) 05/24/2016 1215   PROT 7.3 02/20/2016 1553   ALBUMIN 4.0 05/24/2016 1215   ALBUMIN 3.7 02/20/2016 1553    Studies/Results: Dg Abd 2  Views  Result Date: 05/29/2016 CLINICAL DATA:  Abdominal pain for 2 weeks. Recent bowel obstruction EXAM: ABDOMEN - 2 VIEW COMPARISON:  CT abdomen and pelvis May 24, 2016; abdominal radiographs May 27, 2016 FINDINGS: There is no appreciable bowel dilatation currently. There are, however, scattered air-fluid levels throughout the abdomen. No free air evident. There are surgical clips in the right mid abdominal and right upper quadrant regions. No bone lesions. IMPRESSION: Postoperative change right abdomen. Multiple air-fluid levels without bowel dilatation. Question enteritis or a degree of ileus. A degree of bowel obstruction is possible but somewhat less likely given the current radiographic appearance. No evident free air. Electronically Signed   By: Lowella Grip III M.D.   On: 05/29/2016 10:16      Carleena Mires M 05/29/2016  Patient ID: [name], female   DOB: 22-Feb-1961, 56 y.o.   MRN: [id number]

## 2016-05-30 DIAGNOSIS — K567 Ileus, unspecified: Secondary | ICD-10-CM

## 2016-05-30 DIAGNOSIS — R790 Abnormal level of blood mineral: Secondary | ICD-10-CM

## 2016-05-30 LAB — BASIC METABOLIC PANEL
ANION GAP: 5 (ref 5–15)
BUN: 17 mg/dL (ref 6–20)
CALCIUM: 8.8 mg/dL — AB (ref 8.9–10.3)
CO2: 24 mmol/L (ref 22–32)
Chloride: 111 mmol/L (ref 101–111)
Creatinine, Ser: 0.62 mg/dL (ref 0.44–1.00)
GLUCOSE: 119 mg/dL — AB (ref 65–99)
POTASSIUM: 3.7 mmol/L (ref 3.5–5.1)
SODIUM: 140 mmol/L (ref 135–145)

## 2016-05-30 LAB — MAGNESIUM: MAGNESIUM: 1.6 mg/dL — AB (ref 1.7–2.4)

## 2016-05-30 MED ORDER — FAT EMULSION 20 % IV EMUL
240.0000 mL | INTRAVENOUS | Status: AC
  Administered 2016-05-30: 240 mL via INTRAVENOUS
  Filled 2016-05-30: qty 250

## 2016-05-30 MED ORDER — POTASSIUM CHLORIDE 2 MEQ/ML IV SOLN
INTRAVENOUS | Status: AC
  Administered 2016-05-30: 16:00:00 via INTRAVENOUS
  Filled 2016-05-30: qty 1000

## 2016-05-30 MED ORDER — MAGNESIUM SULFATE 2 GM/50ML IV SOLN
2.0000 g | Freq: Once | INTRAVENOUS | Status: AC
  Administered 2016-05-30: 2 g via INTRAVENOUS
  Filled 2016-05-30: qty 50

## 2016-05-30 MED ORDER — POTASSIUM CHLORIDE CRYS ER 20 MEQ PO TBCR
40.0000 meq | EXTENDED_RELEASE_TABLET | Freq: Every day | ORAL | Status: DC
  Administered 2016-06-01 – 2016-06-02 (×2): 40 meq via ORAL
  Filled 2016-05-30 (×3): qty 2

## 2016-05-30 MED ORDER — TRACE MINERALS CR-CU-MN-SE-ZN 10-1000-500-60 MCG/ML IV SOLN
INTRAVENOUS | Status: AC
  Administered 2016-05-30: 21:00:00 via INTRAVENOUS
  Filled 2016-05-30: qty 960

## 2016-05-30 MED ORDER — MAGNESIUM SULFATE 2 GM/50ML IV SOLN: 2.0000 g | Freq: Once | INTRAVENOUS | Status: DC

## 2016-05-30 MED ORDER — HYOSCYAMINE SULFATE 0.5 MG/ML IJ SOLN
0.5000 mg | Freq: Four times a day (QID) | INTRAMUSCULAR | Status: DC | PRN
  Administered 2016-05-30: 0.5 mg via INTRAVENOUS
  Filled 2016-05-30 (×2): qty 1

## 2016-05-30 MED ORDER — INSULIN ASPART 100 UNIT/ML ~~LOC~~ SOLN
0.0000 [IU] | Freq: Four times a day (QID) | SUBCUTANEOUS | Status: DC
  Administered 2016-05-31 (×3): 1 [IU] via SUBCUTANEOUS
  Administered 2016-06-01: 2 [IU] via SUBCUTANEOUS

## 2016-05-30 NOTE — Progress Notes (Signed)
PHARMACY - ADULT TOTAL PARENTERAL NUTRITION CONSULT NOTE   Pharmacy Consult for TPN Indication: crohn's, current SBO  Patient Measurements: Height: 5' 7"  (170.2 cm) Weight: 168 lb 14 oz (76.6 kg) IBW/kg (Calculated) : 61.6 TPN AdjBW (KG): 76.6 Body mass index is 26.45 kg/m.  Insulin Requirements: n/a  Current Nutrition: clear liquid but continues to throw up  IVF: D5 1/2NS with 42mq KCL at 727mhr  Central access: PICC 3/25 TPN start date: 3/26  ASSESSMENT                                                                                                          HPI: [id number] female presented with emesis, abd pain, fatigue. Patient continues to vomit after trying to eat and refuses to have NGT reinserted after it was removed Friday. Per GI orders, to start TPN per pharmacy  Significant events:   Today, 05/30/16  Glucose - blood glucose this AM 119  Electrolytes - WNL except mag slightly low - 2g IV already ordered by Md today  Renal - SCr stable  LFTs - WNL on 3/20, will recheck tomorrow  TGs - tomorrow  Prealbumin - tomorrow  NUTRITIONAL GOALS                                                                                             RD recs: on 3/21, 1900-2000 kcal/day and 85-95 g protein/day  Clinimix E 5/15 at a goal rate of 8042mr + 20% fat emulsion at 35m2m over 12 hr to provide: 96 g/day protein, 1843Kcal/day.  PLAN                                                                                                                         At 1800 today:  Start Clinimix E 5/15 at 40ml42m  20% fat emulsion at 35ml/36mver 12 hours  Plan to advance as tolerated to the goal rate.  TPN to contain standard multivitamins and trace elements.  Reduce IVF to KVO  AGroverSI q6h.   TPN lab panels on Mondays & Thursdays.  F/u daily.   JustinAdrian SaranmD, BCPS Pager [phone] 1:53 PM

## 2016-05-30 NOTE — Progress Notes (Signed)
[facility] Progress Note  Chief Complaint:   Crohn's, bowel obstruction.   Subjective: Still vomiting. Having frequent loose stools. Too weak to ambulate much.   Objective:  Vital signs in last 24 hours: Temp:  [98.3 F (36.8 C)-98.6 F (37 C)] 98.6 F (37 C) (03/26 0402) Pulse Rate:  [85-102] 85 (03/26 0402) Resp:  [13-16] 13 (03/26 0402) BP: (125-148)/(88-98) 125/88 (03/26 0402) SpO2:  [95 %-100 %] 100 % (03/26 0402) Last BM Date: 05/29/16 (loose watery BMs) General:   Alert, well-developed,  White female in NAD EENT:  Normal hearing, non icteric sclera, conjunctive pink.  Heart:  Regular rate and rhythm; no murmurs.no lower extremity edema Pulm: Normal respiratory effort, lungs CTA bilaterally without wheezes or crackles. Abdomen:  Soft, nondistended, nontender.  Normal bowel sounds, no masses felt.  Neurologic:  Alert and  oriented x4;  grossly normal neurologically. Psych:  Alert and cooperative. Normal mood and affect.   Intake/Output from previous day: 03/25 0701 - 03/26 0700 In: 700 [P.O.:100; I.V.:600] Out: 80 [Emesis/NG output:80] Intake/Output this shift: No intake/output data recorded.  Lab Results: No results for input(s): WBC, HGB, HCT, PLT in the last 72 hours. BMET  Recent Labs  05/28/16 0338 05/29/16 0334 05/30/16 0800  NA 140 139 140  K 2.9* 3.8 3.7  CL 113* 111 111  CO2 21* 22 24  GLUCOSE 153* 157* 119*  BUN 29* 16 17  CREATININE 0.77 0.77 0.62  CALCIUM 9.0 9.0 8.8*    Dg Abd 2 Views  Result Date: 05/29/2016 CLINICAL DATA:  Abdominal pain for 2 weeks. Recent bowel obstruction EXAM: ABDOMEN - 2 VIEW COMPARISON:  CT abdomen and pelvis May 24, 2016; abdominal radiographs May 27, 2016 FINDINGS: There is no appreciable bowel dilatation currently. There are, however, scattered air-fluid levels throughout the abdomen. No free air evident. There are surgical clips in the right mid abdominal and right upper quadrant regions.  No bone lesions. IMPRESSION: Postoperative change right abdomen. Multiple air-fluid levels without bowel dilatation. Question enteritis or a degree of ileus. A degree of bowel obstruction is possible but somewhat less likely given the current radiographic appearance. No evident free air. Electronically Signed   By: Lowella Grip III M.D.   On: 05/29/2016 10:16    Assessment / Plan:    56 yo female with Crohn's disease, on Entyvio. Presented with SBO, some mild bowel inflammation (active disease?) Getting steroids. NGT removed Friday but had recurrent vomiting. She doesn't want NGT replaced.  X-ray yesterday showed air fluid levels without SB dilation. Potassium and Magnesium have been low. We recommended narcotic reduction, increased activity, and to keep K+ above 4 and Mg++ above 2. She is still heaving / vomiting. Still requiring narcotics and too weak to ambulate much.  -surgery following. I did explain to patient that currently there isn't a definite mechanical obstruction on xray and that ambulation, narcotic reduction and electrolyte repletion may help. She really wants surgery.  -K+ still low, Mg+ has been added. Need to keep K+ above 4 and Mg++ above 2.    Principal Problem:   N&V (nausea and vomiting) Active Problems:   Crohn's disease ([place])   Abdominal pain   Hypokalemia   Hyperglycemia   Encounter for nasogastric (NG) tube placement   SBO (small bowel obstruction)   Ileus ([place])   Small bowel obstruction    LOS: 6 days   Tye Savoy NP 05/30/2016, 9:44 AM  Pager number [phone]     Attending  physician's note   I have taken a history, examined the patient and reviewed the chart. I agree with the Advanced Practitioner's note, impression and recommendations. Long discussion with pt and her family reinforcing all recommendations in APP's note. TNA indicated and discussed with pt and family.   Lucio Edward, MD [facility] Regal [phone] Mon-Fri 8a-5p [phone] after 5p,  weekends, holidays

## 2016-05-30 NOTE — Progress Notes (Signed)
Subjective: She is having nausea with 1/2 bowel of broth, long before it has a chance of being obstructed.  Just like she has had for some time.  She is having loose BM's.  She thinks that having surgery will eliminate he symptoms and is anxious to have surgery.    Objective: Vital signs in last 24 hours: Temp:  [98.3 F (36.8 C)-98.6 F (37 C)] 98.6 F (37 C) (03/26 0402) Pulse Rate:  [85-102] 85 (03/26 0402) Resp:  [13-16] 13 (03/26 0402) BP: (125-148)/(88-98) 125/88 (03/26 0402) SpO2:  [95 %-100 %] 100 % (03/26 0402) Last BM Date: 05/29/16 (loose watery BMs) 100 PO 600 IV Voided x 3 recorded BM x 4 Afebrile,  VSS K+ 3.7, mag down to 1.6 2 view yesterday:  There is no appreciable bowel dilatation currently. There are, however, scattered air-fluid levels throughout the abdomen. No free air evident. There are surgical clips in the right mid abdominal and right upper quadrant regions. No bone lesions. IMPRESSION: Postoperative change right abdomen. Multiple air-fluid levels without bowel dilatation. Question enteritis or a degree of ileus. A degree of bowel obstruction is possible but somewhat less likely given the current radiographic appearance. No evident free air.    Intake/Output from previous day: 03/25 0701 - 03/26 0700 In: 700 [P.O.:100; I.V.:600] Out: 80 [Emesis/NG output:80] Intake/Output this shift: No intake/output data recorded.  General appearance: alert, cooperative and no distress Resp: clear to auscultation bilaterally GI: soft, nontender, not distended few BS, loose stools x 4 recorded yeserday and she has had one today also.    Lab Results:  No results for input(s): WBC, HGB, HCT, PLT in the last 72 hours.  BMET  Recent Labs  05/29/16 0334 05/30/16 0800  NA 139 140  K 3.8 3.7  CL 111 111  CO2 22 24  GLUCOSE 157* 119*  BUN 16 17  CREATININE 0.77 0.62  CALCIUM 9.0 8.8*   PT/INR No results for input(s): LABPROT, INR in the last 72  hours.   Recent Labs Lab 05/24/16 1215  AST 19  ALT 17  ALKPHOS 113  BILITOT 0.9  PROT 8.7*  ALBUMIN 4.0     Lipase     Component Value Date/Time   LIPASE 20 05/24/2016 1215     Studies/Results: Dg Abd 2 Views  Result Date: 05/29/2016 CLINICAL DATA:  Abdominal pain for 2 weeks. Recent bowel obstruction EXAM: ABDOMEN - 2 VIEW COMPARISON:  CT abdomen and pelvis May 24, 2016; abdominal radiographs May 27, 2016 FINDINGS: There is no appreciable bowel dilatation currently. There are, however, scattered air-fluid levels throughout the abdomen. No free air evident. There are surgical clips in the right mid abdominal and right upper quadrant regions. No bone lesions. IMPRESSION: Postoperative change right abdomen. Multiple air-fluid levels without bowel dilatation. Question enteritis or a degree of ileus. A degree of bowel obstruction is possible but somewhat less likely given the current radiographic appearance. No evident free air. Electronically Signed   By: Lowella Grip III M.D.   On: 05/29/2016 10:16    Medications: . chlorhexidine  15 mL Mouth Rinse BID  . enoxaparin (LOVENOX) injection  40 mg Subcutaneous QHS  . mouth rinse  15 mL Mouth Rinse q12n4p  . methylPREDNISolone (SOLU-MEDROL) injection  60 mg Intravenous Q12H  . mometasone-formoterol  2 puff Inhalation BID  . pantoprazole (PROTONIX) IV  40 mg Intravenous Daily  . potassium chloride  30 mEq Oral Daily  . sodium chloride flush  10-40 mL Intracatheter Q12H   .  dextrose 5 % and 0.45% NaCl 1,000 mL with potassium chloride 40 mEq infusion 75 mL/hr at 05/29/16 0903   Assessment/Plan SBO Crohn's disease on Entyvio - on steroids now Chronic nausea and vomiting Prior ileocecectomy/appendectomy 10/2008 & Right colectomy 09/2010 Chronic pancreatic insuffiencey - chronic diarrhea on Creon Hypokalemia K+ 3.7/ mag 1.6  - add 2 gm Mag+ Dehydration  Gastritis Hx of migraines since 11/2015 Hx of hypertension Hx of  hyperlipidemia FEN: IV fluids ID: No abx DVT: Lovenox     Plan:  Will review with Dr. Barry Dienes. Add some mag+.       LOS: 6 days    [name] 05/30/2016 [phone]

## 2016-05-30 NOTE — Progress Notes (Signed)
PROGRESS NOTE    [name]  EPP:[id number] DOB: 1960/07/25 DOA: 05/24/2016 PCP: [name], DO   Chief Complaint  Patient presents with  . Emesis  . Abdominal Pain  . Fatigue    Brief Narrative:  HPI on 05/24/2016 by Dr. Karmen Bongo [name] is a 56 y.o. female with medical history significant of Crohn's disease with prior surgeries and obstruction; kidney stones; HTN; HLD; and GERD presenting with n/v for over a week.  Marked abdominal pain.  Uncertain etiology.  +Crohn's, but this is unlike her usual flares.  Last BM was yesterday, was its usual watery consistency.  No fevers.    Assessment & Plan   Nausea/vomiting/Abdominal pain/ Possible Small Bowel Obstruction -CT abd/pelvis: Fluid-filled loops of small bowel within the central and lower abdomen with scattered areas of mucosal enhancement could relate to enteritis or mild ileus. -Patient has had multiple abdominal surgeries in the past; at risk for adhesions/obstruction. -NGT removed and will follow further surgery rec's/ patient continue to move BM's; but since 3/24 night has had worsening abd pain, nausea and vomiting. -Recently seen by Dr. Silverio Decamp, GI- had EGD on 05/03/16, which showed residual food in the stomach- ?gastroparesis or other dysmotility disorder. -Gastroenterology consulted and inputs appreciated; will follow rec's -Continue conservative care, IVF, pain management, antiemetics, electrolytes repletion and IV steroids. -CLD currently, but barely tolerating sips -Repeat abd x-ray on 05/29/16; didn't show frank SBO, but continue to suggest ileus vs enteritis; no free air seen.  Hypokalemia/ hypomagnesemia/hypernatremia -Likely secondary to GI losses and dehydration/poor oral intake -Continue to replace and monitor BMP -will continue D5 1/2 NS with K supplementation  -continue potassium repletion; K 3.7 today -Na 140 now (down from 147) -Mg is 1.6; will received 2 gram today; will follow level  in am  Hyperglycemia -?stress response, D5 infusion and use of steroids -Continue to monitor -No history of diabetes  Essential hypertension -will continue IV hydralazine PRN  Crohn's disease -Currently on Entyvio every 8 weeks -Sees Dr. Silverio Decamp -GI consulted and appreciated -Continue IV steroids for now; will follow rec's  PICC line placement -difficulty assessing veins and in need of multiple blood draws and infusion of medications with increase risk for vein irritation.   DVT Prophylaxis  Lovenox  Code Status: Full  Family Communication: husband at bedside  Disposition Plan: remains inpatient will follow general surgery rec's and continue CLD/advancing diet slowly. Will continue repleting electrolytes, continue IV steroids and follow further rec's from GI.   Consultants Gastroenterology General surgery  Procedures  See below for x-ray reports   Antibiotics   Anti-infectives    None      Subjective:   [name] is afebrile, denies CP and SOB. Continue moving BM's. Patient is struggling with CLD, continue have nausea and vomiting and is feeling weak. Very frustrated and wanting to have symptoms resolved.  Objective:   Vitals:   05/29/16 1500 05/29/16 1947 05/29/16 2044 05/30/16 0402  BP: 134/89  (!) 148/98 125/88  Pulse: (!) 102  97 85  Resp: 16  16 13   Temp: 98.6 F (37 C)  98.3 F (36.8 C) 98.6 F (37 C)  TempSrc: Oral  Oral Oral  SpO2: 95% 95% 100% 100%  Weight:      Height:        Intake/Output Summary (Last 24 hours) at 05/30/16 0955 Last data filed at 05/29/16 1738  Gross per 24 hour  Intake  700 ml  Output               80 ml  Net              620 ml   Filed Weights   05/24/16 1148 05/24/16 2244  Weight: 77.1 kg (170 lb) 76.6 kg (168 lb 14 oz)    Exam  General: still nauseated and essentially struggling with CLD. Continue to have intermittent abd pain and even she is moving her bowels continue to be very  symptomatic and frustrated with her condition. No CP, no SOB and is afebrile.  HEENT: NCAT, no exudates, no thrush  Cardiovascular: S1 S2 auscultated, no rubs, murmurs or gallops. Regular rate and rhythm.  Respiratory: Clear to auscultation bilaterally with equal chest rise, no tachypnea  Abdomen: Soft, tender to palpation on her lower abdomen bilaterally and across, positive BS, no distension, soft.  Extremities: warm, dry, without cyanosis, clubbing or edema  Neuro: AAOx3, nonfocal  Psych: Appropriate mood and affect   Data Reviewed: I have personally reviewed following labs and imaging studies  CBC:  Recent Labs Lab 05/24/16 1215 05/25/16 0419 05/26/16 0419 05/27/16 0403  WBC 11.8* 7.1 5.8 9.8  HGB 15.0 12.8 10.9* 13.9  HCT 43.9 38.8 33.3* 42.3  MCV 87.1 88.0 88.3 89.1  PLT 312 218 227 878   Basic Metabolic Panel:  Recent Labs Lab 05/25/16 0043  05/26/16 0419 05/27/16 0403 05/28/16 0338 05/29/16 0334 05/30/16 0800  NA  --   < > 144 147* 140 139 140  K  --   < > 3.4* 2.5* 2.9* 3.8 3.7  CL  --   < > 111 113* 113* 111 111  CO2  --   < > 24 22 21* 22 24  GLUCOSE  --   < > 142* 151* 153* 157* 119*  BUN  --   < > 16 33* 29* 16 17  CREATININE  --   < > 0.62 0.97 0.77 0.77 0.62  CALCIUM  --   < > 8.9 10.1 9.0 9.0 8.8*  MG 1.3*  --   --  2.4 2.0  --  1.6*  PHOS  --   --   --   --  2.4*  --   --   < > = values in this interval not displayed. GFR: Estimated Creatinine Clearance: 83.8 mL/min (by C-G formula based on SCr of 0.62 mg/dL).   Liver Function Tests:  Recent Labs Lab 05/24/16 1215  AST 19  ALT 17  ALKPHOS 113  BILITOT 0.9  PROT 8.7*  ALBUMIN 4.0    Recent Labs Lab 05/24/16 1215  LIPASE 20   Urine analysis:    Component Value Date/Time   COLORURINE AMBER (A) 05/24/2016 1205   APPEARANCEUR HAZY (A) 05/24/2016 1205   LABSPEC 1.028 05/24/2016 1205   PHURINE 5.0 05/24/2016 1205   GLUCOSEU NEGATIVE 05/24/2016 1205   HGBUR NEGATIVE 05/24/2016  1205   BILIRUBINUR SMALL (A) 05/24/2016 1205   KETONESUR 20 (A) 05/24/2016 1205   PROTEINUR 30 (A) 05/24/2016 1205   UROBILINOGEN 0.2 03/29/2013 0919   NITRITE NEGATIVE 05/24/2016 1205   LEUKOCYTESUR TRACE (A) 05/24/2016 1205    )No results found for this or any previous visit (from the past 240 hour(s)).    Radiology Studies: Dg Abd 2 Views  Result Date: 05/29/2016 CLINICAL DATA:  Abdominal pain for 2 weeks. Recent bowel obstruction EXAM: ABDOMEN - 2 VIEW COMPARISON:  CT abdomen and pelvis May 24, 2016; abdominal radiographs May 27, 2016 FINDINGS: There is no appreciable bowel dilatation currently. There are, however, scattered air-fluid levels throughout the abdomen. No free air evident. There are surgical clips in the right mid abdominal and right upper quadrant regions. No bone lesions. IMPRESSION: Postoperative change right abdomen. Multiple air-fluid levels without bowel dilatation. Question enteritis or a degree of ileus. A degree of bowel obstruction is possible but somewhat less likely given the current radiographic appearance. No evident free air. Electronically Signed   By: Lowella Grip III M.D.   On: 05/29/2016 10:16    Scheduled Meds: . chlorhexidine  15 mL Mouth Rinse BID  . enoxaparin (LOVENOX) injection  40 mg Subcutaneous QHS  . magnesium sulfate 1 - 4 g bolus IVPB  2 g Intravenous Once  . mouth rinse  15 mL Mouth Rinse q12n4p  . methylPREDNISolone (SOLU-MEDROL) injection  60 mg Intravenous Q12H  . mometasone-formoterol  2 puff Inhalation BID  . pantoprazole (PROTONIX) IV  40 mg Intravenous Daily  . potassium chloride  40 mEq Oral Daily  . sodium chloride flush  10-40 mL Intracatheter Q12H   Continuous Infusions: . dextrose 5 % and 0.45% NaCl 1,000 mL with potassium chloride 40 mEq infusion 75 mL/hr at 05/29/16 0903     LOS: 6 days   Time Spent in minutes   25 minutes  Barton Dubois MD. on 05/30/2016 at 9:55 AM  Between 7am to 7pm - Pager -  [phone]  After 7pm go to [email or web address] - password [email or web address]  And look for the night coverage person covering for me after hours  [facility] Office  [phone]

## 2016-05-31 ENCOUNTER — Inpatient Hospital Stay (HOSPITAL_COMMUNITY): Payer: BLUE CROSS/BLUE SHIELD

## 2016-05-31 LAB — DIFFERENTIAL
BASOS PCT: 0 %
Basophils Absolute: 0 10*3/uL (ref 0.0–0.1)
EOS PCT: 0 %
Eosinophils Absolute: 0 10*3/uL (ref 0.0–0.7)
Lymphocytes Relative: 12 %
Lymphs Abs: 1.1 10*3/uL (ref 0.7–4.0)
MONO ABS: 0.4 10*3/uL (ref 0.1–1.0)
Monocytes Relative: 5 %
NEUTROS ABS: 7.8 10*3/uL — AB (ref 1.7–7.7)
Neutrophils Relative %: 83 %

## 2016-05-31 LAB — COMPREHENSIVE METABOLIC PANEL
ALBUMIN: 2.8 g/dL — AB (ref 3.5–5.0)
ALK PHOS: 58 U/L (ref 38–126)
ALT: 6 U/L — AB (ref 14–54)
AST: 14 U/L — ABNORMAL LOW (ref 15–41)
Anion gap: 10 (ref 5–15)
BILIRUBIN TOTAL: 0.4 mg/dL (ref 0.3–1.2)
BUN: 9 mg/dL (ref 6–20)
CALCIUM: 8.2 mg/dL — AB (ref 8.9–10.3)
CO2: 19 mmol/L — ABNORMAL LOW (ref 22–32)
Chloride: 99 mmol/L — ABNORMAL LOW (ref 101–111)
Creatinine, Ser: 0.72 mg/dL (ref 0.44–1.00)
GFR calc Af Amer: >60 mL/min (ref >60)
GFR calc non Af Amer: >60 mL/min (ref >60)
GLUCOSE: 541 mg/dL — AB (ref 65–99)
Potassium: 4.9 mmol/L (ref 3.5–5.1)
SODIUM: 128 mmol/L — AB (ref 135–145)
TOTAL PROTEIN: 5.1 g/dL — AB (ref 6.5–8.1)

## 2016-05-31 LAB — GLUCOSE, CAPILLARY
GLUCOSE-CAPILLARY: 123 mg/dL — AB (ref 65–99)
GLUCOSE-CAPILLARY: 126 mg/dL — AB (ref 65–99)
GLUCOSE-CAPILLARY: 141 mg/dL — AB (ref 65–99)
Glucose-Capillary: 113 mg/dL — ABNORMAL HIGH (ref 65–99)
Glucose-Capillary: 120 mg/dL — ABNORMAL HIGH (ref 65–99)

## 2016-05-31 LAB — MAGNESIUM: Magnesium: 1.9 mg/dL (ref 1.7–2.4)

## 2016-05-31 LAB — PREALBUMIN: Prealbumin: 19.1 mg/dL (ref 18–38)

## 2016-05-31 LAB — C DIFFICILE QUICK SCREEN W PCR REFLEX
C DIFFICILE (CDIFF) TOXIN: NEGATIVE
C DIFFICLE (CDIFF) ANTIGEN: NEGATIVE
C Diff interpretation: NOT DETECTED

## 2016-05-31 LAB — CBC
HCT: 34.4 % — ABNORMAL LOW (ref 36.0–46.0)
Hemoglobin: 11.7 g/dL — ABNORMAL LOW (ref 12.0–15.0)
MCH: 29.8 pg (ref 26.0–34.0)
MCHC: 34 g/dL (ref 30.0–36.0)
MCV: 87.8 fL (ref 78.0–100.0)
PLATELETS: 160 10*3/uL (ref 150–400)
RBC: 3.92 MIL/uL (ref 3.87–5.11)
RDW: 14.4 % (ref 11.5–15.5)
WBC: 9.3 10*3/uL (ref 4.0–10.5)

## 2016-05-31 LAB — PHOSPHORUS: Phosphorus: 3.6 mg/dL (ref 2.5–4.6)

## 2016-05-31 LAB — TRIGLYCERIDES: Triglycerides: 954 mg/dL — ABNORMAL HIGH (ref <150)

## 2016-05-31 MED ORDER — M.V.I. ADULT IV INJ
INTRAVENOUS | Status: DC
  Administered 2016-05-31: 17:00:00 via INTRAVENOUS
  Filled 2016-05-31: qty 1440

## 2016-05-31 MED ORDER — M.V.I. ADULT IV INJ
INJECTION | INTRAVENOUS | Status: DC
  Filled 2016-05-31: qty 1440

## 2016-05-31 MED ORDER — KCL IN DEXTROSE-NACL 40-5-0.45 MEQ/L-%-% IV SOLN
INTRAVENOUS | Status: DC
Start: 2016-05-31 — End: 2016-06-02
  Administered 2016-05-31: 1000 mL via INTRAVENOUS
  Filled 2016-05-31 (×2): qty 1000

## 2016-05-31 NOTE — Progress Notes (Signed)
     [facility] Progress Note  Chief Complaint:   Crohn's, vomiting  Subjective: Tolerated some clears today. Still nauseated, no vomiting. Ambulating in room. Stools still loose. She is trying to cut back on Dilaudid  Objective:  Vital signs in last 24 hours: Temp:  [98 F (36.7 C)-99.2 F (37.3 C)] 98 F (36.7 C) (03/27 0531) Pulse Rate:  [81-103] 81 (03/27 0531) Resp:  [17-18] 17 (03/27 0531) BP: (145-183)/(86-96) 145/86 (03/27 0531) SpO2:  [96 %-100 %] 98 % (03/27 0840) Last BM Date: 05/31/16 General:   Alert, well-developed, white female in NAD EENT:  Normal hearing, non icteric sclera, conjunctive pink.  Heart:  Regular rate and rhythm; no murmurs. no lower extremity edema Pulm: Normal respiratory effort Abdomen:  Soft, nondistended, nontender.  Hypoactive bowel sounds, no masses felt.   Neurologic:  Alert and  oriented x4;  grossly normal neurologically. Psych:  Alert and cooperative. Normal mood and affect.   Intake/Output from previous day: 03/26 0701 - 03/27 0700 In: 87 [P.O.:300; I.V.:670] Out: -  Intake/Output this shift: No intake/output data recorded.  Lab Results:  Recent Labs  05/31/16 0500  WBC 9.3  HGB 11.7*  HCT 34.4*  PLT 160   BMET  Recent Labs  05/29/16 0334 05/30/16 0800 05/31/16 0500  NA 139 140 128*  K 3.8 3.7 4.9  CL 111 111 99*  CO2 22 24 19*  GLUCOSE 157* 119* 541*  BUN 16 17 9   CREATININE 0.77 0.62 0.72  CALCIUM 9.0 8.8* 8.2*   LFT  Recent Labs  05/31/16 0500  PROT 5.1*  ALBUMIN 2.8*  AST 14*  ALT 6*  ALKPHOS 58  BILITOT 0.4    Assessment / Plan:  1. Crohn's disease, maintained on Entyvio. Presented with SBO by CTscan. Getting solumedrol. She remains nauseated but tolerated some clears. TNA started yesterday. CT enterography hasn't been done yet. Xray 3 days ago suggested more of a ileus now.Overall she feels better today, just frustrated about nausea. Compazine didn't help, getting dose of  phenergan now. She is still having several loose stools a day, already 4 this am.  -rule out C-diff. -continue solumedrol.    -K+ over 4 now.  -Got 4 doses of Dilaudid yesterday, and has had one this so far this am. Reiterated importance of trying to decrease narcotics.  -Need to normalize blood glucose, stomach will not empty well if we don't -She has been ambulating in the room.  -await CTE results  2. Hyponatremia, on TNA. Management per Pharmacy  3. Hyperglycemia, glucose 541. On TNA, getting insulin.   Principal Problem:   N&V (nausea and vomiting) Active Problems:   Crohn's disease ([place])   Abdominal pain   Hypokalemia   Hyperglycemia   Encounter for nasogastric (NG) tube placement   SBO (small bowel obstruction)   Ileus ([place])   Small bowel obstruction    LOS: 7 days   Tye Savoy  05/31/2016, 9:26 AM  Pager number [phone]      Attending physician's note   I have taken an interval history, reviewed the chart and examined the patient. I agree with the Advanced Practitioner's note, impression and recommendations.   Lucio Edward, MD Marval Regal [phone] Mon-Fri 8a-5p [phone] after 5p, weekends, holidays

## 2016-05-31 NOTE — Progress Notes (Signed)
Nursing Note: IV rate decreased to 20/hr as TPN started by IV [email or web address]

## 2016-05-31 NOTE — Progress Notes (Signed)
CRITICAL VALUE ALERT  Critical value received:  Glucose 541  Date of notification:  05/31/16  Time of notification:  1444  Critical value read back:Yes.    Nurse who received alert:  Moses Manners  MD notified (1st page):  Dr. Dyann Kief  Time of first page:  0950  MD notified (2nd page):  Time of second page:  Responding MD:  Dr. Dyann Kief  Time MD responded:  [phone]

## 2016-05-31 NOTE — Progress Notes (Signed)
PHARMACY - ADULT TOTAL PARENTERAL NUTRITION CONSULT NOTE   Pharmacy Consult for TPN Indication: crohn's, current SBO  Patient Measurements: Height: 5' 7"  (170.2 cm) Weight: 168 lb 14 oz (76.6 kg) IBW/kg (Calculated) : 61.6 TPN AdjBW (KG): 76.6 Body mass index is 26.45 kg/m.  Insulin Requirements: n/a  Current Nutrition: clear liquid but continues to throw up as of yesterday  IVF: D5 1/2NS with 42mq KCL at 255mhr  Central access: PICC 3/25 TPN start date: 3/26  ASSESSMENT                                                                                                          HPI: [id number] female presented with emesis, abd pain, fatigue. Patient continues to vomit after trying to eat and refuses to have NGT reinserted after it was removed Friday. Per GI orders, to start TPN per pharmacy  Significant events:   Today, 05/31/16  Glucose - CBGs 123 and 141 but AM blood glucose 541 - do not believe this number, continue to monitor CBGs  Electrolytes - WNL except Na now slightly low - monitor  Renal - SCr stable  LFTs - slightly low  TGs - 954 (3/27)  Prealbumin - 19.1 (3/27)  NUTRITIONAL GOALS                                                                                             RD recs: on 3/21, 1900-2000 kcal/day and 85-95 g protein/day  Clinimix E 5/15 at a goal rate of 8069mr + 20% fat emulsion at 10m78m over 12 hr to provide: 96 g/day protein, 1843Kcal/day.  PLAN                                                                                                                         At 1800 today:  Increase Clinimix E 5/15 to 60 ml/hr.  Per guidelines, will withhold lipids for now due to TG > 500  Plan to advance as tolerated to the goal rate.  TPN to contain standard multivitamins and trace elements on MWF only.  Continue IVF at KVO [place]/SSI q6h.   TPN lab panels on Mondays &  Thursdays.  F/u daily.   Adrian Saran, PharmD, BCPS Pager  [phone] 05/31/2016 9:28 AM

## 2016-05-31 NOTE — Progress Notes (Signed)
Earnstine Regal PA notified on patient's refusal for CT.

## 2016-05-31 NOTE — Progress Notes (Signed)
Nutrition Follow-up  DOCUMENTATION CODES:   Severe malnutrition in context of acute illness/injury  INTERVENTION:   Monitor magnesium, potassium, and phosphorus daily for at least 3 days, MD to replete as needed, as pt is at risk for refeeding syndrome given severe malnutrition, poor PO intake> 1 week, elevated Glucose levels, low Mg and K levels earlier in admission.  TPN per Pharmacy -recommend advancing slowly Recommend d/c D5 as patient with elevated Glucose and triglyceride levels RD to continue to monitor  NUTRITION DIAGNOSIS:   Malnutrition related to acute illness as evidenced by percent weight loss, energy intake < or equal to 50% for > or equal to 5 days.  Ongoing.  GOAL:   Patient will meet greater than or equal to 90% of their needs  Progressing.  MONITOR:   PO intake, Diet advancement, Labs, Weight trends, I & O's, Other (Comment) (TPN)  REASON FOR ASSESSMENT:   Consult New TPN/TNA  ASSESSMENT:   56 y.o. female with medical history significant of Crohn's disease with prior surgeries and obstruction; kidney stones; HTN; HLD; and GERD presenting with n/v for over a week.  Marked abdominal pain.  Uncertain etiology.  +Crohn's, but this is unlike her usual flares.  Last BM was yesterday, was its usual watery consistency.  No fevers.  She was 5 pounds heavier 2 weeks ago.   Patient still with nausea and unable to tolerate clear liquids at this time. Pt has now been NPO with little clears for 7 days. TPN initiated 3/26. Pt with hyperglycemia and hypertriglyceridemia. Would recommend slowly advancing TPN. Pt with refeeding risk. Recommend d/c IVF of D5 given high levels of TG and glucose. Pt having loose stools as well.  Medications: IV Protonix daily, K-DUR tablet (but pt refusing), D5 and .45% NaCl w/ KCl infusion at 20 ml/hr- provides  81 kcal , IV Phenergan PRN, IV Compazine PRN Labs reviewed: Low Na Glucose: 541 TG: 954  Diet Order:  Diet clear liquid Room  service appropriate? Yes; Fluid consistency: Thin TPN (CLINIMIX-E) Adult .TPN (CLINIMIX-E) Adult  Skin:  Reviewed, no issues  Last BM:  3/27  Height:   Ht Readings from Last 1 Encounters:  05/24/16 5' 7"  (1.702 m)    Weight:   Wt Readings from Last 1 Encounters:  05/24/16 168 lb 14 oz (76.6 kg)    Ideal Body Weight:  61.4 kg  BMI:  Body mass index is 26.45 kg/m.  Estimated Nutritional Needs:   Kcal:  1900-2100  Protein:  85-95g  Fluid:  2L/day  EDUCATION NEEDS:   No education needs identified at this time  Clayton Bibles, MS, RD, LDN Pager: [phone] After Hours Pager: [phone]

## 2016-05-31 NOTE — Progress Notes (Signed)
PROGRESS NOTE    [name]  LNL:[id number] DOB: [birth year] DOA: 05/24/2016 PCP: [name], DO   Chief Complaint  Patient presents with  . Emesis  . Abdominal Pain  . Fatigue    Brief Narrative:  HPI on 05/24/2016 by Dr. Karmen Bongo [name] is a 56 y.o. female with medical history significant of Crohn's disease with prior surgeries and obstruction; kidney stones; HTN; HLD; and GERD presenting with n/v for over a week.  Marked abdominal pain.  Uncertain etiology.  +Crohn's, but this is unlike her usual flares.  Last BM was yesterday, was its usual watery consistency.  No fevers.    Assessment & Plan   Nausea/vomiting/Abdominal pain/ Possible Small Bowel Obstruction -CT abd/pelvis: Fluid-filled loops of small bowel within the central and lower abdomen with scattered areas of mucosal enhancement could relate to enteritis or mild ileus. -Patient has had multiple abdominal surgeries in the past; at risk for adhesions/obstruction. -NGT removed and will follow further surgery rec's/ patient continue to move BM's; but since 3/24 night has had worsening abd pain, nausea and vomiting. -Recently seen by Dr. Silverio Decamp, GI- had EGD on 05/03/16, which showed residual food in the stomach- ?gastroparesis or other dysmotility disorder. -Gastroenterology consulted and inputs appreciated; will follow rec's -Continue conservative care, IVF, pain management, antiemetics, electrolytes repletion and IV steroids. -CLD currently, but barely tolerating sips -Repeat abd x-ray on 05/29/16; didn't show frank SBO, but continue to suggest ileus vs enteritis; no free air seen.  Hypokalemia/ hypomagnesemia/hypernatremia/ hypophosphatemia -Likely secondary to GI losses and dehydration/poor oral intake -continue electrolytes repletion and intermittent monitoring  -now receiving supplementation through TPN  -K 4.9 today, Mg 1.8; Phosphorus 3.6  Hyperglycemia -?stress response, D5 infusion and use  of steroids -Continue to monitor -No history of diabetes  Essential hypertension -will continue IV hydralazine PRN  Crohn's disease -Currently on Entyvio every 8 weeks -Sees Dr. Silverio Decamp -GI consulted and appreciated -Continue IV steroids for now; will follow rec's  PICC line placement -difficulty assessing veins and in need of multiple blood draws and infusion of medications with increase risk for vein irritation.   Moderate calorie malnutrition  -started on TPN  -will monitor and weaned off as able once tolerating PO   DVT Prophylaxis  Lovenox  Code Status: Full  Family Communication: husband at bedside on 3/26; not family at bedside   Disposition Plan: remains inpatient will follow general surgery rec's and continue CLD/advancing diet slowly. Will continue repleting electrolytes, continue IV steroids and follow further rec's from GI.   Consultants Gastroenterology General surgery  Procedures  See below for x-ray reports   Antibiotics   Anti-infectives    None      Subjective:   [name] is afebrile, denies CP and SOB. Continue moving BM's. Patient continue to have nausea and abd pain.   Objective:   Vitals:   05/30/16 2147 05/31/16 0531 05/31/16 0840 05/31/16 1330  BP: (!) 158/89 (!) 145/86  (!) 152/100  Pulse: 96 81  95  Resp: 17 17  16   Temp: 98 F (36.7 C) 98 F (36.7 C)  98.2 F (36.8 C)  TempSrc: Oral Oral  Oral  SpO2: 97% 96% 98% 96%  Weight:      Height:        Intake/Output Summary (Last 24 hours) at 05/31/16 1523 Last data filed at 05/31/16 1416  Gross per 24 hour  Intake             1836 ml  Output                0 ml  Net             1836 ml   Filed Weights   05/24/16 1148 05/24/16 2244  Weight: 77.1 kg (170 lb) 76.6 kg (168 lb 14 oz)    Exam  General: still nauseated and with intermittent abd pain. No vomiting. Overall better today; continue to have BM's. No CP, no SOB and is afebrile. Will like to have  surgery  HEENT: NCAT, no exudates, no thrush  Cardiovascular: S1 S2 auscultated, no rubs, murmurs or gallops. Regular rate and rhythm.  Respiratory: Clear to auscultation bilaterally with equal chest rise, no tachypnea  Abdomen: Soft, tender to palpation on her lower abdomen bilaterally and across, positive BS, no distension, soft.  Extremities: warm, dry, without cyanosis, clubbing or edema  Neuro: AAOx3, nonfocal  Psych: Appropriate mood and affect   Data Reviewed: I have personally reviewed following labs and imaging studies  CBC:  Recent Labs Lab 05/25/16 0419 05/26/16 0419 05/27/16 0403 05/31/16 0500  WBC 7.1 5.8 9.8 9.3  NEUTROABS  --   --   --  7.8*  HGB 12.8 10.9* 13.9 11.7*  HCT 38.8 33.3* 42.3 34.4*  MCV 88.0 88.3 89.1 87.8  PLT 218 227 323 414   Basic Metabolic Panel:  Recent Labs Lab 05/25/16 0043  05/27/16 0403 05/28/16 0338 05/29/16 0334 05/30/16 0800 05/31/16 0500  NA  --   < > 147* 140 139 140 128*  K  --   < > 2.5* 2.9* 3.8 3.7 4.9  CL  --   < > 113* 113* 111 111 99*  CO2  --   < > 22 21* 22 24 19*  GLUCOSE  --   < > 151* 153* 157* 119* 541*  BUN  --   < > 33* 29* 16 17 9   CREATININE  --   < > 0.97 0.77 0.77 0.62 0.72  CALCIUM  --   < > 10.1 9.0 9.0 8.8* 8.2*  MG 1.3*  --  2.4 2.0  --  1.6* 1.9  PHOS  --   --   --  2.4*  --   --  3.6  < > = values in this interval not displayed.   GFR: Estimated Creatinine Clearance: 83.8 mL/min (by C-G formula based on SCr of 0.72 mg/dL).   Liver Function Tests:  Recent Labs Lab 05/31/16 0500  AST 14*  ALT 6*  ALKPHOS 58  BILITOT 0.4  PROT 5.1*  ALBUMIN 2.8*   Urine analysis:    Component Value Date/Time   COLORURINE AMBER (A) 05/24/2016 1205   APPEARANCEUR HAZY (A) 05/24/2016 1205   LABSPEC 1.028 05/24/2016 1205   PHURINE 5.0 05/24/2016 1205   GLUCOSEU NEGATIVE 05/24/2016 1205   HGBUR NEGATIVE 05/24/2016 1205   BILIRUBINUR SMALL (A) 05/24/2016 1205   KETONESUR 20 (A) 05/24/2016 1205    PROTEINUR 30 (A) 05/24/2016 1205   UROBILINOGEN 0.2 03/29/2013 0919   NITRITE NEGATIVE 05/24/2016 1205   LEUKOCYTESUR TRACE (A) 05/24/2016 1205    Recent Results (from the past 240 hour(s))  C difficile quick scan w PCR reflex     Status: None   Collection Time: 05/31/16  1:40 PM  Result Value Ref Range Status   C Diff antigen NEGATIVE NEGATIVE Final   C Diff toxin NEGATIVE NEGATIVE Final   C Diff interpretation No C. difficile detected.  Final  Radiology Studies: Dg Abd 2 Views  Result Date: 05/31/2016 CLINICAL DATA:  Crohn's disease. EXAM: ABDOMEN - 2 VIEW COMPARISON:  05/29/2016 . FINDINGS: Surgical clips right upper quadrant. Surgical sutures right abdomen. Surgical clips left pelvis. Soft tissue structures are unremarkable. No bowel distention . No free air. Pelvic calcifications consistent phleboliths. Degenerative changes lumbar spine and both hips. Sclerotic changes right femoral head. Avascular necrosis cannot be excluded . IMPRESSION: 1.  No acute abnormality identified. 2. Sclerotic changes right femoral head. Avascular necrosis cannot be excluded. Electronically Signed   By: Marcello Moores  Register   On: 05/31/2016 13:38    Scheduled Meds: . chlorhexidine  15 mL Mouth Rinse BID  . enoxaparin (LOVENOX) injection  40 mg Subcutaneous QHS  . insulin aspart  0-9 Units Subcutaneous Q6H  . mouth rinse  15 mL Mouth Rinse q12n4p  . methylPREDNISolone (SOLU-MEDROL) injection  60 mg Intravenous Q12H  . mometasone-formoterol  2 puff Inhalation BID  . pantoprazole (PROTONIX) IV  40 mg Intravenous Daily  . potassium chloride  40 mEq Oral Daily  . sodium chloride flush  10-40 mL Intracatheter Q12H   Continuous Infusions: . Marland KitchenTPN (CLINIMIX-E) Adult    . dextrose 5 % and 0.45 % NaCl with KCl 40 mEq/L 1,000 mL (05/31/16 0854)  . Marland KitchenTPN (CLINIMIX-E) Adult 40 mL/hr at 05/30/16 2055     LOS: 7 days   Time Spent in minutes   25 minutes  Barton Dubois MD. on 05/31/2016 at 3:23 PM  Between  7am to 7pm - Pager - [phone]  After 7pm go to [email or web address] - password [email or web address]  And look for the night coverage person covering for me after hours  [facility] Office  [phone]

## 2016-05-31 NOTE — Progress Notes (Signed)
Patient refused CT, verbalized that she can't drink contrast it will make her sick, I notified radiology re; this matter.

## 2016-05-31 NOTE — Progress Notes (Signed)
Patient ID: [name], female   DOB: 1960/05/23, 56 y.o.   MRN: [id number]  [facility] Progress Note     Subjective: Feeling better today than yesterday. Tolerating some clears but still feels nauseated with PO intake. Denies abdominal distension. Passing small amount of flatus. States that she has had 5 watery BM's this morning, and several yesterday as well.  Objective: Vital signs in last 24 hours: Temp:  [98 F (36.7 C)-99.2 F (37.3 C)] 98 F (36.7 C) (03/27 0531) Pulse Rate:  [81-103] 81 (03/27 0531) Resp:  [17-18] 17 (03/27 0531) BP: (145-183)/(86-96) 145/86 (03/27 0531) SpO2:  [96 %-100 %] 98 % (03/27 0840) Last BM Date: 05/31/16  Intake/Output from previous day: 03/26 0701 - 03/27 0700 In: 970 [P.O.:300; I.V.:670] Out: -  Intake/Output this shift: Total I/O In: 490 [P.O.:480; I.V.:10] Out: -   PE: Gen:  Alert, NAD, pleasant Pulm:  Effort normal Abd: Soft, NT/ND, +BS Ext:  No erythema, edema, or tenderness   Lab Results:   Recent Labs  05/31/16 0500  WBC 9.3  HGB 11.7*  HCT 34.4*  PLT 160   BMET  Recent Labs  05/30/16 0800 05/31/16 0500  NA 140 128*  K 3.7 4.9  CL 111 99*  CO2 24 19*  GLUCOSE 119* 541*  BUN 17 9  CREATININE 0.62 0.72  CALCIUM 8.8* 8.2*   PT/INR No results for input(s): LABPROT, INR in the last 72 hours. CMP     Component Value Date/Time   NA 128 (L) 05/31/2016 0500   K 4.9 05/31/2016 0500   CL 99 (L) 05/31/2016 0500   CO2 19 (L) 05/31/2016 0500   GLUCOSE 541 (HH) 05/31/2016 0500   BUN 9 05/31/2016 0500   CREATININE 0.72 05/31/2016 0500   CALCIUM 8.2 (L) 05/31/2016 0500   PROT 5.1 (L) 05/31/2016 0500   ALBUMIN 2.8 (L) 05/31/2016 0500   AST 14 (L) 05/31/2016 0500   ALT 6 (L) 05/31/2016 0500   ALKPHOS 58 05/31/2016 0500   BILITOT 0.4 05/31/2016 0500   GFRNONAA >60 05/31/2016 0500   GFRAA >60 05/31/2016 0500   Lipase     Component Value Date/Time   LIPASE 20 05/24/2016 1215        Studies/Results: No results found.  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 Follow c diff   LOS: 7 days    Jerrye Beavers , [facility] 05/31/2016, 12:13 PM Pager: [phone] Consults: [phone] Mon-Fri 7:00 am-4:30 pm Sat-Sun 7:00 am-11:30 am

## 2016-06-01 DIAGNOSIS — K5080 Crohn's disease of both small and large intestine without complications: Principal | ICD-10-CM

## 2016-06-01 DIAGNOSIS — K3184 Gastroparesis: Secondary | ICD-10-CM

## 2016-06-01 LAB — COMPREHENSIVE METABOLIC PANEL
ALT: 9 U/L — ABNORMAL LOW (ref 14–54)
AST: 13 U/L — AB (ref 15–41)
Albumin: 3.2 g/dL — ABNORMAL LOW (ref 3.5–5.0)
Alkaline Phosphatase: 68 U/L (ref 38–126)
Anion gap: 8 (ref 5–15)
BILIRUBIN TOTAL: 0.4 mg/dL (ref 0.3–1.2)
BUN: 13 mg/dL (ref 6–20)
CO2: 25 mmol/L (ref 22–32)
Calcium: 8.5 mg/dL — ABNORMAL LOW (ref 8.9–10.3)
Chloride: 105 mmol/L (ref 101–111)
Creatinine, Ser: 0.57 mg/dL (ref 0.44–1.00)
Glucose, Bld: 144 mg/dL — ABNORMAL HIGH (ref 65–99)
POTASSIUM: 3.6 mmol/L (ref 3.5–5.1)
Sodium: 138 mmol/L (ref 135–145)
TOTAL PROTEIN: 6.6 g/dL (ref 6.5–8.1)

## 2016-06-01 LAB — PHOSPHORUS: PHOSPHORUS: 2.9 mg/dL (ref 2.5–4.6)

## 2016-06-01 LAB — GLUCOSE, CAPILLARY
GLUCOSE-CAPILLARY: 118 mg/dL — AB (ref 65–99)
Glucose-Capillary: 116 mg/dL — ABNORMAL HIGH (ref 65–99)
Glucose-Capillary: 151 mg/dL — ABNORMAL HIGH (ref 65–99)
Glucose-Capillary: 130 mg/dL — ABNORMAL HIGH (ref 65–99)

## 2016-06-01 LAB — MAGNESIUM: MAGNESIUM: 1.7 mg/dL (ref 1.7–2.4)

## 2016-06-01 MED ORDER — OXYCODONE HCL 5 MG PO TABS
5.0000 mg | ORAL_TABLET | Freq: Four times a day (QID) | ORAL | Status: DC | PRN
Start: 2016-06-01 — End: 2016-06-02
  Administered 2016-06-01 – 2016-06-02 (×3): 5 mg via ORAL
  Filled 2016-06-01 (×3): qty 1

## 2016-06-01 MED ORDER — ENOXAPARIN SODIUM 40 MG/0.4ML ~~LOC~~ SOLN
40.0000 mg | Freq: Every day | SUBCUTANEOUS | Status: DC
  Administered 2016-06-01: 40 mg via SUBCUTANEOUS
  Filled 2016-06-01: qty 0.4

## 2016-06-01 MED ORDER — PREDNISONE 20 MG PO TABS
40.0000 mg | ORAL_TABLET | Freq: Every day | ORAL | Status: DC
  Administered 2016-06-02: 40 mg via ORAL
  Filled 2016-06-01: qty 2

## 2016-06-01 MED ORDER — INSULIN ASPART 100 UNIT/ML ~~LOC~~ SOLN: 0.0000 [IU] | Freq: Every day | SUBCUTANEOUS | Status: DC

## 2016-06-01 MED ORDER — INSULIN ASPART 100 UNIT/ML ~~LOC~~ SOLN
0.0000 [IU] | Freq: Three times a day (TID) | SUBCUTANEOUS | Status: DC
  Administered 2016-06-01: 1 [IU] via SUBCUTANEOUS

## 2016-06-01 MED ORDER — ACETAMINOPHEN 325 MG PO TABS
650.0000 mg | ORAL_TABLET | Freq: Four times a day (QID) | ORAL | Status: DC | PRN
Start: 2016-06-01 — End: 2016-06-02

## 2016-06-01 NOTE — Progress Notes (Signed)
[facility] Progress Note  Chief Complaint:  Crohn's, nausea    Subjective: Feels much better. Nausea significantly improved. Ambulating.   Objective:  Vital signs in last 24 hours: Temp:  [97.6 F (36.4 C)-98.8 F (37.1 C)] 97.6 F (36.4 C) (03/28 0540) Pulse Rate:  [79-99] 79 (03/28 0540) Resp:  [16] 16 (03/28 0540) BP: (139-166)/(92-100) 139/92 (03/28 0540) SpO2:  [96 %-98 %] 98 % (03/28 0920) Weight:  [162 lb (73.5 kg)] 162 lb (73.5 kg) (03/27 2156) Last BM Date: 05/31/16 General:   Alert, well-developed, white female in NAD EENT:  Normal hearing, non icteric sclera, conjunctive pink.  Heart:  Regular rate and rhythm; no murmurs. No lower extremity edema Pulm: Normal respiratory effort Abdomen:  Soft, nondistended, nontender.  Normal bowel sounds, no masses felt. No hepatomegaly.    Neurologic:  Alert and  oriented x4;  grossly normal neurologically. Psych:  Alert and cooperative. Normal mood and affect.   Intake/Output from previous day: 03/27 0701 - 03/28 0700 In: 2366 [P.O.:960; I.V.:1406] Out: -  Intake/Output this shift: Total I/O In: 240 [P.O.:240] Out: -   Lab Results:  Recent Labs  05/31/16 0500  WBC 9.3  HGB 11.7*  HCT 34.4*  PLT 160   BMET  Recent Labs  05/30/16 0800 05/31/16 0500 06/01/16 0500  NA 140 128* 138  K 3.7 4.9 3.6  CL 111 99* 105  CO2 24 19* 25  GLUCOSE 119* 541* 144*  BUN 17 9 13   CREATININE 0.62 0.72 0.57  CALCIUM 8.8* 8.2* 8.5*   LFT  Recent Labs  06/01/16 0500  PROT 6.6  ALBUMIN 3.2*  AST 13*  ALT 9*  ALKPHOS 68  BILITOT 0.4   PT/INR No results for input(s): LABPROT, INR in the last 72 hours. Hepatitis Panel No results for input(s): HEPBSAG, HCVAB, HEPAIGM, HEPBIGM in the last 72 hours.  Dg Abd 2 Views  Result Date: 05/31/2016 CLINICAL DATA:  Crohn's disease. EXAM: ABDOMEN - 2 VIEW COMPARISON:  05/29/2016 . FINDINGS: Surgical clips right upper quadrant. Surgical sutures right abdomen.  Surgical clips left pelvis. Soft tissue structures are unremarkable. No bowel distention . No free air. Pelvic calcifications consistent phleboliths. Degenerative changes lumbar spine and both hips. Sclerotic changes right femoral head. Avascular necrosis cannot be excluded . IMPRESSION: 1.  No acute abnormality identified. 2. Sclerotic changes right femoral head. Avascular necrosis cannot be excluded. Electronically Signed   By: Marcello Moores  Register   On: 05/31/2016 13:38    Assessment / Plan:  1. Crohn's disease, maintained on Entyvio. Admitted with picture of SBO on CTscan. Subsequently plain films suggested ileus. Treating with solumedrol for ? active Crohn's. CT enterography was ordered yesterday but patient feel she could tolerate the large volume contrast needed for the study. Her nausea has finally relented -will try to advance from clears -Will change from IV to PO steroids -Still getting ~ 4 doses of IV dilaudid a day. Should transition to PO pain meds -Went ahead and made her a follow up appointment in the office   . Principal Problem:   N&V (nausea and vomiting) Active Problems:   Crohn's disease ([place])   Abdominal pain   Hypokalemia   Hyperglycemia   Encounter for nasogastric (NG) tube placement   SBO (small bowel obstruction)   Ileus ([place])   Small bowel obstruction    LOS: 8 days   Tye Savoy NP 06/01/2016, 11:26 AM  Pager number [phone]     Attending physician's note  I have taken an interval history, reviewed the chart and examined the patient. I agree with the Advanced Practitioner's note, impression and recommendations. Improving SBO- not clear this was a fixed stricture, adhesion or an inflammatory stricture. K=3.6, Mg=1.7. Attempt to keep K > 4 and Mg > 2. Change to PO prednisone and advance diet gradually.   Lucio Edward, MD Marval Regal [phone] Mon-Fri 8a-5p [phone] after 5p, weekends, holidays

## 2016-06-01 NOTE — Progress Notes (Signed)
PROGRESS NOTE    [name]  VEH:[id number] DOB: 1960-07-06 DOA: 05/24/2016 PCP: [name], DO   Chief Complaint  Patient presents with  . Emesis  . Abdominal Pain  . Fatigue    Brief Narrative:  HPI on 05/24/2016 by Dr. Karmen Bongo [name] is a 56 y.o. female with medical history significant of Crohn's disease with prior surgeries and obstruction; kidney stones; HTN; HLD; and GERD presenting with n/v for over a week.  Marked abdominal pain.  Uncertain etiology.  +Crohn's, but this is unlike her usual flares.  Last BM was yesterday, was its usual watery consistency.  No fevers.    Assessment & Plan   Nausea/vomiting/Abdominal pain/ Possible Small Bowel Obstruction -CT abd/pelvis: Fluid-filled loops of small bowel within the central and lower abdomen with scattered areas of mucosal enhancement could relate to enteritis or mild ileus. -Patient has had multiple abdominal surgeries in the past; at risk for adhesions/obstruction. -NGT removed and will follow further surgery rec's/ patient continue to move BM's; but since 3/24 night has had worsening abd pain, nausea and vomiting. -Recently seen by Dr. Silverio Decamp, GI- had EGD on 05/03/16, which showed residual food in the stomach- ?gastroparesis or other dysmotility disorder. -Gastroenterology consulted and inputs appreciated; will follow rec's - IV solumedrol change to PO Prednisone, will defer to GI for the length od the tx. Possible crohns?  -Advance diet as tolerated  -changed dilaudid to PO oxy IR for pain control for severe pain, Tylenol for mild-mod pain.  -CT abd enteroscopy?  -discontinue TPN as she tolerated PO -C diff is negative.   Hypokalemia/ hypomagnesemia/hypernatremia/ hypophosphatemia -monitor closely and replete as needed.   Hyperglycemia -check a1c -Continue to monitor -No history of diabetes  Essential hypertension -will continue IV hydralazine PRN  Crohn's disease -Currently on Entyvio  every 8 weeks -Sees Dr. Silverio Decamp -GI consulted and appreciated -Continue PO steroids for now; will follow rec's  Moderate calorie malnutrition  -TPN not at goal, tolerating po diet. Adv as tolerated. Discontinue TPN  DVT Prophylaxis  Lovenox  Code Status: Full  Family Communication: husband at bedside   Disposition Plan: If able to tolerate po diet. Will likely discharge her within next 24 hrs if ok with GI.   Consultants Gastroenterology General surgery  Procedures  See below for x-ray reports   Antibiotics   Anti-infectives    None      Subjective:   States she feels much better today. She was able to tolerate clears this morning. Her nausea appears to have resolved.   Objective:   Vitals:   05/31/16 2126 05/31/16 2156 06/01/16 0540 06/01/16 0920  BP: (!) 166/95  (!) 139/92   Pulse: 99  79   Resp: 16  16   Temp: 98.8 F (37.1 C)  97.6 F (36.4 C)   TempSrc: Oral  Oral   SpO2: 98%  98% 98%  Weight:  73.5 kg (162 lb)    Height:        Intake/Output Summary (Last 24 hours) at 06/01/16 1446 Last data filed at 06/01/16 1005  Gross per 24 hour  Intake             1440 ml  Output                0 ml  Net             1440 ml   Filed Weights   05/24/16 1148 05/24/16 2244 05/31/16 2156  Weight: 77.1  kg (170 lb) 76.6 kg (168 lb 14 oz) 73.5 kg (162 lb)    Exam  General: Overall better today; NAD  HEENT: NCAT, no exudates, no thrush  Cardiovascular: S1 S2 auscultated, no rubs, murmurs or gallops. Regular rate and rhythm.  Respiratory: Clear to auscultation bilaterally with equal chest rise, no tachypnea  Abdomen: Soft, tender to palpation on her lower abdomen bilaterally and across, positive BS, no distension, soft.  Extremities: warm, dry, without cyanosis, clubbing or edema  Neuro: AAOx3, nonfocal  Psych: Appropriate mood and affect   Data Reviewed: I have personally reviewed following labs and imaging studies  CBC:  Recent Labs Lab  05/26/16 0419 05/27/16 0403 05/31/16 0500  WBC 5.8 9.8 9.3  NEUTROABS  --   --  7.8*  HGB 10.9* 13.9 11.7*  HCT 33.3* 42.3 34.4*  MCV 88.3 89.1 87.8  PLT 227 323 423   Basic Metabolic Panel:  Recent Labs Lab 05/27/16 0403 05/28/16 0338 05/29/16 0334 05/30/16 0800 05/31/16 0500 06/01/16 0500  NA 147* 140 139 140 128* 138  K 2.5* 2.9* 3.8 3.7 4.9 3.6  CL 113* 113* 111 111 99* 105  CO2 22 21* 22 24 19* 25  GLUCOSE 151* 153* 157* 119* 541* 144*  BUN 33* 29* 16 17 9 13   CREATININE 0.97 0.77 0.77 0.62 0.72 0.57  CALCIUM 10.1 9.0 9.0 8.8* 8.2* 8.5*  MG 2.4 2.0  --  1.6* 1.9 1.7  PHOS  --  2.4*  --   --  3.6 2.9     GFR: Estimated Creatinine Clearance: 76.4 mL/min (by C-G formula based on SCr of 0.57 mg/dL).   Liver Function Tests:  Recent Labs Lab 05/31/16 0500 06/01/16 0500  AST 14* 13*  ALT 6* 9*  ALKPHOS 58 68  BILITOT 0.4 0.4  PROT 5.1* 6.6  ALBUMIN 2.8* 3.2*   Urine analysis:    Component Value Date/Time   COLORURINE AMBER (A) 05/24/2016 1205   APPEARANCEUR HAZY (A) 05/24/2016 1205   LABSPEC 1.028 05/24/2016 1205   PHURINE 5.0 05/24/2016 1205   GLUCOSEU NEGATIVE 05/24/2016 1205   HGBUR NEGATIVE 05/24/2016 1205   BILIRUBINUR SMALL (A) 05/24/2016 1205   KETONESUR 20 (A) 05/24/2016 1205   PROTEINUR 30 (A) 05/24/2016 1205   UROBILINOGEN 0.2 03/29/2013 0919   NITRITE NEGATIVE 05/24/2016 1205   LEUKOCYTESUR TRACE (A) 05/24/2016 1205    Recent Results (from the past 240 hour(s))  C difficile quick scan w PCR reflex     Status: None   Collection Time: 05/31/16  1:40 PM  Result Value Ref Range Status   C Diff antigen NEGATIVE NEGATIVE Final   C Diff toxin NEGATIVE NEGATIVE Final   C Diff interpretation No C. difficile detected.  Final      Radiology Studies: Dg Abd 2 Views  Result Date: 05/31/2016 CLINICAL DATA:  Crohn's disease. EXAM: ABDOMEN - 2 VIEW COMPARISON:  05/29/2016 . FINDINGS: Surgical clips right upper quadrant. Surgical sutures right  abdomen. Surgical clips left pelvis. Soft tissue structures are unremarkable. No bowel distention . No free air. Pelvic calcifications consistent phleboliths. Degenerative changes lumbar spine and both hips. Sclerotic changes right femoral head. Avascular necrosis cannot be excluded . IMPRESSION: 1.  No acute abnormality identified. 2. Sclerotic changes right femoral head. Avascular necrosis cannot be excluded. Electronically Signed   By: Marcello Moores  Register   On: 05/31/2016 13:38    Scheduled Meds: . chlorhexidine  15 mL Mouth Rinse BID  . enoxaparin (LOVENOX) injection  40  mg Subcutaneous QHS  . insulin aspart  0-5 Units Subcutaneous QHS  . insulin aspart  0-9 Units Subcutaneous TID WC  . mouth rinse  15 mL Mouth Rinse q12n4p  . mometasone-formoterol  2 puff Inhalation BID  . pantoprazole (PROTONIX) IV  40 mg Intravenous Daily  . potassium chloride  40 mEq Oral Daily  . [START ON 06/02/2016] predniSONE  40 mg Oral Q breakfast  . sodium chloride flush  10-40 mL Intracatheter Q12H   Continuous Infusions: . dextrose 5 % and 0.45 % NaCl with KCl 40 mEq/L 1,000 mL (05/31/16 0854)     LOS: 8 days   Time Spent in minutes   25 minutes  Marqus Macphee Chirag Magdelyn Roebuck MD. on 06/01/2016 at 2:46 PM  Between 7am to 7pm - Pager - [phone]  After 7pm go to [email or web address] - password [email or web address]  And look for the night coverage person covering for me after hours  [facility] Office  [phone]

## 2016-06-01 NOTE — Care Management Note (Signed)
Case Management Note  Patient Details  Name: [name] MRN: [id number] Date of Birth: [birth year]  Subjective/Objective:         56 yo admitted with N&V. Pt with hx of Crohn's.           Action/Plan: Pt from home with spouse and independent with ADLs prior to admission. CM will continue to follow and assist with any DC needs if they arise.   Expected Discharge Date:   (unknown)               Expected Discharge Plan:  Home/Self Care  In-House Referral:     Discharge planning Services  CM Consult  Post Acute Care Choice:    Choice offered to:     DME Arranged:    DME Agency:     HH Arranged:    HH Agency:     Status of Service:  In process, will continue to follow  If discussed at Long Length of Stay Meetings, dates discussed:    Additional CommentsLynnell Catalan, RN 06/01/2016, 1:07 PM  [phone]

## 2016-06-02 LAB — GLUCOSE, CAPILLARY
GLUCOSE-CAPILLARY: 107 mg/dL — AB (ref 65–99)
GLUCOSE-CAPILLARY: 113 mg/dL — AB (ref 65–99)
Glucose-Capillary: 84 mg/dL (ref 65–99)

## 2016-06-02 LAB — COMPREHENSIVE METABOLIC PANEL
ALBUMIN: 2.9 g/dL — AB (ref 3.5–5.0)
ALK PHOS: 63 U/L (ref 38–126)
ALT: 9 U/L — ABNORMAL LOW (ref 14–54)
ANION GAP: 7 (ref 5–15)
AST: 13 U/L — AB (ref 15–41)
BILIRUBIN TOTAL: 0.5 mg/dL (ref 0.3–1.2)
BUN: 13 mg/dL (ref 6–20)
CALCIUM: 8.5 mg/dL — AB (ref 8.9–10.3)
CO2: 27 mmol/L (ref 22–32)
Chloride: 106 mmol/L (ref 101–111)
Creatinine, Ser: 0.72 mg/dL (ref 0.44–1.00)
GFR calc Af Amer: >60 mL/min (ref >60)
GFR calc non Af Amer: >60 mL/min (ref >60)
GLUCOSE: 90 mg/dL (ref 65–99)
Potassium: 3 mmol/L — ABNORMAL LOW (ref 3.5–5.1)
SODIUM: 140 mmol/L (ref 135–145)
TOTAL PROTEIN: 5.8 g/dL — AB (ref 6.5–8.1)

## 2016-06-02 LAB — CBC
HCT: 35.7 % — ABNORMAL LOW (ref 36.0–46.0)
HEMOGLOBIN: 11.8 g/dL — AB (ref 12.0–15.0)
MCH: 29.3 pg (ref 26.0–34.0)
MCHC: 33.1 g/dL (ref 30.0–36.0)
MCV: 88.6 fL (ref 78.0–100.0)
PLATELETS: 138 10*3/uL — AB (ref 150–400)
RBC: 4.03 MIL/uL (ref 3.87–5.11)
RDW: 14.8 % (ref 11.5–15.5)
WBC: 12.3 10*3/uL — ABNORMAL HIGH (ref 4.0–10.5)

## 2016-06-02 LAB — MAGNESIUM: Magnesium: 1.5 mg/dL — ABNORMAL LOW (ref 1.7–2.4)

## 2016-06-02 LAB — HEMOGLOBIN A1C
Hgb A1c MFr Bld: 5.7 % — ABNORMAL HIGH (ref 4.8–5.6)
MEAN PLASMA GLUCOSE: 117 mg/dL

## 2016-06-02 LAB — TRIGLYCERIDES: Triglycerides: 156 mg/dL — ABNORMAL HIGH (ref <150)

## 2016-06-02 MED ORDER — PREDNISONE 20 MG PO TABS: 20.0000 mg | ORAL_TABLET | ORAL | 0 refills | Status: DC

## 2016-06-02 MED ORDER — ONDANSETRON 4 MG PO TBDP: 4.0000 mg | ORAL_TABLET | Freq: Three times a day (TID) | ORAL | 0 refills | Status: DC | PRN

## 2016-06-02 MED ORDER — MAGNESIUM SULFATE 2 GM/50ML IV SOLN: 2.0000 g | Freq: Once | INTRAVENOUS | Status: DC

## 2016-06-02 MED ORDER — POTASSIUM CHLORIDE CRYS ER 20 MEQ PO TBCR: 40.0000 meq | EXTENDED_RELEASE_TABLET | Freq: Every day | ORAL | 0 refills | Status: DC

## 2016-06-02 MED ORDER — MAGNESIUM SULFATE 2 GM/50ML IV SOLN
2.0000 g | Freq: Once | INTRAVENOUS | Status: AC
  Administered 2016-06-02: 2 g via INTRAVENOUS
  Filled 2016-06-02: qty 50

## 2016-06-02 MED ORDER — OXYCODONE HCL 5 MG PO TABS: 5.0000 mg | ORAL_TABLET | Freq: Four times a day (QID) | ORAL | 0 refills | Status: DC | PRN

## 2016-06-02 MED ORDER — POTASSIUM CHLORIDE 10 MEQ/100ML IV SOLN
10.0000 meq | INTRAVENOUS | Status: DC
  Filled 2016-06-02 (×6): qty 100

## 2016-06-02 NOTE — Progress Notes (Signed)
Pt discharged from unit via wheelchair.Marland Kitchen  Discharged home with husband in stable condition. Discharge instructions and scripts given. Pt verbalized understanding. No immediate questions or concerns.

## 2016-06-02 NOTE — Progress Notes (Signed)
PROGRESS NOTE    [name]  BSW:[id number] DOB: November 05, 1960 DOA: 05/24/2016 PCP: [name], DO   Chief Complaint  Patient presents with  . Emesis  . Abdominal Pain  . Fatigue    Brief Narrative:  HPI on 05/24/2016 by Dr. Karmen Bongo [name] is a 56 y.o. female with medical history significant of Crohn's disease with prior surgeries and obstruction; kidney stones; HTN; HLD; and GERD presenting with n/v for over a week.  Marked abdominal pain.  Uncertain etiology.  +Crohn's, but this is unlike her usual flares.  Last BM was yesterday, was its usual watery consistency.  No fevers.    Assessment & Plan   Nausea/vomiting/Abdominal pain/ Possible Small Bowel Obstruction -CT abd/pelvis: Fluid-filled loops of small bowel within the central and lower abdomen with scattered areas of mucosal enhancement could relate to enteritis or mild ileus. -Patient has had multiple abdominal surgeries in the past; at risk for adhesions/obstruction. -NGT removed and will follow further surgery rec's/ patient continue to move BM's; but since 3/24 night has had worsening abd pain, nausea and vomiting. -Recently seen by Dr. Silverio Decamp, GI- had EGD on 05/03/16, which showed residual food in the stomach- ?gastroparesis or other dysmotility disorder. -Gastroenterology consulted and inputs appreciated; will follow rec's - Prednisone 73m for 5 days, then decrease it to 359mx 5 days and then 2077maily until seen in GI office.  -Advance diet as tolerated  -changed dilaudid to PO oxy IR for pain control for severe pain, Tylenol for mild-mod pain.  -CT abd enteroscopy?  -tolerating po -C diff is negative.   Hypokalemia/ hypomagnesemia/hypernatremia/ hypophosphatemia -monitor closely and replete as needed.   Hyperglycemia -A1C 5.7 -Continue to monitor -No history of diabetes  Essential hypertension -will continue IV hydralazine PRN  Crohn's disease -Currently on Entyvio every 8  weeks -Sees Dr. NanSilverio DecampI consulted and appreciated -Continue PO steroids for now  Moderate calorie malnutrition  -TPN not at goal, tolerating po diet. Adv as tolerated. Discontinue TPN  DVT Prophylaxis  Lovenox  Code Status: Full  Family Communication: husband at bedside   Disposition Plan: Discharge today.   Consultants Gastroenterology General surgery  Procedures  See below for x-ray reports   Antibiotics   Anti-infectives    None      Subjective:    She was asked to  Eat small meals but she ended up eating a large meal which caused her lower stomach cramp but it soon resolved after. She has been walking on the hallway without any difficulty. She was able to tolerate her lunch. She states she feels much better.  Objective:   Vitals:   06/01/16 2203 06/02/16 0615 06/02/16 0841 06/02/16 1420  BP: 138/79 133/84  128/72  Pulse: 86 81 85 87  Resp: 16 16 18 17   Temp: 99 F (37.2 C) 98.2 F (36.8 C)  99.3 F (37.4 C)  TempSrc: Oral Oral  Oral  SpO2: 98% 97% 99% 98%  Weight:      Height:        Intake/Output Summary (Last 24 hours) at 06/02/16 1435 Last data filed at 06/02/16 0400  Gross per 24 hour  Intake           765.67 ml  Output                1 ml  Net           764.67 ml   Filed Weights   05/24/16 1148 05/24/16 2244  05/31/16 2156  Weight: 77.1 kg (170 lb) 76.6 kg (168 lb 14 oz) 73.5 kg (162 lb)    Exam  General: Overall better today; NAD  HEENT: NCAT, no exudates, no thrush  Cardiovascular: S1 S2 auscultated, no rubs, murmurs or gallops. Regular rate and rhythm.  Respiratory: Clear to auscultation bilaterally with equal chest rise, no tachypnea  Abdomen: Soft, tender to palpation on her lower abdomen bilaterally and across, positive BS, no distension, soft.  Extremities: warm, dry, without cyanosis, clubbing or edema  Neuro: AAOx3, nonfocal  Psych: Appropriate mood and affect   Data Reviewed: I have personally reviewed following  labs and imaging studies  CBC:  Recent Labs Lab 05/27/16 0403 05/31/16 0500 06/02/16 0525  WBC 9.8 9.3 12.3*  NEUTROABS  --  7.8*  --   HGB 13.9 11.7* 11.8*  HCT 42.3 34.4* 35.7*  MCV 89.1 87.8 88.6  PLT 323 160 829*   Basic Metabolic Panel:  Recent Labs Lab 05/28/16 0338 05/29/16 0334 05/30/16 0800 05/31/16 0500 06/01/16 0500 06/02/16 0525  NA 140 139 140 128* 138 140  K 2.9* 3.8 3.7 4.9 3.6 3.0*  CL 113* 111 111 99* 105 106  CO2 21* 22 24 19* 25 27  GLUCOSE 153* 157* 119* 541* 144* 90  BUN 29* 16 17 9 13 13   CREATININE 0.77 0.77 0.62 0.72 0.57 0.72  CALCIUM 9.0 9.0 8.8* 8.2* 8.5* 8.5*  MG 2.0  --  1.6* 1.9 1.7 1.5*  PHOS 2.4*  --   --  3.6 2.9  --      GFR: Estimated Creatinine Clearance: 76.4 mL/min (by C-G formula based on SCr of 0.72 mg/dL).   Liver Function Tests:  Recent Labs Lab 05/31/16 0500 06/01/16 0500 06/02/16 0525  AST 14* 13* 13*  ALT 6* 9* 9*  ALKPHOS 58 68 63  BILITOT 0.4 0.4 0.5  PROT 5.1* 6.6 5.8*  ALBUMIN 2.8* 3.2* 2.9*   Urine analysis:    Component Value Date/Time   COLORURINE AMBER (A) 05/24/2016 1205   APPEARANCEUR HAZY (A) 05/24/2016 1205   LABSPEC 1.028 05/24/2016 1205   PHURINE 5.0 05/24/2016 1205   GLUCOSEU NEGATIVE 05/24/2016 1205   HGBUR NEGATIVE 05/24/2016 1205   BILIRUBINUR SMALL (A) 05/24/2016 1205   KETONESUR 20 (A) 05/24/2016 1205   PROTEINUR 30 (A) 05/24/2016 1205   UROBILINOGEN 0.2 03/29/2013 0919   NITRITE NEGATIVE 05/24/2016 1205   LEUKOCYTESUR TRACE (A) 05/24/2016 1205    Recent Results (from the past 240 hour(s))  C difficile quick scan w PCR reflex     Status: None   Collection Time: 05/31/16  1:40 PM  Result Value Ref Range Status   C Diff antigen NEGATIVE NEGATIVE Final   C Diff toxin NEGATIVE NEGATIVE Final   C Diff interpretation No C. difficile detected.  Final      Radiology Studies: No results found.  Scheduled Meds: . chlorhexidine  15 mL Mouth Rinse BID  . enoxaparin (LOVENOX)  injection  40 mg Subcutaneous QHS  . insulin aspart  0-5 Units Subcutaneous QHS  . insulin aspart  0-9 Units Subcutaneous TID WC  . mouth rinse  15 mL Mouth Rinse q12n4p  . mometasone-formoterol  2 puff Inhalation BID  . pantoprazole (PROTONIX) IV  40 mg Intravenous Daily  . potassium chloride  40 mEq Oral Daily  . predniSONE  40 mg Oral Q breakfast  . sodium chloride flush  10-40 mL Intracatheter Q12H   Continuous Infusions: . dextrose 5 % and  0.45 % NaCl with KCl 40 mEq/L 1,000 mL (05/31/16 0854)     LOS: 9 days   Time Spent in minutes   25 minutes  Jacoria Keiffer Chirag Johna Kearl MD. on 06/02/2016 at 2:35 PM  Between 7am to 7pm - Pager - [phone]  After 7pm go to [email or web address] - password [email or web address]  And look for the night coverage person covering for me after hours  [facility] Office  [phone]

## 2016-06-02 NOTE — Discharge Summary (Signed)
Physician Discharge Summary  [name] KLK:[id number] DOB: 1960/07/20 DOA: 05/24/2016  PCP: [name], DO  Admit date: 05/24/2016 Discharge date: 06/02/2016  Admitted From: Home Disposition:  Home  Recommendations for Outpatient Follow-up:  1. Follow up with PCP in 1 weeks 2. Please obtain BMP and Magnesium levels within one week.  3. Follow up with GI on 06/16/16 4. Prednisone 74m for 5 days, then decrease it to 336mx 5 days and then 2053maily until seen in GI office. 5. Antiemetic, Zofran PRN to be taking orally given.  6. Oxycodone 5mg6m for short period prescription given  7. Take Potassium 40me63mblets for now for 2 weeks. Then follow up outpatient GI and PCP instructions depending on your lab work.   Home Health: No Equipment/Devices: none Discharge Condition: stable  CODE STATUS: Full  Diet recommendation: Heart Healthy   Brief/Interim Summary: 55 ye59 old female with past medical history of Crohn's with multiple surgeries and obstruction, kidney stone, hypertension, hyperlipidemia, GERD came to the hospital with the complaints of 10 days of worsening nausea and vomiting along with nonspecific abdominal pain. CT of the abdomen pelvis done in the ED indicated possible obstruction, inflammatory versus infection. Empiric steroids were started, she was made nothing by mouth and NG tube was placed. GI and surgery were consulted. She was unable to tolerate oral diet for several days due to nausea therefore PICC line was placed and TNA was started for a couple of days. Repeat x-rays suggested maybe ileus. There was also concerns that this could be gastroparesis therefore A1c was ordered which was 5.7. Over the course of several days her symptoms significantly improved and she was started tolerating oral diet. Her IV steroids were switched to prednisone. She was ambulating well in the hallway. We did try to get CT enterography but due to nausea she was unable to tolerate large  amounts of contrast therefore was put on hold. Her labs did show intermittent hypokalemia therefore she was on supplemental potassium. On the day of the discharge she tolerated her diet well, she was in waiting in the hallway and her nausea had resolved. Her steroids were switched to oral and was plan to give her prednisone 40 mg for 5 days then 30 mg for 5 days and then 20 mg until she was seen by GI in the office. She has a follow-up appointment with GI on 06/16/2016. Today she is deemed stable and has reached maximum benefit from inpatient hospital stay therefore being discharged. She was given instructions as noted above.  Discharge Diagnoses:  Principal Problem:   N&V (nausea and vomiting) Active Problems:   [place]   Hypokalemia   Hyperglycemia   Encounter for nasogastric (NG) tube placement   SBO (small bowel obstruction)   Ileus (HCC)   Small bowel obstruction  Possible Small Bowel Obstruction vs Ileus vs Crohns flare - resolved  -CT abd/pelvis: Fluid-filled loops of small bowel within the central and lower abdomen with scattered areas of mucosal enhancement could relate to enteritis or mild ileus. -Patient has had multiple abdominal surgeries in the past; at risk for adhesions/obstruction. -NGT removed and will follow further surgery rec's/ patient continue to move BM's; but since 3/24 night has had worsening abd pain, nausea and vomiting. -Recently seen by Dr. NandiSilverio Decamp had EGD on 05/03/16, which showed residual food in the stomach- ?gastroparesis or other dysmotility disorder. -Gastroenterology consulted and inputs appreciated; will follow rec's - Prednisone 40mg 71m5 days,  then decrease it to 31m x 5 days and then 265mdaily until seen in GI office.  -Advance diet as tolerated  -changed dilaudid to PO oxy IR for pain control for severe pain, Tylenol for mild-mod pain.  -CT abd enteroscopy can be obtained as outpatient if needed.  -tolerating  po -C diff is negative.   Hypokalemia/ hypomagnesemia/hypernatremia/ hypophosphatemia -monitor closely and replete as needed.   Hyperglycemia -A1C 5.7 -Continue to monitor -No history of diabetes  Essential hypertension -will continue IV hydralazine PRN  Crohn's disease -Currently on Entyvio every 8 weeks -Sees Dr. NaSilverio DecampGI consulted and appreciated -Continue PO steroids for now  Moderate calorie malnutrition  -TPN not at goal, tolerating po diet. Adv as tolerated. Discontinue TPN  Discharge Instructions   Allergies as of 06/02/2016      Reactions   Codeine Hives, Nausea And Vomiting   Humira [adalimumab] Rash      Medication List    TAKE these medications   amLODipine 10 MG tablet Commonly known as:  NORVASC Take 10 mg by mouth daily.   cyanocobalamin 1000 MCG/ML injection Commonly known as:  (VITAMIN B-12) Inject 1 mL (1,000 mcg total) into the skin every 30 (thirty) days.   ENTYVIO IV Inject into the vein every 8 (eight) weeks.   gabapentin 600 MG tablet Commonly known as:  NEURONTIN Take 600-1,800 mg by mouth 3 (three) times daily. Takes 600 mg at lunch, 1200 mg at dinner, and 1800 mg at bedtime   hydrocortisone 1 % Crea Commonly known as:  PROCTOCORT Use rectally as needed What changed:  how much to take  how to take this  when to take this  reasons to take this  additional instructions   lipase/protease/amylase 36000 UNITS Cpep capsule Commonly known as:  CREON Take 2 caps with each meal What changed:  how much to take  how to take this  when to take this  additional instructions   Needles & Syringes Misc 1 Syringe by Does not apply route every 30 (thirty) days.   omeprazole 40 MG capsule Commonly known as:  PRILOSEC Take 1 capsule (40 mg total) by mouth daily.   ondansetron 4 MG disintegrating tablet Commonly known as:  ZOFRAN ODT Take 1 tablet (4 mg total) by mouth every 8 (eight) hours as needed for nausea or  vomiting.   ondansetron 4 MG tablet Commonly known as:  ZOFRAN Take 1 tablet (4 mg total) by mouth 2 (two) times daily.   oxyCODONE 5 MG immediate release tablet Commonly known as:  Oxy IR/ROXICODONE Take 1 tablet (5 mg total) by mouth every 6 (six) hours as needed for severe pain or breakthrough pain. What changed:  medication strength  how much to take  when to take this  reasons to take this   potassium chloride SA 20 MEQ tablet Commonly known as:  K-DUR,KLOR-CON Take 2 tablets (40 mEq total) by mouth daily. Start taking on:  06/03/2016   predniSONE 20 MG tablet Commonly known as:  DELTASONE Take 1 tablet (20 mg total) by mouth See admin instructions. prednisone 408maily x 5 days,  then decrease to 90m3m5 days, then stay on 20 mg until seen by GI in the office on 06/16/16   PROAIR HFA 108 (90 Base) MCG/ACT inhaler Generic drug:  albuterol Inhale 2 puffs into the lungs every 4 (four) hours as needed for wheezing or shortness of breath.   promethazine 25 MG suppository Commonly known as:  PHENERGAN Place 1  suppository (25 mg total) rectally every 6 (six) hours as needed for nausea or vomiting.   promethazine 25 MG tablet Commonly known as:  PHENERGAN TAKE 1 TABLET BY MOUTH EVERY 6 HOURS AS NEEDED FOR NAUSEA AND VOMITING   SYMBICORT 80-4.5 MCG/ACT inhaler Generic drug:  budesonide-formoterol Inhale 2 puffs into the lungs daily as needed (SOB, wheezing).   tiZANidine 4 MG tablet Commonly known as:  ZANAFLEX Take 4 mg by mouth 3 (three) times daily.   topiramate 50 MG tablet Commonly known as:  TOPAMAX Take 1 tablet (50 mg total) by mouth at bedtime.   zolmitriptan 5 MG nasal solution Commonly known as:  ZOMIG 1 spray in one nostril.  May repeat 1 spray once after 2 hours if needed. Not to exceed 2 sprays in 24 hours What changed:  how much to take  how to take this  when to take this  reasons to take this  additional instructions      Follow-up  Information    Norberto Sorenson T. Fuller Plan, MD. Daphane Shepherd on 06/16/2016.   Specialty:  Gastroenterology Contact information: [address] [place] [address] [phone]        COLLINS, [place], DO. Schedule an appointment as soon as possible for a visit in 1 week(s).   Specialty:  Family Medicine Contact information: [place] [address] [phone]          Allergies  Allergen Reactions  . Codeine Hives and Nausea And Vomiting  . Humira [Adalimumab] Rash    Consultations:  GI  Surgery   Procedures/Studies: Ct Abdomen Pelvis W Contrast  Result Date: 05/24/2016 CLINICAL DATA:  Intermittent vomiting with generalized abdominal pain EXAM: CT ABDOMEN AND PELVIS WITH CONTRAST TECHNIQUE: Multidetector CT imaging of the abdomen and pelvis was performed using the standard protocol following bolus administration of intravenous contrast. CONTRAST:  136m ISOVUE-300 IOPAMIDOL (ISOVUE-300) INJECTION 61% COMPARISON:  02/20/2016 FINDINGS: Lower chest: Lung bases demonstrate no acute consolidation or pleural effusion. Normal heart size. Hepatobiliary: No focal hepatic abnormality. Intra and extrahepatic biliary dilatation unchanged status post cholecystectomy. Pancreas: Unremarkable. No pancreatic ductal dilatation or surrounding inflammatory changes. Spleen: Normal in size without focal abnormality. Adrenals/Urinary Tract: Adrenal glands within normal limits. Stable subcentimeter hypodense lesion mid left kidney. No hydronephrosis. Bladder normal. Stomach/Bowel: Fluid-filled small bowel loops with mild mucosal enhancement. Decompressed distal small bowel loops with transition from fluid filled bowel to decompressed bowel, series 2, image number 64. No significant bowel wall thickening. Postsurgical changes of the colon with anastomosis in the right abdomen. Diffuse fluid-filled colon without wall thickening. Vascular/Lymphatic: Aortic atherosclerosis. No enlarged abdominal or  pelvic lymph nodes. Reproductive: Uterus and bilateral adnexa are unremarkable. Other: No free air. No free fluid. Postsurgical changes of the ventral abdominal wall. Musculoskeletal: No acute or suspicious bone lesions. Degenerative changes. IMPRESSION: 1. Fluid-filled loops of small bowel within the central and lower abdomen with scattered areas of mucosal enhancement could relate to enteritis or mild ileus. Transition from fluid-filled bowel to decompressed small bowel in the right upper pelvis, cannot exclude partial or developing obstruction. Diffuse fluid-filled colon but without focal wall thickening to suggest an acute colitis. 2. Intra and extrahepatic biliary dilatation status post cholecystectomy. Electronically Signed   By: KDonavan FoilM.D.   On: 05/24/2016 18:24   Dg Abd 2 Views  Result Date: 05/31/2016 CLINICAL DATA:  Crohn's disease. EXAM: ABDOMEN - 2 VIEW COMPARISON:  05/29/2016 . FINDINGS: Surgical clips right upper quadrant. Surgical sutures right  abdomen. Surgical clips left pelvis. Soft tissue structures are unremarkable. No bowel distention . No free air. Pelvic calcifications consistent phleboliths. Degenerative changes lumbar spine and both hips. Sclerotic changes right femoral head. Avascular necrosis cannot be excluded . IMPRESSION: 1.  No acute abnormality identified. 2. Sclerotic changes right femoral head. Avascular necrosis cannot be excluded. Electronically Signed   By: Marcello Moores  Register   On: 05/31/2016 13:38   Dg Abd 2 Views  Result Date: 05/29/2016 CLINICAL DATA:  Abdominal pain for 2 weeks. Recent bowel obstruction EXAM: ABDOMEN - 2 VIEW COMPARISON:  CT abdomen and pelvis May 24, 2016; abdominal radiographs May 27, 2016 FINDINGS: There is no appreciable bowel dilatation currently. There are, however, scattered air-fluid levels throughout the abdomen. No free air evident. There are surgical clips in the right mid abdominal and right upper quadrant regions. No bone  lesions. IMPRESSION: Postoperative change right abdomen. Multiple air-fluid levels without bowel dilatation. Question enteritis or a degree of ileus. A degree of bowel obstruction is possible but somewhat less likely given the current radiographic appearance. No evident free air. Electronically Signed   By: Lowella Grip III M.D.   On: 05/29/2016 10:16   Dg Abd 2 Views  Result Date: 05/27/2016 CLINICAL DATA:  Small bowel obstruction EXAM: ABDOMEN - 2 VIEW COMPARISON:  05/26/2016 abdominal radiograph FINDINGS: Enteric tube terminates in the body of the stomach. Cholecystectomy clips are seen in the right upper quadrant of the abdomen. Surgical sutures overlie the right mid abdomen. Relatively gasless abdomen. No appreciable dilated small bowel loops. A few scattered small air-fluid levels are seen in the right abdomen on the decubitus views. No evidence of pneumatosis or pneumoperitoneum. No radiopaque urolithiasis. Mild thoracolumbar spondylosis. IMPRESSION: Relatively gasless abdomen with no appreciable dilated small bowel loops. Nonspecific small air-fluid levels in the right abdomen could be due to ileus or small-bowel obstruction. Enteric tube terminates in the body of the stomach. Electronically Signed   By: Ilona Sorrel M.D.   On: 05/27/2016 09:31   Dg Abd 2 Views  Result Date: 05/26/2016 CLINICAL DATA:  Follow-up small bowel obstruction EXAM: ABDOMEN - 2 VIEW COMPARISON:  05/25/2016 FINDINGS: NG tube tip is in the mid stomach. No small bowel dilatation currently. No free air organomegaly. Prior cholecystectomy. IMPRESSION: No current evidence for bowel obstruction. Electronically Signed   By: Rolm Baptise M.D.   On: 05/26/2016 11:56   Dg Abd Portable 1v-small Bowel Obstruction Protocol-initial, 8 Hr Delay  Result Date: 05/25/2016 CLINICAL DATA:  56 year old female with small bowel obstruction. 8 hour delayed image. EXAM: PORTABLE ABDOMEN - 1 VIEW COMPARISON:  Abdominal CT dated 05/24/2016 and  radiographs dated 05/25/2016 and 05/24/2016 FINDINGS: Oral contrast is seen in the distal small bowel as well as throughout the colon and rectosigmoid. There is mild dilatation of the small bowel in the right lower quadrant measuring 3.5 cm in diameter. Chief an enteric tube is noted within the stomach. No free air identified. Right upper quadrant cholecystectomy clips. IMPRESSION: Mildly dilated small bowel loop in the right lower quadrant measuring 3.5 cm in diameter. There is however passage of contrast into the colon. Electronically Signed   By: Anner Crete M.D.   On: 05/25/2016 22:29   Dg Abd Portable 1v-small Bowel Protocol-position Verification  Result Date: 05/25/2016 CLINICAL DATA:  Nasogastric tube placement. EXAM: PORTABLE ABDOMEN - 1 VIEW COMPARISON:  Radiograph of May 24, 2016. FINDINGS: The bowel gas pattern is normal. Distal tip of nasogastric tube is seen in expected position  of stomach. Status post cholecystectomy. IMPRESSION: Distal tip of nasogastric tube seen in expected position of stomach. Electronically Signed   By: Marijo Conception, M.D.   On: 05/25/2016 13:53   Dg Abd Portable 1v  Result Date: 05/24/2016 CLINICAL DATA:  NG tube placement EXAM: PORTABLE ABDOMEN - 1 VIEW COMPARISON:  None. FINDINGS: There is residual contrast within the renal collecting systems and bladder. Esophageal tube tip overlies the mid stomach. Surgical suture in the right abdomen IMPRESSION: Esophageal tube tip and side port overlie a the mid to proximal stomach. Electronically Signed   By: Donavan Foil M.D.   On: 05/24/2016 21:57      Subjective:   Discharge Exam: Vitals:   06/02/16 0841 06/02/16 1420  BP:  128/72  Pulse: 85 87  Resp: 18 17  Temp:  99.3 F (37.4 C)   Vitals:   06/01/16 2203 06/02/16 0615 06/02/16 0841 06/02/16 1420  BP: 138/79 133/84  128/72  Pulse: 86 81 85 87  Resp: 16 16 18 17   Temp: 99 F (37.2 C) 98.2 F (36.8 C)  99.3 F (37.4 C)  TempSrc: Oral Oral   Oral  SpO2: 98% 97% 99% 98%  Weight:      Height:        General: Pt is alert, awake, not in acute distress Cardiovascular: RRR, S1/S2 +, no rubs, no gallops Respiratory: CTA bilaterally, no wheezing, no rhonchi Abdominal: Soft, NT, ND, bowel sounds + Extremities: no edema, no cyanosis    The results of significant diagnostics from this hospitalization (including imaging, microbiology, ancillary and laboratory) are listed below for reference.     Microbiology: Recent Results (from the past 240 hour(s))  C difficile quick scan w PCR reflex     Status: None   Collection Time: 05/31/16  1:40 PM  Result Value Ref Range Status   C Diff antigen NEGATIVE NEGATIVE Final   C Diff toxin NEGATIVE NEGATIVE Final   C Diff interpretation No C. difficile detected.  Final     Labs: BNP (last 3 results) No results for input(s): BNP in the last 8760 hours. Basic Metabolic Panel:  Recent Labs Lab 05/28/16 0338 05/29/16 0334 05/30/16 0800 05/31/16 0500 06/01/16 0500 06/02/16 0525  NA 140 139 140 128* 138 140  K 2.9* 3.8 3.7 4.9 3.6 3.0*  CL 113* 111 111 99* 105 106  CO2 21* 22 24 19* 25 27  GLUCOSE 153* 157* 119* 541* 144* 90  BUN 29* 16 17 9 13 13   CREATININE 0.77 0.77 0.62 0.72 0.57 0.72  CALCIUM 9.0 9.0 8.8* 8.2* 8.5* 8.5*  MG 2.0  --  1.6* 1.9 1.7 1.5*  PHOS 2.4*  --   --  3.6 2.9  --    Liver Function Tests:  Recent Labs Lab 05/31/16 0500 06/01/16 0500 06/02/16 0525  AST 14* 13* 13*  ALT 6* 9* 9*  ALKPHOS 58 68 63  BILITOT 0.4 0.4 0.5  PROT 5.1* 6.6 5.8*  ALBUMIN 2.8* 3.2* 2.9*   No results for input(s): LIPASE, AMYLASE in the last 168 hours. No results for input(s): AMMONIA in the last 168 hours. CBC:  Recent Labs Lab 05/27/16 0403 05/31/16 0500 06/02/16 0525  WBC 9.8 9.3 12.3*  NEUTROABS  --  7.8*  --   HGB 13.9 11.7* 11.8*  HCT 42.3 34.4* 35.7*  MCV 89.1 87.8 88.6  PLT 323 160 138*   Cardiac Enzymes: No results for input(s): CKTOTAL, CKMB,  CKMBINDEX, TROPONINI in the last  168 hours. BNP: Invalid input(s): POCBNP CBG:  Recent Labs Lab 06/01/16 1242 06/01/16 1826 06/01/16 2157 06/02/16 0734 06/02/16 1209  GLUCAP 130* 118* 107* 84 113*   D-Dimer No results for input(s): DDIMER in the last 72 hours. Hgb A1c  Recent Labs  05/31/16 0500  HGBA1C 5.7*   Lipid Profile  Recent Labs  05/31/16 0548 06/02/16 0525  TRIG 954* 156*   Thyroid function studies No results for input(s): TSH, T4TOTAL, T3FREE, THYROIDAB in the last 72 hours.  Invalid input(s): FREET3 Anemia work up No results for input(s): VITAMINB12, FOLATE, FERRITIN, TIBC, IRON, RETICCTPCT in the last 72 hours. Urinalysis    Component Value Date/Time   COLORURINE AMBER (A) 05/24/2016 1205   APPEARANCEUR HAZY (A) 05/24/2016 1205   LABSPEC 1.028 05/24/2016 1205   PHURINE 5.0 05/24/2016 1205   GLUCOSEU NEGATIVE 05/24/2016 1205   HGBUR NEGATIVE 05/24/2016 1205   BILIRUBINUR SMALL (A) 05/24/2016 1205   KETONESUR 20 (A) 05/24/2016 1205   PROTEINUR 30 (A) 05/24/2016 1205   UROBILINOGEN 0.2 03/29/2013 0919   NITRITE NEGATIVE 05/24/2016 1205   LEUKOCYTESUR TRACE (A) 05/24/2016 1205   Sepsis Labs Invalid input(s): PROCALCITONIN,  WBC,  LACTICIDVEN Microbiology Recent Results (from the past 240 hour(s))  C difficile quick scan w PCR reflex     Status: None   Collection Time: 05/31/16  1:40 PM  Result Value Ref Range Status   C Diff antigen NEGATIVE NEGATIVE Final   C Diff toxin NEGATIVE NEGATIVE Final   C Diff interpretation No C. difficile detected.  Final     Time coordinating discharge: Over 30 minutes  SIGNED:   Damita Lack, MD  [facility] 06/02/2016, 2:50 PM Pager   If 7PM-7AM, please contact night-coverage [email or web address] Password [email or web address]

## 2016-06-02 NOTE — Progress Notes (Signed)
Nutrition Follow-up  DOCUMENTATION CODES:   Severe malnutrition in context of acute illness/injury  INTERVENTION:   Monitor magnesium, potassium, and phosphorus daily for at least 3 days, MD to replete as needed, as pt is at risk for refeeding syndrome given severe malnutrition, poor PO intake> 1 week, elevated Glucose levels, low Mg and K levels earlier in admission.  Encourage PO intake Reviewed dairy-free protein supplement options  NUTRITION DIAGNOSIS:   Malnutrition related to acute illness as evidenced by percent weight loss, energy intake < or equal to 50% for > or equal to 5 days.  Ongoing.  GOAL:   Patient will meet greater than or equal to 90% of their needs  Progressing.  MONITOR:   PO intake, Labs, Weight trends, I & O's  ASSESSMENT:   56 y.o. female with medical history significant of Crohn's disease with prior surgeries and obstruction; kidney stones; HTN; HLD; and GERD presenting with n/v for over a week.  Marked abdominal pain.  Uncertain etiology.  +Crohn's, but this is unlike her usual flares.  Last BM was yesterday, was its usual watery consistency.  No fevers.  She was 5 pounds heavier 2 weeks ago.   TPN d/c. Pt now on soft diet and eating 100% of meals. Pt states she ate a boiled egg, toast and potatoes this morning. States she ate a little too quickly from being hungry. Nausea is more controlled. Pt not interested in protein supplements at this time. Does not tolerate milk products d/t crohn's disease. Reviewed dairy-free proteins supplement options.  Medications: IV Mg sulfate once, IV Protonix daily, K-DUR tablet daily, IV Compazine PRN, IV Phenergan PRN Labs reviewed: CBGs: 84-113 Low K, Mg TG: 156  Diet Order:  DIET SOFT Room service appropriate? Yes; Fluid consistency: Thin  Skin:  Reviewed, no issues  Last BM:  3/29  Height:   Ht Readings from Last 1 Encounters:  05/24/16 5' 7"  (1.702 m)    Weight:   Wt Readings from Last 1  Encounters:  05/31/16 162 lb (73.5 kg)    Ideal Body Weight:  61.4 kg  BMI:  Body mass index is 25.37 kg/m.  Estimated Nutritional Needs:   Kcal:  1900-2100  Protein:  85-95g  Fluid:  2L/day  EDUCATION NEEDS:   No education needs identified at this time  Clayton Bibles, MS, RD, LDN Pager: [phone] After Hours Pager: [phone]

## 2016-06-02 NOTE — Progress Notes (Signed)
[facility] Progress Note  Chief Complaint:   Crohn's, nausea  Subjective: Feels pretty well today, some nausea after solid meal, ambulating. + flatus  Objective:  Vital signs in last 24 hours: Temp:  [98.2 F (36.8 C)-99 F (37.2 C)] 98.2 F (36.8 C) (03/29 0615) Pulse Rate:  [81-104] 85 (03/29 0841) Resp:  [16-18] 18 (03/29 0841) BP: (133-138)/(79-84) 133/84 (03/29 0615) SpO2:  [97 %-99 %] 99 % (03/29 0841) Last BM Date: 06/02/16 General:   Alert, well-developed white female in NAD EENT:  Normal hearing, non icteric sclera, conjunctive pink.  Heart:  Regular rate and rhythm; no murmurs. No lower extremity edema Pulm: Normal respiratory effort, lungs CTA bilaterally without wheezes or crackles. Abdomen:  Soft,  nontender.  A few abnormal bowel sounds this am but no distention. .    Neurologic:  Alert and  oriented x4;  grossly normal neurologically. Psych:  Alert and cooperative. Normal mood and affect.   Intake/Output from previous day: 03/28 0701 - 03/29 0700 In: 1005.7 [P.O.:840; I.V.:165.7] Out: 1 [Stool:1] Intake/Output this shift: No intake/output data recorded.  Lab Results:  Recent Labs  05/31/16 0500 06/02/16 0525  WBC 9.3 12.3*  HGB 11.7* 11.8*  HCT 34.4* 35.7*  PLT 160 138*   BMET  Recent Labs  05/31/16 0500 06/01/16 0500 06/02/16 0525  NA 128* 138 140  K 4.9 3.6 3.0*  CL 99* 105 106  CO2 19* 25 27  GLUCOSE 541* 144* 90  BUN 9 13 13   CREATININE 0.72 0.57 0.72  CALCIUM 8.2* 8.5* 8.5*   LFT  Recent Labs  06/02/16 0525  PROT 5.8*  ALBUMIN 2.9*  AST 13*  ALT 9*  ALKPHOS 63  BILITOT 0.5   PT/INR No results for input(s): LABPROT, INR in the last 72 hours. Hepatitis Panel No results for input(s): HEPBSAG, HCVAB, HEPAIGM, HEPBIGM in the last 72 hours.  Dg Abd 2 Views  Result Date: 05/31/2016 CLINICAL DATA:  Crohn's disease. EXAM: ABDOMEN - 2 VIEW COMPARISON:  05/29/2016 . FINDINGS: Surgical clips right upper  quadrant. Surgical sutures right abdomen. Surgical clips left pelvis. Soft tissue structures are unremarkable. No bowel distention . No free air. Pelvic calcifications consistent phleboliths. Degenerative changes lumbar spine and both hips. Sclerotic changes right femoral head. Avascular necrosis cannot be excluded . IMPRESSION: 1.  No acute abnormality identified. 2. Sclerotic changes right femoral head. Avascular necrosis cannot be excluded. Electronically Signed   By: Marcello Moores  Register   On: 05/31/2016 13:38    Assessment / Plan:  1. Crohn's disease, maintained on Entyvio (next infusion beginning of May). Admitted with SBO vrs ileus. Symptoms substantially improved with solumedrol, NGT decompression -hopefully home soon. She is ambulating. I did talk to her about reducing to full liquids if solids are a little much for now. Would send home with anti-emetic -Home on prednisone 15m daily x 5 days, then decrease to 357mx 5 days, then 2046mShe should stay on 20 mg until I see her in office.   2. Hypokalemia, K+ back down to 3. Please replete K+ now, may need a few days of K+ supplementation at home until nutrition improves.   3. Thrombocytopenia, new. Platelets 323 >>> 160>>> 138 today.. Is this from Lovenox??  Principal Problem:   N&V (nausea and vomiting) Active Problems:   [place] Abdominal pain   Hypokalemia   Hyperglycemia   Encounter for nasogastric (NG) tube placement   SBO (small bowel obstruction)  Ileus ([place])   Small bowel obstruction   LOS: 9 days   Tye Savoy NP  06/02/2016, 10:41 AM  Pager number [phone]    Attending physician's note   I have taken an interval history, reviewed the chart and examined the patient. I agree with the Advanced Practitioner's note, impression and recommendations. Post hospitalization GI follow up with Tye Savoy, NP and ongoing GI follow up with Dr. Chriss Driver, MD Marval Regal [phone] Mon-Fri  8a-5p [phone] after 5p, weekends, holidays

## 2016-06-10 DIAGNOSIS — E876 Hypokalemia: Secondary | ICD-10-CM | POA: Diagnosis not present

## 2016-06-10 DIAGNOSIS — K50919 Crohn's disease, unspecified, with unspecified complications: Secondary | ICD-10-CM | POA: Diagnosis not present

## 2016-06-10 DIAGNOSIS — K56600 Partial intestinal obstruction, unspecified as to cause: Secondary | ICD-10-CM | POA: Diagnosis not present

## 2016-06-14 NOTE — Telephone Encounter (Signed)
Done

## 2016-06-16 ENCOUNTER — Ambulatory Visit (INDEPENDENT_AMBULATORY_CARE_PROVIDER_SITE_OTHER): Payer: BLUE CROSS/BLUE SHIELD | Admitting: Nurse Practitioner

## 2016-06-16 ENCOUNTER — Other Ambulatory Visit (INDEPENDENT_AMBULATORY_CARE_PROVIDER_SITE_OTHER): Payer: BLUE CROSS/BLUE SHIELD

## 2016-06-16 ENCOUNTER — Telehealth: Payer: Self-pay

## 2016-06-16 ENCOUNTER — Encounter: Payer: Self-pay | Admitting: Nurse Practitioner

## 2016-06-16 VITALS — BP 122/80 | HR 84 | Ht 67.0 in | Wt 167.0 lb

## 2016-06-16 DIAGNOSIS — K50919 Crohn's disease, unspecified, with unspecified complications: Secondary | ICD-10-CM

## 2016-06-16 DIAGNOSIS — E876 Hypokalemia: Secondary | ICD-10-CM | POA: Diagnosis not present

## 2016-06-16 LAB — BASIC METABOLIC PANEL
BUN: 20 mg/dL (ref 6–23)
CO2: 26 meq/L (ref 19–32)
Calcium: 9.4 mg/dL (ref 8.4–10.5)
Chloride: 102 mEq/L (ref 96–112)
Creatinine, Ser: 0.99 mg/dL (ref 0.40–1.20)
GFR: 61.46 mL/min (ref >60.00)
GLUCOSE: 127 mg/dL — AB (ref 70–99)
POTASSIUM: 4.4 meq/L (ref 3.5–5.1)
SODIUM: 138 meq/L (ref 135–145)

## 2016-06-16 LAB — SEDIMENTATION RATE: SED RATE: 33 mm/h — AB (ref 0–30)

## 2016-06-16 LAB — HIGH SENSITIVITY CRP: CRP, High Sensitivity: 1.54 mg/L (ref 0.000–5.000)

## 2016-06-16 LAB — PHOSPHORUS: Phosphorus: 2.9 mg/dL (ref 2.3–4.6)

## 2016-06-16 LAB — MAGNESIUM: MAGNESIUM: 1.7 mg/dL (ref 1.5–2.5)

## 2016-06-16 MED ORDER — OXYCODONE HCL 5 MG PO TABS: 5.0000 mg | ORAL_TABLET | Freq: Four times a day (QID) | ORAL | 0 refills | Status: DC | PRN

## 2016-06-16 MED ORDER — ONDANSETRON HCL 4 MG PO TABS: 4.0000 mg | ORAL_TABLET | Freq: Two times a day (BID) | ORAL | 0 refills | Status: DC

## 2016-06-16 MED ORDER — CYANOCOBALAMIN 1000 MCG/ML IJ SOLN: 1000.0000 ug | Freq: Once | INTRAMUSCULAR | 1 refills | Status: AC

## 2016-06-16 MED ORDER — PROMETHAZINE HCL 25 MG PO TABS: 25.0000 mg | ORAL_TABLET | Freq: Four times a day (QID) | ORAL | 0 refills | Status: DC | PRN

## 2016-06-16 MED ORDER — PREDNISONE 10 MG PO TABS: 30.0000 mg | ORAL_TABLET | Freq: Every day | ORAL | 0 refills | Status: DC

## 2016-06-16 NOTE — Patient Instructions (Addendum)
If you are age 57 or older, your body mass index should be between 23-30. Your Body mass index is 26.16 kg/m. If this is out of the aforementioned range listed, please consider follow up with your Primary Care Provider.  If you are age 80 or younger, your body mass index should be between 19-25. Your Body mass index is 26.16 kg/m. If this is out of the aformentioned range listed, please consider follow up with your Primary Care Provider.   Your physician has requested that you go to the basement for lab work before leaving today.  We have sent the following medications to your pharmacy for you to pick up at your convenience: B12 injection Prednisone Oxycodone Phenergan Zofran   We will contact you regarding an appointment for your CT enterography.  Follow up appointment for 06/30/16 at 11 00 am.  Thank you for choosing me and [facility].  Tye Savoy, NP

## 2016-06-16 NOTE — Progress Notes (Signed)
   HPI: Patient is a 55-year-old female followed by Dr. Nandigam for Crohn's disease. She is being treated with Entyvio. Patient was hospitalized for 9 days late March for nausea, vomiting and abdominal pain. CT scan of the abdomen and pelvis in the ED suggested enteritis vrs ileus vrs SBO with transition point in right upper pelvis. Patient was treated empirically with steroids (active Crohn's) and NG tube decompression. She complained of diarrhea, C-diff was negative. She was intolerant of PO intake for several days, a PICC line was placed and she received 2-3 days of TNA. Follow-up x-rays suggested an ileus. Even though radiographically she was improving, her nausea and abdominal pain persisted. We tried to obtain a CT enterography but patient did not feel she could tolerate the amount of contrast needed for the study so it was canceled. As far as the possible ileus, ,she was hypokalemic, potassium was repleted. Narcotics were reduced and patient was encouraged to ambulate. After several days she was able to tolerate solids. She was discharged on tapering dose of prednisone.     Past Medical History:  Diagnosis Date  . Arthritis   . Chronic diarrhea   . Chronic disease anemia   . Crohn's disease (HCC)   . Depression   . Family history of malignant neoplasm of gastrointestinal tract   . GERD (gastroesophageal reflux disease)   . H/O hiatal hernia   . History of adenomatous polyp of colon   . History of gastritis    AND HX ILEITIS  . History of kidney stones   . History of small bowel obstruction    SECONDARY CROHN'S STRICTURE  S/P COLECTOMY  . Hyperlipidemia   . Hypertension   . Hypopotassemia   . Internal hemorrhoids   . Kidney stones   . PONV (postoperative nausea and vomiting)   . Psoriasis    SKIN  . Right ureteral stone   . Rotavirus infection 05/31/2013  . S/P dilatation of esophageal stricture   . Seasonal asthma   . Urgency of urination   . Vitamin B12 deficiency      Patient's surgical history, family medical history, social history, medications and allergies were all reviewed in Epic    Physical Exam: BP 122/80   Pulse 84   Ht 5' 7" (1.702 m)   Wt 167 lb (75.8 kg)   BMI 26.16 kg/m   GENERAL: well developed white female in NAD PSYCH: :Pleasant, cooperative, normal affect HEENT: Normocephalic, conjunctiva pink, mucous membranes moist, neck supple without masses CARDIAC:  RRR, no murmur heard, no operipheral edema PULM: Normal respiratory effort, lungs CTA bilaterally, no wheezing ABDOMEN:  soft, nontender, nondistended, no obvious masses, no hepatomegaly,  normal bowel sounds SKIN:  turgor, no lesions seen Musculoskeletal:  Normal muscle tone, normal strength NEURO: Alert and oriented x 3, no focal neurologic deficits    ASSESSMENT and PLAN:  1. 55 yo  female with Crohn's disease, s/p 2 resections.. Maintained on Entyvio. Recently hospitalized with nausea, vomiting and abdominal pain. CT scan admission suggested possible enteritis versus focal ileus versus small bowel obstruction in the right abdomen. Treated with steroids. Follow-up plain films of the abdomen suggested more of an ileus picture . Patient was tolerate PO because of nausea and abdominal pain. Here for follow up. She was discharged on tapering dose of prednisone. She developed recurrent nausea and vomiting after reducing prednisone to 30mg daily. Vomiting has settled but not resolved.   -Patient needs a CT enterography. She refused this in the   hospital because of an inability to tolerate large volume contrast. Today discussed taking Reglan prior to the CTE but patient does not want to do this. She tells me that radiology has placed small bowel feeding tube for contrast in the past. She would like this done again . I willl contact Radiology to discuss this but mostly likely patient will need hospital admission for NGT placement for delivery of contrast  -Continue prednisone 30 mg for  now. -Next Entyvio infusion is May 1 -obtain CRP, ESR today -Per patient's request I will refill her Zofran and Phenergan for breakthrough nausea. . She sees pain management in [facility] but is out of her pain medications. Patient tells me she spoke with the pain management office today and they did ask that we give her some oxycodone to get her by until appointment there next Friday.. I  give her oxycodone #20.   2. Hypokalemia, low magnesium. -PCP has apparently increased her potassium dose. Will recheck potassium level today.  -Patient tells me her magnesium level was also low at PCP's office. She was advised to start OTC supplement but she does not know how much she should take. Advised her to start 400 g daily. Will repeat BMET and magnesium level in 7-10 days   Tye Savoy , NP 06/16/2016, 11:04 AM

## 2016-06-16 NOTE — Telephone Encounter (Signed)
Called patient to let her know that she needs to go for repeat labs on 06/23/16.  Pt agreed.

## 2016-06-17 ENCOUNTER — Other Ambulatory Visit: Payer: Self-pay

## 2016-06-17 DIAGNOSIS — K567 Ileus, unspecified: Secondary | ICD-10-CM

## 2016-06-17 DIAGNOSIS — K50118 Crohn's disease of large intestine with other complication: Secondary | ICD-10-CM

## 2016-06-17 DIAGNOSIS — Z8719 Personal history of other diseases of the digestive system: Secondary | ICD-10-CM

## 2016-06-17 DIAGNOSIS — K3184 Gastroparesis: Secondary | ICD-10-CM

## 2016-06-17 NOTE — Progress Notes (Signed)
Ct ente

## 2016-06-20 ENCOUNTER — Other Ambulatory Visit: Payer: Self-pay | Admitting: *Deleted

## 2016-06-20 MED ORDER — PANCRELIPASE (LIP-PROT-AMYL) 36000-114000 UNITS PO CPEP: ORAL_CAPSULE | ORAL | 3 refills | Status: DC

## 2016-06-20 NOTE — Progress Notes (Signed)
Refilled pts creon per fax request from pharmacy

## 2016-06-20 NOTE — Progress Notes (Signed)
Reviewed and agree with documentation and assessment and plan. K. Veena Nandigam , MD   

## 2016-06-21 ENCOUNTER — Ambulatory Visit (INDEPENDENT_AMBULATORY_CARE_PROVIDER_SITE_OTHER): Payer: BLUE CROSS/BLUE SHIELD | Admitting: Neurology

## 2016-06-21 ENCOUNTER — Encounter: Payer: Self-pay | Admitting: Neurology

## 2016-06-21 VITALS — BP 122/64 | HR 94 | Ht 67.0 in | Wt 166.1 lb

## 2016-06-21 DIAGNOSIS — R200 Anesthesia of skin: Secondary | ICD-10-CM | POA: Diagnosis not present

## 2016-06-21 DIAGNOSIS — G43109 Migraine with aura, not intractable, without status migrainosus: Secondary | ICD-10-CM | POA: Diagnosis not present

## 2016-06-21 DIAGNOSIS — R202 Paresthesia of skin: Secondary | ICD-10-CM | POA: Diagnosis not present

## 2016-06-21 NOTE — Progress Notes (Signed)
NEUROLOGY FOLLOW UP OFFICE NOTE  [name] [id number]  HISTORY OF PRESENT ILLNESS: [name] is a 56 year old right-handed female with Crohn's disease and hypertension who follows up for migraine.  She is accompanied by her husband who supplements history.  UPDATE: Headaches are improved. Intensity:  8/10 Duration: 15 minutes with Zomig NS Frequency:  3 migraines in past 3 months. Current NSAIDS:  Contraindicated (Crohn's disease) Current analgesics:  oxycodone 8m (for chronic back pain) Current triptans:  Zomig 552mNS (effective but expensive) Frequency of abortive medication:  infrequent Current anti-emetic:  promethazine 2523murrent muscle relaxants:  tizanidine 4mg76mrrent anti-anxiolytic:  no Current sleep aide:  no Current Antihypertensive medications:  amlodipine Current Antidepressant medications:  no Current Anticonvulsant medications:  gabapentin 600mg5mce daily, topiramate 50mg 37ment Vitamins/Herbal/Supplements:  B12 Current Antihistamines/Decongestants:  no Other therapy:  No Takes prednisone 30mg d6m for Crohn's   Caffeine:  Decaf tea Alcohol:  no Smoker:  no Diet:  Hydrates. Depression/anxiety:  no Sleep hygiene:  poor  To further evaluation episodes of visual hallucinations, she had an EEG performed on 03/28/16, which was normal.  She had one episode of hallucinations in which she saw clowns.    To assess gait instability, An MRI of the cervical spine was performed on 04/11/16, which was personally reviewed, and revealed a broad left paracentral disc protrusion at C5-6 and left uncovertebral degenerative changes with moderate left foraminal narrowing at C3-4, but no evidence of significant canal stenosis that would affect gait.  She reportedly had a NCV performed at her pain clinic.  It reportedly revealed an ulnar neuropathy.  Gabapentin was increased to 600mg tw17mdaily.   HISTORY: In September, she began having several new  symptoms. 1.  HEADACHE: Location:  Bi-temporal/parietal Quality:  Vice-like Intensity:  10/10 Aura:  No preceding aura, but on occasion has associated visual hallucinations of dead people or children in the house (has occurred with 3 headaches over past month). Prodrome:  no Associated symptoms:  nausea, vomiting, photophobia, phonophobia, sees spots in her vision.  She has not had any new worse headache of her life, waking up from sleep Duration:  Several hours Frequency:  Every other night. Triggers/exacerbating factors:  no Relieving factors:  no Activity:  aggravates   Past NSAIDS:  ibuprofen Past analgesics:  Excedrin, Tylenol Past abortive triptans:  Maxalt, Relpax, Sumatriptan 50mg (in1mctive) Past muscle relaxants:  no Past anti-emetic:  no Past antihypertensive medications:  no Past antidepressant medications:  no Past anticonvulsant medications:  Keppra Past vitamins/Herbal/Supplements:  no Past antihistamines/decongestants:  no Other past therapies:  no   Family history of headache:  No family history of migraine.  No personal history of migraine.   2.  GAIT INSTABILITY: On occasion, her legs will give out or she will just lose her balance.  She has had falls.  She denies headache or radicular pain down the legs.  PT was minimally helpful.   3.  LEFT ARM NUMBNESS: At times it feels like that her left arm suddenly "falls asleep" from shoulder down to hand and fingers.  There is no associated neck pain or radicular pain.  Often it occurs when sitting.  She needs to shake or rub her arm.  It resolves in about 30 minutes.  It occurs once a week.   4.  HALLUCINATIONS: In September 2017, she began seeing objects or people that are not there and one time her spouse saw her talking to somebody not there.  She had an MRI of the brain with and without contrast on 01/22/16, which demonstrated few nonspecific hyperintensities in the cerebral white matter clustered in the left  central hemipshere (new since prior image from 07/14/04) as well as patch signal abnormality in the pons (stable compared to prior imaging from 07/14/04), but no acute abnormalities.     She was being followed by neurology at [facility].  She is on Entyvio for Crohn's disease and they recommended LP to rule out PML.     Labs:  Sed Rate was elevated at 85 (has Crohn's disease) and B12 was low at 187.  RPR was nonreactive; TSH 1.206, free T3 2.58, T4 0.9; CBC with WBC 10.1, HGB 13.6, HCT 40.5, PLT 302 and ALC 2.6; CMP with Na 140, K 3.9, glucose 82, BUN 13, Cr 0.89, total bili 0.6, ALP 154, AST 21 and ALT 26.  PAST MEDICAL HISTORY: Past Medical History:  Diagnosis Date  . Arthritis   . Chronic diarrhea   . Chronic disease anemia   . Crohn's disease ([place])   . Depression   . Family history of malignant neoplasm of gastrointestinal tract   . GERD (gastroesophageal reflux disease)   . H/O hiatal hernia   . History of adenomatous polyp of colon   . History of gastritis    AND HX ILEITIS  . History of kidney stones   . History of small bowel obstruction    SECONDARY CROHN'S STRICTURE  S/P COLECTOMY  . Hyperlipidemia   . Hypertension   . Hypopotassemia   . Internal hemorrhoids   . Kidney stones   . PONV (postoperative nausea and vomiting)   . Psoriasis    SKIN  . Right ureteral stone   . Rotavirus infection 05/31/2013  . S/P dilatation of esophageal stricture   . Seasonal asthma   . Urgency of urination   . Vitamin B12 deficiency     MEDICATIONS: Current Outpatient Prescriptions on File Prior to Visit  Medication Sig Dispense Refill  . albuterol (PROAIR HFA) 108 (90 BASE) MCG/ACT inhaler Inhale 2 puffs into the lungs every 4 (four) hours as needed for wheezing or shortness of breath.     Marland Kitchen amLODipine (NORVASC) 10 MG tablet Take 10 mg by mouth daily.    . budesonide-formoterol (SYMBICORT) 80-4.5 MCG/ACT inhaler Inhale 2 puffs into the lungs daily as needed (SOB, wheezing).     . gabapentin  (NEURONTIN) 600 MG tablet Take 600-1,800 mg by mouth 3 (three) times daily. Takes 600 mg at lunch, 1200 mg at dinner, and 1800 mg at bedtime    . hydrocortisone (PROCTOCORT) 1 % CREA Use rectally as needed (Patient taking differently: Apply 1 application topically 3 (three) times daily as needed (for rectall pain). ) 1 Tube 3  . lipase/protease/amylase (CREON) 36000 UNITS CPEP capsule Take 2 caps with each meal 180 capsule 3  . Needles & Syringes MISC 1 Syringe by Does not apply route every 30 (thirty) days. 12 each 0  . omeprazole (PRILOSEC) 40 MG capsule Take 1 capsule (40 mg total) by mouth daily. 30 capsule 12  . ondansetron (ZOFRAN) 4 MG tablet Take 1 tablet (4 mg total) by mouth 2 (two) times daily. 40 tablet 0  . oxyCODONE (OXY IR/ROXICODONE) 5 MG immediate release tablet Take 1 tablet (5 mg total) by mouth every 6 (six) hours as needed for severe pain or breakthrough pain. 20 tablet 0  . predniSONE (DELTASONE) 10 MG tablet Take 3 tablets (30 mg total) by mouth  daily with breakfast. 90 tablet 0  . promethazine (PHENERGAN) 25 MG suppository Place 1 suppository (25 mg total) rectally every 6 (six) hours as needed for nausea or vomiting. 12 each 2  . promethazine (PHENERGAN) 25 MG tablet Take 1 tablet (25 mg total) by mouth every 6 (six) hours as needed for nausea or vomiting. 30 tablet 0  . tiZANidine (ZANAFLEX) 4 MG tablet Take 4 mg by mouth 3 (three) times daily.    Marland Kitchen topiramate (TOPAMAX) 50 MG tablet Take 1 tablet (50 mg total) by mouth at bedtime. 30 tablet 3  . Vedolizumab (ENTYVIO IV) Inject into the vein every 8 (eight) weeks.    Marland Kitchen zolmitriptan (ZOMIG) 5 MG nasal solution 1 spray in one nostril.  May repeat 1 spray once after 2 hours if needed. Not to exceed 2 sprays in 24 hours (Patient taking differently: Place 1 spray into the nose as needed for migraine. 1 spray in one nostril.  May repeat 1 spray once after 2 hours if needed. Not to exceed 2 sprays in 24 hours) 6 Units 0  . potassium  chloride SA (K-DUR,KLOR-CON) 20 MEQ tablet Take 2 tablets (40 mEq total) by mouth daily. 14 tablet 0   No current facility-administered medications on file prior to visit.     ALLERGIES: Allergies  Allergen Reactions  . Codeine Hives and Nausea And Vomiting  . Humira [Adalimumab] Rash    FAMILY HISTORY: Family History  Problem Relation Age of Onset  . Colon cancer Mother   . Colon polyps Mother   . Colon polyps Father   . Lung cancer Father   . Breast cancer Paternal Grandmother   . Colon polyps Brother   . Thyroid disease Neg Hx   . Stomach cancer Neg Hx   . Pancreatic cancer Neg Hx     SOCIAL HISTORY: Social History   Social History  . Marital status: Married    Spouse name: N/A  . Number of children: 2  . Years of education: N/A   Occupational History  . unemployed [employer]   Social History Main Topics  . Smoking status: Former Smoker    Packs/day: 1.00    Years: 10.00    Types: Cigarettes    Quit date: 03/07/1984  . Smokeless tobacco: Never Used  . Alcohol use No  . Drug use: No  . Sexual activity: Not on file   Other Topics Concern  . Not on file   Social History Narrative  . No narrative on file    REVIEW OF SYSTEMS: Constitutional: No fevers, chills, or sweats, no generalized fatigue, change in appetite Eyes: No visual changes, double vision, eye pain Ear, nose and throat: No hearing loss, ear pain, nasal congestion, sore throat Cardiovascular: No chest pain, palpitations Respiratory:  No shortness of breath at rest or with exertion, wheezes GastrointestinaI: No nausea, vomiting, diarrhea, abdominal pain, fecal incontinence Genitourinary:  No dysuria, urinary retention or frequency Musculoskeletal:  No neck pain, back pain Integumentary: No rash, pruritus, skin lesions Neurological: as above Psychiatric: No depression, insomnia, anxiety Endocrine: No palpitations, fatigue, diaphoresis, mood swings, change in appetite, change in weight,  increased thirst Hematologic/Lymphatic:  No purpura, petechiae. Allergic/Immunologic: no itchy/runny eyes, nasal congestion, recent allergic reactions, rashes  PHYSICAL EXAM: Vitals:   06/21/16 1124  BP: 122/64  Pulse: 94   General: No acute distress.  Patient appears well-groomed.  normal body habitus. Head:  Normocephalic/atraumatic Eyes:  Fundi examined but not visualized Neck: supple, no paraspinal tenderness,  full range of motion Heart:  Regular rate and rhythm Lungs:  Clear to auscultation bilaterally Back: No paraspinal tenderness Neurological Exam: alert and oriented to person, place, and time. Attention span and concentration intact, recent and remote memory intact, fund of knowledge intact.  Speech fluent and not dysarthric, language intact.  CN II-XII intact. Bulk and tone normal, muscle strength 5/5 throughout.  Sensation to temperature and vibration  intact.  Deep tendon reflexes 2+ throughout, toes downgoing  Finger to nose testing intact.  Gait normal, Romberg negative.  IMPRESSION: 1.  Migraines, improved 2.  Visual hallucinations, unclear etiology but infrequent 3.  Left arm numbness, secondary to either cervical radiculopathy (left disc protrusion noted on MRI) or mild carpal tunnel or ulnar neuropathy.  PLAN: 1.  Continue topiramate 30m at bedtime for headache prevention 2.  Use the Zomig nasal spray as needed for migraine attack 3.  Continue gabapentin for arm numbness. 4.  Follow up in 5 months.  AMetta Clines DO  CC: DJanie Morning DO

## 2016-06-21 NOTE — Patient Instructions (Signed)
1.  Continue topiramate 48m at bedtime for headache prevention 2.  Use the Zomig nasal spray as needed for migraine attack 3.  Continue gabapentin for arm numbness. 4.  Follow up in 5 months.

## 2016-06-22 DIAGNOSIS — R300 Dysuria: Secondary | ICD-10-CM | POA: Diagnosis not present

## 2016-06-23 DIAGNOSIS — M4696 Unspecified inflammatory spondylopathy, lumbar region: Secondary | ICD-10-CM | POA: Diagnosis not present

## 2016-06-23 DIAGNOSIS — I10 Essential (primary) hypertension: Secondary | ICD-10-CM | POA: Diagnosis not present

## 2016-06-23 DIAGNOSIS — F5102 Adjustment insomnia: Secondary | ICD-10-CM | POA: Diagnosis not present

## 2016-06-23 DIAGNOSIS — R300 Dysuria: Secondary | ICD-10-CM | POA: Diagnosis not present

## 2016-06-23 DIAGNOSIS — K50818 Crohn's disease of both small and large intestine with other complication: Secondary | ICD-10-CM | POA: Diagnosis not present

## 2016-06-23 DIAGNOSIS — M47816 Spondylosis without myelopathy or radiculopathy, lumbar region: Secondary | ICD-10-CM | POA: Diagnosis not present

## 2016-06-24 ENCOUNTER — Other Ambulatory Visit (INDEPENDENT_AMBULATORY_CARE_PROVIDER_SITE_OTHER): Payer: BLUE CROSS/BLUE SHIELD

## 2016-06-24 ENCOUNTER — Other Ambulatory Visit: Payer: Self-pay | Admitting: Nurse Practitioner

## 2016-06-24 DIAGNOSIS — K50019 Crohn's disease of small intestine with unspecified complications: Secondary | ICD-10-CM

## 2016-06-24 DIAGNOSIS — M5137 Other intervertebral disc degeneration, lumbosacral region: Secondary | ICD-10-CM | POA: Diagnosis not present

## 2016-06-24 DIAGNOSIS — M25559 Pain in unspecified hip: Secondary | ICD-10-CM | POA: Diagnosis not present

## 2016-06-24 DIAGNOSIS — G894 Chronic pain syndrome: Secondary | ICD-10-CM | POA: Diagnosis not present

## 2016-06-24 DIAGNOSIS — Z79899 Other long term (current) drug therapy: Secondary | ICD-10-CM | POA: Diagnosis not present

## 2016-06-24 DIAGNOSIS — M47817 Spondylosis without myelopathy or radiculopathy, lumbosacral region: Secondary | ICD-10-CM | POA: Diagnosis not present

## 2016-06-24 DIAGNOSIS — Z79891 Long term (current) use of opiate analgesic: Secondary | ICD-10-CM | POA: Diagnosis not present

## 2016-06-24 LAB — BASIC METABOLIC PANEL
BUN: 14 mg/dL (ref 6–23)
CHLORIDE: 106 meq/L (ref 96–112)
CO2: 26 mEq/L (ref 19–32)
Calcium: 8.8 mg/dL (ref 8.4–10.5)
Creatinine, Ser: 0.76 mg/dL (ref 0.40–1.20)
GFR: 83.38 mL/min (ref >60.00)
Glucose, Bld: 111 mg/dL — ABNORMAL HIGH (ref 70–99)
POTASSIUM: 3.4 meq/L — AB (ref 3.5–5.1)
Sodium: 140 mEq/L (ref 135–145)

## 2016-06-24 LAB — MAGNESIUM: MAGNESIUM: 1.4 mg/dL — AB (ref 1.5–2.5)

## 2016-06-29 ENCOUNTER — Other Ambulatory Visit: Payer: Self-pay

## 2016-06-29 DIAGNOSIS — K50018 Crohn's disease of small intestine with other complication: Secondary | ICD-10-CM

## 2016-06-30 ENCOUNTER — Encounter: Payer: Self-pay | Admitting: Nurse Practitioner

## 2016-06-30 ENCOUNTER — Ambulatory Visit (INDEPENDENT_AMBULATORY_CARE_PROVIDER_SITE_OTHER): Payer: BLUE CROSS/BLUE SHIELD | Admitting: Nurse Practitioner

## 2016-06-30 ENCOUNTER — Telehealth: Payer: Self-pay | Admitting: Nurse Practitioner

## 2016-06-30 ENCOUNTER — Other Ambulatory Visit (INDEPENDENT_AMBULATORY_CARE_PROVIDER_SITE_OTHER): Payer: BLUE CROSS/BLUE SHIELD

## 2016-06-30 VITALS — BP 126/66 | HR 84 | Ht 67.0 in | Wt 178.8 lb

## 2016-06-30 DIAGNOSIS — K50919 Crohn's disease, unspecified, with unspecified complications: Secondary | ICD-10-CM | POA: Diagnosis not present

## 2016-06-30 DIAGNOSIS — E876 Hypokalemia: Secondary | ICD-10-CM

## 2016-06-30 LAB — BASIC METABOLIC PANEL
BUN: 20 mg/dL (ref 6–23)
CHLORIDE: 102 meq/L (ref 96–112)
CO2: 30 mEq/L (ref 19–32)
Calcium: 9.6 mg/dL (ref 8.4–10.5)
Creatinine, Ser: 1 mg/dL (ref 0.40–1.20)
GFR: 60.74 mL/min (ref >60.00)
Glucose, Bld: 107 mg/dL — ABNORMAL HIGH (ref 70–99)
Potassium: 4.7 mEq/L (ref 3.5–5.1)
SODIUM: 139 meq/L (ref 135–145)

## 2016-06-30 LAB — MAGNESIUM: MAGNESIUM: 1.5 mg/dL (ref 1.5–2.5)

## 2016-06-30 MED ORDER — OMEPRAZOLE 40 MG PO CPDR: 40.0000 mg | DELAYED_RELEASE_CAPSULE | Freq: Every day | ORAL | 12 refills | Status: DC

## 2016-06-30 NOTE — Patient Instructions (Addendum)
If you are age 57 or older, your body mass index should be between 23-30. Your Body mass index is 28 kg/m. If this is out of the aforementioned range listed, please consider follow up with your Primary Care Provider.  If you are age 56 or younger, your body mass index should be between 19-25. Your Body mass index is 28 kg/m. If this is out of the aformentioned range listed, please consider follow up with your Primary Care Provider.   Your physician has requested that you go to the basement for the following lab work before leaving today: BMET Magnesium level  DECREASE Prednisone to 25 mg daily.  Please call your insurance company and find out which pancreatic enzyme is formulary.  Samples given today of Creon #48.  Thank you for choosing me and [facility].  Tye Savoy, NP

## 2016-07-03 NOTE — Progress Notes (Signed)
HPI: Patient is a 56 yo female followed by Dr. Silverio Decamp for Crohn's, maintained on Entyvio. Patient was hospitalized for 9 days late March for nausea, vomiting and abdominal pain. CT scan of the abdomen and pelvis suggested enteritis vrs ileus vrs SBO with transition point in right upper pelvis. Patient was treated empirically with steroids (? active Crohn's) and NG tube decompression. She complained of diarrhea, C-diff was negative. She was intolerant of PO intake for several days, a PICC line was placed and she received 2-3 days of TNA. Follow-up x-rays suggested an ileus. Even though radiographically she was improving, her nausea and abdominal pain persisted. We tried to obtain a CT enterography but patient did not feel she could tolerate the amount of contrast needed for the study so it was canceled. As far as the possible ileus, , hypokalemic was corrected, narcotics  reduced and patient encouraged to ambulate. After several days she was able to tolerate solids. She was discharged on tapering dose of prednisone. I saw her in follow up on 4/12. She was overall better but started vomiting after reduction of prednisone from 40 mg to 30 mg daily. Plan was to arrange for NGT placement to deliver contrast for a CT enterograpy. Unfortunately, despiite many calls to Radiology we have been unable to get this accomplished. We thought about admitting for observatoin but were concerned that she wouldn't meet criteria and incur hospital charge. She has continued to vomit about every other day. She has persistent lower abdominal pain. Stools are still frequent. Essentially, there is been no improvement since I saw her 2 weeks ago   Past Medical History:  Diagnosis Date  . Arthritis   . Chronic diarrhea   . Chronic disease anemia   . Crohn's disease ([place])   . Depression   . Family history of malignant neoplasm of gastrointestinal tract   . GERD (gastroesophageal reflux disease)   . H/O hiatal hernia   .  History of adenomatous polyp of colon   . History of gastritis    AND HX ILEITIS  . History of kidney stones   . History of small bowel obstruction    SECONDARY CROHN'S STRICTURE  S/P COLECTOMY  . Hyperlipidemia   . Hypertension   . Hypopotassemia   . Internal hemorrhoids   . Kidney stones   . Pancreatic insufficiency   . PONV (postoperative nausea and vomiting)   . Psoriasis    SKIN  . Right ureteral stone   . Rotavirus infection 05/31/2013  . S/P dilatation of esophageal stricture   . Seasonal asthma   . Urgency of urination   . Vitamin B12 deficiency     Patient's surgical history, family medical history, social history, medications and allergies were all reviewed in Epic    Physical Exam: BP 126/66   Pulse 84   Ht 5' 7"  (1.702 m)   Wt 178 lb 12.8 oz (81.1 kg)   BMI 28.00 kg/m   GENERAL: pleasant white female in NAD. Husband present PSYCH: :Pleasant, cooperative, normal affect HEENT: conjunctiva pink, mucous membranes moist, neck supple without masses CARDIAC:  RRR, no murmur heard, no peripheral edema PULM: Normal respiratory effort, lungs CTA bilaterally, no wheezing ABDOMEN:  soft, mildly distended with tympany,  no obvious masses, no hepatomegaly,  normal bowel sounds SKIN:  turgor, no lesions seen Musculoskeletal:  Normal muscle tone, normal strength NEURO: Alert and oriented x 3, no focal neurologic deficits  ASSESSMENT and PLAN:  1. 56 yo female with  Crohn's, s/p small bowel resection x2 . Maintained on Entyvio. Focal active ileitis on last colonoscopy in March 2016. Continues to have abdominal pain, nausea / vomiting for which she was recently hospitalized. Imaging suggest SBO vrs ileus though there was transition point in right abdomen. Symptoms improved but never resolved with IV steroids. She has been unable to taper prednisone from 30 mg due to ongoing symptoms.  -We have been unable to get a CT enterography due to her inability to tolerate the large  volume contrast. Offered Reglan but due to past experiences she apparently couldn't tolerate near enough contrast. We are awaiting call back from [facility] to see if NGT can be placed at time of CT enterography which is scheduled mid May. -Ideally we would like her off steroids as soon as possible. She will reduce dose to 2m daily.    Hypokalemia / hypomagnesia. On replacement.  -Will recheck labs today.      PTye Savoy, NP 07/03/2016, 11:49 PM

## 2016-07-04 NOTE — Telephone Encounter (Signed)
Please advise on strength and dose.  Thanks Peter Congo

## 2016-07-05 ENCOUNTER — Encounter (HOSPITAL_COMMUNITY)
Admission: RE | Admit: 2016-07-05 | Discharge: 2016-07-05 | Disposition: A | Discharge status: Home / Self Care | Payer: BLUE CROSS/BLUE SHIELD | Source: Ambulatory Visit | Attending: Gastroenterology | Admitting: Gastroenterology

## 2016-07-05 ENCOUNTER — Encounter (HOSPITAL_COMMUNITY): Payer: Self-pay

## 2016-07-05 ENCOUNTER — Other Ambulatory Visit: Payer: Self-pay

## 2016-07-05 DIAGNOSIS — K50119 Crohn's disease of large intestine with unspecified complications: Secondary | ICD-10-CM | POA: Insufficient documentation

## 2016-07-05 DIAGNOSIS — K5010 Crohn's disease of large intestine without complications: Secondary | ICD-10-CM

## 2016-07-05 MED ORDER — VEDOLIZUMAB 300 MG IV SOLR
300.0000 mg | Freq: Once | INTRAVENOUS | Status: AC
  Administered 2016-07-05: 300 mg via INTRAVENOUS
  Filled 2016-07-05: qty 5

## 2016-07-05 MED ORDER — SODIUM CHLORIDE 0.9 % IV SOLN
INTRAVENOUS | Status: DC
Start: 2016-07-05 — End: 2016-07-06
  Administered 2016-07-05: 10:00:00 via INTRAVENOUS

## 2016-07-05 NOTE — Telephone Encounter (Signed)
Beth,  She needs 40, 000 , take 2 with meals and one with snacks. Thanks

## 2016-07-06 ENCOUNTER — Telehealth: Payer: Self-pay

## 2016-07-06 MED ORDER — PANCRELIPASE (LIP-PROT-AMYL) 40000-126000 UNITS PO CPEP: 1.0000 | ORAL_CAPSULE | ORAL | 3 refills | Status: DC

## 2016-07-06 NOTE — Telephone Encounter (Signed)
Pharmacy called wanting to clarify Zenpep Rx. Quantity prescribed was #210 which is not enough if pt is taking 2 with each meal and one with snacks. Instructed pharmacy to change to #240

## 2016-07-06 NOTE — Telephone Encounter (Signed)
Zenpep sent to pharmacy.

## 2016-07-06 NOTE — Addendum Note (Signed)
Addended by: Candie Mile on: 07/06/2016 01:45 PM   Modules accepted: Orders

## 2016-07-07 ENCOUNTER — Other Ambulatory Visit: Payer: Self-pay | Admitting: Nurse Practitioner

## 2016-07-08 ENCOUNTER — Encounter: Payer: Self-pay | Admitting: *Deleted

## 2016-07-12 NOTE — Progress Notes (Signed)
Reviewed and agree with documentation and assessment and plan. K. Veena Nandigam , MD   

## 2016-07-19 ENCOUNTER — Observation Stay (HOSPITAL_COMMUNITY): Payer: BLUE CROSS/BLUE SHIELD

## 2016-07-19 ENCOUNTER — Ambulatory Visit (HOSPITAL_COMMUNITY)
Admission: RE | Admit: 2016-07-19 | Discharge: 2016-07-19 | Disposition: A | Discharge status: Home / Self Care | Payer: BLUE CROSS/BLUE SHIELD | Source: Ambulatory Visit | Attending: Nurse Practitioner | Admitting: Nurse Practitioner

## 2016-07-19 ENCOUNTER — Observation Stay (HOSPITAL_COMMUNITY)
Admission: AD | Admit: 2016-07-19 | Discharge: 2016-07-19 | Disposition: A | Discharge status: Home / Self Care | Payer: BLUE CROSS/BLUE SHIELD | Source: Ambulatory Visit | Attending: Gastroenterology | Admitting: Gastroenterology

## 2016-07-19 ENCOUNTER — Ambulatory Visit (INDEPENDENT_AMBULATORY_CARE_PROVIDER_SITE_OTHER): Payer: Self-pay | Admitting: Nurse Practitioner

## 2016-07-19 ENCOUNTER — Encounter (HOSPITAL_COMMUNITY): Payer: Self-pay

## 2016-07-19 ENCOUNTER — Encounter: Payer: Self-pay | Admitting: Nurse Practitioner

## 2016-07-19 VITALS — BP 132/68 | HR 66 | Ht 67.0 in | Wt 188.0 lb

## 2016-07-19 DIAGNOSIS — K5090 Crohn's disease, unspecified, without complications: Secondary | ICD-10-CM | POA: Diagnosis not present

## 2016-07-19 DIAGNOSIS — Z431 Encounter for attention to gastrostomy: Principal | ICD-10-CM | POA: Insufficient documentation

## 2016-07-19 DIAGNOSIS — K50012 Crohn's disease of small intestine with intestinal obstruction: Secondary | ICD-10-CM

## 2016-07-19 DIAGNOSIS — Z4682 Encounter for fitting and adjustment of non-vascular catheter: Secondary | ICD-10-CM | POA: Diagnosis not present

## 2016-07-19 DIAGNOSIS — R112 Nausea with vomiting, unspecified: Secondary | ICD-10-CM | POA: Diagnosis not present

## 2016-07-19 DIAGNOSIS — Z79899 Other long term (current) drug therapy: Secondary | ICD-10-CM | POA: Insufficient documentation

## 2016-07-19 DIAGNOSIS — K50919 Crohn's disease, unspecified, with unspecified complications: Secondary | ICD-10-CM

## 2016-07-19 DIAGNOSIS — Z87891 Personal history of nicotine dependence: Secondary | ICD-10-CM | POA: Insufficient documentation

## 2016-07-19 DIAGNOSIS — Z0189 Encounter for other specified special examinations: Secondary | ICD-10-CM

## 2016-07-19 DIAGNOSIS — K50118 Crohn's disease of large intestine with other complication: Secondary | ICD-10-CM

## 2016-07-19 DIAGNOSIS — K3184 Gastroparesis: Secondary | ICD-10-CM

## 2016-07-19 DIAGNOSIS — R1031 Right lower quadrant pain: Secondary | ICD-10-CM | POA: Diagnosis not present

## 2016-07-19 DIAGNOSIS — I10 Essential (primary) hypertension: Secondary | ICD-10-CM | POA: Diagnosis not present

## 2016-07-19 DIAGNOSIS — R1110 Vomiting, unspecified: Secondary | ICD-10-CM | POA: Diagnosis not present

## 2016-07-19 DIAGNOSIS — Z8719 Personal history of other diseases of the digestive system: Secondary | ICD-10-CM

## 2016-07-19 DIAGNOSIS — K567 Ileus, unspecified: Secondary | ICD-10-CM

## 2016-07-19 MED ORDER — ACETAMINOPHEN 325 MG PO TABS
650.0000 mg | ORAL_TABLET | Freq: Once | ORAL | Status: AC
  Administered 2016-07-19: 650 mg via ORAL
  Filled 2016-07-19: qty 2

## 2016-07-19 MED ORDER — ONDANSETRON HCL 4 MG/2ML IJ SOLN
4.0000 mg | Freq: Four times a day (QID) | INTRAMUSCULAR | Status: DC | PRN
  Administered 2016-07-19: 4 mg via INTRAVENOUS
  Filled 2016-07-19: qty 2

## 2016-07-19 MED ORDER — SODIUM CHLORIDE 0.9% FLUSH: 3.0000 mL | INTRAVENOUS | Status: DC | PRN

## 2016-07-19 MED ORDER — BARIUM SULFATE 0.1 % PO SUSP
ORAL | Status: AC
  Administered 2016-07-19: 1 mL
  Filled 2016-07-19: qty 3

## 2016-07-19 MED ORDER — ACETAMINOPHEN 325 MG PO TABS: 650.0000 mg | ORAL_TABLET | Freq: Four times a day (QID) | ORAL | Status: DC | PRN

## 2016-07-19 MED ORDER — TRAZODONE HCL 50 MG PO TABS: 25.0000 mg | ORAL_TABLET | Freq: Every evening | ORAL | Status: DC | PRN

## 2016-07-19 MED ORDER — SODIUM CHLORIDE 0.9 % IV SOLN: 250.0000 mL | INTRAVENOUS | Status: DC | PRN

## 2016-07-19 MED ORDER — ACETAMINOPHEN 650 MG RE SUPP: 650.0000 mg | Freq: Four times a day (QID) | RECTAL | Status: DC | PRN

## 2016-07-19 MED ORDER — ONDANSETRON HCL 4 MG PO TABS: 4.0000 mg | ORAL_TABLET | Freq: Four times a day (QID) | ORAL | Status: DC | PRN

## 2016-07-19 MED ORDER — PROMETHAZINE HCL 25 MG RE SUPP
25.0000 mg | Freq: Four times a day (QID) | RECTAL | Status: DC | PRN
  Filled 2016-07-19: qty 1

## 2016-07-19 MED ORDER — SODIUM CHLORIDE 0.9% FLUSH
3.0000 mL | Freq: Two times a day (BID) | INTRAVENOUS | Status: DC
  Administered 2016-07-19: 3 mL via INTRAVENOUS

## 2016-07-19 MED ORDER — ONDANSETRON HCL 4 MG PO TABS: 4.0000 mg | ORAL_TABLET | Freq: Once | ORAL | Status: AC

## 2016-07-19 MED ORDER — IOPAMIDOL (ISOVUE-300) INJECTION 61%
INTRAVENOUS | Status: AC
  Administered 2016-07-19: 100 mL
  Filled 2016-07-19: qty 100

## 2016-07-19 MED ORDER — ONDANSETRON HCL 4 MG/2ML IJ SOLN: 4.0000 mg | Freq: Four times a day (QID) | INTRAMUSCULAR | Status: DC | PRN

## 2016-07-19 MED ORDER — PREDNISONE 10 MG PO TABS: 25.0000 mg | ORAL_TABLET | Freq: Every day | ORAL | 0 refills | Status: DC

## 2016-07-19 NOTE — Progress Notes (Signed)
CM consult for shower chair. This CM met with pt at bedside about request for shower chair. Pt clarified that she was looking at a shower chair online that she wanted to purchase. This CM mentioned that she could get a script from her MD and take it to the [facility] retail store for one. This CM also mentioned that she could use the 3in1 that she has at home in the shower as well. Pt appreciative of CM assistance. Marney Doctor RN,BSN,NCM [phone]

## 2016-07-19 NOTE — Discharge Summary (Signed)
[facility] Discharge Summary  Name: [name] MRN: [id number] DOB: 07-23-1960 56 y.o. PCP:  Rochel Brome, MD  Date of Admission: 07/19/2016  9:51 AM Date of Discharge: 07/19/2016 Attending Physician: Mauri Pole, MD  Discharge Diagnosis: Active Problems:   Crohn's disease (regional enteritis) [facility])  Consultations:  None  Procedures Performed:  Dg Abd 1 View  Result Date: 07/19/2016 CLINICAL DATA:  Assess nasogastric tube positioning. EXAM: ABDOMEN - 1 VIEW COMPARISON:  KUB of May 31, 2016 FINDINGS: The tip of the nasogastric tube projects in the proximal gastric body. The proximal port projects at or below the GE junction. The bowel gas pattern is within the limits of normal. No free extraluminal gas collections are observed. There are no abnormal soft tissue calcifications. There surgical clips in the gallbladder fossa and projecting over the left sacral ala. There degenerative disc changes of the mid lumbar spine. IMPRESSION: Advancement of the nasogastric tube by 5-10 cm would assure that the proximal port remains below the GE junction. Electronically Signed   By: David  Martinique M.D.   On: 07/19/2016 12:11    GI Procedures:  None  History/Physical Exam:  See Admission H&P  Admission HPI:  Patient was admitted in order to receive NGT placement to administer contrast for CT enterography as patient cannot drink large volume contrast.  Patient tolerated the study well and was asking to eat when she returned to her room from radiology.  Patient will be discharged home.  Results of study can be given as an outpatient by the ordering clinician.  Hospital Course by problem list: Active Problems:   Crohn's disease (regional enteritis) ([place])   Discharge Vitals:  BP (!) 143/79 (BP Location: Left Arm) Comment: informed RN  Pulse 79   Temp 98.2 F (36.8 C) (Oral)   Resp 16   Ht 5' 7"  (1.702 m)   Wt 188 lb 3.2 oz (85.4 kg)   SpO2 98%   BMI 29.48 kg/m     Discharge Labs: No results found for this or any previous visit (from the past 24 hour(s)).  Disposition and follow-up:   Ms.[name] was discharged from [facility] in stable condition.    Discharge Medications: Allergies as of 07/19/2016      Reactions   Codeine Hives, Nausea And Vomiting   Humira [adalimumab] Rash      Medication List    TAKE these medications   amLODipine 10 MG tablet Commonly known as:  NORVASC Take 10 mg by mouth daily.   ENTYVIO IV Inject into the vein every 8 (eight) weeks. Receives at [place].   gabapentin 600 MG tablet Commonly known as:  NEURONTIN Take 600-1,800 mg by mouth 3 (three) times daily. Takes 600 mg at lunch, 1200 mg at dinner, and 1800 mg at bedtime   hydrocortisone 1 % Crea Commonly known as:  PROCTOCORT Use rectally as needed What changed:  how much to take  how to take this  when to take this  reasons to take this  additional instructions   Needles & Syringes Misc 1 Syringe by Does not apply route every 30 (thirty) days.   omeprazole 40 MG capsule Commonly known as:  PRILOSEC Take 1 capsule (40 mg total) by mouth daily.   ondansetron 4 MG tablet Commonly known as:  ZOFRAN Take 1 tablet (4 mg total) by mouth 2 (two) times daily.   Oxycodone HCl 10 MG Tabs Take 10 mg by mouth See admin  instructions. 1 tablet up to five times a day as needed for severe pain.   Pancrelipase (Lip-Prot-Amyl) 40000-126000 units Cpep Commonly known as:  ZENPEP Take 1 capsule by mouth as directed. Take 2 capsules with meals and 1 capsule with snacks. What changed:  how much to take  when to take this  additional instructions   predniSONE 10 MG tablet Commonly known as:  DELTASONE Take 2.5 tablets (25 mg total) by mouth daily with breakfast.   PROAIR HFA 108 (90 Base) MCG/ACT inhaler Generic drug:  albuterol Inhale 2 puffs into the lungs every 4 (four) hours as needed for wheezing or shortness of  breath.   promethazine 25 MG suppository Commonly known as:  PHENERGAN Place 1 suppository (25 mg total) rectally every 6 (six) hours as needed for nausea or vomiting.   promethazine 25 MG tablet Commonly known as:  PHENERGAN TAKE 1 TABLET BY MOUTH EVERY 6 HOURS AS NEEDED FOR NAUSEA AND VOMITING   SYMBICORT 80-4.5 MCG/ACT inhaler Generic drug:  budesonide-formoterol Inhale 2 puffs into the lungs daily as needed (SOB, wheezing).   tiZANidine 4 MG tablet Commonly known as:  ZANAFLEX Take 4 mg by mouth 3 (three) times daily.   topiramate 50 MG tablet Commonly known as:  TOPAMAX Take 1 tablet (50 mg total) by mouth at bedtime.   VITAMIN B-12 IJ Inject 1,000 mcg into the muscle every 30 (thirty) days.   zolmitriptan 5 MG nasal solution Commonly known as:  ZOMIG 1 spray in one nostril.  May repeat 1 spray once after 2 hours if needed. Not to exceed 2 sprays in 24 hours What changed:  how much to take  how to take this  when to take this  reasons to take this  additional instructions       Signed: Rafeef Lau D. 07/19/2016, 4:07 PM

## 2016-07-19 NOTE — Patient Instructions (Signed)
If you are age 57 or older, your body mass index should be between 23-30. Your Body mass index is 29.44 kg/m. If this is out of the aforementioned range listed, please consider follow up with your Primary Care Provider.  If you are age 94 or younger, your body mass index should be between 19-25. Your Body mass index is 29.44 kg/m. If this is out of the aformentioned range listed, please consider follow up with your Primary Care Provider.   We have sent the following medications to your pharmacy for you to pick up at your convenience: Prednisone  You are being admitted today for NG tube.  Thank you for choosing me and [facility].   Tye Savoy, NP

## 2016-07-19 NOTE — Progress Notes (Signed)
Primary Care Physician:  Rochel Brome, MD Primary Gastroenterologist:  Harl Bowie,  MD  Reason for Admission:  Crohn's Disease, nausea, vomiting, abdominal pain   ASSESSMENT AND PLAN:   74.  56 yo female with Crohn's disease of small and large bowel, s/p terminal ileal resection in 2008 and 2012. She is on Entyvio and has steroid dependence as well. Last colonoscopy with biopsies March 2016 revealed focal active ileitis. She has frequent nausea, vomiting and RLQ pain with recent CTscan suggesting partial SBO vrs ileus vrs enteritis. Patient needs a CT enterography for further evaluation but it not able to tolerate the large volume contrast needed for the study.  -Patient will be admitted to [facility] for NGT placement for contrast administration CT enterography to be done today.  -Anticipate discharge later this evening. No need to give home meds, she can take them once discharged.   2. Pancreatic insufficiency. On pancreatic enzyme replacement.     HPI: [name] is a 56 y.o. female with Crohn's disease, she is s/p terminal ileal resection in 2012. She was previously treated with Remicade and Humira but given lack of response and worsening of her psoriasis she was started on Entyvio in June 2016. She has been steroid dependent as well.   Patient was hospitalized late March for nausea, vomiting, and abdominal pain. Imaging suggested partial SBO vrs ileus vrs enteritis. Please see my last office note for details regarding that admission. She has continued to have frequent nausea, vomiting, and RLQ pain. The RLQ pain is constant, worse with eating, unchanged by BMs. Her bowels are always loose, several times a day. Her weight is up 11 pounds in last few weeks. She needs a CT enterography but cannot tolerate the large volume bowel prep. She has required NGT placement for administration of contrast in previous times. Since discharge patient has been able to reduce prednisone from 40 mg to 30 mg  without worsening symptoms.    Past Medical History:  Diagnosis Date  . Arthritis   . Chronic diarrhea   . Chronic disease anemia   . Crohn's disease ([place])   . Depression   . Family history of malignant neoplasm of gastrointestinal tract   . GERD (gastroesophageal reflux disease)   . H/O hiatal hernia   . History of adenomatous polyp of colon   . History of gastritis    AND HX ILEITIS  . History of kidney stones   . History of small bowel obstruction    SECONDARY CROHN'S STRICTURE  S/P COLECTOMY  . Hyperlipidemia   . Hypertension   . Hypopotassemia   . Internal hemorrhoids   . Kidney stones   . Pancreatic insufficiency   . PONV (postoperative nausea and vomiting)   . Psoriasis    SKIN  . Right ureteral stone   . Rotavirus infection 05/31/2013  . S/P dilatation of esophageal stricture   . Seasonal asthma   . Urgency of urination   . Vitamin B12 deficiency     Past Surgical History:  Procedure Laterality Date  . ANAL FISSURE REPAIR  1990's  . CARPAL TUNNEL RELEASE Right 2000  . CHOLECYSTECTOMY  2002  . CYSTOSCOPY WITH RETROGRADE PYELOGRAM, URETEROSCOPY AND STENT PLACEMENT Left 03/08/2013   Procedure: CYSTOSCOPY WITH RETROGRADE PYELOGRAM, URETEROSCOPY AND STENT PLACEMENT stone basketing;  Surgeon: Alexis Frock, MD;  Location: [facility];  Service: Urology;  Laterality: Left;  . CYSTOSCOPY WITH RETROGRADE PYELOGRAM, URETEROSCOPY AND STENT PLACEMENT Right 04/03/2013   Procedure: CYSTOSCOPY WITH RETROGRADE  PYELOGRAM, URETEROSCOPY AND STENT PLACEMENT;  Surgeon: Alexis Frock, MD;  Location: [facility];  Service: Urology;  Laterality: Right;  . DX LAPAROSCOPY CONVERTED TO OPEN RIGHT COLECCTOMY  09-15-2010   ANASTOMOTIC STRICTURE  . LAPAROSCOPIC ILEOCECECTOMY  10-09-2008   AND APPENDECTOMY (TERMINAL ILEITIS & PARTIAL SBO)  . VAGINAL HYSTERECTOMY  1992    Prior to Admission medications   Medication Sig Start Date End Date Taking? Authorizing Provider  albuterol  (PROAIR HFA) 108 (90 BASE) MCG/ACT inhaler Inhale 2 puffs into the lungs every 4 (four) hours as needed for wheezing or shortness of breath.    Yes [provider]  amLODipine (NORVASC) 10 MG tablet Take 10 mg by mouth daily.   Yes [provider]  budesonide-formoterol (SYMBICORT) 80-4.5 MCG/ACT inhaler Inhale 2 puffs into the lungs daily as needed (SOB, wheezing).    Yes [provider]  gabapentin (NEURONTIN) 600 MG tablet Take 600-1,800 mg by mouth 3 (three) times daily. Takes 600 mg at lunch, 1200 mg at dinner, and 1800 mg at bedtime   Yes [provider]  hydrocortisone (PROCTOCORT) 1 % CREA Use rectally as needed Patient taking differently: Apply 1 application topically 3 (three) times daily as needed (for rectall pain).  09/23/15  Yes Nandigam, Venia Minks, MD  Needles & Syringes MISC 1 Syringe by Does not apply route every 30 (thirty) days. 01/02/15  Yes Nandigam, Venia Minks, MD  omeprazole (PRILOSEC) 40 MG capsule Take 1 capsule (40 mg total) by mouth daily. 06/30/16  Yes Willia Craze, NP  ondansetron (ZOFRAN) 4 MG tablet Take 1 tablet (4 mg total) by mouth 2 (two) times daily. 06/16/16  Yes Willia Craze, NP  oxyCODONE (OXY IR/ROXICODONE) 5 MG immediate release tablet Take 1 tablet (5 mg total) by mouth every 6 (six) hours as needed for severe pain or breakthrough pain. 06/16/16  Yes Willia Craze, NP  Pancrelipase, Lip-Prot-Amyl, (ZENPEP) 40000-126000 units CPEP Take 1 capsule by mouth as directed. Take 2 capsules with meals and 1 capsule with snacks. 07/06/16  Yes Willia Craze, NP  predniSONE (DELTASONE) 10 MG tablet Take 3 tablets (30 mg total) by mouth daily with breakfast. Patient taking differently: Take 25 mg by mouth daily with breakfast.  06/16/16  Yes Willia Craze, NP  promethazine (PHENERGAN) 25 MG suppository Place 1 suppository (25 mg total) rectally every 6 (six) hours as needed for nausea or vomiting. 09/14/15  Yes Nandigam,  Venia Minks, MD  promethazine (PHENERGAN) 25 MG tablet TAKE 1 TABLET BY MOUTH EVERY 6 HOURS AS NEEDED FOR NAUSEA AND VOMITING 07/15/16  Yes Nandigam, Venia Minks, MD  tiZANidine (ZANAFLEX) 4 MG tablet Take 4 mg by mouth 3 (three) times daily.   Yes [provider]  topiramate (TOPAMAX) 50 MG tablet Take 1 tablet (50 mg total) by mouth at bedtime. 03/21/16  Yes Jaffe, Adam R, DO  Vedolizumab (ENTYVIO IV) Inject into the vein every 8 (eight) weeks.   Yes [provider]  zolmitriptan (ZOMIG) 5 MG nasal solution 1 spray in one nostril.  May repeat 1 spray once after 2 hours if needed. Not to exceed 2 sprays in 24 hours Patient taking differently: Place 1 spray into the nose as needed for migraine. 1 spray in one nostril.  May repeat 1 spray once after 2 hours if needed. Not to exceed 2 sprays in 24 hours 04/11/16  Yes Jaffe, Adam R, DO  potassium chloride SA (K-DUR,KLOR-CON) 20 MEQ tablet Take 2  tablets (40 mEq total) by mouth daily. 06/03/16 06/17/16  Damita Lack, MD    Current Outpatient Prescriptions  Medication Sig Dispense Refill  . albuterol (PROAIR HFA) 108 (90 BASE) MCG/ACT inhaler Inhale 2 puffs into the lungs every 4 (four) hours as needed for wheezing or shortness of breath.     Marland Kitchen amLODipine (NORVASC) 10 MG tablet Take 10 mg by mouth daily.    . budesonide-formoterol (SYMBICORT) 80-4.5 MCG/ACT inhaler Inhale 2 puffs into the lungs daily as needed (SOB, wheezing).     . gabapentin (NEURONTIN) 600 MG tablet Take 600-1,800 mg by mouth 3 (three) times daily. Takes 600 mg at lunch, 1200 mg at dinner, and 1800 mg at bedtime    . hydrocortisone (PROCTOCORT) 1 % CREA Use rectally as needed (Patient taking differently: Apply 1 application topically 3 (three) times daily as needed (for rectall pain). ) 1 Tube 3  . Needles & Syringes MISC 1 Syringe by Does not apply route every 30 (thirty) days. 12 each 0  . omeprazole (PRILOSEC) 40 MG capsule Take 1 capsule (40 mg total) by mouth  daily. 30 capsule 12  . ondansetron (ZOFRAN) 4 MG tablet Take 1 tablet (4 mg total) by mouth 2 (two) times daily. 40 tablet 0  . oxyCODONE (OXY IR/ROXICODONE) 5 MG immediate release tablet Take 1 tablet (5 mg total) by mouth every 6 (six) hours as needed for severe pain or breakthrough pain. 20 tablet 0  . Pancrelipase, Lip-Prot-Amyl, (ZENPEP) 40000-126000 units CPEP Take 1 capsule by mouth as directed. Take 2 capsules with meals and 1 capsule with snacks. 210 capsule 3  . predniSONE (DELTASONE) 10 MG tablet Take 3 tablets (30 mg total) by mouth daily with breakfast. (Patient taking differently: Take 25 mg by mouth daily with breakfast. ) 90 tablet 0  . promethazine (PHENERGAN) 25 MG suppository Place 1 suppository (25 mg total) rectally every 6 (six) hours as needed for nausea or vomiting. 12 each 2  . promethazine (PHENERGAN) 25 MG tablet TAKE 1 TABLET BY MOUTH EVERY 6 HOURS AS NEEDED FOR NAUSEA AND VOMITING 30 tablet 0  . tiZANidine (ZANAFLEX) 4 MG tablet Take 4 mg by mouth 3 (three) times daily.    Marland Kitchen topiramate (TOPAMAX) 50 MG tablet Take 1 tablet (50 mg total) by mouth at bedtime. 30 tablet 3  . Vedolizumab (ENTYVIO IV) Inject into the vein every 8 (eight) weeks.    Marland Kitchen zolmitriptan (ZOMIG) 5 MG nasal solution 1 spray in one nostril.  May repeat 1 spray once after 2 hours if needed. Not to exceed 2 sprays in 24 hours (Patient taking differently: Place 1 spray into the nose as needed for migraine. 1 spray in one nostril.  May repeat 1 spray once after 2 hours if needed. Not to exceed 2 sprays in 24 hours) 6 Units 0  . potassium chloride SA (K-DUR,KLOR-CON) 20 MEQ tablet Take 2 tablets (40 mEq total) by mouth daily. 14 tablet 0   No current facility-administered medications for this visit.     Allergies as of 07/19/2016 - Review Complete 07/19/2016  Allergen Reaction Noted  . Codeine Hives and Nausea And Vomiting 08/31/2007  . Humira [adalimumab] Rash 12/24/2013    Family History  Problem  Relation Age of Onset  . Colon cancer Mother   . Colon polyps Mother   . Colon polyps Father   . Lung cancer Father   . Breast cancer Paternal Grandmother   . Colon polyps Brother   . Thyroid  disease Neg Hx   . Stomach cancer Neg Hx   . Pancreatic cancer Neg Hx     Social History   Social History  . Marital status: Married    Spouse name: N/A  . Number of children: 2  . Years of education: N/A   Occupational History  . unemployed [employer]   Social History Main Topics  . Smoking status: Former Smoker    Packs/day: 1.00    Years: 10.00    Types: Cigarettes    Quit date: 03/07/1984  . Smokeless tobacco: Never Used  . Alcohol use No  . Drug use: No  . Sexual activity: Not on file   Other Topics Concern  . Not on file   Social History Narrative  . No narrative on file    Review of Systems: All systems reviewed and negative except where noted in HPI.  Physical Exam: Vital signs in last 24 hours: @VSRANGES @   General:   Alert, well-developed, white female in NAD Psych:  Pleasant, cooperative. Normal mood and affect. Eyes:  Pupils equal, sclera clear, no icterus.   Conjunctiva pink. Ears:  Normal auditory acuity. Nose:  No deformity, discharge,  or lesions. Neck:  Supple; no masses Lungs:  Clear throughout to auscultation.   No wheezes, crackles, or rhonchi.  Heart:  Regular rate and rhythm; no murmurs, no edema Abdomen:  Soft, non-distended, nontender, BS active, no palp mass    Rectal:  Deferred  Msk:  Symmetrical without gross deformities. . Pulses:  Normal pulses noted. Neurologic:  Alert and  oriented x4;  grossly normal neurologically. Skin:  Intact without significant lesions or rashes..   Intake/Output from previous day: No intake/output data recorded. Intake/Output this shift: @IOTHISSHIFT @  Lab Results: No results for input(s): WBC, HGB, HCT, PLT in the last 72 hours. BMET No results for input(s): NA, K, CL, CO2, GLUCOSE, BUN, CREATININE,  CALCIUM in the last 72 hours. LFT No results for input(s): PROT, ALBUMIN, AST, ALT, ALKPHOS, BILITOT, BILIDIR, IBILI in the last 72 hours. PT/INR No results for input(s): LABPROT, INR in the last 72 hours. Hepatitis Panel No results for input(s): HEPBSAG, HCVAB, HEPAIGM, HEPBIGM in the last 72 hours.   Studies/Results: No results found.   Tye Savoy, NP-C @  07/19/2016, 8:50 AM  Pager number [phone]

## 2016-07-19 NOTE — Progress Notes (Signed)
Attending physician's note   I have taken a history, examined the patient and reviewed the chart. I agree with the Advanced Practitioner's note, impression and recommendations. 56 year old female with Crohn's disease on Entyvio and steroid dependence. Unable to wean off steroids.Plan to admit patient for placement of NG tube and observation in order to perform CT enterography . Follow-up CT enterography,  K Denzil Magnuson, MD [phone] Mon-Fri 8a-5p [phone] after 5p, weekends, holidays

## 2016-07-22 DIAGNOSIS — G894 Chronic pain syndrome: Secondary | ICD-10-CM | POA: Diagnosis not present

## 2016-07-22 DIAGNOSIS — M47817 Spondylosis without myelopathy or radiculopathy, lumbosacral region: Secondary | ICD-10-CM | POA: Diagnosis not present

## 2016-07-22 DIAGNOSIS — G629 Polyneuropathy, unspecified: Secondary | ICD-10-CM | POA: Diagnosis not present

## 2016-07-22 DIAGNOSIS — M5137 Other intervertebral disc degeneration, lumbosacral region: Secondary | ICD-10-CM | POA: Diagnosis not present

## 2016-08-08 DIAGNOSIS — Z1231 Encounter for screening mammogram for malignant neoplasm of breast: Secondary | ICD-10-CM | POA: Diagnosis not present

## 2016-08-10 ENCOUNTER — Other Ambulatory Visit: Payer: Self-pay | Admitting: Neurology

## 2016-08-18 ENCOUNTER — Ambulatory Visit: Payer: BLUE CROSS/BLUE SHIELD | Admitting: Gastroenterology

## 2016-08-18 DIAGNOSIS — M7060 Trochanteric bursitis, unspecified hip: Secondary | ICD-10-CM | POA: Diagnosis not present

## 2016-08-19 DIAGNOSIS — Z79899 Other long term (current) drug therapy: Secondary | ICD-10-CM | POA: Diagnosis not present

## 2016-08-19 DIAGNOSIS — G629 Polyneuropathy, unspecified: Secondary | ICD-10-CM | POA: Diagnosis not present

## 2016-08-19 DIAGNOSIS — Z79891 Long term (current) use of opiate analgesic: Secondary | ICD-10-CM | POA: Diagnosis not present

## 2016-08-19 DIAGNOSIS — G894 Chronic pain syndrome: Secondary | ICD-10-CM | POA: Diagnosis not present

## 2016-08-19 DIAGNOSIS — M47817 Spondylosis without myelopathy or radiculopathy, lumbosacral region: Secondary | ICD-10-CM | POA: Diagnosis not present

## 2016-08-19 DIAGNOSIS — M5137 Other intervertebral disc degeneration, lumbosacral region: Secondary | ICD-10-CM | POA: Diagnosis not present

## 2016-08-22 DIAGNOSIS — R1111 Vomiting without nausea: Secondary | ICD-10-CM | POA: Diagnosis not present

## 2016-08-22 DIAGNOSIS — R110 Nausea: Secondary | ICD-10-CM | POA: Diagnosis not present

## 2016-08-22 DIAGNOSIS — R10811 Right upper quadrant abdominal tenderness: Secondary | ICD-10-CM | POA: Diagnosis not present

## 2016-08-22 DIAGNOSIS — I700 Atherosclerosis of aorta: Secondary | ICD-10-CM | POA: Diagnosis not present

## 2016-08-22 DIAGNOSIS — K50818 Crohn's disease of both small and large intestine with other complication: Secondary | ICD-10-CM | POA: Diagnosis not present

## 2016-08-22 DIAGNOSIS — R600 Localized edema: Secondary | ICD-10-CM | POA: Diagnosis not present

## 2016-08-22 DIAGNOSIS — G43009 Migraine without aura, not intractable, without status migrainosus: Secondary | ICD-10-CM | POA: Diagnosis not present

## 2016-08-22 DIAGNOSIS — I10 Essential (primary) hypertension: Secondary | ICD-10-CM | POA: Diagnosis not present

## 2016-08-22 DIAGNOSIS — F5102 Adjustment insomnia: Secondary | ICD-10-CM | POA: Diagnosis not present

## 2016-08-22 DIAGNOSIS — R10817 Generalized abdominal tenderness: Secondary | ICD-10-CM | POA: Diagnosis not present

## 2016-08-29 ENCOUNTER — Other Ambulatory Visit: Payer: Self-pay | Admitting: Gastroenterology

## 2016-08-29 ENCOUNTER — Ambulatory Visit (INDEPENDENT_AMBULATORY_CARE_PROVIDER_SITE_OTHER): Payer: BLUE CROSS/BLUE SHIELD | Admitting: Gastroenterology

## 2016-08-29 ENCOUNTER — Other Ambulatory Visit (INDEPENDENT_AMBULATORY_CARE_PROVIDER_SITE_OTHER): Payer: BLUE CROSS/BLUE SHIELD

## 2016-08-29 ENCOUNTER — Encounter: Payer: Self-pay | Admitting: Gastroenterology

## 2016-08-29 VITALS — BP 128/72 | HR 68 | Ht 67.5 in | Wt 194.4 lb

## 2016-08-29 DIAGNOSIS — K3184 Gastroparesis: Secondary | ICD-10-CM

## 2016-08-29 DIAGNOSIS — R1013 Epigastric pain: Secondary | ICD-10-CM | POA: Diagnosis not present

## 2016-08-29 DIAGNOSIS — R112 Nausea with vomiting, unspecified: Secondary | ICD-10-CM | POA: Diagnosis not present

## 2016-08-29 DIAGNOSIS — K50018 Crohn's disease of small intestine with other complication: Secondary | ICD-10-CM | POA: Diagnosis not present

## 2016-08-29 DIAGNOSIS — K8689 Other specified diseases of pancreas: Secondary | ICD-10-CM

## 2016-08-29 DIAGNOSIS — R197 Diarrhea, unspecified: Secondary | ICD-10-CM

## 2016-08-29 DIAGNOSIS — K920 Hematemesis: Secondary | ICD-10-CM | POA: Diagnosis not present

## 2016-08-29 DIAGNOSIS — K50818 Crohn's disease of both small and large intestine with other complication: Secondary | ICD-10-CM

## 2016-08-29 LAB — CBC WITH DIFFERENTIAL/PLATELET
BASOS ABS: 0.1 10*3/uL (ref 0.0–0.1)
Basophils Relative: 0.8 % (ref 0.0–3.0)
EOS ABS: 0.1 10*3/uL (ref 0.0–0.7)
Eosinophils Relative: 0.9 % (ref 0.0–5.0)
HEMATOCRIT: 37.2 % (ref 36.0–46.0)
HEMOGLOBIN: 12.4 g/dL (ref 12.0–15.0)
Lymphocytes Relative: 26.8 % (ref 12.0–46.0)
Lymphs Abs: 3.2 10*3/uL (ref 0.7–4.0)
MCHC: 33.2 g/dL (ref 30.0–36.0)
MCV: 91.3 fl (ref 78.0–100.0)
Monocytes Absolute: 0.9 10*3/uL (ref 0.1–1.0)
Monocytes Relative: 7.7 % (ref 3.0–12.0)
Neutro Abs: 7.5 10*3/uL (ref 1.4–7.7)
Neutrophils Relative %: 63.8 % (ref 43.0–77.0)
Platelets: 386 10*3/uL (ref 150.0–400.0)
RBC: 4.07 Mil/uL (ref 3.87–5.11)
RDW: 13.7 % (ref 11.5–15.5)
WBC: 11.8 10*3/uL — AB (ref 4.0–10.5)

## 2016-08-29 LAB — COMPREHENSIVE METABOLIC PANEL
ALBUMIN: 4.1 g/dL (ref 3.5–5.2)
ALT: 16 U/L (ref 0–35)
AST: 13 U/L (ref 0–37)
Alkaline Phosphatase: 107 U/L (ref 39–117)
BILIRUBIN TOTAL: 0.4 mg/dL (ref 0.2–1.2)
BUN: 15 mg/dL (ref 6–23)
CALCIUM: 9.5 mg/dL (ref 8.4–10.5)
CO2: 28 mEq/L (ref 19–32)
CREATININE: 0.97 mg/dL (ref 0.40–1.20)
Chloride: 102 mEq/L (ref 96–112)
GFR: 62.88 mL/min (ref >60.00)
Glucose, Bld: 103 mg/dL — ABNORMAL HIGH (ref 70–99)
Potassium: 4 mEq/L (ref 3.5–5.1)
Sodium: 138 mEq/L (ref 135–145)
Total Protein: 7.4 g/dL (ref 6.0–8.3)

## 2016-08-29 LAB — C-REACTIVE PROTEIN: CRP: 1.1 mg/dL (ref 0.5–20.0)

## 2016-08-29 LAB — SEDIMENTATION RATE: Sed Rate: 69 mm/hr — ABNORMAL HIGH (ref 0–30)

## 2016-08-29 MED ORDER — CHOLESTYRAMINE LIGHT 4 G PO PACK: 4.0000 g | PACK | Freq: Two times a day (BID) | ORAL | 3 refills | Status: DC

## 2016-08-29 MED ORDER — PANCRELIPASE (LIP-PROT-AMYL) 40000-126000 UNITS PO CPEP: 1.0000 | ORAL_CAPSULE | ORAL | 3 refills | Status: DC

## 2016-08-29 NOTE — Progress Notes (Signed)
[name]    [id number]    04-22-1960  Primary Care Physician:Cox, Elnita Maxwell, MD (Inactive)  Referring Physician: Rochel Brome, MD [address], [place] [address]  Chief complaint:  Crohn's disease, vomiting, diarrhea  HPI: 56 year old female with history of Crohn's disease with small bowel and colonic involvement status post terminal ileal resection in 2008 and 2012 share for follow-up visit. Last seen in office on 07/19/2016 by Tye Savoy Patient is on Entyvio every 8 weeks maintenance. She was previously on Remicade and Humira She has history of chronic steroid dependence, and was having difficulty tapering off steroids to recent hospitalization with small bowel obstruction. She is currently on prednisone 5 mg daily CT enterography was negative for any significant small bowel disease or inflammation. She continues to have intermittent vomiting 2-3 times a week. Also reports 4-5 semi-formed bowel movements daily.  She is on chronic narcotics for chronic pain.  Pancreatic insufficiency, she was switched to Zenpep as her insurance stopped covering Creon.  EGD February 2018 showed retained food in gastric fundus and body with incomplete visualization of mucosa concerning for gastroparesis   Last colonoscopy March 2016 with removal of tubular adenoma. Focal active ileitis and colitis on random biopsies with no dysplasia      Outpatient Encounter Prescriptions as of 08/29/2016  Medication Sig  . albuterol (PROAIR HFA) 108 (90 BASE) MCG/ACT inhaler Inhale 2 puffs into the lungs every 4 (four) hours as needed for wheezing or shortness of breath.   . budesonide-formoterol (SYMBICORT) 80-4.5 MCG/ACT inhaler Inhale 2 puffs into the lungs daily as needed (SOB, wheezing).   . Cyanocobalamin (VITAMIN B-12 IJ) Inject 1,000 mcg into the muscle every 30 (thirty) days.  Marland Kitchen gabapentin (NEURONTIN) 400 MG capsule Take 1 capsule by mouth at lunch, 2 at dinner  and 3 at bedtime  . hydrocortisone (PROCTOCORT) 1 % CREA Use rectally as needed  . Needles & Syringes MISC 1 Syringe by Does not apply route every 30 (thirty) days.  Marland Kitchen omeprazole (PRILOSEC) 40 MG capsule Take 1 capsule (40 mg total) by mouth daily.  . ondansetron (ZOFRAN) 4 MG tablet Take 1 tablet (4 mg total) by mouth 2 (two) times daily.  . Oxycodone HCl 10 MG TABS Take 10 mg by mouth See admin instructions. 1 tablet up to five times a day as needed for severe pain.  . Pancrelipase, Lip-Prot-Amyl, (ZENPEP) 40000-126000 units CPEP Take 1 capsule by mouth as directed. Take 2 capsules with meals and 1 capsule with snacks. (Patient taking differently: Take 1-2 capsules by mouth See admin instructions. Take 2 capsules three times a day with meals and 1 capsule with each snack.)  . predniSONE (DELTASONE) 10 MG tablet Take 2.5 tablets (25 mg total) by mouth daily with breakfast.  . promethazine (PHENERGAN) 25 MG suppository Place 1 suppository (25 mg total) rectally every 6 (six) hours as needed for nausea or vomiting.  . promethazine (PHENERGAN) 25 MG tablet TAKE 1 TABLET BY MOUTH EVERY 6 HOURS AS NEEDED FOR NAUSEA AND VOMITING  . propranolol ER (INDERAL LA) 80 MG 24 hr capsule Take 1 capsule by mouth daily.  Marland Kitchen tiZANidine (ZANAFLEX) 4 MG tablet Take 4 mg by mouth 3 (three) times daily.  Marland Kitchen topiramate (TOPAMAX) 50 MG tablet TAKE ONE TABLET BY MOUTH AT BEDTIME  . Vedolizumab (ENTYVIO IV) Inject into the vein every 8 (eight) weeks. Receives at [facility].  . zolmitriptan (ZOMIG) 5  MG nasal solution 1 spray in one nostril.  May repeat 1 spray once after 2 hours if needed. Not to exceed 2 sprays in 24 hours (Patient taking differently: Place 1 spray into the nose every 2 (two) hours as needed for migraine. 1 spray in one nostril.  May repeat 1 spray once after 2 hours if needed. Not to exceed 2 sprays in 24 hours)  . [DISCONTINUED] amLODipine (NORVASC) 10 MG tablet Take 10 mg by mouth daily.  .  [DISCONTINUED] gabapentin (NEURONTIN) 600 MG tablet Take 600-1,800 mg by mouth 3 (three) times daily. Takes 600 mg at lunch, 1200 mg at dinner, and 1800 mg at bedtime  . [DISCONTINUED] hydrocortisone (PROCTOCORT) 1 % CREA Use rectally as needed (Patient taking differently: Apply 1 application topically 3 (three) times daily as needed (for rectal pain). )   No facility-administered encounter medications on file as of 08/29/2016.     Allergies as of 08/29/2016 - Review Complete 08/29/2016  Allergen Reaction Noted  . Codeine Hives and Nausea And Vomiting 08/31/2007  . Humira [adalimumab] Rash 12/24/2013    Past Medical History:  Diagnosis Date  . Arthritis   . Chronic diarrhea   . Chronic disease anemia   . Crohn's disease ([place])   . Depression   . Family history of malignant neoplasm of gastrointestinal tract   . GERD (gastroesophageal reflux disease)   . H/O hiatal hernia   . History of adenomatous polyp of colon   . History of gastritis    AND HX ILEITIS  . History of kidney stones   . History of small bowel obstruction    SECONDARY CROHN'S STRICTURE  S/P COLECTOMY  . Hyperlipidemia   . Hypertension   . Hypopotassemia   . Internal hemorrhoids   . Kidney stones   . Pancreatic insufficiency   . PONV (postoperative nausea and vomiting)   . Psoriasis    SKIN  . Right ureteral stone   . Rotavirus infection 05/31/2013  . S/P dilatation of esophageal stricture   . Seasonal asthma   . Urgency of urination   . Vitamin B12 deficiency     Past Surgical History:  Procedure Laterality Date  . ANAL FISSURE REPAIR  1990's  . CARPAL TUNNEL RELEASE Right 2000  . CHOLECYSTECTOMY  2002  . CYSTOSCOPY WITH RETROGRADE PYELOGRAM, URETEROSCOPY AND STENT PLACEMENT Left 03/08/2013   Procedure: CYSTOSCOPY WITH RETROGRADE PYELOGRAM, URETEROSCOPY AND STENT PLACEMENT stone basketing;  Surgeon: Alexis Frock, MD;  Location: [facility];  Service: Urology;  Laterality: Left;  . CYSTOSCOPY WITH RETROGRADE  PYELOGRAM, URETEROSCOPY AND STENT PLACEMENT Right 04/03/2013   Procedure: CYSTOSCOPY WITH RETROGRADE PYELOGRAM, URETEROSCOPY AND STENT PLACEMENT;  Surgeon: Alexis Frock, MD;  Location: [facility];  Service: Urology;  Laterality: Right;  . DX LAPAROSCOPY CONVERTED TO OPEN RIGHT COLECCTOMY  09-15-2010   ANASTOMOTIC STRICTURE  . LAPAROSCOPIC ILEOCECECTOMY  10-09-2008   AND APPENDECTOMY (TERMINAL ILEITIS & PARTIAL SBO)  . VAGINAL HYSTERECTOMY  1992    Family History  Problem Relation Age of Onset  . Colon cancer Mother   . Colon polyps Mother   . Colon polyps Father   . Lung cancer Father   . Breast cancer Paternal Grandmother   . Colon polyps Brother   . Thyroid disease Neg Hx   . Stomach cancer Neg Hx   . Pancreatic cancer Neg Hx     Social History   Social History  . Marital status: Married    Spouse name: N/A  .  Number of children: 2  . Years of education: N/A   Occupational History  . unemployed [employer]   Social History Main Topics  . Smoking status: Former Smoker    Packs/day: 1.00    Years: 10.00    Types: Cigarettes    Quit date: 03/07/1984  . Smokeless tobacco: Never Used  . Alcohol use No  . Drug use: No  . Sexual activity: Not on file   Other Topics Concern  . Not on file   Social History Narrative  . No narrative on file      Review of systems: Review of Systems  Constitutional: Negative for fever and chills. Positive for night sweats HENT: Negative.   Eyes: Negative for blurred vision.  Respiratory: Negative for cough, shortness of breath and wheezing.   Cardiovascular: Negative for chest pain and palpitations.  Gastrointestinal: as per HPI Genitourinary: Negative for dysuria, urgency, frequency and hematuria.  Musculoskeletal: Positive for myalgias, back pain and joint pain.  Skin: Negative for itching and positive psoriasis rash.  Neurological: Negative for dizziness, tremors, focal weakness, seizures and loss of  consciousness.  Endo/Heme/Allergies: Positive for seasonal allergies.  Psychiatric/Behavioral: Negative for depression, suicidal ideas and hallucinations.  All other systems reviewed and are negative.   Physical Exam: Vitals:   08/29/16 1336  BP: 128/72  Pulse: 68   Body mass index is 29.99 kg/m. Gen:      No acute distress HEENT:  EOMI, sclera anicteric Neck:     No masses; no thyromegaly Lungs:    Clear to auscultation bilaterally; normal respiratory effort CV:         Regular rate and rhythm; no murmurs Abd:      + bowel sounds; soft, non-tender; no palpable masses, no distension Ext:    No edema; adequate peripheral perfusion Skin:      Warm and dry; psoriasis rash on both elbows and knee Neuro: alert and oriented x 3 Psych: normal mood and affect  Data Reviewed:  Reviewed labs, radiology imaging, old records and pertinent past GI work up   Assessment and Plan/Recommendations:  56 year old female with history of Crohn's disease status post terminal ileal resection in 2012 with evidence of active focal ileitis and colitis on random biopsies in 2016, pancreatic insufficiency, chronic pain on narcotics here with complaints of persistent intermittent nausea and vomiting  Patient had retained food in stomach on EGD, concerning for gastroparesis could be secondary to chronic narcotics with delayed motility Gastric emptying scan Schedule for EGD, advised patient to fast with no solid food for 12 hours prior to the procedure and nothing by mouth 4 hours prior to the procedure The risks and benefits as well as alternatives of endoscopic procedure(s) have been discussed and reviewed. All questions answered. The patient agrees to proceed.   Persistent diarrhea: Could be secondary to Crohn's versus pancreatic insufficiency versus bile salt diarrhea status post cholecystectomy We will start cholestyramine 4 g twice daily Continue Zenpep 40,000 units 2 capsules before meals and one to  2 capsules before snacks Advised patient to increase the dose of Zenpep in 2 weeks if she continues to have persistent diarrhea despite cholestyramine We will need to consider colonoscopy with biopsies to assess disease activity but given persistent nausea and vomiting patient may not tolerate the prep, we'll rediscuss at next visit   Greater than 50% of the time used for counseling as well as treatment plan and follow-up. She had multiple questions which were answered to her satisfaction  Damaris Hippo , MD [phone] Mon-Fri 8a-5p [phone] after 5p, weekends, holidays  CC: Cox, Elnita Maxwell, MD

## 2016-08-29 NOTE — Patient Instructions (Addendum)
You have been scheduled for a gastric emptying scan at [facility] on 7/6/201/ at 7am  . Please arrive at least 15 minutes prior to your appointment for registration. Please make certain not to have anything to eat or drink after midnight the night before your test. Hold all stomach medications (ex: Zofran, phenergan, Reglan) 48 hours prior to your test. If you need to reschedule your appointment, please contact radiology scheduling at [phone]. _____________________________________________________________________ A gastric-emptying study measures how long it takes for food to move through your stomach. There are several ways to measure stomach emptying. In the most common test, you eat food that contains a small amount of radioactive material. A scanner that detects the movement of the radioactive material is placed over your abdomen to monitor the rate at which food leaves your stomach. This test normally takes about 4 hours to complete. _____________________________________________________________________   Dennis Bast have been scheduled for an endoscopy. Please follow written instructions given to you at your visit today. If you use inhalers (even only as needed), please bring them with you on the day of your procedure. Your physician has requested that you go to www.startemmi.com and enter the access code given to you at your visit today. This web site gives a general overview about your procedure. However, you should still follow specific instructions given to you by our office regarding your preparation for the procedure.\  Go to the basement for labs today  We will send Cholestyramine to your pharmacy  Increase Zenpep to 3 with meals  And 1-2 with snacks

## 2016-08-30 ENCOUNTER — Encounter (HOSPITAL_COMMUNITY)
Admission: RE | Admit: 2016-08-30 | Discharge: 2016-08-30 | Disposition: A | Discharge status: Home / Self Care | Payer: BLUE CROSS/BLUE SHIELD | Source: Ambulatory Visit | Attending: Gastroenterology | Admitting: Gastroenterology

## 2016-08-30 ENCOUNTER — Encounter (HOSPITAL_COMMUNITY): Payer: Self-pay

## 2016-08-30 DIAGNOSIS — K5010 Crohn's disease of large intestine without complications: Secondary | ICD-10-CM

## 2016-08-30 DIAGNOSIS — K50119 Crohn's disease of large intestine with unspecified complications: Secondary | ICD-10-CM | POA: Diagnosis not present

## 2016-08-30 MED ORDER — VEDOLIZUMAB 300 MG IV SOLR
300.0000 mg | INTRAVENOUS | Status: DC
  Administered 2016-08-30: 300 mg via INTRAVENOUS
  Filled 2016-08-30: qty 5

## 2016-08-30 MED ORDER — SODIUM CHLORIDE 0.9 % IV SOLN
INTRAVENOUS | Status: AC
  Administered 2016-08-30: 11:00:00 via INTRAVENOUS

## 2016-08-30 NOTE — Discharge Instructions (Signed)
Vedolizumab injection solution What is this medicine? VEDOLIZUMAB (Ve doe LIZ you mab) is used to treat ulcerative colitis and Crohn's disease in adult patients. This medicine may be used for other purposes; ask your health care provider or pharmacist if you have questions. COMMON BRAND NAME(S): Entyvio What should I tell my health care provider before I take this medicine? They need to know if you have any of these conditions: -diabetes -hepatitis B or history of hepatitis B infection -HIV or AIDS -immune system problems -infection or history of infections -liver disease -recently received or scheduled to receive a vaccine -scheduled to have surgery -tuberculosis, a positive skin test for tuberculosis or have recently been in close contact with someone who has tuberculosis - an unusual or allergic reaction to vedolizumab, other medicines, foods, dyes, or preservatives -pregnant or trying to get pregnant -breast-feeding How should I use this medicine? This medicine is for infusion into a vein. It is given by a health care professional in a hospital or clinic setting. A special MedGuide will be given to you by the pharmacist with each prescription and refill. Be sure to read this information carefully each time. Talk to your pediatrician regarding the use of this medicine in children. This medicine is not approved for use in children. Overdosage: If you think you have taken too much of this medicine contact a poison control center or emergency room at once. NOTE: This medicine is only for you. Do not share this medicine with others. What if I miss a dose? It is important not to miss your dose. Call your doctor or health care professional if you are unable to keep an appointment. What may interact with this medicine? -steroid medicines like prednisone or cortisone -TNF-alpha inhibitors like natalizumab, adalimumab, and infliximab -vaccines This list may not describe all possible  interactions. Give your health care provider a list of all the medicines, herbs, non-prescription drugs, or dietary supplements you use. Also tell them if you smoke, drink alcohol, or use illegal drugs. Some items may interact with your medicine. What should I watch for while using this medicine? Your condition will be monitored carefully while you are receiving this medicine. Visit your doctor for regular check ups. Tell your doctor or healthcare professional if your symptoms do not start to get better or if they get worse. Stay away from people who are sick. Call your doctor or health care professional for advice if you get a fever, chills or sore throat, or other symptoms of a cold or flu. Do not treat yourself. In some patients, this medicine may cause a serious brain infection that may cause death. If you have any problems seeing, thinking, speaking, walking, or standing, tell your doctor right away. If you cannot reach your doctor, get urgent medical care. What side effects may I notice from receiving this medicine? Side effects that you should report to your doctor or health care professional as soon as possible: -allergic reactions like skin rash, itching or hives, swelling of the face, lips, or tongue -breathing problems -changes in vision -chest pain -dark urine -depression, feelings of sadness -dizziness -general ill feeling or flu-like symptoms -irregular, missed, or painful menstrual periods -light-colored stools -loss of appetite, nausea -muscle weakness -problems with balance, talking, or walking -right upper belly pain -unusually weak or tired -yellowing of the eyes or skin Side effects that usually do not require medical attention (report to your doctor or health care professional if they continue or are bothersome): -aches, pains -headache -  stomach upset -tiredness This list may not describe all possible side effects. Call your doctor for medical advice about side  effects. You may report side effects to FDA at 1-800-FDA-1088. Where should I keep my medicine? This drug is given in a hospital or clinic and will not be stored at home. NOTE: This sheet is a summary. It may not cover all possible information. If you have questions about this medicine, talk to your doctor, pharmacist, or health care provider.  2018 Elsevier/Gold Standard (2015-03-26 08:36:51)

## 2016-09-01 DIAGNOSIS — M7060 Trochanteric bursitis, unspecified hip: Secondary | ICD-10-CM | POA: Diagnosis not present

## 2016-09-01 MED ORDER — PROMETHAZINE HCL 25 MG PO TABS: 25.0000 mg | ORAL_TABLET | Freq: Four times a day (QID) | ORAL | 0 refills | Status: DC | PRN

## 2016-09-01 NOTE — Telephone Encounter (Signed)
Patient calls back to discuss her lab results. She reports she is out of her anti-emetic and has been vomiting off and on since 1:00 am. Last vomited less than an hour ago. Abdominal pain is the same as it was at her visit 08/29/16. She is asking for a refill on her Phenergan.

## 2016-09-02 DIAGNOSIS — L400 Psoriasis vulgaris: Secondary | ICD-10-CM | POA: Diagnosis not present

## 2016-09-05 ENCOUNTER — Other Ambulatory Visit: Payer: Self-pay

## 2016-09-05 ENCOUNTER — Telehealth: Payer: Self-pay

## 2016-09-05 MED ORDER — PROMETHAZINE HCL 25 MG PO TABS: 25.0000 mg | ORAL_TABLET | Freq: Four times a day (QID) | ORAL | 2 refills | Status: DC | PRN

## 2016-09-05 NOTE — Telephone Encounter (Signed)
Ok to send refill for phenergan

## 2016-09-05 NOTE — Telephone Encounter (Addendum)
-----   Message -----  From: Christophe Louis  Sent: 08/29/2016  7:29 PM  To: [facility] Clinical Pool  Subject: Medication Renewal Request              [name]. [place] would like a refill of the following medications:     promethazine (PHENERGAN) 25 MG tablet Harl Bowie, MD]   Patient Comment: I asked while in for office visit and was told was getting a refill but pharmacy said that I didn't get one.    Preferred pharmacy: [facility] [place], [place]   Please advise on refills for phenergan. Thank you.

## 2016-09-05 NOTE — Telephone Encounter (Signed)
Refilled as directed by Dr Silverio Decamp. Pt has been made aware.

## 2016-09-09 ENCOUNTER — Other Ambulatory Visit (HOSPITAL_COMMUNITY): Payer: Self-pay | Admitting: Urology

## 2016-09-09 ENCOUNTER — Ambulatory Visit (HOSPITAL_COMMUNITY)
Admission: RE | Admit: 2016-09-09 | Discharge: 2016-09-09 | Disposition: A | Discharge status: Home / Self Care | Payer: BLUE CROSS/BLUE SHIELD | Source: Ambulatory Visit | Attending: Gastroenterology | Admitting: Gastroenterology

## 2016-09-09 DIAGNOSIS — R1013 Epigastric pain: Secondary | ICD-10-CM

## 2016-09-09 DIAGNOSIS — R112 Nausea with vomiting, unspecified: Secondary | ICD-10-CM | POA: Diagnosis not present

## 2016-09-09 DIAGNOSIS — K50818 Crohn's disease of both small and large intestine with other complication: Secondary | ICD-10-CM

## 2016-09-09 DIAGNOSIS — K8689 Other specified diseases of pancreas: Secondary | ICD-10-CM | POA: Diagnosis not present

## 2016-09-09 DIAGNOSIS — R197 Diarrhea, unspecified: Secondary | ICD-10-CM | POA: Diagnosis not present

## 2016-09-09 DIAGNOSIS — N1370 Vesicoureteral-reflux, unspecified: Secondary | ICD-10-CM

## 2016-09-09 DIAGNOSIS — K5090 Crohn's disease, unspecified, without complications: Secondary | ICD-10-CM | POA: Diagnosis not present

## 2016-09-09 MED ORDER — TECHNETIUM TC 99M SULFUR COLLOID
2.0000 | Freq: Once | INTRAVENOUS | Status: AC | PRN
  Administered 2016-09-09: 2 via ORAL

## 2016-09-22 DIAGNOSIS — K50018 Crohn's disease of small intestine with other complication: Secondary | ICD-10-CM | POA: Diagnosis not present

## 2016-09-22 DIAGNOSIS — R194 Change in bowel habit: Secondary | ICD-10-CM | POA: Diagnosis not present

## 2016-09-22 DIAGNOSIS — E860 Dehydration: Secondary | ICD-10-CM | POA: Diagnosis not present

## 2016-10-14 ENCOUNTER — Encounter: Payer: BLUE CROSS/BLUE SHIELD | Admitting: Gastroenterology

## 2016-10-14 DIAGNOSIS — G629 Polyneuropathy, unspecified: Secondary | ICD-10-CM | POA: Diagnosis not present

## 2016-10-14 DIAGNOSIS — Z79899 Other long term (current) drug therapy: Secondary | ICD-10-CM | POA: Diagnosis not present

## 2016-10-14 DIAGNOSIS — G894 Chronic pain syndrome: Secondary | ICD-10-CM | POA: Diagnosis not present

## 2016-10-14 DIAGNOSIS — M5137 Other intervertebral disc degeneration, lumbosacral region: Secondary | ICD-10-CM | POA: Diagnosis not present

## 2016-10-14 DIAGNOSIS — Z79891 Long term (current) use of opiate analgesic: Secondary | ICD-10-CM | POA: Diagnosis not present

## 2016-10-14 DIAGNOSIS — M47817 Spondylosis without myelopathy or radiculopathy, lumbosacral region: Secondary | ICD-10-CM | POA: Diagnosis not present

## 2016-10-18 ENCOUNTER — Ambulatory Visit (INDEPENDENT_AMBULATORY_CARE_PROVIDER_SITE_OTHER): Payer: BLUE CROSS/BLUE SHIELD | Admitting: Gastroenterology

## 2016-10-18 ENCOUNTER — Other Ambulatory Visit (INDEPENDENT_AMBULATORY_CARE_PROVIDER_SITE_OTHER): Payer: BLUE CROSS/BLUE SHIELD

## 2016-10-18 ENCOUNTER — Encounter: Payer: Self-pay | Admitting: Gastroenterology

## 2016-10-18 ENCOUNTER — Other Ambulatory Visit: Payer: Self-pay

## 2016-10-18 VITALS — BP 136/94 | HR 76 | Ht 67.0 in | Wt 189.5 lb

## 2016-10-18 DIAGNOSIS — F1190 Opioid use, unspecified, uncomplicated: Secondary | ICD-10-CM | POA: Diagnosis not present

## 2016-10-18 DIAGNOSIS — R112 Nausea with vomiting, unspecified: Secondary | ICD-10-CM

## 2016-10-18 DIAGNOSIS — K8689 Other specified diseases of pancreas: Secondary | ICD-10-CM | POA: Diagnosis not present

## 2016-10-18 DIAGNOSIS — K50018 Crohn's disease of small intestine with other complication: Secondary | ICD-10-CM

## 2016-10-18 DIAGNOSIS — K50118 Crohn's disease of large intestine with other complication: Secondary | ICD-10-CM

## 2016-10-18 DIAGNOSIS — R197 Diarrhea, unspecified: Secondary | ICD-10-CM

## 2016-10-18 LAB — CBC WITH DIFFERENTIAL/PLATELET
BASOS PCT: 0.6 % (ref 0.0–3.0)
Basophils Absolute: 0.1 10*3/uL (ref 0.0–0.1)
EOS PCT: 0.9 % (ref 0.0–5.0)
Eosinophils Absolute: 0.1 10*3/uL (ref 0.0–0.7)
HCT: 42.9 % (ref 36.0–46.0)
HEMOGLOBIN: 14.2 g/dL (ref 12.0–15.0)
LYMPHS ABS: 2.8 10*3/uL (ref 0.7–4.0)
Lymphocytes Relative: 27.9 % (ref 12.0–46.0)
MCHC: 33 g/dL (ref 30.0–36.0)
MCV: 91.4 fl (ref 78.0–100.0)
MONO ABS: 0.8 10*3/uL (ref 0.1–1.0)
Monocytes Relative: 7.6 % (ref 3.0–12.0)
Neutro Abs: 6.3 10*3/uL (ref 1.4–7.7)
Neutrophils Relative %: 63 % (ref 43.0–77.0)
Platelets: 300 10*3/uL (ref 150.0–400.0)
RBC: 4.69 Mil/uL (ref 3.87–5.11)
RDW: 13.8 % (ref 11.5–15.5)
WBC: 10 10*3/uL (ref 4.0–10.5)

## 2016-10-18 LAB — C. DIFFICILE GDH AND TOXIN A/B
C. difficile GDH: DETECTED — AB
C. difficile Toxin A/B: NOT DETECTED

## 2016-10-18 LAB — COMPREHENSIVE METABOLIC PANEL
ALT: 12 U/L (ref 0–35)
AST: 12 U/L (ref 0–37)
Albumin: 4.2 g/dL (ref 3.5–5.2)
Alkaline Phosphatase: 103 U/L (ref 39–117)
BILIRUBIN TOTAL: 0.5 mg/dL (ref 0.2–1.2)
BUN: 13 mg/dL (ref 6–23)
CALCIUM: 9.5 mg/dL (ref 8.4–10.5)
CHLORIDE: 106 meq/L (ref 96–112)
CO2: 26 meq/L (ref 19–32)
Creatinine, Ser: 0.84 mg/dL (ref 0.40–1.20)
GFR: 74.2 mL/min (ref >60.00)
Glucose, Bld: 110 mg/dL — ABNORMAL HIGH (ref 70–99)
Potassium: 3.6 mEq/L (ref 3.5–5.1)
Sodium: 142 mEq/L (ref 135–145)
Total Protein: 7.3 g/dL (ref 6.0–8.3)

## 2016-10-18 LAB — FERRITIN: Ferritin: 88.8 ng/mL (ref 10.0–291.0)

## 2016-10-18 LAB — IBC PANEL
IRON: 68 ug/dL (ref 42–145)
Saturation Ratios: 15.8 % — ABNORMAL LOW (ref 20.0–50.0)
TRANSFERRIN: 308 mg/dL (ref 212.0–360.0)

## 2016-10-18 LAB — SEDIMENTATION RATE: Sed Rate: 7 mm/hr (ref 0–30)

## 2016-10-18 LAB — B12 AND FOLATE PANEL
Folate: 13.3 ng/mL (ref >5.9)
VITAMIN B 12: 173 pg/mL — AB (ref 211–911)

## 2016-10-18 MED ORDER — PANCRELIPASE (LIP-PROT-AMYL) 36000-114000 UNITS PO CPEP: 72000.0000 [IU] | ORAL_CAPSULE | Freq: Every day | ORAL | 1 refills | Status: DC

## 2016-10-18 MED ORDER — METOCLOPRAMIDE HCL 5 MG PO TABS: 5.0000 mg | ORAL_TABLET | Freq: Four times a day (QID) | ORAL | 1 refills | Status: DC

## 2016-10-18 NOTE — Progress Notes (Signed)
[name]    [id number]    1960-05-10  Primary Care Physician:Cox, Elnita Maxwell, MD  Referring Physician: No referring provider defined for this encounter.  Chief complaint: Nausea, vomiting, diarrhea  HPI: 56 year old female with history of Crohn's disease and pancreatic insufficiency here for follow-up visit She continues to have intermittent nausea, vomiting and diarrhea with 5-6 bowel movements daily. She continues to vomit at least once or twice every 1-2 days . According to patient she drink sweet tea 4-5 glasses a day .She switched from Zenpep back to Creon as Zenpep was ineffective. Patient said she was taking 3 capsules of Creon 12,000 units with meals and 2 capsules with snacks. Patient was tapered off prednisone after admission with partial SBO in March 2018, she was having difficulty tapering off steroids and had to be done over a prolonged period of time. Gastric emptying study was normal on 09/10/2078. She is on chronic narcotics through preferred pain management clinic  Previous GI  Hx: Crohn's disease with small bowel and colonic involvement status post terminal ileal resection in 2008 and 2012 share for follow-up visit.  Patient is on Entyvio every 8 weeks maintenance. She was previously on Remicade and Humira She has history of chronic steroid dependence, and was having difficulty tapering off steroids to recent hospitalization with small bowel obstruction. CT enterography was negative for any significant small bowel disease or inflammation. She is on chronic narcotics for chronic pain.  Pancreatic insufficiency, she was switched to Zenpep as her insurance stopped covering Creon.  EGD February 2018 showed retained food in gastric fundus and body with incomplete visualization of mucosa concerning for gastroparesis   Outpatient Encounter Prescriptions as of 10/18/2016  Medication Sig  . albuterol (PROAIR HFA) 108 (90 BASE) MCG/ACT inhaler Inhale 2 puffs  into the lungs every 4 (four) hours as needed for wheezing or shortness of breath.   . budesonide-formoterol (SYMBICORT) 80-4.5 MCG/ACT inhaler Inhale 2 puffs into the lungs daily as needed (SOB, wheezing).   . cholestyramine light (PREVALITE) 4 g packet Take 1 packet (4 g total) by mouth 2 (two) times daily. Before breakfast and dinner  . Cyanocobalamin (VITAMIN B-12 IJ) Inject 1,000 mcg into the muscle every 30 (thirty) days.  Marland Kitchen gabapentin (NEURONTIN) 400 MG capsule Take 1 capsule by mouth at lunch, 2 at dinner and 3 at bedtime  . hydrocortisone (PROCTOCORT) 1 % CREA Use rectally as needed  . lipase/protease/amylase (CREON) 12000 units CPEP capsule Take by mouth.  . Needles & Syringes MISC 1 Syringe by Does not apply route every 30 (thirty) days.  Marland Kitchen omeprazole (PRILOSEC) 40 MG capsule Take 1 capsule (40 mg total) by mouth daily.  . Oxycodone HCl 10 MG TABS Take 10 mg by mouth See admin instructions. 1 tablet up to five times a day as needed for severe pain.  . promethazine (PHENERGAN) 25 MG suppository Place 1 suppository (25 mg total) rectally every 6 (six) hours as needed for nausea or vomiting.  . promethazine (PHENERGAN) 25 MG tablet Take 1 tablet (25 mg total) by mouth every 6 (six) hours as needed for nausea or vomiting.  . propranolol ER (INDERAL LA) 80 MG 24 hr capsule Take 1 capsule by mouth daily.  Marland Kitchen tiZANidine (ZANAFLEX) 4 MG tablet Take 4 mg by mouth 3 (three) times daily.  Marland Kitchen topiramate (TOPAMAX) 50 MG tablet TAKE ONE TABLET BY MOUTH AT BEDTIME  . Vedolizumab (ENTYVIO IV) Inject into the vein every  8 (eight) weeks. Receives at [facility].  . zolmitriptan (ZOMIG) 5 MG nasal solution 1 spray in one nostril.  May repeat 1 spray once after 2 hours if needed. Not to exceed 2 sprays in 24 hours (Patient taking differently: Place 1 spray into the nose every 2 (two) hours as needed for migraine. 1 spray in one nostril.  May repeat 1 spray once after 2 hours if needed. Not to exceed 2 sprays  in 24 hours)  . [DISCONTINUED] ondansetron (ZOFRAN) 4 MG tablet Take 1 tablet (4 mg total) by mouth 2 (two) times daily.  . [DISCONTINUED] Pancrelipase, Lip-Prot-Amyl, (ZENPEP) 40000-126000 units CPEP Take 1 capsule by mouth as directed. Take 3 with meals and 1-2 with snacks  . [DISCONTINUED] predniSONE (DELTASONE) 10 MG tablet Take 2.5 tablets (25 mg total) by mouth daily with breakfast.   No facility-administered encounter medications on file as of 10/18/2016.     Allergies as of 10/18/2016 - Review Complete 10/18/2016  Allergen Reaction Noted  . Codeine Hives and Nausea And Vomiting 08/31/2007  . Humira [adalimumab] Rash 12/24/2013    Past Medical History:  Diagnosis Date  . Arthritis   . Chronic diarrhea   . Chronic disease anemia   . Crohn's disease ([place])   . Depression   . Family history of malignant neoplasm of gastrointestinal tract   . GERD (gastroesophageal reflux disease)   . H/O hiatal hernia   . History of adenomatous polyp of colon   . History of gastritis    AND HX ILEITIS  . History of kidney stones   . History of small bowel obstruction    SECONDARY CROHN'S STRICTURE  S/P COLECTOMY  . Hyperlipidemia   . Hypertension   . Hypopotassemia   . Internal hemorrhoids   . Kidney stones   . Pancreatic insufficiency   . PONV (postoperative nausea and vomiting)   . Psoriasis    SKIN  . Right ureteral stone   . Rotavirus infection 05/31/2013  . S/P dilatation of esophageal stricture   . Seasonal asthma   . Urgency of urination   . Vitamin B12 deficiency     Past Surgical History:  Procedure Laterality Date  . ANAL FISSURE REPAIR  1990's  . CARPAL TUNNEL RELEASE Right 2000  . CHOLECYSTECTOMY  2002  . CYSTOSCOPY WITH RETROGRADE PYELOGRAM, URETEROSCOPY AND STENT PLACEMENT Left 03/08/2013   Procedure: CYSTOSCOPY WITH RETROGRADE PYELOGRAM, URETEROSCOPY AND STENT PLACEMENT stone basketing;  Surgeon: Alexis Frock, MD;  Location: [facility];  Service: Urology;  Laterality:  Left;  . CYSTOSCOPY WITH RETROGRADE PYELOGRAM, URETEROSCOPY AND STENT PLACEMENT Right 04/03/2013   Procedure: CYSTOSCOPY WITH RETROGRADE PYELOGRAM, URETEROSCOPY AND STENT PLACEMENT;  Surgeon: Alexis Frock, MD;  [facility]: [facility];  Service: Urology;  Laterality: Right;  . DX LAPAROSCOPY CONVERTED TO OPEN RIGHT COLECCTOMY  09-15-2010   ANASTOMOTIC STRICTURE  . LAPAROSCOPIC ILEOCECECTOMY  10-09-2008   AND APPENDECTOMY (TERMINAL ILEITIS & PARTIAL SBO)  . VAGINAL HYSTERECTOMY  1992    Family History  Problem Relation Age of Onset  . Colon cancer Mother   . Colon polyps Mother   . Colon polyps Father   . Lung cancer Father   . Breast cancer Paternal Grandmother   . Colon polyps Brother   . Thyroid disease Neg Hx   . Stomach cancer Neg Hx   . Pancreatic cancer Neg Hx     Social History   Social History  . Marital status: Married    Spouse name:  N/A  . Number of children: 2  . Years of education: N/A   Occupational History  . unemployed [employer]   Social History Main Topics  . Smoking status: Former Smoker    Packs/day: 1.00    Years: 10.00    Types: Cigarettes    Quit date: 03/07/1984  . Smokeless tobacco: Never Used  . Alcohol use No  . Drug use: No  . Sexual activity: Not on file   Other Topics Concern  . Not on file   Social History Narrative  . No narrative on file      Review of systems: Review of Systems  Constitutional: Negative for fever and chills.  HENT: Negative.   Eyes: Negative for blurred vision.  Respiratory: Negative for cough, shortness of breath and wheezing.   Cardiovascular: Negative for chest pain and palpitations.  Gastrointestinal: as per HPI Genitourinary: Negative for dysuria, urgency, frequency and hematuria.  Musculoskeletal: Negative for myalgias, back pain and joint pain.  Skin: Negative for itching and rash.  Neurological: Negative for dizziness, tremors, focal weakness, seizures and loss of consciousness.    Endo/Heme/Allergies: Positive for seasonal allergies.  Psychiatric/Behavioral: Negative for depression, suicidal ideas and hallucinations.  All other systems reviewed and are negative.   Physical Exam: Vitals:   10/18/16 0904  BP: (!) 136/94  Pulse: 76   Body mass index is 29.68 kg/m. Gen:      No acute distress, obese, moon facies HEENT:  EOMI, sclera anicteric Neck:     No masses; no thyromegaly Lungs:    Clear to auscultation bilaterally; normal respiratory effort CV:         Regular rate and rhythm; no murmurs Abd:      + bowel sounds; soft, non-tender; no palpable masses, no distension Ext:    No edema; adequate peripheral perfusion Skin:      Warm and dry; no rash Neuro: alert and oriented x 3 Psych: normal mood and affect  Data Reviewed:  Reviewed labs, radiology imaging, old records and pertinent past GI work up   Assessment and Plan/Recommendations: 56 year old female with history of Crohn's disease status post terminal ileal resection, pancreatic insufficiency, on chronic narcotics here with complaints of persistent nausea, intermittent vomiting, and watery diarrhea C. difficile negative for toxin, antigen positive Likely chronic carrier, we'll not treat at this point  Diarrhea could be secondary to under dosing of Creon, increase the dose to 2 capsules of 36,000 units with meals and one capsule with snack Advised patient to avoid simple sugars, high fructose corn syrup, lactose and artificial sweeteners Avoid sweet tea or soda  We will  do a short term trial with Reglan 5 mg before meals and at bedtime to see if it improves her symptoms (Nausea and vomiting) Patient continues to have persistent nausea and vomiting, will consider referral to [facility] for further management  Follow-up CBC, CMP, ESR, ferritin, B12 and folate    K. Denzil Magnuson , MD [phone] Mon-Fri 8a-5p [phone] after 5p, weekends, holidays  CC: No ref. provider found

## 2016-10-18 NOTE — Patient Instructions (Signed)
If you are age 57 or older, your body mass index should be between 23-30. Your Body mass index is 29.68 kg/m. If this is out of the aforementioned range listed, please consider follow up with your Primary Care Provider.  If you are age 43 or younger, your body mass index should be between 19-25. Your Body mass index is 29.68 kg/m. If this is out of the aformentioned range listed, please consider follow up with your Primary Care Provider.   We have sent the following medications to your pharmacy for you to pick up at your convenience:  [facility] has requested that you go to the basement for the lab work before leaving today.  Please avoid high fructose corn syrup and simple sugars.  Please follow up with our office in 1 month.

## 2016-10-19 LAB — CLOSTRIDIUM DIFFICILE BY PCR: Toxigenic C. Difficile by PCR: NOT DETECTED

## 2016-10-20 ENCOUNTER — Other Ambulatory Visit: Payer: Self-pay

## 2016-10-20 ENCOUNTER — Telehealth: Payer: Self-pay

## 2016-10-20 DIAGNOSIS — K50118 Crohn's disease of large intestine with other complication: Secondary | ICD-10-CM

## 2016-10-20 MED ORDER — CYANOCOBALAMIN 1000 MCG/ML IJ SOLN: 1000.0000 ug | INTRAMUSCULAR | 11 refills | Status: DC

## 2016-10-20 MED ORDER — CYANOCOBALAMIN 1000 MCG/ML IJ SOLN: INTRAMUSCULAR | 0 refills | Status: DC

## 2016-10-20 NOTE — Telephone Encounter (Signed)
Okay to do Reglan low dose 5 mg before meals and at bedtime for 2 weeks . She'll have to contact preferred pain management for pain medications

## 2016-10-20 NOTE — Telephone Encounter (Signed)
Pharmacist notified.

## 2016-10-20 NOTE — Telephone Encounter (Signed)
1)Pharmacist cannot release the Reglan to the patient without permission due to a drug to drug interaction between Reglan and Prilosec.  2) the spouse is asking about pain medication

## 2016-10-25 ENCOUNTER — Encounter (HOSPITAL_COMMUNITY)
Admission: RE | Admit: 2016-10-25 | Discharge: 2016-10-25 | Disposition: A | Discharge status: Home / Self Care | Payer: BLUE CROSS/BLUE SHIELD | Source: Ambulatory Visit | Attending: Gastroenterology | Admitting: Gastroenterology

## 2016-10-25 ENCOUNTER — Other Ambulatory Visit: Payer: Self-pay | Admitting: Gastroenterology

## 2016-10-25 ENCOUNTER — Encounter (HOSPITAL_COMMUNITY): Payer: Self-pay

## 2016-10-25 DIAGNOSIS — K50119 Crohn's disease of large intestine with unspecified complications: Secondary | ICD-10-CM | POA: Insufficient documentation

## 2016-10-25 DIAGNOSIS — K50118 Crohn's disease of large intestine with other complication: Secondary | ICD-10-CM

## 2016-10-25 MED ORDER — VEDOLIZUMAB 300 MG IV SOLR
300.0000 mg | Freq: Once | INTRAVENOUS | Status: AC
  Administered 2016-10-25: 300 mg via INTRAVENOUS
  Filled 2016-10-25: qty 5

## 2016-10-25 MED ORDER — SODIUM CHLORIDE 0.9 % IV SOLN
Freq: Once | INTRAVENOUS | Status: AC
  Administered 2016-10-25: 10:00:00 via INTRAVENOUS

## 2016-10-25 NOTE — Discharge Instructions (Signed)
Vedolizumab injection solution What is this medicine? VEDOLIZUMAB (Ve doe LIZ you mab) is used to treat ulcerative colitis and Crohn's disease in adult patients. This medicine may be used for other purposes; ask your health care provider or pharmacist if you have questions. COMMON BRAND NAME(S): Entyvio What should I tell my health care provider before I take this medicine? They need to know if you have any of these conditions: -diabetes -hepatitis B or history of hepatitis B infection -HIV or AIDS -immune system problems -infection or history of infections -liver disease -recently received or scheduled to receive a vaccine -scheduled to have surgery -tuberculosis, a positive skin test for tuberculosis or have recently been in close contact with someone who has tuberculosis - an unusual or allergic reaction to vedolizumab, other medicines, foods, dyes, or preservatives -pregnant or trying to get pregnant -breast-feeding How should I use this medicine? This medicine is for infusion into a vein. It is given by a health care professional in a hospital or clinic setting. A special MedGuide will be given to you by the pharmacist with each prescription and refill. Be sure to read this information carefully each time. Talk to your pediatrician regarding the use of this medicine in children. This medicine is not approved for use in children. Overdosage: If you think you have taken too much of this medicine contact a poison control center or emergency room at once. NOTE: This medicine is only for you. Do not share this medicine with others. What if I miss a dose? It is important not to miss your dose. Call your doctor or health care professional if you are unable to keep an appointment. What may interact with this medicine? -steroid medicines like prednisone or cortisone -TNF-alpha inhibitors like natalizumab, adalimumab, and infliximab -vaccines This list may not describe all possible  interactions. Give your health care provider a list of all the medicines, herbs, non-prescription drugs, or dietary supplements you use. Also tell them if you smoke, drink alcohol, or use illegal drugs. Some items may interact with your medicine. What should I watch for while using this medicine? Your condition will be monitored carefully while you are receiving this medicine. Visit your doctor for regular check ups. Tell your doctor or healthcare professional if your symptoms do not start to get better or if they get worse. Stay away from people who are sick. Call your doctor or health care professional for advice if you get a fever, chills or sore throat, or other symptoms of a cold or flu. Do not treat yourself. In some patients, this medicine may cause a serious brain infection that may cause death. If you have any problems seeing, thinking, speaking, walking, or standing, tell your doctor right away. If you cannot reach your doctor, get urgent medical care. What side effects may I notice from receiving this medicine? Side effects that you should report to your doctor or health care professional as soon as possible: -allergic reactions like skin rash, itching or hives, swelling of the face, lips, or tongue -breathing problems -changes in vision -chest pain -dark urine -depression, feelings of sadness -dizziness -general ill feeling or flu-like symptoms -irregular, missed, or painful menstrual periods -light-colored stools -loss of appetite, nausea -muscle weakness -problems with balance, talking, or walking -right upper belly pain -unusually weak or tired -yellowing of the eyes or skin Side effects that usually do not require medical attention (report to your doctor or health care professional if they continue or are bothersome): -aches, pains -headache -  stomach upset -tiredness This list may not describe all possible side effects. Call your doctor for medical advice about side  effects. You may report side effects to FDA at 1-800-FDA-1088. Where should I keep my medicine? This drug is given in a hospital or clinic and will not be stored at home. NOTE: This sheet is a summary. It may not cover all possible information. If you have questions about this medicine, talk to your doctor, pharmacist, or health care provider.  2018 Elsevier/Gold Standard (2015-03-26 08:36:51)

## 2016-11-03 ENCOUNTER — Telehealth: Payer: Self-pay | Admitting: Gastroenterology

## 2016-11-03 ENCOUNTER — Telehealth: Payer: Self-pay | Admitting: *Deleted

## 2016-11-03 MED ORDER — PANCRELIPASE (LIP-PROT-AMYL) 36000-114000 UNITS PO CPEP: ORAL_CAPSULE | ORAL | 6 refills | Status: DC

## 2016-11-03 NOTE — Telephone Encounter (Signed)
Creon sent to [facility] today electronically

## 2016-11-03 NOTE — Telephone Encounter (Signed)
Sent in prior auth to Cover My meds for Creon today Waiting on response

## 2016-11-08 ENCOUNTER — Ambulatory Visit: Payer: BLUE CROSS/BLUE SHIELD | Admitting: Neurology

## 2016-11-08 MED ORDER — PANCRELIPASE (LIP-PROT-AMYL) 36000-114000 UNITS PO CPEP: ORAL_CAPSULE | ORAL | 6 refills | Status: DC

## 2016-11-08 NOTE — Telephone Encounter (Signed)
Resent Creon to [facility] to try to get PA approved. Received denial for PA on cover my meds.

## 2016-11-09 NOTE — Telephone Encounter (Signed)
Gae Bon calling from [facility] states she has received all information and will be reaching out to the patient to establish delivery.

## 2016-11-11 DIAGNOSIS — M25559 Pain in unspecified hip: Secondary | ICD-10-CM | POA: Diagnosis not present

## 2016-11-11 DIAGNOSIS — M4696 Unspecified inflammatory spondylopathy, lumbar region: Secondary | ICD-10-CM | POA: Diagnosis not present

## 2016-11-11 DIAGNOSIS — Z79899 Other long term (current) drug therapy: Secondary | ICD-10-CM | POA: Diagnosis not present

## 2016-11-11 DIAGNOSIS — Z79891 Long term (current) use of opiate analgesic: Secondary | ICD-10-CM | POA: Diagnosis not present

## 2016-11-11 DIAGNOSIS — M5137 Other intervertebral disc degeneration, lumbosacral region: Secondary | ICD-10-CM | POA: Diagnosis not present

## 2016-11-11 DIAGNOSIS — G894 Chronic pain syndrome: Secondary | ICD-10-CM | POA: Diagnosis not present

## 2016-11-16 ENCOUNTER — Ambulatory Visit: Payer: BLUE CROSS/BLUE SHIELD | Admitting: Neurology

## 2016-11-16 DIAGNOSIS — Z029 Encounter for administrative examinations, unspecified: Secondary | ICD-10-CM

## 2016-11-21 DIAGNOSIS — Z23 Encounter for immunization: Secondary | ICD-10-CM | POA: Diagnosis not present

## 2016-11-21 DIAGNOSIS — K903 Pancreatic steatorrhea: Secondary | ICD-10-CM | POA: Diagnosis not present

## 2016-11-21 DIAGNOSIS — K9089 Other intestinal malabsorption: Secondary | ICD-10-CM | POA: Diagnosis not present

## 2016-11-21 DIAGNOSIS — K50018 Crohn's disease of small intestine with other complication: Secondary | ICD-10-CM | POA: Diagnosis not present

## 2016-11-23 ENCOUNTER — Telehealth: Payer: Self-pay | Admitting: Gastroenterology

## 2016-11-23 ENCOUNTER — Encounter: Payer: Self-pay | Admitting: Neurology

## 2016-11-23 ENCOUNTER — Ambulatory Visit: Payer: BLUE CROSS/BLUE SHIELD | Admitting: Gastroenterology

## 2016-11-23 NOTE — Progress Notes (Deleted)
[name]    [id number]    02-12-1961  Primary Care Physician:Cox, Elnita Maxwell, MD  Referring Physician: Rochel Brome, MD [address] [place], [place] [address]  Chief complaint:  ***  HPI:  ***   Outpatient Encounter Prescriptions as of 11/23/2016  Medication Sig  . albuterol (PROAIR HFA) 108 (90 BASE) MCG/ACT inhaler Inhale 2 puffs into the lungs every 4 (four) hours as needed for wheezing or shortness of breath.   . budesonide-formoterol (SYMBICORT) 80-4.5 MCG/ACT inhaler Inhale 2 puffs into the lungs daily as needed (SOB, wheezing).   . cholestyramine light (PREVALITE) 4 g packet Take 1 packet (4 g total) by mouth 2 (two) times daily. Before breakfast and dinner  . cyanocobalamin (,VITAMIN B-12,) 1000 MCG/ML injection Inject 1 mL (1,000 mcg total) into the skin every 30 (thirty) days.  Marland Kitchen econazole nitrate 1 % cream APPLY  CREAM TOPICALLY ONCE DAILY  . gabapentin (NEURONTIN) 400 MG capsule Take 1 capsule by mouth at lunch, 2 at dinner and 3 at bedtime  . hydrocortisone (PROCTOCORT) 1 % CREA Use rectally as needed  . lipase/protease/amylase (CREON) 36000 UNITS CPEP capsule 2 capsules (72,000) by mouth with meals and 1 (36,000) with snacks  . metoCLOPramide (REGLAN) 5 MG tablet Take 1 tablet (5 mg total) by mouth 4 (four) times daily. Take 1 tablet before each meal and 1 tablet at bedtime for 2 weeks.  . Needles & Syringes MISC 1 Syringe by Does not apply route every 30 (thirty) days.  Marland Kitchen omeprazole (PRILOSEC) 40 MG capsule Take 1 capsule (40 mg total) by mouth daily.  . Oxycodone HCl 10 MG TABS Take 10 mg by mouth See admin instructions. 1 tablet up to five times a day as needed for severe pain.  . promethazine (PHENERGAN) 25 MG suppository Place 1 suppository (25 mg total) rectally every 6 (six) hours as needed for nausea or vomiting.  . promethazine (PHENERGAN) 25 MG tablet Take 1 tablet (25 mg total) by mouth every 6 (six) hours as needed for nausea or  vomiting.  . propranolol ER (INDERAL LA) 80 MG 24 hr capsule Take 1 capsule by mouth daily.  Marland Kitchen tiZANidine (ZANAFLEX) 4 MG tablet Take 4 mg by mouth 3 (three) times daily.  Marland Kitchen topiramate (TOPAMAX) 50 MG tablet TAKE ONE TABLET BY MOUTH AT BEDTIME  . Vedolizumab (ENTYVIO IV) Inject into the vein every 8 (eight) weeks. Receives at [facility].  . zolmitriptan (ZOMIG) 5 MG nasal solution 1 spray in one nostril.  May repeat 1 spray once after 2 hours if needed. Not to exceed 2 sprays in 24 hours (Patient taking differently: Place 1 spray into the nose every 2 (two) hours as needed for migraine. 1 spray in one nostril.  May repeat 1 spray once after 2 hours if needed. Not to exceed 2 sprays in 24 hours)   No facility-administered encounter medications on file as of 11/23/2016.     Allergies as of 11/23/2016 - Review Complete 10/18/2016  Allergen Reaction Noted  . Codeine Hives and Nausea And Vomiting 08/31/2007  . Humira [adalimumab] Rash 12/24/2013    Past Medical History:  Diagnosis Date  . Arthritis   . Chronic diarrhea   . Chronic disease anemia   . Crohn's disease ([place])   . Depression   . Family history of malignant neoplasm of gastrointestinal tract   . GERD (gastroesophageal reflux disease)   . H/O hiatal hernia   .  History of adenomatous polyp of colon   . History of gastritis    AND HX ILEITIS  . History of kidney stones   . History of small bowel obstruction    SECONDARY CROHN'S STRICTURE  S/P COLECTOMY  . Hyperlipidemia   . Hypertension   . Hypopotassemia   . Internal hemorrhoids   . Kidney stones   . Pancreatic insufficiency   . PONV (postoperative nausea and vomiting)   . Psoriasis    SKIN  . Right ureteral stone   . Rotavirus infection 05/31/2013  . S/P dilatation of esophageal stricture   . Seasonal asthma   . Urgency of urination   . Vitamin B12 deficiency     Past Surgical History:  Procedure Laterality Date  . ANAL FISSURE REPAIR  1990's  . CARPAL TUNNEL  RELEASE Right 2000  . CHOLECYSTECTOMY  2002  . CYSTOSCOPY WITH RETROGRADE PYELOGRAM, URETEROSCOPY AND STENT PLACEMENT Left 03/08/2013   Procedure: CYSTOSCOPY WITH RETROGRADE PYELOGRAM, URETEROSCOPY AND STENT PLACEMENT stone basketing;  Surgeon: Alexis Frock, MD;  Location: [facility];  Service: Urology;  Laterality: Left;  . CYSTOSCOPY WITH RETROGRADE PYELOGRAM, URETEROSCOPY AND STENT PLACEMENT Right 04/03/2013   Procedure: CYSTOSCOPY WITH RETROGRADE PYELOGRAM, URETEROSCOPY AND STENT PLACEMENT;  Surgeon: Alexis Frock, MD;  Location: [facility];  Service: Urology;  Laterality: Right;  . DX LAPAROSCOPY CONVERTED TO OPEN RIGHT COLECCTOMY  09-15-2010   ANASTOMOTIC STRICTURE  . LAPAROSCOPIC ILEOCECECTOMY  10-09-2008   AND APPENDECTOMY (TERMINAL ILEITIS & PARTIAL SBO)  . VAGINAL HYSTERECTOMY  1992    Family History  Problem Relation Age of Onset  . Colon cancer Mother   . Colon polyps Mother   . Colon polyps Father   . Lung cancer Father   . Breast cancer Paternal Grandmother   . Colon polyps Brother   . Thyroid disease Neg Hx   . Stomach cancer Neg Hx   . Pancreatic cancer Neg Hx     Social History   Social History  . Marital status: Married    Spouse name: N/A  . Number of children: 2  . Years of education: N/A   Occupational History  . unemployed [employer]   Social History Main Topics  . Smoking status: Former Smoker    Packs/day: 1.00    Years: 10.00    Types: Cigarettes    Quit date: 03/07/1984  . Smokeless tobacco: Never Used  . Alcohol use No  . Drug use: No  . Sexual activity: Not on file   Other Topics Concern  . Not on file   Social History Narrative  . No narrative on file      Review of systems: Review of Systems  Constitutional: Negative for fever and chills.  HENT: Negative.   Eyes: Negative for blurred vision.  Respiratory: Negative for cough, shortness of breath and wheezing.   Cardiovascular: Negative for chest pain and  palpitations.  Gastrointestinal: as per HPI Genitourinary: Negative for dysuria, urgency, frequency and hematuria.  Musculoskeletal: Negative for myalgias, back pain and joint pain.  Skin: Negative for itching and rash.  Neurological: Negative for dizziness, tremors, focal weakness, seizures and loss of consciousness.  Endo/Heme/Allergies: Positive for seasonal allergies.  Psychiatric/Behavioral: Negative for depression, suicidal ideas and hallucinations.  All other systems reviewed and are negative.   Physical Exam: There were no vitals filed for this visit. There is no height or weight on file to calculate BMI. Gen:      No acute distress HEENT:  EOMI, sclera anicteric Neck:     No masses; no thyromegaly Lungs:    Clear to auscultation bilaterally; normal respiratory effort CV:         Regular rate and rhythm; no murmurs Abd:      + bowel sounds; soft, non-tender; no palpable masses, no distension Ext:    No edema; adequate peripheral perfusion Skin:      Warm and dry; no rash Neuro: alert and oriented x 3 Psych: normal mood and affect  Data Reviewed:  Reviewed labs, radiology imaging, old records and pertinent past GI work up   Assessment and Plan/Recommendations:  ***  25 minutes was spent face-to-face with the patient. Greater than 50% of the time used for counseling as well as treatment plan and follow-up. She had multiple questions which were answered to her satisfaction  K. Denzil Magnuson , MD [phone] Mon-Fri 8a-5p [phone] after 5p, weekends, holidays  CC: Rochel Brome, MD

## 2016-11-23 NOTE — Telephone Encounter (Signed)
No charge. 

## 2016-11-30 NOTE — Telephone Encounter (Signed)
Creon Approved  Until 11/24/2017

## 2016-12-09 DIAGNOSIS — Z79899 Other long term (current) drug therapy: Secondary | ICD-10-CM | POA: Diagnosis not present

## 2016-12-09 DIAGNOSIS — G629 Polyneuropathy, unspecified: Secondary | ICD-10-CM | POA: Diagnosis not present

## 2016-12-09 DIAGNOSIS — G894 Chronic pain syndrome: Secondary | ICD-10-CM | POA: Diagnosis not present

## 2016-12-09 DIAGNOSIS — M47817 Spondylosis without myelopathy or radiculopathy, lumbosacral region: Secondary | ICD-10-CM | POA: Diagnosis not present

## 2016-12-09 DIAGNOSIS — Z79891 Long term (current) use of opiate analgesic: Secondary | ICD-10-CM | POA: Diagnosis not present

## 2016-12-09 DIAGNOSIS — M5137 Other intervertebral disc degeneration, lumbosacral region: Secondary | ICD-10-CM | POA: Diagnosis not present

## 2016-12-12 ENCOUNTER — Other Ambulatory Visit: Payer: Self-pay

## 2016-12-12 DIAGNOSIS — K5010 Crohn's disease of large intestine without complications: Secondary | ICD-10-CM

## 2016-12-19 DIAGNOSIS — M7060 Trochanteric bursitis, unspecified hip: Secondary | ICD-10-CM | POA: Diagnosis not present

## 2016-12-20 ENCOUNTER — Encounter (HOSPITAL_COMMUNITY): Payer: Self-pay

## 2016-12-20 ENCOUNTER — Encounter (HOSPITAL_COMMUNITY)
Admission: RE | Admit: 2016-12-20 | Discharge: 2016-12-20 | Disposition: A | Discharge status: Home / Self Care | Payer: BLUE CROSS/BLUE SHIELD | Source: Ambulatory Visit | Attending: Gastroenterology | Admitting: Gastroenterology

## 2016-12-20 DIAGNOSIS — K50119 Crohn's disease of large intestine with unspecified complications: Secondary | ICD-10-CM | POA: Diagnosis not present

## 2016-12-20 DIAGNOSIS — K5010 Crohn's disease of large intestine without complications: Secondary | ICD-10-CM

## 2016-12-20 MED ORDER — VEDOLIZUMAB 300 MG IV SOLR
300.0000 mg | Freq: Once | INTRAVENOUS | Status: AC
  Administered 2016-12-20: 300 mg via INTRAVENOUS
  Filled 2016-12-20: qty 5

## 2016-12-20 MED ORDER — SODIUM CHLORIDE 0.9 % IV SOLN
INTRAVENOUS | Status: AC
  Administered 2016-12-20: 09:00:00 via INTRAVENOUS

## 2016-12-20 NOTE — Discharge Instructions (Signed)
Vedolizumab injection solution/Entyvio Infusion What is this medicine? VEDOLIZUMAB (Ve doe LIZ you mab) is used to treat ulcerative colitis and Crohn's disease in adult patients. This medicine may be used for other purposes; ask your health care provider or pharmacist if you have questions. COMMON BRAND NAME(S): Entyvio What should I tell my health care provider before I take this medicine? They need to know if you have any of these conditions: -diabetes -hepatitis B or history of hepatitis B infection -HIV or AIDS -immune system problems -infection or history of infections -liver disease -recently received or scheduled to receive a vaccine -scheduled to have surgery -tuberculosis, a positive skin test for tuberculosis or have recently been in close contact with someone who has tuberculosis - an unusual or allergic reaction to vedolizumab, other medicines, foods, dyes, or preservatives -pregnant or trying to get pregnant -breast-feeding How should I use this medicine? This medicine is for infusion into a vein. It is given by a health care professional in a hospital or clinic setting. A special MedGuide will be given to you by the pharmacist with each prescription and refill. Be sure to read this information carefully each time. Talk to your pediatrician regarding the use of this medicine in children. This medicine is not approved for use in children. Overdosage: If you think you have taken too much of this medicine contact a poison control center or emergency room at once. NOTE: This medicine is only for you. Do not share this medicine with others. What if I miss a dose? It is important not to miss your dose. Call your doctor or health care professional if you are unable to keep an appointment. What may interact with this medicine? -steroid medicines like prednisone or cortisone -TNF-alpha inhibitors like natalizumab, adalimumab, and infliximab -vaccines This list may not describe all  possible interactions. Give your health care provider a list of all the medicines, herbs, non-prescription drugs, or dietary supplements you use. Also tell them if you smoke, drink alcohol, or use illegal drugs. Some items may interact with your medicine. What should I watch for while using this medicine? Your condition will be monitored carefully while you are receiving this medicine. Visit your doctor for regular check ups. Tell your doctor or healthcare professional if your symptoms do not start to get better or if they get worse. Stay away from people who are sick. Call your doctor or health care professional for advice if you get a fever, chills or sore throat, or other symptoms of a cold or flu. Do not treat yourself. In some patients, this medicine may cause a serious brain infection that may cause death. If you have any problems seeing, thinking, speaking, walking, or standing, tell your doctor right away. If you cannot reach your doctor, get urgent medical care. What side effects may I notice from receiving this medicine? Side effects that you should report to your doctor or health care professional as soon as possible: -allergic reactions like skin rash, itching or hives, swelling of the face, lips, or tongue -breathing problems -changes in vision -chest pain -dark urine -depression, feelings of sadness -dizziness -general ill feeling or flu-like symptoms -irregular, missed, or painful menstrual periods -light-colored stools -loss of appetite, nausea -muscle weakness -problems with balance, talking, or walking -right upper belly pain -unusually weak or tired -yellowing of the eyes or skin Side effects that usually do not require medical attention (report to your doctor or health care professional if they continue or are bothersome): -aches, pains -  headache -stomach upset -tiredness This list may not describe all possible side effects. Call your doctor for medical advice about  side effects. You may report side effects to FDA at 1-800-FDA-1088. Where should I keep my medicine? This drug is given in a hospital or clinic and will not be stored at home. NOTE: This sheet is a summary. It may not cover all possible information. If you have questions about this medicine, talk to your doctor, pharmacist, or health care provider.  2018 Elsevier/Gold Standard (2015-03-26 08:36:51)

## 2017-01-02 DIAGNOSIS — Z79899 Other long term (current) drug therapy: Secondary | ICD-10-CM | POA: Diagnosis not present

## 2017-01-02 DIAGNOSIS — Z79891 Long term (current) use of opiate analgesic: Secondary | ICD-10-CM | POA: Diagnosis not present

## 2017-01-02 DIAGNOSIS — G629 Polyneuropathy, unspecified: Secondary | ICD-10-CM | POA: Diagnosis not present

## 2017-01-02 DIAGNOSIS — M5137 Other intervertebral disc degeneration, lumbosacral region: Secondary | ICD-10-CM | POA: Diagnosis not present

## 2017-01-02 DIAGNOSIS — M47817 Spondylosis without myelopathy or radiculopathy, lumbosacral region: Secondary | ICD-10-CM | POA: Diagnosis not present

## 2017-01-02 DIAGNOSIS — M47816 Spondylosis without myelopathy or radiculopathy, lumbar region: Secondary | ICD-10-CM | POA: Diagnosis not present

## 2017-01-02 DIAGNOSIS — G894 Chronic pain syndrome: Secondary | ICD-10-CM | POA: Diagnosis not present

## 2017-01-04 ENCOUNTER — Ambulatory Visit (INDEPENDENT_AMBULATORY_CARE_PROVIDER_SITE_OTHER): Payer: BLUE CROSS/BLUE SHIELD | Admitting: Nurse Practitioner

## 2017-01-04 ENCOUNTER — Encounter: Payer: Self-pay | Admitting: Nurse Practitioner

## 2017-01-04 VITALS — BP 134/84 | HR 65 | Ht 67.0 in | Wt 179.0 lb

## 2017-01-04 DIAGNOSIS — R112 Nausea with vomiting, unspecified: Secondary | ICD-10-CM | POA: Diagnosis not present

## 2017-01-04 DIAGNOSIS — K50919 Crohn's disease, unspecified, with unspecified complications: Secondary | ICD-10-CM

## 2017-01-04 DIAGNOSIS — R197 Diarrhea, unspecified: Secondary | ICD-10-CM | POA: Diagnosis not present

## 2017-01-04 DIAGNOSIS — K50819 Crohn's disease of both small and large intestine with unspecified complications: Secondary | ICD-10-CM

## 2017-01-04 MED ORDER — METOCLOPRAMIDE HCL 5 MG PO TABS: 5.0000 mg | ORAL_TABLET | Freq: Four times a day (QID) | ORAL | 3 refills | Status: DC

## 2017-01-04 MED ORDER — CYANOCOBALAMIN 1000 MCG/ML IJ SOLN: 1000.0000 ug | INTRAMUSCULAR | 5 refills | Status: DC

## 2017-01-04 NOTE — Progress Notes (Signed)
Chief Complaint:  Follow up   HPI: Patient is a 56 yo female with small and llarge bowel Crohn's and pancreatic insufficiency well known to Dr. Silverio Decamp. She was supposed to see Dr. Silverio Decamp but appt had to be rescheduled so she is here to see me. [name] is s/p TI resection in 2008 and 2012. She has a hx of steroid dependence but was able to get off steroids and is maintained on Entyvio. 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 [name] has chronic nausea, vomiting and diarrhea which resulted in a prolonged admission several months ago.Initiall imaging suggested SBO, follow up imaging suggested ileus instead..  She had follow up a normal CT enterography which was difficult to obtain due to her inability to tolerate contrast. We were able to get it done by giving contrast through an NT tube. Last Entyvio was two weeks ago.  She is having 5-10 BMs daily which is baseline for her. No blood in stools.  Appetite is fair, eating small frequent meals. Continues to vomit but only about 3 times a week now. She stopped eating sugar and pizza which has helped significantly with the vomiting. She is taking Reglan, requests refill.      Past Medical History:  Diagnosis Date  . Arthritis   . Chronic diarrhea   . Chronic disease anemia   . Crohn's disease ([place])   . Depression   . Family history of malignant neoplasm of gastrointestinal tract   . GERD (gastroesophageal reflux disease)   . H/O hiatal hernia   . History of adenomatous polyp of colon   . History of gastritis    AND HX ILEITIS  . History of kidney stones   . History of small bowel obstruction    SECONDARY CROHN'S STRICTURE  S/P COLECTOMY  . Hyperlipidemia   . Hypertension   . Hypopotassemia   . Internal hemorrhoids   . Kidney stones   . Pancreatic insufficiency   . PONV (postoperative nausea and vomiting)   . Psoriasis    SKIN  . Right ureteral stone   . Rotavirus infection 05/31/2013   . S/P dilatation of esophageal stricture   . Seasonal asthma   . Urgency of urination   . Vitamin B12 deficiency     Patient's surgical history, family medical history, social history, medications and allergies were all reviewed in Epic    Physical Exam: BP 134/84   Pulse 65   Ht 5' 7"  (1.702 m)   Wt 179 lb (81.2 kg)   BMI 28.04 kg/m   GENERAL: well developed white female in NAD PSYCH: :Pleasant, cooperative, normal affect EENT:  conjunctiva pink, mucous membranes moist, neck supple without masses CARDIAC:  RRR, no murmur heard, no peripheral edema PULM: Normal respiratory effort, lungs CTA bilaterally, no wheezing ABDOMEN:  Nondistended, soft, nontender. No obvious masses, no hepatomegaly,  normal bowel sounds SKIN:  turgor, no lesions seen Musculoskeletal:  Normal muscle tone, normal strength NEURO: Alert and oriented x 3, no focal neurologic deficits   ASSESSMENT and PLAN:  1. Pleasant 56 year old female with large and small bowel Crohn's disease, s/p TI resection x 2. Maintained on Entyvio. She has chronic nausea, vomiting and diarrhea. Here for routine follow up..  -she is doing amazingly well. Vomits much less frequently since stopping sugars and pizza.  -continue scheduled Entyvioi infusions.  -refill B12 and Reglan per patient request -follow up with Dr. Silverio Decamp in 6 months.  2. Pancreatic insufficiency. Continue pancreatic enzymes.   3. Hx of adenomatous colon polyps on last colonoscopy in March 2016. Due for surveillance March 2019  Tye Savoy , NP 01/04/2017, 10:54 AM

## 2017-01-04 NOTE — Patient Instructions (Signed)
If you are age 57 or older, your body mass index should be between 23-30. Your Body mass index is 28.04 kg/m. If this is out of the aforementioned range listed, please consider follow up with your Primary Care Provider.  If you are age 22 or younger, your body mass index should be between 19-25. Your Body mass index is 28.04 kg/m. If this is out of the aformentioned range listed, please consider follow up with your Primary Care Provider.   We have sent the following medications to your pharmacy for you to pick up at your convenience: Reglan B 12  Follow up with Dr. Silverio Decamp in 6 months.  We will put a recall in your chart for an appointment since schedule is not available at this time.  Thank you for choosing me and [facility].   Tye Savoy, NP

## 2017-01-09 ENCOUNTER — Encounter: Payer: Self-pay | Admitting: Nurse Practitioner

## 2017-01-10 ENCOUNTER — Other Ambulatory Visit: Payer: Self-pay | Admitting: Gastroenterology

## 2017-01-13 NOTE — Progress Notes (Signed)
Reviewed and agree with documentation and assessment and plan. K. Veena Nandigam , MD   

## 2017-01-19 DIAGNOSIS — K50818 Crohn's disease of both small and large intestine with other complication: Secondary | ICD-10-CM | POA: Diagnosis not present

## 2017-01-19 DIAGNOSIS — I700 Atherosclerosis of aorta: Secondary | ICD-10-CM | POA: Diagnosis not present

## 2017-01-19 DIAGNOSIS — I10 Essential (primary) hypertension: Secondary | ICD-10-CM | POA: Diagnosis not present

## 2017-01-19 DIAGNOSIS — F5102 Adjustment insomnia: Secondary | ICD-10-CM | POA: Diagnosis not present

## 2017-01-23 DIAGNOSIS — M47816 Spondylosis without myelopathy or radiculopathy, lumbar region: Secondary | ICD-10-CM | POA: Diagnosis not present

## 2017-01-30 DIAGNOSIS — G629 Polyneuropathy, unspecified: Secondary | ICD-10-CM | POA: Diagnosis not present

## 2017-01-30 DIAGNOSIS — M5137 Other intervertebral disc degeneration, lumbosacral region: Secondary | ICD-10-CM | POA: Diagnosis not present

## 2017-01-30 DIAGNOSIS — Z79899 Other long term (current) drug therapy: Secondary | ICD-10-CM | POA: Diagnosis not present

## 2017-01-30 DIAGNOSIS — M47817 Spondylosis without myelopathy or radiculopathy, lumbosacral region: Secondary | ICD-10-CM | POA: Diagnosis not present

## 2017-01-30 DIAGNOSIS — G894 Chronic pain syndrome: Secondary | ICD-10-CM | POA: Diagnosis not present

## 2017-01-30 DIAGNOSIS — Z79891 Long term (current) use of opiate analgesic: Secondary | ICD-10-CM | POA: Diagnosis not present

## 2017-02-08 ENCOUNTER — Other Ambulatory Visit: Payer: Self-pay

## 2017-02-08 DIAGNOSIS — K50919 Crohn's disease, unspecified, with unspecified complications: Secondary | ICD-10-CM

## 2017-02-14 ENCOUNTER — Encounter (HOSPITAL_COMMUNITY): Payer: Self-pay

## 2017-02-14 ENCOUNTER — Encounter (HOSPITAL_COMMUNITY)
Admission: RE | Admit: 2017-02-14 | Discharge: 2017-02-14 | Disposition: A | Discharge status: Home / Self Care | Payer: BLUE CROSS/BLUE SHIELD | Source: Ambulatory Visit | Attending: Gastroenterology | Admitting: Gastroenterology

## 2017-02-14 DIAGNOSIS — K50119 Crohn's disease of large intestine with unspecified complications: Secondary | ICD-10-CM | POA: Diagnosis not present

## 2017-02-14 DIAGNOSIS — K50919 Crohn's disease, unspecified, with unspecified complications: Secondary | ICD-10-CM

## 2017-02-14 MED ORDER — SODIUM CHLORIDE 0.9 % IV SOLN
INTRAVENOUS | Status: DC
  Administered 2017-02-14: 09:00:00 via INTRAVENOUS

## 2017-02-14 MED ORDER — SODIUM CHLORIDE 0.9 % IV SOLN
300.0000 mg | Freq: Once | INTRAVENOUS | Status: AC
  Administered 2017-02-14: 300 mg via INTRAVENOUS
  Filled 2017-02-14: qty 5

## 2017-02-14 NOTE — Discharge Instructions (Signed)
Entyvio Vedolizumab injection solution What is this medicine? VEDOLIZUMAB (Ve doe LIZ you mab) is used to treat ulcerative colitis and Crohn's disease in adult patients. This medicine may be used for other purposes; ask your health care provider or pharmacist if you have questions. COMMON BRAND NAME(S): Entyvio What should I tell my health care provider before I take this medicine? They need to know if you have any of these conditions: -diabetes -hepatitis B or history of hepatitis B infection -HIV or AIDS -immune system problems -infection or history of infections -liver disease -recently received or scheduled to receive a vaccine -scheduled to have surgery -tuberculosis, a positive skin test for tuberculosis or have recently been in close contact with someone who has tuberculosis - an unusual or allergic reaction to vedolizumab, other medicines, foods, dyes, or preservatives -pregnant or trying to get pregnant -breast-feeding How should I use this medicine? This medicine is for infusion into a vein. It is given by a health care professional in a hospital or clinic setting. A special MedGuide will be given to you by the pharmacist with each prescription and refill. Be sure to read this information carefully each time. Talk to your pediatrician regarding the use of this medicine in children. This medicine is not approved for use in children. Overdosage: If you think you have taken too much of this medicine contact a poison control center or emergency room at once. NOTE: This medicine is only for you. Do not share this medicine with others. What if I miss a dose? It is important not to miss your dose. Call your doctor or health care professional if you are unable to keep an appointment. What may interact with this medicine? -steroid medicines like prednisone or cortisone -TNF-alpha inhibitors like natalizumab, adalimumab, and infliximab -vaccines This list may not describe all  possible interactions. Give your health care provider a list of all the medicines, herbs, non-prescription drugs, or dietary supplements you use. Also tell them if you smoke, drink alcohol, or use illegal drugs. Some items may interact with your medicine. What should I watch for while using this medicine? Your condition will be monitored carefully while you are receiving this medicine. Visit your doctor for regular check ups. Tell your doctor or healthcare professional if your symptoms do not start to get better or if they get worse. Stay away from people who are sick. Call your doctor or health care professional for advice if you get a fever, chills or sore throat, or other symptoms of a cold or flu. Do not treat yourself. In some patients, this medicine may cause a serious brain infection that may cause death. If you have any problems seeing, thinking, speaking, walking, or standing, tell your doctor right away. If you cannot reach your doctor, get urgent medical care. What side effects may I notice from receiving this medicine? Side effects that you should report to your doctor or health care professional as soon as possible: -allergic reactions like skin rash, itching or hives, swelling of the face, lips, or tongue -breathing problems -changes in vision -chest pain -dark urine -depression, feelings of sadness -dizziness -general ill feeling or flu-like symptoms -irregular, missed, or painful menstrual periods -light-colored stools -loss of appetite, nausea -muscle weakness -problems with balance, talking, or walking -right upper belly pain -unusually weak or tired -yellowing of the eyes or skin Side effects that usually do not require medical attention (report to your doctor or health care professional if they continue or are bothersome): -aches, pains -  headache -stomach upset -tiredness This list may not describe all possible side effects. Call your doctor for medical advice about  side effects. You may report side effects to FDA at 1-800-FDA-1088. Where should I keep my medicine? This drug is given in a hospital or clinic and will not be stored at home. NOTE: This sheet is a summary. It may not cover all possible information. If you have questions about this medicine, talk to your doctor, pharmacist, or health care provider.  2018 Elsevier/Gold Standard (2015-03-26 08:36:51)

## 2017-03-01 ENCOUNTER — Emergency Department (HOSPITAL_COMMUNITY): Payer: BLUE CROSS/BLUE SHIELD

## 2017-03-01 ENCOUNTER — Other Ambulatory Visit: Payer: Self-pay

## 2017-03-01 ENCOUNTER — Inpatient Hospital Stay (HOSPITAL_COMMUNITY)
Admission: EM | Admit: 2017-03-01 | Discharge: 2017-03-04 | DRG: 386 | Disposition: A | Discharge status: Home / Self Care | Payer: BLUE CROSS/BLUE SHIELD | Attending: Family Medicine | Admitting: Family Medicine

## 2017-03-01 ENCOUNTER — Encounter (HOSPITAL_COMMUNITY): Payer: Self-pay | Admitting: Nurse Practitioner

## 2017-03-01 ENCOUNTER — Observation Stay (HOSPITAL_COMMUNITY): Payer: BLUE CROSS/BLUE SHIELD

## 2017-03-01 DIAGNOSIS — Z6826 Body mass index (BMI) 26.0-26.9, adult: Secondary | ICD-10-CM | POA: Diagnosis not present

## 2017-03-01 DIAGNOSIS — R112 Nausea with vomiting, unspecified: Secondary | ICD-10-CM | POA: Diagnosis not present

## 2017-03-01 DIAGNOSIS — M47817 Spondylosis without myelopathy or radiculopathy, lumbosacral region: Secondary | ICD-10-CM | POA: Diagnosis not present

## 2017-03-01 DIAGNOSIS — E440 Moderate protein-calorie malnutrition: Secondary | ICD-10-CM

## 2017-03-01 DIAGNOSIS — R197 Diarrhea, unspecified: Secondary | ICD-10-CM | POA: Diagnosis not present

## 2017-03-01 DIAGNOSIS — Z885 Allergy status to narcotic agent status: Secondary | ICD-10-CM | POA: Diagnosis not present

## 2017-03-01 DIAGNOSIS — E785 Hyperlipidemia, unspecified: Secondary | ICD-10-CM | POA: Diagnosis not present

## 2017-03-01 DIAGNOSIS — E876 Hypokalemia: Secondary | ICD-10-CM | POA: Diagnosis present

## 2017-03-01 DIAGNOSIS — L409 Psoriasis, unspecified: Secondary | ICD-10-CM | POA: Diagnosis not present

## 2017-03-01 DIAGNOSIS — E538 Deficiency of other specified B group vitamins: Secondary | ICD-10-CM | POA: Diagnosis not present

## 2017-03-01 DIAGNOSIS — M25559 Pain in unspecified hip: Secondary | ICD-10-CM | POA: Diagnosis not present

## 2017-03-01 DIAGNOSIS — R1030 Lower abdominal pain, unspecified: Secondary | ICD-10-CM

## 2017-03-01 DIAGNOSIS — Z87891 Personal history of nicotine dependence: Secondary | ICD-10-CM | POA: Diagnosis not present

## 2017-03-01 DIAGNOSIS — G8929 Other chronic pain: Secondary | ICD-10-CM | POA: Diagnosis present

## 2017-03-01 DIAGNOSIS — M5137 Other intervertebral disc degeneration, lumbosacral region: Secondary | ICD-10-CM | POA: Diagnosis not present

## 2017-03-01 DIAGNOSIS — K3184 Gastroparesis: Secondary | ICD-10-CM | POA: Diagnosis present

## 2017-03-01 DIAGNOSIS — F329 Major depressive disorder, single episode, unspecified: Secondary | ICD-10-CM | POA: Diagnosis present

## 2017-03-01 DIAGNOSIS — Z7952 Long term (current) use of systemic steroids: Secondary | ICD-10-CM

## 2017-03-01 DIAGNOSIS — R1110 Vomiting, unspecified: Secondary | ICD-10-CM | POA: Diagnosis not present

## 2017-03-01 DIAGNOSIS — K5090 Crohn's disease, unspecified, without complications: Principal | ICD-10-CM | POA: Diagnosis present

## 2017-03-01 DIAGNOSIS — Z79899 Other long term (current) drug therapy: Secondary | ICD-10-CM

## 2017-03-01 DIAGNOSIS — K5000 Crohn's disease of small intestine without complications: Secondary | ICD-10-CM

## 2017-03-01 DIAGNOSIS — J45909 Unspecified asthma, uncomplicated: Secondary | ICD-10-CM | POA: Diagnosis present

## 2017-03-01 DIAGNOSIS — K8689 Other specified diseases of pancreas: Secondary | ICD-10-CM | POA: Diagnosis not present

## 2017-03-01 DIAGNOSIS — K861 Other chronic pancreatitis: Secondary | ICD-10-CM | POA: Diagnosis not present

## 2017-03-01 DIAGNOSIS — K50818 Crohn's disease of both small and large intestine with other complication: Secondary | ICD-10-CM | POA: Diagnosis not present

## 2017-03-01 DIAGNOSIS — K50119 Crohn's disease of large intestine with unspecified complications: Secondary | ICD-10-CM | POA: Diagnosis not present

## 2017-03-01 DIAGNOSIS — K5010 Crohn's disease of large intestine without complications: Secondary | ICD-10-CM | POA: Diagnosis present

## 2017-03-01 DIAGNOSIS — D638 Anemia in other chronic diseases classified elsewhere: Secondary | ICD-10-CM | POA: Diagnosis not present

## 2017-03-01 DIAGNOSIS — R634 Abnormal weight loss: Secondary | ICD-10-CM | POA: Diagnosis present

## 2017-03-01 DIAGNOSIS — Z79891 Long term (current) use of opiate analgesic: Secondary | ICD-10-CM | POA: Diagnosis not present

## 2017-03-01 DIAGNOSIS — R109 Unspecified abdominal pain: Secondary | ICD-10-CM | POA: Diagnosis present

## 2017-03-01 DIAGNOSIS — K529 Noninfective gastroenteritis and colitis, unspecified: Secondary | ICD-10-CM

## 2017-03-01 DIAGNOSIS — K219 Gastro-esophageal reflux disease without esophagitis: Secondary | ICD-10-CM | POA: Diagnosis not present

## 2017-03-01 DIAGNOSIS — Z8719 Personal history of other diseases of the digestive system: Secondary | ICD-10-CM

## 2017-03-01 DIAGNOSIS — G894 Chronic pain syndrome: Secondary | ICD-10-CM | POA: Diagnosis not present

## 2017-03-01 DIAGNOSIS — I10 Essential (primary) hypertension: Secondary | ICD-10-CM | POA: Diagnosis present

## 2017-03-01 LAB — URINALYSIS, ROUTINE W REFLEX MICROSCOPIC
Bilirubin Urine: NEGATIVE
GLUCOSE, UA: NEGATIVE mg/dL
Hgb urine dipstick: NEGATIVE
KETONES UR: 5 mg/dL — AB
Leukocytes, UA: NEGATIVE
Nitrite: NEGATIVE
PH: 5 (ref 5.0–8.0)
Protein, ur: 30 mg/dL — AB
RBC / HPF: NONE SEEN RBC/hpf (ref 0–5)
SPECIFIC GRAVITY, URINE: 1.026 (ref 1.005–1.030)

## 2017-03-01 LAB — COMPREHENSIVE METABOLIC PANEL
ALK PHOS: 105 U/L (ref 38–126)
ALT: 39 U/L (ref 14–54)
AST: 43 U/L — ABNORMAL HIGH (ref 15–41)
Albumin: 3.7 g/dL (ref 3.5–5.0)
Anion gap: 12 (ref 5–15)
BUN: 11 mg/dL (ref 6–20)
CALCIUM: 9.8 mg/dL (ref 8.9–10.3)
CO2: 26 mmol/L (ref 22–32)
CREATININE: 0.73 mg/dL (ref 0.44–1.00)
Chloride: 99 mmol/L — ABNORMAL LOW (ref 101–111)
Glucose, Bld: 116 mg/dL — ABNORMAL HIGH (ref 65–99)
Potassium: 2.9 mmol/L — ABNORMAL LOW (ref 3.5–5.1)
Sodium: 137 mmol/L (ref 135–145)
Total Bilirubin: 0.6 mg/dL (ref 0.3–1.2)
Total Protein: 9.7 g/dL — ABNORMAL HIGH (ref 6.5–8.1)

## 2017-03-01 LAB — CBC
HCT: 40.3 % (ref 36.0–46.0)
Hemoglobin: 13.3 g/dL (ref 12.0–15.0)
MCH: 28.4 pg (ref 26.0–34.0)
MCHC: 33 g/dL (ref 30.0–36.0)
MCV: 86.1 fL (ref 78.0–100.0)
PLATELETS: 361 10*3/uL (ref 150–400)
RBC: 4.68 MIL/uL (ref 3.87–5.11)
RDW: 11.9 % (ref 11.5–15.5)
WBC: 8.3 10*3/uL (ref 4.0–10.5)

## 2017-03-01 LAB — C-REACTIVE PROTEIN: CRP: 5.1 mg/dL — AB (ref <1.0)

## 2017-03-01 LAB — MAGNESIUM: Magnesium: 1.6 mg/dL — ABNORMAL LOW (ref 1.7–2.4)

## 2017-03-01 LAB — LIPASE, BLOOD: Lipase: 36 U/L (ref 11–51)

## 2017-03-01 MED ORDER — MAGNESIUM SULFATE 2 GM/50ML IV SOLN: 2.0000 g | Freq: Once | INTRAVENOUS | Status: DC

## 2017-03-01 MED ORDER — OXYCODONE HCL 5 MG PO TABS
10.0000 mg | ORAL_TABLET | Freq: Every day | ORAL | Status: DC | PRN
  Administered 2017-03-01 – 2017-03-04 (×10): 10 mg via ORAL
  Filled 2017-03-01 (×10): qty 2

## 2017-03-01 MED ORDER — POTASSIUM CHLORIDE IN NACL 20-0.9 MEQ/L-% IV SOLN
INTRAVENOUS | Status: AC
  Administered 2017-03-01: 18:00:00 via INTRAVENOUS
  Filled 2017-03-01: qty 1000

## 2017-03-01 MED ORDER — METRONIDAZOLE IN NACL 5-0.79 MG/ML-% IV SOLN
500.0000 mg | Freq: Three times a day (TID) | INTRAVENOUS | Status: DC
  Administered 2017-03-01: 500 mg via INTRAVENOUS
  Filled 2017-03-01: qty 100

## 2017-03-01 MED ORDER — PROMETHAZINE HCL 25 MG RE SUPP
25.0000 mg | Freq: Four times a day (QID) | RECTAL | Status: DC | PRN
Start: 2017-03-01 — End: 2017-03-02

## 2017-03-01 MED ORDER — MAGNESIUM SULFATE 2 GM/50ML IV SOLN
2.0000 g | Freq: Once | INTRAVENOUS | Status: AC
  Administered 2017-03-01: 2 g via INTRAVENOUS
  Filled 2017-03-01: qty 50

## 2017-03-01 MED ORDER — SODIUM CHLORIDE 0.9 % IV BOLUS (SEPSIS)
1000.0000 mL | Freq: Once | INTRAVENOUS | Status: AC
  Administered 2017-03-01: 1000 mL via INTRAVENOUS

## 2017-03-01 MED ORDER — SODIUM CHLORIDE 0.9 % IV SOLN
Freq: Once | INTRAVENOUS | Status: AC
  Administered 2017-03-01: 15:00:00 via INTRAVENOUS

## 2017-03-01 MED ORDER — SODIUM CHLORIDE 0.9 % IV SOLN
INTRAVENOUS | Status: DC
  Administered 2017-03-01: 15:00:00 via INTRAVENOUS

## 2017-03-01 MED ORDER — METOCLOPRAMIDE HCL 5 MG/ML IJ SOLN
10.0000 mg | Freq: Once | INTRAMUSCULAR | Status: AC
  Administered 2017-03-01: 10 mg via INTRAVENOUS
  Filled 2017-03-01: qty 2

## 2017-03-01 MED ORDER — METHYLPREDNISOLONE SODIUM SUCC 125 MG IJ SOLR
60.0000 mg | Freq: Two times a day (BID) | INTRAMUSCULAR | Status: DC
  Administered 2017-03-01 – 2017-03-02 (×2): 60 mg via INTRAVENOUS
  Filled 2017-03-01 (×2): qty 2

## 2017-03-01 MED ORDER — ONDANSETRON HCL 4 MG/2ML IJ SOLN
4.0000 mg | Freq: Once | INTRAMUSCULAR | Status: AC
  Administered 2017-03-01: 4 mg via INTRAVENOUS
  Filled 2017-03-01: qty 2

## 2017-03-01 MED ORDER — IOPAMIDOL (ISOVUE-300) INJECTION 61%
INTRAVENOUS | Status: AC
  Filled 2017-03-01: qty 100

## 2017-03-01 MED ORDER — TIZANIDINE HCL 4 MG PO TABS: 4.0000 mg | ORAL_TABLET | Freq: Three times a day (TID) | ORAL | Status: DC | PRN

## 2017-03-01 MED ORDER — NEEDLES & SYRINGES MISC: 1.0000 | Status: DC

## 2017-03-01 MED ORDER — POTASSIUM CHLORIDE 10 MEQ/100ML IV SOLN
10.0000 meq | INTRAVENOUS | Status: AC
  Administered 2017-03-01 (×3): 10 meq via INTRAVENOUS
  Filled 2017-03-01: qty 100

## 2017-03-01 MED ORDER — METOCLOPRAMIDE HCL 5 MG/ML IJ SOLN: 10.0000 mg | Freq: Four times a day (QID) | INTRAMUSCULAR | Status: DC

## 2017-03-01 MED ORDER — ONDANSETRON HCL 4 MG/2ML IJ SOLN
4.0000 mg | Freq: Four times a day (QID) | INTRAMUSCULAR | Status: DC | PRN
Start: 2017-03-01 — End: 2017-03-04
  Administered 2017-03-01 – 2017-03-02 (×3): 4 mg via INTRAVENOUS
  Filled 2017-03-01 (×3): qty 2

## 2017-03-01 MED ORDER — ONDANSETRON HCL 4 MG PO TABS: 4.0000 mg | ORAL_TABLET | Freq: Four times a day (QID) | ORAL | Status: DC | PRN

## 2017-03-01 MED ORDER — CYANOCOBALAMIN 1000 MCG/ML IJ SOLN
1000.0000 ug | INTRAMUSCULAR | Status: DC
  Administered 2017-03-01: 1000 ug via SUBCUTANEOUS
  Filled 2017-03-01: qty 1

## 2017-03-01 MED ORDER — DICYCLOMINE HCL 10 MG PO CAPS
10.0000 mg | ORAL_CAPSULE | Freq: Three times a day (TID) | ORAL | Status: DC
  Administered 2017-03-02 – 2017-03-04 (×8): 10 mg via ORAL
  Filled 2017-03-01 (×8): qty 1

## 2017-03-01 MED ORDER — IOPAMIDOL (ISOVUE-300) INJECTION 61%
100.0000 mL | Freq: Once | INTRAVENOUS | Status: AC | PRN
  Administered 2017-03-01: 100 mL via INTRAVENOUS

## 2017-03-01 MED ORDER — HYDROCORTISONE 2.5 % RE CREA
1.0000 "application " | TOPICAL_CREAM | Freq: Every day | RECTAL | Status: DC | PRN
  Filled 2017-03-01: qty 28.35

## 2017-03-01 MED ORDER — CIPROFLOXACIN IN D5W 400 MG/200ML IV SOLN
400.0000 mg | Freq: Two times a day (BID) | INTRAVENOUS | Status: DC
  Filled 2017-03-01: qty 200

## 2017-03-01 MED ORDER — ALBUTEROL SULFATE (2.5 MG/3ML) 0.083% IN NEBU: 2.5000 mg | INHALATION_SOLUTION | RESPIRATORY_TRACT | Status: DC | PRN

## 2017-03-01 MED ORDER — TOPIRAMATE 25 MG PO TABS
50.0000 mg | ORAL_TABLET | Freq: Every day | ORAL | Status: DC
  Administered 2017-03-01 – 2017-03-03 (×3): 50 mg via ORAL
  Filled 2017-03-01 (×3): qty 2

## 2017-03-01 MED ORDER — PROPRANOLOL HCL ER 80 MG PO CP24
80.0000 mg | ORAL_CAPSULE | Freq: Every day | ORAL | Status: DC
Start: 2017-03-02 — End: 2017-03-04
  Administered 2017-03-02 – 2017-03-04 (×3): 80 mg via ORAL
  Filled 2017-03-01 (×4): qty 1

## 2017-03-01 NOTE — ED Notes (Signed)
ED TO INPATIENT HANDOFF REPORT  Name/Age/Gender [name] 56 y.o. female  Code Status Code Status History    Date Active Date Inactive Code Status Order ID Comments User Context   07/19/2016 09:37 07/19/2016 20:10 Full Code [id number]  Willia Craze, NP Inpatient   05/24/2016 23:06 06/02/2016 19:57 Full Code [id number]  Karmen Bongo, MD Inpatient   02/20/2016 22:07 02/24/2016 13:53 Full Code [id number]  Sid Falcon, MD Inpatient   05/28/2013 11:19 06/01/2013 16:29 Full Code [id number]  Alfredia Ferguson, PA-C Inpatient      Home/SNF/Other Home  Chief Complaint Dehydration; Nausea  Level of Care/Admitting Diagnosis ED Disposition    ED Disposition Condition [place]: [facility] [[id number]]  Level of Care: Med-Surg [16]  Diagnosis: Crohn's colitis [facility]) [[id number]]  Admitting Physician: Phillips Grout [[id number]]  Attending Physician: Derrill Kay A [[id number]]  PT Class (Do Not Modify): Observation [104]  PT Acc Code (Do Not Modify): Observation [10022]       Medical History Past Medical History:  Diagnosis Date  . Arthritis   . Chronic diarrhea   . Chronic disease anemia   . Crohn's disease ([place])   . Depression   . Family history of malignant neoplasm of gastrointestinal tract   . GERD (gastroesophageal reflux disease)   . H/O hiatal hernia   . History of adenomatous polyp of colon   . History of gastritis    AND HX ILEITIS  . History of kidney stones   . History of small bowel obstruction    SECONDARY CROHN'S STRICTURE  S/P COLECTOMY  . Hyperlipidemia   . Hypertension   . Hypopotassemia   . Internal hemorrhoids   . Kidney stones   . Pancreatic insufficiency   . PONV (postoperative nausea and vomiting)   . Psoriasis    SKIN  . Right ureteral stone   . Rotavirus infection 05/31/2013  . S/P dilatation of esophageal stricture   . Seasonal asthma   . Urgency of urination   . Vitamin B12 deficiency      Allergies Allergies  Allergen Reactions  . Codeine Hives and Nausea And Vomiting  . Humira [Adalimumab] Rash    IV Location/Drains/Wounds Patient Lines/Drains/Airways Status   Active Line/Drains/Airways    Name:   Placement date:   Placement time:   Site:   Days:   Peripheral IV 03/01/17 Left Antecubital   03/01/17    1038    Antecubital   less than 1          Labs/Imaging Results for orders placed or performed during the hospital encounter of 03/01/17 (from the past 48 hour(s))  Lipase, blood     Status: None   Collection Time: 03/01/17 10:36 AM  Result Value Ref Range   Lipase 36 11 - 51 U/L  Comprehensive metabolic panel     Status: Abnormal   Collection Time: 03/01/17 10:36 AM  Result Value Ref Range   Sodium 137 135 - 145 mmol/L   Potassium 2.9 (L) 3.5 - 5.1 mmol/L   Chloride 99 (L) 101 - 111 mmol/L   CO2 26 22 - 32 mmol/L   Glucose, Bld 116 (H) 65 - 99 mg/dL   BUN 11 6 - 20 mg/dL   Creatinine, Ser 0.73 0.44 - 1.00 mg/dL   Calcium 9.8 8.9 - 10.3 mg/dL   Total Protein 9.7 (H) 6.5 - 8.1 g/dL   Albumin 3.7 3.5 - 5.0 g/dL   AST 43 (  H) 15 - 41 U/L   ALT 39 14 - 54 U/L   Alkaline Phosphatase 105 38 - 126 U/L   Total Bilirubin 0.6 0.3 - 1.2 mg/dL   GFR calc non Af Amer >60 >60 mL/min   GFR calc Af Amer >60 >60 mL/min    Comment: (NOTE) The eGFR has been calculated using the CKD EPI equation. This calculation has not been validated in all clinical situations. eGFR's persistently <60 mL/min signify possible Chronic Kidney Disease.    Anion gap 12 5 - 15  CBC     Status: None   Collection Time: 03/01/17 10:36 AM  Result Value Ref Range   WBC 8.3 4.0 - 10.5 K/uL   RBC 4.68 3.87 - 5.11 MIL/uL   Hemoglobin 13.3 12.0 - 15.0 g/dL   HCT 40.3 36.0 - 46.0 %   MCV 86.1 78.0 - 100.0 fL   MCH 28.4 26.0 - 34.0 pg   MCHC 33.0 30.0 - 36.0 g/dL   RDW 11.9 11.5 - 15.5 %   Platelets 361 150 - 400 K/uL  Magnesium     Status: Abnormal   Collection Time: 03/01/17 10:36 AM   Result Value Ref Range   Magnesium 1.6 (L) 1.7 - 2.4 mg/dL  Urinalysis, Routine w reflex microscopic     Status: Abnormal   Collection Time: 03/01/17 11:21 AM  Result Value Ref Range   Color, Urine YELLOW YELLOW   APPearance TURBID (A) CLEAR   Specific Gravity, Urine 1.026 1.005 - 1.030   pH 5.0 5.0 - 8.0   Glucose, UA NEGATIVE NEGATIVE mg/dL   Hgb urine dipstick NEGATIVE NEGATIVE   Bilirubin Urine NEGATIVE NEGATIVE   Ketones, ur 5 (A) NEGATIVE mg/dL   Protein, ur 30 (A) NEGATIVE mg/dL   Nitrite NEGATIVE NEGATIVE   Leukocytes, UA NEGATIVE NEGATIVE   RBC / HPF NONE SEEN 0 - 5 RBC/hpf   WBC, UA 0-5 0 - 5 WBC/hpf   Bacteria, UA MANY (A) NONE SEEN   Squamous Epithelial / LPF 0-5 (A) NONE SEEN   Mucus PRESENT    Ct Abdomen Pelvis W Contrast  Result Date: 03/01/2017 CLINICAL DATA:  History of Crohn's disease with fever vomiting and diarrhea EXAM: CT ABDOMEN AND PELVIS WITH CONTRAST TECHNIQUE: Multidetector CT imaging of the abdomen and pelvis was performed using the standard protocol following bolus administration of intravenous contrast. CONTRAST:  100 cc Isovue-300 intravenous COMPARISON:  07/19/2016, 05/24/2016, 02/20/2016 FINDINGS: Lower chest: Lung bases demonstrate no acute consolidation or pleural effusion. Normal heart size. Mild coronary calcification Hepatobiliary: No focal hepatic abnormality. Surgical clips at the gallbladder fossa. Stable prominent extrahepatic bile duct. Pancreas: Unremarkable. No pancreatic ductal dilatation or surrounding inflammatory changes. Spleen: Normal in size without focal abnormality. Adrenals/Urinary Tract: Adrenal glands are within normal limits. No hydronephrosis. Stable subcentimeter exophytic hypodensity mid left kidney. Bladder within normal limits Stomach/Bowel: The stomach is nonenlarged. No dilated small bowel. Re- demonstrated right abdominal post surgical changes consistent with distal small bowel and right colon resection. Fluid present within  the remainder of the colon. Prominent fatty hypertrophy consistent with chronic inflammatory process. Mild diffuse mucosal enhancement with mild wall thickening of the descending colon. Vascular/Lymphatic: Mild atherosclerotic calcification. No aneurysmal dilatation. No significantly enlarged lymph nodes Reproductive: Status post hysterectomy. No adnexal masses. Other: Negative for free air or free fluid. Musculoskeletal: Degenerative changes. No acute or suspicious abnormality. IMPRESSION: 1. Stable post- operative changes in the right abdomen without evidence for an obstruction 2. Fluid-filled residual colon with  prominent fatty wall thickening consistent with chronic inflammatory process. Mild diffuse mucosal enhancement of colon with mild wall thickening of the proximal descending colon, suspect mild degree of superimposed acute inflammation/colitis. Electronically Signed   By: Donavan Foil M.D.   On: 03/01/2017 15:54    Pending Labs Unresulted Labs (From admission, onward)   Start     Ordered   03/01/17 1515  C-reactive protein  Once,   R     03/01/17 1514   Signed and Held  Basic metabolic panel  Tomorrow morning,   R     Signed and Held   Signed and Held  CBC  Tomorrow morning,   R     Signed and Held   Signed and Held  Magnesium  Tomorrow morning,   R     Signed and Held      Vitals/Pain Today's Vitals   03/01/17 1530 03/01/17 1551 03/01/17 1555 03/01/17 1556  BP: (!) 149/71 (!) 154/67    Pulse: (!) 105 (!) 106    Resp: 19 18    Temp:   99.5 F (37.5 C)   TempSrc:   Oral   SpO2: 98% 98%    Weight:      Height:      PainSc:    8     Isolation Precautions No active isolations  Medications Medications  potassium chloride 10 mEq in 100 mL IVPB (10 mEq Intravenous New Bag/Given 03/01/17 1452)  metoCLOPramide (REGLAN) injection 10 mg (not administered)  sodium chloride 0.9 % bolus 1,000 mL (0 mLs Intravenous Stopped 03/01/17 1453)  sodium chloride 0.9 % bolus 1,000 mL (0  mLs Intravenous Stopped 03/01/17 1453)  ondansetron (ZOFRAN) injection 4 mg (4 mg Intravenous Given 03/01/17 1125)  metoCLOPramide (REGLAN) injection 10 mg (10 mg Intravenous Given 03/01/17 1448)  iopamidol (ISOVUE-300) 61 % injection 100 mL (100 mLs Intravenous Contrast Given 03/01/17 1538)  0.9 %  sodium chloride infusion ( Intravenous New Bag/Given 03/01/17 1520)    Mobility walks

## 2017-03-01 NOTE — ED Notes (Signed)
Hospitalist in pt room.

## 2017-03-01 NOTE — Consult Note (Addendum)
Consultation  Referring Provider: Dr. Billy Fischer     Primary Care Physician:  Rochel Brome, MD Primary Gastroenterologist: Dr. Silverio Decamp        Reason for Consultation:  H/o Crohn's, nausea, vomiting, diarrhea, lower abdominal pain            HPI:   [name] is a 56 y.o. female with a past medical history as listed below including pancreatic insufficiency and Crohn's disease s/p TI resection 2008 and 2012, who presented to the ER with 5 week history of nausea, vomiting , diarrhea and abdominal pain.    Today, the patient is accompanied by her family member who does assist with her history.  She explains that when she saw [name]  in the clinic she was doing "okay", but about 5 weeks ago she had an increase of her symptoms.  Patient tells me that she still has at least 7-10 loose stools per day.  She has also started with occasional intermittent bright red blood per rectum, which she tells me is from "irritation".  Along with this, the patient is "vomiting everything I eat or drink" over the past 5 weeks.  Her family member in the room and tells me she ihas only been able to eat no more than a "handful" of food for the past 5 weeks.  She has had at least a "21 pound weight loss" over this time.  Patient complains of constant nausea.  Patient also complains of a constant cramping and sometimes sharp bilateral lower abdominal pain which is worse before diarrheal bowel movement but not completely relieved afterwards.    Patient believes her last Entyvio dose was December 11.  Associated symptoms include low-grade fever around 99.5.    Patient denies new medications or change in diet, rectal pain, heartburn or reflux.  Past GI History: 01/04/17-OV, Tye Savoy, NP: pt has h/o steroid dependesnce but was able to gt off and was maintained on Enyvio, patient has h/o chronic nausea, vomiting and diarrhea which had apparently resulted in admission severla months prior to that appt-imaginging  suggested SBO, but follow up showed ileus-normal CT enterography (with contrast through NT tube as she couldn't tolerate it), 5-10 BM/day is baseline, Vomtiing 3 x /wk (after stopping sugar and Pizza), maintained on Reglan, continued on Pancreatic enzymes- due for surveillance colo Marcj 2019 for h/o adenomatous polyps 2016-Colonoscopy: bx c/w focal active iletitis and focal active colitis  Past Medical History:  Diagnosis Date  . Arthritis   . Chronic diarrhea   . Chronic disease anemia   . Crohn's disease ([place])   . Depression   . Family history of malignant neoplasm of gastrointestinal tract   . GERD (gastroesophageal reflux disease)   . H/O hiatal hernia   . History of adenomatous polyp of colon   . History of gastritis    AND HX ILEITIS  . History of kidney stones   . History of small bowel obstruction    SECONDARY CROHN'S STRICTURE  S/P COLECTOMY  . Hyperlipidemia   . Hypertension   . Hypopotassemia   . Internal hemorrhoids   . Kidney stones   . Pancreatic insufficiency   . PONV (postoperative nausea and vomiting)   . Psoriasis    SKIN  . Right ureteral stone   . Rotavirus infection 05/31/2013  . S/P dilatation of esophageal stricture   . Seasonal asthma   . Urgency of urination   . Vitamin B12 deficiency     Past Surgical History:  Procedure Laterality Date  . ANAL FISSURE REPAIR  1990's  . CARPAL TUNNEL RELEASE Right 2000  . CHOLECYSTECTOMY  2002  . CYSTOSCOPY WITH RETROGRADE PYELOGRAM, URETEROSCOPY AND STENT PLACEMENT Left 03/08/2013   Procedure: CYSTOSCOPY WITH RETROGRADE PYELOGRAM, URETEROSCOPY AND STENT PLACEMENT stone basketing;  Surgeon: Alexis Frock, MD;  Location: [facility];  Service: Urology;  Laterality: Left;  . CYSTOSCOPY WITH RETROGRADE PYELOGRAM, URETEROSCOPY AND STENT PLACEMENT Right 04/03/2013   Procedure: CYSTOSCOPY WITH RETROGRADE PYELOGRAM, URETEROSCOPY AND STENT PLACEMENT;  Surgeon: Alexis Frock, MD;  Location: [facility];  Service:  Urology;  Laterality: Right;  . DX LAPAROSCOPY CONVERTED TO OPEN RIGHT COLECCTOMY  09-15-2010   ANASTOMOTIC STRICTURE  . LAPAROSCOPIC ILEOCECECTOMY  10-09-2008   AND APPENDECTOMY (TERMINAL ILEITIS & PARTIAL SBO)  . VAGINAL HYSTERECTOMY  1992    Family History  Problem Relation Age of Onset  . Colon cancer Mother   . Colon polyps Mother   . Colon polyps Father   . Lung cancer Father   . Breast cancer Paternal Grandmother   . Colon polyps Brother   . Thyroid disease Neg Hx   . Stomach cancer Neg Hx   . Pancreatic cancer Neg Hx     Social History   Tobacco Use  . Smoking status: Former Smoker    Packs/day: 1.00    Years: 10.00    Pack years: 10.00    Types: Cigarettes    Last attempt to quit: 03/07/1984    Years since quitting: 33.0  . Smokeless tobacco: Never Used  Substance Use Topics  . Alcohol use: No    Alcohol/week: 0.0 oz  . Drug use: No    Prior to Admission medications   Medication Sig Start Date End Date Taking? Authorizing Provider  albuterol (PROAIR HFA) 108 (90 BASE) MCG/ACT inhaler Inhale 2 puffs into the lungs every 4 (four) hours as needed for wheezing or shortness of breath.    Yes [provider]  budesonide-formoterol (SYMBICORT) 80-4.5 MCG/ACT inhaler Inhale 2 puffs into the lungs daily as needed (SOB, wheezing).    Yes [provider]  cyanocobalamin (,VITAMIN B-12,) 1000 MCG/ML injection Inject 1 mL (1,000 mcg total) into the skin every 30 (thirty) days. 01/04/17  Yes Willia Craze, NP  econazole nitrate 1 % cream APPLY  CREAM TOPICALLY ONCE DAILY 10/25/16  Yes Nandigam, Venia Minks, MD  gabapentin (NEURONTIN) 400 MG capsule Take 1 capsule by mouth at lunch, 2 at dinner and 3 at bedtime 08/27/16  Yes [provider]  hydrocortisone (PROCTOCORT) 1 % CREA Apply 1 application topically daily as needed (pain).    Yes [provider]  lipase/protease/amylase (CREON) 36000 UNITS CPEP capsule 2 capsules (72,000) by mouth with  meals and 1 (36,000) with snacks 11/08/16  Yes Nandigam, Venia Minks, MD  metoCLOPramide (REGLAN) 5 MG tablet Take 1 tablet (5 mg total) by mouth 4 (four) times daily. Take 1 tablet before each meal and 1 tablet at bedtime for 2 weeks. 01/04/17  Yes Willia Craze, NP  Needles & Syringes MISC 1 Syringe by Does not apply route every 30 (thirty) days. 01/02/15  Yes Nandigam, Venia Minks, MD  omeprazole (PRILOSEC) 40 MG capsule Take 1 capsule (40 mg total) by mouth daily. 06/30/16  Yes Willia Craze, NP  Oxycodone HCl 10 MG TABS Take 10 mg by mouth See admin instructions. 1 tablet up to five times a day as needed for severe pain.   Yes [provider]  promethazine (  PHENERGAN) 25 MG suppository Place 1 suppository (25 mg total) rectally every 6 (six) hours as needed for nausea or vomiting. 09/14/15  Yes Nandigam, Venia Minks, MD  promethazine (PHENERGAN) 25 MG tablet TAKE 1 TABLET BY MOUTH EVERY 6 HOURS AS NEEDED FOR NAUSEA AND VOMITING 01/10/17  Yes Nandigam, Venia Minks, MD  propranolol ER (INDERAL LA) 80 MG 24 hr capsule Take 1 capsule by mouth daily. 08/22/16  Yes [provider]  tiZANidine (ZANAFLEX) 4 MG tablet Take 4 mg by mouth every 8 (eight) hours as needed for muscle spasms.    Yes [provider]  topiramate (TOPAMAX) 50 MG tablet TAKE ONE TABLET BY MOUTH AT BEDTIME 08/10/16  Yes Jaffe, Adam R, DO  Vedolizumab (ENTYVIO IV) Inject into the vein every 8 (eight) weeks. Receives at [facility].   Yes [provider]  zolmitriptan (ZOMIG) 5 MG nasal solution 1 spray in one nostril.  May repeat 1 spray once after 2 hours if needed. Not to exceed 2 sprays in 24 hours Patient taking differently: Place 1 spray into the nose every 2 (two) hours as needed for migraine. 1 spray in one nostril.  May repeat 1 spray once after 2 hours if needed. Not to exceed 2 sprays in 24 hours 04/11/16  Yes Pieter Partridge, DO    Current Facility-Administered Medications  Medication Dose Route  Frequency Provider Last Rate Last Dose  . metoCLOPramide (REGLAN) injection 10 mg  10 mg Intravenous Once Gareth Morgan, MD      . potassium chloride 10 mEq in 100 mL IVPB  10 mEq Intravenous Q1 Hr x 3 Gareth Morgan, MD       Current Outpatient Medications  Medication Sig Dispense Refill  . albuterol (PROAIR HFA) 108 (90 BASE) MCG/ACT inhaler Inhale 2 puffs into the lungs every 4 (four) hours as needed for wheezing or shortness of breath.     . budesonide-formoterol (SYMBICORT) 80-4.5 MCG/ACT inhaler Inhale 2 puffs into the lungs daily as needed (SOB, wheezing).     . cyanocobalamin (,VITAMIN B-12,) 1000 MCG/ML injection Inject 1 mL (1,000 mcg total) into the skin every 30 (thirty) days. 1 mL 5  . econazole nitrate 1 % cream APPLY  CREAM TOPICALLY ONCE DAILY 30 g 0  . gabapentin (NEURONTIN) 400 MG capsule Take 1 capsule by mouth at lunch, 2 at dinner and 3 at bedtime    . hydrocortisone (PROCTOCORT) 1 % CREA Apply 1 application topically daily as needed (pain).     Marland Kitchen lipase/protease/amylase (CREON) 36000 UNITS CPEP capsule 2 capsules (72,000) by mouth with meals and 1 (36,000) with snacks 210 capsule 6  . metoCLOPramide (REGLAN) 5 MG tablet Take 1 tablet (5 mg total) by mouth 4 (four) times daily. Take 1 tablet before each meal and 1 tablet at bedtime for 2 weeks. 120 tablet 3  . Needles & Syringes MISC 1 Syringe by Does not apply route every 30 (thirty) days. 12 each 0  . omeprazole (PRILOSEC) 40 MG capsule Take 1 capsule (40 mg total) by mouth daily. 30 capsule 12  . Oxycodone HCl 10 MG TABS Take 10 mg by mouth See admin instructions. 1 tablet up to five times a day as needed for severe pain.    . promethazine (PHENERGAN) 25 MG suppository Place 1 suppository (25 mg total) rectally every 6 (six) hours as needed for nausea or vomiting. 12 each 2  . promethazine (PHENERGAN) 25 MG tablet TAKE 1 TABLET BY MOUTH EVERY 6 HOURS  AS NEEDED FOR NAUSEA AND VOMITING 60 tablet 2  . propranolol ER  (INDERAL LA) 80 MG 24 hr capsule Take 1 capsule by mouth daily.    Marland Kitchen tiZANidine (ZANAFLEX) 4 MG tablet Take 4 mg by mouth every 8 (eight) hours as needed for muscle spasms.     Marland Kitchen topiramate (TOPAMAX) 50 MG tablet TAKE ONE TABLET BY MOUTH AT BEDTIME 30 tablet 3  . Vedolizumab (ENTYVIO IV) Inject into the vein every 8 (eight) weeks. Receives at [facility].    . zolmitriptan (ZOMIG) 5 MG nasal solution 1 spray in one nostril.  May repeat 1 spray once after 2 hours if needed. Not to exceed 2 sprays in 24 hours (Patient taking differently: Place 1 spray into the nose every 2 (two) hours as needed for migraine. 1 spray in one nostril.  May repeat 1 spray once after 2 hours if needed. Not to exceed 2 sprays in 24 hours) 6 Units 0    Allergies as of 03/01/2017 - Review Complete 03/01/2017  Allergen Reaction Noted  . Codeine Hives and Nausea And Vomiting 08/31/2007  . Humira [adalimumab] Rash 12/24/2013     Review of Systems:    Constitutional: See HPI Skin: No rash Cardiovascular: No chest pain  Respiratory: No SOB  Gastrointestinal: See HPI and otherwise negative Genitourinary: No dysuria  Neurological: No headache Musculoskeletal: No new muscle or joint pain Hematologic: No bruising Psychiatric: No history of depression or anxiety   Physical Exam:  Vital signs in last 24 hours: Temp:  [98.8 F (37.1 C)] 98.8 F (37.1 C) (12/26 1007) Pulse Rate:  [85-98] 97 (12/26 1330) Resp:  [13-20] 20 (12/26 1330) BP: (131-144)/(73-90) 131/73 (12/26 1330) SpO2:  [82 %-99 %] 98 % (12/26 1330) Weight:  [172 lb (78 kg)] 172 lb (78 kg) (12/26 1006)   General:   Ill-appearing Caucasian female  in NAD, Well developed, Well nourished, alert and cooperative Head:  Normocephalic and atraumatic. Eyes:   PEERL, EOMI. No icterus. Conjunctiva pink. Ears:  Normal auditory acuity. Neck:  Supple Throat: Oral cavity and pharynx without inflammation, swelling or lesion.  Lungs: Respirations even and  unlabored. Lungs clear to auscultation bilaterally.   No wheezes, crackles, or rhonchi.  Heart: Normal S1, S2. No MRG. Regular rate and rhythm. No peripheral edema, cyanosis or pallor.  Abdomen:  Soft, nondistended, mild b/l lower abdominal ttp, No rebound or guarding. Normal bowel sounds. No appreciable masses or hepatomegaly. Rectal:  Not performed.  Msk:  Symmetrical without gross deformities.  Extremities:  Without edema, no deformity or joint abnormality.  Neurologic:  Alert and  oriented x4;  grossly normal neurologically.  Skin:   Dry and intact without significant lesions or rashes. Psychiatric: Demonstrates good judgement and reason without abnormal affect or behaviors.   LAB RESULTS: Recent Labs    03/01/17 1036  WBC 8.3  HGB 13.3  HCT 40.3  PLT 361   BMET Recent Labs    03/01/17 1036  NA 137  K 2.9*  CL 99*  CO2 26  GLUCOSE 116*  BUN 11  CREATININE 0.73  CALCIUM 9.8   LFT Recent Labs    03/01/17 1036  PROT 9.7*  ALBUMIN 3.7  AST 43*  ALT 39  ALKPHOS 105  BILITOT 0.6   PREVIOUS ENDOSCOPIES:            See HPI   Impression / Plan:   Impression: 1. Nausea and Vomiting: Acute on chronic for the patient, now unable to  hold water or other foods down at all over the past 5 weeks with a 7 pound weight loss per our records (179 on 01/04/17 and now 172), known history of gastroparesis and use of Reglan; question gastroparesis most likely versus bowel obstruction versus other 2. Lower abdominal pain: Consider relation IBS versus Crohn's as below 3. H/o Crohn's Disease: History of steroid dependence, switched to [facility], her last dose 02/14/17, chronic diarrhea has continued with 7-10 bowel movements per day, now with bilateral lower abdominal pain; consider possible Crohn's flare versus other  Plan: 1. Ordered CRP 2. Ordered Reglan 5m q6 hours 3. Continue supportive measures 4. Will await CT results 5. Please await any further recommendations from Dr.  DLoletha Carrowlater today  Thank you for your kind consultation, we will continue to follow.  JLavone NianLemmon  03/01/2017, 2:20 PM Pager #: [phone]  I have reviewed the entire case in detail with the above APP and discussed the plan in detail.  Therefore, I agree with the diagnoses recorded above. In addition,  I have personally interviewed and examined the patient and have personally reviewed any abdominal/pelvic CT scan images.  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 She also carries a diagnosis of gastroparesis, I do not know if it is felt to be opioid-induced. She also apparently has chronic pancreatitis. Diarrhea is probably from history of ileal resection, perhaps an element of IBS, malabsorption from chronic pancreatitis and possibly small intestinal bacterial overgrowth. Her biggest problem right now appears to be worsening of her chronic nausea and vomiting. There is no obstruction based on exam or imaging. She has no new medicines are increased dose of opioids that would seem to account for. This seems to be worsening of her gastroparesis that is not responding to Reglan 5 mg dosing. I do not think she has active colitis based on the CT scan. We have decided to increase Reglan to 10 mg and give it every 6 hours IV, as needed dosing of ondansetron can be used as well. I will do a stool study rule out C. Difficile Possible empiric treatment of SIBO with rifaximin can be entertained as an outpatient. Trial of Bentyl 10 mg 3 times a day I have discontinued her ciprofloxacin and metronidazole as I see no indication for limit present. Will follow   HNelida MeuseIII Pager [phone] Mon-Fri 8a-5p [phone] 5p, weekends, holidays

## 2017-03-01 NOTE — ED Triage Notes (Signed)
Patient presents with c/o of chronic nausea and diarrhea due to Chrohns. Patient states over the past 3-4 weeks she has loss almost 20lb in the past few months due to this. States she feels as though she is dehydrated with some weakness due to not being able to eat much. She reports not being able to get in to be seen with her GI provider. Denies any fevers. She is in wheelchair due to weakness.

## 2017-03-01 NOTE — H&P (Signed)
History and Physical    [name] BDZ:[id number] DOB: Sep 06, 1960 DOA: 03/01/2017  PCP: Rochel Brome, MD  Patient coming from:  home  Chief Complaint:  Diarrhea, abdominal pain, wt loss  HPI: MARINELL [name] is a 56 y.o. female with medical history significant of crohns disease, sbo, s/p several resections comes in with 5 weeks of diarrhea and bilateral abdominal pain.  She reports nausea starting on the last day or so.  She denies any fevers.  No rectal pain.  She has had over 20 lb wt loss in the last month.  She has tried to get in to see her GI doctor but there has been no appointments available so she has not seen a doctor about this yet.  Her last flare was in march 2018.  She is on entyvio and she thought this was helping her.  Pt referred for admission for her symptoms.  Gi called and will be seeing the pt also.    Review of Systems: As per HPI otherwise 10 point review of systems negative.   Past Medical History:  Diagnosis Date  . Arthritis   . Chronic diarrhea   . Chronic disease anemia   . Crohn's disease ([place])   . Depression   . Family history of malignant neoplasm of gastrointestinal tract   . GERD (gastroesophageal reflux disease)   . H/O hiatal hernia   . History of adenomatous polyp of colon   . History of gastritis    AND HX ILEITIS  . History of kidney stones   . History of small bowel obstruction    SECONDARY CROHN'S STRICTURE  S/P COLECTOMY  . Hyperlipidemia   . Hypertension   . Hypopotassemia   . Internal hemorrhoids   . Kidney stones   . Pancreatic insufficiency   . PONV (postoperative nausea and vomiting)   . Psoriasis    SKIN  . Right ureteral stone   . Rotavirus infection 05/31/2013  . S/P dilatation of esophageal stricture   . Seasonal asthma   . Urgency of urination   . Vitamin B12 deficiency     Past Surgical History:  Procedure Laterality Date  . ANAL FISSURE REPAIR  1990's  . CARPAL TUNNEL RELEASE Right 2000  . CHOLECYSTECTOMY   2002  . CYSTOSCOPY WITH RETROGRADE PYELOGRAM, URETEROSCOPY AND STENT PLACEMENT Left 03/08/2013   Procedure: CYSTOSCOPY WITH RETROGRADE PYELOGRAM, URETEROSCOPY AND STENT PLACEMENT stone basketing;  Surgeon: Alexis Frock, MD;  Location: [facility];  Service: Urology;  Laterality: Left;  . CYSTOSCOPY WITH RETROGRADE PYELOGRAM, URETEROSCOPY AND STENT PLACEMENT Right 04/03/2013   Procedure: CYSTOSCOPY WITH RETROGRADE PYELOGRAM, URETEROSCOPY AND STENT PLACEMENT;  Surgeon: Alexis Frock, MD;  Location: [facility];  Service: Urology;  Laterality: Right;  . DX LAPAROSCOPY CONVERTED TO OPEN RIGHT COLECCTOMY  09-15-2010   ANASTOMOTIC STRICTURE  . LAPAROSCOPIC ILEOCECECTOMY  10-09-2008   AND APPENDECTOMY (TERMINAL ILEITIS & PARTIAL SBO)  . VAGINAL HYSTERECTOMY  1992     reports that she quit smoking about 33 years ago. Her smoking use included cigarettes. She has a 10.00 pack-year smoking history. she has never used smokeless tobacco. She reports that she does not drink alcohol or use drugs.  Allergies  Allergen Reactions  . Codeine Hives and Nausea And Vomiting  . Humira [Adalimumab] Rash    Family History  Problem Relation Age of Onset  . Colon cancer Mother   . Colon polyps Mother   . Colon polyps Father   . Lung cancer Father   .  Breast cancer Paternal Grandmother   . Colon polyps Brother   . Thyroid disease Neg Hx   . Stomach cancer Neg Hx   . Pancreatic cancer Neg Hx     Prior to Admission medications   Medication Sig Start Date End Date Taking? Authorizing Provider  albuterol (PROAIR HFA) 108 (90 BASE) MCG/ACT inhaler Inhale 2 puffs into the lungs every 4 (four) hours as needed for wheezing or shortness of breath.    Yes [provider]  budesonide-formoterol (SYMBICORT) 80-4.5 MCG/ACT inhaler Inhale 2 puffs into the lungs daily as needed (SOB, wheezing).    Yes [provider]  cyanocobalamin (,VITAMIN B-12,) 1000 MCG/ML injection Inject 1 mL (1,000 mcg  total) into the skin every 30 (thirty) days. 01/04/17  Yes Willia Craze, NP  econazole nitrate 1 % cream APPLY  CREAM TOPICALLY ONCE DAILY 10/25/16  Yes Nandigam, Venia Minks, MD  gabapentin (NEURONTIN) 400 MG capsule Take 1 capsule by mouth at lunch, 2 at dinner and 3 at bedtime 08/27/16  Yes [provider]  hydrocortisone (PROCTOCORT) 1 % CREA Apply 1 application topically daily as needed (pain).    Yes [provider]  lipase/protease/amylase (CREON) 36000 UNITS CPEP capsule 2 capsules (72,000) by mouth with meals and 1 (36,000) with snacks 11/08/16  Yes Nandigam, Venia Minks, MD  metoCLOPramide (REGLAN) 5 MG tablet Take 1 tablet (5 mg total) by mouth 4 (four) times daily. Take 1 tablet before each meal and 1 tablet at bedtime for 2 weeks. 01/04/17  Yes Willia Craze, NP  Needles & Syringes MISC 1 Syringe by Does not apply route every 30 (thirty) days. 01/02/15  Yes Nandigam, Venia Minks, MD  omeprazole (PRILOSEC) 40 MG capsule Take 1 capsule (40 mg total) by mouth daily. 06/30/16  Yes Willia Craze, NP  Oxycodone HCl 10 MG TABS Take 10 mg by mouth See admin instructions. 1 tablet up to five times a day as needed for severe pain.   Yes [provider]  promethazine (PHENERGAN) 25 MG suppository Place 1 suppository (25 mg total) rectally every 6 (six) hours as needed for nausea or vomiting. 09/14/15  Yes Nandigam, Venia Minks, MD  promethazine (PHENERGAN) 25 MG tablet TAKE 1 TABLET BY MOUTH EVERY 6 HOURS AS NEEDED FOR NAUSEA AND VOMITING 01/10/17  Yes Nandigam, Venia Minks, MD  propranolol ER (INDERAL LA) 80 MG 24 hr capsule Take 1 capsule by mouth daily. 08/22/16  Yes [provider]  tiZANidine (ZANAFLEX) 4 MG tablet Take 4 mg by mouth every 8 (eight) hours as needed for muscle spasms.    Yes [provider]  topiramate (TOPAMAX) 50 MG tablet TAKE ONE TABLET BY MOUTH AT BEDTIME 08/10/16  Yes Jaffe, Adam R, DO  Vedolizumab (ENTYVIO IV) Inject into the vein  every 8 (eight) weeks. Receives at [facility].   Yes [provider]  zolmitriptan (ZOMIG) 5 MG nasal solution 1 spray in one nostril.  May repeat 1 spray once after 2 hours if needed. Not to exceed 2 sprays in 24 hours Patient taking differently: Place 1 spray into the nose every 2 (two) hours as needed for migraine. 1 spray in one nostril.  May repeat 1 spray once after 2 hours if needed. Not to exceed 2 sprays in 24 hours 04/11/16  Yes Pieter Partridge, DO    Physical Exam: Vitals:   03/01/17 1214 03/01/17 1230 03/01/17 1330 03/01/17 1430  BP: (!) 144/84 140/90 131/73 (!) 151/74  Pulse:  98 91 97 96  Resp: 16 13 20 18   Temp:      TempSrc:      SpO2: 98% (!) 82% 98% 99%  Weight:      Height:          Constitutional: NAD, calm, comfortable Vitals:   03/01/17 1214 03/01/17 1230 03/01/17 1330 03/01/17 1430  BP: (!) 144/84 140/90 131/73 (!) 151/74  Pulse: 98 91 97 96  Resp: 16 13 20 18   Temp:      TempSrc:      SpO2: 98% (!) 82% 98% 99%  Weight:      Height:       Eyes: PERRL, lids and conjunctivae normal ENMT: Mucous membranes are moist. Posterior pharynx clear of any exudate or lesions.Normal dentition.  Neck: normal, supple, no masses, no thyromegaly Respiratory: clear to auscultation bilaterally, no wheezing, no crackles. Normal respiratory effort. No accessory muscle use.  Cardiovascular: Regular rate and rhythm, no murmurs / rubs / gallops. No extremity edema. 2+ pedal pulses. No carotid bruits.  Abdomen: no tenderness, no masses palpated. No hepatosplenomegaly. Bowel sounds positive.  Musculoskeletal: no clubbing / cyanosis. No joint deformity upper and lower extremities. Good ROM, no contractures. Normal muscle tone.  Skin: no rashes, lesions, ulcers. No induration Neurologic: CN 2-12 grossly intact. Sensation intact, DTR normal. Strength 5/5 in all 4.  Psychiatric: Normal judgment and insight. Alert and oriented x 3. Normal mood.    Labs on Admission: I have  personally reviewed following labs and imaging studies  CBC: Recent Labs  Lab 03/01/17 1036  WBC 8.3  HGB 13.3  HCT 40.3  MCV 86.1  PLT 038   Basic Metabolic Panel: Recent Labs  Lab 03/01/17 1036  NA 137  K 2.9*  CL 99*  CO2 26  GLUCOSE 116*  BUN 11  CREATININE 0.73  CALCIUM 9.8  MG 1.6*   GFR: Estimated Creatinine Clearance: 83.5 mL/min (by C-G formula based on SCr of 0.73 mg/dL). Liver Function Tests: Recent Labs  Lab 03/01/17 1036  AST 43*  ALT 39  ALKPHOS 105  BILITOT 0.6  PROT 9.7*  ALBUMIN 3.7   Recent Labs  Lab 03/01/17 1036  LIPASE 36   No results for input(s): AMMONIA in the last 168 hours. Coagulation Profile: No results for input(s): INR, PROTIME in the last 168 hours. Cardiac Enzymes: No results for input(s): CKTOTAL, CKMB, CKMBINDEX, TROPONINI in the last 168 hours. BNP (last 3 results) No results for input(s): PROBNP in the last 8760 hours. HbA1C: No results for input(s): HGBA1C in the last 72 hours. CBG: No results for input(s): GLUCAP in the last 168 hours. Lipid Profile: No results for input(s): CHOL, HDL, LDLCALC, TRIG, CHOLHDL, LDLDIRECT in the last 72 hours. Thyroid Function Tests: No results for input(s): TSH, T4TOTAL, FREET4, T3FREE, THYROIDAB in the last 72 hours. Anemia Panel: No results for input(s): VITAMINB12, FOLATE, FERRITIN, TIBC, IRON, RETICCTPCT in the last 72 hours. Urine analysis:    Component Value Date/Time   COLORURINE YELLOW 03/01/2017 1121   APPEARANCEUR TURBID (A) 03/01/2017 1121   LABSPEC 1.026 03/01/2017 1121   PHURINE 5.0 03/01/2017 1121   GLUCOSEU NEGATIVE 03/01/2017 1121   HGBUR NEGATIVE 03/01/2017 1121   BILIRUBINUR NEGATIVE 03/01/2017 1121   KETONESUR 5 (A) 03/01/2017 1121   PROTEINUR 30 (A) 03/01/2017 1121   UROBILINOGEN 0.2 03/29/2013 0919   NITRITE NEGATIVE 03/01/2017 1121   LEUKOCYTESUR NEGATIVE 03/01/2017 1121   Sepsis Labs:  !!!!!!!!!!!!!!!!!!!!!!!!!!!!!!!!!!!!!!!!!!!! @LABRCNTIP (procalcitonin:4,lacticidven:4) )No results found for this  or any previous visit (from the past 240 hour(s)).   Radiological Exams on Admission: No results found.  Old chart reviewed Case discussed with edp  case discussed with gi team  Assessment/Plan 56 yo female with acute crohns flare for over a month  Principal Problem:   Crohn's colitis ([place])- place on solumedrol, cipro iv and flagyl iv.  Gi consulting follow up on recs.  Ck imaging to r/o complications, component of obstruction.  Wbc nml and no fever reassuring.  Further recs per GI team.  abd exam benign.  Active Problems:   Crohn's disease ([place])- as above   Asthma- noted, stable   Nausea & vomiting - as above   Abdominal pain- due to crohns   Hypokalemia- replete iv kcl 3mq.  Mag also low give mag sulfate 2gm iv now.  Repeat levels in am   Weight loss- due to above    DVT prophylaxis:  scds Code Status:  full Family Communication:  husband Disposition Plan:  Per day team Consults called:  GI Admission status:  observation    Janie Capp A MD [facility]  If 7PM-7AM, please contact night-coverage [email or web address] Password [email or web address] 03/01/2017, 3:18 PM

## 2017-03-01 NOTE — ED Provider Notes (Signed)
[facility] Provider Note   CSN: [id number] Arrival date & time: 03/01/17  8099     History   Chief Complaint Chief Complaint  Patient presents with  . Nausea  . Diarrhea  . Abdominal Pain    HPI [name] is a 56 y.o. female.  HPI  56yo female with history of Crohn's disease presents with concern for nausea, vomiting and diarrhea. Reports it has been going on for 5 weeks. Reports up to 7 episodes per day of diarrhea. No blood.  Reports nausea and vomiting. Has not been able to keep much down during the last month and has had 20lb weight loss. zofran does not help with nausea. Tried phenergan suppositories and oral but unable to keep them down and loses suppositories with diarrhea.  No fevers, no urinary symptoms. Has had generalized weakness. Lower abdominal cramping with diarrhea, feels like crohn's flare. Sees Dr. Juliann Mule of GI, has not seen recently, not on medications for flare.  Past Medical History:  Diagnosis Date  . Arthritis   . Chronic diarrhea   . Chronic disease anemia   . Crohn's disease ([place])   . Depression   . Family history of malignant neoplasm of gastrointestinal tract   . GERD (gastroesophageal reflux disease)   . H/O hiatal hernia   . History of adenomatous polyp of colon   . History of gastritis    AND HX ILEITIS  . History of kidney stones   . History of small bowel obstruction    SECONDARY CROHN'S STRICTURE  S/P COLECTOMY  . Hyperlipidemia   . Hypertension   . Hypopotassemia   . Internal hemorrhoids   . Kidney stones   . Pancreatic insufficiency   . PONV (postoperative nausea and vomiting)   . Psoriasis    SKIN  . Right ureteral stone   . Rotavirus infection 05/31/2013  . S/P dilatation of esophageal stricture   . Seasonal asthma   . Urgency of urination   . Vitamin B12 deficiency     Patient Active Problem List   Diagnosis Date Noted  . Crohn's colitis ([place]) 03/01/2017  . Weight  loss 03/01/2017  . Crohn's disease (regional enteritis) ([place]) 07/19/2016  . Ileus ([place])   . Small bowel obstruction ([place])   . SBO (small bowel obstruction) ([place])   . Hyperglycemia 05/25/2016  . Encounter for imaging study to confirm nasogastric (NG) tube placement   . N&V (nausea and vomiting) 05/24/2016  . Pancreatic insufficiency   . Viral gastroenteritis 02/20/2016  . Hypokalemia 02/20/2016  . Hypomagnesemia 02/20/2016  . Colitis   . Low TSH level 09/26/2013  . Rotavirus infection 05/31/2013  . Protein-calorie malnutrition, severe ([place]) 05/30/2013  . Crohn's disease involving terminal ileum ([place]) 05/28/2013  . Skin lesion 12/14/2012  . Weight increase 08/31/2010  . Wears dentures 08/31/2010  . Nausea & vomiting  08/31/2010  . IBS (irritable bowel syndrome) 08/31/2010  . Hiatal hernia 08/31/2010  . Reflux 08/31/2010  . Abdominal pain 08/31/2010  . Rectal bleeding 08/31/2010  . Hemorrhoids 08/31/2010  . Sinus problem/runny nose 08/31/2010  . Wears glasses 08/31/2010  . Asthma 08/30/2010  . COUGH 04/02/2010  . Crohn's disease ([place]) 02/03/2009  . DIARRHEA 02/03/2009  . VITAMIN B12 DEFICIENCY 12/17/2008  . SBO 09/12/2008  . PSORIASIS 11/07/2007  . DEPRESSION 08/31/2007  . HEARING LOSS 08/31/2007  . GERD 08/31/2007  . HIATAL HERNIA 08/31/2007  . COLONIC POLYPS, ADENOMATOUS, HX OF 08/31/2007  Past Surgical History:  Procedure Laterality Date  . ANAL FISSURE REPAIR  1990's  . CARPAL TUNNEL RELEASE Right 2000  . CHOLECYSTECTOMY  2002  . CYSTOSCOPY WITH RETROGRADE PYELOGRAM, URETEROSCOPY AND STENT PLACEMENT Left 03/08/2013   Procedure: CYSTOSCOPY WITH RETROGRADE PYELOGRAM, URETEROSCOPY AND STENT PLACEMENT stone basketing;  Surgeon: Alexis Frock, MD;  Location: [facility];  Service: Urology;  Laterality: Left;  . CYSTOSCOPY WITH RETROGRADE PYELOGRAM, URETEROSCOPY AND STENT PLACEMENT Right 04/03/2013   Procedure: CYSTOSCOPY WITH RETROGRADE PYELOGRAM, URETEROSCOPY AND STENT  PLACEMENT;  Surgeon: Alexis Frock, MD;  Location: [facility];  Service: Urology;  Laterality: Right;  . DX LAPAROSCOPY CONVERTED TO OPEN RIGHT COLECCTOMY  09-15-2010   ANASTOMOTIC STRICTURE  . LAPAROSCOPIC ILEOCECECTOMY  10-09-2008   AND APPENDECTOMY (TERMINAL ILEITIS & PARTIAL SBO)  . VAGINAL HYSTERECTOMY  1992    OB History    No data available       Home Medications    Prior to Admission medications   Medication Sig Start Date End Date Taking? Authorizing Provider  albuterol (PROAIR HFA) 108 (90 BASE) MCG/ACT inhaler Inhale 2 puffs into the lungs every 4 (four) hours as needed for wheezing or shortness of breath.    Yes [provider]  budesonide-formoterol (SYMBICORT) 80-4.5 MCG/ACT inhaler Inhale 2 puffs into the lungs daily as needed (SOB, wheezing).    Yes [provider]  cyanocobalamin (,VITAMIN B-12,) 1000 MCG/ML injection Inject 1 mL (1,000 mcg total) into the skin every 30 (thirty) days. 01/04/17  Yes Willia Craze, NP  econazole nitrate 1 % cream APPLY  CREAM TOPICALLY ONCE DAILY 10/25/16  Yes Nandigam, Venia Minks, MD  gabapentin (NEURONTIN) 400 MG capsule Take 1 capsule by mouth at lunch, 2 at dinner and 3 at bedtime 08/27/16  Yes [provider]  hydrocortisone (PROCTOCORT) 1 % CREA Apply 1 application topically daily as needed (pain).    Yes [provider]  lipase/protease/amylase (CREON) 36000 UNITS CPEP capsule 2 capsules (72,000) by mouth with meals and 1 (36,000) with snacks 11/08/16  Yes Nandigam, Venia Minks, MD  metoCLOPramide (REGLAN) 5 MG tablet Take 1 tablet (5 mg total) by mouth 4 (four) times daily. Take 1 tablet before each meal and 1 tablet at bedtime for 2 weeks. 01/04/17  Yes Willia Craze, NP  Needles & Syringes MISC 1 Syringe by Does not apply route every 30 (thirty) days. 01/02/15  Yes Nandigam, Venia Minks, MD  omeprazole (PRILOSEC) 40 MG capsule Take 1 capsule (40 mg total) by mouth daily. 06/30/16   Yes Willia Craze, NP  Oxycodone HCl 10 MG TABS Take 10 mg by mouth See admin instructions. 1 tablet up to five times a day as needed for severe pain.   Yes [provider]  promethazine (PHENERGAN) 25 MG suppository Place 1 suppository (25 mg total) rectally every 6 (six) hours as needed for nausea or vomiting. 09/14/15  Yes Nandigam, Venia Minks, MD  promethazine (PHENERGAN) 25 MG tablet TAKE 1 TABLET BY MOUTH EVERY 6 HOURS AS NEEDED FOR NAUSEA AND VOMITING 01/10/17  Yes Nandigam, Venia Minks, MD  propranolol ER (INDERAL LA) 80 MG 24 hr capsule Take 1 capsule by mouth daily. 08/22/16  Yes [provider]  tiZANidine (ZANAFLEX) 4 MG tablet Take 4 mg by mouth every 8 (eight) hours as needed for muscle spasms.    Yes [provider]  topiramate (TOPAMAX) 50 MG tablet TAKE ONE TABLET BY MOUTH AT BEDTIME 08/10/16  Yes Pieter Partridge,  DO  Vedolizumab (ENTYVIO IV) Inject into the vein every 8 (eight) weeks. Receives at [facility].   Yes [provider]  zolmitriptan (ZOMIG) 5 MG nasal solution 1 spray in one nostril.  May repeat 1 spray once after 2 hours if needed. Not to exceed 2 sprays in 24 hours Patient taking differently: Place 1 spray into the nose every 2 (two) hours as needed for migraine. 1 spray in one nostril.  May repeat 1 spray once after 2 hours if needed. Not to exceed 2 sprays in 24 hours 04/11/16  Yes Pieter Partridge, DO    Family History Family History  Problem Relation Age of Onset  . Colon cancer Mother   . Colon polyps Mother   . Colon polyps Father   . Lung cancer Father   . Breast cancer Paternal Grandmother   . Colon polyps Brother   . Thyroid disease Neg Hx   . Stomach cancer Neg Hx   . Pancreatic cancer Neg Hx     Social History Social History   Tobacco Use  . Smoking status: Former Smoker    Packs/day: 1.00    Years: 10.00    Pack years: 10.00    Types: Cigarettes    Last attempt to quit: 03/07/1984    Years since quitting: 33.0    . Smokeless tobacco: Never Used  Substance Use Topics  . Alcohol use: No    Alcohol/week: 0.0 oz  . Drug use: No     Allergies   Codeine and Humira [adalimumab]   Review of Systems Review of Systems  Constitutional: Negative for fever.  HENT: Negative for sore throat.   Eyes: Negative for visual disturbance.  Respiratory: Negative for cough and shortness of breath.   Cardiovascular: Negative for chest pain.  Gastrointestinal: Positive for abdominal pain, diarrhea, nausea and vomiting.  Genitourinary: Negative for difficulty urinating.  Musculoskeletal: Negative for back pain and neck pain.  Skin: Negative for rash.  Neurological: Negative for syncope and headaches.     Physical Exam Updated Vital Signs BP 116/69 (BP Location: Right Arm)   Pulse 81   Temp 98 F (36.7 C) (Oral)   Resp 18   Ht 5' 7"  (1.702 m)   Wt 78 kg (172 lb)   SpO2 96%   BMI 26.94 kg/m   Physical Exam  Constitutional: She is oriented to person, place, and time. She appears well-developed and well-nourished. No distress.  HENT:  Head: Normocephalic and atraumatic.  Dry mucous membranes  Eyes: Conjunctivae and EOM are normal.  Neck: Normal range of motion.  Cardiovascular: Normal rate, regular rhythm, normal heart sounds and intact distal pulses. Exam reveals no gallop and no friction rub.  No murmur heard. Pulmonary/Chest: Effort normal and breath sounds normal. No respiratory distress. She has no wheezes. She has no rales.  Abdominal: Soft. She exhibits no distension. There is no tenderness. There is no guarding.  Musculoskeletal: She exhibits no edema or tenderness.  Neurological: She is alert and oriented to person, place, and time.  Skin: Skin is warm and dry. No rash noted. She is not diaphoretic. No erythema.  Nursing note and vitals reviewed.    ED Treatments / Results  Labs (all labs ordered are listed, but only abnormal results are displayed) Labs Reviewed  COMPREHENSIVE  METABOLIC PANEL - Abnormal; Notable for the following components:      Result Value   Potassium 2.9 (*)    Chloride 99 (*)    Glucose,  Bld 116 (*)    Total Protein 9.7 (*)    AST 43 (*)    All other components within normal limits  URINALYSIS, ROUTINE W REFLEX MICROSCOPIC - Abnormal; Notable for the following components:   APPearance TURBID (*)    Ketones, ur 5 (*)    Protein, ur 30 (*)    Bacteria, UA MANY (*)    Squamous Epithelial / LPF 0-5 (*)    All other components within normal limits  MAGNESIUM - Abnormal; Notable for the following components:   Magnesium 1.6 (*)    All other components within normal limits  C-REACTIVE PROTEIN - Abnormal; Notable for the following components:   CRP 5.1 (*)    All other components within normal limits  C DIFFICILE QUICK SCREEN W PCR REFLEX  LIPASE, BLOOD  CBC  BASIC METABOLIC PANEL  CBC  MAGNESIUM    EKG  EKG Interpretation None       Radiology Ct Abdomen Pelvis W Contrast  Result Date: 03/01/2017 CLINICAL DATA:  History of Crohn's disease with fever vomiting and diarrhea EXAM: CT ABDOMEN AND PELVIS WITH CONTRAST TECHNIQUE: Multidetector CT imaging of the abdomen and pelvis was performed using the standard protocol following bolus administration of intravenous contrast. CONTRAST:  100 cc Isovue-300 intravenous COMPARISON:  07/19/2016, 05/24/2016, 02/20/2016 FINDINGS: Lower chest: Lung bases demonstrate no acute consolidation or pleural effusion. Normal heart size. Mild coronary calcification Hepatobiliary: No focal hepatic abnormality. Surgical clips at the gallbladder fossa. Stable prominent extrahepatic bile duct. Pancreas: Unremarkable. No pancreatic ductal dilatation or surrounding inflammatory changes. Spleen: Normal in size without focal abnormality. Adrenals/Urinary Tract: Adrenal glands are within normal limits. No hydronephrosis. Stable subcentimeter exophytic hypodensity mid left kidney. Bladder within normal limits  Stomach/Bowel: The stomach is nonenlarged. No dilated small bowel. Re- demonstrated right abdominal post surgical changes consistent with distal small bowel and right colon resection. Fluid present within the remainder of the colon. Prominent fatty hypertrophy consistent with chronic inflammatory process. Mild diffuse mucosal enhancement with mild wall thickening of the descending colon. Vascular/Lymphatic: Mild atherosclerotic calcification. No aneurysmal dilatation. No significantly enlarged lymph nodes Reproductive: Status post hysterectomy. No adnexal masses. Other: Negative for free air or free fluid. Musculoskeletal: Degenerative changes. No acute or suspicious abnormality. IMPRESSION: 1. Stable post- operative changes in the right abdomen without evidence for an obstruction 2. Fluid-filled residual colon with prominent fatty wall thickening consistent with chronic inflammatory process. Mild diffuse mucosal enhancement of colon with mild wall thickening of the proximal descending colon, suspect mild degree of superimposed acute inflammation/colitis. Electronically Signed   By: Donavan Foil M.D.   On: 03/01/2017 15:54    Procedures Procedures (including critical care time)  Medications Ordered in ED Medications  albuterol (PROVENTIL) (2.5 MG/3ML) 0.083% nebulizer solution 2.5 mg (not administered)  promethazine (PHENERGAN) suppository 25 mg (not administered)  oxyCODONE (Oxy IR/ROXICODONE) immediate release tablet 10 mg (10 mg Oral Given 03/01/17 1654)  topiramate (TOPAMAX) tablet 50 mg (50 mg Oral Given 03/01/17 2151)  propranolol ER (INDERAL LA) 24 hr capsule 80 mg (not administered)  hydrocortisone (ANUSOL-HC) 2.5 % rectal cream 1 application (not administered)  cyanocobalamin ((VITAMIN B-12)) injection 1,000 mcg (1,000 mcg Subcutaneous Given 03/01/17 1708)  0.9 % NaCl with KCl 20 mEq/ L  infusion ( Intravenous New Bag/Given 03/01/17 1818)  ondansetron (ZOFRAN) tablet 4 mg ( Oral See  Alternative 03/01/17 1729)    Or  ondansetron (ZOFRAN) injection 4 mg (4 mg Intravenous Given 03/01/17 1729)  methylPREDNISolone sodium succinate (SOLU-MEDROL)  125 mg/2 mL injection 60 mg (60 mg Intravenous Given 03/01/17 1706)  dicyclomine (BENTYL) capsule 10 mg (not administered)  sodium chloride 0.9 % bolus 1,000 mL (0 mLs Intravenous Stopped 03/01/17 1453)  sodium chloride 0.9 % bolus 1,000 mL (0 mLs Intravenous Stopped 03/01/17 1453)  ondansetron (ZOFRAN) injection 4 mg (4 mg Intravenous Given 03/01/17 1125)  potassium chloride 10 mEq in 100 mL IVPB (0 mEq Intravenous Stopped 03/01/17 1702)  metoCLOPramide (REGLAN) injection 10 mg (10 mg Intravenous Given 03/01/17 1448)  iopamidol (ISOVUE-300) 61 % injection 100 mL (100 mLs Intravenous Contrast Given 03/01/17 1538)  0.9 %  sodium chloride infusion ( Intravenous New Bag/Given 03/01/17 1520)  magnesium sulfate IVPB 2 g 50 mL (0 g Intravenous Stopped 03/01/17 1750)     Initial Impression / Assessment and Plan / ED Course  I have reviewed the triage vital signs and the nursing notes.  Pertinent labs & imaging results that were available during my care of the patient were reviewed by me and considered in my medical decision making (see chart for details).      56yo female with history of Crohn's disease presents with concern for nausea, vomiting and diarrhea.  History and exam consistent with Crohn's flare.  Doubt abscess or obstruction. Patient has tried antiemetics as outpatient and failed with continuing emesis and weight loss.  Discussed with Ellouise Newer of GI who will evaluate the patient.  Patient with hypokalemia, given K replacement. Will admit with GI consult and further care. Ct ordered and pending.  Final Clinical Impressions(s) / ED Diagnoses   Final diagnoses:  Intractable vomiting with nausea, unspecified vomiting type  Diarrhea, unspecified type  History of Crohn's disease    ED Discharge Orders    None         Gareth Morgan, MD 03/01/17 2301

## 2017-03-01 NOTE — Progress Notes (Signed)
Patient is transferred from ED at 1630; alert and oriented x4; family member at bedside; vital sign was taken. As ED nurse reported; potassium chloride was given last dose at 1600 in NS bag as patient could not tolerated well. Will monitor patient.

## 2017-03-02 DIAGNOSIS — R112 Nausea with vomiting, unspecified: Secondary | ICD-10-CM | POA: Diagnosis not present

## 2017-03-02 DIAGNOSIS — R197 Diarrhea, unspecified: Secondary | ICD-10-CM

## 2017-03-02 DIAGNOSIS — K5000 Crohn's disease of small intestine without complications: Secondary | ICD-10-CM | POA: Diagnosis not present

## 2017-03-02 DIAGNOSIS — E440 Moderate protein-calorie malnutrition: Secondary | ICD-10-CM

## 2017-03-02 DIAGNOSIS — R634 Abnormal weight loss: Secondary | ICD-10-CM | POA: Diagnosis not present

## 2017-03-02 DIAGNOSIS — R1030 Lower abdominal pain, unspecified: Secondary | ICD-10-CM | POA: Diagnosis not present

## 2017-03-02 DIAGNOSIS — K50119 Crohn's disease of large intestine with unspecified complications: Secondary | ICD-10-CM | POA: Diagnosis not present

## 2017-03-02 LAB — CBC
HCT: 32.4 % — ABNORMAL LOW (ref 36.0–46.0)
HEMOGLOBIN: 10.5 g/dL — AB (ref 12.0–15.0)
MCH: 27.9 pg (ref 26.0–34.0)
MCHC: 32.4 g/dL (ref 30.0–36.0)
MCV: 86.2 fL (ref 78.0–100.0)
Platelets: 258 10*3/uL (ref 150–400)
RBC: 3.76 MIL/uL — AB (ref 3.87–5.11)
RDW: 11.8 % (ref 11.5–15.5)
WBC: 2.9 10*3/uL — ABNORMAL LOW (ref 4.0–10.5)

## 2017-03-02 LAB — BASIC METABOLIC PANEL
ANION GAP: 6 (ref 5–15)
BUN: 6 mg/dL (ref 6–20)
CHLORIDE: 108 mmol/L (ref 101–111)
CO2: 25 mmol/L (ref 22–32)
Calcium: 8.8 mg/dL — ABNORMAL LOW (ref 8.9–10.3)
Creatinine, Ser: 0.61 mg/dL (ref 0.44–1.00)
GFR calc non Af Amer: >60 mL/min (ref >60)
Glucose, Bld: 154 mg/dL — ABNORMAL HIGH (ref 65–99)
POTASSIUM: 3.6 mmol/L (ref 3.5–5.1)
Sodium: 139 mmol/L (ref 135–145)

## 2017-03-02 LAB — MAGNESIUM: Magnesium: 1.8 mg/dL (ref 1.7–2.4)

## 2017-03-02 LAB — CLOSTRIDIUM DIFFICILE BY PCR, REFLEXED: Toxigenic C. Difficile by PCR: NEGATIVE

## 2017-03-02 LAB — C DIFFICILE QUICK SCREEN W PCR REFLEX
C Diff antigen: POSITIVE — AB
C Diff toxin: NEGATIVE

## 2017-03-02 MED ORDER — PROMETHAZINE HCL 25 MG PO TABS: 25.0000 mg | ORAL_TABLET | Freq: Four times a day (QID) | ORAL | Status: DC | PRN

## 2017-03-02 MED ORDER — BOOST / RESOURCE BREEZE PO LIQD CUSTOM
1.0000 | Freq: Three times a day (TID) | ORAL | Status: DC
  Administered 2017-03-02 – 2017-03-03 (×3): 1 via ORAL

## 2017-03-02 NOTE — Progress Notes (Signed)
PROGRESS NOTE    [name]  TDV:[id number] DOB: [birth year] DOA: 03/01/2017 PCP: Rochel Brome, MD  Outpatient Specialists:     Brief Narrative:  Patient is a 56 y.o. female with medical history significant of crohns disease, sbo, s/p several resections comes in with 5 weeks of diarrhea and bilateral abdominal pain.  She reports nausea starting on the last day or so.  She denies any fevers.  No rectal pain.  She has had over 20 lb wt loss in the last month.  She has tried to get in to see her GI doctor but there has been no appointments available so she has not seen a doctor about this yet.  Her last flare was in march 2018.  She is on entyvio and she thought this was helping her.  Pt referred for admission for her symptoms.  GI team is following patient.  03/02/17 - Patient seen. No new complaints. Tolerating some clear liquids. Will be careful with Zofran as the QTc is prolonged. Repeat EKG today revealed QTc of 511. Will continue to monitor, and avoid combining antiemetics, and will avoid any QTc prolonging medication if possible.   Assessment & Plan:   Principal Problem:   Crohn's colitis ([place]) Active Problems:   Crohn's disease ([place])   Asthma   Nausea & vomiting    Abdominal pain   Hypokalemia   Weight loss   DVT prophylaxis: Code Status: Full Family Communication: Disposition Plan: Home eventually   Consultants:   GI  Procedures:   None  Antimicrobials:   None   Subjective: No new complaints. Tolerating clear liquid.   Objective: Vitals:   03/01/17 1631 03/01/17 2132 03/02/17 0536 03/02/17 1318  BP: 124/87 116/69 (!) 145/70 133/79  Pulse: (!) 102 81 71 72  Resp: 18 18 18 18   Temp: 99.6 F (37.6 C) 98 F (36.7 C) 98.3 F (36.8 C) 98.4 F (36.9 C)  TempSrc: Oral Oral Oral Axillary  SpO2: 97% 96% 96% 98%  Weight:      Height:        Intake/Output Summary (Last 24 hours) at 03/02/2017 1554 Last data filed at 03/02/2017 1318 Gross per 24  hour  Intake 2358.75 ml  Output -  Net 2358.75 ml   Filed Weights   03/01/17 1006  Weight: 78 kg (172 lb)    Examination:  General exam: Appears calm and comfortable  Respiratory system: Clear to auscultation. Respiratory effort normal. Cardiovascular system: S1 & S2 heard, RRR. No JVD, murmurs, rubs, gallops or clicks. No pedal edema. Gastrointestinal system: Abdomen is nondistended, soft. No organomegaly or masses felt. Normal bowel sounds heard. Central nervous system: Alert and oriented. No focal neurological deficits. Extremities: Symmetric 5 x 5 power.   Data Reviewed: I have personally reviewed following labs and imaging studies  CBC: Recent Labs  Lab 03/01/17 1036 03/02/17 0517  WBC 8.3 2.9*  HGB 13.3 10.5*  HCT 40.3 32.4*  MCV 86.1 86.2  PLT 361 062   Basic Metabolic Panel: Recent Labs  Lab 03/01/17 1036 03/02/17 0517  NA 137 139  K 2.9* 3.6  CL 99* 108  CO2 26 25  GLUCOSE 116* 154*  BUN 11 6  CREATININE 0.73 0.61  CALCIUM 9.8 8.8*  MG 1.6* 1.8   GFR: Estimated Creatinine Clearance: 83.5 mL/min (by C-G formula based on SCr of 0.61 mg/dL). Liver Function Tests: Recent Labs  Lab 03/01/17 1036  AST 43*  ALT 39  ALKPHOS 105  BILITOT 0.6  PROT  9.7*  ALBUMIN 3.7   Recent Labs  Lab 03/01/17 1036  LIPASE 36   No results for input(s): AMMONIA in the last 168 hours. Coagulation Profile: No results for input(s): INR, PROTIME in the last 168 hours. Cardiac Enzymes: No results for input(s): CKTOTAL, CKMB, CKMBINDEX, TROPONINI in the last 168 hours. BNP (last 3 results) No results for input(s): PROBNP in the last 8760 hours. HbA1C: No results for input(s): HGBA1C in the last 72 hours. CBG: No results for input(s): GLUCAP in the last 168 hours. Lipid Profile: No results for input(s): CHOL, HDL, LDLCALC, TRIG, CHOLHDL, LDLDIRECT in the last 72 hours. Thyroid Function Tests: No results for input(s): TSH, T4TOTAL, FREET4, T3FREE, THYROIDAB in the  last 72 hours. Anemia Panel: No results for input(s): VITAMINB12, FOLATE, FERRITIN, TIBC, IRON, RETICCTPCT in the last 72 hours. Urine analysis:    Component Value Date/Time   COLORURINE YELLOW 03/01/2017 1121   APPEARANCEUR TURBID (A) 03/01/2017 1121   LABSPEC 1.026 03/01/2017 1121   PHURINE 5.0 03/01/2017 1121   GLUCOSEU NEGATIVE 03/01/2017 1121   HGBUR NEGATIVE 03/01/2017 1121   BILIRUBINUR NEGATIVE 03/01/2017 1121   KETONESUR 5 (A) 03/01/2017 1121   PROTEINUR 30 (A) 03/01/2017 1121   UROBILINOGEN 0.2 03/29/2013 0919   NITRITE NEGATIVE 03/01/2017 1121   LEUKOCYTESUR NEGATIVE 03/01/2017 1121   Sepsis Labs: @LABRCNTIP (procalcitonin:4,lacticidven:4)  ) Recent Results (from the past 240 hour(s))  C difficile quick scan w PCR reflex     Status: Abnormal   Collection Time: 03/02/17  3:40 AM  Result Value Ref Range Status   C Diff antigen POSITIVE (A) NEGATIVE Final   C Diff toxin NEGATIVE NEGATIVE Final   C Diff interpretation Results are indeterminate. See PCR results.  Final  C. Diff by PCR, Reflexed     Status: None   Collection Time: 03/02/17  3:40 AM  Result Value Ref Range Status   Toxigenic C. Difficile by PCR NEGATIVE NEGATIVE Final    Comment: Patient is colonized with non toxigenic C. difficile. May not need treatment unless significant symptoms are present. Performed at [facility], [place] [address] [place], [place] [address]          Radiology Studies: Ct Abdomen Pelvis W Contrast  Result Date: 03/01/2017 CLINICAL DATA:  History of Crohn's disease with fever vomiting and diarrhea EXAM: CT ABDOMEN AND PELVIS WITH CONTRAST TECHNIQUE: Multidetector CT imaging of the abdomen and pelvis was performed using the standard protocol following bolus administration of intravenous contrast. CONTRAST:  100 cc Isovue-300 intravenous COMPARISON:  07/19/2016, 05/24/2016, 02/20/2016 FINDINGS: Lower chest: Lung bases demonstrate no acute consolidation or pleural effusion.  Normal heart size. Mild coronary calcification Hepatobiliary: No focal hepatic abnormality. Surgical clips at the gallbladder fossa. Stable prominent extrahepatic bile duct. Pancreas: Unremarkable. No pancreatic ductal dilatation or surrounding inflammatory changes. Spleen: Normal in size without focal abnormality. Adrenals/Urinary Tract: Adrenal glands are within normal limits. No hydronephrosis. Stable subcentimeter exophytic hypodensity mid left kidney. Bladder within normal limits Stomach/Bowel: The stomach is nonenlarged. No dilated small bowel. Re- demonstrated right abdominal post surgical changes consistent with distal small bowel and right colon resection. Fluid present within the remainder of the colon. Prominent fatty hypertrophy consistent with chronic inflammatory process. Mild diffuse mucosal enhancement with mild wall thickening of the descending colon. Vascular/Lymphatic: Mild atherosclerotic calcification. No aneurysmal dilatation. No significantly enlarged lymph nodes Reproductive: Status post hysterectomy. No adnexal masses. Other: Negative for free air or free fluid. Musculoskeletal: Degenerative changes. No acute or suspicious abnormality.  IMPRESSION: 1. Stable post- operative changes in the right abdomen without evidence for an obstruction 2. Fluid-filled residual colon with prominent fatty wall thickening consistent with chronic inflammatory process. Mild diffuse mucosal enhancement of colon with mild wall thickening of the proximal descending colon, suspect mild degree of superimposed acute inflammation/colitis. Electronically Signed   By: Donavan Foil M.D.   On: 03/01/2017 15:54        Scheduled Meds: . cyanocobalamin  1,000 mcg Subcutaneous Q30 days  . dicyclomine  10 mg Oral TID AC  . propranolol ER  80 mg Oral Daily  . topiramate  50 mg Oral QHS   Continuous Infusions:   LOS: 0 days    Time spent:45 Minutes    Dana Allan, MD  [facility] Pager #: [phone] 7PM-7AM contact night coverage as above

## 2017-03-02 NOTE — Progress Notes (Addendum)
Initial Nutrition Assessment  DOCUMENTATION CODES:   Non-severe (moderate) malnutrition in context of chronic illness  INTERVENTION:    Monitor for diet advancement/toleration  Magic cup BID with meals, each supplement provides 290 kcal and 9 grams of protein  NUTRITION DIAGNOSIS:   Moderate Malnutrition related to chronic illness(Crohn'c disease) as evidenced by 10% weight loss in four months, energy intake < or equal to 50% for > or equal to 1 month.  GOAL:   Patient will meet greater than or equal to 90% of their needs   MONITOR:   PO intake, Supplement acceptance, Diet advancement, Weight trends, Labs  REASON FOR ASSESSMENT:   Malnutrition Screening Tool    ASSESSMENT:   Pt with PMH significant Crohn's disease, SBO s/p several resections, HLD, HTN, and depression. Presents this admission with acute on chronic nausea/voimiting (uses Phenergran suppositories) Crohn's colitis. GI following.    Spoke with pt at bedside. Reports having decreased PO intake for 5 weeks due to ongoing nausea/vomiting. States she would consume bites of a biscuit for breakfast and noodles for lunch during this time period. Does not use supplementation at home as they make symptoms worse. Pt had chicken broth this morning and immediately felt nauseous. Amendable to trying magic cup this hospital stay. Will monitor for diet toleration and supplement acceptance.   Pt reports a UBW of 196 lb the last time being at that weight 5 weeks ago. Records indicate pt weighed 191 lb in August 2018 and 172 lb this admission. This shows a 10% weight loss in 4 months which is significant.   Nutrition-Focused physical exam completed.   Medications reviewed and include: Vit B12 Labs reviewed: AST 43 (H)  NUTRITION - FOCUSED PHYSICAL EXAM:    Most Recent Value  Orbital Region  No depletion  Upper Arm Region  No depletion  Thoracic and Lumbar Region  Unable to assess  Buccal Region  No depletion  Temple  Region  No depletion  Clavicle Bone Region  No depletion  Clavicle and Acromion Bone Region  No depletion  Scapular Bone Region  Unable to assess  Dorsal Hand  No depletion  Patellar Region  No depletion  Anterior Thigh Region  No depletion  Posterior Calf Region  No depletion  Edema (RD Assessment)  None  Hair  Reviewed  Eyes  Reviewed  Mouth  Reviewed  Skin  Reviewed  Nails  Reviewed       Diet Order:  Diet full liquid Room service appropriate? Yes; Fluid consistency: Thin  EDUCATION NEEDS:   Education needs have been addressed  Skin:  Skin Assessment: Reviewed RN Assessment  Last BM:  03/01/17  Height:   Ht Readings from Last 1 Encounters:  03/01/17 5' 7"  (1.702 m)    Weight:   Wt Readings from Last 1 Encounters:  03/01/17 172 lb (78 kg)    Ideal Body Weight:  61.4 kg  BMI:  Body mass index is 26.94 kg/m.  Estimated Nutritional Needs:   Kcal:  1600-1800 kcal/day  Protein:  80-90 g/day  Fluid:  >1.6 L/day    Mariana Single RD, LDN Clinical Nutrition Pager # - [phone]

## 2017-03-02 NOTE — Progress Notes (Signed)
Progress Note   Subjective  Chief Complaint: Nausea, vomiting, diarrhea and lower abdominal pain  Patient reports that last night was "rough".  She reports dry heaving and constant nausea as well as continued loose stools, 3 this morning with a "raw bottom".  Patient tells me they have been inserting Phenergan suppositories and this makes her rectal discomfort worse.  She asked if we can do something else.  Patient was able to eat a small amount of chicken broth and some pudding last night, but tells me that this resulted in worsening of her nausea.  She continues with lower abdominal cramping which has been unchanged overnight.   Objective   Vital signs in last 24 hours: Temp:  [98 F (36.7 C)-99.6 F (37.6 C)] 98.3 F (36.8 C) (12/27 0536) Pulse Rate:  [71-106] 71 (12/27 0536) Resp:  [13-20] 18 (12/27 0536) BP: (116-154)/(67-90) 145/70 (12/27 0536) SpO2:  [82 %-99 %] 96 % (12/27 0536) Last BM Date: 03/01/17 General:    white female in NAD Heart:  Regular rate and rhythm; no murmurs Lungs: Respirations even and unlabored, lungs CTA bilaterally Abdomen:  Soft, moderate b/l lower abdominal pain and nondistended. Normal bowel sounds. Extremities:  Without edema. Neurologic:  Alert and oriented,  grossly normal neurologically. Psych:  Cooperative. Normal mood and affect.  Intake/Output from previous day: 12/26 0701 - 12/27 0700 In: 3883.6 [P.O.:720; I.V.:713.6; IV Piggyback:2450] Out: -  Intake/Output this shift: Total I/O In: 240 [P.O.:240] Out: -   Lab Results: Recent Labs    03/01/17 1036 03/02/17 0517  WBC 8.3 2.9*  HGB 13.3 10.5*  HCT 40.3 32.4*  PLT 361 258   BMET Recent Labs    03/01/17 1036 03/02/17 0517  NA 137 139  K 2.9* 3.6  CL 99* 108  CO2 26 25  GLUCOSE 116* 154*  BUN 11 6  CREATININE 0.73 0.61  CALCIUM 9.8 8.8*   LFT Recent Labs    03/01/17 1036  PROT 9.7*  ALBUMIN 3.7  AST 43*  ALT 39  ALKPHOS 105  BILITOT 0.6     Studies/Results: Ct Abdomen Pelvis W Contrast  Result Date: 03/01/2017 CLINICAL DATA:  History of Crohn's disease with fever vomiting and diarrhea EXAM: CT ABDOMEN AND PELVIS WITH CONTRAST TECHNIQUE: Multidetector CT imaging of the abdomen and pelvis was performed using the standard protocol following bolus administration of intravenous contrast. CONTRAST:  100 cc Isovue-300 intravenous COMPARISON:  07/19/2016, 05/24/2016, 02/20/2016 FINDINGS: Lower chest: Lung bases demonstrate no acute consolidation or pleural effusion. Normal heart size. Mild coronary calcification Hepatobiliary: No focal hepatic abnormality. Surgical clips at the gallbladder fossa. Stable prominent extrahepatic bile duct. Pancreas: Unremarkable. No pancreatic ductal dilatation or surrounding inflammatory changes. Spleen: Normal in size without focal abnormality. Adrenals/Urinary Tract: Adrenal glands are within normal limits. No hydronephrosis. Stable subcentimeter exophytic hypodensity mid left kidney. Bladder within normal limits Stomach/Bowel: The stomach is nonenlarged. No dilated small bowel. Re- demonstrated right abdominal post surgical changes consistent with distal small bowel and right colon resection. Fluid present within the remainder of the colon. Prominent fatty hypertrophy consistent with chronic inflammatory process. Mild diffuse mucosal enhancement with mild wall thickening of the descending colon. Vascular/Lymphatic: Mild atherosclerotic calcification. No aneurysmal dilatation. No significantly enlarged lymph nodes Reproductive: Status post hysterectomy. No adnexal masses. Other: Negative for free air or free fluid. Musculoskeletal: Degenerative changes. No acute or suspicious abnormality. IMPRESSION: 1. Stable post- operative changes in the right abdomen without evidence for an obstruction 2. Fluid-filled residual colon  with prominent fatty wall thickening consistent with chronic inflammatory process. Mild diffuse  mucosal enhancement of colon with mild wall thickening of the proximal descending colon, suspect mild degree of superimposed acute inflammation/colitis. Electronically Signed   By: Donavan Foil M.D.   On: 03/01/2017 15:54    Assessment / Plan:    Assessment: 1. Nausea and Vomiting: Acute on chronic, patient was able to drink a small amount of chicken broth last night, but this worsened her symptoms, 7 pound weight loss recently, known gastroparesis unresponsive to 5 mg of Reglan 4 times daily at home, no immediate improvement overnight, no signs of bowel obstruction 2. Lower abdominal pain: Continues today 3. H/o Crohn's Disease: History of steroid dependence, switch 20, her last dose 02/14/17, chronic diarrhea with 7-10 bowel movements per day, now with bilateral lower abdominal pain, CRP is minimally elevated at 5  Plan: 1. Will d/c Prednisolone- per Dr. Loletha Carrow, we do not believe this is Crohn's flare 2. Continue Reglan 80m q6h 3. Continue Ondansetron and Phenergan-will change from suppository 4. Cdiff antigen + but PCR negative 5. Continue Bentyl 116mTID 6. Please await any further recommendations from Dr. DaLoletha Carrowater today  Thank you for your kind consultation, we will continue to follow.   LOS: 0 days   JeLevin Erp12/27/2018, 10:10 AM  Pager # [phone] have discussed the case with the PA, and that is the plan I formulated. I personally interviewed and examined the patient.  LiLaraas similar complaints to yesterday, though her nausea and vomiting are somewhat improved on higher dose Reglan. She has had very long-standing chronic abdominal pain and diarrhea, I do not expect that we will solution to those during this hospital stay. 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 She has no chronic headache or visual changes suggestive central cause of nausea. However that can be considered if not previously  worked up.  She is no longer on steroids, but was prednisone dependent years ago. Adrenal insufficiency should be considered.  Her C. difficile testing as not consistent with active infection.  I do not think she has active Crohn's colitis.  We will continue current dosing of IV Reglan, stop the prednisolone, continue Bentyl. We have ordered an a.m. cortisol level to rule out adrenal insufficiency.   Total time 25 minutes  HeNelida MeuseII Pager [phone]-Fri 8a-5p [phone] 5p, weekends, holidays

## 2017-03-02 NOTE — Progress Notes (Signed)
Date: March 02, 2017 Velva Harman, BSN, [place], [place] Chart and notes review for patient progress and needs. Will follow for case management and discharge needs. Next review date: [id number]

## 2017-03-03 DIAGNOSIS — Z87891 Personal history of nicotine dependence: Secondary | ICD-10-CM | POA: Diagnosis not present

## 2017-03-03 DIAGNOSIS — K5090 Crohn's disease, unspecified, without complications: Secondary | ICD-10-CM | POA: Diagnosis present

## 2017-03-03 DIAGNOSIS — D638 Anemia in other chronic diseases classified elsewhere: Secondary | ICD-10-CM | POA: Diagnosis present

## 2017-03-03 DIAGNOSIS — G8929 Other chronic pain: Secondary | ICD-10-CM | POA: Diagnosis present

## 2017-03-03 DIAGNOSIS — K5010 Crohn's disease of large intestine without complications: Secondary | ICD-10-CM | POA: Diagnosis present

## 2017-03-03 DIAGNOSIS — K8689 Other specified diseases of pancreas: Secondary | ICD-10-CM | POA: Diagnosis present

## 2017-03-03 DIAGNOSIS — K50119 Crohn's disease of large intestine with unspecified complications: Secondary | ICD-10-CM

## 2017-03-03 DIAGNOSIS — R112 Nausea with vomiting, unspecified: Secondary | ICD-10-CM | POA: Diagnosis not present

## 2017-03-03 DIAGNOSIS — Z6826 Body mass index (BMI) 26.0-26.9, adult: Secondary | ICD-10-CM | POA: Diagnosis not present

## 2017-03-03 DIAGNOSIS — I10 Essential (primary) hypertension: Secondary | ICD-10-CM | POA: Diagnosis present

## 2017-03-03 DIAGNOSIS — Z7952 Long term (current) use of systemic steroids: Secondary | ICD-10-CM | POA: Diagnosis not present

## 2017-03-03 DIAGNOSIS — L409 Psoriasis, unspecified: Secondary | ICD-10-CM | POA: Diagnosis present

## 2017-03-03 DIAGNOSIS — R1030 Lower abdominal pain, unspecified: Secondary | ICD-10-CM | POA: Diagnosis not present

## 2017-03-03 DIAGNOSIS — K3184 Gastroparesis: Secondary | ICD-10-CM | POA: Diagnosis present

## 2017-03-03 DIAGNOSIS — R197 Diarrhea, unspecified: Secondary | ICD-10-CM | POA: Diagnosis not present

## 2017-03-03 DIAGNOSIS — K861 Other chronic pancreatitis: Secondary | ICD-10-CM | POA: Diagnosis present

## 2017-03-03 DIAGNOSIS — R634 Abnormal weight loss: Secondary | ICD-10-CM | POA: Diagnosis not present

## 2017-03-03 DIAGNOSIS — F329 Major depressive disorder, single episode, unspecified: Secondary | ICD-10-CM | POA: Diagnosis present

## 2017-03-03 DIAGNOSIS — E538 Deficiency of other specified B group vitamins: Secondary | ICD-10-CM | POA: Diagnosis present

## 2017-03-03 DIAGNOSIS — Z885 Allergy status to narcotic agent status: Secondary | ICD-10-CM | POA: Diagnosis not present

## 2017-03-03 DIAGNOSIS — K219 Gastro-esophageal reflux disease without esophagitis: Secondary | ICD-10-CM | POA: Diagnosis present

## 2017-03-03 DIAGNOSIS — E440 Moderate protein-calorie malnutrition: Secondary | ICD-10-CM | POA: Diagnosis present

## 2017-03-03 DIAGNOSIS — E785 Hyperlipidemia, unspecified: Secondary | ICD-10-CM | POA: Diagnosis present

## 2017-03-03 DIAGNOSIS — J45909 Unspecified asthma, uncomplicated: Secondary | ICD-10-CM | POA: Diagnosis present

## 2017-03-03 DIAGNOSIS — Z79899 Other long term (current) drug therapy: Secondary | ICD-10-CM | POA: Diagnosis not present

## 2017-03-03 DIAGNOSIS — K5000 Crohn's disease of small intestine without complications: Secondary | ICD-10-CM | POA: Diagnosis not present

## 2017-03-03 DIAGNOSIS — E876 Hypokalemia: Secondary | ICD-10-CM | POA: Diagnosis present

## 2017-03-03 DIAGNOSIS — K529 Noninfective gastroenteritis and colitis, unspecified: Secondary | ICD-10-CM

## 2017-03-03 LAB — CORTISOL-AM, BLOOD: Cortisol - AM: 0.6 ug/dL — ABNORMAL LOW (ref 6.7–22.6)

## 2017-03-03 MED ORDER — COSYNTROPIN 0.25 MG IJ SOLR
0.2500 mg | Freq: Once | INTRAMUSCULAR | Status: AC
  Administered 2017-03-04: 0.25 mg via INTRAVENOUS
  Filled 2017-03-03: qty 0.25

## 2017-03-03 MED ORDER — METOCLOPRAMIDE HCL 5 MG/ML IJ SOLN: 10.0000 mg | Freq: Three times a day (TID) | INTRAMUSCULAR | Status: DC

## 2017-03-03 MED ORDER — METOCLOPRAMIDE HCL 5 MG/ML IJ SOLN
5.0000 mg | Freq: Three times a day (TID) | INTRAMUSCULAR | Status: DC
  Administered 2017-03-03 – 2017-03-04 (×3): 5 mg via INTRAVENOUS
  Filled 2017-03-03 (×3): qty 2

## 2017-03-03 MED ORDER — METOCLOPRAMIDE HCL 5 MG/ML IJ SOLN
10.0000 mg | Freq: Three times a day (TID) | INTRAMUSCULAR | Status: DC
  Administered 2017-03-03 (×2): 10 mg via INTRAVENOUS
  Filled 2017-03-03 (×2): qty 2

## 2017-03-03 MED ORDER — ENOXAPARIN SODIUM 40 MG/0.4ML ~~LOC~~ SOLN
40.0000 mg | SUBCUTANEOUS | Status: DC
  Filled 2017-03-03: qty 0.4

## 2017-03-03 NOTE — Progress Notes (Signed)
PROGRESS NOTE  [name]  TKW:[id number] DOB: 1961/01/08 DOA: 03/01/2017 PCP: Rochel Brome, MD   Brief Narrative: [name] is a 56 y.o. female with a history of Crohn's colitis s/p terminal ileum resections in 2008 and 2012 on entyvio, psoriasis, gastroparesis, and chronic pancreatic insufficiency who presented with several weeks of nausea, vomiting, worse-than-usual diarrhea (10 stools/day) and lower abdominal pain associated with poor appetite and weight loss. She was admitted due to intractable nausea and vomiting. Reglan dose was increased, and further antiemetics have been limited by prolonged QT interval. Exam has been benign, CT scan did not show evidence of obstruction or active colitis. GI is following.   Assessment & Plan: Principal Problem:   Crohn's colitis ([place]) Active Problems:   Crohn's disease ([place])   Asthma   Nausea & vomiting    Abdominal pain   Hypokalemia   Weight loss   Malnutrition of moderate degree   CC (Crohn's colitis) ([facility])  Intractable nausea, vomiting, diarrhea: Suspect chronic GI symptoms are related to Crohn's, ileal resection, malabsorption from pancreatic insufficiency, possibly an element of IBS and/or small bowel overgrowth. Carries Dx gastroparesis. Stool studies not consistent with CDiff infection.  - Monitor off IVF's for now, advancing diet slowly. DC in AM if improvement continues.  - No longer on abx. - Continue scheduled reglan 64m, up from home 548mdoses.  - Trial bentyl per GI - GI consider empiric rifaximin for SIBO, to be discussed further as outpatient  Low AM cortisol level: In setting of having been on solumedrol earlier this admission, and no hypotension/hypoglycemia, I'm not sure of the significance/reliability of this finding. Has been off prednisone for a few months. - Will order stim test and treat if indicated.   Moderate protein-calorie malnutrition: Noted report of 21lbs wt loss, found to be 7lbs based on  outpatient weights. - RD consulted, supplement w/magic cup BID  DVT prophylaxis: Lovenox ordered. Though pt is ambulatory, she has IBD and is at risk of DVT. Code Status: Full Family Communication: Husband at bedside Disposition Plan: Home in AM if stable.  Consultants:   GI  Procedures:   None  Antimicrobials:  Cipro/flagyl 12/26.    Subjective: Tolerated broth, having ~10 loose stools daily still, lower abdominal cramping pain is improved w/stools, but remains in general. Wants to take a shower.   Objective: Vitals:   03/02/17 1318 03/02/17 2046 03/03/17 0523 03/03/17 1334  BP: 133/79 (!) 149/77 (!) 170/73 (!) 152/88  Pulse: 72 65 69 63  Resp: 18 20 16 18   Temp: 98.4 F (36.9 C) 98.2 F (36.8 C) 98 F (36.7 C) 98.3 F (36.8 C)  TempSrc: Axillary Oral Oral Oral  SpO2: 98%  99% 98%  Weight:      Height:        Intake/Output Summary (Last 24 hours) at 03/03/2017 1424 Last data filed at 03/03/2017 1338 Gross per 24 hour  Intake 480 ml  Output -  Net 480 ml   Filed Weights   03/01/17 1006  Weight: 78 kg (172 lb)    Gen: [age].o. female in no distress  Pulm: Non-labored breathing. Clear to auscultation bilaterally.  CV: Regular rate and rhythm. No murmur, rub, or gallop. No JVD, no pedal edema. GI: Abdomen soft, tender along lower abdomen without rebound or guarding, non-distended, with normoactive bowel sounds. No organomegaly or masses felt. Ext: Warm, no deformities Skin: Psoriatic lesions on dorsal forearm/elbows and anterior knees. Neuro: Alert and oriented. No focal neurological deficits.  Psych: Judgement and insight appear normal. Mood & affect appropriate.   Data Reviewed: I have personally reviewed following labs and imaging studies  CBC: Recent Labs  Lab 03/01/17 1036 03/02/17 0517  WBC 8.3 2.9*  HGB 13.3 10.5*  HCT 40.3 32.4*  MCV 86.1 86.2  PLT 361 161   Basic Metabolic Panel: Recent Labs  Lab 03/01/17 1036 03/02/17 0517  NA 137  139  K 2.9* 3.6  CL 99* 108  CO2 26 25  GLUCOSE 116* 154*  BUN 11 6  CREATININE 0.73 0.61  CALCIUM 9.8 8.8*  MG 1.6* 1.8   GFR: Estimated Creatinine Clearance: 83.5 mL/min (by C-G formula based on SCr of 0.61 mg/dL). Liver Function Tests: Recent Labs  Lab 03/01/17 1036  AST 43*  ALT 39  ALKPHOS 105  BILITOT 0.6  PROT 9.7*  ALBUMIN 3.7   Recent Labs  Lab 03/01/17 1036  LIPASE 36   No results for input(s): AMMONIA in the last 168 hours. Coagulation Profile: No results for input(s): INR, PROTIME in the last 168 hours. Cardiac Enzymes: No results for input(s): CKTOTAL, CKMB, CKMBINDEX, TROPONINI in the last 168 hours. BNP (last 3 results) No results for input(s): PROBNP in the last 8760 hours. HbA1C: No results for input(s): HGBA1C in the last 72 hours. CBG: No results for input(s): GLUCAP in the last 168 hours. Lipid Profile: No results for input(s): CHOL, HDL, LDLCALC, TRIG, CHOLHDL, LDLDIRECT in the last 72 hours. Thyroid Function Tests: No results for input(s): TSH, T4TOTAL, FREET4, T3FREE, THYROIDAB in the last 72 hours. Anemia Panel: No results for input(s): VITAMINB12, FOLATE, FERRITIN, TIBC, IRON, RETICCTPCT in the last 72 hours. Urine analysis:    Component Value Date/Time   COLORURINE YELLOW 03/01/2017 1121   APPEARANCEUR TURBID (A) 03/01/2017 1121   LABSPEC 1.026 03/01/2017 1121   PHURINE 5.0 03/01/2017 1121   GLUCOSEU NEGATIVE 03/01/2017 1121   HGBUR NEGATIVE 03/01/2017 1121   BILIRUBINUR NEGATIVE 03/01/2017 1121   KETONESUR 5 (A) 03/01/2017 1121   PROTEINUR 30 (A) 03/01/2017 1121   UROBILINOGEN 0.2 03/29/2013 0919   NITRITE NEGATIVE 03/01/2017 1121   LEUKOCYTESUR NEGATIVE 03/01/2017 1121   Recent Results (from the past 240 hour(s))  C difficile quick scan w PCR reflex     Status: Abnormal   Collection Time: 03/02/17  3:40 AM  Result Value Ref Range Status   C Diff antigen POSITIVE (A) NEGATIVE Final   C Diff toxin NEGATIVE NEGATIVE Final   C  Diff interpretation Results are indeterminate. See PCR results.  Final  C. Diff by PCR, Reflexed     Status: None   Collection Time: 03/02/17  3:40 AM  Result Value Ref Range Status   Toxigenic C. Difficile by PCR NEGATIVE NEGATIVE Final    Comment: Patient is colonized with non toxigenic C. difficile. May not need treatment unless significant symptoms are present. Performed at [facility], [place] [address], [place], [place] [address]       Radiology Studies: Ct Abdomen Pelvis W Contrast  Result Date: 03/01/2017 CLINICAL DATA:  History of Crohn's disease with fever vomiting and diarrhea EXAM: CT ABDOMEN AND PELVIS WITH CONTRAST TECHNIQUE: Multidetector CT imaging of the abdomen and pelvis was performed using the standard protocol following bolus administration of intravenous contrast. CONTRAST:  100 cc Isovue-300 intravenous COMPARISON:  07/19/2016, 05/24/2016, 02/20/2016 FINDINGS: Lower chest: Lung bases demonstrate no acute consolidation or pleural effusion. Normal heart size. Mild coronary calcification Hepatobiliary: No focal hepatic abnormality. Surgical clips at  the gallbladder fossa. Stable prominent extrahepatic bile duct. Pancreas: Unremarkable. No pancreatic ductal dilatation or surrounding inflammatory changes. Spleen: Normal in size without focal abnormality. Adrenals/Urinary Tract: Adrenal glands are within normal limits. No hydronephrosis. Stable subcentimeter exophytic hypodensity mid left kidney. Bladder within normal limits Stomach/Bowel: The stomach is nonenlarged. No dilated small bowel. Re- demonstrated right abdominal post surgical changes consistent with distal small bowel and right colon resection. Fluid present within the remainder of the colon. Prominent fatty hypertrophy consistent with chronic inflammatory process. Mild diffuse mucosal enhancement with mild wall thickening of the descending colon. Vascular/Lymphatic: Mild atherosclerotic calcification. No aneurysmal  dilatation. No significantly enlarged lymph nodes Reproductive: Status post hysterectomy. No adnexal masses. Other: Negative for free air or free fluid. Musculoskeletal: Degenerative changes. No acute or suspicious abnormality. IMPRESSION: 1. Stable post- operative changes in the right abdomen without evidence for an obstruction 2. Fluid-filled residual colon with prominent fatty wall thickening consistent with chronic inflammatory process. Mild diffuse mucosal enhancement of colon with mild wall thickening of the proximal descending colon, suspect mild degree of superimposed acute inflammation/colitis. Electronically Signed   By: Donavan Foil M.D.   On: 03/01/2017 15:54    Scheduled Meds: . [START ON 03/04/2017] cosyntropin  0.25 mg Intravenous Once  . cyanocobalamin  1,000 mcg Subcutaneous Q30 days  . dicyclomine  10 mg Oral TID AC  . feeding supplement  1 Container Oral TID BM  . metoCLOPramide (REGLAN) injection  10 mg Intravenous TID AC & HS  . propranolol ER  80 mg Oral Daily  . topiramate  50 mg Oral QHS   Continuous Infusions:   LOS: 0 days   Time spent: 25 minutes.  Vance Gather, MD [facility] Pager [phone]  If 7PM-7AM, please contact night-coverage [email or web address] Password [facility] 03/03/2017, 2:24 PM

## 2017-03-03 NOTE — Progress Notes (Signed)
Progress Note   Subjective  Chief Complaint: Nausea, Vomiting, Diarrhea and Lower abdominal pain  Today, the patient appears much clearer and more alert, she is in good spirits and tells me that she had a good day yesterday and was able to keep in some of her full liquid diet. Overall she is feeling better with decreased nausea, vomiting and abdominal pain. She has continued with diarrhea, but again this is chronic for her. She is excited as she is getting ready to take a shower.   Objective   Vital signs in last 24 hours: Temp:  [98 F (36.7 C)-98.4 F (36.9 C)] 98 F (36.7 C) (12/28 0523) Pulse Rate:  [65-72] 69 (12/28 0523) Resp:  [16-20] 16 (12/28 0523) BP: (133-170)/(73-79) 170/73 (12/28 0523) SpO2:  [98 %-99 %] 99 % (12/28 0523) Last BM Date: 03/02/17 General:    white female in NAD Heart:  Regular rate and rhythm; no murmurs Lungs: Respirations even and unlabored, lungs CTA bilaterally Abdomen:  Soft, mild generalized ttp, and nondistended. Normal bowel sounds. Extremities:  Without edema. Neurologic:  Alert and oriented,  grossly normal neurologically. Psych:  Cooperative. Normal mood and affect.  Intake/Output from previous day: 12/27 0701 - 12/28 0700 In: 480 [P.O.:480] Out: -  Intake/Output this shift: Total I/O In: 240 [P.O.:240] Out: -   Lab Results: Recent Labs    03/01/17 1036 03/02/17 0517  WBC 8.3 2.9*  HGB 13.3 10.5*  HCT 40.3 32.4*  PLT 361 258   BMET Recent Labs    03/01/17 1036 03/02/17 0517  NA 137 139  K 2.9* 3.6  CL 99* 108  CO2 26 25  GLUCOSE 116* 154*  BUN 11 6  CREATININE 0.73 0.61  CALCIUM 9.8 8.8*   LFT Recent Labs    03/01/17 1036  PROT 9.7*  ALBUMIN 3.7  AST 43*  ALT 39  ALKPHOS 105  BILITOT 0.6   Studies/Results: Ct Abdomen Pelvis W Contrast  Result Date: 03/01/2017 CLINICAL DATA:  History of Crohn's disease with fever vomiting and diarrhea EXAM: CT ABDOMEN AND PELVIS WITH CONTRAST TECHNIQUE: Multidetector  CT imaging of the abdomen and pelvis was performed using the standard protocol following bolus administration of intravenous contrast. CONTRAST:  100 cc Isovue-300 intravenous COMPARISON:  07/19/2016, 05/24/2016, 02/20/2016 FINDINGS: Lower chest: Lung bases demonstrate no acute consolidation or pleural effusion. Normal heart size. Mild coronary calcification Hepatobiliary: No focal hepatic abnormality. Surgical clips at the gallbladder fossa. Stable prominent extrahepatic bile duct. Pancreas: Unremarkable. No pancreatic ductal dilatation or surrounding inflammatory changes. Spleen: Normal in size without focal abnormality. Adrenals/Urinary Tract: Adrenal glands are within normal limits. No hydronephrosis. Stable subcentimeter exophytic hypodensity mid left kidney. Bladder within normal limits Stomach/Bowel: The stomach is nonenlarged. No dilated small bowel. Re- demonstrated right abdominal post surgical changes consistent with distal small bowel and right colon resection. Fluid present within the remainder of the colon. Prominent fatty hypertrophy consistent with chronic inflammatory process. Mild diffuse mucosal enhancement with mild wall thickening of the descending colon. Vascular/Lymphatic: Mild atherosclerotic calcification. No aneurysmal dilatation. No significantly enlarged lymph nodes Reproductive: Status post hysterectomy. No adnexal masses. Other: Negative for free air or free fluid. Musculoskeletal: Degenerative changes. No acute or suspicious abnormality. IMPRESSION: 1. Stable post- operative changes in the right abdomen without evidence for an obstruction 2. Fluid-filled residual colon with prominent fatty wall thickening consistent with chronic inflammatory process. Mild diffuse mucosal enhancement of colon with mild wall thickening of the proximal descending colon, suspect mild degree  of superimposed acute inflammation/colitis. Electronically Signed   By: Donavan Foil M.D.   On: 03/01/2017 15:54     Assessment / Plan:   Assessment: 1. Nausea and Vomiting: improving today; consider element of adrenal insufficiency + gastroparesis 2. Lower abdominal pain: better today 3. H/o Crohn's disease: previously steroid dependent, now on Entyvio  Plan: 1. Cortisol level low this morning, discussed with hospitalist, who will treat.  Also briefly discussed with patient. 2. Continue other measures including Reglan 70m qam and qhs (per hospitalist pt had not been getting this-order was d/c after I originally placed it, by someone else) 3. If patient continues to improve, could consider d/c tomorrow with follow up by Dr. NSilverio Decampin our outpatient clinic  4. Agree with increasing diet as tolerated 5. Please await any further recs from Dr. DLoletha Carrowlater today  Thank you for your kind consultation, we will continue to follow.   LOS: 0 days   JLevin Erp 03/03/2017, 10:23 AM  Pager # [phone] I have discussed the case with the PA, and that is the plan I formulated.  She is improved on reglan, I will decrease dose due to prolonged QT on EKG. The significance of low AM cortisol is unclear because she had received a dose of steroids. Cosynotropin test pending.  We will see over weekend if she remains.  If she can keep food down, then she can be discharged for office follow up with Dr. NSilverio Decamp    HNelida MeuseIII Pager [phone] Mon-Fri 8a-5p [phone] 5p, weekends, holidays

## 2017-03-03 NOTE — Progress Notes (Signed)
Pt's BP at 0530 today was 170/73. No PRN hydralazine ordered for elevated BP. Paged on-call MD, no orders put in. Told dayshift nurse to continue to monitor.

## 2017-03-04 LAB — ACTH STIMULATION, 3 TIME POINTS
CORTISOL 60 MIN: 4.6 ug/dL
Cortisol, 30 Min: 4.5 ug/dL
Cortisol, Base: 4.8 ug/dL

## 2017-03-04 MED ORDER — HYDROCORTISONE 5 MG PO TABS: ORAL_TABLET | ORAL | 0 refills | Status: DC

## 2017-03-04 MED ORDER — GUAIFENESIN-DM 100-10 MG/5ML PO SYRP: 5.0000 mL | ORAL_SOLUTION | ORAL | 0 refills | Status: DC | PRN

## 2017-03-04 MED ORDER — HYDROCORTISONE 5 MG PO TABS: 5.0000 mg | ORAL_TABLET | Freq: Every day | ORAL | Status: DC

## 2017-03-04 MED ORDER — GUAIFENESIN-DM 100-10 MG/5ML PO SYRP
5.0000 mL | ORAL_SOLUTION | ORAL | Status: DC | PRN
  Administered 2017-03-04: 5 mL via ORAL
  Filled 2017-03-04: qty 10

## 2017-03-04 MED ORDER — DICYCLOMINE HCL 10 MG PO CAPS: 10.0000 mg | ORAL_CAPSULE | Freq: Three times a day (TID) | ORAL | 0 refills | Status: DC

## 2017-03-04 MED ORDER — HYDROCORTISONE 10 MG PO TABS
10.0000 mg | ORAL_TABLET | Freq: Every day | ORAL | Status: DC
  Administered 2017-03-04: 10 mg via ORAL
  Filled 2017-03-04: qty 1

## 2017-03-04 NOTE — Discharge Summary (Signed)
Physician Discharge Summary  KHRISTIE SAK YTK:[id number] DOB: 1961-02-08 DOA: 03/01/2017  PCP: Rochel Brome, MD  Admit date: 03/01/2017 Discharge date: 03/04/2017  Admitted From: Home Disposition: Home   Recommendations for Outpatient Follow-up:  1. Follow up with PCP in 1-2 weeks 2. Please obtain BMP/CBC in one week 3. Follow up steroid taper for adrenal insufficiency. Started hydrocortisone 70m / 553mdue to insufficient ACTH stim testing.   Home Health: None Equipment/Devices: None Discharge Condition: Stable CODE STATUS: Full Diet recommendation: As tolerated  Brief/Interim Summary: LiHARLYN RATHMANNs a [age].o. female with a history of Crohn's colitis s/p terminal ileum resections in 2008 and 2012 on entyvio, psoriasis, gastroparesis, and chronic pancreatic insufficiency who presented with several weeks of nausea, vomiting, worse-than-usual diarrhea (10 stools/day) and lower abdominal pain associated with poor appetite and weight loss. She was admitted due to intractable nausea and vomiting. Reglan dose was increased, and further antiemetics have been limited by prolonged QT interval. Exam has been benign, CT scan did not show evidence of obstruction or active colitis. GI followed in consultation and recommended restarting home dose reglan at discharge. Symptoms have improved significantly and she is stable for discharge.   Discharge Diagnoses:  Principal Problem:   Crohn's colitis (HCElginActive Problems:   Crohn's disease ([place]  Asthma   Nausea & vomiting    Abdominal pain   Hypokalemia   Weight loss   Malnutrition of moderate degree   CC (Crohn's colitis) (HCC)  Intractable nausea, vomiting, diarrhea: Suspect chronic GI symptoms are related to Crohn's, ileal resection, malabsorption from pancreatic insufficiency, possibly an element of IBS and/or small bowel overgrowth. Carries Dx gastroparesis. Stool studies not consistent with CDiff infection.  - Continue  scheduled reglan at home 54m20moses, can take prn 30m56mses if symptoms worsen significantly - Trial bentyl per GI, will continue at discharge. - GI consider empiric rifaximin for SIBO, to be discussed further as outpatient  Low cortisol level: In setting of having been on solumedrol earlier this admission, and no hypotension/hypoglycemia, uncertain of significance/reliability of this finding. Stim test showed cortisol level of 4.8, and 4.5 and 4.6 at 30 minutes and 60 minutes respectively. Has been off prednisone for a few months and has improved without starting steroids.  - Will start hydrocortisone and will need outpatient follow up.  Moderate protein-calorie malnutrition: Noted report of 21lbs wt loss, found to be 7lbs based on outpatient weights. - RD consulted, supplement was ordered  Discharge Instructions  Allergies as of 03/04/2017      Reactions   Codeine Hives, Nausea And Vomiting   Humira [adalimumab] Rash      Medication List    TAKE these medications   cyanocobalamin 1000 MCG/ML injection Commonly known as:  (VITAMIN B-12) Inject 1 mL (1,000 mcg total) into the skin every 30 (thirty) days.   dicyclomine 10 MG capsule Commonly known as:  BENTYL Take 1 capsule (10 mg total) by mouth 3 (three) times daily before meals.   econazole nitrate 1 % cream APPLY  CREAM TOPICALLY ONCE DAILY   ENTYVIO IV Inject into the vein every 8 (eight) weeks. Receives at [facility]apentin 400 MG capsule Commonly known as:  NEURONTIN Take 1 capsule by mouth at lunch, 2 at dinner and 3 at bedtime   guaiFENesin-dextromethorphan 100-10 MG/5ML syrup Commonly known as:  ROBITUSSIN DM Take 5 mLs by mouth every 4 (four) hours as needed for cough.   lipase/protease/amylase 36000 UNITS Cpep capsule  Commonly known as:  CREON 2 capsules (72,000) by mouth with meals and 1 (36,000) with snacks   metoCLOPramide 5 MG tablet Commonly known as:  REGLAN Take 1 tablet (5 mg total) by  mouth 4 (four) times daily. Take 1 tablet before each meal and 1 tablet at bedtime for 2 weeks.   Needles & Syringes Misc 1 Syringe by Does not apply route every 30 (thirty) days.   omeprazole 40 MG capsule Commonly known as:  PRILOSEC Take 1 capsule (40 mg total) by mouth daily.   Oxycodone HCl 10 MG Tabs Take 10 mg by mouth See admin instructions. 1 tablet up to five times a day as needed for severe pain.   PROAIR HFA 108 (90 Base) MCG/ACT inhaler Generic drug:  albuterol Inhale 2 puffs into the lungs every 4 (four) hours as needed for wheezing or shortness of breath.   PROCTOCORT 1 % Crea Generic drug:  hydrocortisone Apply 1 application topically daily as needed (pain).   promethazine 25 MG suppository Commonly known as:  PHENERGAN Place 1 suppository (25 mg total) rectally every 6 (six) hours as needed for nausea or vomiting.   promethazine 25 MG tablet Commonly known as:  PHENERGAN TAKE 1 TABLET BY MOUTH EVERY 6 HOURS AS NEEDED FOR NAUSEA AND VOMITING   propranolol ER 80 MG 24 hr capsule Commonly known as:  INDERAL LA Take 1 capsule by mouth daily.   SYMBICORT 80-4.5 MCG/ACT inhaler Generic drug:  budesonide-formoterol Inhale 2 puffs into the lungs daily as needed (SOB, wheezing).   tiZANidine 4 MG tablet Commonly known as:  ZANAFLEX Take 4 mg by mouth every 8 (eight) hours as needed for muscle spasms.   topiramate 50 MG tablet Commonly known as:  TOPAMAX TAKE ONE TABLET BY MOUTH AT BEDTIME   zolmitriptan 5 MG nasal solution Commonly known as:  ZOMIG 1 spray in one nostril.  May repeat 1 spray once after 2 hours if needed. Not to exceed 2 sprays in 24 hours What changed:    how much to take  how to take this  when to take this  reasons to take this  additional instructions      Follow-up Information    [facility], Elnita Maxwell, MD Follow up.   Specialties:  Family Medicine, Interventional Cardiology, Radiology, Anesthesiology Contact information: [address] [place] [address] [phone]        Mauri Pole, MD. Schedule an appointment as soon as possible for a visit in 2 week(s).   Specialty:  Gastroenterology Contact information: [place] [address] [phone]          Allergies  Allergen Reactions  . Codeine Hives and Nausea And Vomiting  . Humira [Adalimumab] Rash    Consultations:  GI  Procedures/Studies: Ct Abdomen Pelvis W Contrast  Result Date: 03/01/2017 CLINICAL DATA:  History of Crohn's disease with fever vomiting and diarrhea EXAM: CT ABDOMEN AND PELVIS WITH CONTRAST TECHNIQUE: Multidetector CT imaging of the abdomen and pelvis was performed using the standard protocol following bolus administration of intravenous contrast. CONTRAST:  100 cc Isovue-300 intravenous COMPARISON:  07/19/2016, 05/24/2016, 02/20/2016 FINDINGS: Lower chest: Lung bases demonstrate no acute consolidation or pleural effusion. Normal heart size. Mild coronary calcification Hepatobiliary: No focal hepatic abnormality. Surgical clips at the gallbladder fossa. Stable prominent extrahepatic bile duct. Pancreas: Unremarkable. No pancreatic ductal dilatation or surrounding inflammatory changes. Spleen: Normal in size without focal abnormality. Adrenals/Urinary Tract: Adrenal glands are within normal limits.  No hydronephrosis. Stable subcentimeter exophytic hypodensity mid left kidney. Bladder within normal limits Stomach/Bowel: The stomach is nonenlarged. No dilated small bowel. Re- demonstrated right abdominal post surgical changes consistent with distal small bowel and right colon resection. Fluid present within the remainder of the colon. Prominent fatty hypertrophy consistent with chronic inflammatory process. Mild diffuse mucosal enhancement with mild wall thickening of the descending colon. Vascular/Lymphatic: Mild atherosclerotic calcification. No aneurysmal dilatation. No significantly enlarged lymph  nodes Reproductive: Status post hysterectomy. No adnexal masses. Other: Negative for free air or free fluid. Musculoskeletal: Degenerative changes. No acute or suspicious abnormality. IMPRESSION: 1. Stable post- operative changes in the right abdomen without evidence for an obstruction 2. Fluid-filled residual colon with prominent fatty wall thickening consistent with chronic inflammatory process. Mild diffuse mucosal enhancement of colon with mild wall thickening of the proximal descending colon, suspect mild degree of superimposed acute inflammation/colitis. Electronically Signed   By: Donavan Foil M.D.   On: 03/01/2017 15:54   Subjective: Feels better, 1 episode of nausea improved with reglan this AM, only 1 episode of diarrhea today, much better than at admission. Tolerating po without nausea. Ready to go home.   Discharge Exam: Vitals:   03/03/17 2024 03/04/17 0536  BP: 137/68 (!) 174/82  Pulse: 60 61  Resp: 17 18  Temp: 98.7 F (37.1 C) 98.1 F (36.7 C)  SpO2: 97% 99%   General: Pt is alert, awake, not in acute distress Cardiovascular: RRR, S1/S2 +, no rubs, no gallops Respiratory: CTA bilaterally, no wheezing, no rhonchi Abdominal: Soft, minimally diffusely tender mostly in lower quadrants, ND, bowel sounds + Extremities: No edema, no cyanosis  Labs: Basic Metabolic Panel: Recent Labs  Lab 03/01/17 1036 03/02/17 0517  NA 137 139  K 2.9* 3.6  CL 99* 108  CO2 26 25  GLUCOSE 116* 154*  BUN 11 6  CREATININE 0.73 0.61  CALCIUM 9.8 8.8*  MG 1.6* 1.8   Liver Function Tests: Recent Labs  Lab 03/01/17 1036  AST 43*  ALT 39  ALKPHOS 105  BILITOT 0.6  PROT 9.7*  ALBUMIN 3.7   Recent Labs  Lab 03/01/17 1036  LIPASE 36   CBC: Recent Labs  Lab 03/01/17 1036 03/02/17 0517  WBC 8.3 2.9*  HGB 13.3 10.5*  HCT 40.3 32.4*  MCV 86.1 86.2  PLT 361 258   Urinalysis    Component Value Date/Time   COLORURINE YELLOW 03/01/2017 1121   APPEARANCEUR TURBID (A)  03/01/2017 1121   LABSPEC 1.026 03/01/2017 1121   PHURINE 5.0 03/01/2017 1121   GLUCOSEU NEGATIVE 03/01/2017 1121   HGBUR NEGATIVE 03/01/2017 1121   BILIRUBINUR NEGATIVE 03/01/2017 1121   KETONESUR 5 (A) 03/01/2017 1121   PROTEINUR 30 (A) 03/01/2017 1121   UROBILINOGEN 0.2 03/29/2013 0919   NITRITE NEGATIVE 03/01/2017 1121   LEUKOCYTESUR NEGATIVE 03/01/2017 1121    Microbiology Recent Results (from the past 240 hour(s))  C difficile quick scan w PCR reflex     Status: Abnormal   Collection Time: 03/02/17  3:40 AM  Result Value Ref Range Status   C Diff antigen POSITIVE (A) NEGATIVE Final   C Diff toxin NEGATIVE NEGATIVE Final   C Diff interpretation Results are indeterminate. See PCR results.  Final  C. Diff by PCR, Reflexed     Status: None   Collection Time: 03/02/17  3:40 AM  Result Value Ref Range Status   Toxigenic C. Difficile by PCR NEGATIVE NEGATIVE Final    Comment: Patient is colonized with  non toxigenic C. difficile. May not need treatment unless significant symptoms are present. Performed at [facility], [place] [address] [place], [place] [address]     Time coordinating discharge: Approximately 40 minutes  Vance Gather, MD  [facility] 03/04/2017, 10:58 AM Pager [phone]

## 2017-03-10 ENCOUNTER — Other Ambulatory Visit: Payer: Self-pay | Admitting: Gastroenterology

## 2017-03-14 DIAGNOSIS — R1111 Vomiting without nausea: Secondary | ICD-10-CM | POA: Diagnosis not present

## 2017-03-14 DIAGNOSIS — R51 Headache: Secondary | ICD-10-CM | POA: Diagnosis not present

## 2017-03-14 DIAGNOSIS — R05 Cough: Secondary | ICD-10-CM | POA: Diagnosis not present

## 2017-03-14 DIAGNOSIS — R509 Fever, unspecified: Secondary | ICD-10-CM | POA: Diagnosis not present

## 2017-03-14 DIAGNOSIS — K50118 Crohn's disease of large intestine with other complication: Secondary | ICD-10-CM | POA: Diagnosis not present

## 2017-03-28 ENCOUNTER — Other Ambulatory Visit: Payer: Self-pay

## 2017-03-28 DIAGNOSIS — K50118 Crohn's disease of large intestine with other complication: Secondary | ICD-10-CM

## 2017-03-29 DIAGNOSIS — M5137 Other intervertebral disc degeneration, lumbosacral region: Secondary | ICD-10-CM | POA: Diagnosis not present

## 2017-03-29 DIAGNOSIS — M47817 Spondylosis without myelopathy or radiculopathy, lumbosacral region: Secondary | ICD-10-CM | POA: Diagnosis not present

## 2017-03-29 DIAGNOSIS — M25559 Pain in unspecified hip: Secondary | ICD-10-CM | POA: Diagnosis not present

## 2017-03-29 DIAGNOSIS — G894 Chronic pain syndrome: Secondary | ICD-10-CM | POA: Diagnosis not present

## 2017-04-03 DIAGNOSIS — F4542 Pain disorder with related psychological factors: Secondary | ICD-10-CM | POA: Diagnosis not present

## 2017-04-03 DIAGNOSIS — M5137 Other intervertebral disc degeneration, lumbosacral region: Secondary | ICD-10-CM | POA: Diagnosis not present

## 2017-04-03 DIAGNOSIS — M47817 Spondylosis without myelopathy or radiculopathy, lumbosacral region: Secondary | ICD-10-CM | POA: Diagnosis not present

## 2017-04-03 DIAGNOSIS — F413 Other mixed anxiety disorders: Secondary | ICD-10-CM | POA: Diagnosis not present

## 2017-04-03 DIAGNOSIS — G629 Polyneuropathy, unspecified: Secondary | ICD-10-CM | POA: Diagnosis not present

## 2017-04-04 ENCOUNTER — Other Ambulatory Visit: Payer: Self-pay

## 2017-04-04 DIAGNOSIS — K50118 Crohn's disease of large intestine with other complication: Secondary | ICD-10-CM

## 2017-04-11 ENCOUNTER — Telehealth: Payer: Self-pay

## 2017-04-11 ENCOUNTER — Encounter (HOSPITAL_COMMUNITY): Payer: Self-pay

## 2017-04-11 ENCOUNTER — Encounter (HOSPITAL_COMMUNITY)
Admission: RE | Admit: 2017-04-11 | Discharge: 2017-04-11 | Disposition: A | Discharge status: Home / Self Care | Payer: BLUE CROSS/BLUE SHIELD | Source: Ambulatory Visit | Attending: Gastroenterology | Admitting: Gastroenterology

## 2017-04-11 DIAGNOSIS — K50119 Crohn's disease of large intestine with unspecified complications: Secondary | ICD-10-CM | POA: Diagnosis not present

## 2017-04-11 DIAGNOSIS — K50118 Crohn's disease of large intestine with other complication: Secondary | ICD-10-CM

## 2017-04-11 MED ORDER — SODIUM CHLORIDE 0.9 % IV SOLN
Freq: Once | INTRAVENOUS | Status: AC
  Administered 2017-04-11: 10:00:00 via INTRAVENOUS

## 2017-04-11 MED ORDER — VEDOLIZUMAB 300 MG IV SOLR
300.0000 mg | Freq: Once | INTRAVENOUS | Status: AC
  Administered 2017-04-11: 300 mg via INTRAVENOUS
  Filled 2017-04-11: qty 5

## 2017-04-11 NOTE — Progress Notes (Signed)
Post infusion temp and BP slightly elevated. Pt had stated pre infusion that she had vomitting over several days but MD was aware. She was able to eat and drink yesterday. Nash Dimmer left message with [facility] for Dr Peggye Ley, see orders. They also will follow up with Pt.

## 2017-04-11 NOTE — Telephone Encounter (Signed)
Please advise patient to drink atleast 10-12 cups of fluids to maintain hydration. If she is unable to tolerate any PO, will need to come to ER

## 2017-04-11 NOTE — Telephone Encounter (Signed)
Called to the patient's number twice and both times the call sounds as if it connects. No voicemail. No one will speak.

## 2017-04-11 NOTE — Telephone Encounter (Signed)
Call received from [facility] at [facility], [facility] RN.  The patient has received her Entyvio infusion. She has had slightly elevated VS during the infusion. Better now. The patient admits she has had nausea and vomiting in the past 24 hours and diarrhea. The nurse will complete the infusion of the normal saline.

## 2017-04-11 NOTE — Discharge Instructions (Signed)
Vedolizumab injection solution/Entyvio Infusion  What is this medicine? VEDOLIZUMAB (Ve doe LIZ you mab) is used to treat ulcerative colitis and Crohn's disease in adult patients. This medicine may be used for other purposes; ask your health care provider or pharmacist if you have questions. COMMON BRAND NAME(S): Entyvio What should I tell my health care provider before I take this medicine? They need to know if you have any of these conditions: -diabetes -hepatitis B or history of hepatitis B infection -HIV or AIDS -immune system problems -infection or history of infections -liver disease -recently received or scheduled to receive a vaccine -scheduled to have surgery -tuberculosis, a positive skin test for tuberculosis or have recently been in close contact with someone who has tuberculosis - an unusual or allergic reaction to vedolizumab, other medicines, foods, dyes, or preservatives -pregnant or trying to get pregnant -breast-feeding How should I use this medicine? This medicine is for infusion into a vein. It is given by a health care professional in a hospital or clinic setting. A special MedGuide will be given to you by the pharmacist with each prescription and refill. Be sure to read this information carefully each time. Talk to your pediatrician regarding the use of this medicine in children. This medicine is not approved for use in children. Overdosage: If you think you have taken too much of this medicine contact a poison control center or emergency room at once. NOTE: This medicine is only for you. Do not share this medicine with others. What if I miss a dose? It is important not to miss your dose. Call your doctor or health care professional if you are unable to keep an appointment. What may interact with this medicine? -steroid medicines like prednisone or cortisone -TNF-alpha inhibitors like natalizumab, adalimumab, and infliximab -vaccines This list may not describe  all possible interactions. Give your health care provider a list of all the medicines, herbs, non-prescription drugs, or dietary supplements you use. Also tell them if you smoke, drink alcohol, or use illegal drugs. Some items may interact with your medicine. What should I watch for while using this medicine? Your condition will be monitored carefully while you are receiving this medicine. Visit your doctor for regular check ups. Tell your doctor or healthcare professional if your symptoms do not start to get better or if they get worse. Stay away from people who are sick. Call your doctor or health care professional for advice if you get a fever, chills or sore throat, or other symptoms of a cold or flu. Do not treat yourself. In some patients, this medicine may cause a serious brain infection that may cause death. If you have any problems seeing, thinking, speaking, walking, or standing, tell your doctor right away. If you cannot reach your doctor, get urgent medical care. What side effects may I notice from receiving this medicine? Side effects that you should report to your doctor or health care professional as soon as possible: -allergic reactions like skin rash, itching or hives, swelling of the face, lips, or tongue -breathing problems -changes in vision -chest pain -dark urine -depression, feelings of sadness -dizziness -general ill feeling or flu-like symptoms -irregular, missed, or painful menstrual periods -light-colored stools -loss of appetite, nausea -muscle weakness -problems with balance, talking, or walking -right upper belly pain -unusually weak or tired -yellowing of the eyes or skin Side effects that usually do not require medical attention (report to your doctor or health care professional if they continue or are bothersome): -aches,  pains -headache -stomach upset -tiredness This list may not describe all possible side effects. Call your doctor for medical advice  about side effects. You may report side effects to FDA at 1-800-FDA-1088. Where should I keep my medicine? This drug is given in a hospital or clinic and will not be stored at home. NOTE: This sheet is a summary. It may not cover all possible information. If you have questions about this medicine, talk to your doctor, pharmacist, or health care provider.  2018 Elsevier/Gold Standard (2015-03-26 08:36:51)

## 2017-04-13 NOTE — Telephone Encounter (Signed)
Called both listed phone numbers. No answer. No voicemail.

## 2017-04-19 DIAGNOSIS — M5136 Other intervertebral disc degeneration, lumbar region: Secondary | ICD-10-CM | POA: Diagnosis not present

## 2017-04-19 DIAGNOSIS — M47816 Spondylosis without myelopathy or radiculopathy, lumbar region: Secondary | ICD-10-CM | POA: Diagnosis not present

## 2017-04-19 DIAGNOSIS — G894 Chronic pain syndrome: Secondary | ICD-10-CM | POA: Diagnosis not present

## 2017-04-19 DIAGNOSIS — M545 Low back pain: Secondary | ICD-10-CM | POA: Diagnosis not present

## 2017-05-05 HISTORY — PX: SPINAL CORD STIMULATOR IMPLANT: SHX2422

## 2017-05-10 ENCOUNTER — Other Ambulatory Visit: Payer: Self-pay | Admitting: Gastroenterology

## 2017-05-15 ENCOUNTER — Other Ambulatory Visit: Payer: Self-pay

## 2017-05-15 ENCOUNTER — Observation Stay (HOSPITAL_COMMUNITY): Payer: BLUE CROSS/BLUE SHIELD

## 2017-05-15 ENCOUNTER — Telehealth: Payer: Self-pay | Admitting: Gastroenterology

## 2017-05-15 ENCOUNTER — Encounter: Payer: Self-pay | Admitting: Gastroenterology

## 2017-05-15 ENCOUNTER — Inpatient Hospital Stay (HOSPITAL_COMMUNITY)
Admission: EM | Admit: 2017-05-15 | Discharge: 2017-05-20 | DRG: 193 | Disposition: A | Discharge status: Home / Self Care | Payer: BLUE CROSS/BLUE SHIELD | Attending: Internal Medicine | Admitting: Internal Medicine

## 2017-05-15 ENCOUNTER — Ambulatory Visit (INDEPENDENT_AMBULATORY_CARE_PROVIDER_SITE_OTHER): Payer: BLUE CROSS/BLUE SHIELD | Admitting: Gastroenterology

## 2017-05-15 ENCOUNTER — Encounter (HOSPITAL_COMMUNITY): Payer: Self-pay | Admitting: Emergency Medicine

## 2017-05-15 ENCOUNTER — Ambulatory Visit: Payer: BLUE CROSS/BLUE SHIELD | Admitting: Gastroenterology

## 2017-05-15 VITALS — BP 130/74 | HR 120 | Ht 67.5 in | Wt 157.0 lb

## 2017-05-15 DIAGNOSIS — R1115 Cyclical vomiting syndrome unrelated to migraine: Secondary | ICD-10-CM

## 2017-05-15 DIAGNOSIS — D638 Anemia in other chronic diseases classified elsewhere: Secondary | ICD-10-CM | POA: Diagnosis not present

## 2017-05-15 DIAGNOSIS — G43A1 Cyclical vomiting, intractable: Secondary | ICD-10-CM | POA: Diagnosis present

## 2017-05-15 DIAGNOSIS — K5080 Crohn's disease of both small and large intestine without complications: Secondary | ICD-10-CM

## 2017-05-15 DIAGNOSIS — R935 Abnormal findings on diagnostic imaging of other abdominal regions, including retroperitoneum: Secondary | ICD-10-CM | POA: Diagnosis not present

## 2017-05-15 DIAGNOSIS — F1120 Opioid dependence, uncomplicated: Secondary | ICD-10-CM | POA: Diagnosis not present

## 2017-05-15 DIAGNOSIS — K9089 Other intestinal malabsorption: Secondary | ICD-10-CM

## 2017-05-15 DIAGNOSIS — G894 Chronic pain syndrome: Secondary | ICD-10-CM | POA: Diagnosis not present

## 2017-05-15 DIAGNOSIS — E860 Dehydration: Secondary | ICD-10-CM

## 2017-05-15 DIAGNOSIS — Z9049 Acquired absence of other specified parts of digestive tract: Secondary | ICD-10-CM

## 2017-05-15 DIAGNOSIS — Z960 Presence of urogenital implants: Secondary | ICD-10-CM | POA: Diagnosis present

## 2017-05-15 DIAGNOSIS — Z888 Allergy status to other drugs, medicaments and biological substances status: Secondary | ICD-10-CM

## 2017-05-15 DIAGNOSIS — Z87442 Personal history of urinary calculi: Secondary | ICD-10-CM | POA: Diagnosis not present

## 2017-05-15 DIAGNOSIS — Z6824 Body mass index (BMI) 24.0-24.9, adult: Secondary | ICD-10-CM

## 2017-05-15 DIAGNOSIS — Z79899 Other long term (current) drug therapy: Secondary | ICD-10-CM

## 2017-05-15 DIAGNOSIS — R918 Other nonspecific abnormal finding of lung field: Secondary | ICD-10-CM | POA: Diagnosis not present

## 2017-05-15 DIAGNOSIS — E43 Unspecified severe protein-calorie malnutrition: Secondary | ICD-10-CM | POA: Diagnosis present

## 2017-05-15 DIAGNOSIS — K5090 Crohn's disease, unspecified, without complications: Secondary | ICD-10-CM | POA: Diagnosis not present

## 2017-05-15 DIAGNOSIS — N390 Urinary tract infection, site not specified: Secondary | ICD-10-CM

## 2017-05-15 DIAGNOSIS — Z8719 Personal history of other diseases of the digestive system: Secondary | ICD-10-CM | POA: Diagnosis not present

## 2017-05-15 DIAGNOSIS — R112 Nausea with vomiting, unspecified: Secondary | ICD-10-CM | POA: Diagnosis present

## 2017-05-15 DIAGNOSIS — G43A0 Cyclical vomiting, not intractable: Secondary | ICD-10-CM

## 2017-05-15 DIAGNOSIS — R509 Fever, unspecified: Secondary | ICD-10-CM | POA: Diagnosis not present

## 2017-05-15 DIAGNOSIS — K50012 Crohn's disease of small intestine with intestinal obstruction: Secondary | ICD-10-CM | POA: Diagnosis not present

## 2017-05-15 DIAGNOSIS — K3184 Gastroparesis: Secondary | ICD-10-CM | POA: Diagnosis present

## 2017-05-15 DIAGNOSIS — K8689 Other specified diseases of pancreas: Secondary | ICD-10-CM | POA: Diagnosis not present

## 2017-05-15 DIAGNOSIS — E538 Deficiency of other specified B group vitamins: Secondary | ICD-10-CM | POA: Diagnosis not present

## 2017-05-15 DIAGNOSIS — Z885 Allergy status to narcotic agent status: Secondary | ICD-10-CM

## 2017-05-15 DIAGNOSIS — R110 Nausea: Secondary | ICD-10-CM | POA: Diagnosis not present

## 2017-05-15 DIAGNOSIS — J181 Lobar pneumonia, unspecified organism: Secondary | ICD-10-CM | POA: Diagnosis not present

## 2017-05-15 DIAGNOSIS — F329 Major depressive disorder, single episode, unspecified: Secondary | ICD-10-CM | POA: Diagnosis present

## 2017-05-15 DIAGNOSIS — G8929 Other chronic pain: Secondary | ICD-10-CM | POA: Diagnosis not present

## 2017-05-15 DIAGNOSIS — K219 Gastro-esophageal reflux disease without esophagitis: Secondary | ICD-10-CM | POA: Diagnosis present

## 2017-05-15 DIAGNOSIS — R109 Unspecified abdominal pain: Secondary | ICD-10-CM | POA: Diagnosis not present

## 2017-05-15 DIAGNOSIS — R197 Diarrhea, unspecified: Secondary | ICD-10-CM | POA: Diagnosis not present

## 2017-05-15 DIAGNOSIS — J45909 Unspecified asthma, uncomplicated: Secondary | ICD-10-CM | POA: Diagnosis not present

## 2017-05-15 DIAGNOSIS — K909 Intestinal malabsorption, unspecified: Secondary | ICD-10-CM | POA: Diagnosis not present

## 2017-05-15 DIAGNOSIS — Z7951 Long term (current) use of inhaled steroids: Secondary | ICD-10-CM

## 2017-05-15 DIAGNOSIS — Z86010 Personal history of colonic polyps: Secondary | ICD-10-CM

## 2017-05-15 DIAGNOSIS — Z800 Family history of malignant neoplasm of digestive organs: Secondary | ICD-10-CM

## 2017-05-15 DIAGNOSIS — R634 Abnormal weight loss: Secondary | ICD-10-CM | POA: Diagnosis not present

## 2017-05-15 DIAGNOSIS — N3000 Acute cystitis without hematuria: Secondary | ICD-10-CM | POA: Diagnosis not present

## 2017-05-15 LAB — COMPREHENSIVE METABOLIC PANEL
ALK PHOS: 145 U/L — AB (ref 38–126)
ALT: 23 U/L (ref 14–54)
AST: 32 U/L (ref 15–41)
Albumin: 3.5 g/dL (ref 3.5–5.0)
Anion gap: 11 (ref 5–15)
BILIRUBIN TOTAL: 0.4 mg/dL (ref 0.3–1.2)
BUN: 10 mg/dL (ref 6–20)
CALCIUM: 9.6 mg/dL (ref 8.9–10.3)
CO2: 27 mmol/L (ref 22–32)
CREATININE: 0.63 mg/dL (ref 0.44–1.00)
Chloride: 102 mmol/L (ref 101–111)
Glucose, Bld: 109 mg/dL — ABNORMAL HIGH (ref 65–99)
Potassium: 3.3 mmol/L — ABNORMAL LOW (ref 3.5–5.1)
Sodium: 140 mmol/L (ref 135–145)
Total Protein: 8.4 g/dL — ABNORMAL HIGH (ref 6.5–8.1)

## 2017-05-15 LAB — URINALYSIS, ROUTINE W REFLEX MICROSCOPIC
BILIRUBIN URINE: NEGATIVE
Glucose, UA: NEGATIVE mg/dL
Hgb urine dipstick: NEGATIVE
Ketones, ur: 5 mg/dL — AB
Nitrite: NEGATIVE
PH: 6 (ref 5.0–8.0)
Protein, ur: NEGATIVE mg/dL
SPECIFIC GRAVITY, URINE: 1.015 (ref 1.005–1.030)

## 2017-05-15 LAB — CBC
HCT: 38.4 % (ref 36.0–46.0)
Hemoglobin: 12.4 g/dL (ref 12.0–15.0)
MCH: 26.7 pg (ref 26.0–34.0)
MCHC: 32.3 g/dL (ref 30.0–36.0)
MCV: 82.8 fL (ref 78.0–100.0)
PLATELETS: 373 10*3/uL (ref 150–400)
RBC: 4.64 MIL/uL (ref 3.87–5.11)
RDW: 13.5 % (ref 11.5–15.5)
WBC: 7.1 10*3/uL (ref 4.0–10.5)

## 2017-05-15 LAB — I-STAT BETA HCG BLOOD, ED (MC, WL, AP ONLY): HCG, QUANTITATIVE: 5.9 m[IU]/mL — AB (ref <5)

## 2017-05-15 LAB — LIPASE, BLOOD: Lipase: 27 U/L (ref 11–51)

## 2017-05-15 MED ORDER — ONDANSETRON 4 MG PO TBDP
4.0000 mg | ORAL_TABLET | Freq: Once | ORAL | Status: AC | PRN
  Administered 2017-05-15: 4 mg via ORAL
  Filled 2017-05-15: qty 1

## 2017-05-15 MED ORDER — ONDANSETRON HCL 4 MG/2ML IJ SOLN
4.0000 mg | Freq: Once | INTRAMUSCULAR | Status: AC
  Administered 2017-05-15: 4 mg via INTRAVENOUS
  Filled 2017-05-15: qty 2

## 2017-05-15 MED ORDER — PROMETHAZINE HCL 25 MG/ML IJ SOLN
12.5000 mg | Freq: Once | INTRAMUSCULAR | Status: AC
  Administered 2017-05-15: 12.5 mg via INTRAVENOUS
  Filled 2017-05-15: qty 1

## 2017-05-15 MED ORDER — SODIUM CHLORIDE 0.9 % IV BOLUS (SEPSIS)
2000.0000 mL | Freq: Once | INTRAVENOUS | Status: AC
  Administered 2017-05-15: 2000 mL via INTRAVENOUS

## 2017-05-15 MED ORDER — CEPHALEXIN 500 MG PO CAPS
500.0000 mg | ORAL_CAPSULE | Freq: Once | ORAL | Status: AC
  Administered 2017-05-15: 500 mg via ORAL
  Filled 2017-05-15: qty 1

## 2017-05-15 NOTE — Patient Instructions (Signed)
We will refer you to Dr Milderd Meager at [facility] and contact you with that appointment  We are sending you to the ED , Please go on over after you leave our office  You will need these labs drawn in the ED  CBC CMET  CRP   You will need an Entyvio Trough 24 hours before next infusion   Continue Creon  We will send Colestid to your pharmacy

## 2017-05-15 NOTE — ED Provider Notes (Signed)
[place] [facility] DEPT Provider Note   CSN: [id number] Arrival date & time: 05/15/17  1126     History   Chief Complaint Chief Complaint  Patient presents with  . Abdominal Pain  . Emesis  . Diarrhea    HPI [name] is a 57 y.o. female.  HPI Pt has history of crohn's disease.  Over the past few weeks she has been having trouble with intermittent nausea and vomiting.   Pt has not vomited today.  Pt has had 5 loose stools.  No blood in her stool.  She has not been eating or drinking well.  She has been losing weight.  She is feeling weaker. She went to see her GI doctor today and she was told to come to the ED for IV fluids and further evaluation. Past Medical History:  Diagnosis Date  . Arthritis   . Chronic diarrhea   . Chronic disease anemia   . Crohn's disease ([place])   . Depression   . Family history of malignant neoplasm of gastrointestinal tract   . GERD (gastroesophageal reflux disease)   . H/O hiatal hernia   . History of adenomatous polyp of colon   . History of gastritis    AND HX ILEITIS  . History of kidney stones   . History of small bowel obstruction    SECONDARY CROHN'S STRICTURE  S/P COLECTOMY  . Hyperlipidemia   . Hypertension   . Hypopotassemia   . Internal hemorrhoids   . Kidney stones   . Pancreatic insufficiency   . PONV (postoperative nausea and vomiting)   . Psoriasis    SKIN  . Right ureteral stone   . Rotavirus infection 05/31/2013  . S/P dilatation of esophageal stricture   . Seasonal asthma   . Urgency of urination   . Vitamin B12 deficiency       Past Surgical History:  Procedure Laterality Date  . ANAL FISSURE REPAIR  1990's  . CARPAL TUNNEL RELEASE Right 2000  . CHOLECYSTECTOMY  2002  . CYSTOSCOPY WITH RETROGRADE PYELOGRAM, URETEROSCOPY AND STENT PLACEMENT Left 03/08/2013   Procedure: CYSTOSCOPY WITH RETROGRADE PYELOGRAM, URETEROSCOPY AND STENT PLACEMENT stone basketing;  Surgeon: Alexis Frock,  MD;  Location: [facility];  Service: Urology;  Laterality: Left;  . CYSTOSCOPY WITH RETROGRADE PYELOGRAM, URETEROSCOPY AND STENT PLACEMENT Right 04/03/2013   Procedure: CYSTOSCOPY WITH RETROGRADE PYELOGRAM, URETEROSCOPY AND STENT PLACEMENT;  Surgeon: Alexis Frock, MD;  Location: [facility];  Service: Urology;  Laterality: Right;  . DX LAPAROSCOPY CONVERTED TO OPEN RIGHT COLECCTOMY  09-15-2010   ANASTOMOTIC STRICTURE  . LAPAROSCOPIC ILEOCECECTOMY  10-09-2008   AND APPENDECTOMY (TERMINAL ILEITIS & PARTIAL SBO)  . VAGINAL HYSTERECTOMY  1992    OB History    No data available       Home Medications    Prior to Admission medications   Medication Sig Start Date End Date Taking? Authorizing Provider  albuterol (PROAIR HFA) 108 (90 BASE) MCG/ACT inhaler Inhale 2 puffs into the lungs every 4 (four) hours as needed for wheezing or shortness of breath.     [provider]  budesonide-formoterol (SYMBICORT) 80-4.5 MCG/ACT inhaler Inhale 2 puffs into the lungs daily as needed (SOB, wheezing).     [provider]  cyanocobalamin (,VITAMIN B-12,) 1000 MCG/ML injection Inject 1 mL (1,000 mcg total) into the skin every 30 (thirty) days. 01/04/17   Willia Craze, NP  dicyclomine (BENTYL) 10 MG capsule Take 1 capsule (10 mg total)  by mouth 3 (three) times daily before meals. 03/04/17   Patrecia Pour, MD  econazole nitrate 1 % cream APPLY  CREAM TOPICALLY ONCE DAILY 10/25/16   Mauri Pole, MD  gabapentin (NEURONTIN) 400 MG capsule Take 1 capsule by mouth at lunch, 2 at dinner and 3 at bedtime 08/27/16   [provider]  guaiFENesin-dextromethorphan (ROBITUSSIN DM) 100-10 MG/5ML syrup Take 5 mLs by mouth every 4 (four) hours as needed for cough. 03/04/17   Patrecia Pour, MD  hydrocortisone (CORTEF) 5 MG tablet Take 2 tablets (10 mg total) by mouth daily with breakfast AND 1 tablet (5 mg total) every evening. 03/04/17   Patrecia Pour, MD    lipase/protease/amylase (CREON) 36000 UNITS CPEP capsule 2 capsules (72,000) by mouth with meals and 1 (36,000) with snacks 11/08/16   Nandigam, Venia Minks, MD  metoCLOPramide (REGLAN) 5 MG tablet Take 1 tablet (5 mg total) by mouth 4 (four) times daily. Take 1 tablet before each meal and 1 tablet at bedtime for 2 weeks. 01/04/17   Willia Craze, NP  metoCLOPramide (REGLAN) 5 MG tablet TAKE 1 TABLET BY MOUTH 4 TIMES DAILY. TAKE 1 TABLET BEFORE EACH MEAL AND 1 TABLET AT BEDTIME FOR 2 WEEKS 03/10/17   Mauri Pole, MD  Needles & Syringes MISC 1 Syringe by Does not apply route every 30 (thirty) days. 01/02/15   Mauri Pole, MD  omeprazole (PRILOSEC) 40 MG capsule Take 1 capsule (40 mg total) by mouth daily. 06/30/16   Willia Craze, NP  Oxycodone HCl 10 MG TABS Take 10 mg by mouth See admin instructions. 1 tablet up to five times a day as needed for severe pain.    [provider]  promethazine (PHENERGAN) 25 MG suppository Place 1 suppository (25 mg total) rectally every 6 (six) hours as needed for nausea or vomiting. 09/14/15   Nandigam, Venia Minks, MD  promethazine (PHENERGAN) 25 MG tablet TAKE 1 TABLET BY MOUTH EVERY 6 HOURS AS NEEDED FOR NAUSEA AND VOMITING 05/10/17   Mauri Pole, MD  propranolol ER (INDERAL LA) 80 MG 24 hr capsule Take 1 capsule by mouth daily. 08/22/16   [provider]  tiZANidine (ZANAFLEX) 4 MG tablet Take 4 mg by mouth every 8 (eight) hours as needed for muscle spasms.     [provider]  topiramate (TOPAMAX) 50 MG tablet TAKE ONE TABLET BY MOUTH AT BEDTIME 08/10/16   Jaffe, Adam R, DO  Vedolizumab (ENTYVIO IV) Inject into the vein every 8 (eight) weeks. Receives at [facility].    [provider]  zolmitriptan (ZOMIG) 5 MG nasal solution 1 spray in one nostril.  May repeat 1 spray once after 2 hours if needed. Not to exceed 2 sprays in 24 hours Patient taking differently: Place 1 spray into the nose every 2 (two) hours  as needed for migraine. 1 spray in one nostril.  May repeat 1 spray once after 2 hours if needed. Not to exceed 2 sprays in 24 hours 04/11/16   Pieter Partridge, DO    Family History Family History  Problem Relation Age of Onset  . Colon cancer Mother   . Colon polyps Mother   . Colon polyps Father   . Lung cancer Father   . Breast cancer Paternal Grandmother   . Colon polyps Brother   . Thyroid disease Neg Hx   . Stomach cancer Neg Hx   . Pancreatic cancer Neg Hx  Social History Social History   Tobacco Use  . Smoking status: Former Smoker    Packs/day: 1.00    Years: 10.00    Pack years: 10.00    Types: Cigarettes    Last attempt to quit: 03/07/1984    Years since quitting: 33.2  . Smokeless tobacco: Never Used  Substance Use Topics  . Alcohol use: No    Alcohol/week: 0.0 oz  . Drug use: No     Allergies   Codeine and Humira [adalimumab]   Review of Systems Review of Systems   Physical Exam Updated Vital Signs BP (!) 139/95 (BP Location: Left Arm)   Pulse (!) 119   Temp 99.2 F (37.3 C) (Oral)   Resp 18   SpO2 97%   Physical Exam  Constitutional: She appears well-developed and well-nourished. No distress.  HENT:  Head: Normocephalic and atraumatic.  Right Ear: External ear normal.  Left Ear: External ear normal.  Mm dry  Eyes: Conjunctivae are normal. Right eye exhibits no discharge. Left eye exhibits no discharge. No scleral icterus.  Neck: Neck supple. No tracheal deviation present.  Cardiovascular: Regular rhythm and intact distal pulses. Tachycardia present.  Pulmonary/Chest: Effort normal and breath sounds normal. No stridor. No respiratory distress. She has no wheezes. She has no rales.  Abdominal: Soft. Bowel sounds are normal. She exhibits no distension. There is no tenderness. There is no rebound and no guarding.  Musculoskeletal: She exhibits no edema or tenderness.  Neurological: She is alert. She has normal strength. No cranial nerve deficit  (no facial droop, extraocular movements intact, no slurred speech) or sensory deficit. She exhibits normal muscle tone. She displays no seizure activity. Coordination normal.  Skin: Skin is warm and dry. No rash noted.  Psychiatric: She has a normal mood and affect.  Nursing note and vitals reviewed.    ED Treatments / Results  Labs (all labs ordered are listed, but only abnormal results are displayed) Labs Reviewed  COMPREHENSIVE METABOLIC PANEL - Abnormal; Notable for the following components:      Result Value   Potassium 3.3 (*)    Glucose, Bld 109 (*)    Total Protein 8.4 (*)    Alkaline Phosphatase 145 (*)    All other components within normal limits  I-STAT BETA HCG BLOOD, ED ([facility], [facility], [facility] ONLY) - Abnormal; Notable for the following components:   I-stat hCG, quantitative 5.9 (*)    All other components within normal limits  LIPASE, BLOOD  CBC  URINALYSIS, ROUTINE W REFLEX MICROSCOPIC     Radiology No results found.  Procedures Procedures (including critical care time)  Medications Ordered in ED Medications  ondansetron (ZOFRAN-ODT) disintegrating tablet 4 mg (4 mg Oral Given 05/15/17 1141)     Initial Impression / Assessment and Plan / ED Course  I have reviewed the triage vital signs and the nursing notes.  Pertinent labs & imaging results that were available during my care of the patient were reviewed by me and considered in my medical decision making (see chart for details).  Clinical Course as of May 15 2313  Mon May 15, 2017  1808 HCG is elevated.  Suspect false positive.  Doubt pt is pregnant  [JK]  2138 DIscussed with Dr Carlean Purl.  Would recommend admission for overnight observation.  Continue hydration and antiemetics.  GI will consult on patient in the AM.  [JK]    Clinical Course User Index [JK] Dorie Rank, MD   Patient presented to the emergency room  with complaints of persistent nausea and vomiting without abdominal pain.  Patient's laboratory tests  were notable for possible UTI and mild ketones in her urine.  No signs of severe dehydration.  Patient has failed outpatient therapy.  She has a history of Crohn's disease and gastroparesis.  Discussed case with gastroenterology and the hospitalist.  Patient will be admitted to the hospital for IV hydration, antiemetics and GI consultation in the morning.   Final Clinical Impressions(s) / ED Diagnoses   Final diagnoses:  Cyclical vomiting with nausea, intractability of vomiting not specified  Dehydration  Urinary tract infection without hematuria, site unspecified     Dorie Rank, MD 05/15/17 2316

## 2017-05-15 NOTE — Progress Notes (Signed)
[name]    [id number]    1960/10/18  Primary Care Physician:Cox, Elnita Maxwell, MD  Referring Physician: Rochel Brome, MD [address] [name], [place] [address]  Chief complaint: Nausea, vomiting, weight loss, dizziness HPI: 57 year old female with history of Crohn's disease involving small and large bowel status post terminal ileal resection with ileocolonic anastomosis, pancreatic insufficiency here for follow-up visit.  She was previously maintained on chronic steroids for many years in the past, did not tolerate Remicade infusion 10 mg/kg and also did not tolerate Humira injection, had worsening of psoriasis/ developed maculopapular rash.  She is currently maintained on Entyvio, was started in June 2016 by Dr. Olevia Perches.  Last colonoscopy in 2016 showed focal active ileitis and colitis .  Patient has had chronic nausea, vomiting and diarrhea for many years now .  Diarrhea had transiently improved after she was started on pancreatic replacement , currently on Creon 72,000 units with meals and 36,000 with snacks .  She is vomiting 3-5 times a day, has constant nausea and currently is unable to even tolerate liquids since last Saturday .  According to her husband she did not want to come to ER and wanted to wait until this appointment today .  She is feeling dizzy and weak, has been laying down the whole weekend .  She has 10-15 loose watery bowel movements daily.  Denies any blood .  Phenergan, Zofran or Reglan not helping with her nausea or vomiting. EGD February 2018 showed stomach filled with residual food in the stomach and Candida esophagitis Gastric emptying scan July 2018 showed rapid gastric emptying with 78% emptied at 1 hour and 98% emptied at 2 hours She is on chronic narcotics for pain control.  She was hospitalized end of December with similar complaints and was discharged home after 3 days.  CT abdomen pelvis with contrast March 01, 2017 showed mild  diffuse mucosal enhancement of colon with mild wall thickening of proximal descending colon.  C. difficile was negative.   CT enterography May 2018 showed normal small bowel and colon with no mucosal enhancement or mesenteric changes  Outpatient Encounter Medications as of 05/15/2017  Medication Sig  . albuterol (PROAIR HFA) 108 (90 BASE) MCG/ACT inhaler Inhale 2 puffs into the lungs every 4 (four) hours as needed for wheezing or shortness of breath.   . budesonide-formoterol (SYMBICORT) 80-4.5 MCG/ACT inhaler Inhale 2 puffs into the lungs daily as needed (SOB, wheezing).   . cyanocobalamin (,VITAMIN B-12,) 1000 MCG/ML injection Inject 1 mL (1,000 mcg total) into the skin every 30 (thirty) days.  Marland Kitchen dicyclomine (BENTYL) 10 MG capsule Take 1 capsule (10 mg total) by mouth 3 (three) times daily before meals.  Marland Kitchen econazole nitrate 1 % cream APPLY  CREAM TOPICALLY ONCE DAILY  . gabapentin (NEURONTIN) 400 MG capsule Take 1 capsule by mouth at lunch, 2 at dinner and 3 at bedtime  . guaiFENesin-dextromethorphan (ROBITUSSIN DM) 100-10 MG/5ML syrup Take 5 mLs by mouth every 4 (four) hours as needed for cough.  . hydrocortisone (CORTEF) 5 MG tablet Take 2 tablets (10 mg total) by mouth daily with breakfast AND 1 tablet (5 mg total) every evening.  . lipase/protease/amylase (CREON) 36000 UNITS CPEP capsule 2 capsules (72,000) by mouth with meals and 1 (36,000) with snacks  . metoCLOPramide (REGLAN) 5 MG tablet Take 1 tablet (5 mg total) by mouth 4 (four) times daily. Take 1 tablet before each meal and  1 tablet at bedtime for 2 weeks.  . metoCLOPramide (REGLAN) 5 MG tablet TAKE 1 TABLET BY MOUTH 4 TIMES DAILY. TAKE 1 TABLET BEFORE EACH MEAL AND 1 TABLET AT BEDTIME FOR 2 WEEKS  . Needles & Syringes MISC 1 Syringe by Does not apply route every 30 (thirty) days.  Marland Kitchen omeprazole (PRILOSEC) 40 MG capsule Take 1 capsule (40 mg total) by mouth daily.  . Oxycodone HCl 10 MG TABS Take 10 mg by mouth See admin instructions.  1 tablet up to five times a day as needed for severe pain.  . promethazine (PHENERGAN) 25 MG suppository Place 1 suppository (25 mg total) rectally every 6 (six) hours as needed for nausea or vomiting.  . promethazine (PHENERGAN) 25 MG tablet TAKE 1 TABLET BY MOUTH EVERY 6 HOURS AS NEEDED FOR NAUSEA AND VOMITING  . propranolol ER (INDERAL LA) 80 MG 24 hr capsule Take 1 capsule by mouth daily.  Marland Kitchen tiZANidine (ZANAFLEX) 4 MG tablet Take 4 mg by mouth every 8 (eight) hours as needed for muscle spasms.   Marland Kitchen topiramate (TOPAMAX) 50 MG tablet TAKE ONE TABLET BY MOUTH AT BEDTIME  . Vedolizumab (ENTYVIO IV) Inject into the vein every 8 (eight) weeks. Receives at [facility].  . zolmitriptan (ZOMIG) 5 MG nasal solution 1 spray in one nostril.  May repeat 1 spray once after 2 hours if needed. Not to exceed 2 sprays in 24 hours (Patient taking differently: Place 1 spray into the nose every 2 (two) hours as needed for migraine. 1 spray in one nostril.  May repeat 1 spray once after 2 hours if needed. Not to exceed 2 sprays in 24 hours)  . [DISCONTINUED] hydrocortisone (PROCTOCORT) 1 % CREA Apply 1 application topically daily as needed (pain).    No facility-administered encounter medications on file as of 05/15/2017.     Allergies as of 05/15/2017 - Review Complete 05/15/2017  Allergen Reaction Noted  . Codeine Hives and Nausea And Vomiting 08/31/2007  . Humira [adalimumab] Rash 12/24/2013    Past Medical History:  Diagnosis Date  . Arthritis   . Chronic diarrhea   . Chronic disease anemia   . Crohn's disease ([place])   . Depression   . Family history of malignant neoplasm of gastrointestinal tract   . GERD (gastroesophageal reflux disease)   . H/O hiatal hernia   . History of adenomatous polyp of colon   . History of gastritis    AND HX ILEITIS  . History of kidney stones   . History of small bowel obstruction    SECONDARY CROHN'S STRICTURE  S/P COLECTOMY  . Hyperlipidemia   . Hypertension   .  Hypopotassemia   . Internal hemorrhoids   . Kidney stones   . Pancreatic insufficiency   . PONV (postoperative nausea and vomiting)   . Psoriasis    SKIN  . Right ureteral stone   . Rotavirus infection 05/31/2013  . S/P dilatation of esophageal stricture   . Seasonal asthma   . Urgency of urination   . Vitamin B12 deficiency     Past Surgical History:  Procedure Laterality Date  . ANAL FISSURE REPAIR  1990's  . CARPAL TUNNEL RELEASE Right 2000  . CHOLECYSTECTOMY  2002  . CYSTOSCOPY WITH RETROGRADE PYELOGRAM, URETEROSCOPY AND STENT PLACEMENT Left 03/08/2013   Procedure: CYSTOSCOPY WITH RETROGRADE PYELOGRAM, URETEROSCOPY AND STENT PLACEMENT stone basketing;  Surgeon: Alexis Frock, MD;  Location: [facility];  Service: Urology;  Laterality: Left;  . CYSTOSCOPY WITH RETROGRADE  PYELOGRAM, URETEROSCOPY AND STENT PLACEMENT Right 04/03/2013   Procedure: CYSTOSCOPY WITH RETROGRADE PYELOGRAM, URETEROSCOPY AND STENT PLACEMENT;  Surgeon: Alexis Frock, MD;  Location: [facility];  Service: Urology;  Laterality: Right;  . DX LAPAROSCOPY CONVERTED TO OPEN RIGHT COLECCTOMY  09-15-2010   ANASTOMOTIC STRICTURE  . LAPAROSCOPIC ILEOCECECTOMY  10-09-2008   AND APPENDECTOMY (TERMINAL ILEITIS & PARTIAL SBO)  . VAGINAL HYSTERECTOMY  1992    Family History  Problem Relation Age of Onset  . Colon cancer Mother   . Colon polyps Mother   . Colon polyps Father   . Lung cancer Father   . Breast cancer Paternal Grandmother   . Colon polyps Brother   . Thyroid disease Neg Hx   . Stomach cancer Neg Hx   . Pancreatic cancer Neg Hx     Social History   Socioeconomic History  . Marital status: Married    Spouse name: Not on file  . Number of children: 2  . Years of education: Not on file  . Highest education level: Not on file  Social Needs  . Financial resource strain: Not on file  . Food insecurity - worry: Not on file  . Food insecurity - inability: Not on file  . Transportation  needs - medical: Not on file  . Transportation needs - non-medical: Not on file  Occupational History  . Occupation: unemployed    Fish farm manager: [employer]  Tobacco Use  . Smoking status: Former Smoker    Packs/day: 1.00    Years: 10.00    Pack years: 10.00    Types: Cigarettes    Last attempt to quit: 03/07/1984    Years since quitting: 33.2  . Smokeless tobacco: Never Used  Substance and Sexual Activity  . Alcohol use: No    Alcohol/week: 0.0 oz  . Drug use: No  . Sexual activity: Not on file  Other Topics Concern  . Not on file  Social History Narrative  . Not on file      Review of systems: Review of Systems  Constitutional: Negative for fever and chills. Positive for weakness.  Positive for weight loss HENT: Negative.   Eyes: Negative for blurred vision.  Respiratory: Negative for cough, shortness of breath and wheezing.   Cardiovascular: Negative for chest pain and palpitations.  Gastrointestinal: as per HPI Genitourinary: Negative for dysuria, urgency, frequency and hematuria.  Musculoskeletal: Positive for myalgias, back pain and joint pain.  Skin: Positive for itching and rash.  Neurological: Negative for focal weakness, seizures and loss of consciousness. Positive for dizziness and imbalance Endo/Heme/Allergies: Negative for seasonal allergies.  Psychiatric/Behavioral: Negative for depression, suicidal ideas and hallucinations.  All other systems reviewed and are negative.   Physical Exam: Vitals:   05/15/17 1001  BP: 130/74  Pulse: (!) 120   Body mass index is 24.23 kg/m. Gen:      No acute distress HEENT:  EOMI, sclera anicteric Neck:     No masses; no thyromegaly Lungs:    Clear to auscultation bilaterally; normal respiratory effort CV:         Regular rate and rhythm; no murmurs Abd:      + bowel sounds; midline incisional scar, soft, non-tender; no palpable masses, no distension Ext:    No edema; adequate peripheral perfusion Skin:      Warm and dry;  Psoriasis rash on bilateral elbows and knee Neuro: alert and oriented x 3 Psych: normal mood and affect  Data Reviewed:  Reviewed labs, radiology  imaging, old records and pertinent past GI work up   Assessment and Plan/Recommendations:  57 year old female with history of Crohn's disease with involvement of small bowel and colon status post TI resection, currently maintained on Entyvio, pancreatic insufficiency, and ?  Gastroparesis with complaints of worsening of chronic nausea, vomiting and diarrhea. She is also progressively loosing weight.  She is symptomatic with dizziness and weakness.  Tachycardic with heart rate in the 120s.  Concerning for dehydration with poor p.o. Intake. Will send patient to ER for IV fluids and possible admission CBC, CMP and CRP Stool GI pathogen panel Continue Creon 72,000 units with meals and 36,000 units with snacks Will add Colestid 1 g twice daily to see if it improves her diarrhea Also check Entyvio drug trough and antibody level, she is due for next Entyvio infusion on June 13, 2016 Feel chronic narcotics likely playing a role with exacerbation of nausea, vomiting. ?narcotic-induced gastroparesis.  She has been unable to taper down the dose or stop the medication.  Will refer to [facility]. Derrill Kay at [facility] for further evaluation and management of possible gastroparesis   Greater than 50% of the time used for counseling as well as treatment plan and follow-up. She had multiple questions which were answered to her satisfaction  K. Denzil Magnuson , MD [phone] Mon-Fri 8a-5p [phone] after 5p, weekends, holidays  CC: Rochel Brome, MD

## 2017-05-15 NOTE — Progress Notes (Signed)
   Spoke to Dr. Tomi Bamberger - sounds like at least obs admit for IVF and anti-emetics makes sense given clinical scenario. We wial see her in AM for GI f/u  [name], MD, [facility] 05/15/2017 9:43 PM

## 2017-05-15 NOTE — ED Notes (Signed)
ED TO INPATIENT HANDOFF REPORT  Name/Age/Gender Christophe Louis 57 y.o. female  Code Status Code Status History    Date Active Date Inactive Code Status Order ID Comments User Context   03/01/2017 16:32 03/04/2017 16:45 Full Code [id number]  Phillips Grout, MD Inpatient   07/19/2016 09:37 07/19/2016 20:10 Full Code [id number]  Willia Craze, NP Inpatient   05/24/2016 23:06 06/02/2016 19:57 Full Code [id number]  Karmen Bongo, MD Inpatient   02/20/2016 22:07 02/24/2016 13:53 Full Code [id number]  Sid Falcon, MD Inpatient   05/28/2013 11:19 06/01/2013 16:29 Full Code [id number]  Alfredia Ferguson, PA-C Inpatient      Home/SNF/Other Home  Chief Complaint dehydration  Level of Care/Admitting Diagnosis ED Disposition    ED Disposition Condition [facility] Area: [facility] [[id number]]  Level of Care: Med-Surg [16]  Diagnosis: Nausea & vomiting [947096]  Admitting Physician: Rise Patience [id number]  Attending Physician: Rise Patience [[id number]]  PT Class (Do Not Modify): Observation [104]  PT Acc Code (Do Not Modify): Observation [10022]       Medical History Past Medical History:  Diagnosis Date  . Arthritis   . Chronic diarrhea   . Chronic disease anemia   . Crohn's disease ([place])   . Depression   . Family history of malignant neoplasm of gastrointestinal tract   . GERD (gastroesophageal reflux disease)   . H/O hiatal hernia   . History of adenomatous polyp of colon   . History of gastritis    AND HX ILEITIS  . History of kidney stones   . History of small bowel obstruction    SECONDARY CROHN'S STRICTURE  S/P COLECTOMY  . Hyperlipidemia   . Hypertension   . Hypopotassemia   . Internal hemorrhoids   . Kidney stones   . Pancreatic insufficiency   . PONV (postoperative nausea and vomiting)   . Psoriasis    SKIN  . Right ureteral stone   . Rotavirus infection 05/31/2013  . S/P dilatation of esophageal stricture   .  Seasonal asthma   . Urgency of urination   . Vitamin B12 deficiency     Allergies Allergies  Allergen Reactions  . Codeine Hives and Nausea And Vomiting  . Humira [Adalimumab] Rash    IV Location/Drains/Wounds Patient Lines/Drains/Airways Status   Active Line/Drains/Airways    Name:   Placement date:   Placement time:   Site:   Days:   Peripheral IV 05/15/17 Right Antecubital   05/15/17    1840    Antecubital   less than 1          Labs/Imaging Results for orders placed or performed during the hospital encounter of 05/15/17 (from the past 48 hour(s))  Lipase, blood     Status: None   Collection Time: 05/15/17 11:49 AM  Result Value Ref Range   Lipase 27 11 - 51 U/L    Comment: Performed at [facility], [place] [address], [place], [place] [address]  Comprehensive metabolic panel     Status: Abnormal   Collection Time: 05/15/17 11:49 AM  Result Value Ref Range   Sodium 140 135 - 145 mmol/L   Potassium 3.3 (L) 3.5 - 5.1 mmol/L   Chloride 102 101 - 111 mmol/L   CO2 27 22 - 32 mmol/L   Glucose, Bld 109 (H) 65 - 99 mg/dL   BUN 10 6 - 20 mg/dL   Creatinine, Ser 0.63 0.44 - 1.00 mg/dL  Calcium 9.6 8.9 - 10.3 mg/dL   Total Protein 8.4 (H) 6.5 - 8.1 g/dL   Albumin 3.5 3.5 - 5.0 g/dL   AST 32 15 - 41 U/L   ALT 23 14 - 54 U/L   Alkaline Phosphatase 145 (H) 38 - 126 U/L   Total Bilirubin 0.4 0.3 - 1.2 mg/dL   GFR calc non Af Amer >60 >60 mL/min   GFR calc Af Amer >60 >60 mL/min    Comment: (NOTE) The eGFR has been calculated using the CKD EPI equation. This calculation has not been validated in all clinical situations. eGFR's persistently <60 mL/min signify possible Chronic Kidney Disease.    Anion gap 11 5 - 15    Comment: Performed at [facility], [place] [address], [place], [place] [address]  CBC     Status: None   Collection Time: 05/15/17 11:49 AM  Result Value Ref Range   WBC 7.1 4.0 - 10.5 K/uL   RBC 4.64 3.87 - 5.11 MIL/uL    Hemoglobin 12.4 12.0 - 15.0 g/dL   HCT 38.4 36.0 - 46.0 %   MCV 82.8 78.0 - 100.0 fL   MCH 26.7 26.0 - 34.0 pg   MCHC 32.3 30.0 - 36.0 g/dL   RDW 13.5 11.5 - 15.5 %   Platelets 373 150 - 400 K/uL    Comment: Performed at [facility], [place] [address] [place], [place] [address]  I-Stat beta hCG blood, ED     Status: Abnormal   Collection Time: 05/15/17 11:57 AM  Result Value Ref Range   I-stat hCG, quantitative 5.9 (H) <5 mIU/mL   Comment 3            Comment:   GEST. AGE      CONC.  (mIU/mL)   <=1 WEEK        5 - 50     2 WEEKS       50 - 500     3 WEEKS       100 - 10,000     4 WEEKS     1,000 - 30,000        FEMALE AND NON-PREGNANT FEMALE:     LESS THAN 5 mIU/mL   Urinalysis, Routine w reflex microscopic     Status: Abnormal   Collection Time: 05/15/17  7:50 PM  Result Value Ref Range   Color, Urine YELLOW YELLOW   APPearance HAZY (A) CLEAR   Specific Gravity, Urine 1.015 1.005 - 1.030   pH 6.0 5.0 - 8.0   Glucose, UA NEGATIVE NEGATIVE mg/dL   Hgb urine dipstick NEGATIVE NEGATIVE   Bilirubin Urine NEGATIVE NEGATIVE   Ketones, ur 5 (A) NEGATIVE mg/dL   Protein, ur NEGATIVE NEGATIVE mg/dL   Nitrite NEGATIVE NEGATIVE   Leukocytes, UA SMALL (A) NEGATIVE   RBC / HPF 0-5 0 - 5 RBC/hpf   WBC, UA 6-30 0 - 5 WBC/hpf   Bacteria, UA RARE (A) NONE SEEN   Squamous Epithelial / LPF 0-5 (A) NONE SEEN   Mucus PRESENT    Hyaline Casts, UA PRESENT     Comment: Performed at [facility], [place] [address], [place], [place] [address]   No results found.  Pending Labs Unresulted Labs (From admission, onward)   None      Vitals/Pain Today's Vitals   05/15/17 1930 05/15/17 2000 05/15/17 2030 05/15/17 2100  BP: (!) 153/71 (!) 148/75 (!) 156/84 (!) 146/78  Pulse: (!) 103 Marland Kitchen)  102 (!) 103 (!) 104  Resp:      Temp:      TempSrc:      SpO2: 99% 96% 96% 92%  PainSc:        Isolation Precautions No active isolations  Medications Medications   ondansetron (ZOFRAN-ODT) disintegrating tablet 4 mg (4 mg Oral Given 05/15/17 1141)  sodium chloride 0.9 % bolus 2,000 mL (0 mLs Intravenous Stopped 05/15/17 1915)  ondansetron (ZOFRAN) injection 4 mg (4 mg Intravenous Given 05/15/17 1839)  promethazine (PHENERGAN) injection 12.5 mg (12.5 mg Intravenous Given 05/15/17 2110)  cephALEXin (KEFLEX) capsule 500 mg (500 mg Oral Given 05/15/17 2109)    Mobility walks

## 2017-05-15 NOTE — Telephone Encounter (Signed)
Pt called again. Has been waiting for five hours. She wanted make Beth aware.

## 2017-05-15 NOTE — ED Triage Notes (Signed)
Pt c/o abd pain with n/v/d for 2 weeks. Was seen at PCP today and told to go to ED for dehydration.

## 2017-05-15 NOTE — ED Notes (Signed)
Pt is aware a urine sample is needed, but is unable to provide one at this time as Pt stated she had just voided her bladder. Specimen cup with provided with Pt label.

## 2017-05-15 NOTE — ED Notes (Signed)
Writer informed pt that urine is still needed for sample.

## 2017-05-16 ENCOUNTER — Other Ambulatory Visit: Payer: Self-pay | Admitting: *Deleted

## 2017-05-16 ENCOUNTER — Encounter (HOSPITAL_COMMUNITY): Payer: Self-pay | Admitting: Internal Medicine

## 2017-05-16 ENCOUNTER — Telehealth: Payer: Self-pay | Admitting: *Deleted

## 2017-05-16 DIAGNOSIS — R112 Nausea with vomiting, unspecified: Secondary | ICD-10-CM | POA: Diagnosis not present

## 2017-05-16 DIAGNOSIS — G43A0 Cyclical vomiting, not intractable: Secondary | ICD-10-CM | POA: Diagnosis not present

## 2017-05-16 DIAGNOSIS — Z79899 Other long term (current) drug therapy: Secondary | ICD-10-CM

## 2017-05-16 DIAGNOSIS — K5080 Crohn's disease of both small and large intestine without complications: Secondary | ICD-10-CM

## 2017-05-16 DIAGNOSIS — K909 Intestinal malabsorption, unspecified: Secondary | ICD-10-CM | POA: Diagnosis not present

## 2017-05-16 DIAGNOSIS — E860 Dehydration: Secondary | ICD-10-CM | POA: Diagnosis not present

## 2017-05-16 DIAGNOSIS — R935 Abnormal findings on diagnostic imaging of other abdominal regions, including retroperitoneum: Secondary | ICD-10-CM

## 2017-05-16 DIAGNOSIS — N3000 Acute cystitis without hematuria: Secondary | ICD-10-CM

## 2017-05-16 DIAGNOSIS — K50012 Crohn's disease of small intestine with intestinal obstruction: Secondary | ICD-10-CM | POA: Diagnosis not present

## 2017-05-16 DIAGNOSIS — N390 Urinary tract infection, site not specified: Secondary | ICD-10-CM

## 2017-05-16 LAB — CBC
HCT: 31.9 % — ABNORMAL LOW (ref 36.0–46.0)
Hemoglobin: 9.8 g/dL — ABNORMAL LOW (ref 12.0–15.0)
MCH: 26.3 pg (ref 26.0–34.0)
MCHC: 30.7 g/dL (ref 30.0–36.0)
MCV: 85.5 fL (ref 78.0–100.0)
PLATELETS: 246 10*3/uL (ref 150–400)
RBC: 3.73 MIL/uL — AB (ref 3.87–5.11)
RDW: 13.9 % (ref 11.5–15.5)
WBC: 5.7 10*3/uL (ref 4.0–10.5)

## 2017-05-16 LAB — BASIC METABOLIC PANEL
Anion gap: 12 (ref 5–15)
BUN: 8 mg/dL (ref 6–20)
CHLORIDE: 104 mmol/L (ref 101–111)
CO2: 26 mmol/L (ref 22–32)
Calcium: 8.8 mg/dL — ABNORMAL LOW (ref 8.9–10.3)
Creatinine, Ser: 0.71 mg/dL (ref 0.44–1.00)
GFR calc non Af Amer: >60 mL/min (ref >60)
GLUCOSE: 92 mg/dL (ref 65–99)
Potassium: 3.3 mmol/L — ABNORMAL LOW (ref 3.5–5.1)
Sodium: 142 mmol/L (ref 135–145)

## 2017-05-16 MED ORDER — ENOXAPARIN SODIUM 40 MG/0.4ML ~~LOC~~ SOLN
40.0000 mg | Freq: Every day | SUBCUTANEOUS | Status: DC
  Administered 2017-05-16 – 2017-05-19 (×5): 40 mg via SUBCUTANEOUS
  Filled 2017-05-16 (×5): qty 0.4

## 2017-05-16 MED ORDER — ALBUTEROL SULFATE HFA 108 (90 BASE) MCG/ACT IN AERS: 2.0000 | INHALATION_SPRAY | RESPIRATORY_TRACT | Status: DC | PRN

## 2017-05-16 MED ORDER — METOCLOPRAMIDE HCL 5 MG PO TABS
5.0000 mg | ORAL_TABLET | Freq: Three times a day (TID) | ORAL | Status: DC
  Administered 2017-05-16 – 2017-05-20 (×14): 5 mg via ORAL
  Filled 2017-05-16 (×14): qty 1

## 2017-05-16 MED ORDER — OXYCODONE HCL 5 MG PO TABS
10.0000 mg | ORAL_TABLET | ORAL | Status: DC | PRN
  Administered 2017-05-16 – 2017-05-20 (×17): 10 mg via ORAL
  Filled 2017-05-16 (×18): qty 2

## 2017-05-16 MED ORDER — BOOST / RESOURCE BREEZE PO LIQD CUSTOM
1.0000 | Freq: Three times a day (TID) | ORAL | Status: DC
  Administered 2017-05-16 – 2017-05-17 (×2): 1 via ORAL

## 2017-05-16 MED ORDER — DICYCLOMINE HCL 10 MG PO CAPS
10.0000 mg | ORAL_CAPSULE | Freq: Three times a day (TID) | ORAL | Status: DC
  Administered 2017-05-16 – 2017-05-20 (×14): 10 mg via ORAL
  Filled 2017-05-16 (×16): qty 1

## 2017-05-16 MED ORDER — PROMETHAZINE HCL 25 MG/ML IJ SOLN
12.5000 mg | INTRAMUSCULAR | Status: DC | PRN
  Administered 2017-05-16 – 2017-05-20 (×11): 12.5 mg via INTRAVENOUS
  Filled 2017-05-16 (×11): qty 1

## 2017-05-16 MED ORDER — ACETAMINOPHEN 650 MG RE SUPP
650.0000 mg | Freq: Four times a day (QID) | RECTAL | Status: DC | PRN
  Filled 2017-05-16: qty 1

## 2017-05-16 MED ORDER — SODIUM CHLORIDE 0.9 % IV SOLN
2.0000 g | Freq: Every day | INTRAVENOUS | Status: DC
  Administered 2017-05-16 – 2017-05-17 (×3): 2 g via INTRAVENOUS
  Filled 2017-05-16 (×3): qty 2

## 2017-05-16 MED ORDER — ALBUTEROL SULFATE (2.5 MG/3ML) 0.083% IN NEBU: 2.5000 mg | INHALATION_SOLUTION | RESPIRATORY_TRACT | Status: DC | PRN

## 2017-05-16 MED ORDER — PANTOPRAZOLE SODIUM 40 MG PO TBEC
40.0000 mg | DELAYED_RELEASE_TABLET | Freq: Every day | ORAL | Status: DC
  Administered 2017-05-16 – 2017-05-20 (×5): 40 mg via ORAL
  Filled 2017-05-16 (×5): qty 1

## 2017-05-16 MED ORDER — PROPRANOLOL HCL ER 80 MG PO CP24
80.0000 mg | ORAL_CAPSULE | Freq: Every day | ORAL | Status: DC
  Administered 2017-05-16 – 2017-05-20 (×4): 80 mg via ORAL
  Filled 2017-05-16 (×6): qty 1

## 2017-05-16 MED ORDER — TRAZODONE HCL 50 MG PO TABS
50.0000 mg | ORAL_TABLET | Freq: Every day | ORAL | Status: DC
  Administered 2017-05-16 – 2017-05-19 (×5): 50 mg via ORAL
  Filled 2017-05-16 (×5): qty 1

## 2017-05-16 MED ORDER — TOPIRAMATE 25 MG PO TABS
50.0000 mg | ORAL_TABLET | Freq: Every day | ORAL | Status: DC
  Administered 2017-05-16 – 2017-05-19 (×5): 50 mg via ORAL
  Filled 2017-05-16 (×5): qty 2

## 2017-05-16 MED ORDER — GABAPENTIN 400 MG PO CAPS
1200.0000 mg | ORAL_CAPSULE | Freq: Every day | ORAL | Status: DC
  Administered 2017-05-16 – 2017-05-19 (×5): 1200 mg via ORAL
  Filled 2017-05-16 (×5): qty 3

## 2017-05-16 MED ORDER — POTASSIUM CHLORIDE IN NACL 20-0.9 MEQ/L-% IV SOLN
INTRAVENOUS | Status: AC
  Administered 2017-05-16 (×2): via INTRAVENOUS
  Filled 2017-05-16 (×2): qty 1000

## 2017-05-16 MED ORDER — MOMETASONE FURO-FORMOTEROL FUM 100-5 MCG/ACT IN AERO
2.0000 | INHALATION_SPRAY | Freq: Two times a day (BID) | RESPIRATORY_TRACT | Status: DC
  Administered 2017-05-16 – 2017-05-20 (×9): 2 via RESPIRATORY_TRACT
  Filled 2017-05-16: qty 8.8

## 2017-05-16 MED ORDER — PANCRELIPASE (LIP-PROT-AMYL) 12000-38000 UNITS PO CPEP
36000.0000 [IU] | ORAL_CAPSULE | Freq: Three times a day (TID) | ORAL | Status: DC
  Administered 2017-05-16 – 2017-05-20 (×14): 36000 [IU] via ORAL
  Filled 2017-05-16 (×14): qty 3

## 2017-05-16 MED ORDER — TIZANIDINE HCL 4 MG PO TABS
4.0000 mg | ORAL_TABLET | Freq: Three times a day (TID) | ORAL | Status: DC | PRN
Start: 2017-05-16 — End: 2017-05-20

## 2017-05-16 MED ORDER — POTASSIUM CHLORIDE CRYS ER 20 MEQ PO TBCR
40.0000 meq | EXTENDED_RELEASE_TABLET | Freq: Once | ORAL | Status: AC
  Administered 2017-05-16: 40 meq via ORAL
  Filled 2017-05-16: qty 2

## 2017-05-16 MED ORDER — ACETAMINOPHEN 325 MG PO TABS
650.0000 mg | ORAL_TABLET | Freq: Four times a day (QID) | ORAL | Status: DC | PRN
  Administered 2017-05-17 – 2017-05-18 (×4): 650 mg via ORAL
  Filled 2017-05-16 (×5): qty 2

## 2017-05-16 MED ORDER — ONDANSETRON HCL 4 MG PO TABS
4.0000 mg | ORAL_TABLET | Freq: Three times a day (TID) | ORAL | Status: DC | PRN
  Administered 2017-05-17 – 2017-05-18 (×2): 4 mg via ORAL
  Filled 2017-05-16 (×5): qty 1

## 2017-05-16 NOTE — H&P (Addendum)
History and Physical    ZEHRA RUCCI BUL:[id number] DOB: February 17, 1961 DOA: 05/15/2017  PCP: Rochel Brome, MD  Patient coming from: Home.  Chief Complaint: Nausea vomiting.  HPI: [name] is a 57 y.o. female with history of Crohn's disease and cyclical vomiting was referred to the ER by patient's gastroenterologist after patient has been having persistent vomiting.  Patient has had previous episodes of vomiting and this episode started 3 weeks ago and has not improved.  Patient has been taking Reglan for rectal Phenergan and Zofran despite which not much improvement.  Denies any fever chills.  Has been having diarrhea which is usual for the patient with history of Crohn's.  Patient had gone to her gastroenterologist yesterday and was advised to come to the ER as patient got dehydrated.  ED Course: In the ER acute abdominal series was unremarkable.  Abdomen is nontender.  UA showing possible UTI for which patient was started on ceftriaxone.  Patient admitted for further observation with antiemetics and fluids.  Review of Systems: As per HPI, rest all negative.   Past Medical History:  Diagnosis Date  . Arthritis   . Chronic diarrhea   . Chronic disease anemia   . Crohn's disease ([place])   . Depression   . Family history of malignant neoplasm of gastrointestinal tract   . GERD (gastroesophageal reflux disease)   . H/O hiatal hernia   . History of adenomatous polyp of colon   . History of gastritis    AND HX ILEITIS  . History of kidney stones   . History of small bowel obstruction    SECONDARY CROHN'S STRICTURE  S/P COLECTOMY  . Hyperlipidemia   . Hypertension   . Hypopotassemia   . Internal hemorrhoids   . Kidney stones   . Pancreatic insufficiency   . PONV (postoperative nausea and vomiting)   . Psoriasis    SKIN  . Right ureteral stone   . Rotavirus infection 05/31/2013  . S/P dilatation of esophageal stricture   . Seasonal asthma   . Urgency of urination   .  Vitamin B12 deficiency     Past Surgical History:  Procedure Laterality Date  . ANAL FISSURE REPAIR  1990's  . CARPAL TUNNEL RELEASE Right 2000  . CHOLECYSTECTOMY  2002  . CYSTOSCOPY WITH RETROGRADE PYELOGRAM, URETEROSCOPY AND STENT PLACEMENT Left 03/08/2013   Procedure: CYSTOSCOPY WITH RETROGRADE PYELOGRAM, URETEROSCOPY AND STENT PLACEMENT stone basketing;  Surgeon: Alexis Frock, MD;  Location: [facility];  Service: Urology;  Laterality: Left;  . CYSTOSCOPY WITH RETROGRADE PYELOGRAM, URETEROSCOPY AND STENT PLACEMENT Right 04/03/2013   Procedure: CYSTOSCOPY WITH RETROGRADE PYELOGRAM, URETEROSCOPY AND STENT PLACEMENT;  Surgeon: Alexis Frock, MD;  Location: [facility];  Service: Urology;  Laterality: Right;  . DX LAPAROSCOPY CONVERTED TO OPEN RIGHT COLECCTOMY  09-15-2010   ANASTOMOTIC STRICTURE  . LAPAROSCOPIC ILEOCECECTOMY  10-09-2008   AND APPENDECTOMY (TERMINAL ILEITIS & PARTIAL SBO)  . VAGINAL HYSTERECTOMY  1992     reports that she quit smoking about 33 years ago. Her smoking use included cigarettes. She has a 10.00 pack-year smoking history. she has never used smokeless tobacco. She reports that she does not drink alcohol or use drugs.  Allergies  Allergen Reactions  . Codeine Hives and Nausea And Vomiting  . Humira [Adalimumab] Rash    Family History  Problem Relation Age of Onset  . Colon cancer Mother   . Colon polyps Mother   . Colon polyps Father   .  Lung cancer Father   . Breast cancer Paternal Grandmother   . Colon polyps Brother   . Thyroid disease Neg Hx   . Stomach cancer Neg Hx   . Pancreatic cancer Neg Hx     Prior to Admission medications   Medication Sig Start Date End Date Taking? Authorizing Provider  clobetasol cream (TEMOVATE) 3.41 % Apply 1 application topically 2 (two) times daily.  03/12/17  Yes [provider]  dicyclomine (BENTYL) 10 MG capsule Take 1 capsule (10 mg total) by mouth 3 (three) times daily before meals. 03/04/17   Yes Patrecia Pour, MD  econazole nitrate 1 % cream APPLY  CREAM TOPICALLY ONCE DAILY 10/25/16  Yes Nandigam, Venia Minks, MD  gabapentin (NEURONTIN) 400 MG capsule Take 1 capsule by mouth at lunch, 2 at dinner and 3 at bedtime 08/27/16  Yes [provider]  lipase/protease/amylase (CREON) 36000 UNITS CPEP capsule 2 capsules (72,000) by mouth with meals and 1 (36,000) with snacks 11/08/16  Yes Nandigam, Kavitha V, MD  metoCLOPramide (REGLAN) 5 MG tablet TAKE 1 TABLET BY MOUTH 4 TIMES DAILY. TAKE 1 TABLET BEFORE EACH MEAL AND 1 TABLET AT BEDTIME FOR 2 WEEKS 03/10/17  Yes Nandigam, Venia Minks, MD  omeprazole (PRILOSEC) 40 MG capsule Take 1 capsule (40 mg total) by mouth daily. 06/30/16  Yes Willia Craze, NP  ondansetron (ZOFRAN) 4 MG tablet Take 4 mg by mouth every 8 (eight) hours as needed for nausea or vomiting.  03/24/17  Yes [provider]  Oxycodone HCl 10 MG TABS Take 10 mg by mouth 5 (five) times daily as needed (pain). 1 tablet up to five times a day as needed for severe pain.    Yes [provider]  promethazine (PHENERGAN) 25 MG tablet TAKE 1 TABLET BY MOUTH EVERY 6 HOURS AS NEEDED FOR NAUSEA AND VOMITING 05/10/17  Yes Nandigam, Venia Minks, MD  propranolol ER (INDERAL LA) 80 MG 24 hr capsule Take 1 capsule by mouth daily. 08/22/16  Yes [provider]  tiZANidine (ZANAFLEX) 4 MG tablet Take 4 mg by mouth every 8 (eight) hours as needed for muscle spasms.    Yes [provider]  traZODone (DESYREL) 50 MG tablet Take 50 mg by mouth at bedtime.  03/14/17  Yes [provider]  albuterol (PROAIR HFA) 108 (90 BASE) MCG/ACT inhaler Inhale 2 puffs into the lungs every 4 (four) hours as needed for wheezing or shortness of breath.     [provider]  budesonide-formoterol (SYMBICORT) 80-4.5 MCG/ACT inhaler Inhale 2 puffs into the lungs daily as needed (SOB, wheezing).     [provider]  cyanocobalamin (,VITAMIN B-12,) 1000 MCG/ML injection  Inject 1 mL (1,000 mcg total) into the skin every 30 (thirty) days. 01/04/17   Willia Craze, NP  guaiFENesin-dextromethorphan (ROBITUSSIN DM) 100-10 MG/5ML syrup Take 5 mLs by mouth every 4 (four) hours as needed for cough. Patient not taking: Reported on 05/15/2017 03/04/17   Patrecia Pour, MD  hydrocortisone (CORTEF) 5 MG tablet Take 2 tablets (10 mg total) by mouth daily with breakfast AND 1 tablet (5 mg total) every evening. Patient not taking: No sig reported 03/04/17   Patrecia Pour, MD  metoCLOPramide (REGLAN) 5 MG tablet Take 1 tablet (5 mg total) by mouth 4 (four) times daily. Take 1 tablet before each meal and 1 tablet at bedtime for 2 weeks. Patient not taking: Reported on 05/15/2017 01/04/17   Willia Craze, NP  Needles &  Syringes MISC 1 Syringe by Does not apply route every 30 (thirty) days. 01/02/15   Mauri Pole, MD  promethazine (PHENERGAN) 25 MG suppository Place 1 suppository (25 mg total) rectally every 6 (six) hours as needed for nausea or vomiting. 09/14/15   Nandigam, Venia Minks, MD  topiramate (TOPAMAX) 50 MG tablet TAKE ONE TABLET BY MOUTH AT BEDTIME 08/10/16   Jaffe, Adam R, DO  Vedolizumab (ENTYVIO IV) Inject into the vein every 8 (eight) weeks. Receives at [facility].    [provider]  zolmitriptan (ZOMIG) 5 MG nasal solution 1 spray in one nostril.  May repeat 1 spray once after 2 hours if needed. Not to exceed 2 sprays in 24 hours Patient taking differently: Place 1 spray into the nose every 2 (two) hours as needed for migraine. 1 spray in one nostril.  May repeat 1 spray once after 2 hours if needed. Not to exceed 2 sprays in 24 hours 04/11/16   Pieter Partridge, DO    Physical Exam: Vitals:   05/15/17 2300 05/15/17 2315 05/15/17 2340 05/15/17 2349  BP: 135/60  (!) 146/82   Pulse:  91 88   Resp:   16   Temp:   98.9 F (37.2 C)   TempSrc:   Oral   SpO2:  93% 98%   Weight:    72.3 kg (159 lb 6.3 oz)  Height:    5' 7"  (1.702 m)       Constitutional: Moderately built and nourished. Vitals:   05/15/17 2300 05/15/17 2315 05/15/17 2340 05/15/17 2349  BP: 135/60  (!) 146/82   Pulse:  91 88   Resp:   16   Temp:   98.9 F (37.2 C)   TempSrc:   Oral   SpO2:  93% 98%   Weight:    72.3 kg (159 lb 6.3 oz)  Height:    5' 7"  (1.702 m)   Eyes: Anicteric no pallor. ENMT: No discharge from the ears eyes nose mouth.  Neck: No mass felt.  No neck rigidity. Respiratory: No rhonchi or crepitations. Cardiovascular: S1-S2 heard no murmurs appreciated. Abdomen: Soft nontender bowel sounds present. Musculoskeletal: No edema.  No joint effusion. Skin: No rash.  Skin appears warm. Neurologic: Alert awake oriented to time place and person.  Moves all extremities. Psychiatric: Appears normal.  Normal affect.   Labs on Admission: I have personally reviewed following labs and imaging studies  CBC: Recent Labs  Lab 05/15/17 1149  WBC 7.1  HGB 12.4  HCT 38.4  MCV 82.8  PLT 017   Basic Metabolic Panel: Recent Labs  Lab 05/15/17 1149  NA 140  K 3.3*  CL 102  CO2 27  GLUCOSE 109*  BUN 10  CREATININE 0.63  CALCIUM 9.6   GFR: Estimated Creatinine Clearance: 75.4 mL/min (by C-G formula based on SCr of 0.63 mg/dL). Liver Function Tests: Recent Labs  Lab 05/15/17 1149  AST 32  ALT 23  ALKPHOS 145*  BILITOT 0.4  PROT 8.4*  ALBUMIN 3.5   Recent Labs  Lab 05/15/17 1149  LIPASE 27   No results for input(s): AMMONIA in the last 168 hours. Coagulation Profile: No results for input(s): INR, PROTIME in the last 168 hours. Cardiac Enzymes: No results for input(s): CKTOTAL, CKMB, CKMBINDEX, TROPONINI in the last 168 hours. BNP (last 3 results) No results for input(s): PROBNP in the last 8760 hours. HbA1C: No results for input(s): HGBA1C in the last 72 hours. CBG: No results for  input(s): GLUCAP in the last 168 hours. Lipid Profile: No results for input(s): CHOL, HDL, LDLCALC, TRIG, CHOLHDL, LDLDIRECT in  the last 72 hours. Thyroid Function Tests: No results for input(s): TSH, T4TOTAL, FREET4, T3FREE, THYROIDAB in the last 72 hours. Anemia Panel: No results for input(s): VITAMINB12, FOLATE, FERRITIN, TIBC, IRON, RETICCTPCT in the last 72 hours. Urine analysis:    Component Value Date/Time   COLORURINE YELLOW 05/15/2017 1950   APPEARANCEUR HAZY (A) 05/15/2017 1950   LABSPEC 1.015 05/15/2017 1950   PHURINE 6.0 05/15/2017 1950   GLUCOSEU NEGATIVE 05/15/2017 1950   HGBUR NEGATIVE 05/15/2017 1950   BILIRUBINUR NEGATIVE 05/15/2017 1950   KETONESUR 5 (A) 05/15/2017 1950   PROTEINUR NEGATIVE 05/15/2017 1950   UROBILINOGEN 0.2 03/29/2013 0919   NITRITE NEGATIVE 05/15/2017 1950   LEUKOCYTESUR SMALL (A) 05/15/2017 1950   Sepsis Labs: @LABRCNTIP (procalcitonin:4,lacticidven:4) )No results found for this or any previous visit (from the past 240 hour(s)).   Radiological Exams on Admission: Dg Abd Acute W/chest  Result Date: 05/15/2017 CLINICAL DATA:  Abdominal pain with nausea, vomiting and diarrhea x2 weeks. History of Crohn's disease. EXAM: DG ABDOMEN ACUTE W/ 1V CHEST COMPARISON:  CXR 03/14/2017 FINDINGS: Heart size and mediastinal contours are within normal limits. Both lungs are clear. Cholecystectomy clips are present in the right upper quadrant. Chain sutures are also seen in the right hemiabdomen. Nonobstructed, nondistended bowel gas pattern. No free air is noted. No organomegaly. No radiopaque calculi. Phleboliths are present in the lower pelvis. Unremarkable sacroiliac joints. IMPRESSION: Negative abdominal radiographs.  No acute cardiopulmonary disease. Electronically Signed   By: Ashley Royalty M.D.   On: 05/15/2017 23:38     Assessment/Plan Principal Problem:   Nausea & vomiting  Active Problems:   Crohn's disease ([place])   Urinary tract infection without hematuria    1. Intractable nausea vomiting with history of cyclical vomiting -patient is placed on IV fluids clear liquids and  PRN IV Phenergan.  Continue with Reglan.  On antibiotics for possible UTI which could be contributing to patient's symptoms.  If symptoms does not get better consider CT abdomen and  consult patient's gastroenterologist Dr. Silverio Decamp. 2. Possible UTI on ceftriaxone.  Follow urine cultures. 3. History of Crohn's disease on antibiotic. 4. On Topamax gabapentin and propranolol. 5. History of pancreatitis on Creon.   DVT prophylaxis: Lovenox. Code Status: Full code. Family Communication: Discussed with patient. Disposition Plan: Home. Consults called: None. Admission status: Observation.   Rise Patience MD [facility] Pager [phone].  If 7PM-7AM, please contact night-coverage [email or web address] Password [facility]  05/16/2017, 12:27 AM

## 2017-05-16 NOTE — Progress Notes (Addendum)
Patient ID: [name], female   DOB: April 29, 1960, 57 y.o.   MRN: [id number]    Progress Note   Subjective   Pt was seen in office yesterday by Dr Silverio Decamp and sent to ER for admit . See extensive office note from yesterday -admitted with nausea, and vomiting over past couple weeks , poor intake . Diarrhea is chronic , perhaps a  Little worse.  Since admit IV antiemetics helping, some nausea but no vomiting  Since admit and interestingly no diarrhea !!    Objective   Vital signs in last 24 hours: Temp:  [98.4 F (36.9 C)-99.2 F (37.3 C)] 98.4 F (36.9 C) (03/12 0530) Pulse Rate:  [82-119] 82 (03/12 0530) Resp:  [15-19] 15 (03/12 0530) BP: (128-156)/(60-97) 128/62 (03/12 0530) SpO2:  [92 %-99 %] 96 % (03/12 0530) Weight:  [159 lb 6.3 oz (72.3 kg)] 159 lb 6.3 oz (72.3 kg) (03/11 2349) Last BM Date: 05/15/17 General:   Older  white female in NAD, son in room Heart:  Regular rate and rhythm; no murmurs Lungs: Respirations even and unlabored, lungs CTA bilaterally Abdomen:  Soft, nontender and nondistended. Normal bowel sounds. Extremities:  Without edema. Neurologic:  Alert and oriented,  grossly normal neurologically. Psych:  Cooperative. Normal mood and affect.  Intake/Output from previous day: 03/11 0701 - 03/12 0700 In: 595 [P.O.:120; I.V.:375; IV Piggyback:100] Out: -  Intake/Output this shift: Total I/O In: 240 [P.O.:240] Out: -   Lab Results: Recent Labs    05/15/17 1149 05/16/17 0545  WBC 7.1 5.7  HGB 12.4 9.8*  HCT 38.4 31.9*  PLT 373 246   BMET Recent Labs    05/15/17 1149 05/16/17 0545  NA 140 142  K 3.3* 3.3*  CL 102 104  CO2 27 26  GLUCOSE 109* 92  BUN 10 8  CREATININE 0.63 0.71  CALCIUM 9.6 8.8*   LFT Recent Labs    05/15/17 1149  PROT 8.4*  ALBUMIN 3.5  AST 32  ALT 23  ALKPHOS 145*  BILITOT 0.4   PT/INR No results for input(s): LABPROT, INR in the last 72 hours.  Studies/Results: Dg Abd Acute W/chest  Result Date:  05/15/2017 CLINICAL DATA:  Abdominal pain with nausea, vomiting and diarrhea x2 weeks. History of Crohn's disease. EXAM: DG ABDOMEN ACUTE W/ 1V CHEST COMPARISON:  CXR 03/14/2017 FINDINGS: Heart size and mediastinal contours are within normal limits. Both lungs are clear. Cholecystectomy clips are present in the right upper quadrant. Chain sutures are also seen in the right hemiabdomen. Nonobstructed, nondistended bowel gas pattern. No free air is noted. No organomegaly. No radiopaque calculi. Phleboliths are present in the lower pelvis. Unremarkable sacroiliac joints. IMPRESSION: Negative abdominal radiographs.  No acute cardiopulmonary disease. Electronically Signed   By: Ashley Royalty M.D.   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 and also suspect some sxs secondary to narcotic withdrawal  Precipitated by vomiting /inabiity to keep down pos She is much better already today   Plan ; Full liquid diet  Await urine culture   GI path panel if has diarrhea IV antiemetics scheduled for  now  She has been approved for a spinal stimulator which she says reduced her pain by 65 % with trial - advised to pursue this ASAP - getting off narcotics will help her immensely in the future and simpliffy management  Restart Creon  Today   Contact  Amy Esterwood, P.A.-C               [phone]  Principal Problem:   Nausea & vomiting  Active Problems:   Crohn's disease ([place])   Urinary tract infection without hematuria   LOS: 0 days   Amy Esterwood  05/16/2017, 10:56 AM  GI ATTENDING  History, laboratories, x-rays personally reviewed. Patient  personally seen and examined. Husband in room. Agree with interval progress note as outlined above without additions were deletions.  Docia Chuck. Geri Seminole., M.D. [facility] Division of Gastroenterology

## 2017-05-16 NOTE — Progress Notes (Signed)
PHARMACY NOTE -  ANTIBIOTIC RENAL DOSE ADJUSTMENT   Request received for Pharmacy to assist with antibiotic renal dose adjustment.  Patient has been initiated on Ceftriaxone 2gm iv q24hr for UTI. SCr 0.63, estimated CrCl 75 ml/min Current dosage is appropriate and need for further dosage adjustment appears unlikely at present. Will sign off at this time.  Please reconsult if a change in clinical status warrants re-evaluation of dosage.

## 2017-05-16 NOTE — Progress Notes (Addendum)
Patient admitted earlier today.  H&P reviewed.  Patient seen and examined.  S: Patient has not had any episodes of vomiting since yesterday afternoon.  Still has a lot of nausea.  She has lower abdominal pain which is chronic.  Has not noticed any change to that pain recently.  O: Vital signs reviewed.  They are noted to be stable.  Lungs are clear to auscultation bilaterally.  No wheezing rales rhonchi S1-S2 is normal regular.  No S3 or S4.  No rubs murmurs or bruit Abdomen is soft.  Mildly tender in the lower abdomen without any rebound rigidity or guarding.  No masses organomegaly  Potassium noted to be slightly low.  Hemoglobin noted to be 9.8.  Was 12.4 yesterday.  Patient denies any bleeding.  A/P: 57 year old female with a past medical history of Crohn's disease and possible cyclical vomiting syndrome currently under evaluation by gastroenterology who suspects gastroparesis.  Patient to be referred to gastroparesis clinic at [facility] with worsening nausea vomiting.  UA was noted to be abnormal.  She is thought to have UTI causing current exacerbation.  Continue ceftriaxone for now.  Follow-up on cultures.  It appears one was not sent yesterday.  We will order one although the patient has received IV antibiotics.  Gastroenterology also supposed to see the patient based on notes from last night.  It appears that her symptoms have improved some.  Does not report any diarrhea.  No bleeding.  We will advance her to full liquid.  Potassium will be repleted.  Mobilize.  Drop in hemoglobin is likely dilutional.  No reports of bleeding.  Repeat labs tomorrow.  Rest of the issues as per H&P.  We will continue to follow.  [name] 05/16/2017

## 2017-05-16 NOTE — Progress Notes (Signed)
Initial Nutrition Assessment  DOCUMENTATION CODES:   Severe malnutrition in context of chronic illness  INTERVENTION:    Magic cup BID with meals, each supplement provides 290 kcal and 9 grams of protein  Boost Breeze po BID, each supplement provides 250 kcal and 9 grams of protein  NUTRITION DIAGNOSIS:   Severe Malnutrition related to chronic illness(Crohn's disease) as evidenced by percent weight loss, energy intake < or equal to 75% for > or equal to 1 month.  GOAL:   Patient will meet greater than or equal to 90% of their needs   MONITOR:   PO intake, Labs, Supplement acceptance, Weight trends  REASON FOR ASSESSMENT:   Malnutrition Screening Tool    ASSESSMENT:   Patient with PMH significant for Crohn's disease and cyclical vomiting was referred to the ER by patient's gastroenterologist after noted persistent vomiting x 3 weeks. Pt admitted for intractable nausea/vomiting and dehydration.    Spoke with pt at bedside. Reports having ongoing vomiting upon eating for three weeks PTA. States she would eat bites of soft foods like mashed potatoes, soft rolls, and noodles. Pt not have full meals at this time. With recurring symptoms patient would take Phenergan and Zofran but showed little improvement. Pt previously on clear liquid diet tolerating chicken broth, jello, and boost breeze. Advanced to full liquids this morning. Discussed the importance of protein intake for preservation of lean body mass. Pt willing to try Magic Cup as she typically can tolerate ice cream but no other milk products. RD to order.   Pt endorses a UBW of 200 lb, the last time being at that weight one year ago. Records indicate pt weighed 191 lb 10/25/2016 and 159 lb this admission (16.7% wt loss in 6 months, significant for time frame). Nutrition-Focused physical exam completed.   Medications reviewed and include: creon, IV abx Labs reviewed: K 3.3 (L)   NUTRITION - FOCUSED PHYSICAL EXAM:    Most  Recent Value  Orbital Region  No depletion  Upper Arm Region  No depletion  Thoracic and Lumbar Region  Unable to assess  Buccal Region  No depletion  Temple Region  No depletion  Clavicle Bone Region  No depletion  Clavicle and Acromion Bone Region  No depletion  Scapular Bone Region  Unable to assess  Dorsal Hand  No depletion  Patellar Region  No depletion  Anterior Thigh Region  No depletion  Posterior Calf Region  No depletion  Edema (RD Assessment)  None     Diet Order:  Diet full liquid Room service appropriate? Yes; Fluid consistency: Thin  EDUCATION NEEDS:   Education needs have been addressed  Skin:  Skin Assessment: Reviewed RN Assessment  Last BM:  05/15/17  Height:   Ht Readings from Last 1 Encounters:  05/15/17 5' 7"  (1.702 m)    Weight:   Wt Readings from Last 1 Encounters:  05/15/17 159 lb 6.3 oz (72.3 kg)    Ideal Body Weight:  61.4 kg  BMI:  Body mass index is 24.96 kg/m.  Estimated Nutritional Needs:   Kcal:  1750-1950 kcal/day  Protein:  75-85 g/day  Fluid:  >1.7 L/day    Mariana Single RD, LDN Clinical Nutrition Pager # - [phone]

## 2017-05-16 NOTE — Telephone Encounter (Signed)
I want her seen by Dr Derrill Kay not Dr Earlean Shawl. We can try to send referral to [facility] and see if they can see her sooner. Thanks

## 2017-05-16 NOTE — Telephone Encounter (Signed)
Patients appointment with Dr Earlean Shawl has been cancelled and rescheduled with Dr Milderd Meager on 10/18/2017 at 9:30am  Per Dr Silverio Decamp this is appointment is ok   [facility]  3rd floor   Patient is currently in the hospital will call her to inform when she is out

## 2017-05-16 NOTE — Telephone Encounter (Signed)
FIRST AVAILABLE FOR [facility] IS IN AUGUST WILL SEE ANOTHER PROVIDER FOR SOONER APPOINTMENT    DR NANDIGAM  I CALLED [facility] AND IT SEEMS Emmery HAS MADE AN APPOINTMENT WITH DR MEDOFF AND HAS AN APPOINTMENT WITH HIM ON 05/26/2017.  HE IS ONE OF [facility]'S DOCTORS   THEY NEED TO KNOW WHAT YOU WANT TO DO AS FAR AS HER SEEING DR MEDOFF

## 2017-05-17 DIAGNOSIS — E538 Deficiency of other specified B group vitamins: Secondary | ICD-10-CM | POA: Diagnosis present

## 2017-05-17 DIAGNOSIS — K50012 Crohn's disease of small intestine with intestinal obstruction: Secondary | ICD-10-CM

## 2017-05-17 DIAGNOSIS — R109 Unspecified abdominal pain: Secondary | ICD-10-CM | POA: Diagnosis not present

## 2017-05-17 DIAGNOSIS — Z885 Allergy status to narcotic agent status: Secondary | ICD-10-CM | POA: Diagnosis not present

## 2017-05-17 DIAGNOSIS — K8689 Other specified diseases of pancreas: Secondary | ICD-10-CM | POA: Diagnosis present

## 2017-05-17 DIAGNOSIS — Z86010 Personal history of colonic polyps: Secondary | ICD-10-CM | POA: Diagnosis not present

## 2017-05-17 DIAGNOSIS — Z800 Family history of malignant neoplasm of digestive organs: Secondary | ICD-10-CM | POA: Diagnosis not present

## 2017-05-17 DIAGNOSIS — Z8719 Personal history of other diseases of the digestive system: Secondary | ICD-10-CM | POA: Diagnosis not present

## 2017-05-17 DIAGNOSIS — R935 Abnormal findings on diagnostic imaging of other abdominal regions, including retroperitoneum: Secondary | ICD-10-CM | POA: Diagnosis not present

## 2017-05-17 DIAGNOSIS — G43A0 Cyclical vomiting, not intractable: Secondary | ICD-10-CM | POA: Diagnosis not present

## 2017-05-17 DIAGNOSIS — Z9049 Acquired absence of other specified parts of digestive tract: Secondary | ICD-10-CM | POA: Diagnosis not present

## 2017-05-17 DIAGNOSIS — D638 Anemia in other chronic diseases classified elsewhere: Secondary | ICD-10-CM | POA: Diagnosis present

## 2017-05-17 DIAGNOSIS — Z87442 Personal history of urinary calculi: Secondary | ICD-10-CM | POA: Diagnosis not present

## 2017-05-17 DIAGNOSIS — G43A1 Cyclical vomiting, intractable: Secondary | ICD-10-CM | POA: Diagnosis present

## 2017-05-17 DIAGNOSIS — K219 Gastro-esophageal reflux disease without esophagitis: Secondary | ICD-10-CM | POA: Diagnosis present

## 2017-05-17 DIAGNOSIS — R112 Nausea with vomiting, unspecified: Secondary | ICD-10-CM | POA: Diagnosis present

## 2017-05-17 DIAGNOSIS — N3000 Acute cystitis without hematuria: Secondary | ICD-10-CM | POA: Diagnosis not present

## 2017-05-17 DIAGNOSIS — E860 Dehydration: Secondary | ICD-10-CM

## 2017-05-17 DIAGNOSIS — G8929 Other chronic pain: Secondary | ICD-10-CM

## 2017-05-17 DIAGNOSIS — R197 Diarrhea, unspecified: Secondary | ICD-10-CM | POA: Diagnosis not present

## 2017-05-17 DIAGNOSIS — K3184 Gastroparesis: Secondary | ICD-10-CM | POA: Diagnosis present

## 2017-05-17 DIAGNOSIS — G894 Chronic pain syndrome: Secondary | ICD-10-CM | POA: Diagnosis present

## 2017-05-17 DIAGNOSIS — N390 Urinary tract infection, site not specified: Secondary | ICD-10-CM | POA: Diagnosis present

## 2017-05-17 DIAGNOSIS — R918 Other nonspecific abnormal finding of lung field: Secondary | ICD-10-CM | POA: Diagnosis not present

## 2017-05-17 DIAGNOSIS — J181 Lobar pneumonia, unspecified organism: Secondary | ICD-10-CM | POA: Diagnosis present

## 2017-05-17 DIAGNOSIS — K9089 Other intestinal malabsorption: Secondary | ICD-10-CM | POA: Diagnosis not present

## 2017-05-17 DIAGNOSIS — K5090 Crohn's disease, unspecified, without complications: Secondary | ICD-10-CM | POA: Diagnosis present

## 2017-05-17 DIAGNOSIS — Z960 Presence of urogenital implants: Secondary | ICD-10-CM | POA: Diagnosis present

## 2017-05-17 DIAGNOSIS — E43 Unspecified severe protein-calorie malnutrition: Secondary | ICD-10-CM | POA: Diagnosis present

## 2017-05-17 DIAGNOSIS — F329 Major depressive disorder, single episode, unspecified: Secondary | ICD-10-CM | POA: Diagnosis present

## 2017-05-17 DIAGNOSIS — Z6824 Body mass index (BMI) 24.0-24.9, adult: Secondary | ICD-10-CM | POA: Diagnosis not present

## 2017-05-17 DIAGNOSIS — J45909 Unspecified asthma, uncomplicated: Secondary | ICD-10-CM | POA: Diagnosis present

## 2017-05-17 DIAGNOSIS — F1120 Opioid dependence, uncomplicated: Secondary | ICD-10-CM | POA: Diagnosis present

## 2017-05-17 DIAGNOSIS — K909 Intestinal malabsorption, unspecified: Secondary | ICD-10-CM | POA: Diagnosis not present

## 2017-05-17 LAB — CBC
HCT: 34.7 % — ABNORMAL LOW (ref 36.0–46.0)
Hemoglobin: 10.8 g/dL — ABNORMAL LOW (ref 12.0–15.0)
MCH: 26.3 pg (ref 26.0–34.0)
MCHC: 31.1 g/dL (ref 30.0–36.0)
MCV: 84.4 fL (ref 78.0–100.0)
PLATELETS: 287 10*3/uL (ref 150–400)
RBC: 4.11 MIL/uL (ref 3.87–5.11)
RDW: 13.8 % (ref 11.5–15.5)
WBC: 8.6 10*3/uL (ref 4.0–10.5)

## 2017-05-17 LAB — C DIFFICILE QUICK SCREEN W PCR REFLEX
C DIFFICILE (CDIFF) INTERP: NOT DETECTED
C DIFFICILE (CDIFF) TOXIN: NEGATIVE
C DIFFICLE (CDIFF) ANTIGEN: NEGATIVE

## 2017-05-17 LAB — BASIC METABOLIC PANEL
ANION GAP: 10 (ref 5–15)
BUN: <5 mg/dL — ABNORMAL LOW (ref 6–20)
CALCIUM: 9.3 mg/dL (ref 8.9–10.3)
CO2: 25 mmol/L (ref 22–32)
Chloride: 107 mmol/L (ref 101–111)
Creatinine, Ser: 0.7 mg/dL (ref 0.44–1.00)
GFR calc Af Amer: >60 mL/min (ref >60)
GLUCOSE: 98 mg/dL (ref 65–99)
Potassium: 3.9 mmol/L (ref 3.5–5.1)
Sodium: 142 mmol/L (ref 135–145)

## 2017-05-17 LAB — URINE CULTURE: Culture: <10000 — AB

## 2017-05-17 LAB — MAGNESIUM: Magnesium: 1.5 mg/dL — ABNORMAL LOW (ref 1.7–2.4)

## 2017-05-17 MED ORDER — MAGNESIUM SULFATE 2 GM/50ML IV SOLN
2.0000 g | Freq: Once | INTRAVENOUS | Status: AC
  Administered 2017-05-17: 19:00:00 2 g via INTRAVENOUS
  Filled 2017-05-17: qty 50

## 2017-05-17 MED ORDER — POTASSIUM CHLORIDE IN NACL 20-0.9 MEQ/L-% IV SOLN
INTRAVENOUS | Status: AC
  Administered 2017-05-17 – 2017-05-19 (×3): via INTRAVENOUS
  Filled 2017-05-17 (×3): qty 1000

## 2017-05-17 NOTE — Progress Notes (Addendum)
Patient ID: [name], female   DOB: Sep 18, 1960, 57 y.o.   MRN: [id number]    Progress Note   Subjective   Fever 101 this am, no chills , no other new sxs.  Tolerating liquids, mostly clears  , no vomiting but still nauseated Diarrhea x 3 today which is about her usual  GI path panel ordered  Because of fever Urine culture -not sent prior to starting Abx, BC neg   Objective   Vital signs in last 24 hours: Temp:  [98.4 F (36.9 C)-101.5 F (38.6 C)] 98.9 F (37.2 C) (03/13 1357) Pulse Rate:  [71-90] 71 (03/13 1357) Resp:  [15-17] 17 (03/13 1357) BP: (113-150)/(66-95) 113/66 (03/13 1357) SpO2:  [95 %-98 %] 98 % (03/13 1357) Last BM Date: 05/16/17 General:  Older   white female in NAD Heart:  Regular rate and rhythm; no murmurs Lungs: Respirations even and unlabored, lungs CTA bilaterally Abdomen:  Soft, nontender and nondistended. BS hyperactive Extremities:  Without edema. Neurologic:  Alert and oriented,  grossly normal neurologically. Psych:  Cooperative. Normal mood and affect.  Intake/Output from previous day: 03/12 0701 - 03/13 0700 In: 1401.3 [P.O.:860; I.V.:541.3] Out: -  Intake/Output this shift: Total I/O In: 1200 [P.O.:1200] Out: -   Lab Results: Recent Labs    05/15/17 1149 05/16/17 0545 05/17/17 0547  WBC 7.1 5.7 8.6  HGB 12.4 9.8* 10.8*  HCT 38.4 31.9* 34.7*  PLT 373 246 287   BMET Recent Labs    05/15/17 1149 05/16/17 0545 05/17/17 0547  NA 140 142 142  K 3.3* 3.3* 3.9  CL 102 104 107  CO2 27 26 25   GLUCOSE 109* 92 98  BUN 10 8 <5*  CREATININE 0.63 0.71 0.70  CALCIUM 9.6 8.8* 9.3   LFT Recent Labs    05/15/17 1149  PROT 8.4*  ALBUMIN 3.5  AST 32  ALT 23  ALKPHOS 145*  BILITOT 0.4   PT/INR No results for input(s): LABPROT, INR in the last 72 hours.  Studies/Results: Dg Abd Acute W/chest  Result Date: 05/15/2017 CLINICAL DATA:  Abdominal pain with nausea, vomiting and diarrhea x2 weeks. History of Crohn's disease.  EXAM: DG ABDOMEN ACUTE W/ 1V CHEST COMPARISON:  CXR 03/14/2017 FINDINGS: Heart size and mediastinal contours are within normal limits. Both lungs are clear. Cholecystectomy clips are present in the right upper quadrant. Chain sutures are also seen in the right hemiabdomen. Nonobstructed, nondistended bowel gas pattern. No free air is noted. No organomegaly. No radiopaque calculi. Phleboliths are present in the lower pelvis. Unremarkable sacroiliac joints. IMPRESSION: Negative abdominal radiographs.  No acute cardiopulmonary disease. Electronically Signed   By: Ashley Royalty M.D.   On: 05/15/2017 23:38       Assessment / Plan:    #1 57 yo female with Crohns disease on Entyvio- ileocolitis s/p ileocecal resection. 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, ?component of narc withdrawal Improved but had fever 101 this am  Continue current regimen GI path panel, cdiff PCR  #2 Pancreatic insufficiency- continue Creon #3 Chronic pain -narcotic dependent -primarily secondary to back pain - has been approved for spinal stimulator- hopefully can be placed  soon so she can wean off narcotics  Contact  Amy Esterwood, P.A.-C               [phone]  Principal Problem:   Nausea & vomiting  Active Problems:   Crohn's disease ([place])   Urinary tract infection without hematuria   Abnormal x-ray of abdomen   Nausea and vomiting   LOS: 0 days   Amy Esterwood  05/17/2017, 2:38 PM   GI ATTENDING  Interval history data reviewed. Agree with above interval progress note. Agree with depression and plans as outlined. Not sure she has tried Colestid for her chronic diarrhea. Will check. Continue other supportive measures.   Docia Chuck. Geri Seminole., M.D. [facility] Division of Gastroenterology

## 2017-05-17 NOTE — Plan of Care (Signed)
Plan of care reviewed with patient and family.  Questions answered to patient's satisfaction.

## 2017-05-17 NOTE — Progress Notes (Signed)
PROGRESS NOTE    [name]   FWY:[id number]  DOB: [birth year]  DOA: 05/15/2017 PCP: Rochel Brome, MD   Brief Narrative:  [name] is a 57 y/o with Crohn's disease, chronic pain on narcotics, pancreatic insufficiency with h/o nausea, vomiting and loose stools (she states that loose stools are the norm for her) at home who presents for worsening symtoms over the past 2-3 wks. Her GI doctor advised her to come to the ER.  In ED, Xrays of abdomen unrevealing. Found to have a + UA and started on Ceftriaxone. Given IVF and treated with IV antiemetics   Subjective: Vomiting has resolved, she is drinking liquids now. Diarrhea has improved but she had 2 BMs today which is more or less her norm. No bloody stool.  ROS: no complaints of nausea, vomiting, constipation diarrhea, cough, dyspnea or dysuria. No other complaints.   Assessment & Plan:   Principal Problem:   Nausea & vomiting  - Mild worsening of chronic diarrhea - ? Gastroparesis du to narcotics - improving- cont current management with full liquids, Reglan TID, Bentyl TID AC, Protonix and PRN Phenergan -noted to have a fever of 101 this AM-  send stool sample   Active Problems: Fever/ UTI? - she has no symptoms of a UTI but UA is + - she has no other obvious source for fever other than above GI issues - on Ceftriaxone- U culture not sent prior to antibiotics- blood cx negative - cont to follow    Crohn's disease  - managed with Entyvio by GI  Pancreatic insufficiency - cont pancreatic enzymes  Hypomagnesemia - replace and recheck tomorrow  Chronic pain - approved for spinal cord simulator and hopefully will be able to get narcotics weaned subsequently -cont Oxy IR and Neurontin  DVT prophylaxis: Lovenox Code Status: Full code Family Communication:  Disposition Plan: home when stable Consultants:   GI Procedures:     Antimicrobials:  Anti-infectives (From admission, onward)   Start      Dose/Rate Route Frequency Ordered Stop   05/16/17 0030  cefTRIAXone (ROCEPHIN) 2 g in sodium chloride 0.9 % 100 mL IVPB     2 g 200 mL/hr over 30 Minutes Intravenous Daily at bedtime 05/16/17 0028     05/15/17 2030  cephALEXin (KEFLEX) capsule 500 mg     500 mg Oral  Once 05/15/17 2026 05/15/17 2109       Objective: Vitals:   05/17/17 0733 05/17/17 0735 05/17/17 0838 05/17/17 1357  BP:    113/66  Pulse:    71  Resp:    17  Temp: 98.4 F (36.9 C) 99 F (37.2 C)  98.9 F (37.2 C)  TempSrc: Oral Oral  Oral  SpO2:   97% 98%  Weight:      Height:        Intake/Output Summary (Last 24 hours) at 05/17/2017 1359 Last data filed at 05/17/2017 1357 Gross per 24 hour  Intake 2121.25 ml  Output -  Net 2121.25 ml   Filed Weights   05/15/17 2349  Weight: 72.3 kg (159 lb 6.3 oz)    Examination: General exam: Appears comfortable  HEENT: PERRLA, oral mucosa moist, no sclera icterus or thrush Respiratory system: Clear to auscultation. Respiratory effort normal. Cardiovascular system: S1 & S2 heard, RRR.  No murmurs  Gastrointestinal system: Abdomen soft, non-tender, nondistended. Normal bowel sound. No organomegaly Central nervous system: Alert and oriented. No focal neurological deficits. Extremities: No cyanosis, clubbing or edema Skin: No  rashes or ulcers Psychiatry:  Mood & affect appropriate.     Data Reviewed: I have personally reviewed following labs and imaging studies  CBC: Recent Labs  Lab 05/15/17 1149 05/16/17 0545 05/17/17 0547  WBC 7.1 5.7 8.6  HGB 12.4 9.8* 10.8*  HCT 38.4 31.9* 34.7*  MCV 82.8 85.5 84.4  PLT 373 246 229   Basic Metabolic Panel: Recent Labs  Lab 05/15/17 1149 05/16/17 0545 05/17/17 0547  NA 140 142 142  K 3.3* 3.3* 3.9  CL 102 104 107  CO2 27 26 25   GLUCOSE 109* 92 98  BUN 10 8 <5*  CREATININE 0.63 0.71 0.70  CALCIUM 9.6 8.8* 9.3  MG  --   --  1.5*   GFR: Estimated Creatinine Clearance: 75.4 mL/min (by C-G formula based on  SCr of 0.7 mg/dL). Liver Function Tests: Recent Labs  Lab 05/15/17 1149  AST 32  ALT 23  ALKPHOS 145*  BILITOT 0.4  PROT 8.4*  ALBUMIN 3.5   Recent Labs  Lab 05/15/17 1149  LIPASE 27   No results for input(s): AMMONIA in the last 168 hours. Coagulation Profile: No results for input(s): INR, PROTIME in the last 168 hours. Cardiac Enzymes: No results for input(s): CKTOTAL, CKMB, CKMBINDEX, TROPONINI in the last 168 hours. BNP (last 3 results) No results for input(s): PROBNP in the last 8760 hours. HbA1C: No results for input(s): HGBA1C in the last 72 hours. CBG: No results for input(s): GLUCAP in the last 168 hours. Lipid Profile: No results for input(s): CHOL, HDL, LDLCALC, TRIG, CHOLHDL, LDLDIRECT in the last 72 hours. Thyroid Function Tests: No results for input(s): TSH, T4TOTAL, FREET4, T3FREE, THYROIDAB in the last 72 hours. Anemia Panel: No results for input(s): VITAMINB12, FOLATE, FERRITIN, TIBC, IRON, RETICCTPCT in the last 72 hours. Urine analysis:    Component Value Date/Time   COLORURINE YELLOW 05/15/2017 1950   APPEARANCEUR HAZY (A) 05/15/2017 1950   LABSPEC 1.015 05/15/2017 1950   PHURINE 6.0 05/15/2017 1950   GLUCOSEU NEGATIVE 05/15/2017 1950   HGBUR NEGATIVE 05/15/2017 1950   BILIRUBINUR NEGATIVE 05/15/2017 1950   KETONESUR 5 (A) 05/15/2017 1950   PROTEINUR NEGATIVE 05/15/2017 1950   UROBILINOGEN 0.2 03/29/2013 0919   NITRITE NEGATIVE 05/15/2017 1950   LEUKOCYTESUR SMALL (A) 05/15/2017 1950   Sepsis Labs: @LABRCNTIP (procalcitonin:4,lacticidven:4) )No results found for this or any previous visit (from the past 240 hour(s)).       Radiology Studies: Dg Abd Acute W/chest  Result Date: 05/15/2017 CLINICAL DATA:  Abdominal pain with nausea, vomiting and diarrhea x2 weeks. History of Crohn's disease. EXAM: DG ABDOMEN ACUTE W/ 1V CHEST COMPARISON:  CXR 03/14/2017 FINDINGS: Heart size and mediastinal contours are within normal limits. Both lungs are  clear. Cholecystectomy clips are present in the right upper quadrant. Chain sutures are also seen in the right hemiabdomen. Nonobstructed, nondistended bowel gas pattern. No free air is noted. No organomegaly. No radiopaque calculi. Phleboliths are present in the lower pelvis. Unremarkable sacroiliac joints. IMPRESSION: Negative abdominal radiographs.  No acute cardiopulmonary disease. Electronically Signed   By: Ashley Royalty M.D.   On: 05/15/2017 23:38      Scheduled Meds: . dicyclomine  10 mg Oral TID AC  . enoxaparin (LOVENOX) injection  40 mg Subcutaneous QHS  . gabapentin  1,200 mg Oral QHS  . lipase/protease/amylase  36,000 Units Oral TID AC  . metoCLOPramide  5 mg Oral TID AC  . mometasone-formoterol  2 puff Inhalation BID  . pantoprazole  40 mg Oral  Daily  . propranolol ER  80 mg Oral Daily  . topiramate  50 mg Oral QHS  . traZODone  50 mg Oral QHS   Continuous Infusions: . cefTRIAXone (ROCEPHIN)  IV Stopped (05/16/17 2318)     LOS: 0 days    Time spent in minutes: 35    Debbe Odea, MD [facility] Pager: [email or web address] Password [facility] 05/17/2017, 1:59 PM

## 2017-05-18 ENCOUNTER — Inpatient Hospital Stay (HOSPITAL_COMMUNITY): Payer: BLUE CROSS/BLUE SHIELD

## 2017-05-18 DIAGNOSIS — R935 Abnormal findings on diagnostic imaging of other abdominal regions, including retroperitoneum: Secondary | ICD-10-CM

## 2017-05-18 DIAGNOSIS — J181 Lobar pneumonia, unspecified organism: Principal | ICD-10-CM

## 2017-05-18 DIAGNOSIS — R197 Diarrhea, unspecified: Secondary | ICD-10-CM

## 2017-05-18 DIAGNOSIS — E860 Dehydration: Secondary | ICD-10-CM

## 2017-05-18 DIAGNOSIS — R509 Fever, unspecified: Secondary | ICD-10-CM

## 2017-05-18 LAB — BASIC METABOLIC PANEL
ANION GAP: 8 (ref 5–15)
BUN: <5 mg/dL — ABNORMAL LOW (ref 6–20)
CO2: 24 mmol/L (ref 22–32)
Calcium: 8.3 mg/dL — ABNORMAL LOW (ref 8.9–10.3)
Chloride: 106 mmol/L (ref 101–111)
Creatinine, Ser: 0.72 mg/dL (ref 0.44–1.00)
GFR calc non Af Amer: >60 mL/min (ref >60)
GLUCOSE: 105 mg/dL — AB (ref 65–99)
POTASSIUM: 3.8 mmol/L (ref 3.5–5.1)
Sodium: 138 mmol/L (ref 135–145)

## 2017-05-18 LAB — MAGNESIUM: Magnesium: 1.8 mg/dL (ref 1.7–2.4)

## 2017-05-18 MED ORDER — LEVOFLOXACIN IN D5W 750 MG/150ML IV SOLN
750.0000 mg | INTRAVENOUS | Status: DC
  Administered 2017-05-18 – 2017-05-19 (×2): 750 mg via INTRAVENOUS
  Filled 2017-05-18 (×3): qty 150

## 2017-05-18 MED ORDER — IOPAMIDOL (ISOVUE-300) INJECTION 61%
INTRAVENOUS | Status: AC
  Administered 2017-05-18: 100 mL
  Filled 2017-05-18: qty 100

## 2017-05-18 MED ORDER — IOPAMIDOL (ISOVUE-300) INJECTION 61%: 15.0000 mL | Freq: Two times a day (BID) | INTRAVENOUS | Status: DC | PRN

## 2017-05-18 MED ORDER — IOPAMIDOL (ISOVUE-300) INJECTION 61%
INTRAVENOUS | Status: AC
  Filled 2017-05-18: qty 30

## 2017-05-18 NOTE — Progress Notes (Addendum)
Patient ID: [name], female   DOB: September 11, 1960, 57 y.o.   MRN: [id number]    Progress Note   Subjective    Continues with intermittent fever- 101 last night ,99.9 this am  Remains nauseated, not much appetite, no abdominal pain- diarrhea about same -her usual , 6 x yesterday and 4 x today thus far  Cdiff quick scan neg Urine culture neg Drinking contrast for Ct   Objective   Vital signs in last 24 hours: Temp:  [98.6 F (37 C)-101.3 F (38.5 C)] 99.8 F (37.7 C) (03/14 0600) Pulse Rate:  [68-82] 73 (03/14 1008) Resp:  [17-18] 18 (03/14 0500) BP: (109-152)/(58-68) 109/58 (03/14 1008) SpO2:  [96 %-100 %] 100 % (03/14 1008) Last BM Date: 05/17/17 General:    white female in NAD Heart:  Regular rate and rhythm; no murmurs Lungs: Respirations even and unlabored, lungs CTA bilaterally Abdomen:  Soft, nontender and nondistended. BS hyperactive. Extremities:  Without edema. Neurologic:  Alert and oriented,  grossly normal neurologically. Psych:  Cooperative. Normal mood and affect.  Intake/Output from previous day: 03/13 0701 - 03/14 0700 In: 2196.3 [P.O.:1440; I.V.:556.3; IV Piggyback:200] Out: -  Intake/Output this shift: Total I/O In: 360 [P.O.:360] Out: -   Lab Results: Recent Labs    05/16/17 0545 05/17/17 0547  WBC 5.7 8.6  HGB 9.8* 10.8*  HCT 31.9* 34.7*  PLT 246 287   BMET Recent Labs    05/16/17 0545 05/17/17 0547 05/18/17 0603  NA 142 142 138  K 3.3* 3.9 3.8  CL 104 107 106  CO2 26 25 24   GLUCOSE 92 98 105*  BUN 8 <5* <5*  CREATININE 0.71 0.70 0.72  CALCIUM 8.8* 9.3 8.3*       Assessment / Plan:    #1 57 yo female with Crohns diease on Entyvio- ileocolitis status post previous ileocecal resection, admitted with nausea vomiting and diarrhea (acute on chronic) Symptoms felt to be multifactorial with UTI on admission-treated with Rocephin, final urine culture negative There has been concern for narcotic-induced gastroparesis. Patient  has developed fever since admission, source unclear. Stool for C. Difficile negative, GI pathogen panel pending To haveCT of the abdomen and pelvis today  Question viral gastroenteritis  #2 pancreatic insufficiency--on Creon 3 times daily before meals chronically- have increased dose here  To equal dose she takes chronically #3 chronic pain syndrome-narcotic dependent primarily for low back pain. She is in the process of having a spinal stimulator placed which she felt helped her quite a bit with trial, and hopefully they'll allow her to get off narcotics.  Plan; continue current regimen Full liquid to soft diet as tolerated Follow-up on CT abdomen and pelvis results If Ct negative will start trial of Colestid for diarrhea   Contact  Amy Esterwood, P.A.-C               [phone]  Principal Problem:   Nausea & vomiting  Active Problems:   Crohn's disease ([place])   Urinary tract infection without hematuria   Abnormal x-ray of abdomen   Nausea and vomiting   Dehydration   LOS: 1 day   Amy Esterwood  05/18/2017, 12:56 PM  GI ATTENDING  Interval history and data reviewed. Agree with interval progress note and plans as outlined above. We will continue to follow.  Docia Chuck. Geri Seminole., M.D. [facility] Division of Gastroenterology

## 2017-05-18 NOTE — Progress Notes (Addendum)
PROGRESS NOTE    [name]   NLG:[id number]  DOB: [birth year]  DOA: 05/15/2017 PCP: Rochel Brome, MD   Brief Narrative:  [name] is a 57 y/o with Crohn's disease, chronic pain on narcotics, pancreatic insufficiency with h/o nausea, vomiting and loose stools (she states that loose stools are the norm for her) at home who presents for worsening symtoms over the past 2-3 wks. Her GI doctor advised her to come to the ER.  In ED, Xrays of abdomen unrevealing. Found to have a + UA and started on Ceftriaxone. Given IVF and treated with IV antiemetics   Subjective: Coughed up a small amount of green sputum today. Cough is mild. Abdominal symptoms appear to be resolving. She has some nausea and loose stools. ROS: no complaints of nausea, vomiting, constipation diarrhea, cough, dyspnea or dysuria. No other complaints.   Assessment & Plan:   Principal Problem:   Nausea & vomiting  - Mild worsening of chronic diarrhea - ? Gastroparesis du to narcotics - improving- cont current management with full liquids, Reglan TID, Bentyl TID AC, Protonix and PRN Phenergan -noted to have a fever of 101 again - c diff negative  Active Problems: Fever/ UTI? - she has no symptoms of a UTI but UA is + - she has no other obvious source for fever other than above GI issues - on Ceftriaxone- U culture not sent prior to antibiotics- blood cx negative - cont to follow - 3/14- obtained CT chest today as CXR was unrevealing and she has cough with green sputum- CT reveals a LLL pneumonia and small amount of nodules on right suggestive of infection- see CT below- Rocpehin did no help with resolution of fevers- will d/c and start Levaquin   Crohn's disease  - managed with Entyvio by GI  Pancreatic insufficiency - cont pancreatic enzymes  Hypomagnesemia - replaced  Chronic pain - approved for spinal cord simulator and hopefully will be able to get narcotics weaned subsequently -cont Oxy IR and  Neurontin  DVT prophylaxis: Lovenox Code Status: Full code Family Communication:  Disposition Plan: home when stable Consultants:   GI Procedures:     Antimicrobials:  Anti-infectives (From admission, onward)   Start     Dose/Rate Route Frequency Ordered Stop   05/16/17 0030  cefTRIAXone (ROCEPHIN) 2 g in sodium chloride 0.9 % 100 mL IVPB  Status:  Discontinued     2 g 200 mL/hr over 30 Minutes Intravenous Daily at bedtime 05/16/17 0028 05/18/17 0750   05/15/17 2030  cephALEXin (KEFLEX) capsule 500 mg     500 mg Oral  Once 05/15/17 2026 05/15/17 2109       Objective: Vitals:   05/18/17 0600 05/18/17 0905 05/18/17 1008 05/18/17 1401  BP:   (!) 109/58 132/62  Pulse:   73 77  Resp:    16  Temp: 99.8 F (37.7 C)   (!) 100.4 F (38 C)  TempSrc: Oral   Oral  SpO2:  96% 100% 99%  Weight:      Height:        Intake/Output Summary (Last 24 hours) at 05/18/2017 1526 Last data filed at 05/18/2017 1014 Gross per 24 hour  Intake 1356.25 ml  Output -  Net 1356.25 ml   Filed Weights   05/15/17 2349  Weight: 72.3 kg (159 lb 6.3 oz)    Examination: General exam: Appears comfortable  HEENT: PERRLA, oral mucosa moist, no sclera icterus or thrush Respiratory system: Clear to auscultation. Respiratory  effort normal. Cardiovascular system: S1 & S2 heard, RRR.  No murmurs  Gastrointestinal system: Abdomen soft, non-tender, nondistended. Normal bowel sound. No organomegaly Central nervous system: Alert and oriented. No focal neurological deficits. Extremities: No cyanosis, clubbing or edema Skin: No rashes or ulcers Psychiatry:  Mood & affect appropriate.     Data Reviewed: I have personally reviewed following labs and imaging studies  CBC: Recent Labs  Lab 05/15/17 1149 05/16/17 0545 05/17/17 0547  WBC 7.1 5.7 8.6  HGB 12.4 9.8* 10.8*  HCT 38.4 31.9* 34.7*  MCV 82.8 85.5 84.4  PLT 373 246 945   Basic Metabolic Panel: Recent Labs  Lab 05/15/17 1149  05/16/17 0545 05/17/17 0547 05/18/17 0603  NA 140 142 142 138  K 3.3* 3.3* 3.9 3.8  CL 102 104 107 106  CO2 27 26 25 24   GLUCOSE 109* 92 98 105*  BUN 10 8 <5* <5*  CREATININE 0.63 0.71 0.70 0.72  CALCIUM 9.6 8.8* 9.3 8.3*  MG  --   --  1.5* 1.8   GFR: Estimated Creatinine Clearance: 75.4 mL/min (by C-G formula based on SCr of 0.72 mg/dL). Liver Function Tests: Recent Labs  Lab 05/15/17 1149  AST 32  ALT 23  ALKPHOS 145*  BILITOT 0.4  PROT 8.4*  ALBUMIN 3.5   Recent Labs  Lab 05/15/17 1149  LIPASE 27   No results for input(s): AMMONIA in the last 168 hours. Coagulation Profile: No results for input(s): INR, PROTIME in the last 168 hours. Cardiac Enzymes: No results for input(s): CKTOTAL, CKMB, CKMBINDEX, TROPONINI in the last 168 hours. BNP (last 3 results) No results for input(s): PROBNP in the last 8760 hours. HbA1C: No results for input(s): HGBA1C in the last 72 hours. CBG: No results for input(s): GLUCAP in the last 168 hours. Lipid Profile: No results for input(s): CHOL, HDL, LDLCALC, TRIG, CHOLHDL, LDLDIRECT in the last 72 hours. Thyroid Function Tests: No results for input(s): TSH, T4TOTAL, FREET4, T3FREE, THYROIDAB in the last 72 hours. Anemia Panel: No results for input(s): VITAMINB12, FOLATE, FERRITIN, TIBC, IRON, RETICCTPCT in the last 72 hours. Urine analysis:    Component Value Date/Time   COLORURINE YELLOW 05/15/2017 1950   APPEARANCEUR HAZY (A) 05/15/2017 1950   LABSPEC 1.015 05/15/2017 1950   PHURINE 6.0 05/15/2017 1950   GLUCOSEU NEGATIVE 05/15/2017 1950   HGBUR NEGATIVE 05/15/2017 1950   BILIRUBINUR NEGATIVE 05/15/2017 1950   KETONESUR 5 (A) 05/15/2017 1950   PROTEINUR NEGATIVE 05/15/2017 1950   UROBILINOGEN 0.2 03/29/2013 0919   NITRITE NEGATIVE 05/15/2017 1950   LEUKOCYTESUR SMALL (A) 05/15/2017 1950   Sepsis Labs: @LABRCNTIP (procalcitonin:4,lacticidven:4) ) Recent Results (from the past 240 hour(s))  Culture, Urine     Status:  Abnormal   Collection Time: 05/15/17  7:50 PM  Result Value Ref Range Status   Specimen Description   Final    URINE, RANDOM Performed at [facility], [place] [address] [place], [place] [address]    Special Requests   Final    NONE Performed at [facility], [place] [address], [place], [place] [address]    Culture (A)  Final    <10,000 COLONIES/mL INSIGNIFICANT GROWTH Performed at [place] [address] [place], [place] [address]    Report Status 05/17/2017 FINAL  Final  C difficile quick scan w PCR reflex     Status: None   Collection Time: 05/17/17  5:31 PM  Result Value Ref Range Status   C Diff antigen  NEGATIVE NEGATIVE Final   C Diff toxin NEGATIVE NEGATIVE Final   C Diff interpretation No C. difficile detected.  Final    Comment: Performed at [facility], [place] [address], [place], [place] [address]         Radiology Studies: Ct Chest W Contrast  Result Date: 05/18/2017 CLINICAL DATA:  57 y/o F; history of Crohn's disease and prior ileocecal resection. Admitted with nausea, vomiting, and diarrhea. Fever develops since admission of unknown origin. EXAM: CT CHEST, ABDOMEN, AND PELVIS WITH CONTRAST TECHNIQUE: Multidetector CT imaging of the chest, abdomen and pelvis was performed following the standard protocol during bolus administration of intravenous contrast. CONTRAST:  141m ISOVUE-300 IOPAMIDOL (ISOVUE-300) INJECTION 61% COMPARISON:  03/01/2017 CT abdomen and pelvis. FINDINGS: CT CHEST FINDINGS Cardiovascular: Normal caliber thoracic aorta and main pulmonary artery. Normal heart size. No pericardial effusion. Moderate to severe coronary artery calcification. Mediastinum/Nodes: No enlarged mediastinal, hilar, or axillary lymph nodes. Thyroid gland, trachea, and esophagus demonstrate no significant findings. Lungs/Pleura: Left lower lobe patchy consolidation in bronchovascular distribution compatible with  pneumonia. Few small small clusters of nodules are also present in the right lung likely related to infection. No pleural effusion or pneumothorax. Musculoskeletal: No chest wall mass or suspicious bone lesions identified. CT ABDOMEN PELVIS FINDINGS Hepatobiliary: No focal liver abnormality is seen. Status post cholecystectomy. Mild stable prominence of the biliary system is likely compensatory post cholecystectomy. Pancreas: Unremarkable. No pancreatic ductal dilatation or surrounding inflammatory changes. Spleen: Normal in size without focal abnormality. Adrenals/Urinary Tract: Normal adrenal glands. Left kidney interpolar subcentimeter cyst. No other focal kidney lesion, urinary stone, hydronephrosis. Normal bladder. Stomach/Bowel: Chronic ileocecal postsurgical changes are stable. Mild fatty wall hypertrophy of colon compatible with chronic inflammation. No mucosal enhancement, obstructive change, stricture, or fistula identified. Vascular/Lymphatic: Aortic atherosclerosis. No enlarged abdominal or pelvic lymph nodes. Reproductive: Status post hysterectomy. No adnexal masses. Other: Stable chronic postsurgical changes within the ventral anterior abdominal wall. Tiny left paramedian paraumbilical hernia containing fat. Musculoskeletal: No fracture is seen. 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 IMPRESSION: 1. Patchy left lower lobe consolidation compatible with pneumonia. Small clusters of nodules in right lung are also likely related to infection. 2. Moderate to severe calcific atherosclerosis of coronary arteries. 3. Stable chronic findings of the abdomen and pelvis. Electronically Signed   By: LKristine GarbeM.D.   On: 05/18/2017 14:56   Ct Abdomen Pelvis W Contrast  Result Date: 05/18/2017 CLINICAL DATA:  57y/o F; history of Crohn's disease and prior ileocecal resection. Admitted with nausea, vomiting, and diarrhea. Fever develops since admission of unknown  origin. EXAM: CT CHEST, ABDOMEN, AND PELVIS WITH CONTRAST TECHNIQUE: Multidetector CT imaging of the chest, abdomen and pelvis was performed following the standard protocol during bolus administration of intravenous contrast. CONTRAST:  1027mISOVUE-300 IOPAMIDOL (ISOVUE-300) INJECTION 61% COMPARISON:  03/01/2017 CT abdomen and pelvis. FINDINGS: CT CHEST FINDINGS Cardiovascular: Normal caliber thoracic aorta and main pulmonary artery. Normal heart size. No pericardial effusion. Moderate to severe coronary artery calcification. Mediastinum/Nodes: No enlarged mediastinal, hilar, or axillary lymph nodes. Thyroid gland, trachea, and esophagus demonstrate no significant findings. Lungs/Pleura: Left lower lobe patchy consolidation in bronchovascular distribution compatible with pneumonia. Few small small clusters of nodules are also present in the right lung likely related to infection. No pleural effusion or pneumothorax. Musculoskeletal: No chest wall mass or suspicious bone lesions identified. CT ABDOMEN PELVIS FINDINGS Hepatobiliary: No focal liver abnormality is seen. Status post cholecystectomy. Mild stable prominence of  the biliary system is likely compensatory post cholecystectomy. Pancreas: Unremarkable. No pancreatic ductal dilatation or surrounding inflammatory changes. Spleen: Normal in size without focal abnormality. Adrenals/Urinary Tract: Normal adrenal glands. Left kidney interpolar subcentimeter cyst. No other focal kidney lesion, urinary stone, hydronephrosis. Normal bladder. Stomach/Bowel: Chronic ileocecal postsurgical changes are stable. Mild fatty wall hypertrophy of colon compatible with chronic inflammation. No mucosal enhancement, obstructive change, stricture, or fistula identified. Vascular/Lymphatic: Aortic atherosclerosis. No enlarged abdominal or pelvic lymph nodes. Reproductive: Status post hysterectomy. No adnexal masses. Other: Stable chronic postsurgical changes within the ventral  anterior abdominal wall. Tiny left paramedian paraumbilical hernia containing fat. Musculoskeletal: No fracture is seen. 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 IMPRESSION: 1. Patchy left lower lobe consolidation compatible with pneumonia. Small clusters of nodules in right lung are also likely related to infection. 2. Moderate to severe calcific atherosclerosis of coronary arteries. 3. Stable chronic findings of the abdomen and pelvis. Electronically Signed   By: Kristine Garbe M.D.   On: 05/18/2017 14:56      Scheduled Meds: . dicyclomine  10 mg Oral TID AC  . enoxaparin (LOVENOX) injection  40 mg Subcutaneous QHS  . gabapentin  1,200 mg Oral QHS  . iopamidol      . lipase/protease/amylase  36,000 Units Oral TID AC  . metoCLOPramide  5 mg Oral TID AC  . mometasone-formoterol  2 puff Inhalation BID  . pantoprazole  40 mg Oral Daily  . propranolol ER  80 mg Oral Daily  . topiramate  50 mg Oral QHS  . traZODone  50 mg Oral QHS   Continuous Infusions: . 0.9 % NaCl with KCl 20 mEq / L 75 mL/hr at 05/18/17 0814     LOS: 1 day    Time spent in minutes: 35    Debbe Odea, MD [facility] Pager: [email or web address] Password [facility] 05/18/2017, 3:26 PM

## 2017-05-19 DIAGNOSIS — K909 Intestinal malabsorption, unspecified: Secondary | ICD-10-CM

## 2017-05-19 MED ORDER — COLESTIPOL HCL 1 G PO TABS
1.0000 g | ORAL_TABLET | Freq: Two times a day (BID) | ORAL | Status: DC
  Administered 2017-05-19 – 2017-05-20 (×3): 1 g via ORAL
  Filled 2017-05-19 (×4): qty 1

## 2017-05-19 NOTE — Plan of Care (Signed)
Care plan  

## 2017-05-19 NOTE — Progress Notes (Signed)
PROGRESS NOTE    [name]   TDS:[id number]  DOB: 06-09-1960  DOA: 05/15/2017 PCP: Rochel Brome, MD   Brief Narrative:  [name] is a 57 y/o with Crohn's disease, chronic pain on narcotics, pancreatic insufficiency with h/o nausea, vomiting and loose stools (she states that loose stools are the norm for her) at home who presents for worsening symtoms over the past 2-3 wks. Her GI doctor advised her to come to the ER.  In ED, Xrays of abdomen unrevealing. Found to have a + UA and started on Ceftriaxone. Given IVF and treated with IV antiemetics   Subjective: Nausea, vomiting much better now. Eating better. Still has loose stools and had about 11 yesterday which she states her GI doctors are aware of and is normal for her. Chronic abdominal pain is the same.  Cough better.   Assessment & Plan:   Principal Problem:   Nausea & vomiting  - Mild worsening of chronic diarrhea Chronic abdominal pain and Crohn's disease - ? Gastroparesis due to narcotics - Crohn's disease managed with Entyvio by GI - GI managing - nausea and vomiting improving-  current management  includes soft diet, Reglan TID, Bentyl TID AC, Protonix and PRN Phenergan  - c diff negative - CT abd/pelvis negative  Active Problems: Fever/ UTI?/ pneumonia  - she has no symptoms of a UTI but UA is +  - U culture not sent prior to antibiotics- blood cx negative - 3/14- obtained CT chest today as CXR was unrevealing and she has cough with green sputum- CT reveals a LLL pneumonia and small amount of nodules on right suggestive of infection- CT abd/pelvis unrevealing - see CT scans below - Rocpehin did no help with resolution of fevers- will d/c and start Levaquin - 3/15- fevers lower today- appears to be improving- cont Levaquin today- possible d/c home tomorrow if she continues to improve   Pancreatic insufficiency - cont pancreatic enzymes  Hypomagnesemia - replaced  Chronic pain - approved for  spinal cord simulator and hopefully will be able to get narcotics weaned subsequently -cont Oxy IR and Neurontin  DVT prophylaxis: Lovenox Code Status: Full code Family Communication:  Disposition Plan: home when stable- maybe tomorrow Consultants:   GI Procedures:    none Antimicrobials:  Anti-infectives (From admission, onward)   Start     Dose/Rate Route Frequency Ordered Stop   05/18/17 1700  levofloxacin (LEVAQUIN) IVPB 750 mg     750 mg 100 mL/hr over 90 Minutes Intravenous Every 24 hours 05/18/17 1530     05/16/17 0030  cefTRIAXone (ROCEPHIN) 2 g in sodium chloride 0.9 % 100 mL IVPB  Status:  Discontinued     2 g 200 mL/hr over 30 Minutes Intravenous Daily at bedtime 05/16/17 0028 05/18/17 0750   05/15/17 2030  cephALEXin (KEFLEX) capsule 500 mg     500 mg Oral  Once 05/15/17 2026 05/15/17 2109       Objective: Vitals:   05/19/17 0547 05/19/17 0722 05/19/17 0826 05/19/17 1308  BP: 129/62  122/69 (!) 108/48  Pulse: 85  81 71  Resp: 16   16  Temp: 99.7 F (37.6 C)   98.6 F (37 C)  TempSrc: Oral   Oral  SpO2: 91% 94%  98%  Weight:      Height:        Intake/Output Summary (Last 24 hours) at 05/19/2017 1409 Last data filed at 05/19/2017 1308 Gross per 24 hour  Intake 2320 ml  Output -  Net 2320 ml   Filed Weights   05/15/17 2349  Weight: 72.3 kg (159 lb 6.3 oz)    Examination: General exam: Appears comfortable  HEENT: PERRLA, oral mucosa moist, no sclera icterus or thrush Respiratory system: Clear to auscultation. Respiratory effort normal. Cardiovascular system: S1 & S2 heard, RRR.  No murmurs  Gastrointestinal system: Abdomen soft,  Tender mostly across lower abdomen, nondistended. Normal bowel sound. No organomegaly Central nervous system: Alert and oriented. No focal neurological deficits. Extremities: No cyanosis, clubbing or edema Skin: No rashes or ulcers Psychiatry:  Mood & affect appropriate.     Data Reviewed: I have personally reviewed  following labs and imaging studies  CBC: Recent Labs  Lab 05/15/17 1149 05/16/17 0545 05/17/17 0547  WBC 7.1 5.7 8.6  HGB 12.4 9.8* 10.8*  HCT 38.4 31.9* 34.7*  MCV 82.8 85.5 84.4  PLT 373 246 209   Basic Metabolic Panel: Recent Labs  Lab 05/15/17 1149 05/16/17 0545 05/17/17 0547 05/18/17 0603  NA 140 142 142 138  K 3.3* 3.3* 3.9 3.8  CL 102 104 107 106  CO2 27 26 25 24   GLUCOSE 109* 92 98 105*  BUN 10 8 <5* <5*  CREATININE 0.63 0.71 0.70 0.72  CALCIUM 9.6 8.8* 9.3 8.3*  MG  --   --  1.5* 1.8   GFR: Estimated Creatinine Clearance: 75.4 mL/min (by C-G formula based on SCr of 0.72 mg/dL). Liver Function Tests: Recent Labs  Lab 05/15/17 1149  AST 32  ALT 23  ALKPHOS 145*  BILITOT 0.4  PROT 8.4*  ALBUMIN 3.5   Recent Labs  Lab 05/15/17 1149  LIPASE 27   No results for input(s): AMMONIA in the last 168 hours. Coagulation Profile: No results for input(s): INR, PROTIME in the last 168 hours. Cardiac Enzymes: No results for input(s): CKTOTAL, CKMB, CKMBINDEX, TROPONINI in the last 168 hours. BNP (last 3 results) No results for input(s): PROBNP in the last 8760 hours. HbA1C: No results for input(s): HGBA1C in the last 72 hours. CBG: No results for input(s): GLUCAP in the last 168 hours. Lipid Profile: No results for input(s): CHOL, HDL, LDLCALC, TRIG, CHOLHDL, LDLDIRECT in the last 72 hours. Thyroid Function Tests: No results for input(s): TSH, T4TOTAL, FREET4, T3FREE, THYROIDAB in the last 72 hours. Anemia Panel: No results for input(s): VITAMINB12, FOLATE, FERRITIN, TIBC, IRON, RETICCTPCT in the last 72 hours. Urine analysis:    Component Value Date/Time   COLORURINE YELLOW 05/15/2017 1950   APPEARANCEUR HAZY (A) 05/15/2017 1950   LABSPEC 1.015 05/15/2017 1950   PHURINE 6.0 05/15/2017 1950   GLUCOSEU NEGATIVE 05/15/2017 1950   HGBUR NEGATIVE 05/15/2017 1950   BILIRUBINUR NEGATIVE 05/15/2017 1950   KETONESUR 5 (A) 05/15/2017 1950   PROTEINUR  NEGATIVE 05/15/2017 1950   UROBILINOGEN 0.2 03/29/2013 0919   NITRITE NEGATIVE 05/15/2017 1950   LEUKOCYTESUR SMALL (A) 05/15/2017 1950   Sepsis Labs: @LABRCNTIP (procalcitonin:4,lacticidven:4) ) Recent Results (from the past 240 hour(s))  Culture, Urine     Status: Abnormal   Collection Time: 05/15/17  7:50 PM  Result Value Ref Range Status   Specimen Description   Final    URINE, RANDOM Performed at [facility], [place] [address] [place], [place] [address]    Special Requests   Final    NONE Performed at [facility], [place] [address], [place], [place] [address]    Culture (A)  Final    <10,000 COLONIES/mL INSIGNIFICANT GROWTH Performed at [facility], [place] [address] [place], [place] [address]    Report Status 05/17/2017 FINAL  Final  Culture, blood (Routine X 2) w Reflex to ID Panel     Status: None (Preliminary result)   Collection Time: 05/17/17  7:58 AM  Result Value Ref Range Status   Specimen Description   Final    BLOOD RIGHT ANTECUBITAL Performed at [facility] [address], [place], [place] [address]    Special Requests   Final    BOTTLES DRAWN AEROBIC AND ANAEROBIC Blood Culture adequate volume Performed at [facility] [address], [place], [place] [address]    Culture   Final    NO GROWTH 2 DAYS Performed at [facility] [address] [place], [place] [address]    Report Status PENDING  Incomplete  Culture, blood (Routine X 2) w Reflex to ID Panel     Status: None (Preliminary result)   Collection Time: 05/17/17  8:05 AM  Result Value Ref Range Status   Specimen Description   Final    BLOOD LEFT ARM Performed at [facility] [address], [place], [place] [address]    Special Requests   Final    BOTTLES DRAWN AEROBIC AND ANAEROBIC Blood Culture adequate volume Performed at [facility] [address]  [place], [place] [address]    Culture   Final    NO GROWTH 2 DAYS Performed at [facility] [address], [place], [place] [address]    Report Status PENDING  Incomplete  C difficile quick scan w PCR reflex     Status: None   Collection Time: 05/17/17  5:31 PM  Result Value Ref Range Status   C Diff antigen NEGATIVE NEGATIVE Final   C Diff toxin NEGATIVE NEGATIVE Final   C Diff interpretation No C. difficile detected.  Final    Comment: Performed at [facility], [place] [address] [place],  [address]         Radiology Studies: Ct Chest W Contrast  Result Date: 05/18/2017 CLINICAL DATA:  57 y/o F; history of Crohn's disease and prior ileocecal resection. Admitted with nausea, vomiting, and diarrhea. Fever develops since admission of unknown origin. EXAM: CT CHEST, ABDOMEN, AND PELVIS WITH CONTRAST TECHNIQUE: Multidetector CT imaging of the chest, abdomen and pelvis was performed following the standard protocol during bolus administration of intravenous contrast. CONTRAST:  172m ISOVUE-300 IOPAMIDOL (ISOVUE-300) INJECTION 61% COMPARISON:  03/01/2017 CT abdomen and pelvis. FINDINGS: CT CHEST FINDINGS Cardiovascular: Normal caliber thoracic aorta and main pulmonary artery. Normal heart size. No pericardial effusion. Moderate to severe coronary artery calcification. Mediastinum/Nodes: No enlarged mediastinal, hilar, or axillary lymph nodes. Thyroid gland, trachea, and esophagus demonstrate no significant findings. Lungs/Pleura: Left lower lobe patchy consolidation in bronchovascular distribution compatible with pneumonia. Few small small clusters of nodules are also present in the right lung likely related to infection. No pleural effusion or pneumothorax. Musculoskeletal: No chest wall mass or suspicious bone lesions identified. CT ABDOMEN PELVIS FINDINGS Hepatobiliary: No focal liver abnormality is seen. Status post cholecystectomy. Mild stable prominence of the  biliary system is likely compensatory post cholecystectomy. Pancreas: Unremarkable. No pancreatic ductal dilatation or surrounding inflammatory changes. Spleen: Normal in size without focal abnormality. Adrenals/Urinary Tract: Normal adrenal glands. Left kidney interpolar subcentimeter cyst. No other focal kidney lesion, urinary stone, hydronephrosis. Normal bladder. Stomach/Bowel: Chronic ileocecal postsurgical changes are stable. Mild fatty wall hypertrophy of colon  compatible with chronic inflammation. No mucosal enhancement, obstructive change, stricture, or fistula identified. Vascular/Lymphatic: Aortic atherosclerosis. No enlarged abdominal or pelvic lymph nodes. Reproductive: Status post hysterectomy. No adnexal masses. Other: Stable chronic postsurgical changes within the ventral anterior abdominal wall. Tiny left paramedian paraumbilical hernia containing fat. Musculoskeletal: No fracture is seen. 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 IMPRESSION: 1. Patchy left lower lobe consolidation compatible with pneumonia. Small clusters of nodules in right lung are also likely related to infection. 2. Moderate to severe calcific atherosclerosis of coronary arteries. 3. Stable chronic findings of the abdomen and pelvis. Electronically Signed   By: Kristine Garbe M.D.   On: 05/18/2017 14:56   Ct Abdomen Pelvis W Contrast  Result Date: 05/18/2017 CLINICAL DATA:  57 y/o F; history of Crohn's disease and prior ileocecal resection. Admitted with nausea, vomiting, and diarrhea. Fever develops since admission of unknown origin. EXAM: CT CHEST, ABDOMEN, AND PELVIS WITH CONTRAST TECHNIQUE: Multidetector CT imaging of the chest, abdomen and pelvis was performed following the standard protocol during bolus administration of intravenous contrast. CONTRAST:  181m ISOVUE-300 IOPAMIDOL (ISOVUE-300) INJECTION 61% COMPARISON:  03/01/2017 CT abdomen and pelvis. FINDINGS: CT CHEST  FINDINGS Cardiovascular: Normal caliber thoracic aorta and main pulmonary artery. Normal heart size. No pericardial effusion. Moderate to severe coronary artery calcification. Mediastinum/Nodes: No enlarged mediastinal, hilar, or axillary lymph nodes. Thyroid gland, trachea, and esophagus demonstrate no significant findings. Lungs/Pleura: Left lower lobe patchy consolidation in bronchovascular distribution compatible with pneumonia. Few small small clusters of nodules are also present in the right lung likely related to infection. No pleural effusion or pneumothorax. Musculoskeletal: No chest wall mass or suspicious bone lesions identified. CT ABDOMEN PELVIS FINDINGS Hepatobiliary: No focal liver abnormality is seen. Status post cholecystectomy. Mild stable prominence of the biliary system is likely compensatory post cholecystectomy. Pancreas: Unremarkable. No pancreatic ductal dilatation or surrounding inflammatory changes. Spleen: Normal in size without focal abnormality. Adrenals/Urinary Tract: Normal adrenal glands. Left kidney interpolar subcentimeter cyst. No other focal kidney lesion, urinary stone, hydronephrosis. Normal bladder. Stomach/Bowel: Chronic ileocecal postsurgical changes are stable. Mild fatty wall hypertrophy of colon compatible with chronic inflammation. No mucosal enhancement, obstructive change, stricture, or fistula identified. Vascular/Lymphatic: Aortic atherosclerosis. No enlarged abdominal or pelvic lymph nodes. Reproductive: Status post hysterectomy. No adnexal masses. Other: Stable chronic postsurgical changes within the ventral anterior abdominal wall. Tiny left paramedian paraumbilical hernia containing fat. Musculoskeletal: No fracture is seen. 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 IMPRESSION: 1. Patchy left lower lobe consolidation compatible with pneumonia. Small clusters of nodules in right lung are also likely related to infection. 2.  Moderate to severe calcific atherosclerosis of coronary arteries. 3. Stable chronic findings of the abdomen and pelvis. Electronically Signed   By: LKristine GarbeM.D.   On: 05/18/2017 14:56      Scheduled Meds: . colestipol  1 g Oral BID  . dicyclomine  10 mg Oral TID AC  . enoxaparin (LOVENOX) injection  40 mg Subcutaneous QHS  . gabapentin  1,200 mg Oral QHS  . lipase/protease/amylase  36,000 Units Oral TID AC  . metoCLOPramide  5 mg Oral TID AC  . mometasone-formoterol  2 puff Inhalation BID  . pantoprazole  40 mg Oral Daily  . propranolol ER  80 mg Oral Daily  . topiramate  50 mg Oral QHS  . traZODone  50 mg Oral QHS   Continuous Infusions: . 0.9 % NaCl with KCl 20 mEq / L 75 mL/hr  at 05/19/17 1000  . levofloxacin (LEVAQUIN) IV Stopped (05/18/17 1902)     LOS: 2 days    Time spent in minutes: 35    Debbe Odea, MD [facility] Pager: [email or web address] Password [email or web address] 05/19/2017, 2:09 PM

## 2017-05-19 NOTE — Progress Notes (Addendum)
Patient ID: [name], female   DOB: November 19, 1960, 57 y.o.   MRN: [id number]     Progress Note   Subjective   Continues with lower grade fever, 100.4 last evening CT of chest abdomen and pelvis completed yesterday afternoon-no acute findings in the abdomen-CT of the chest showed a left lower lobe pneumonia.  Blood cultures pending Now on Levaquin IV  In better spirits today Patient feeling significantly better today, and has an appetite for the first time. Denies any nausea or vomiting overnight. Diarrhea continues at her usual, has had 4 bowel movements today. No complaints of abdominal pain no cough or shortness of breath   Objective   Vital signs in last 24 hours: Temp:  [99.7 F (37.6 C)-100.4 F (38 C)] 99.7 F (37.6 C) (03/15 0547) Pulse Rate:  [73-85] 81 (03/15 0826) Resp:  [16-17] 16 (03/15 0547) BP: (109-134)/(58-69) 122/69 (03/15 0826) SpO2:  [91 %-100 %] 94 % (03/15 0722) Last BM Date: 05/17/17 General:   older white female in NAD Heart:  Regular rate and rhythm; no murmurs Lungs: Respirations even and unlabored, lungs clear anteriorly Abdomen:  Soft, nontender and nondistended. Normal bowel sounds. Extremities:  Without edema. Neurologic:  Alert and oriented,  grossly normal neurologically. Psych:  Cooperative. Normal mood and affect.  Intake/Output from previous day: 03/14 0701 - 03/15 0700 In: 3115 [P.O.:1240; I.V.:1725; IV Piggyback:150] Out: -  Intake/Output this shift: Total I/O In: 240 [P.O.:240] Out: -   Lab Results: Recent Labs    05/17/17 0547  WBC 8.6  HGB 10.8*  HCT 34.7*  PLT 287   BMET Recent Labs    05/17/17 0547 05/18/17 0603  NA 142 138  K 3.9 3.8  CL 107 106  CO2 25 24  GLUCOSE 98 105*  BUN <5* <5*  CREATININE 0.70 0.72  CALCIUM 9.3 8.3*   LFT No results for input(s): PROT, ALBUMIN, AST, ALT, ALKPHOS, BILITOT, BILIDIR, IBILI in the last 72 hours. PT/INR No results for input(s): LABPROT, INR in the last 72  hours.  Studies/Results: Ct Chest W Contrast  Result Date: 05/18/2017 CLINICAL DATA:  57 y/o F; history of Crohn's disease and prior ileocecal resection. Admitted with nausea, vomiting, and diarrhea. Fever develops since admission of unknown origin. EXAM: CT CHEST, ABDOMEN, AND PELVIS WITH CONTRAST TECHNIQUE: Multidetector CT imaging of the chest, abdomen and pelvis was performed following the standard protocol during bolus administration of intravenous contrast. CONTRAST:  188m ISOVUE-300 IOPAMIDOL (ISOVUE-300) INJECTION 61% COMPARISON:  03/01/2017 CT abdomen and pelvis. FINDINGS: CT CHEST FINDINGS Cardiovascular: Normal caliber thoracic aorta and main pulmonary artery. Normal heart size. No pericardial effusion. Moderate to severe coronary artery calcification. Mediastinum/Nodes: No enlarged mediastinal, hilar, or axillary lymph nodes. Thyroid gland, trachea, and esophagus demonstrate no significant findings. Lungs/Pleura: Left lower lobe patchy consolidation in bronchovascular distribution compatible with pneumonia. Few small small clusters of nodules are also present in the right lung likely related to infection. No pleural effusion or pneumothorax. Musculoskeletal: No chest wall mass or suspicious bone lesions identified. CT ABDOMEN PELVIS FINDINGS Hepatobiliary: No focal liver abnormality is seen. Status post cholecystectomy. Mild stable prominence of the biliary system is likely compensatory post cholecystectomy. Pancreas: Unremarkable. No pancreatic ductal dilatation or surrounding inflammatory changes. Spleen: Normal in size without focal abnormality. Adrenals/Urinary Tract: Normal adrenal glands. Left kidney interpolar subcentimeter cyst. No other focal kidney lesion, urinary stone, hydronephrosis. Normal bladder. Stomach/Bowel: Chronic ileocecal postsurgical changes are stable. Mild fatty wall hypertrophy of colon compatible with chronic inflammation.  No mucosal enhancement, obstructive change,  stricture, or fistula identified. Vascular/Lymphatic: Aortic atherosclerosis. No enlarged abdominal or pelvic lymph nodes. Reproductive: Status post hysterectomy. No adnexal masses. Other: Stable chronic postsurgical changes within the ventral anterior abdominal wall. Tiny left paramedian paraumbilical hernia containing fat. Musculoskeletal: No fracture is seen. 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 IMPRESSION: 1. Patchy left lower lobe consolidation compatible with pneumonia. Small clusters of nodules in right lung are also likely related to infection. 2. Moderate to severe calcific atherosclerosis of coronary arteries. 3. Stable chronic findings of the abdomen and pelvis. Electronically Signed   By: Kristine Garbe M.D.   On: 05/18/2017 14:56   Ct Abdomen Pelvis W Contrast  Result Date: 05/18/2017 CLINICAL DATA:  57 y/o F; history of Crohn's disease and prior ileocecal resection. Admitted with nausea, vomiting, and diarrhea. Fever develops since admission of unknown origin. EXAM: CT CHEST, ABDOMEN, AND PELVIS WITH CONTRAST TECHNIQUE: Multidetector CT imaging of the chest, abdomen and pelvis was performed following the standard protocol during bolus administration of intravenous contrast. CONTRAST:  17m ISOVUE-300 IOPAMIDOL (ISOVUE-300) INJECTION 61% COMPARISON:  03/01/2017 CT abdomen and pelvis. FINDINGS: CT CHEST FINDINGS Cardiovascular: Normal caliber thoracic aorta and main pulmonary artery. Normal heart size. No pericardial effusion. Moderate to severe coronary artery calcification. Mediastinum/Nodes: No enlarged mediastinal, hilar, or axillary lymph nodes. Thyroid gland, trachea, and esophagus demonstrate no significant findings. Lungs/Pleura: Left lower lobe patchy consolidation in bronchovascular distribution compatible with pneumonia. Few small small clusters of nodules are also present in the right lung likely related to infection. No pleural effusion  or pneumothorax. Musculoskeletal: No chest wall mass or suspicious bone lesions identified. CT ABDOMEN PELVIS FINDINGS Hepatobiliary: No focal liver abnormality is seen. Status post cholecystectomy. Mild stable prominence of the biliary system is likely compensatory post cholecystectomy. Pancreas: Unremarkable. No pancreatic ductal dilatation or surrounding inflammatory changes. Spleen: Normal in size without focal abnormality. Adrenals/Urinary Tract: Normal adrenal glands. Left kidney interpolar subcentimeter cyst. No other focal kidney lesion, urinary stone, hydronephrosis. Normal bladder. Stomach/Bowel: Chronic ileocecal postsurgical changes are stable. Mild fatty wall hypertrophy of colon compatible with chronic inflammation. No mucosal enhancement, obstructive change, stricture, or fistula identified. Vascular/Lymphatic: Aortic atherosclerosis. No enlarged abdominal or pelvic lymph nodes. Reproductive: Status post hysterectomy. No adnexal masses. Other: Stable chronic postsurgical changes within the ventral anterior abdominal wall. Tiny left paramedian paraumbilical hernia containing fat. Musculoskeletal: No fracture is seen. 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 IMPRESSION: 1. Patchy left lower lobe consolidation compatible with pneumonia. Small clusters of nodules in right lung are also likely related to infection. 2. Moderate to severe calcific atherosclerosis of coronary arteries. 3. Stable chronic findings of the abdomen and pelvis. Electronically Signed   By: LKristine GarbeM.D.   On: 05/18/2017 14:56       Assessment / Plan:    #[id number] old white female with long history of Crohn's disease, currently Entyvio.She is status post previous ileocecal resection, has history of chronic diarrhea in part secondary to pancreatic insufficiency. Admitted with several-day history of nausea vomiting and increase in her baseline diarrhea Initially felt to have  UTI on admission, treated with Rocephin, final urine culture negative Patient is improving, nausea and vomiting have resolved and she is requesting solid food. Will add Colestid 1 g twice daily see if this helps with chronic diarrhea #2 fever-onset after admission-but cultures negative CT chest abdomen and pelvis yesterday confirmed a left lower lobe pneumonia, no acute findings in the  abdomen She is now on Levaquin # 3 chronic pancreatic insufficiency-continue Creon 72,000 units with each meal #4 chronic pain syndrome-narcotic dependent-chronic low back pain Patient has been improved for a spinal stimulator and did well with the trial. Hopefully this can be placed shortly after discharge so that she can be weaned off of narcotics.  Contact  Amy Esterwood, P.A.-C               [phone]  Principal Problem:   Nausea & vomiting  Active Problems:   Crohn's disease ([place])   Urinary tract infection without hematuria   Abnormal x-ray of abdomen   Nausea and vomiting   Dehydration   LOS: 2 days   Amy Esterwood  05/19/2017, 9:49 AM   GI ATTENDING  Interval history data reviewed. Patient seen and examined. Agree with interval progress note. Noted to have pneumonia. No significant abdominal pain. Agree with Colestid for diarrhea. If 1 g twice daily does not help then increase to 2 g twice daily. We will continue to follow.  Docia Chuck. Geri Seminole., M.D. [facility] Division of Gastroenterology

## 2017-05-19 NOTE — Progress Notes (Signed)
Nutrition Follow-up  DOCUMENTATION CODES:   Severe malnutrition in context of chronic illness  INTERVENTION:  - Continue Magic Cup BID. - Continue to encourage PO intakes.   NUTRITION DIAGNOSIS:   Severe Malnutrition related to chronic illness(Crohn's disease) as evidenced by percent weight loss, energy intake < or equal to 75% for > or equal to 1 month. -ongoing  GOAL:   Patient will meet greater than or equal to 90% of their needs -unmet, improving.   MONITOR:   PO intake, Labs, Supplement acceptance, Weight trends  ASSESSMENT:   Patient with PMH significant for Crohn's disease and cyclical vomiting was referred to the ER by patient's gastroenterologist after noted persistent vomiting x 3 weeks. Pt admitted for intractable nausea/vomiting and dehydration.   No new weight since admission. Pt tried [employer] and did not like it so supplement was discontinued yesterday. Per review of flow sheet, she consumed 50% of breakfast and lunch on 3/12; 0% of breakfast and lunch on 3/13; 100% of breakfast, 50% of lunch, and 100% of dinner on 3/14; 50% of breakfast and 100% of lunch today. She has chronic abdominal pain. This has not been worsened with PO intakes.   Per Dr. Reggy Eye note this afternoon: pt has up to 11 loose stools/day which is normal for her and her GI doctors are aware, N/V improving and Reglan order in to aid with this, C.diff negative, CT abd/pelvis negative, hopeful for narcotics to be weaned as pt has been approved for spinal cord simulator for chronic pain. Plan for pt to d/c home when stable, possibly 3/16.  Medications reviewed; 5 mg oral Reglan TID, 56389 units Creon TID.  Labs reviewed; BUN: <5 mg/dL, Ca: 8.3 mg/dL.     Diet Order:  DIET SOFT Room service appropriate? Yes; Fluid consistency: Thin  EDUCATION NEEDS:   Education needs have been addressed  Skin:  Skin Assessment: Reviewed RN Assessment  Last BM:  3/14  Height:   Ht Readings from Last 1  Encounters:  05/15/17 5' 7"  (1.702 m)    Weight:   Wt Readings from Last 1 Encounters:  05/15/17 159 lb 6.3 oz (72.3 kg)    Ideal Body Weight:  61.4 kg  BMI:  Body mass index is 24.96 kg/m.  Estimated Nutritional Needs:   Kcal:  1750-1950 kcal/day  Protein:  75-85 g/day  Fluid:  >1.7 L/day      Jessica Matin, MS, RD, LDN, [facility] Inpatient Clinical Dietitian Pager # [phone] After hours/weekend pager # [phone]

## 2017-05-20 DIAGNOSIS — G43A0 Cyclical vomiting, not intractable: Secondary | ICD-10-CM

## 2017-05-20 DIAGNOSIS — K9089 Other intestinal malabsorption: Secondary | ICD-10-CM

## 2017-05-20 MED ORDER — ACETAMINOPHEN 325 MG PO TABS: 650.0000 mg | ORAL_TABLET | Freq: Four times a day (QID) | ORAL | 0 refills | Status: DC | PRN

## 2017-05-20 MED ORDER — COLESTIPOL HCL 1 G PO TABS: 1.0000 g | ORAL_TABLET | Freq: Two times a day (BID) | ORAL | 0 refills | Status: DC

## 2017-05-20 MED ORDER — LEVOFLOXACIN 750 MG PO TABS: 750.0000 mg | ORAL_TABLET | Freq: Every day | ORAL | 0 refills | Status: AC

## 2017-05-20 MED ORDER — METOCLOPRAMIDE HCL 5 MG PO TABS: 5.0000 mg | ORAL_TABLET | Freq: Three times a day (TID) | ORAL | 0 refills | Status: DC

## 2017-05-20 MED ORDER — PANCRELIPASE (LIP-PROT-AMYL) 36000-114000 UNITS PO CPEP: 36000.0000 [IU] | ORAL_CAPSULE | Freq: Three times a day (TID) | ORAL | 0 refills | Status: DC

## 2017-05-20 MED ORDER — DICYCLOMINE HCL 10 MG PO CAPS: 10.0000 mg | ORAL_CAPSULE | Freq: Three times a day (TID) | ORAL | 0 refills | Status: DC

## 2017-05-20 NOTE — Discharge Summary (Signed)
Physician Discharge Summary  [name] IDH:[id number] DOB: 1960-09-16 DOA: 05/15/2017  PCP: Rochel Brome, MD  Admit date: 05/15/2017 Discharge date: 05/20/2017  Recommendations for Outpatient Follow-up:  Take Levaquin for 7 days on discharge Follow up with GI per scheduled appt, per GI will be sch in about 4 weeks  Discharge Diagnoses:  Principal Problem:   Nausea & vomiting  Active Problems:   Crohn's disease ([place])   Urinary tract infection without hematuria   Abnormal x-ray of abdomen   Nausea and vomiting   Dehydration    Discharge Condition: stable   Diet recommendation: as tolerated   History of present illness:  57 y/o with Crohn's disease, chronic pain on narcotics, pancreatic insufficiency with h/o nausea, vomiting and loose stools (she states that loose stools are the norm for her) at home who presents for worsening symtoms over the past 2-3 wks. Her GI doctor advised her to come to the ER.  In ED, Xrays of abdomen unrevealing. Found to have a + UA and started on Ceftriaxone. Given IVF and treated with IV antiemetics  Hospital Course:   Principal Problem:   Nausea & vomiting / Chronic abdominal pain and Crohn's disease - Crohn's disease managed with Entyvio by GI - per GI, continue bentyl, colestid, creon - C.diff negative  - CT abd/pelvis negative  Active Problems: Pneumonia, possible UTI - she has no symptoms of a UTI but UA is +  - U culture not sent prior to antibiotics- blood cx negative - 3/14- obtained CT chest today as CXR was unrevealing and she has cough with green sputum- CT reveals a LLL pneumonia and small amount of nodules on right suggestive of infection- CT abd/pelvis unrevealing  - Started Levaquin 3/15, will continue for 7 days on discharge   Pancreatic insufficiency - Continue pancreatic enzymes   Hypomagnesemia - Supplemented   Chronic pain - approved for spinal cord simulator and hopefully will be able to get narcotics weaned  subsequently - cont Oxy IR and Neurontin   DVT prophylaxis: Lovenox subQ Code Status: Full code Family Communication: no family at bedside this am   Consultants:   GI  Procedures:   None   Antimicrobials:   Levaquin, rocephin, Keflex    Signed:  Leisa Lenz, MD  [facility] 05/20/2017, 11:29 AM  Pager #: [phone]  Time spent in minutes: more than 30 minutes  Discharge Exam: Vitals:   05/19/17 2200 05/20/17 0520  BP: 127/73 (!) 97/53  Pulse: 66 62  Resp: 20 16  Temp: 98.2 F (36.8 C) 98.2 F (36.8 C)  SpO2: 98% 95%   Vitals:   05/19/17 0826 05/19/17 1308 05/19/17 2200 05/20/17 0520  BP: 122/69 (!) 108/48 127/73 (!) 97/53  Pulse: 81 71 66 62  Resp:  16 20 16   Temp:  98.6 F (37 C) 98.2 F (36.8 C) 98.2 F (36.8 C)  TempSrc:  Oral Oral Oral  SpO2:  98% 98% 95%  Weight:      Height:        General: Pt is alert, follows commands appropriately, not in acute distress Cardiovascular: Regular rate and rhythm, S1/S2 (+) Respiratory: Clear to auscultation bilaterally, no wheezing, no crackles, no rhonchi Abdominal: Soft, non tender, non distended, bowel sounds +, no guarding Extremities: no edema, no cyanosis, pulses palpable bilaterally DP and PT Neuro: Grossly nonfocal  Discharge Instructions  Discharge Instructions    Call MD for:  persistant nausea and vomiting   Complete by:  As directed  Call MD for:  redness, tenderness, or signs of infection (pain, swelling, redness, odor or green/yellow discharge around incision site)   Complete by:  As directed    Call MD for:  severe uncontrolled pain   Complete by:  As directed    Diet - low sodium heart healthy   Complete by:  As directed    Discharge instructions   Complete by:  As directed    Take Levaquin for 7 days on discharge Follow up with GI per scheduled appt, per GI will be sch in about 4 weeks   Increase activity slowly   Complete by:  As directed      Allergies as of  05/20/2017      Reactions   Codeine Hives, Nausea And Vomiting   Humira [adalimumab] Rash      Medication List    STOP taking these medications   guaiFENesin-dextromethorphan 100-10 MG/5ML syrup Commonly known as:  ROBITUSSIN DM   hydrocortisone 5 MG tablet Commonly known as:  CORTEF     TAKE these medications   acetaminophen 325 MG tablet Commonly known as:  TYLENOL Take 2 tablets (650 mg total) by mouth every 6 (six) hours as needed for mild pain (or Fever >/= 101).   clobetasol cream 0.05 % Commonly known as:  TEMOVATE Apply 1 application topically 2 (two) times daily.   colestipol 1 g tablet Commonly known as:  COLESTID Take 1 tablet (1 g total) by mouth 2 (two) times daily.   cyanocobalamin 1000 MCG/ML injection Commonly known as:  (VITAMIN B-12) Inject 1 mL (1,000 mcg total) into the skin every 30 (thirty) days.   dicyclomine 10 MG capsule Commonly known as:  BENTYL Take 1 capsule (10 mg total) by mouth 3 (three) times daily before meals.   econazole nitrate 1 % cream APPLY  CREAM TOPICALLY ONCE DAILY   ENTYVIO IV Inject into the vein every 8 (eight) weeks. Receives at [facility].   gabapentin 400 MG capsule Commonly known as:  NEURONTIN Take 1 capsule by mouth at lunch, 2 at dinner and 3 at bedtime   levofloxacin 750 MG tablet Commonly known as:  LEVAQUIN Take 1 tablet (750 mg total) by mouth daily for 7 days.   lipase/protease/amylase 36000 UNITS Cpep capsule Commonly known as:  CREON Take 1 capsule (36,000 Units total) by mouth 3 (three) times daily before meals. What changed:    how much to take  how to take this  when to take this  additional instructions   metoCLOPramide 5 MG tablet Commonly known as:  REGLAN Take 1 tablet (5 mg total) by mouth 3 (three) times daily before meals. What changed:    See the new instructions.  Another medication with the same name was removed. Continue taking this medication, and follow the directions  you see here.   Needles & Syringes Misc 1 Syringe by Does not apply route every 30 (thirty) days.   omeprazole 40 MG capsule Commonly known as:  PRILOSEC Take 1 capsule (40 mg total) by mouth daily.   ondansetron 4 MG tablet Commonly known as:  ZOFRAN Take 4 mg by mouth every 8 (eight) hours as needed for nausea or vomiting.   Oxycodone HCl 10 MG Tabs Take 10 mg by mouth 5 (five) times daily as needed (pain). 1 tablet up to five times a day as needed for severe pain.   PROAIR HFA 108 (90 Base) MCG/ACT inhaler Generic drug:  albuterol Inhale 2 puffs into the  lungs every 4 (four) hours as needed for wheezing or shortness of breath.   promethazine 25 MG suppository Commonly known as:  PHENERGAN Place 1 suppository (25 mg total) rectally every 6 (six) hours as needed for nausea or vomiting.   promethazine 25 MG tablet Commonly known as:  PHENERGAN TAKE 1 TABLET BY MOUTH EVERY 6 HOURS AS NEEDED FOR NAUSEA AND VOMITING   propranolol ER 80 MG 24 hr capsule Commonly known as:  INDERAL LA Take 1 capsule by mouth daily.   SYMBICORT 80-4.5 MCG/ACT inhaler Generic drug:  budesonide-formoterol Inhale 2 puffs into the lungs daily as needed (SOB, wheezing).   tiZANidine 4 MG tablet Commonly known as:  ZANAFLEX Take 4 mg by mouth every 8 (eight) hours as needed for muscle spasms.   topiramate 50 MG tablet Commonly known as:  TOPAMAX TAKE ONE TABLET BY MOUTH AT BEDTIME   traZODone 50 MG tablet Commonly known as:  DESYREL Take 50 mg by mouth at bedtime.   zolmitriptan 5 MG nasal solution Commonly known as:  ZOMIG 1 spray in one nostril.  May repeat 1 spray once after 2 hours if needed. Not to exceed 2 sprays in 24 hours What changed:    how much to take  how to take this  when to take this  reasons to take this  additional instructions      Follow-up Information    Cox, Kirsten, MD. Schedule an appointment as soon as possible for a visit in 1 week(s).   Specialties:   Family Medicine, Interventional Cardiology, Radiology, Anesthesiology Contact information: [place] [address] [phone]            The results of significant diagnostics from this hospitalization (including imaging, microbiology, ancillary and laboratory) are listed below for reference.    Significant Diagnostic Studies: Ct Chest W Contrast  Result Date: 05/18/2017 CLINICAL DATA:  57 y/o F; history of Crohn's disease and prior ileocecal resection. Admitted with nausea, vomiting, and diarrhea. Fever develops since admission of unknown origin. EXAM: CT CHEST, ABDOMEN, AND PELVIS WITH CONTRAST TECHNIQUE: Multidetector CT imaging of the chest, abdomen and pelvis was performed following the standard protocol during bolus administration of intravenous contrast. CONTRAST:  150m ISOVUE-300 IOPAMIDOL (ISOVUE-300) INJECTION 61% COMPARISON:  03/01/2017 CT abdomen and pelvis. FINDINGS: CT CHEST FINDINGS Cardiovascular: Normal caliber thoracic aorta and main pulmonary artery. Normal heart size. No pericardial effusion. Moderate to severe coronary artery calcification. Mediastinum/Nodes: No enlarged mediastinal, hilar, or axillary lymph nodes. Thyroid gland, trachea, and esophagus demonstrate no significant findings. Lungs/Pleura: Left lower lobe patchy consolidation in bronchovascular distribution compatible with pneumonia. Few small small clusters of nodules are also present in the right lung likely related to infection. No pleural effusion or pneumothorax. Musculoskeletal: No chest wall mass or suspicious bone lesions identified. CT ABDOMEN PELVIS FINDINGS Hepatobiliary: No focal liver abnormality is seen. Status post cholecystectomy. Mild stable prominence of the biliary system is likely compensatory post cholecystectomy. Pancreas: Unremarkable. No pancreatic ductal dilatation or surrounding inflammatory changes. Spleen: Normal in size without focal abnormality.  Adrenals/Urinary Tract: Normal adrenal glands. Left kidney interpolar subcentimeter cyst. No other focal kidney lesion, urinary stone, hydronephrosis. Normal bladder. Stomach/Bowel: Chronic ileocecal postsurgical changes are stable. Mild fatty wall hypertrophy of colon compatible with chronic inflammation. No mucosal enhancement, obstructive change, stricture, or fistula identified. Vascular/Lymphatic: Aortic atherosclerosis. No enlarged abdominal or pelvic lymph nodes. Reproductive: Status post hysterectomy. No adnexal masses. Other: Stable chronic postsurgical changes within the ventral anterior  abdominal wall. Tiny left paramedian paraumbilical hernia containing fat. Musculoskeletal: No fracture is seen. 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 IMPRESSION: 1. Patchy left lower lobe consolidation compatible with pneumonia. Small clusters of nodules in right lung are also likely related to infection. 2. Moderate to severe calcific atherosclerosis of coronary arteries. 3. Stable chronic findings of the abdomen and pelvis. Electronically Signed   By: Kristine Garbe M.D.   On: 05/18/2017 14:56   Ct Abdomen Pelvis W Contrast  Result Date: 05/18/2017 CLINICAL DATA:  57 y/o F; history of Crohn's disease and prior ileocecal resection. Admitted with nausea, vomiting, and diarrhea. Fever develops since admission of unknown origin. EXAM: CT CHEST, ABDOMEN, AND PELVIS WITH CONTRAST TECHNIQUE: Multidetector CT imaging of the chest, abdomen and pelvis was performed following the standard protocol during bolus administration of intravenous contrast. CONTRAST:  178m ISOVUE-300 IOPAMIDOL (ISOVUE-300) INJECTION 61% COMPARISON:  03/01/2017 CT abdomen and pelvis. FINDINGS: CT CHEST FINDINGS Cardiovascular: Normal caliber thoracic aorta and main pulmonary artery. Normal heart size. No pericardial effusion. Moderate to severe coronary artery calcification. Mediastinum/Nodes: No  enlarged mediastinal, hilar, or axillary lymph nodes. Thyroid gland, trachea, and esophagus demonstrate no significant findings. Lungs/Pleura: Left lower lobe patchy consolidation in bronchovascular distribution compatible with pneumonia. Few small small clusters of nodules are also present in the right lung likely related to infection. No pleural effusion or pneumothorax. Musculoskeletal: No chest wall mass or suspicious bone lesions identified. CT ABDOMEN PELVIS FINDINGS Hepatobiliary: No focal liver abnormality is seen. Status post cholecystectomy. Mild stable prominence of the biliary system is likely compensatory post cholecystectomy. Pancreas: Unremarkable. No pancreatic ductal dilatation or surrounding inflammatory changes. Spleen: Normal in size without focal abnormality. Adrenals/Urinary Tract: Normal adrenal glands. Left kidney interpolar subcentimeter cyst. No other focal kidney lesion, urinary stone, hydronephrosis. Normal bladder. Stomach/Bowel: Chronic ileocecal postsurgical changes are stable. Mild fatty wall hypertrophy of colon compatible with chronic inflammation. No mucosal enhancement, obstructive change, stricture, or fistula identified. Vascular/Lymphatic: Aortic atherosclerosis. No enlarged abdominal or pelvic lymph nodes. Reproductive: Status post hysterectomy. No adnexal masses. Other: Stable chronic postsurgical changes within the ventral anterior abdominal wall. Tiny left paramedian paraumbilical hernia containing fat. Musculoskeletal: No fracture is seen. 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 IMPRESSION: 1. Patchy left lower lobe consolidation compatible with pneumonia. Small clusters of nodules in right lung are also likely related to infection. 2. Moderate to severe calcific atherosclerosis of coronary arteries. 3. Stable chronic findings of the abdomen and pelvis. Electronically Signed   By: LKristine GarbeM.D.   On: 05/18/2017 14:56    Dg Abd Acute W/chest  Result Date: 05/15/2017 CLINICAL DATA:  Abdominal pain with nausea, vomiting and diarrhea x2 weeks. History of Crohn's disease. EXAM: DG ABDOMEN ACUTE W/ 1V CHEST COMPARISON:  CXR 03/14/2017 FINDINGS: Heart size and mediastinal contours are within normal limits. Both lungs are clear. Cholecystectomy clips are present in the right upper quadrant. Chain sutures are also seen in the right hemiabdomen. Nonobstructed, nondistended bowel gas pattern. No free air is noted. No organomegaly. No radiopaque calculi. Phleboliths are present in the lower pelvis. Unremarkable sacroiliac joints. IMPRESSION: Negative abdominal radiographs.  No acute cardiopulmonary disease. Electronically Signed   By: DAshley RoyaltyM.D.   On: 05/15/2017 23:38    Microbiology: Recent Results (from the past 240 hour(s))  Culture, Urine     Status: Abnormal   Collection Time: 05/15/17  7:50 PM  Result Value Ref Range Status   Specimen Description  Final    URINE, RANDOM Performed at [facility], [place] [address], [place], [place] [address]    Special Requests   Final    NONE Performed at [facility], [place] [address] [place], [place] [address]    Culture (A)  Final    <10,000 COLONIES/mL INSIGNIFICANT GROWTH Performed at [place] [address], [place], [place] [address]    Report Status 05/17/2017 FINAL  Final  Culture, blood (Routine X 2) w Reflex to ID Panel     Status: None (Preliminary result)   Collection Time: 05/17/17  7:58 AM  Result Value Ref Range Status   Specimen Description   Final    BLOOD RIGHT ANTECUBITAL Performed at [place] [address], [place], [place] [address]    Special Requests   Final    BOTTLES DRAWN AEROBIC AND ANAEROBIC Blood Culture adequate volume Performed at [place] [address], [place], [place] [address]    Culture   Final    NO GROWTH 2 DAYS Performed at  [place] [address] [place], [place] [address]    Report Status PENDING  Incomplete  Culture, blood (Routine X 2) w Reflex to ID Panel     Status: None (Preliminary result)   Collection Time: 05/17/17  8:05 AM  Result Value Ref Range Status   Specimen Description   Final    BLOOD LEFT ARM Performed at [facility] [address], [place], [place] [address]    Special Requests   Final    BOTTLES DRAWN AEROBIC AND ANAEROBIC Blood Culture adequate volume Performed at [facility] [address] [place], [place] [address]    Culture   Final    NO GROWTH 2 DAYS Performed at [facility] [address], [place], [place] [address]    Report Status PENDING  Incomplete  C difficile quick scan w PCR reflex     Status: None   Collection Time: 05/17/17  5:31 PM  Result Value Ref Range Status   C Diff antigen NEGATIVE NEGATIVE Final   C Diff toxin NEGATIVE NEGATIVE Final   C Diff interpretation No C. difficile detected.  Final    Comment: Performed at [facility], [place] [address] [place], [place] [address]     Labs: Basic Metabolic Panel: Recent Labs  Lab 05/15/17 1149 05/16/17 0545 05/17/17 0547 05/18/17 0603  NA 140 142 142 138  K 3.3* 3.3* 3.9 3.8  CL 102 104 107 106  CO2 27 26 25 24   GLUCOSE 109* 92 98 105*  BUN 10 8 <5* <5*  CREATININE 0.63 0.71 0.70 0.72  CALCIUM 9.6 8.8* 9.3 8.3*  MG  --   --  1.5* 1.8   Liver Function Tests: Recent Labs  Lab 05/15/17 1149  AST 32  ALT 23  ALKPHOS 145*  BILITOT 0.4  PROT 8.4*  ALBUMIN 3.5   Recent Labs  Lab 05/15/17 1149  LIPASE 27   No results for input(s): AMMONIA in the last 168 hours. CBC: Recent Labs  Lab 05/15/17 1149 05/16/17 0545 05/17/17 0547  WBC 7.1 5.7 8.6  HGB 12.4 9.8* 10.8*  HCT 38.4 31.9* 34.7*  MCV 82.8 85.5 84.4  PLT 373 246 287   Cardiac Enzymes: No results for input(s): CKTOTAL, CKMB, CKMBINDEX, TROPONINI in the  last 168 hours. BNP: BNP (last 3 results) No results for input(s):  BNP in the last 8760 hours.  ProBNP (last 3 results) No results for input(s): PROBNP in the last 8760 hours.  CBG: No results for input(s): GLUCAP in the last 168 hours.

## 2017-05-20 NOTE — Discharge Instructions (Signed)
[facility] Hospital Stay Proper nutrition can help your body recover from illness and injury.   Foods and beverages high in protein, vitamins, and minerals help rebuild muscle loss, promote healing, & reduce fall risk.   In addition to eating healthy foods, a nutrition shake is an easy, delicious way to get the nutrition you need during and after your hospital stay  It is recommended that you continue to drink 2 bottles per day of: Magic Cup TID for at least 1 month (30 days) after your hospital stay   Tips for adding a nutrition shake into your routine: As allowed, drink one with vitamins or medications instead of water or juice Enjoy one as a tasty mid-morning or afternoon snack Drink cold or make a milkshake out of it Drink one instead of milk with cereal or snacks Use as a coffee creamer   Available at the following grocery stores and pharmacies:           * [place] [phone]            For COUPONS visit: www.ensure.com/join or http://dawson-may.com/   Suggested Substitutions Ensure Plus = Boost Plus = Carnation Breakfast Essentials = Boost Compact Ensure Active Clear = Boost Breeze Glucerna Shake = Boost Glucose Control = Carnation Breakfast Essentials SUGAR FREE

## 2017-05-20 NOTE — Progress Notes (Addendum)
Patient ID: [name], female   DOB: 07/03/1960, 57 y.o.   MRN: [id number]    Progress Note   Subjective   Doing well- feels much better- eating without difficulty  Diarrhea has decreased with Colestid - none through night or this am so far   Objective   Vital signs in last 24 hours: Temp:  [98.2 F (36.8 C)-98.6 F (37 C)] 98.2 F (36.8 C) (03/16 0520) Pulse Rate:  [62-71] 62 (03/16 0520) Resp:  [16-20] 16 (03/16 0520) BP: (97-127)/(48-73) 97/53 (03/16 0520) SpO2:  [95 %-98 %] 95 % (03/16 0520) Last BM Date: 05/19/17 General:    white female in NAD Heart:  Regular rate and rhythm; no murmurs Lungs: Respirations even and unlabored, lungs CTA bilaterally Abdomen:  Soft, nontender and nondistended. Normal bowel sounds. Extremities:  Without edema. Neurologic:  Alert and oriented,  grossly normal neurologically. Psych:  Cooperative. Normal mood and affect.  Intake/Output from previous day: 03/15 0701 - 03/16 0700 In: 1435 [P.O.:960; I.V.:225; IV Piggyback:150] Out: -  Intake/Output this shift: No intake/output data recorded.  Lab Results: No results for input(s): WBC, HGB, HCT, PLT in the last 72 hours. BMET Recent Labs    05/18/17 0603  NA 138  K 3.8  CL 106  CO2 24  GLUCOSE 105*  BUN <5*  CREATININE 0.72  CALCIUM 8.3*   LFT No results for input(s): PROT, ALBUMIN, AST, ALT, ALKPHOS, BILITOT, BILIDIR, IBILI in the last 72 hours. PT/INR No results for input(s): LABPROT, INR in the last 72 hours.  Studies/Results: Ct Chest W Contrast  Result Date: 05/18/2017 CLINICAL DATA:  57 y/o F; history of Crohn's disease and prior ileocecal resection. Admitted with nausea, vomiting, and diarrhea. Fever develops since admission of unknown origin. EXAM: CT CHEST, ABDOMEN, AND PELVIS WITH CONTRAST TECHNIQUE: Multidetector CT imaging of the chest, abdomen and pelvis was performed following the standard protocol during bolus administration of intravenous contrast.  CONTRAST:  129m ISOVUE-300 IOPAMIDOL (ISOVUE-300) INJECTION 61% COMPARISON:  03/01/2017 CT abdomen and pelvis. FINDINGS: CT CHEST FINDINGS Cardiovascular: Normal caliber thoracic aorta and main pulmonary artery. Normal heart size. No pericardial effusion. Moderate to severe coronary artery calcification. Mediastinum/Nodes: No enlarged mediastinal, hilar, or axillary lymph nodes. Thyroid gland, trachea, and esophagus demonstrate no significant findings. Lungs/Pleura: Left lower lobe patchy consolidation in bronchovascular distribution compatible with pneumonia. Few small small clusters of nodules are also present in the right lung likely related to infection. No pleural effusion or pneumothorax. Musculoskeletal: No chest wall mass or suspicious bone lesions identified. CT ABDOMEN PELVIS FINDINGS Hepatobiliary: No focal liver abnormality is seen. Status post cholecystectomy. Mild stable prominence of the biliary system is likely compensatory post cholecystectomy. Pancreas: Unremarkable. No pancreatic ductal dilatation or surrounding inflammatory changes. Spleen: Normal in size without focal abnormality. Adrenals/Urinary Tract: Normal adrenal glands. Left kidney interpolar subcentimeter cyst. No other focal kidney lesion, urinary stone, hydronephrosis. Normal bladder. Stomach/Bowel: Chronic ileocecal postsurgical changes are stable. Mild fatty wall hypertrophy of colon compatible with chronic inflammation. No mucosal enhancement, obstructive change, stricture, or fistula identified. Vascular/Lymphatic: Aortic atherosclerosis. No enlarged abdominal or pelvic lymph nodes. Reproductive: Status post hysterectomy. No adnexal masses. Other: Stable chronic postsurgical changes within the ventral anterior abdominal wall. Tiny left paramedian paraumbilical hernia containing fat. Musculoskeletal: No fracture is seen. 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 IMPRESSION: 1. Patchy left lower  lobe consolidation compatible with pneumonia. Small clusters of nodules in right lung are also likely related to infection. 2. Moderate  to severe calcific atherosclerosis of coronary arteries. 3. Stable chronic findings of the abdomen and pelvis. Electronically Signed   By: Kristine Garbe M.D.   On: 05/18/2017 14:56   Ct Abdomen Pelvis W Contrast  Result Date: 05/18/2017 CLINICAL DATA:  57 y/o F; history of Crohn's disease and prior ileocecal resection. Admitted with nausea, vomiting, and diarrhea. Fever develops since admission of unknown origin. EXAM: CT CHEST, ABDOMEN, AND PELVIS WITH CONTRAST TECHNIQUE: Multidetector CT imaging of the chest, abdomen and pelvis was performed following the standard protocol during bolus administration of intravenous contrast. CONTRAST:  140m ISOVUE-300 IOPAMIDOL (ISOVUE-300) INJECTION 61% COMPARISON:  03/01/2017 CT abdomen and pelvis. FINDINGS: CT CHEST FINDINGS Cardiovascular: Normal caliber thoracic aorta and main pulmonary artery. Normal heart size. No pericardial effusion. Moderate to severe coronary artery calcification. Mediastinum/Nodes: No enlarged mediastinal, hilar, or axillary lymph nodes. Thyroid gland, trachea, and esophagus demonstrate no significant findings. Lungs/Pleura: Left lower lobe patchy consolidation in bronchovascular distribution compatible with pneumonia. Few small small clusters of nodules are also present in the right lung likely related to infection. No pleural effusion or pneumothorax. Musculoskeletal: No chest wall mass or suspicious bone lesions identified. CT ABDOMEN PELVIS FINDINGS Hepatobiliary: No focal liver abnormality is seen. Status post cholecystectomy. Mild stable prominence of the biliary system is likely compensatory post cholecystectomy. Pancreas: Unremarkable. No pancreatic ductal dilatation or surrounding inflammatory changes. Spleen: Normal in size without focal abnormality. Adrenals/Urinary Tract: Normal adrenal  glands. Left kidney interpolar subcentimeter cyst. No other focal kidney lesion, urinary stone, hydronephrosis. Normal bladder. Stomach/Bowel: Chronic ileocecal postsurgical changes are stable. Mild fatty wall hypertrophy of colon compatible with chronic inflammation. No mucosal enhancement, obstructive change, stricture, or fistula identified. Vascular/Lymphatic: Aortic atherosclerosis. No enlarged abdominal or pelvic lymph nodes. Reproductive: Status post hysterectomy. No adnexal masses. Other: Stable chronic postsurgical changes within the ventral anterior abdominal wall. Tiny left paramedian paraumbilical hernia containing fat. Musculoskeletal: No fracture is seen. 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 IMPRESSION: 1. Patchy left lower lobe consolidation compatible with pneumonia. Small clusters of nodules in right lung are also likely related to infection. 2. Moderate to severe calcific atherosclerosis of coronary arteries. 3. Stable chronic findings of the abdomen and pelvis. Electronically Signed   By: LKristine GarbeM.D.   On: 05/18/2017 14:56       Assessment / Plan:    #1  57yo female with long hx Crohns disease , on Entyvio-admitted  With nausea,vomiting, weakness and worsening of her chronic diarrhea Initial felt UTI- culture neg  Onset fever after admit - CT abd neg , but had LLL pneumonia- started on Levaquin and has improved significantly over last couple days Diarrhea - cdiff neg- improving with addition of Colestid 1 Gm BID Continue Bentyl 10 mh qid prn Continue  Creon 36000- 2 with each meal Next Entyvio dose 4/9  [place] f/u appt with Dr NSilverio Decampin 4 weeks   Contact  Amy [place] P.A.-C               [phone]  Principal Problem:   Nausea & vomiting  Active Problems:   Crohn's disease ([place]   Urinary tract infection without hematuria   Abnormal x-ray of abdomen    Nausea and vomiting   Dehydration     LOS: 3 days   Amy Esterwood  05/20/2017, 10:22 AM  GI ATTENDING  Interval history data reviewed. Patient seen and examined. Agree with  interval progress note. Patient doing better. Responding to Colestid. Ready for discharge. GI follow-up has been arranged. We'll sign off.  Docia Chuck. Geri Seminole., M.D. [facility] Division of Gastroenterology

## 2017-05-21 LAB — MISC LABCORP TEST (SEND OUT): LABCORP TEST CODE: 183480

## 2017-05-22 ENCOUNTER — Other Ambulatory Visit: Payer: Self-pay

## 2017-05-22 ENCOUNTER — Telehealth: Payer: Self-pay

## 2017-05-22 LAB — CULTURE, BLOOD (ROUTINE X 2)
CULTURE: NO GROWTH
CULTURE: NO GROWTH
SPECIAL REQUESTS: ADEQUATE
Special Requests: ADEQUATE

## 2017-05-22 MED ORDER — COLESTIPOL HCL 1 G PO TABS: 1.0000 g | ORAL_TABLET | Freq: Two times a day (BID) | ORAL | 6 refills | Status: DC

## 2017-05-22 MED ORDER — DICYCLOMINE HCL 10 MG PO CAPS: 10.0000 mg | ORAL_CAPSULE | Freq: Four times a day (QID) | ORAL | 6 refills | Status: DC | PRN

## 2017-05-22 NOTE — Telephone Encounter (Signed)
-----   Message from Alfredia Ferguson, PA-C sent at 05/22/2017 12:51 PM EDT ----- Regarding: RE: Lybrand Punam I think Ok to wait for Nandigam first available  ----- Message ----- From: Greggory Keen, LPN Sent: 12/02/8002   9:28 AM To: Alfredia Ferguson, PA-C Subject: RE: Wainer Caterine                           I am mailing the prescriptions to her. If I send them to the pharmacy, they will be put on file because they are not due for a month.  Do you want me to put her on your schedule? Dr Silverio Decamp has no openings. ----- Message ----- From: Alfredia Ferguson, PA-C Sent: 05/20/2017  10:29 AM To: Greggory Keen, LPN Subject: Endo Jacqualyn                               Beth-pt has been inhospital this week - going home today  - please get her a f/u Dr Silverio Decamp in 4 weeks Also needs refills on Bentyl 10 mg q 6 hours  Prn  #120/6 refills . We also started Colestid 1 gm bid to be taken 2 hours away from other meds- please send rx and 6 refills

## 2017-05-22 NOTE — Telephone Encounter (Signed)
Called to patient and left a message. Appointment scheduled for follow up. Prescriptions and appointment cards mailed.

## 2017-05-23 NOTE — Telephone Encounter (Signed)
I tried to contact patient to inform her to come in for her [facility] lab she needs to be here on 4/8 to pick up kit and get lab drawn the day before her Entyvio injection

## 2017-05-25 DIAGNOSIS — Z79891 Long term (current) use of opiate analgesic: Secondary | ICD-10-CM | POA: Diagnosis not present

## 2017-05-25 DIAGNOSIS — M5137 Other intervertebral disc degeneration, lumbosacral region: Secondary | ICD-10-CM | POA: Diagnosis not present

## 2017-05-25 DIAGNOSIS — M47817 Spondylosis without myelopathy or radiculopathy, lumbosacral region: Secondary | ICD-10-CM | POA: Diagnosis not present

## 2017-05-25 DIAGNOSIS — Z79899 Other long term (current) drug therapy: Secondary | ICD-10-CM | POA: Diagnosis not present

## 2017-05-25 DIAGNOSIS — G894 Chronic pain syndrome: Secondary | ICD-10-CM | POA: Diagnosis not present

## 2017-05-25 DIAGNOSIS — G629 Polyneuropathy, unspecified: Secondary | ICD-10-CM | POA: Diagnosis not present

## 2017-05-30 DIAGNOSIS — J45909 Unspecified asthma, uncomplicated: Secondary | ICD-10-CM | POA: Diagnosis not present

## 2017-05-30 DIAGNOSIS — G894 Chronic pain syndrome: Secondary | ICD-10-CM | POA: Diagnosis not present

## 2017-05-30 DIAGNOSIS — M5136 Other intervertebral disc degeneration, lumbar region: Secondary | ICD-10-CM | POA: Diagnosis not present

## 2017-05-30 DIAGNOSIS — M47816 Spondylosis without myelopathy or radiculopathy, lumbar region: Secondary | ICD-10-CM | POA: Diagnosis not present

## 2017-05-30 DIAGNOSIS — Z87891 Personal history of nicotine dependence: Secondary | ICD-10-CM | POA: Diagnosis not present

## 2017-05-30 DIAGNOSIS — K219 Gastro-esophageal reflux disease without esophagitis: Secondary | ICD-10-CM | POA: Diagnosis not present

## 2017-05-30 DIAGNOSIS — Z79899 Other long term (current) drug therapy: Secondary | ICD-10-CM | POA: Diagnosis not present

## 2017-05-30 DIAGNOSIS — Z888 Allergy status to other drugs, medicaments and biological substances status: Secondary | ICD-10-CM | POA: Diagnosis not present

## 2017-05-30 DIAGNOSIS — Z7951 Long term (current) use of inhaled steroids: Secondary | ICD-10-CM | POA: Diagnosis not present

## 2017-05-31 NOTE — Telephone Encounter (Signed)
Patient is coming to pick up lab kit for [facility]  On 06/12/2017 and Is aware of her appointment with Milderd Meager in Aug

## 2017-06-05 DIAGNOSIS — M5136 Other intervertebral disc degeneration, lumbar region: Secondary | ICD-10-CM | POA: Diagnosis not present

## 2017-06-05 DIAGNOSIS — G894 Chronic pain syndrome: Secondary | ICD-10-CM | POA: Diagnosis not present

## 2017-06-05 DIAGNOSIS — M96843 Postprocedural seroma of a musculoskeletal structure following other procedure: Secondary | ICD-10-CM | POA: Diagnosis not present

## 2017-06-05 DIAGNOSIS — M47816 Spondylosis without myelopathy or radiculopathy, lumbar region: Secondary | ICD-10-CM | POA: Diagnosis not present

## 2017-06-06 ENCOUNTER — Encounter (HOSPITAL_COMMUNITY): Payer: BLUE CROSS/BLUE SHIELD

## 2017-06-07 ENCOUNTER — Telehealth: Payer: Self-pay | Admitting: Gastroenterology

## 2017-06-07 ENCOUNTER — Other Ambulatory Visit: Payer: Self-pay

## 2017-06-07 DIAGNOSIS — K50018 Crohn's disease of small intestine with other complication: Secondary | ICD-10-CM

## 2017-06-07 NOTE — Telephone Encounter (Signed)
Spoke with the patient. She reports she is "doing great." She had a spinal cord stimulator placed 05/30/17. She is scheduled for the Entyvio infusion on 06/13/17. Will this "surgery" prevent her from being able to receive her infusion?

## 2017-06-07 NOTE — Telephone Encounter (Signed)
Pt had surgery two weeks ago and wants to know if it is ok to have her entyvio. Please call her.

## 2017-06-07 NOTE — Telephone Encounter (Signed)
Patient notified. Order for the Entyvio infusion entered.

## 2017-06-07 NOTE — Telephone Encounter (Signed)
She can still do the Entyvio infusion, shouldn't be an issue. Thanks

## 2017-06-12 ENCOUNTER — Other Ambulatory Visit: Payer: BLUE CROSS/BLUE SHIELD

## 2017-06-12 DIAGNOSIS — K5090 Crohn's disease, unspecified, without complications: Secondary | ICD-10-CM | POA: Diagnosis not present

## 2017-06-13 ENCOUNTER — Encounter (HOSPITAL_COMMUNITY): Payer: Self-pay

## 2017-06-13 ENCOUNTER — Encounter (HOSPITAL_COMMUNITY)
Admission: RE | Admit: 2017-06-13 | Discharge: 2017-06-13 | Disposition: A | Discharge status: Home / Self Care | Payer: BLUE CROSS/BLUE SHIELD | Source: Ambulatory Visit | Attending: Gastroenterology | Admitting: Gastroenterology

## 2017-06-13 ENCOUNTER — Ambulatory Visit (HOSPITAL_COMMUNITY)
Admission: RE | Admit: 2017-06-13 | Discharge: 2017-06-13 | Disposition: A | Discharge status: Home / Self Care | Payer: BLUE CROSS/BLUE SHIELD | Source: Ambulatory Visit | Attending: Pain Medicine | Admitting: Pain Medicine

## 2017-06-13 ENCOUNTER — Other Ambulatory Visit (HOSPITAL_COMMUNITY): Payer: Self-pay | Admitting: Pain Medicine

## 2017-06-13 DIAGNOSIS — I70209 Unspecified atherosclerosis of native arteries of extremities, unspecified extremity: Secondary | ICD-10-CM | POA: Diagnosis not present

## 2017-06-13 DIAGNOSIS — K50119 Crohn's disease of large intestine with unspecified complications: Secondary | ICD-10-CM | POA: Diagnosis not present

## 2017-06-13 DIAGNOSIS — M1711 Unilateral primary osteoarthritis, right knee: Secondary | ICD-10-CM | POA: Diagnosis not present

## 2017-06-13 DIAGNOSIS — M25561 Pain in right knee: Secondary | ICD-10-CM | POA: Diagnosis not present

## 2017-06-13 DIAGNOSIS — M25562 Pain in left knee: Secondary | ICD-10-CM | POA: Insufficient documentation

## 2017-06-13 DIAGNOSIS — K50018 Crohn's disease of small intestine with other complication: Secondary | ICD-10-CM

## 2017-06-13 DIAGNOSIS — M25462 Effusion, left knee: Secondary | ICD-10-CM | POA: Insufficient documentation

## 2017-06-13 MED ORDER — VEDOLIZUMAB 300 MG IV SOLR
300.0000 mg | Freq: Once | INTRAVENOUS | Status: AC
  Administered 2017-06-13: 300 mg via INTRAVENOUS
  Filled 2017-06-13: qty 5

## 2017-06-13 MED ORDER — SODIUM CHLORIDE 0.9 % IV SOLN
Freq: Once | INTRAVENOUS | Status: AC
  Administered 2017-06-13: 11:00:00 via INTRAVENOUS

## 2017-06-16 ENCOUNTER — Telehealth: Payer: Self-pay | Admitting: *Deleted

## 2017-06-16 ENCOUNTER — Other Ambulatory Visit: Payer: Self-pay | Admitting: *Deleted

## 2017-06-16 DIAGNOSIS — K50018 Crohn's disease of small intestine with other complication: Secondary | ICD-10-CM

## 2017-06-16 NOTE — Telephone Encounter (Signed)
Reviewed results for Entyvio, has undetectable Ab and drug trough? Will check peak level for drug, last infusion on 06/13/17. If has low drug level, will need to increase the dosing and also reduce the interval for infusion.

## 2017-06-16 NOTE — Telephone Encounter (Signed)
Dr Silverio Decamp the results are Undetectable for the [facility] labs from [facility]     Her last Weyman Rodney was done on 06/13/2017

## 2017-06-16 NOTE — Telephone Encounter (Signed)
Called patient and informed to come in on Monday 4/15 and ask for Beth to get test for [facility] and order forms

## 2017-06-19 ENCOUNTER — Other Ambulatory Visit: Payer: BLUE CROSS/BLUE SHIELD

## 2017-06-19 DIAGNOSIS — K5090 Crohn's disease, unspecified, without complications: Secondary | ICD-10-CM | POA: Diagnosis not present

## 2017-06-22 DIAGNOSIS — Z79899 Other long term (current) drug therapy: Secondary | ICD-10-CM | POA: Diagnosis not present

## 2017-06-22 DIAGNOSIS — G629 Polyneuropathy, unspecified: Secondary | ICD-10-CM | POA: Diagnosis not present

## 2017-06-22 DIAGNOSIS — M5137 Other intervertebral disc degeneration, lumbosacral region: Secondary | ICD-10-CM | POA: Diagnosis not present

## 2017-06-22 DIAGNOSIS — M47817 Spondylosis without myelopathy or radiculopathy, lumbosacral region: Secondary | ICD-10-CM | POA: Diagnosis not present

## 2017-06-22 DIAGNOSIS — Z79891 Long term (current) use of opiate analgesic: Secondary | ICD-10-CM | POA: Diagnosis not present

## 2017-06-22 DIAGNOSIS — G894 Chronic pain syndrome: Secondary | ICD-10-CM | POA: Diagnosis not present

## 2017-06-29 ENCOUNTER — Other Ambulatory Visit: Payer: Self-pay

## 2017-06-29 ENCOUNTER — Telehealth: Payer: Self-pay

## 2017-06-29 DIAGNOSIS — K50018 Crohn's disease of small intestine with other complication: Secondary | ICD-10-CM

## 2017-06-29 DIAGNOSIS — K5080 Crohn's disease of both small and large intestine without complications: Secondary | ICD-10-CM

## 2017-06-29 NOTE — Telephone Encounter (Signed)
Entyvio drug trough was undetectable and drug peak was 30.9 with undetectable antibodies. Will switch the infusion to every 4 weeks and recheck drug trough in 2 months.

## 2017-06-29 NOTE — Telephone Encounter (Signed)
Spoke with the patient regarding the results from her serum Vedolizumab levels. Per Dr Silverio Decamp the infusions should be increased to every 4 weeks from the present every 8 week. Patient agrees to the recommendations. New orders put into Epic. Message sent to [facility]. Ambulatory referral in Epic. Unsure if this will require a new authorization through her insurance.  Patient has an appointment for hospital follow up on 07/07/17. She asks to keep this appointment stating she is due an endoscopy study.

## 2017-06-29 NOTE — Telephone Encounter (Signed)
Next infusion will be on 07/14/17 at 11:00 am. The patient is notified and accepts this appointment time.

## 2017-07-07 ENCOUNTER — Ambulatory Visit: Payer: BLUE CROSS/BLUE SHIELD | Admitting: Gastroenterology

## 2017-07-07 ENCOUNTER — Ambulatory Visit (INDEPENDENT_AMBULATORY_CARE_PROVIDER_SITE_OTHER): Payer: BLUE CROSS/BLUE SHIELD | Admitting: Gastroenterology

## 2017-07-07 ENCOUNTER — Encounter: Payer: Self-pay | Admitting: Gastroenterology

## 2017-07-07 VITALS — BP 150/86 | HR 104 | Ht 67.5 in | Wt 169.5 lb

## 2017-07-07 DIAGNOSIS — K909 Intestinal malabsorption, unspecified: Secondary | ICD-10-CM

## 2017-07-07 DIAGNOSIS — R197 Diarrhea, unspecified: Secondary | ICD-10-CM | POA: Diagnosis not present

## 2017-07-07 DIAGNOSIS — K219 Gastro-esophageal reflux disease without esophagitis: Secondary | ICD-10-CM | POA: Diagnosis not present

## 2017-07-07 DIAGNOSIS — K50018 Crohn's disease of small intestine with other complication: Secondary | ICD-10-CM | POA: Diagnosis not present

## 2017-07-07 DIAGNOSIS — R112 Nausea with vomiting, unspecified: Secondary | ICD-10-CM | POA: Diagnosis not present

## 2017-07-07 DIAGNOSIS — K648 Other hemorrhoids: Secondary | ICD-10-CM | POA: Diagnosis not present

## 2017-07-07 DIAGNOSIS — K8689 Other specified diseases of pancreas: Secondary | ICD-10-CM

## 2017-07-07 MED ORDER — HYDROCORTISONE ACE-PRAMOXINE 1-1 % RE CREA: 1.0000 "application " | TOPICAL_CREAM | Freq: Two times a day (BID) | RECTAL | 1 refills | Status: AC

## 2017-07-07 MED ORDER — PROMETHAZINE-DM 6.25-15 MG/5ML PO SYRP: 2.5000 mL | ORAL_SOLUTION | Freq: Two times a day (BID) | ORAL | 1 refills | Status: DC | PRN

## 2017-07-07 MED ORDER — COLESTIPOL HCL 1 G PO TABS: 1.0000 g | ORAL_TABLET | Freq: Three times a day (TID) | ORAL | 6 refills | Status: AC

## 2017-07-07 NOTE — Patient Instructions (Signed)
We have sent a refill of Colestid to your pharmacy  We have sent phenergan liquid to your pharmacy  Discontinue Zofran  If you are age 58 or older, your body mass index should be between 23-30. Your Body mass index is 26.16 kg/m. If this is out of the aforementioned range listed, please consider follow up with your Primary Care Provider.  If you are age 34 or younger, your body mass index should be between 19-25. Your Body mass index is 26.16 kg/m. If this is out of the aformentioned range listed, please consider follow up with your Primary Care Provider.

## 2017-07-07 NOTE — Progress Notes (Addendum)
[name]    [id number]    [birth year]  Primary Care Physician:Cox, Elnita Maxwell, MD  Referring Physician: Rochel Brome, MD [address] [place], [place] [address]  Chief complaint: Nausea and vomiting, Crohn's disease  HPI: 57 year old female with history of Crohn's disease with both small bowel and colon involvement, status post terminal ileal resection with ileocolonic anastomosis, pancreatic insufficiency, gastroparesis on chronic narcotics is here for follow-up visit Patient had a spinal cord stimulator placed and has significant improvement of back and hip pain.  She is able to go down on narcotic dose.  She is continued to have intermittent vomiting about 2 or 3 times a week.  Able to tolerate better p.o. intake and has gained weight since last visit.  Diarrhea has also improved with Creon and Colestid.  She is having 3-4 semi-formed bowel movements daily.  Denies any rectal bleeding. Entyvio drug trough was undetectable and drug peak level was 31 with undetectable antibodies, switch Entyvio infusion to every 4 weeks and she is due for next infusion May 9    Outpatient Encounter Medications as of 07/07/2017  Medication Sig  . acetaminophen (TYLENOL) 325 MG tablet Take 2 tablets (650 mg total) by mouth every 6 (six) hours as needed for mild pain (or Fever >/= 101).  Marland Kitchen albuterol (PROAIR HFA) 108 (90 BASE) MCG/ACT inhaler Inhale 2 puffs into the lungs every 4 (four) hours as needed for wheezing or shortness of breath.   . budesonide-formoterol (SYMBICORT) 80-4.5 MCG/ACT inhaler Inhale 2 puffs into the lungs daily as needed (SOB, wheezing).   . clobetasol cream (TEMOVATE) 7.74 % Apply 1 application topically 2 (two) times daily.   . colestipol (COLESTID) 1 g tablet Take 1 tablet (1 g total) by mouth 2 (two) times daily.  . cyanocobalamin (,VITAMIN B-12,) 1000 MCG/ML injection Inject 1 mL (1,000 mcg total) into the skin every 30 (thirty) days.  Marland Kitchen dicyclomine  (BENTYL) 10 MG capsule Take 1 capsule (10 mg total) by mouth every 6 (six) hours as needed for spasms.  Marland Kitchen econazole nitrate 1 % cream APPLY  CREAM TOPICALLY ONCE DAILY  . gabapentin (NEURONTIN) 400 MG capsule Take 1 capsule by mouth at lunch, 2 at dinner and 3 at bedtime  . lipase/protease/amylase (CREON) 36000 UNITS CPEP capsule Take 1 capsule (36,000 Units total) by mouth 3 (three) times daily before meals.  . metoCLOPramide (REGLAN) 5 MG tablet Take 1 tablet (5 mg total) by mouth 3 (three) times daily before meals.  . Needles & Syringes MISC 1 Syringe by Does not apply route every 30 (thirty) days.  Marland Kitchen omeprazole (PRILOSEC) 40 MG capsule Take 1 capsule (40 mg total) by mouth daily.  . ondansetron (ZOFRAN) 4 MG tablet Take 4 mg by mouth every 8 (eight) hours as needed for nausea or vomiting.   . Oxycodone HCl 10 MG TABS Take 10 mg by mouth 5 (five) times daily as needed (pain). 1 tablet up to five times a day as needed for severe pain.   . promethazine (PHENERGAN) 25 MG suppository Place 1 suppository (25 mg total) rectally every 6 (six) hours as needed for nausea or vomiting.  . promethazine (PHENERGAN) 25 MG tablet TAKE 1 TABLET BY MOUTH EVERY 6 HOURS AS NEEDED FOR NAUSEA AND VOMITING  . propranolol ER (INDERAL LA) 80 MG 24 hr capsule Take 1 capsule by mouth daily.  Marland Kitchen tiZANidine (ZANAFLEX) 4 MG tablet Take 4 mg by mouth  every 8 (eight) hours as needed for muscle spasms.   Marland Kitchen topiramate (TOPAMAX) 50 MG tablet TAKE ONE TABLET BY MOUTH AT BEDTIME  . traZODone (DESYREL) 50 MG tablet Take 50 mg by mouth at bedtime.   Marland Kitchen zolmitriptan (ZOMIG) 5 MG nasal solution 1 spray in one nostril.  May repeat 1 spray once after 2 hours if needed. Not to exceed 2 sprays in 24 hours (Patient taking differently: Place 1 spray into the nose every 2 (two) hours as needed for migraine. 1 spray in one nostril.  May repeat 1 spray once after 2 hours if needed. Not to exceed 2 sprays in 24 hours)   No facility-administered  encounter medications on file as of 07/07/2017.     Allergies as of 07/07/2017 - Review Complete 07/07/2017  Allergen Reaction Noted  . Codeine Hives and Nausea And Vomiting 08/31/2007  . Humira [adalimumab] Rash 12/24/2013    Past Medical History:  Diagnosis Date  . Arthritis   . Chronic diarrhea   . Chronic disease anemia   . Crohn's disease ([place])   . Depression   . Family history of malignant neoplasm of gastrointestinal tract   . GERD (gastroesophageal reflux disease)   . H/O hiatal hernia   . History of adenomatous polyp of colon   . History of gastritis    AND HX ILEITIS  . History of kidney stones   . History of small bowel obstruction    SECONDARY CROHN'S STRICTURE  S/P COLECTOMY  . Hyperlipidemia   . Hypertension   . Hypopotassemia   . Internal hemorrhoids   . Kidney stones   . Pancreatic insufficiency   . PONV (postoperative nausea and vomiting)   . Psoriasis    SKIN  . Right ureteral stone   . Rotavirus infection 05/31/2013  . S/P dilatation of esophageal stricture   . Seasonal asthma   . Urgency of urination   . Vitamin B12 deficiency     Past Surgical History:  Procedure Laterality Date  . ANAL FISSURE REPAIR  1990's  . CARPAL TUNNEL RELEASE Right 2000  . CHOLECYSTECTOMY  2002  . CYSTOSCOPY WITH RETROGRADE PYELOGRAM, URETEROSCOPY AND STENT PLACEMENT Left 03/08/2013   Procedure: CYSTOSCOPY WITH RETROGRADE PYELOGRAM, URETEROSCOPY AND STENT PLACEMENT stone basketing;  Surgeon: Alexis Frock, MD;  Location: [facility];  Service: Urology;  Laterality: Left;  . CYSTOSCOPY WITH RETROGRADE PYELOGRAM, URETEROSCOPY AND STENT PLACEMENT Right 04/03/2013   Procedure: CYSTOSCOPY WITH RETROGRADE PYELOGRAM, URETEROSCOPY AND STENT PLACEMENT;  Surgeon: Alexis Frock, MD;  Location: [facility];  Service: Urology;  Laterality: Right;  . DX LAPAROSCOPY CONVERTED TO OPEN RIGHT COLECCTOMY  09-15-2010   ANASTOMOTIC STRICTURE  . LAPAROSCOPIC ILEOCECECTOMY  10-09-2008     AND APPENDECTOMY (TERMINAL ILEITIS & PARTIAL SBO)  . SPINAL CORD STIMULATOR IMPLANT Left 05/2017  . VAGINAL HYSTERECTOMY  1992    Family History  Problem Relation Age of Onset  . Colon cancer Mother   . Colon polyps Mother   . Colon polyps Father   . Lung cancer Father   . Breast cancer Paternal Grandmother   . Colon polyps Brother   . Thyroid disease Neg Hx   . Stomach cancer Neg Hx   . Pancreatic cancer Neg Hx     Social History   Socioeconomic History  . Marital status: Married    Spouse name: Not on file  . Number of children: 2  . Years of education: Not on file  . Highest education level: Not  on file  Occupational History  . Occupation: unemployed    Fish farm manager: [employer]  Social Needs  . Financial resource strain: Not on file  . Food insecurity:    Worry: Not on file    Inability: Not on file  . Transportation needs:    Medical: Not on file    Non-medical: Not on file  Tobacco Use  . Smoking status: Former Smoker    Packs/day: 1.00    Years: 10.00    Pack years: 10.00    Types: Cigarettes    Last attempt to quit: 03/07/1984    Years since quitting: 33.3  . Smokeless tobacco: Never Used  Substance and Sexual Activity  . Alcohol use: No    Alcohol/week: 0.0 oz  . Drug use: No  . Sexual activity: Not on file  Lifestyle  . Physical activity:    Days per week: Not on file    Minutes per session: Not on file  . Stress: Not on file  Relationships  . Social connections:    Talks on phone: Not on file    Gets together: Not on file    Attends religious service: Not on file    Active member of club or organization: Not on file    Attends meetings of clubs or organizations: Not on file    Relationship status: Not on file  . Intimate partner violence:    Fear of current or ex partner: Not on file    Emotionally abused: Not on file    Physically abused: Not on file    Forced sexual activity: Not on file  Other Topics Concern  . Not on file  Social  History Narrative  . Not on file      Review of systems: Review of Systems  Constitutional: Negative for fever and chills.  HENT: Negative.   Eyes: Negative for blurred vision.  Respiratory: Negative for cough, shortness of breath and wheezing.   Cardiovascular: Negative for chest pain and palpitations.  Gastrointestinal: as per HPI Genitourinary: Negative for dysuria, urgency, frequency and hematuria.  Musculoskeletal: Positive for myalgias, back pain and joint pain.  Skin: Negative for itching and rash.  Neurological: Negative for dizziness, tremors, focal weakness, seizures and loss of consciousness.  Endo/Heme/Allergies: Positive for seasonal allergies.  Psychiatric/Behavioral: Negative for depression, suicidal ideas and hallucinations. Positive for anxiety All other systems reviewed and are negative.   Physical Exam: Vitals:   07/07/17 1018  BP: (!) 150/86  Pulse: (!) 104   Body mass index is 26.16 kg/m. Gen:      No acute distress HEENT:  EOMI, sclera anicteric Neck:     No masses; no thyromegaly Lungs:    Clear to auscultation bilaterally; normal respiratory effort CV:         Regular rate and rhythm; no murmurs Abd:      + bowel sounds; soft, non-tender; no palpable masses, no distension Ext:    No edema; adequate peripheral perfusion Skin:      Warm and dry; no rash Neuro: alert and oriented x 3 Psych: normal mood and affect  Data Reviewed:  Reviewed labs, radiology imaging, old records and pertinent past GI work up   Assessment and Plan/Recommendations:  57 year old female with history of Crohn's disease with involvement of colon and small bowel status post TI resection with ileocolonic anastomosis On Entyvio, undetectable drug trough and antibody level at 8 weeks, drug peak 31.5 Switched Entyvio infusion to every 4 weeks We will recheck drug  trough after patient has received 2 infusions 4 weeks  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 Derrill [facility] for further management of intractable nausea and vomiting  GERD: Continue omeprazole and antireflux measures  Hemorrhoids: Okay to use hydrocortisone cream per rectum as needed Avoid excessive straining Increase fluid intake  Greater than 50% of the time used for counseling as well as treatment plan and follow-up. She had multiple questions which were answered to her satisfaction  K. Denzil Magnuson , MD [phone]    CC: Rochel Brome, MD

## 2017-07-10 ENCOUNTER — Other Ambulatory Visit (HOSPITAL_COMMUNITY): Payer: Self-pay | Admitting: General Practice

## 2017-07-10 ENCOUNTER — Other Ambulatory Visit: Payer: Self-pay | Admitting: *Deleted

## 2017-07-10 DIAGNOSIS — M5136 Other intervertebral disc degeneration, lumbar region: Secondary | ICD-10-CM | POA: Diagnosis not present

## 2017-07-10 DIAGNOSIS — G894 Chronic pain syndrome: Secondary | ICD-10-CM | POA: Diagnosis not present

## 2017-07-10 DIAGNOSIS — M25561 Pain in right knee: Secondary | ICD-10-CM | POA: Diagnosis not present

## 2017-07-10 DIAGNOSIS — M79651 Pain in right thigh: Secondary | ICD-10-CM | POA: Diagnosis not present

## 2017-07-10 MED ORDER — HYDROCORTISONE 2.5 % RE CREA: 1.0000 "application " | TOPICAL_CREAM | Freq: Two times a day (BID) | RECTAL | 1 refills | Status: DC

## 2017-07-11 ENCOUNTER — Other Ambulatory Visit: Payer: Self-pay

## 2017-07-11 DIAGNOSIS — K50018 Crohn's disease of small intestine with other complication: Secondary | ICD-10-CM

## 2017-07-14 ENCOUNTER — Encounter (HOSPITAL_COMMUNITY): Payer: Self-pay

## 2017-07-14 ENCOUNTER — Encounter (HOSPITAL_COMMUNITY)
Admission: RE | Admit: 2017-07-14 | Discharge: 2017-07-14 | Disposition: A | Discharge status: Home / Self Care | Payer: BLUE CROSS/BLUE SHIELD | Source: Ambulatory Visit | Attending: Gastroenterology | Admitting: Gastroenterology

## 2017-07-14 DIAGNOSIS — K50119 Crohn's disease of large intestine with unspecified complications: Secondary | ICD-10-CM | POA: Diagnosis not present

## 2017-07-14 DIAGNOSIS — K50018 Crohn's disease of small intestine with other complication: Secondary | ICD-10-CM

## 2017-07-14 MED ORDER — VEDOLIZUMAB 300 MG IV SOLR
300.0000 mg | Freq: Once | INTRAVENOUS | Status: AC
  Administered 2017-07-14: 300 mg via INTRAVENOUS
  Filled 2017-07-14: qty 5

## 2017-07-14 MED ORDER — SODIUM CHLORIDE 0.9 % IV SOLN
INTRAVENOUS | Status: AC
  Administered 2017-07-14: 11:00:00 via INTRAVENOUS

## 2017-07-14 NOTE — Discharge Instructions (Signed)
Entyvio Vedolizumab injection solution What is this medicine? VEDOLIZUMAB (Ve doe LIZ you mab) is used to treat ulcerative colitis and Crohn's disease in adult patients. This medicine may be used for other purposes; ask your health care provider or pharmacist if you have questions. COMMON BRAND NAME(S): Entyvio What should I tell my health care provider before I take this medicine? They need to know if you have any of these conditions: -diabetes -hepatitis B or history of hepatitis B infection -HIV or AIDS -immune system problems -infection or history of infections -liver disease -recently received or scheduled to receive a vaccine -scheduled to have surgery -tuberculosis, a positive skin test for tuberculosis or have recently been in close contact with someone who has tuberculosis - an unusual or allergic reaction to vedolizumab, other medicines, foods, dyes, or preservatives -pregnant or trying to get pregnant -breast-feeding How should I use this medicine? This medicine is for infusion into a vein. It is given by a health care professional in a hospital or clinic setting. A special MedGuide will be given to you by the pharmacist with each prescription and refill. Be sure to read this information carefully each time. Talk to your pediatrician regarding the use of this medicine in children. This medicine is not approved for use in children. Overdosage: If you think you have taken too much of this medicine contact a poison control center or emergency room at once. NOTE: This medicine is only for you. Do not share this medicine with others. What if I miss a dose? It is important not to miss your dose. Call your doctor or health care professional if you are unable to keep an appointment. What may interact with this medicine? -steroid medicines like prednisone or cortisone -TNF-alpha inhibitors like natalizumab, adalimumab, and infliximab -vaccines This list may not describe all  possible interactions. Give your health care provider a list of all the medicines, herbs, non-prescription drugs, or dietary supplements you use. Also tell them if you smoke, drink alcohol, or use illegal drugs. Some items may interact with your medicine. What should I watch for while using this medicine? Your condition will be monitored carefully while you are receiving this medicine. Visit your doctor for regular check ups. Tell your doctor or healthcare professional if your symptoms do not start to get better or if they get worse. Stay away from people who are sick. Call your doctor or health care professional for advice if you get a fever, chills or sore throat, or other symptoms of a cold or flu. Do not treat yourself. In some patients, this medicine may cause a serious brain infection that may cause death. If you have any problems seeing, thinking, speaking, walking, or standing, tell your doctor right away. If you cannot reach your doctor, get urgent medical care. What side effects may I notice from receiving this medicine? Side effects that you should report to your doctor or health care professional as soon as possible: -allergic reactions like skin rash, itching or hives, swelling of the face, lips, or tongue -breathing problems -changes in vision -chest pain -dark urine -depression, feelings of sadness -dizziness -general ill feeling or flu-like symptoms -irregular, missed, or painful menstrual periods -light-colored stools -loss of appetite, nausea -muscle weakness -problems with balance, talking, or walking -right upper belly pain -unusually weak or tired -yellowing of the eyes or skin Side effects that usually do not require medical attention (report to your doctor or health care professional if they continue or are bothersome): -aches, pains -  headache -stomach upset -tiredness This list may not describe all possible side effects. Call your doctor for medical advice about  side effects. You may report side effects to FDA at 1-800-FDA-1088. Where should I keep my medicine? This drug is given in a hospital or clinic and will not be stored at home. NOTE: This sheet is a summary. It may not cover all possible information. If you have questions about this medicine, talk to your doctor, pharmacist, or health care provider.  2018 Elsevier/Gold Standard (2015-03-26 08:36:51)

## 2017-07-20 DIAGNOSIS — M25462 Effusion, left knee: Secondary | ICD-10-CM | POA: Diagnosis not present

## 2017-07-20 DIAGNOSIS — Z79899 Other long term (current) drug therapy: Secondary | ICD-10-CM | POA: Diagnosis not present

## 2017-07-20 DIAGNOSIS — G894 Chronic pain syndrome: Secondary | ICD-10-CM | POA: Diagnosis not present

## 2017-07-20 DIAGNOSIS — Z79891 Long term (current) use of opiate analgesic: Secondary | ICD-10-CM | POA: Diagnosis not present

## 2017-08-01 ENCOUNTER — Encounter: Payer: Self-pay | Admitting: Nurse Practitioner

## 2017-08-01 ENCOUNTER — Ambulatory Visit (INDEPENDENT_AMBULATORY_CARE_PROVIDER_SITE_OTHER): Payer: BLUE CROSS/BLUE SHIELD | Admitting: Nurse Practitioner

## 2017-08-01 VITALS — BP 130/86 | HR 76 | Ht 67.0 in | Wt 178.0 lb

## 2017-08-01 DIAGNOSIS — K529 Noninfective gastroenteritis and colitis, unspecified: Secondary | ICD-10-CM | POA: Diagnosis not present

## 2017-08-01 NOTE — Progress Notes (Signed)
IMPRESSION and PLAN:    57 year old female with small and large bowel disease followed by Dr. Silverio Decamp, maintained on [place] [facility].   Recent Entyvio trough level undetectable, infusion interval decreased to  Q 4 week dosing.   -Overall she does well except for episodic nausea, vomiting , increased frequency of loose stools for unclear reasons. She had another episode over the weekend, slowly recovering.  -Next infusion of Entyvio due around June 9th, she will come day before (or morning of infusion) for repeat trough level.     HPI:    Chief Complaint: follow up on Crohn's   Patient is a 57 year old female followed by Dr. Silverio Decamp for large and small bowel Crohn's, maintained on Entyvio.  Other GI problems include pancreatic insufficiency placement enzymes, gastroparesis on chronic narcotics.  Patient was seen last on the third of this month for evaluation of nausea and vomiting.  Her episodes of nausea or vomiting usually occur in association with increased amounts of loose stool.  At the time of her recent visit patient had gained weight and diarrhea had improved with pancreatic enzymes and Colestid.Entyvio trough level was undetectable, peak level 31 with undetectable antibodies.  Infusion frequency increased to every 4 weeks.  Patient is here for follow-up.  Ms. Yazdani was doing well until over the weekend when she had recurrent nausea / vomiting and increased frequency of loose stools.  She feels bloated. On Saturday she was unable to hold anything down. By Sunday she could tolerated some broth. Still not much of an appetite but tolerating liquids. She has been using Phenergan prn , also on scheduled Reglan.  Her next infusion of [facility]ney is somewhere around June 9  Review of systems:  Some fatigue.  No chest pain, shortness of breath, or urinary problems.    Past Medical History:  Diagnosis Date  . Arthritis   . Chronic diarrhea   . Chronic disease anemia   . Crohn's disease ([place])    . Depression   . Family history of malignant neoplasm of gastrointestinal tract   . GERD (gastroesophageal reflux disease)   . H/O hiatal hernia   . History of adenomatous polyp of colon   . History of gastritis    AND HX ILEITIS  . History of kidney stones   . History of small bowel obstruction    SECONDARY CROHN'S STRICTURE  S/P COLECTOMY  . Hyperlipidemia   . Hypertension   . Hypopotassemia   . Internal hemorrhoids   . Kidney stones   . Pancreatic insufficiency   . PONV (postoperative nausea and vomiting)   . Psoriasis    SKIN  . Right ureteral stone   . Rotavirus infection 05/31/2013  . S/P dilatation of esophageal stricture   . Seasonal asthma   . Urgency of urination   . Vitamin B12 deficiency     Patient's surgical history, family medical history, social history, medications and allergies were all reviewed in Epic    Physical Exam:     BP 130/86   Pulse 76   Ht 5' 7"  (1.702 m)   Wt 178 lb (80.7 kg)   BMI 27.88 kg/m   GENERAL: Well-developed white female in NAD PSYCH: :Pleasant, cooperative, normal affect EENT:  conjunctiva pink, mucous membranes moist, neck supple without masses CARDIAC:  RRR, no peripheral edema PULM: Normal respiratory effort, lungs CTA bilaterally, no wheezing ABDOMEN:  Nondistended, soft, nontender. No obvious masses, no hepatomegaly,  hypoactivel bowel sounds SKIN:  turgor, no lesions  seen Musculoskeletal:  Normal muscle tone, normal strength NEURO: Alert and oriented x 3, no focal neurologic deficits   Tye Savoy , NP 08/01/2017, 3:26 PM

## 2017-08-01 NOTE — Patient Instructions (Addendum)
If you are age 58 or older, your body mass index should be between 23-30. Your Body mass index is 27.88 kg/m. If this is out of the aforementioned range listed, please consider follow up with your Primary Care Provider.  If you are age 68 or younger, your body mass index should be between 19-25. Your Body mass index is 27.88 kg/m. If this is out of the aformentioned range listed, please consider follow up with your Primary Care Provider.   Your provider has requested that you go to the basement level for lab work before leaving today. Press "B" on the elevator. The lab is located at the first door on the left as you exit the elevator. Have lab done on June 10 th at 7:30 a.m. Prior to infusion. ICE PACK MUST BE FROZEN!  Come to the office before going to lab in the basement to get kit and ice pack.  Follow up with Dr. Silverio Decamp in six months.  We will contact you with an appointment.  Thank you for choosing me and [facility].   Tye Savoy, NP

## 2017-08-02 ENCOUNTER — Encounter: Payer: Self-pay | Admitting: Nurse Practitioner

## 2017-08-03 ENCOUNTER — Other Ambulatory Visit: Payer: Self-pay | Admitting: *Deleted

## 2017-08-03 MED ORDER — PANCRELIPASE (LIP-PROT-AMYL) 36000-114000 UNITS PO CPEP: 36000.0000 [IU] | ORAL_CAPSULE | Freq: Three times a day (TID) | ORAL | 3 refills | Status: DC

## 2017-08-08 ENCOUNTER — Encounter (HOSPITAL_COMMUNITY): Payer: BLUE CROSS/BLUE SHIELD

## 2017-08-09 ENCOUNTER — Other Ambulatory Visit (HOSPITAL_COMMUNITY): Payer: Self-pay | Admitting: General Practice

## 2017-08-09 ENCOUNTER — Other Ambulatory Visit: Payer: Self-pay

## 2017-08-09 DIAGNOSIS — K50018 Crohn's disease of small intestine with other complication: Secondary | ICD-10-CM

## 2017-08-09 NOTE — Progress Notes (Signed)
Reviewed and agree with documentation and assessment and plan. K. Veena Nandigam , MD   

## 2017-08-10 ENCOUNTER — Other Ambulatory Visit: Payer: Self-pay

## 2017-08-10 DIAGNOSIS — K50018 Crohn's disease of small intestine with other complication: Secondary | ICD-10-CM

## 2017-08-11 ENCOUNTER — Encounter (HOSPITAL_COMMUNITY): Payer: BLUE CROSS/BLUE SHIELD

## 2017-08-11 ENCOUNTER — Other Ambulatory Visit (HOSPITAL_COMMUNITY): Payer: Self-pay | Admitting: General Practice

## 2017-08-14 ENCOUNTER — Encounter (HOSPITAL_COMMUNITY): Payer: Self-pay

## 2017-08-14 ENCOUNTER — Encounter (HOSPITAL_COMMUNITY)
Admission: RE | Admit: 2017-08-14 | Discharge: 2017-08-14 | Disposition: A | Discharge status: Home / Self Care | Payer: BLUE CROSS/BLUE SHIELD | Source: Ambulatory Visit | Attending: Gastroenterology | Admitting: Gastroenterology

## 2017-08-14 DIAGNOSIS — K50119 Crohn's disease of large intestine with unspecified complications: Secondary | ICD-10-CM | POA: Diagnosis not present

## 2017-08-14 DIAGNOSIS — K50018 Crohn's disease of small intestine with other complication: Secondary | ICD-10-CM

## 2017-08-14 MED ORDER — SODIUM CHLORIDE 0.9 % IV SOLN
INTRAVENOUS | Status: AC
Start: 2017-08-14 — End: 2017-08-14
  Administered 2017-08-14: 08:00:00 via INTRAVENOUS

## 2017-08-14 MED ORDER — VEDOLIZUMAB 300 MG IV SOLR
300.0000 mg | Freq: Once | INTRAVENOUS | Status: AC
  Administered 2017-08-14: 300 mg via INTRAVENOUS
  Filled 2017-08-14: qty 5

## 2017-08-17 ENCOUNTER — Telehealth: Payer: Self-pay | Admitting: Gastroenterology

## 2017-08-17 ENCOUNTER — Other Ambulatory Visit: Payer: Self-pay | Admitting: Gastroenterology

## 2017-08-17 MED ORDER — PANCRELIPASE (LIP-PROT-AMYL) 36000-114000 UNITS PO CPEP: ORAL_CAPSULE | ORAL | 11 refills | Status: DC

## 2017-08-17 NOTE — Telephone Encounter (Signed)
Called [facility] with dose change of prescription and sent in electronically to [facility]  Patient was only sent 90 pills that will not last her even 10 days     Correct prescription is  2 by mouth daily with meals and 1 with snacks

## 2017-08-22 ENCOUNTER — Telehealth: Payer: Self-pay | Admitting: *Deleted

## 2017-08-22 NOTE — Telephone Encounter (Signed)
Day before next infusion of Entyvio

## 2017-08-22 NOTE — Telephone Encounter (Signed)
Dr Silverio Decamp   Patient was suppose to come get lab kit [facility] on the morning of 6/10 before her Entyvio injection  Peter Congo spoke with the patient and her husband about this but it seems she never picked up kit.... When should she have this test done again?? Patient seen [name]

## 2017-08-23 DIAGNOSIS — M25462 Effusion, left knee: Secondary | ICD-10-CM | POA: Diagnosis not present

## 2017-08-23 DIAGNOSIS — M47817 Spondylosis without myelopathy or radiculopathy, lumbosacral region: Secondary | ICD-10-CM | POA: Diagnosis not present

## 2017-08-23 DIAGNOSIS — Z79891 Long term (current) use of opiate analgesic: Secondary | ICD-10-CM | POA: Diagnosis not present

## 2017-08-23 DIAGNOSIS — Z79899 Other long term (current) drug therapy: Secondary | ICD-10-CM | POA: Diagnosis not present

## 2017-08-23 DIAGNOSIS — M545 Low back pain: Secondary | ICD-10-CM | POA: Diagnosis not present

## 2017-08-23 DIAGNOSIS — G894 Chronic pain syndrome: Secondary | ICD-10-CM | POA: Diagnosis not present

## 2017-08-24 DIAGNOSIS — R946 Abnormal results of thyroid function studies: Secondary | ICD-10-CM | POA: Diagnosis not present

## 2017-08-24 DIAGNOSIS — Z23 Encounter for immunization: Secondary | ICD-10-CM | POA: Diagnosis not present

## 2017-08-24 DIAGNOSIS — I10 Essential (primary) hypertension: Secondary | ICD-10-CM | POA: Diagnosis not present

## 2017-08-25 ENCOUNTER — Other Ambulatory Visit: Payer: Self-pay

## 2017-08-25 ENCOUNTER — Other Ambulatory Visit (HOSPITAL_COMMUNITY): Payer: Self-pay | Admitting: General Practice

## 2017-08-25 DIAGNOSIS — K50018 Crohn's disease of small intestine with other complication: Secondary | ICD-10-CM

## 2017-09-06 NOTE — Telephone Encounter (Signed)
Ok thanks 

## 2017-09-06 NOTE — Telephone Encounter (Signed)
[name] has she picked up a new kit for next Entyvio? Thanks

## 2017-09-06 NOTE — Telephone Encounter (Signed)
No. I have left a message on both the home phone and the mobile reminding her to come today or Friday.

## 2017-09-08 ENCOUNTER — Encounter: Payer: Self-pay | Admitting: Nurse Practitioner

## 2017-09-11 ENCOUNTER — Encounter (HOSPITAL_COMMUNITY)
Admission: RE | Admit: 2017-09-11 | Discharge: 2017-09-11 | Disposition: A | Discharge status: Home / Self Care | Payer: BLUE CROSS/BLUE SHIELD | Source: Ambulatory Visit | Attending: Gastroenterology | Admitting: Gastroenterology

## 2017-09-11 ENCOUNTER — Encounter (HOSPITAL_COMMUNITY): Payer: Self-pay

## 2017-09-11 DIAGNOSIS — K50018 Crohn's disease of small intestine with other complication: Secondary | ICD-10-CM

## 2017-09-11 DIAGNOSIS — K50119 Crohn's disease of large intestine with unspecified complications: Secondary | ICD-10-CM | POA: Insufficient documentation

## 2017-09-11 MED ORDER — SODIUM CHLORIDE 0.9 % IV SOLN
INTRAVENOUS | Status: DC
  Administered 2017-09-11: 08:00:00 via INTRAVENOUS

## 2017-09-11 MED ORDER — SODIUM CHLORIDE 0.9 % IV SOLN
300.0000 mg | INTRAVENOUS | Status: DC
  Administered 2017-09-11: 300 mg via INTRAVENOUS
  Filled 2017-09-11: qty 5

## 2017-09-11 NOTE — Discharge Instructions (Signed)
Vedolizumab injection solution What is this medicine? VEDOLIZUMAB (Ve doe LIZ you mab) is used to treat ulcerative colitis and Crohn's disease in adult patients. This medicine may be used for other purposes; ask your health care provider or pharmacist if you have questions. COMMON BRAND NAME(S): Entyvio What should I tell my health care provider before I take this medicine? They need to know if you have any of these conditions: -diabetes -hepatitis B or history of hepatitis B infection -HIV or AIDS -immune system problems -infection or history of infections -liver disease -recently received or scheduled to receive a vaccine -scheduled to have surgery -tuberculosis, a positive skin test for tuberculosis or have recently been in close contact with someone who has tuberculosis - an unusual or allergic reaction to vedolizumab, other medicines, foods, dyes, or preservatives -pregnant or trying to get pregnant -breast-feeding How should I use this medicine? This medicine is for infusion into a vein. It is given by a health care professional in a hospital or clinic setting. A special MedGuide will be given to you by the pharmacist with each prescription and refill. Be sure to read this information carefully each time. Talk to your pediatrician regarding the use of this medicine in children. This medicine is not approved for use in children. Overdosage: If you think you have taken too much of this medicine contact a poison control center or emergency room at once. NOTE: This medicine is only for you. Do not share this medicine with others. What if I miss a dose? It is important not to miss your dose. Call your doctor or health care professional if you are unable to keep an appointment. What may interact with this medicine? -steroid medicines like prednisone or cortisone -TNF-alpha inhibitors like natalizumab, adalimumab, and infliximab -vaccines This list may not describe all possible  interactions. Give your health care provider a list of all the medicines, herbs, non-prescription drugs, or dietary supplements you use. Also tell them if you smoke, drink alcohol, or use illegal drugs. Some items may interact with your medicine. What should I watch for while using this medicine? Your condition will be monitored carefully while you are receiving this medicine. Visit your doctor for regular check ups. Tell your doctor or healthcare professional if your symptoms do not start to get better or if they get worse. Stay away from people who are sick. Call your doctor or health care professional for advice if you get a fever, chills or sore throat, or other symptoms of a cold or flu. Do not treat yourself. In some patients, this medicine may cause a serious brain infection that may cause death. If you have any problems seeing, thinking, speaking, walking, or standing, tell your doctor right away. If you cannot reach your doctor, get urgent medical care. What side effects may I notice from receiving this medicine? Side effects that you should report to your doctor or health care professional as soon as possible: -allergic reactions like skin rash, itching or hives, swelling of the face, lips, or tongue -breathing problems -changes in vision -chest pain -dark urine -depression, feelings of sadness -dizziness -general ill feeling or flu-like symptoms -irregular, missed, or painful menstrual periods -light-colored stools -loss of appetite, nausea -muscle weakness -problems with balance, talking, or walking -right upper belly pain -unusually weak or tired -yellowing of the eyes or skin Side effects that usually do not require medical attention (report to your doctor or health care professional if they continue or are bothersome): -aches, pains -headache -  stomach upset -tiredness This list may not describe all possible side effects. Call your doctor for medical advice about side  effects. You may report side effects to FDA at 1-800-FDA-1088. Where should I keep my medicine? This drug is given in a hospital or clinic and will not be stored at home. NOTE: This sheet is a summary. It may not cover all possible information. If you have questions about this medicine, talk to your doctor, pharmacist, or health care provider.  2018 Elsevier/Gold Standard (2015-03-26 08:36:51)

## 2017-09-19 ENCOUNTER — Other Ambulatory Visit (INDEPENDENT_AMBULATORY_CARE_PROVIDER_SITE_OTHER): Payer: BLUE CROSS/BLUE SHIELD

## 2017-09-19 ENCOUNTER — Ambulatory Visit (INDEPENDENT_AMBULATORY_CARE_PROVIDER_SITE_OTHER): Payer: BLUE CROSS/BLUE SHIELD | Admitting: Gastroenterology

## 2017-09-19 ENCOUNTER — Encounter: Payer: Self-pay | Admitting: Gastroenterology

## 2017-09-19 VITALS — BP 114/62 | HR 64 | Ht 67.0 in | Wt 190.0 lb

## 2017-09-19 DIAGNOSIS — K219 Gastro-esophageal reflux disease without esophagitis: Secondary | ICD-10-CM | POA: Diagnosis not present

## 2017-09-19 DIAGNOSIS — R112 Nausea with vomiting, unspecified: Secondary | ICD-10-CM

## 2017-09-19 DIAGNOSIS — K909 Intestinal malabsorption, unspecified: Secondary | ICD-10-CM | POA: Diagnosis not present

## 2017-09-19 DIAGNOSIS — K5000 Crohn's disease of small intestine without complications: Secondary | ICD-10-CM | POA: Diagnosis not present

## 2017-09-19 DIAGNOSIS — R1310 Dysphagia, unspecified: Secondary | ICD-10-CM

## 2017-09-19 DIAGNOSIS — R197 Diarrhea, unspecified: Secondary | ICD-10-CM

## 2017-09-19 DIAGNOSIS — K8689 Other specified diseases of pancreas: Secondary | ICD-10-CM | POA: Diagnosis not present

## 2017-09-19 LAB — CBC WITH DIFFERENTIAL/PLATELET
Basophils Absolute: 0.1 K/uL (ref 0.0–0.1)
Basophils Relative: 0.6 % (ref 0.0–3.0)
Eosinophils Absolute: 0.2 K/uL (ref 0.0–0.7)
Eosinophils Relative: 2 % (ref 0.0–5.0)
HCT: 37.7 % (ref 36.0–46.0)
Hemoglobin: 12.7 g/dL (ref 12.0–15.0)
Lymphocytes Relative: 26 % (ref 12.0–46.0)
Lymphs Abs: 2.3 K/uL (ref 0.7–4.0)
MCHC: 33.5 g/dL (ref 30.0–36.0)
MCV: 88.6 fl (ref 78.0–100.0)
Monocytes Absolute: 0.9 K/uL (ref 0.1–1.0)
Monocytes Relative: 10.3 % (ref 3.0–12.0)
Neutro Abs: 5.5 K/uL (ref 1.4–7.7)
Neutrophils Relative %: 61.1 % (ref 43.0–77.0)
Platelets: 270 K/uL (ref 150.0–400.0)
RBC: 4.26 Mil/uL (ref 3.87–5.11)
RDW: 17 % — ABNORMAL HIGH (ref 11.5–15.5)
WBC: 9 K/uL (ref 4.0–10.5)

## 2017-09-19 LAB — COMPREHENSIVE METABOLIC PANEL
ALK PHOS: 124 U/L — AB (ref 39–117)
ALT: 15 U/L (ref 0–35)
AST: 15 U/L (ref 0–37)
Albumin: 4 g/dL (ref 3.5–5.2)
BUN: 15 mg/dL (ref 6–23)
CHLORIDE: 104 meq/L (ref 96–112)
CO2: 29 meq/L (ref 19–32)
Calcium: 9.3 mg/dL (ref 8.4–10.5)
Creatinine, Ser: 0.73 mg/dL (ref 0.40–1.20)
GFR: 86.97 mL/min (ref >60.00)
Glucose, Bld: 84 mg/dL (ref 70–99)
POTASSIUM: 3.6 meq/L (ref 3.5–5.1)
Sodium: 139 mEq/L (ref 135–145)
TOTAL PROTEIN: 7.5 g/dL (ref 6.0–8.3)
Total Bilirubin: 0.4 mg/dL (ref 0.2–1.2)

## 2017-09-19 LAB — FOLATE: FOLATE: 7.2 ng/mL (ref >5.9)

## 2017-09-19 LAB — VITAMIN B12: Vitamin B-12: 179 pg/mL — ABNORMAL LOW (ref 211–911)

## 2017-09-19 MED ORDER — PROMETHAZINE HCL 6.25 MG/5ML PO SYRP: 6.2500 mg | ORAL_SOLUTION | Freq: Four times a day (QID) | ORAL | 2 refills | Status: DC | PRN

## 2017-09-19 MED FILL — PROMETHAZINE 6.25 MG/5 ML S: 6.25 | 6 days supply | Qty: 120 | Fill #0

## 2017-09-19 NOTE — Progress Notes (Signed)
[name]    [id number]    Sep 01, 1960  Primary Care Physician:Cox, Elnita Maxwell, MD  Referring Physician: Rochel Brome, MD [address] [place] [address]  Chief complaint:  Dysphagia, nausea, vomiting, Crohn's disease HPI: 57 year old female with history of small bowel and colonic involvement status post terminal ileal resection with ileocolonic anastomosis. Entyvio infusion switched to every 4 weeks as patient had undetectable drug trough, undetectable antibodies and drug peak level was within normal range at 31.  She did infusion last week and drug trough is pending. She continues to have intermittent episodes of vomiting, 2-3 times a week and has nausea almost daily.  She is having 2-3 bowel movements daily on most days but after she has episodes of vomiting she has multiple loose watery bowel movements.  When she vomits she is also unable to take her medications. Patient complains of intermittent difficulty swallowing, worse in the past few weeks, mostly with solid foods. She has appointment to see Dr. Derrill Kay at [facility] next month for management of gastroparesis. Back pain is better controlled after spinal stimulator and she is able to decrease narcotics to 4 tablets daily instead of 5 tablets and wants to further decrease the medication.  She is followed by pain management clinic. She is gaining weight, denies loss of appetite, odynophagia, melena or blood per rectum.     Outpatient Encounter Medications as of 09/19/2017  Medication Sig  . albuterol (PROAIR HFA) 108 (90 BASE) MCG/ACT inhaler Inhale 2 puffs into the lungs every 4 (four) hours as needed for wheezing or shortness of breath.   . budesonide-formoterol (SYMBICORT) 80-4.5 MCG/ACT inhaler Inhale 2 puffs into the lungs daily as needed (SOB, wheezing).   . clobetasol cream (TEMOVATE) 5.36 % Apply 1 application topically 2 (two) times daily.   . colestipol (COLESTID) 1 g tablet  Take 1 tablet (1 g total) by mouth 3 (three) times daily.  . cyanocobalamin (,VITAMIN B-12,) 1000 MCG/ML injection Inject 1 mL (1,000 mcg total) into the skin every 30 (thirty) days.  Marland Kitchen dicyclomine (BENTYL) 10 MG capsule Take 1 capsule (10 mg total) by mouth every 6 (six) hours as needed for spasms.  Marland Kitchen econazole nitrate 1 % cream APPLY  CREAM TOPICALLY ONCE DAILY  . gabapentin (NEURONTIN) 400 MG capsule Take 1 capsule by mouth at lunch, 2 at dinner and 3 at bedtime  . hydrocortisone (ANUSOL-HC) 2.5 % rectal cream Place 1 application rectally 2 (two) times daily.  . lipase/protease/amylase (CREON) 36000 UNITS CPEP capsule Take 2  ( 72,000units) by mouth three times a day with meals one (36,000units) with snacks This is a dose increase for this -patient  . metoCLOPramide (REGLAN) 5 MG tablet Take 1 tablet (5 mg total) by mouth 3 (three) times daily before meals.  . Needles & Syringes MISC 1 Syringe by Does not apply route every 30 (thirty) days.  Marland Kitchen omeprazole (PRILOSEC) 40 MG capsule Take 1 capsule (40 mg total) by mouth daily.  . Oxycodone HCl 10 MG TABS Take 10 mg by mouth 5 (five) times daily as needed (pain). 1 tablet up to five times a day as needed for severe pain.   . pramoxine-hydrocortisone (PROCTOCREAM-HC) 1-1 % rectal cream Place 1 application rectally 2 (two) times daily.  . promethazine (PHENERGAN) 25 MG suppository Place 1 suppository (25 mg total) rectally every 6 (six) hours as needed for nausea or vomiting.  . promethazine (PHENERGAN)  25 MG tablet TAKE 1 TABLET BY MOUTH EVERY 6 HOURS AS NEEDED FOR NAUSEA AND VOMITING  . promethazine-dextromethorphan (PROMETHAZINE-DM) 6.25-15 MG/5ML syrup Take 2.5 mLs by mouth every 12 (twelve) hours as needed for cough.  . propranolol ER (INDERAL LA) 80 MG 24 hr capsule Take 1 capsule by mouth daily.  Marland Kitchen tiZANidine (ZANAFLEX) 4 MG tablet Take 4 mg by mouth every 8 (eight) hours as needed for muscle spasms.   . traZODone (DESYREL) 50 MG tablet Take 50  mg by mouth at bedtime.   Marland Kitchen zolmitriptan (ZOMIG) 5 MG nasal solution 1 spray in one nostril.  May repeat 1 spray once after 2 hours if needed. Not to exceed 2 sprays in 24 hours (Patient taking differently: Place 1 spray into the nose every 2 (two) hours as needed for migraine. 1 spray in one nostril.  May repeat 1 spray once after 2 hours if needed. Not to exceed 2 sprays in 24 hours)  . [DISCONTINUED] acetaminophen (TYLENOL) 325 MG tablet Take 2 tablets (650 mg total) by mouth every 6 (six) hours as needed for mild pain (or Fever >/= 101).  . [DISCONTINUED] topiramate (TOPAMAX) 50 MG tablet TAKE ONE TABLET BY MOUTH AT BEDTIME   No facility-administered encounter medications on file as of 09/19/2017.     Allergies as of 09/19/2017 - Review Complete 09/19/2017  Allergen Reaction Noted  . Codeine Hives and Nausea And Vomiting 08/31/2007  . Humira [adalimumab] Rash 12/24/2013    Past Medical History:  Diagnosis Date  . Arthritis   . Chronic diarrhea   . Chronic disease anemia   . Crohn's disease ([place])   . Depression   . Family history of malignant neoplasm of gastrointestinal tract   . GERD (gastroesophageal reflux disease)   . H/O hiatal hernia   . History of adenomatous polyp of colon   . History of gastritis    AND HX ILEITIS  . History of kidney stones   . History of small bowel obstruction    SECONDARY CROHN'S STRICTURE  S/P COLECTOMY  . Hyperlipidemia   . Hypertension   . Hypopotassemia   . Internal hemorrhoids   . Kidney stones   . Pancreatic insufficiency   . PONV (postoperative nausea and vomiting)   . Psoriasis    SKIN  . Right ureteral stone   . Rotavirus infection 05/31/2013  . S/P dilatation of esophageal stricture   . Seasonal asthma   . Urgency of urination   . Vitamin B12 deficiency     Past Surgical History:  Procedure Laterality Date  . ANAL FISSURE REPAIR  1990's  . CARPAL TUNNEL RELEASE Right 2000  . CHOLECYSTECTOMY  2002  . CYSTOSCOPY WITH  RETROGRADE PYELOGRAM, URETEROSCOPY AND STENT PLACEMENT Left 03/08/2013   Procedure: CYSTOSCOPY WITH RETROGRADE PYELOGRAM, URETEROSCOPY AND STENT PLACEMENT stone basketing;  Surgeon: Alexis Frock, MD;  Location: [facility];  Service: Urology;  Laterality: Left;  . CYSTOSCOPY WITH RETROGRADE PYELOGRAM, URETEROSCOPY AND STENT PLACEMENT Right 04/03/2013   Procedure: CYSTOSCOPY WITH RETROGRADE PYELOGRAM, URETEROSCOPY AND STENT PLACEMENT;  Surgeon: Alexis Frock, MD;  Location: [facility];  Service: Urology;  Laterality: Right;  . DX LAPAROSCOPY CONVERTED TO OPEN RIGHT COLECCTOMY  09-15-2010   ANASTOMOTIC STRICTURE  . LAPAROSCOPIC ILEOCECECTOMY  10-09-2008   AND APPENDECTOMY (TERMINAL ILEITIS & PARTIAL SBO)  . SPINAL CORD STIMULATOR IMPLANT Left 05/2017  . VAGINAL HYSTERECTOMY  1992    Family History  Problem Relation Age of Onset  . Colon cancer Mother   .  Colon polyps Mother   . Colon polyps Father   . Lung cancer Father   . Breast cancer Paternal Grandmother   . Colon polyps Brother   . Thyroid disease Neg Hx   . Stomach cancer Neg Hx   . Pancreatic cancer Neg Hx     Social History   Socioeconomic History  . Marital status: Married    Spouse name: Not on file  . Number of children: 2  . Years of education: Not on file  . Highest education level: Not on file  Occupational History  . Occupation: unemployed    Fish farm manager: [employer]  Social Needs  . Financial resource strain: Not on file  . Food insecurity:    Worry: Not on file    Inability: Not on file  . Transportation needs:    Medical: Not on file    Non-medical: Not on file  Tobacco Use  . Smoking status: Former Smoker    Packs/day: 1.00    Years: 10.00    Pack years: 10.00    Types: Cigarettes    Last attempt to quit: 03/07/1984    Years since quitting: 33.5  . Smokeless tobacco: Never Used  Substance and Sexual Activity  . Alcohol use: No    Alcohol/week: 0.0 oz  . Drug use: No  . Sexual activity: Not  on file  Lifestyle  . Physical activity:    Days per week: Not on file    Minutes per session: Not on file  . Stress: Not on file  Relationships  . Social connections:    Talks on phone: Not on file    Gets together: Not on file    Attends religious service: Not on file    Active member of club or organization: Not on file    Attends meetings of clubs or organizations: Not on file    Relationship status: Not on file  . Intimate partner violence:    Fear of current or ex partner: Not on file    Emotionally abused: Not on file    Physically abused: Not on file    Forced sexual activity: Not on file  Other Topics Concern  . Not on file  Social History Narrative  . Not on file      Review of systems: Review of Systems  Constitutional: Negative for fever and chills.  HENT: Negative.   Eyes: Negative for blurred vision.  Respiratory: Negative for cough, shortness of breath and wheezing.   Cardiovascular: Negative for chest pain and palpitations.  Gastrointestinal: as per HPI Genitourinary: Negative for dysuria, urgency, frequency and hematuria.  Musculoskeletal: Negative for myalgias, back pain and joint pain.  Skin: Negative for itching and rash.  Neurological: Negative for dizziness, tremors, focal weakness, seizures and loss of consciousness.  Endo/Heme/Allergies: Positive for seasonal allergies.  Psychiatric/Behavioral: Negative for depression, suicidal ideas and hallucinations.  All other systems reviewed and are negative.   Physical Exam: Vitals:   09/19/17 0927  BP: 114/62  Pulse: 64   Body mass index is 29.76 kg/m. Gen:      No acute distress HEENT:  EOMI, sclera anicteric Neck:     No masses; no thyromegaly Lungs:    Clear to auscultation bilaterally; normal respiratory effort CV:         Regular rate and rhythm; no murmurs Abd:      + bowel sounds; soft, non-tender; no palpable masses, no distension Ext:    No edema; adequate peripheral perfusion Skin:  Warm and dry; no rash Neuro: alert and oriented x 3 Psych: normal mood and affect  Data Reviewed:  Reviewed labs, radiology imaging, old records and pertinent past GI work up   Assessment and Plan/Recommendations:  57 year old female with history of Crohn's disease of small and large intestine status post terminal ileal resection with ileocolonic anastomosis  Continue Entyvio infusion every 4 weeks Await drug trough results Follow-up CBC, CMP, B12, folate  Pancreatic insufficiency Continue Creon 72,000 units with meals and 36,000 units with snacks  Chronic diarrhea secondary to pancreatic insufficiency and also bile salt induced Continue Colestid 1 g 3 times daily with meals  Chronic nausea and cyclic vomiting Likely secondary to narcotic induced gastroparesis Plan to continue to taper down narcotics, currently followed by pain management clinic. She has appointment at [facility] for management of gastroparesis with Dr. Barrie Dunker refill for Zofran 4 mg ODT daily as needed and liquid Phenergan 25 mg every 12 hours as needed  GERD: Continue antireflux measures and PPI  Solid dysphagia: Scheduled for EGD for evaluation with possible esophageal dilation if needed The risks and benefits as well as alternatives of endoscopic procedure(s) have been discussed and reviewed. All questions answered. The patient agrees to proceed.   Greater than 50% of the time used for counseling as well as treatment plan and follow-up. She had multiple questions which were answered to her satisfaction  K. Denzil Magnuson , MD [phone]    CC: Rochel Brome, MD

## 2017-09-19 NOTE — Patient Instructions (Signed)
You have been scheduled for an endoscopy. Please follow written instructions given to you at your visit today. If you use inhalers (even only as needed), please bring them with you on the day of your procedure. Your physician has requested that you go to www.startemmi.com and enter the access code given to you at your visit today. This web site gives a general overview about your procedure. However, you should still follow specific instructions given to you by our office regarding your preparation for the procedure.  Go to the basement for labs today  We have sent in liquid phenergan to [facility]  We will send in Zofran to your [facility]  If you are age 67 or older, your body mass index should be between 23-30. Your Body mass index is 29.76 kg/m. If this is out of the aforementioned range listed, please consider follow up with your Primary Care Provider.  If you are age 6 or younger, your body mass index should be between 19-25. Your Body mass index is 29.76 kg/m. If this is out of the aformentioned range listed, please consider follow up with your Primary Care Provider.

## 2017-09-20 ENCOUNTER — Ambulatory Visit (AMBULATORY_SURGERY_CENTER): Payer: BLUE CROSS/BLUE SHIELD | Admitting: Gastroenterology

## 2017-09-20 ENCOUNTER — Other Ambulatory Visit: Payer: Self-pay

## 2017-09-20 ENCOUNTER — Encounter: Payer: Self-pay | Admitting: Gastroenterology

## 2017-09-20 VITALS — BP 164/97 | HR 63 | Temp 97.8°F | Resp 20 | Ht 67.0 in | Wt 190.0 lb

## 2017-09-20 DIAGNOSIS — K219 Gastro-esophageal reflux disease without esophagitis: Secondary | ICD-10-CM | POA: Diagnosis not present

## 2017-09-20 DIAGNOSIS — K2900 Acute gastritis without bleeding: Secondary | ICD-10-CM

## 2017-09-20 DIAGNOSIS — K5000 Crohn's disease of small intestine without complications: Secondary | ICD-10-CM

## 2017-09-20 DIAGNOSIS — R1310 Dysphagia, unspecified: Secondary | ICD-10-CM | POA: Diagnosis present

## 2017-09-20 MED ORDER — SODIUM CHLORIDE 0.9 % IV SOLN: 500.0000 mL | Freq: Once | INTRAVENOUS | Status: DC

## 2017-09-20 MED ORDER — OMEPRAZOLE 40 MG PO CPDR: 40.0000 mg | DELAYED_RELEASE_CAPSULE | Freq: Every day | ORAL | 3 refills | Status: AC

## 2017-09-20 NOTE — Patient Instructions (Signed)
YOU HAD AN ENDOSCOPIC PROCEDURE TODAY AT [facility]:   Refer to the procedure report that was given to you for any specific questions about what was found during the examination.  If the procedure report does not answer your questions, please call your gastroenterologist to clarify.  If you requested that your care partner not be given the details of your procedure findings, then the procedure report has been included in a sealed envelope for you to review at your convenience later.  YOU SHOULD EXPECT: Some feelings of bloating in the abdomen. Passage of more gas than usual.  Walking can help get rid of the air that was put into your GI tract during the procedure and reduce the bloating. If you had a lower endoscopy (such as a colonoscopy or flexible sigmoidoscopy) you may notice spotting of blood in your stool or on the toilet paper. If you underwent a bowel prep for your procedure, you may not have a normal bowel movement for a few days.  Please Note:  You might notice some irritation and congestion in your nose or some drainage.  This is from the oxygen used during your procedure.  There is no need for concern and it should clear up in a day or so.  SYMPTOMS TO REPORT IMMEDIATELY:   Following upper endoscopy (EGD)  Vomiting of blood or coffee ground material  New chest pain or pain under the shoulder blades  Painful or persistently difficult swallowing  New shortness of breath  Fever of 100F or higher  Black, tarry-looking stools  For urgent or emergent issues, a gastroenterologist can be reached at any hour by calling [phone].   DIET:  We do recommend a small meal at first, but then you may proceed to your regular diet.  Drink plenty of fluids but you should avoid alcoholic beverages for 24 hours.  ACTIVITY:  You should plan to take it easy for the rest of today and you should NOT DRIVE or use heavy machinery until tomorrow (because of the sedation medicines used  during the test).    FOLLOW UP: Our staff will call the number listed on your records the next business day following your procedure to check on you and address any questions or concerns that you may have regarding the information given to you following your procedure. If we do not reach you, we will leave a message.  However, if you are feeling well and you are not experiencing any problems, there is no need to return our call.  We will assume that you have returned to your regular daily activities without incident.  If any biopsies were taken you will be contacted by phone or by letter within the next 1-3 weeks.  Please call us at [phone] if you have not heard about the biopsies in 3 weeks.    SIGNATURES/CONFIDENTIALITY: You and/or your care partner have signed paperwork which will be entered into your electronic medical record.  These signatures attest to the fact that that the information above on your After Visit Summary has been reviewed and is understood.  Full responsibility of the confidentiality of this discharge information lies with you and/or your care-partner.  Gastritis information given. Anti reflux information given.

## 2017-09-20 NOTE — Op Note (Signed)
[place] Patient Name: [name] Procedure Date: 09/20/2017 2:26 PM MRN: [id number] Endoscopist: Mauri Pole , MD Age: 57 Referring MD:  Date of Birth: 1960/07/14 Gender: Female Account #: [id number] Procedure:                Upper GI endoscopy Indications:              Dysphagia Medicines:                Monitored Anesthesia Care Procedure:                Pre-Anesthesia Assessment:                           - Prior to the procedure, a History and Physical                            was performed, and patient medications and                            allergies were reviewed. The patient's tolerance of                            previous anesthesia was also reviewed. The risks                            and benefits of the procedure and the sedation                            options and risks were discussed with the patient.                            All questions were answered, and informed consent                            was obtained. Prior Anticoagulants: The patient has                            taken no previous anticoagulant or antiplatelet                            agents. ASA Grade Assessment: III - A patient with                            severe systemic disease. After reviewing the risks                            and benefits, the patient was deemed in                            satisfactory condition to undergo the procedure.                           After obtaining informed consent, the endoscope was  passed under direct vision. Throughout the                            procedure, the patient's blood pressure, pulse, and                            oxygen saturations were monitored continuously. The                            Endoscope was introduced through the mouth, and                            advanced to the second part of duodenum. The upper                            GI endoscopy was accomplished  without difficulty.                            The patient tolerated the procedure well. Scope In: Scope Out: Findings:                 LA Grade C (one or more mucosal breaks continuous                            between tops of 2 or more mucosal folds, less than                            75% circumference) esophagitis with no bleeding was                            found 33 to 35 cm from the incisors.                           Bilious fluid was found in the gastric body.                           Patchy moderate inflammation characterized by                            congestion (edema) and erythema was found in the                            entire examined stomach. Biopsies were taken with a                            cold forceps for Helicobacter pylori testing.                           The examined duodenum was normal. Complications:            No immediate complications. Estimated Blood Loss:     Estimated blood loss was minimal. Impression:               - LA Grade C reflux esophagitis.                           -  Bilious gastric fluid.                           - Gastritis. Biopsied.                           - Normal examined duodenum. Recommendation:           - Patient has a contact number available for                            emergencies. The signs and symptoms of potential                            delayed complications were discussed with the                            patient. Return to normal activities tomorrow.                            Written discharge instructions were provided to the                            patient.                           - Resume previous diet.                           - Continue present medications.                           - Await pathology results.                           - Use Prilosec (omeprazole) 40 mg PO daily.                           - Follow an antireflux regimen. This includes:                           - Do not lie  down for at least 3 to 4 hours after                            meals.                           - Raise the head of the bed 4 to 6 inches.                           - Decrease excess weight.                           - Avoid citrus juices and other acidic foods,                            alcohol, chocolate, mints, coffee  and other                            caffeinated beverages, carbonated beverages, fatty                            and fried foods.                           - Avoid tight-fitting clothing.                           - Avoid cigarettes and other tobacco products.                           - Eat a gastroparesis diet (small frequent meals,                            low fat, low fiber, chew thoroughly, drink liquids                            during meals). Mauri Pole, MD 09/20/2017 2:48:59 PM This report has been signed electronically.

## 2017-09-20 NOTE — Progress Notes (Signed)
Called to room to assist during endoscopic procedure.  Patient ID and intended procedure confirmed with present staff. Received instructions for my participation in the procedure from the performing physician.  

## 2017-09-20 NOTE — Progress Notes (Signed)
Report to PACU, RN, vss, BBS= Clear.  

## 2017-09-21 ENCOUNTER — Encounter: Payer: Self-pay | Admitting: Gastroenterology

## 2017-09-21 ENCOUNTER — Telehealth: Payer: Self-pay

## 2017-09-21 NOTE — Telephone Encounter (Signed)
Left message

## 2017-09-21 NOTE — Telephone Encounter (Signed)
  Follow up Call-  Call Izac Faulkenberry number 09/20/2017 05/03/2016  Post procedure Call Iyad Deroo phone  # [phone]  Permission to leave phone message Yes Yes  Some recent data might be hidden     Patient questions:  Do you have a fever, pain , or abdominal swelling? No. Pain Score  0 *  Have you tolerated food without any problems? Yes.    Have you been able to return to your normal activities? Yes.    Do you have any questions about your discharge instructions: Diet   No. Medications  No. Follow up visit  No.  Do you have questions or concerns about your Care? No.  Actions: * If pain score is 4 or above: No action needed, pain <4.

## 2017-09-26 ENCOUNTER — Other Ambulatory Visit: Payer: Self-pay

## 2017-09-26 ENCOUNTER — Telehealth: Payer: Self-pay | Admitting: Gastroenterology

## 2017-09-26 MED ORDER — CYANOCOBALAMIN 1000 MCG/ML IJ SOLN: INTRAMUSCULAR | 0 refills | Status: DC

## 2017-09-26 MED ORDER — NEEDLES & SYRINGES MISC: 1.0000 | 0 refills | Status: AC

## 2017-09-26 MED ORDER — CYANOCOBALAMIN 1000 MCG/ML IJ SOLN: INTRAMUSCULAR | 5 refills | Status: DC

## 2017-09-26 NOTE — Telephone Encounter (Signed)
Rx corrected.

## 2017-09-28 DIAGNOSIS — M47817 Spondylosis without myelopathy or radiculopathy, lumbosacral region: Secondary | ICD-10-CM | POA: Diagnosis not present

## 2017-09-28 DIAGNOSIS — G894 Chronic pain syndrome: Secondary | ICD-10-CM | POA: Diagnosis not present

## 2017-09-28 DIAGNOSIS — Z79899 Other long term (current) drug therapy: Secondary | ICD-10-CM | POA: Diagnosis not present

## 2017-09-28 DIAGNOSIS — Z79891 Long term (current) use of opiate analgesic: Secondary | ICD-10-CM | POA: Diagnosis not present

## 2017-10-09 ENCOUNTER — Encounter (HOSPITAL_COMMUNITY): Payer: BLUE CROSS/BLUE SHIELD

## 2017-10-12 DIAGNOSIS — Z79899 Other long term (current) drug therapy: Secondary | ICD-10-CM | POA: Diagnosis not present

## 2017-10-12 DIAGNOSIS — G894 Chronic pain syndrome: Secondary | ICD-10-CM | POA: Diagnosis not present

## 2017-10-12 DIAGNOSIS — M47817 Spondylosis without myelopathy or radiculopathy, lumbosacral region: Secondary | ICD-10-CM | POA: Diagnosis not present

## 2017-10-12 DIAGNOSIS — Z79891 Long term (current) use of opiate analgesic: Secondary | ICD-10-CM | POA: Diagnosis not present

## 2017-10-12 DIAGNOSIS — M5137 Other intervertebral disc degeneration, lumbosacral region: Secondary | ICD-10-CM | POA: Diagnosis not present

## 2017-10-18 DIAGNOSIS — Z9049 Acquired absence of other specified parts of digestive tract: Secondary | ICD-10-CM | POA: Diagnosis not present

## 2017-10-18 DIAGNOSIS — R112 Nausea with vomiting, unspecified: Secondary | ICD-10-CM | POA: Diagnosis not present

## 2017-10-18 DIAGNOSIS — Y92239 Unspecified place in hospital as the place of occurrence of the external cause: Secondary | ICD-10-CM | POA: Diagnosis not present

## 2017-10-18 DIAGNOSIS — R109 Unspecified abdominal pain: Secondary | ICD-10-CM | POA: Diagnosis not present

## 2017-10-18 DIAGNOSIS — R5381 Other malaise: Secondary | ICD-10-CM | POA: Diagnosis not present

## 2017-10-22 MED FILL — PROMETHAZINE 6.25 MG/5 ML S: 6.25 | 6 days supply | Qty: 120 | Fill #1

## 2017-10-23 ENCOUNTER — Encounter (HOSPITAL_COMMUNITY)
Admission: RE | Admit: 2017-10-23 | Discharge: 2017-10-23 | Disposition: A | Discharge status: Home / Self Care | Payer: BLUE CROSS/BLUE SHIELD | Source: Ambulatory Visit | Attending: Gastroenterology | Admitting: Gastroenterology

## 2017-10-23 ENCOUNTER — Encounter (HOSPITAL_COMMUNITY): Payer: Self-pay

## 2017-10-23 DIAGNOSIS — K50018 Crohn's disease of small intestine with other complication: Secondary | ICD-10-CM

## 2017-10-23 DIAGNOSIS — K50119 Crohn's disease of large intestine with unspecified complications: Secondary | ICD-10-CM | POA: Diagnosis not present

## 2017-10-23 MED ORDER — SODIUM CHLORIDE 0.9 % IV SOLN
INTRAVENOUS | Status: DC
  Administered 2017-10-23: 09:00:00 via INTRAVENOUS

## 2017-10-23 MED ORDER — VEDOLIZUMAB 300 MG IV SOLR
300.0000 mg | INTRAVENOUS | Status: DC
  Administered 2017-10-23: 300 mg via INTRAVENOUS
  Filled 2017-10-23: qty 5

## 2017-10-23 NOTE — Discharge Instructions (Signed)
Vedolizumab injection solution What is this medicine? VEDOLIZUMAB (Ve doe LIZ you mab) is used to treat ulcerative colitis and Crohn's disease in adult patients. This medicine may be used for other purposes; ask your health care provider or pharmacist if you have questions. COMMON BRAND NAME(S): Entyvio What should I tell my health care provider before I take this medicine? They need to know if you have any of these conditions: -diabetes -hepatitis B or history of hepatitis B infection -HIV or AIDS -immune system problems -infection or history of infections -liver disease -recently received or scheduled to receive a vaccine -scheduled to have surgery -tuberculosis, a positive skin test for tuberculosis or have recently been in close contact with someone who has tuberculosis - an unusual or allergic reaction to vedolizumab, other medicines, foods, dyes, or preservatives -pregnant or trying to get pregnant -breast-feeding How should I use this medicine? This medicine is for infusion into a vein. It is given by a health care professional in a hospital or clinic setting. A special MedGuide will be given to you by the pharmacist with each prescription and refill. Be sure to read this information carefully each time. Talk to your pediatrician regarding the use of this medicine in children. This medicine is not approved for use in children. Overdosage: If you think you have taken too much of this medicine contact a poison control center or emergency room at once. NOTE: This medicine is only for you. Do not share this medicine with others. What if I miss a dose? It is important not to miss your dose. Call your doctor or health care professional if you are unable to keep an appointment. What may interact with this medicine? -steroid medicines like prednisone or cortisone -TNF-alpha inhibitors like natalizumab, adalimumab, and infliximab -vaccines This list may not describe all possible  interactions. Give your health care provider a list of all the medicines, herbs, non-prescription drugs, or dietary supplements you use. Also tell them if you smoke, drink alcohol, or use illegal drugs. Some items may interact with your medicine. What should I watch for while using this medicine? Your condition will be monitored carefully while you are receiving this medicine. Visit your doctor for regular check ups. Tell your doctor or healthcare professional if your symptoms do not start to get better or if they get worse. Stay away from people who are sick. Call your doctor or health care professional for advice if you get a fever, chills or sore throat, or other symptoms of a cold or flu. Do not treat yourself. In some patients, this medicine may cause a serious brain infection that may cause death. If you have any problems seeing, thinking, speaking, walking, or standing, tell your doctor right away. If you cannot reach your doctor, get urgent medical care. What side effects may I notice from receiving this medicine? Side effects that you should report to your doctor or health care professional as soon as possible: -allergic reactions like skin rash, itching or hives, swelling of the face, lips, or tongue -breathing problems -changes in vision -chest pain -dark urine -depression, feelings of sadness -dizziness -general ill feeling or flu-like symptoms -irregular, missed, or painful menstrual periods -light-colored stools -loss of appetite, nausea -muscle weakness -problems with balance, talking, or walking -right upper belly pain -unusually weak or tired -yellowing of the eyes or skin Side effects that usually do not require medical attention (report to your doctor or health care professional if they continue or are bothersome): -aches, pains -headache -  stomach upset -tiredness This list may not describe all possible side effects. Call your doctor for medical advice about side  effects. You may report side effects to FDA at 1-800-FDA-1088. Where should I keep my medicine? This drug is given in a hospital or clinic and will not be stored at home. NOTE: This sheet is a summary. It may not cover all possible information. If you have questions about this medicine, talk to your doctor, pharmacist, or health care provider.  2018 Elsevier/Gold Standard (2015-03-26 08:36:51)

## 2017-10-24 ENCOUNTER — Other Ambulatory Visit (HOSPITAL_COMMUNITY): Payer: Self-pay | Admitting: General Practice

## 2017-10-24 ENCOUNTER — Other Ambulatory Visit: Payer: Self-pay

## 2017-10-24 DIAGNOSIS — K5000 Crohn's disease of small intestine without complications: Secondary | ICD-10-CM

## 2017-10-26 DIAGNOSIS — M47817 Spondylosis without myelopathy or radiculopathy, lumbosacral region: Secondary | ICD-10-CM | POA: Diagnosis not present

## 2017-10-26 DIAGNOSIS — M5137 Other intervertebral disc degeneration, lumbosacral region: Secondary | ICD-10-CM | POA: Diagnosis not present

## 2017-10-26 DIAGNOSIS — Z79891 Long term (current) use of opiate analgesic: Secondary | ICD-10-CM | POA: Diagnosis not present

## 2017-10-26 DIAGNOSIS — G894 Chronic pain syndrome: Secondary | ICD-10-CM | POA: Diagnosis not present

## 2017-10-26 DIAGNOSIS — Z79899 Other long term (current) drug therapy: Secondary | ICD-10-CM | POA: Diagnosis not present

## 2017-10-27 DIAGNOSIS — R112 Nausea with vomiting, unspecified: Secondary | ICD-10-CM | POA: Diagnosis not present

## 2017-11-08 ENCOUNTER — Encounter (HOSPITAL_COMMUNITY): Payer: BLUE CROSS/BLUE SHIELD

## 2017-11-08 DIAGNOSIS — K3189 Other diseases of stomach and duodenum: Secondary | ICD-10-CM | POA: Diagnosis not present

## 2017-11-08 DIAGNOSIS — R112 Nausea with vomiting, unspecified: Secondary | ICD-10-CM | POA: Diagnosis not present

## 2017-11-09 DIAGNOSIS — K909 Intestinal malabsorption, unspecified: Secondary | ICD-10-CM | POA: Diagnosis not present

## 2017-11-09 DIAGNOSIS — R197 Diarrhea, unspecified: Secondary | ICD-10-CM | POA: Diagnosis not present

## 2017-11-09 DIAGNOSIS — R112 Nausea with vomiting, unspecified: Secondary | ICD-10-CM | POA: Diagnosis not present

## 2017-11-09 DIAGNOSIS — R1030 Lower abdominal pain, unspecified: Secondary | ICD-10-CM | POA: Diagnosis not present

## 2017-11-09 DIAGNOSIS — R140 Abdominal distension (gaseous): Secondary | ICD-10-CM | POA: Diagnosis not present

## 2017-11-20 ENCOUNTER — Other Ambulatory Visit: Payer: Self-pay | Admitting: Gastroenterology

## 2017-11-22 MED FILL — PROMETHAZINE 6.25 MG/5 ML S: 6.25 | 6 days supply | Qty: 120 | Fill #2

## 2017-11-23 DIAGNOSIS — Z79899 Other long term (current) drug therapy: Secondary | ICD-10-CM | POA: Diagnosis not present

## 2017-11-23 DIAGNOSIS — M47817 Spondylosis without myelopathy or radiculopathy, lumbosacral region: Secondary | ICD-10-CM | POA: Diagnosis not present

## 2017-11-23 DIAGNOSIS — Z4542 Encounter for adjustment and management of neuropacemaker (brain) (peripheral nerve) (spinal cord): Secondary | ICD-10-CM | POA: Diagnosis not present

## 2017-11-23 DIAGNOSIS — G894 Chronic pain syndrome: Secondary | ICD-10-CM | POA: Diagnosis not present

## 2017-11-23 DIAGNOSIS — G629 Polyneuropathy, unspecified: Secondary | ICD-10-CM | POA: Diagnosis not present

## 2017-11-23 DIAGNOSIS — Z79891 Long term (current) use of opiate analgesic: Secondary | ICD-10-CM | POA: Diagnosis not present

## 2017-11-29 DIAGNOSIS — R109 Unspecified abdominal pain: Secondary | ICD-10-CM | POA: Diagnosis not present

## 2017-11-29 DIAGNOSIS — K210 Gastro-esophageal reflux disease with esophagitis: Secondary | ICD-10-CM | POA: Diagnosis not present

## 2017-11-29 DIAGNOSIS — Z8719 Personal history of other diseases of the digestive system: Secondary | ICD-10-CM | POA: Diagnosis not present

## 2017-12-01 DIAGNOSIS — E0590 Thyrotoxicosis, unspecified without thyrotoxic crisis or storm: Secondary | ICD-10-CM | POA: Diagnosis not present

## 2017-12-01 DIAGNOSIS — K50919 Crohn's disease, unspecified, with unspecified complications: Secondary | ICD-10-CM | POA: Diagnosis not present

## 2017-12-04 ENCOUNTER — Encounter (HOSPITAL_COMMUNITY)
Admission: RE | Admit: 2017-12-04 | Discharge: 2017-12-04 | Disposition: A | Discharge status: Home / Self Care | Payer: BLUE CROSS/BLUE SHIELD | Source: Ambulatory Visit | Attending: Gastroenterology | Admitting: Gastroenterology

## 2017-12-04 DIAGNOSIS — K5000 Crohn's disease of small intestine without complications: Secondary | ICD-10-CM

## 2017-12-04 DIAGNOSIS — K50119 Crohn's disease of large intestine with unspecified complications: Secondary | ICD-10-CM | POA: Insufficient documentation

## 2017-12-04 MED ORDER — VEDOLIZUMAB 300 MG IV SOLR
300.0000 mg | Freq: Once | INTRAVENOUS | Status: AC
  Administered 2017-12-04: 300 mg via INTRAVENOUS
  Filled 2017-12-04: qty 5

## 2017-12-04 MED ORDER — SODIUM CHLORIDE 0.9 % IV SOLN
INTRAVENOUS | Status: DC
  Administered 2017-12-04: 12:00:00 via INTRAVENOUS

## 2017-12-07 ENCOUNTER — Encounter (HOSPITAL_COMMUNITY): Payer: BLUE CROSS/BLUE SHIELD

## 2017-12-21 ENCOUNTER — Other Ambulatory Visit: Payer: Self-pay | Admitting: Gastroenterology

## 2017-12-21 DIAGNOSIS — M47817 Spondylosis without myelopathy or radiculopathy, lumbosacral region: Secondary | ICD-10-CM | POA: Diagnosis not present

## 2017-12-21 DIAGNOSIS — G894 Chronic pain syndrome: Secondary | ICD-10-CM | POA: Diagnosis not present

## 2017-12-21 DIAGNOSIS — M5137 Other intervertebral disc degeneration, lumbosacral region: Secondary | ICD-10-CM | POA: Diagnosis not present

## 2017-12-21 MED FILL — PROMETHAZINE 6.25 MG/5 ML S: 6.25 | 6 days supply | Qty: 120 | Fill #0

## 2018-01-04 ENCOUNTER — Encounter (HOSPITAL_COMMUNITY): Payer: BLUE CROSS/BLUE SHIELD

## 2018-01-04 DIAGNOSIS — M7060 Trochanteric bursitis, unspecified hip: Secondary | ICD-10-CM | POA: Diagnosis not present

## 2018-01-08 ENCOUNTER — Encounter (HOSPITAL_COMMUNITY): Payer: BLUE CROSS/BLUE SHIELD

## 2018-01-11 ENCOUNTER — Other Ambulatory Visit: Payer: Self-pay

## 2018-01-11 ENCOUNTER — Other Ambulatory Visit (HOSPITAL_COMMUNITY): Payer: Self-pay | Admitting: General Practice

## 2018-01-11 DIAGNOSIS — K5000 Crohn's disease of small intestine without complications: Secondary | ICD-10-CM

## 2018-01-15 ENCOUNTER — Encounter (HOSPITAL_COMMUNITY): Payer: Self-pay

## 2018-01-15 ENCOUNTER — Encounter (HOSPITAL_COMMUNITY)
Admission: RE | Admit: 2018-01-15 | Discharge: 2018-01-15 | Disposition: A | Discharge status: Home / Self Care | Payer: BLUE CROSS/BLUE SHIELD | Source: Ambulatory Visit | Attending: Gastroenterology | Admitting: Gastroenterology

## 2018-01-15 DIAGNOSIS — K50119 Crohn's disease of large intestine with unspecified complications: Secondary | ICD-10-CM | POA: Diagnosis not present

## 2018-01-15 DIAGNOSIS — K5000 Crohn's disease of small intestine without complications: Secondary | ICD-10-CM

## 2018-01-15 MED ORDER — VEDOLIZUMAB 300 MG IV SOLR
300.0000 mg | INTRAVENOUS | Status: DC
  Administered 2018-01-15: 300 mg via INTRAVENOUS
  Filled 2018-01-15: qty 5

## 2018-01-15 MED ORDER — SODIUM CHLORIDE 0.9 % IV SOLN
INTRAVENOUS | Status: DC
Start: 2018-01-15 — End: 2018-01-16
  Administered 2018-01-15: 10:00:00 via INTRAVENOUS

## 2018-01-17 DIAGNOSIS — K589 Irritable bowel syndrome without diarrhea: Secondary | ICD-10-CM | POA: Diagnosis not present

## 2018-01-17 DIAGNOSIS — K219 Gastro-esophageal reflux disease without esophagitis: Secondary | ICD-10-CM | POA: Diagnosis not present

## 2018-01-17 DIAGNOSIS — R110 Nausea: Secondary | ICD-10-CM | POA: Diagnosis not present

## 2018-01-17 DIAGNOSIS — K5080 Crohn's disease of both small and large intestine without complications: Secondary | ICD-10-CM | POA: Diagnosis not present

## 2018-01-17 DIAGNOSIS — Z09 Encounter for follow-up examination after completed treatment for conditions other than malignant neoplasm: Secondary | ICD-10-CM | POA: Diagnosis not present

## 2018-01-17 DIAGNOSIS — E0590 Thyrotoxicosis, unspecified without thyrotoxic crisis or storm: Secondary | ICD-10-CM | POA: Diagnosis not present

## 2018-01-24 MED FILL — PROMETHAZINE 6.25 MG/5 ML S: 6.25 | 6 days supply | Qty: 120 | Fill #1

## 2018-01-25 DIAGNOSIS — M5137 Other intervertebral disc degeneration, lumbosacral region: Secondary | ICD-10-CM | POA: Diagnosis not present

## 2018-01-25 DIAGNOSIS — Z79891 Long term (current) use of opiate analgesic: Secondary | ICD-10-CM | POA: Diagnosis not present

## 2018-01-25 DIAGNOSIS — M47817 Spondylosis without myelopathy or radiculopathy, lumbosacral region: Secondary | ICD-10-CM | POA: Diagnosis not present

## 2018-01-25 DIAGNOSIS — Z79899 Other long term (current) drug therapy: Secondary | ICD-10-CM | POA: Diagnosis not present

## 2018-01-25 DIAGNOSIS — G894 Chronic pain syndrome: Secondary | ICD-10-CM | POA: Diagnosis not present

## 2018-01-29 DIAGNOSIS — Z9049 Acquired absence of other specified parts of digestive tract: Secondary | ICD-10-CM | POA: Diagnosis not present

## 2018-01-29 DIAGNOSIS — K5080 Crohn's disease of both small and large intestine without complications: Secondary | ICD-10-CM | POA: Diagnosis not present

## 2018-02-05 ENCOUNTER — Other Ambulatory Visit: Payer: Self-pay | Admitting: Gastroenterology

## 2018-02-08 ENCOUNTER — Encounter (HOSPITAL_COMMUNITY): Payer: BLUE CROSS/BLUE SHIELD

## 2018-02-20 DIAGNOSIS — G629 Polyneuropathy, unspecified: Secondary | ICD-10-CM | POA: Diagnosis not present

## 2018-02-20 DIAGNOSIS — M5137 Other intervertebral disc degeneration, lumbosacral region: Secondary | ICD-10-CM | POA: Diagnosis not present

## 2018-02-20 DIAGNOSIS — G894 Chronic pain syndrome: Secondary | ICD-10-CM | POA: Diagnosis not present

## 2018-02-20 DIAGNOSIS — M47817 Spondylosis without myelopathy or radiculopathy, lumbosacral region: Secondary | ICD-10-CM | POA: Diagnosis not present

## 2018-02-21 ENCOUNTER — Other Ambulatory Visit (HOSPITAL_COMMUNITY): Payer: Self-pay | Admitting: General Practice

## 2018-02-22 ENCOUNTER — Other Ambulatory Visit (HOSPITAL_COMMUNITY): Payer: Self-pay | Admitting: General Practice

## 2018-02-22 ENCOUNTER — Other Ambulatory Visit: Payer: Self-pay

## 2018-02-22 DIAGNOSIS — K5000 Crohn's disease of small intestine without complications: Secondary | ICD-10-CM

## 2018-02-26 MED FILL — PROMETHAZINE 6.25 MG/5 ML S: 6.25 | 6 days supply | Qty: 120 | Fill #2

## 2018-03-04 ENCOUNTER — Other Ambulatory Visit: Payer: Self-pay | Admitting: Gastroenterology

## 2018-03-09 ENCOUNTER — Encounter (HOSPITAL_COMMUNITY): Payer: BLUE CROSS/BLUE SHIELD | Attending: Gastroenterology

## 2018-03-09 DIAGNOSIS — K50119 Crohn's disease of large intestine with unspecified complications: Secondary | ICD-10-CM | POA: Insufficient documentation

## 2018-03-12 DIAGNOSIS — K5090 Crohn's disease, unspecified, without complications: Secondary | ICD-10-CM | POA: Diagnosis not present

## 2018-03-12 DIAGNOSIS — D124 Benign neoplasm of descending colon: Secondary | ICD-10-CM | POA: Diagnosis not present

## 2018-03-12 DIAGNOSIS — K648 Other hemorrhoids: Secondary | ICD-10-CM | POA: Diagnosis not present

## 2018-03-12 DIAGNOSIS — K5289 Other specified noninfective gastroenteritis and colitis: Secondary | ICD-10-CM | POA: Diagnosis not present

## 2018-03-12 DIAGNOSIS — Z980 Intestinal bypass and anastomosis status: Secondary | ICD-10-CM | POA: Diagnosis not present

## 2018-03-12 DIAGNOSIS — D126 Benign neoplasm of colon, unspecified: Secondary | ICD-10-CM | POA: Diagnosis not present

## 2018-03-12 DIAGNOSIS — K529 Noninfective gastroenteritis and colitis, unspecified: Secondary | ICD-10-CM | POA: Diagnosis not present

## 2018-03-12 DIAGNOSIS — Z1211 Encounter for screening for malignant neoplasm of colon: Secondary | ICD-10-CM | POA: Diagnosis not present

## 2018-03-12 DIAGNOSIS — K5080 Crohn's disease of both small and large intestine without complications: Secondary | ICD-10-CM | POA: Diagnosis not present

## 2018-03-12 DIAGNOSIS — K621 Rectal polyp: Secondary | ICD-10-CM | POA: Diagnosis not present

## 2018-03-12 DIAGNOSIS — D122 Benign neoplasm of ascending colon: Secondary | ICD-10-CM | POA: Diagnosis not present

## 2018-03-12 DIAGNOSIS — K635 Polyp of colon: Secondary | ICD-10-CM | POA: Diagnosis not present

## 2018-03-16 MED FILL — PROMETHAZINE 6.25 MG/5 ML S: 6.25 | 6 days supply | Qty: 120 | Fill #2

## 2018-03-27 ENCOUNTER — Other Ambulatory Visit: Payer: Self-pay | Admitting: Gastroenterology

## 2018-03-27 DIAGNOSIS — G629 Polyneuropathy, unspecified: Secondary | ICD-10-CM | POA: Diagnosis not present

## 2018-03-27 DIAGNOSIS — Z79899 Other long term (current) drug therapy: Secondary | ICD-10-CM | POA: Diagnosis not present

## 2018-03-27 DIAGNOSIS — Z79891 Long term (current) use of opiate analgesic: Secondary | ICD-10-CM | POA: Diagnosis not present

## 2018-03-27 DIAGNOSIS — G894 Chronic pain syndrome: Secondary | ICD-10-CM | POA: Diagnosis not present

## 2018-03-27 DIAGNOSIS — M47817 Spondylosis without myelopathy or radiculopathy, lumbosacral region: Secondary | ICD-10-CM | POA: Diagnosis not present

## 2018-03-27 DIAGNOSIS — M7060 Trochanteric bursitis, unspecified hip: Secondary | ICD-10-CM | POA: Diagnosis not present

## 2018-04-16 ENCOUNTER — Encounter (HOSPITAL_COMMUNITY): Payer: BLUE CROSS/BLUE SHIELD

## 2018-04-25 DIAGNOSIS — E876 Hypokalemia: Secondary | ICD-10-CM | POA: Diagnosis not present

## 2018-04-25 DIAGNOSIS — I10 Essential (primary) hypertension: Secondary | ICD-10-CM | POA: Diagnosis not present

## 2018-04-25 DIAGNOSIS — Z87891 Personal history of nicotine dependence: Secondary | ICD-10-CM | POA: Diagnosis not present

## 2018-04-25 DIAGNOSIS — Z9889 Other specified postprocedural states: Secondary | ICD-10-CM | POA: Diagnosis not present

## 2018-04-25 DIAGNOSIS — E860 Dehydration: Secondary | ICD-10-CM | POA: Diagnosis not present

## 2018-04-25 DIAGNOSIS — K5090 Crohn's disease, unspecified, without complications: Secondary | ICD-10-CM | POA: Diagnosis not present

## 2018-04-25 DIAGNOSIS — R112 Nausea with vomiting, unspecified: Secondary | ICD-10-CM | POA: Diagnosis not present

## 2018-04-25 DIAGNOSIS — Z79899 Other long term (current) drug therapy: Secondary | ICD-10-CM | POA: Diagnosis not present

## 2018-04-25 DIAGNOSIS — Z9049 Acquired absence of other specified parts of digestive tract: Secondary | ICD-10-CM | POA: Diagnosis not present

## 2018-04-25 DIAGNOSIS — R1084 Generalized abdominal pain: Secondary | ICD-10-CM | POA: Diagnosis not present

## 2018-04-25 DIAGNOSIS — G8929 Other chronic pain: Secondary | ICD-10-CM | POA: Diagnosis not present

## 2018-05-02 ENCOUNTER — Encounter (HOSPITAL_COMMUNITY): Payer: Self-pay

## 2018-05-02 ENCOUNTER — Emergency Department (HOSPITAL_COMMUNITY): Payer: BLUE CROSS/BLUE SHIELD

## 2018-05-02 ENCOUNTER — Inpatient Hospital Stay (HOSPITAL_COMMUNITY)
Admission: EM | Admit: 2018-05-02 | Discharge: 2018-05-05 | DRG: 641 | Disposition: A | Discharge status: Home / Self Care | Payer: BLUE CROSS/BLUE SHIELD | Attending: Internal Medicine | Admitting: Internal Medicine

## 2018-05-02 ENCOUNTER — Other Ambulatory Visit: Payer: Self-pay

## 2018-05-02 DIAGNOSIS — I10 Essential (primary) hypertension: Secondary | ICD-10-CM | POA: Diagnosis present

## 2018-05-02 DIAGNOSIS — Z801 Family history of malignant neoplasm of trachea, bronchus and lung: Secondary | ICD-10-CM | POA: Diagnosis not present

## 2018-05-02 DIAGNOSIS — E271 Primary adrenocortical insufficiency: Secondary | ICD-10-CM | POA: Diagnosis not present

## 2018-05-02 DIAGNOSIS — Z8371 Family history of colonic polyps: Secondary | ICD-10-CM | POA: Diagnosis not present

## 2018-05-02 DIAGNOSIS — D638 Anemia in other chronic diseases classified elsewhere: Secondary | ICD-10-CM | POA: Diagnosis present

## 2018-05-02 DIAGNOSIS — Z800 Family history of malignant neoplasm of digestive organs: Secondary | ICD-10-CM | POA: Diagnosis not present

## 2018-05-02 DIAGNOSIS — K649 Unspecified hemorrhoids: Secondary | ICD-10-CM | POA: Diagnosis present

## 2018-05-02 DIAGNOSIS — R109 Unspecified abdominal pain: Secondary | ICD-10-CM | POA: Diagnosis not present

## 2018-05-02 DIAGNOSIS — K5010 Crohn's disease of large intestine without complications: Secondary | ICD-10-CM | POA: Diagnosis present

## 2018-05-02 DIAGNOSIS — M549 Dorsalgia, unspecified: Secondary | ICD-10-CM

## 2018-05-02 DIAGNOSIS — E785 Hyperlipidemia, unspecified: Secondary | ICD-10-CM | POA: Diagnosis present

## 2018-05-02 DIAGNOSIS — K219 Gastro-esophageal reflux disease without esophagitis: Secondary | ICD-10-CM | POA: Diagnosis present

## 2018-05-02 DIAGNOSIS — K449 Diaphragmatic hernia without obstruction or gangrene: Secondary | ICD-10-CM | POA: Diagnosis present

## 2018-05-02 DIAGNOSIS — Z885 Allergy status to narcotic agent status: Secondary | ICD-10-CM

## 2018-05-02 DIAGNOSIS — Z803 Family history of malignant neoplasm of breast: Secondary | ICD-10-CM | POA: Diagnosis not present

## 2018-05-02 DIAGNOSIS — G40909 Epilepsy, unspecified, not intractable, without status epilepticus: Secondary | ICD-10-CM | POA: Diagnosis present

## 2018-05-02 DIAGNOSIS — R112 Nausea with vomiting, unspecified: Secondary | ICD-10-CM | POA: Diagnosis not present

## 2018-05-02 DIAGNOSIS — N179 Acute kidney failure, unspecified: Secondary | ICD-10-CM | POA: Diagnosis not present

## 2018-05-02 DIAGNOSIS — Z79891 Long term (current) use of opiate analgesic: Secondary | ICD-10-CM | POA: Diagnosis not present

## 2018-05-02 DIAGNOSIS — K8689 Other specified diseases of pancreas: Secondary | ICD-10-CM | POA: Diagnosis not present

## 2018-05-02 DIAGNOSIS — M1990 Unspecified osteoarthritis, unspecified site: Secondary | ICD-10-CM | POA: Diagnosis present

## 2018-05-02 DIAGNOSIS — Z87891 Personal history of nicotine dependence: Secondary | ICD-10-CM

## 2018-05-02 DIAGNOSIS — Z888 Allergy status to other drugs, medicaments and biological substances status: Secondary | ICD-10-CM

## 2018-05-02 DIAGNOSIS — G894 Chronic pain syndrome: Secondary | ICD-10-CM | POA: Diagnosis present

## 2018-05-02 DIAGNOSIS — G8929 Other chronic pain: Secondary | ICD-10-CM | POA: Diagnosis present

## 2018-05-02 DIAGNOSIS — K3184 Gastroparesis: Secondary | ICD-10-CM | POA: Diagnosis present

## 2018-05-02 DIAGNOSIS — R569 Unspecified convulsions: Secondary | ICD-10-CM

## 2018-05-02 DIAGNOSIS — E876 Hypokalemia: Secondary | ICD-10-CM | POA: Diagnosis not present

## 2018-05-02 DIAGNOSIS — R1013 Epigastric pain: Secondary | ICD-10-CM | POA: Diagnosis not present

## 2018-05-02 DIAGNOSIS — R197 Diarrhea, unspecified: Secondary | ICD-10-CM | POA: Diagnosis not present

## 2018-05-02 LAB — COMPREHENSIVE METABOLIC PANEL
ALK PHOS: 103 U/L (ref 38–126)
ALT: 20 U/L (ref 0–44)
ALT: 20 U/L (ref 0–44)
ANION GAP: 14 (ref 5–15)
AST: 31 U/L (ref 15–41)
AST: 30 U/L (ref 15–41)
Albumin: 4.3 g/dL (ref 3.5–5.0)
Albumin: 3.8 g/dL (ref 3.5–5.0)
Alkaline Phosphatase: 85 U/L (ref 38–126)
Anion gap: 8 (ref 5–15)
BILIRUBIN TOTAL: 0.6 mg/dL (ref 0.3–1.2)
BUN: 7 mg/dL (ref 6–20)
BUN: 6 mg/dL (ref 6–20)
CALCIUM: 8.6 mg/dL — AB (ref 8.9–10.3)
CO2: 24 mmol/L (ref 22–32)
CO2: 24 mmol/L (ref 22–32)
CREATININE: 0.86 mg/dL (ref 0.44–1.00)
Calcium: 8 mg/dL — ABNORMAL LOW (ref 8.9–10.3)
Chloride: 100 mmol/L (ref 98–111)
Chloride: 108 mmol/L (ref 98–111)
Creatinine, Ser: 0.83 mg/dL (ref 0.44–1.00)
GFR calc Af Amer: >60 mL/min (ref >60)
GFR calc non Af Amer: >60 mL/min (ref >60)
Glucose, Bld: 115 mg/dL — ABNORMAL HIGH (ref 70–99)
Glucose, Bld: 96 mg/dL (ref 70–99)
Potassium: 2.5 mmol/L — CL (ref 3.5–5.1)
Potassium: 2.9 mmol/L — ABNORMAL LOW (ref 3.5–5.1)
SODIUM: 138 mmol/L (ref 135–145)
Sodium: 140 mmol/L (ref 135–145)
TOTAL PROTEIN: 8.7 g/dL — AB (ref 6.5–8.1)
Total Bilirubin: 0.7 mg/dL (ref 0.3–1.2)
Total Protein: 7.2 g/dL (ref 6.5–8.1)

## 2018-05-02 LAB — URINALYSIS, ROUTINE W REFLEX MICROSCOPIC
Bilirubin Urine: NEGATIVE
GLUCOSE, UA: NEGATIVE mg/dL
Ketones, ur: 5 mg/dL — AB
Leukocytes,Ua: NEGATIVE
Nitrite: NEGATIVE
Protein, ur: 30 mg/dL — AB
Specific Gravity, Urine: 1.012 (ref 1.005–1.030)
pH: 6 (ref 5.0–8.0)

## 2018-05-02 LAB — CBC
HCT: 45.7 % (ref 36.0–46.0)
Hemoglobin: 14.6 g/dL (ref 12.0–15.0)
MCH: 29.4 pg (ref 26.0–34.0)
MCHC: 31.9 g/dL (ref 30.0–36.0)
MCV: 92.1 fL (ref 80.0–100.0)
Platelets: 175 10*3/uL (ref 150–400)
RBC: 4.96 MIL/uL (ref 3.87–5.11)
RDW: 12.2 % (ref 11.5–15.5)
WBC: 5 10*3/uL (ref 4.0–10.5)
nRBC: 0 % (ref 0.0–0.2)

## 2018-05-02 LAB — MAGNESIUM: Magnesium: 1.5 mg/dL — ABNORMAL LOW (ref 1.7–2.4)

## 2018-05-02 LAB — T4, FREE: Free T4: 0.94 ng/dL (ref 0.82–1.77)

## 2018-05-02 LAB — LIPASE, BLOOD: Lipase: 37 U/L (ref 11–51)

## 2018-05-02 MED ORDER — OXYCODONE HCL 5 MG PO TABS
10.0000 mg | ORAL_TABLET | Freq: Every day | ORAL | Status: DC | PRN
  Administered 2018-05-02 – 2018-05-05 (×8): 10 mg via ORAL
  Filled 2018-05-02 (×8): qty 2

## 2018-05-02 MED ORDER — HYDROCORTISONE 2.5 % RE CREA
1.0000 "application " | TOPICAL_CREAM | Freq: Two times a day (BID) | RECTAL | Status: DC
  Administered 2018-05-02 – 2018-05-05 (×6): 1 via RECTAL
  Filled 2018-05-02: qty 28.35

## 2018-05-02 MED ORDER — PANCRELIPASE (LIP-PROT-AMYL) 36000-114000 UNITS PO CPEP
72000.0000 [IU] | ORAL_CAPSULE | Freq: Three times a day (TID) | ORAL | Status: DC
  Administered 2018-05-03 – 2018-05-05 (×7): 72000 [IU] via ORAL
  Filled 2018-05-02 (×9): qty 2

## 2018-05-02 MED ORDER — ONDANSETRON HCL 4 MG/2ML IJ SOLN
4.0000 mg | Freq: Four times a day (QID) | INTRAMUSCULAR | Status: DC | PRN
  Filled 2018-05-02 (×2): qty 2

## 2018-05-02 MED ORDER — TRAZODONE HCL 50 MG PO TABS
50.0000 mg | ORAL_TABLET | Freq: Every day | ORAL | Status: DC
  Administered 2018-05-02 – 2018-05-04 (×3): 50 mg via ORAL
  Filled 2018-05-02 (×3): qty 1

## 2018-05-02 MED ORDER — GABAPENTIN 400 MG PO CAPS
1200.0000 mg | ORAL_CAPSULE | Freq: Every day | ORAL | Status: DC
  Administered 2018-05-02 – 2018-05-04 (×3): 1200 mg via ORAL
  Filled 2018-05-02 (×3): qty 3

## 2018-05-02 MED ORDER — LEVETIRACETAM 250 MG PO TABS
250.0000 mg | ORAL_TABLET | Freq: Two times a day (BID) | ORAL | Status: DC
  Administered 2018-05-02 – 2018-05-05 (×6): 250 mg via ORAL
  Filled 2018-05-02 (×6): qty 1

## 2018-05-02 MED ORDER — ONDANSETRON HCL 4 MG/2ML IJ SOLN
4.0000 mg | Freq: Once | INTRAMUSCULAR | Status: AC
  Administered 2018-05-02: 4 mg via INTRAVENOUS
  Filled 2018-05-02: qty 2

## 2018-05-02 MED ORDER — HEPARIN SODIUM (PORCINE) 5000 UNIT/ML IJ SOLN
5000.0000 [IU] | Freq: Three times a day (TID) | INTRAMUSCULAR | Status: DC
  Administered 2018-05-02 – 2018-05-05 (×8): 5000 [IU] via SUBCUTANEOUS
  Filled 2018-05-02 (×8): qty 1

## 2018-05-02 MED ORDER — HYDROCORTISONE NA SUCCINATE PF 100 MG IJ SOLR
50.0000 mg | Freq: Four times a day (QID) | INTRAMUSCULAR | Status: AC
  Administered 2018-05-03: 50 mg via INTRAVENOUS
  Filled 2018-05-02: qty 2

## 2018-05-02 MED ORDER — ONDANSETRON HCL 4 MG PO TABS
4.0000 mg | ORAL_TABLET | Freq: Four times a day (QID) | ORAL | Status: DC | PRN
  Administered 2018-05-02 – 2018-05-03 (×2): 4 mg via ORAL
  Filled 2018-05-02 (×2): qty 1

## 2018-05-02 MED ORDER — GABAPENTIN 400 MG PO CAPS: 400.0000 mg | ORAL_CAPSULE | Freq: Three times a day (TID) | ORAL | Status: DC

## 2018-05-02 MED ORDER — ACETAMINOPHEN 325 MG PO TABS
650.0000 mg | ORAL_TABLET | Freq: Four times a day (QID) | ORAL | Status: DC | PRN
  Filled 2018-05-02: qty 2

## 2018-05-02 MED ORDER — HYDROCORTISONE 5 MG PO TABS
5.0000 mg | ORAL_TABLET | Freq: Two times a day (BID) | ORAL | Status: DC
  Administered 2018-05-03 – 2018-05-05 (×5): 5 mg via ORAL
  Filled 2018-05-02 (×5): qty 1

## 2018-05-02 MED ORDER — KETOROLAC TROMETHAMINE 30 MG/ML IJ SOLN
30.0000 mg | Freq: Once | INTRAMUSCULAR | Status: AC
  Administered 2018-05-02: 30 mg via INTRAVENOUS
  Filled 2018-05-02: qty 1

## 2018-05-02 MED ORDER — COLESTIPOL HCL 1 G PO TABS
1.0000 g | ORAL_TABLET | Freq: Three times a day (TID) | ORAL | Status: DC
  Administered 2018-05-02 – 2018-05-05 (×8): 1 g via ORAL
  Filled 2018-05-02 (×9): qty 1

## 2018-05-02 MED ORDER — PANTOPRAZOLE SODIUM 40 MG PO TBEC
40.0000 mg | DELAYED_RELEASE_TABLET | Freq: Every day | ORAL | Status: DC
  Administered 2018-05-02 – 2018-05-05 (×4): 40 mg via ORAL
  Filled 2018-05-02 (×4): qty 1

## 2018-05-02 MED ORDER — PROMETHAZINE HCL 25 MG/ML IJ SOLN
12.5000 mg | Freq: Four times a day (QID) | INTRAMUSCULAR | Status: DC | PRN
  Administered 2018-05-04: 12.5 mg via INTRAVENOUS
  Filled 2018-05-02 (×2): qty 1

## 2018-05-02 MED ORDER — POTASSIUM CHLORIDE CRYS ER 20 MEQ PO TBCR
40.0000 meq | EXTENDED_RELEASE_TABLET | Freq: Once | ORAL | Status: AC
  Administered 2018-05-02: 40 meq via ORAL
  Filled 2018-05-02: qty 2

## 2018-05-02 MED ORDER — FENTANYL CITRATE (PF) 100 MCG/2ML IJ SOLN
75.0000 ug | Freq: Once | INTRAMUSCULAR | Status: AC
  Administered 2018-05-02: 75 ug via INTRAVENOUS
  Filled 2018-05-02: qty 2

## 2018-05-02 MED ORDER — SODIUM CHLORIDE (PF) 0.9 % IJ SOLN
INTRAMUSCULAR | Status: AC
  Filled 2018-05-02: qty 50

## 2018-05-02 MED ORDER — TIZANIDINE HCL 4 MG PO TABS
4.0000 mg | ORAL_TABLET | Freq: Three times a day (TID) | ORAL | Status: DC | PRN
  Filled 2018-05-02: qty 1

## 2018-05-02 MED ORDER — PROCHLORPERAZINE MALEATE 10 MG PO TABS
10.0000 mg | ORAL_TABLET | Freq: Two times a day (BID) | ORAL | Status: DC
  Administered 2018-05-02 – 2018-05-05 (×6): 10 mg via ORAL
  Filled 2018-05-02 (×6): qty 1

## 2018-05-02 MED ORDER — PANCRELIPASE (LIP-PROT-AMYL) 36000-114000 UNITS PO CPEP: 36000.0000 [IU] | ORAL_CAPSULE | Freq: Three times a day (TID) | ORAL | Status: DC

## 2018-05-02 MED ORDER — SODIUM CHLORIDE 0.9% FLUSH: 3.0000 mL | Freq: Once | INTRAVENOUS | Status: DC

## 2018-05-02 MED ORDER — GABAPENTIN 400 MG PO CAPS
800.0000 mg | ORAL_CAPSULE | Freq: Every day | ORAL | Status: DC
  Administered 2018-05-03 – 2018-05-04 (×2): 800 mg via ORAL
  Filled 2018-05-02 (×2): qty 2

## 2018-05-02 MED ORDER — IOPAMIDOL (ISOVUE-300) INJECTION 61%
INTRAVENOUS | Status: AC
  Filled 2018-05-02: qty 100

## 2018-05-02 MED ORDER — PROPRANOLOL HCL ER 80 MG PO CP24
80.0000 mg | ORAL_CAPSULE | Freq: Every day | ORAL | Status: DC
  Administered 2018-05-02 – 2018-05-05 (×4): 80 mg via ORAL
  Filled 2018-05-02 (×4): qty 1

## 2018-05-02 MED ORDER — PANCRELIPASE (LIP-PROT-AMYL) 36000-114000 UNITS PO CPEP
36000.0000 [IU] | ORAL_CAPSULE | ORAL | Status: DC | PRN
  Filled 2018-05-02: qty 1

## 2018-05-02 MED ORDER — SODIUM CHLORIDE 0.9 % IV BOLUS
1000.0000 mL | Freq: Once | INTRAVENOUS | Status: AC
  Administered 2018-05-02: 1000 mL via INTRAVENOUS

## 2018-05-02 MED ORDER — PROCHLORPERAZINE EDISYLATE 10 MG/2ML IJ SOLN
10.0000 mg | Freq: Once | INTRAMUSCULAR | Status: AC
  Administered 2018-05-02: 10 mg via INTRAVENOUS
  Filled 2018-05-02: qty 2

## 2018-05-02 MED ORDER — ACETAMINOPHEN 650 MG RE SUPP: 650.0000 mg | Freq: Four times a day (QID) | RECTAL | Status: DC | PRN

## 2018-05-02 MED ORDER — POTASSIUM CHLORIDE 10 MEQ/100ML IV SOLN
10.0000 meq | INTRAVENOUS | Status: DC
  Administered 2018-05-02: 10 meq via INTRAVENOUS
  Filled 2018-05-02: qty 100

## 2018-05-02 MED ORDER — CLOBETASOL PROPIONATE 0.05 % EX CREA
1.0000 "application " | TOPICAL_CREAM | Freq: Two times a day (BID) | CUTANEOUS | Status: DC
  Administered 2018-05-02 – 2018-05-05 (×6): 1 via TOPICAL
  Filled 2018-05-02: qty 15

## 2018-05-02 MED ORDER — SODIUM CHLORIDE 0.9 % IV SOLN
INTRAVENOUS | Status: DC
  Administered 2018-05-02: 125 mL/h via INTRAVENOUS

## 2018-05-02 MED ORDER — ALBUTEROL SULFATE (2.5 MG/3ML) 0.083% IN NEBU: 2.5000 mg | INHALATION_SOLUTION | RESPIRATORY_TRACT | Status: DC | PRN

## 2018-05-02 MED ORDER — GABAPENTIN 400 MG PO CAPS
400.0000 mg | ORAL_CAPSULE | Freq: Every day | ORAL | Status: DC
  Administered 2018-05-03 – 2018-05-04 (×2): 400 mg via ORAL
  Filled 2018-05-02 (×2): qty 1

## 2018-05-02 MED ORDER — DRONABINOL 2.5 MG PO CAPS
2.5000 mg | ORAL_CAPSULE | Freq: Two times a day (BID) | ORAL | Status: DC
  Administered 2018-05-03 – 2018-05-04 (×4): 2.5 mg via ORAL
  Filled 2018-05-02 (×4): qty 1

## 2018-05-02 MED ORDER — POTASSIUM CHLORIDE 10 MEQ/100ML IV SOLN
10.0000 meq | Freq: Once | INTRAVENOUS | Status: AC
  Administered 2018-05-02: 10 meq via INTRAVENOUS
  Filled 2018-05-02: qty 100

## 2018-05-02 MED ORDER — HYOSCYAMINE SULFATE 0.125 MG SL SUBL
0.1250 mg | SUBLINGUAL_TABLET | Freq: Four times a day (QID) | SUBLINGUAL | Status: DC
  Administered 2018-05-02 – 2018-05-05 (×10): 0.125 mg via SUBLINGUAL
  Filled 2018-05-02 (×12): qty 1

## 2018-05-02 MED ORDER — POTASSIUM CHLORIDE IN NACL 20-0.45 MEQ/L-% IV SOLN
INTRAVENOUS | Status: DC
  Administered 2018-05-02 – 2018-05-04 (×3): via INTRAVENOUS
  Filled 2018-05-02 (×3): qty 1000

## 2018-05-02 MED ORDER — IOPAMIDOL (ISOVUE-300) INJECTION 61%
100.0000 mL | Freq: Once | INTRAVENOUS | Status: AC | PRN
  Administered 2018-05-02: 100 mL via INTRAVENOUS

## 2018-05-02 NOTE — ED Notes (Signed)
Date and time results received: 05/02/18 1125 (use smartphrase ".now" to insert current time)  Test: K+ Critical Value: 2.5  Name of Provider Notified: Dr. Hillard Danker  Orders Received? Or Actions Taken?:

## 2018-05-02 NOTE — ED Notes (Signed)
Bed: WA08 Expected date:  Expected time:  Means of arrival:  Comments: 

## 2018-05-02 NOTE — ED Notes (Signed)
ED TO INPATIENT HANDOFF REPORT  Name/Age/Gender [name] 58 y.o. female  Code Status Code Status History    Date Active Date Inactive Code Status Order ID Comments User Context   05/16/2017 0026 05/20/2017 1842 Full Code [id number]  Rise Patience, MD Inpatient   03/01/2017 1632 03/04/2017 1645 Full Code [id number]  Phillips Grout, MD Inpatient   07/19/2016 0937 07/19/2016 2010 Full Code [id number]  Willia Craze, NP Inpatient   05/24/2016 2306 06/02/2016 1957 Full Code [id number]  Karmen Bongo, MD Inpatient   02/20/2016 2207 02/24/2016 1353 Full Code [id number]  Sid Falcon, MD Inpatient   05/28/2013 1119 06/01/2013 1629 Full Code [id number]  Alfredia Ferguson, PA-C Inpatient      Home/SNF/Other Home  Chief Complaint crohn's flare up  Level of Care/Admitting Diagnosis ED Disposition    ED Disposition Condition [facility] Area: [facility] [[id number]]  Level of Care: Telemetry [5]  Admit to tele based on following criteria: Monitor QTC interval  Diagnosis: Hypokalemia [172180]  Admitting Physician: Lavina Hamman [[id number]]  Attending Physician: Lavina Hamman [[id number]]  PT Class (Do Not Modify): Observation [104]  PT Acc Code (Do Not Modify): Observation [10022]       Medical History Past Medical History:  Diagnosis Date  . Arthritis   . Chronic diarrhea   . Chronic disease anemia   . Crohn's disease ([place])   . Depression   . Family history of malignant neoplasm of gastrointestinal tract   . GERD (gastroesophageal reflux disease)   . H/O hiatal hernia   . History of adenomatous polyp of colon   . History of gastritis    AND HX ILEITIS  . History of kidney stones   . History of small bowel obstruction    SECONDARY CROHN'S STRICTURE  S/P COLECTOMY  . Hyperlipidemia   . Hypertension   . Hypopotassemia   . Internal hemorrhoids   . Kidney stones   . Pancreatic insufficiency   . PONV (postoperative nausea and  vomiting)   . Psoriasis    SKIN  . Right ureteral stone   . Rotavirus infection 05/31/2013  . S/P dilatation of esophageal stricture   . Seasonal asthma   . Urgency of urination   . Vitamin B12 deficiency     Allergies Allergies  Allergen Reactions  . Codeine Hives and Nausea And Vomiting  . Amitriptyline Itching and Hives  . Humira [Adalimumab] Rash    IV Location/Drains/Wounds Patient Lines/Drains/Airways Status   Active Line/Drains/Airways    Name:   Placement date:   Placement time:   Site:   Days:   Peripheral IV 05/02/18 Left Hand   05/02/18    1056    Hand   less than 1          Labs/Imaging Results for orders placed or performed during the hospital encounter of 05/02/18 (from the past 48 hour(s))  Lipase, blood     Status: None   Collection Time: 05/02/18 10:37 AM  Result Value Ref Range   Lipase 37 11 - 51 U/L    Comment: Performed at [facility], [place] [address] [place], [place] [address]  Comprehensive metabolic panel     Status: Abnormal   Collection Time: 05/02/18 10:37 AM  Result Value Ref Range   Sodium 138 135 - 145 mmol/L   Potassium 2.5 (LL) 3.5 - 5.1 mmol/L    Comment: CRITICAL RESULT CALLED TO, READ BACK BY  AND VERIFIED WITH: SMITH,T. RN AT 5038 05/02/18 MULLINS,T    Chloride 100 98 - 111 mmol/L   CO2 24 22 - 32 mmol/L   Glucose, Bld 115 (H) 70 - 99 mg/dL   BUN 7 6 - 20 mg/dL   Creatinine, Ser 0.86 0.44 - 1.00 mg/dL   Calcium 8.6 (L) 8.9 - 10.3 mg/dL   Total Protein 8.7 (H) 6.5 - 8.1 g/dL   Albumin 4.3 3.5 - 5.0 g/dL   AST 31 15 - 41 U/L   ALT 20 0 - 44 U/L   Alkaline Phosphatase 103 38 - 126 U/L   Total Bilirubin 0.6 0.3 - 1.2 mg/dL   GFR calc non Af Amer >60 >60 mL/min   GFR calc Af Amer >60 >60 mL/min   Anion gap 14 5 - 15    Comment: Performed at [facility], [place] [address], [place], [place] [address]  CBC     Status: None   Collection Time: 05/02/18 10:37 AM  Result Value Ref Range   WBC 5.0  4.0 - 10.5 K/uL   RBC 4.96 3.87 - 5.11 MIL/uL   Hemoglobin 14.6 12.0 - 15.0 g/dL   HCT 45.7 36.0 - 46.0 %   MCV 92.1 80.0 - 100.0 fL   MCH 29.4 26.0 - 34.0 pg   MCHC 31.9 30.0 - 36.0 g/dL   RDW 12.2 11.5 - 15.5 %   Platelets 175 150 - 400 K/uL   nRBC 0.0 0.0 - 0.2 %    Comment: Performed at [facility], [place] [address] [place], [place] [address]  Urinalysis, Routine w reflex microscopic     Status: Abnormal   Collection Time: 05/02/18 10:49 AM  Result Value Ref Range   Color, Urine YELLOW YELLOW   APPearance CLEAR CLEAR   Specific Gravity, Urine 1.012 1.005 - 1.030   pH 6.0 5.0 - 8.0   Glucose, UA NEGATIVE NEGATIVE mg/dL   Hgb urine dipstick SMALL (A) NEGATIVE   Bilirubin Urine NEGATIVE NEGATIVE   Ketones, ur 5 (A) NEGATIVE mg/dL   Protein, ur 30 (A) NEGATIVE mg/dL   Nitrite NEGATIVE NEGATIVE   Leukocytes,Ua NEGATIVE NEGATIVE   RBC / HPF 0-5 0 - 5 RBC/hpf   WBC, UA 0-5 0 - 5 WBC/hpf   Bacteria, UA FEW (A) NONE SEEN   Squamous Epithelial / LPF 6-10 0 - 5   Mucus PRESENT    Hyaline Casts, UA PRESENT     Comment: Performed at [facility], [place] [address], [place],  [address]   Ct Abdomen Pelvis W Contrast  Result Date: 05/02/2018 CLINICAL DATA:  Abdominal pain, diarrhea, back pain EXAM: CT ABDOMEN AND PELVIS WITH CONTRAST TECHNIQUE: Multidetector CT imaging of the abdomen and pelvis was performed using the standard protocol following bolus administration of intravenous contrast. CONTRAST:  196m ISOVUE-300 IOPAMIDOL (ISOVUE-300) INJECTION 61% COMPARISON:  05/18/2017 FINDINGS: Lower chest: No acute abnormality. Hepatobiliary: No focal liver abnormality is seen. Hepatic steatosis. Status post cholecystectomy. No biliary dilatation. Pancreas: Unremarkable. No pancreatic ductal dilatation or surrounding inflammatory changes. Spleen: Normal in size without focal abnormality. Adrenals/Urinary Tract: Adrenal glands are unremarkable. Kidneys are  normal, without renal calculi, focal lesion, or hydronephrosis. Bladder is unremarkable. Stomach/Bowel: Stomach is within normal limits. Evidence of prior partial right colon resection with fatty mural stratification of the underdistended transverse colon and to a lesser extent, the rectum. Vascular/Lymphatic: Calcific atherosclerosis. No enlarged abdominal or pelvic lymph nodes. Reproductive: No mass or other  abnormality. Status post hysterectomy. Other: No abdominal wall hernia or abnormality. No abdominopelvic ascites. Musculoskeletal: No acute or significant osseous findings. IMPRESSION: 1. No acute CT findings of the abdomen and pelvis to explain pain or diarrhea. 2. Evidence of prior partial right colon resection with fatty mural stratification of the underdistended transverse colon and to a lesser extent, the rectum. Findings are generally consistent post resection findings of Crohn's disease without evidence of acute inflammation. 3.  Other chronic and incidental findings as detailed above. Electronically Signed   By: Eddie Candle M.D.   On: 05/02/2018 15:10   Dg Abd Acute W/chest  Result Date: 05/02/2018 CLINICAL DATA:  Nausea and vomiting for 3 weeks. EXAM: DG ABDOMEN ACUTE W/ 1V CHEST COMPARISON:  CT chest, abdomen and pelvis 05/18/2017. Chest and two views abdomen 05/15/2017. FINDINGS: There is no evidence of dilated bowel loops or free intraperitoneal air. No radiopaque calculi or other significant radiographic abnormality is seen. Heart size and mediastinal contours are within normal limits. Both lungs are clear. Spinal stimulator and cholecystectomy clips noted. Suture for surgical anastomosis in the right abdomen also seen. IMPRESSION: Negative exam. Electronically Signed   By: Inge Rise M.D.   On: 05/02/2018 12:40   None  Pending Labs Unresulted Labs (From admission, onward)   None      Vitals/Pain Today's Vitals   05/02/18 0925 05/02/18 1229 05/02/18 1326 05/02/18 1542  BP:     (!) 184/98  Pulse:    81  Resp:    18  Temp:      TempSrc:   Oral   SpO2:    95%  Weight: 81.6 kg     Height: 5' 7"  (1.702 m)     PainSc:  10-Worst pain ever      Isolation Precautions No active isolations  Medications Medications  sodium chloride flush (NS) 0.9 % injection 3 mL ( Intravenous Canceled Entry 05/02/18 1050)  sodium chloride 0.9 % bolus 1,000 mL (0 mLs Intravenous Stopped 05/02/18 1228)    And  0.9 %  sodium chloride infusion (125 mL/hr Intravenous New Bag/Given 05/02/18 1228)  iopamidol (ISOVUE-300) 61 % injection (has no administration in time range)  sodium chloride (PF) 0.9 % injection (has no administration in time range)  potassium chloride 10 mEq in 100 mL IVPB (10 mEq Intravenous New Bag/Given 05/02/18 1541)  ondansetron (ZOFRAN) injection 4 mg (4 mg Intravenous Given 05/02/18 1129)  fentaNYL (SUBLIMAZE) injection 75 mcg (75 mcg Intravenous Given 05/02/18 1129)  potassium chloride SA (K-DUR,KLOR-CON) CR tablet 40 mEq (40 mEq Oral Given 05/02/18 1152)  potassium chloride 10 mEq in 100 mL IVPB (0 mEq Intravenous Stopped 05/02/18 1308)  prochlorperazine (COMPAZINE) injection 10 mg (10 mg Intravenous Given 05/02/18 1243)  ketorolac (TORADOL) 30 MG/ML injection 30 mg (30 mg Intravenous Given 05/02/18 1243)  iopamidol (ISOVUE-300) 61 % injection 100 mL (100 mLs Intravenous Contrast Given 05/02/18 1433)    Mobility walks

## 2018-05-02 NOTE — H&P (Signed)
[facility] History and Physical   Patient: [name] EOF:[id number]   PCP: Rochel Brome, MD DOB: [birth year]   DOA: 05/02/2018   DOS: 05/02/2018   DOS: the patient was seen and examined on 05/02/2018  Patient coming from: The patient is coming from home.  Chief Complaint: Nausea vomiting and diarrhea as well as worsening Back pain  HPI: [name] is a 58 y.o. female with Past medical history of Crohn's disease, cyclical vomiting syndrome, gastroparesis, GERD, HLD, B12 deficiency. Patient comes to the hospital with complaints of nausea vomiting and diarrhea.  She was seen in the [facility] ER on 04/27/2018 where she was able to tolerate p.o. diet and was discharged home.  Comes to the hospital again as she continues to have vomiting and unable to take anything down including her medicines specifically steroids.  Patient reports that she has some mild epigastric pain.  She mentions that she lost count of having bowel movements and how many vomiting she had in last 24 hours.  Denies any chills but has some low-grade fever.  No rash anywhere.  Mentions she is compliant with all her medications.  Denies any alcohol abuse. She does not use Reglan since her GI attending at [facility] asked her to stop it. She also reports that she has sharp back pain ongoing along with her nausea vomiting. She reports that this is a new pain and it is different than her chronic pain syndrome for which she is taking oxycodone. Described the pain as a sharp stabbing pain continuously present throughout the day and night.  ED Course: CT abdomen was performed, no acute abnormalities were seen.  Patient was seen to have hypokalemia and metabolic acidosis and therefore was referred for admission.  At her baseline ambulates without any support And is independent for most of her ADL; manages her medication on her own.  Review of Systems: as mentioned in the history of present illness.  All  other systems reviewed and are negative.  Past Medical History:  Diagnosis Date  . Arthritis   . Chronic diarrhea   . Chronic disease anemia   . Crohn's disease ([place])   . Depression   . Family history of malignant neoplasm of gastrointestinal tract   . GERD (gastroesophageal reflux disease)   . H/O hiatal hernia   . History of adenomatous polyp of colon   . History of gastritis    AND HX ILEITIS  . History of kidney stones   . History of small bowel obstruction    SECONDARY CROHN'S STRICTURE  S/P COLECTOMY  . Hyperlipidemia   . Hypertension   . Hypopotassemia   . Internal hemorrhoids   . Kidney stones   . Pancreatic insufficiency   . PONV (postoperative nausea and vomiting)   . Psoriasis    SKIN  . Right ureteral stone   . Rotavirus infection 05/31/2013  . S/P dilatation of esophageal stricture   . Seasonal asthma   . Urgency of urination   . Vitamin B12 deficiency    Past Surgical History:  Procedure Laterality Date  . ANAL FISSURE REPAIR  1990's  . CARPAL TUNNEL RELEASE Right 2000  . CHOLECYSTECTOMY  2002  . CYSTOSCOPY WITH RETROGRADE PYELOGRAM, URETEROSCOPY AND STENT PLACEMENT Left 03/08/2013   Procedure: CYSTOSCOPY WITH RETROGRADE PYELOGRAM, URETEROSCOPY AND STENT PLACEMENT stone basketing;  Surgeon: Alexis Frock, MD;  Location: [facility];  Service: Urology;  Laterality: Left;  . CYSTOSCOPY WITH RETROGRADE PYELOGRAM, URETEROSCOPY AND STENT PLACEMENT  Right 04/03/2013   Procedure: CYSTOSCOPY WITH RETROGRADE PYELOGRAM, URETEROSCOPY AND STENT PLACEMENT;  Surgeon: Alexis Frock, MD;  Location: [facility];  Service: Urology;  Laterality: Right;  . DX LAPAROSCOPY CONVERTED TO OPEN RIGHT COLECCTOMY  09-15-2010   ANASTOMOTIC STRICTURE  . LAPAROSCOPIC ILEOCECECTOMY  10-09-2008   AND APPENDECTOMY (TERMINAL ILEITIS & PARTIAL SBO)  . SPINAL CORD STIMULATOR IMPLANT Left 05/2017  . VAGINAL HYSTERECTOMY  1992   Social History:  reports that she quit smoking about 34  years ago. Her smoking use included cigarettes. She has a 10.00 pack-year smoking history. She has never used smokeless tobacco. She reports that she does not drink alcohol or use drugs.  Allergies  Allergen Reactions  . Codeine Hives and Nausea And Vomiting  . Amitriptyline Itching and Hives  . Humira [Adalimumab] Rash     Family History  Problem Relation Age of Onset  . Colon cancer Mother   . Colon polyps Mother   . Colon polyps Father   . Lung cancer Father   . Breast cancer Paternal Grandmother   . Colon polyps Brother   . Thyroid disease Neg Hx   . Stomach cancer Neg Hx   . Pancreatic cancer Neg Hx      Prior to Admission medications   Medication Sig Start Date End Date Taking? Authorizing Provider  albuterol (PROAIR HFA) 108 (90 BASE) MCG/ACT inhaler Inhale 2 puffs into the lungs every 4 (four) hours as needed for wheezing or shortness of breath.    Yes [provider]  clobetasol cream (TEMOVATE) 1.19 % Apply 1 application topically 2 (two) times daily.  03/12/17  Yes [provider]  colestipol (COLESTID) 1 g tablet Take 1 tablet (1 g total) by mouth 3 (three) times daily. 07/07/17  Yes Nandigam, Venia Minks, MD  cyanocobalamin (,VITAMIN B-12,) 1000 MCG/ML injection Inject 3m SQ once every 7 days x 4 then inject 1 ml SQ every 28 days. 09/26/17  Yes Nandigam, KVenia Minks MD  dronabinol (MARINOL) 2.5 MG capsule Take 2.5 mg by mouth 2 (two) times daily. 04/03/18  Yes [provider]  econazole nitrate 1 % cream APPLY  CREAM TOPICALLY ONCE DAILY Patient taking differently: Apply 1 application topically daily.  10/25/16  Yes NMauri Pole MD  gabapentin (NEURONTIN) 400 MG capsule Take 1 capsule by mouth at lunch, 2 at dinner and 3 at bedtime 08/27/16  Yes [provider]  hydrocortisone (ANUSOL-HC) 2.5 % rectal cream Place 1 application rectally 2 (two) times daily. 07/10/17  Yes Nandigam, KVenia Minks MD  hydrocortisone (CORTEF) 5 MG tablet Take 5  mg by mouth 2 (two) times daily. 03/04/17  Yes [provider]  hyoscyamine (LEVSIN SL) 0.125 MG SL tablet Place 0.125 mg under the tongue 4 (four) times daily.   Yes [provider]  levETIRAcetam (KEPPRA) 250 MG tablet Take 250 mg by mouth 2 (two) times daily. 02/12/16  Yes [provider]  lipase/protease/amylase (CREON) 36000 UNITS CPEP capsule Take 2  ( 72,000units) by mouth three times a day with meals one (36,000units) with snacks This is a dose increase for this -patient 08/17/17  Yes Nandigam, KVenia Minks MD  omeprazole (PRILOSEC) 40 MG capsule Take 1 capsule (40 mg total) by mouth daily. 09/20/17  Yes Nandigam, KVenia Minks MD  Oxycodone HCl 10 MG TABS Take 10 mg by mouth 5 (five) times daily as needed (pain). 1 tablet up to five times a day as needed for severe pain.  Yes [provider]  pramoxine-hydrocortisone (PROCTOCREAM-HC) 1-1 % rectal cream Place 1 application rectally 2 (two) times daily. 07/07/17  Yes Nandigam, Venia Minks, MD  prochlorperazine (COMPAZINE) 10 MG tablet Take 10 mg by mouth 2 (two) times daily. 04/26/18  Yes [provider]  propranolol ER (INDERAL LA) 80 MG 24 hr capsule Take 1 capsule by mouth daily. 08/22/16  Yes [provider]  RABEprazole (ACIPHEX) 20 MG tablet Take 20 mg by mouth daily.   Yes [provider]  tiZANidine (ZANAFLEX) 4 MG tablet Take 4 mg by mouth every 8 (eight) hours as needed for muscle spasms.    Yes [provider]  traZODone (DESYREL) 50 MG tablet Take 50 mg by mouth at bedtime.  03/14/17  Yes [provider]  vedolizumab (ENTYVIO) 300 MG injection Inject 300 mg into the vein every 8 (eight) weeks.   Yes [provider]  VITAMIN D PO Take 1 tablet by mouth daily.   Yes [provider]  zolmitriptan (ZOMIG) 5 MG nasal solution 1 spray in one nostril.  May repeat 1 spray once after 2 hours if needed. Not to exceed 2 sprays in 24 hours 04/11/16  Yes Jaffe,  Adam R, DO  Needles & Syringes MISC 1 Syringe by Does not apply route every 30 (thirty) days. 09/26/17   Mauri Pole, MD  promethazine (PHENERGAN) 25 MG tablet TAKE 1 TABLET BY MOUTH EVERY 6 HOURS AS NEEDED FOR NAUSEA AND VOMITING Patient not taking: Reported on 05/02/2018 03/27/18 05/02/18  Mauri Pole, MD    Physical Exam: Vitals:   05/02/18 0925 05/02/18 1326 05/02/18 1542 05/02/18 1839  BP:   (!) 184/98 (!) 184/105  Pulse:   81 75  Resp:   18 16  Temp:    98.6 F (37 C)  TempSrc:  Oral  Oral  SpO2:   95% 97%  Weight: 81.6 kg     Height: 5' 7"  (1.702 m)       General: Alert, Awake and Oriented to Time, Place and Person. Appear in moderate distress, affect flat Eyes: PERRL, Conjunctiva normal ENT: Oral Mucosa clear moist. Neck: no JVD, no Abnormal Mass Or lumps Cardiovascular: S1 and S2 Present, no Murmur, Peripheral Pulses Present Respiratory: normal respiratory effort, Bilateral Air entry equal and Decreased, no use of accessory muscle, Clear to Auscultation, no Crackles, no wheezes Abdomen: Bowel Sound present, Soft and diffuse tenderness, no hernia Skin: no redness, no Rash, no induration Extremities: no Pedal edema, no calf tenderness Neurologic: Grossly no focal neuro deficit. Bilaterally Equal motor strength  Labs on Admission:  CBC: Recent Labs  Lab 05/02/18 1037  WBC 5.0  HGB 14.6  HCT 45.7  MCV 92.1  PLT 384   Basic Metabolic Panel: Recent Labs  Lab 05/02/18 1037  NA 138  K 2.5*  CL 100  CO2 24  GLUCOSE 115*  BUN 7  CREATININE 0.86  CALCIUM 8.6*   GFR: Estimated Creatinine Clearance: 78.3 mL/min (by C-G formula based on SCr of 0.86 mg/dL). Liver Function Tests: Recent Labs  Lab 05/02/18 1037  AST 31  ALT 20  ALKPHOS 103  BILITOT 0.6  PROT 8.7*  ALBUMIN 4.3   Recent Labs  Lab 05/02/18 1037  LIPASE 37   No results for input(s): AMMONIA in the last 168 hours. Coagulation Profile: No results for input(s): INR, PROTIME in  the last 168 hours. Cardiac Enzymes: No results for input(s): CKTOTAL, CKMB, CKMBINDEX, TROPONINI in the last 168 hours.  BNP (last 3 results) No results for input(s): PROBNP in the last 8760 hours. HbA1C: No results for input(s): HGBA1C in the last 72 hours. CBG: No results for input(s): GLUCAP in the last 168 hours. Lipid Profile: No results for input(s): CHOL, HDL, LDLCALC, TRIG, CHOLHDL, LDLDIRECT in the last 72 hours. Thyroid Function Tests: No results for input(s): TSH, T4TOTAL, FREET4, T3FREE, THYROIDAB in the last 72 hours. Anemia Panel: No results for input(s): VITAMINB12, FOLATE, FERRITIN, TIBC, IRON, RETICCTPCT in the last 72 hours. Urine analysis:    Component Value Date/Time   COLORURINE YELLOW 05/02/2018 1049   APPEARANCEUR CLEAR 05/02/2018 1049   LABSPEC 1.012 05/02/2018 1049   PHURINE 6.0 05/02/2018 1049   GLUCOSEU NEGATIVE 05/02/2018 1049   HGBUR SMALL (A) 05/02/2018 1049   BILIRUBINUR NEGATIVE 05/02/2018 1049   KETONESUR 5 (A) 05/02/2018 1049   PROTEINUR 30 (A) 05/02/2018 1049   UROBILINOGEN 0.2 03/29/2013 0919   NITRITE NEGATIVE 05/02/2018 1049   LEUKOCYTESUR NEGATIVE 05/02/2018 1049    Radiological Exams on Admission: Ct Abdomen Pelvis W Contrast  Result Date: 05/02/2018 CLINICAL DATA:  Abdominal pain, diarrhea, back pain EXAM: CT ABDOMEN AND PELVIS WITH CONTRAST TECHNIQUE: Multidetector CT imaging of the abdomen and pelvis was performed using the standard protocol following bolus administration of intravenous contrast. CONTRAST:  145m ISOVUE-300 IOPAMIDOL (ISOVUE-300) INJECTION 61% COMPARISON:  05/18/2017 FINDINGS: Lower chest: No acute abnormality. Hepatobiliary: No focal liver abnormality is seen. Hepatic steatosis. Status post cholecystectomy. No biliary dilatation. Pancreas: Unremarkable. No pancreatic ductal dilatation or surrounding inflammatory changes. Spleen: Normal in size without focal abnormality. Adrenals/Urinary Tract: Adrenal glands are  unremarkable. Kidneys are normal, without renal calculi, focal lesion, or hydronephrosis. Bladder is unremarkable. Stomach/Bowel: Stomach is within normal limits. Evidence of prior partial right colon resection with fatty mural stratification of the underdistended transverse colon and to a lesser extent, the rectum. Vascular/Lymphatic: Calcific atherosclerosis. No enlarged abdominal or pelvic lymph nodes. Reproductive: No mass or other abnormality. Status post hysterectomy. Other: No abdominal wall hernia or abnormality. No abdominopelvic ascites. Musculoskeletal: No acute or significant osseous findings. IMPRESSION: 1. No acute CT findings of the abdomen and pelvis to explain pain or diarrhea. 2. Evidence of prior partial right colon resection with fatty mural stratification of the underdistended transverse colon and to a lesser extent, the rectum. Findings are generally consistent post resection findings of Crohn's disease without evidence of acute inflammation. 3.  Other chronic and incidental findings as detailed above. Electronically Signed   By: AEddie CandleM.D.   On: 05/02/2018 15:10   Dg Abd Acute W/chest  Result Date: 05/02/2018 CLINICAL DATA:  Nausea and vomiting for 3 weeks. EXAM: DG ABDOMEN ACUTE W/ 1V CHEST COMPARISON:  CT chest, abdomen and pelvis 05/18/2017. Chest and two views abdomen 05/15/2017. FINDINGS: There is no evidence of dilated bowel loops or free intraperitoneal air. No radiopaque calculi or other significant radiographic abnormality is seen. Heart size and mediastinal contours are within normal limits. Both lungs are clear. Spinal stimulator and cholecystectomy clips noted. Suture for surgical anastomosis in the right abdomen also seen. IMPRESSION: Negative exam. Electronically Signed   By: TInge RiseM.D.   On: 05/02/2018 12:40   EKG: Independently reviewed. normal sinus rhythm, nonspecific ST and T waves changes.  Assessment/Plan 1. Hypokalemia From GI losses. So far  is received 460M EQ of oral potassium, 30 mEq of IV potassium. We will start her on half-normal saline with potassium. Recheck the potassium right now and replace as needed. Also  recheck magnesium. Monitor on telemetry.  2.  Diarrhea. Nausea and vomiting. History of Crohn's colitis. History of chronic diarrhea secondary to pancreatic insufficiency. CT scan of the abdomen is negative for any acute abnormality. We will monitor clinically for now. Anticipate the diarrhea and the nausea and vomiting will get better. Continue with IV hydration. Continue home regimen of colestipol, Levsin, Phenergan, Compazine and Zofran. Monitor.  3.  Chronic pain syndrome. Chronic bowel syndrome Patient is requesting pain medication right now. Inform her that we will only continue her home regimen of gabapentin as well as oxycodone. PDMP was reviewed. Informed the patient that increasing her pain regimen will certainly make her abdominal pain as well as other GI symptoms worse.  4.  Adrenal insufficiency. Patient is on family grandma Cortef twice a day at home. We will resume 5 mg starting tomorrow and give her stress dose steroids tonight.  5.  Abnormal TSH. Reportedly high patient had abnormal TSH in the past. We will check TSH and free T4.  6.  Acute on chronic back pain. Patient reports that she and actually has some acute back pain. CT abdomen negative for any acute musculoskeletal abnormality. Suspect this is from multiple episodes of vomiting and diarrhea. We will monitor. Continue home regimen.  No radicular symptoms on my examination.  7.?  Seizure disorder. Patient is on Keppra 250 mg twice daily in the past. We will continue it for for now.  Nutrition: Regular diet DVT Prophylaxis: subcutaneous Heparin  Advance goals of care discussion: Full code  Consults: none  Family Communication: family was present at bedside, at the time of interview.  Disposition: Admitted as  observation telemetry'unit. Likely to be discharged home, in 1-2 days.  Author: Berle Mull, MD [facility] 05/02/2018  To reach On-call, see care teams to locate the attending and reach out to them via [email or web address]. If 7PM-7AM, please contact night-coverage If you still have difficulty reaching the attending provider, please page the [facility] (Director on Call) for [facility] on amion for assistance.

## 2018-05-02 NOTE — ED Notes (Signed)
Pt feweling a little better, continue to monitor, waiting for CT results, pt aware

## 2018-05-02 NOTE — ED Triage Notes (Signed)
Patient c/o abdominal pain, diarrhea, and back pain x 3 weeks. Patient reports symptoms have gotten progressively worse in the past 2-3 days. Patient denies any blood in her stool. Patient reports a history of Crohn's disease.

## 2018-05-02 NOTE — ED Provider Notes (Addendum)
[facility] DEPT Provider Note   CSN: [id number] Arrival date & time: 05/02/18  0905    History   Chief Complaint Chief Complaint  Patient presents with  . Abdominal Pain  . Back Pain  . Diarrhea    HPI [name] is a 58 y.o. female.   HPI Patient presents to the emergency room for evaluation of nausea vomiting diarrhea and back pain.  Patient has a history of Crohn's disease.  Patient states she stopped taking 1 of her medications several weeks ago because of issues with insurance not covering it anymore.  Patient has been in contact with her GI doctor and they have addressed that issue but she still has been without that medication for a few weeks.  Patient states she has been having episodes of nausea vomiting as well as frequent loose stools.  This has been going on for several weeks.  She has felt lightheaded and does not think she has been able to keep up with her fluid losses.  She has not been eating well for the last several days.  Today she also had a sharp pain in her back that she felt was worse than a kidney stone.  For this reason she came to the ED. Past Medical History:  Diagnosis Date  . Arthritis   . Chronic diarrhea   . Chronic disease anemia   . Crohn's disease ([place])   . Depression   . Family history of malignant neoplasm of gastrointestinal tract   . GERD (gastroesophageal reflux disease)   . H/O hiatal hernia   . History of adenomatous polyp of colon   . History of gastritis    AND HX ILEITIS  . History of kidney stones   . History of small bowel obstruction    SECONDARY CROHN'S STRICTURE  S/P COLECTOMY  . Hyperlipidemia   . Hypertension   . Hypopotassemia   . Internal hemorrhoids   . Kidney stones   . Pancreatic insufficiency   . PONV (postoperative nausea and vomiting)   . Psoriasis    SKIN  . Right ureteral stone   . Rotavirus infection 05/31/2013  . S/P dilatation of esophageal stricture   . Seasonal  asthma   . Urgency of urination   . Vitamin B12 deficiency     Patient Active Problem List   Diagnosis Date Noted  . Adrenal insufficiency (Addison's disease) ([place]) 05/02/2018  . Chronic back pain 05/02/2018  . Seizure ([place]) 05/02/2018  . Bile salt-induced diarrhea   . Nausea and vomiting 05/17/2017  . Dehydration   . Urinary tract infection without hematuria 05/16/2017  . Abnormal x-ray of abdomen   . CC (Crohn's colitis) ([place]) 03/03/2017  . Malnutrition of moderate degree 03/02/2017  . Crohn's colitis ([place]) 03/01/2017  . Weight loss 03/01/2017  . Crohn's disease (regional enteritis) ([place]) 07/19/2016  . Ileus ([place])   . Small bowel obstruction ([place])   . SBO (small bowel obstruction) ([place])   . Hyperglycemia 05/25/2016  . Encounter for imaging study to confirm nasogastric (NG) tube placement   . N&V (nausea and vomiting) 05/24/2016  . Pancreatic insufficiency   . Viral gastroenteritis 02/20/2016  . Hypokalemia 02/20/2016  . Hypomagnesemia 02/20/2016  . Colitis   . Low TSH level 09/26/2013  . Rotavirus infection 05/31/2013  . Protein-calorie malnutrition, severe ([place]) 05/30/2013  . Crohn's disease involving terminal ileum ([place]) 05/28/2013  . Skin lesion 12/14/2012  . Weight increase 08/31/2010  . Wears dentures 08/31/2010  .  Nausea & vomiting  08/31/2010  . IBS (irritable bowel syndrome) 08/31/2010  . Hiatal hernia 08/31/2010  . Reflux 08/31/2010  . Abdominal pain 08/31/2010  . Rectal bleeding 08/31/2010  . Hemorrhoids 08/31/2010  . Sinus problem/runny nose 08/31/2010  . Wears glasses 08/31/2010  . Asthma 08/30/2010  . COUGH 04/02/2010  . Crohn's disease ([place]) 02/03/2009  . DIARRHEA 02/03/2009  . VITAMIN B12 DEFICIENCY 12/17/2008  . SBO 09/12/2008  . PSORIASIS 11/07/2007  . DEPRESSION 08/31/2007  . HEARING LOSS 08/31/2007  . GERD (gastroesophageal reflux disease) 08/31/2007  . HIATAL HERNIA 08/31/2007  . COLONIC POLYPS, ADENOMATOUS, HX OF 08/31/2007    Past  Surgical History:  Procedure Laterality Date  . ANAL FISSURE REPAIR  1990's  . CARPAL TUNNEL RELEASE Right 2000  . CHOLECYSTECTOMY  2002  . CYSTOSCOPY WITH RETROGRADE PYELOGRAM, URETEROSCOPY AND STENT PLACEMENT Left 03/08/2013   Procedure: CYSTOSCOPY WITH RETROGRADE PYELOGRAM, URETEROSCOPY AND STENT PLACEMENT stone basketing;  Surgeon: Alexis Frock, MD;  Location: [facility];  Service: Urology;  Laterality: Left;  . CYSTOSCOPY WITH RETROGRADE PYELOGRAM, URETEROSCOPY AND STENT PLACEMENT Right 04/03/2013   Procedure: CYSTOSCOPY WITH RETROGRADE PYELOGRAM, URETEROSCOPY AND STENT PLACEMENT;  Surgeon: Alexis Frock, MD;  Location: [facility];  Service: Urology;  Laterality: Right;  . DX LAPAROSCOPY CONVERTED TO OPEN RIGHT COLECCTOMY  09-15-2010   ANASTOMOTIC STRICTURE  . LAPAROSCOPIC ILEOCECECTOMY  10-09-2008   AND APPENDECTOMY (TERMINAL ILEITIS & PARTIAL SBO)  . SPINAL CORD STIMULATOR IMPLANT Left 05/2017  . VAGINAL HYSTERECTOMY  1992     OB History   No obstetric history on file.      Home Medications    Prior to Admission medications   Medication Sig Start Date End Date Taking? Authorizing Provider  albuterol (PROAIR HFA) 108 (90 BASE) MCG/ACT inhaler Inhale 2 puffs into the lungs every 4 (four) hours as needed for wheezing or shortness of breath.    Yes [provider]  clobetasol cream (TEMOVATE) 1.61 % Apply 1 application topically 2 (two) times daily.  03/12/17  Yes [provider]  colestipol (COLESTID) 1 g tablet Take 1 tablet (1 g total) by mouth 3 (three) times daily. 07/07/17  Yes Nandigam, Venia Minks, MD  cyanocobalamin (,VITAMIN B-12,) 1000 MCG/ML injection Inject 66m SQ once every 7 days x 4 then inject 1 ml SQ every 28 days. 09/26/17  Yes Nandigam, KVenia Minks MD  dronabinol (MARINOL) 2.5 MG capsule Take 2.5 mg by mouth 2 (two) times daily. 04/03/18  Yes [provider]  econazole nitrate 1 % cream APPLY  CREAM TOPICALLY ONCE DAILY Patient  taking differently: Apply 1 application topically daily.  10/25/16  Yes NMauri Pole MD  gabapentin (NEURONTIN) 400 MG capsule Take 1 capsule by mouth at lunch, 2 at dinner and 3 at bedtime 08/27/16  Yes [provider]  hydrocortisone (ANUSOL-HC) 2.5 % rectal cream Place 1 application rectally 2 (two) times daily. 07/10/17  Yes Nandigam, KVenia Minks MD  hydrocortisone (CORTEF) 5 MG tablet Take 5 mg by mouth 2 (two) times daily. 03/04/17  Yes [provider]  hyoscyamine (LEVSIN SL) 0.125 MG SL tablet Place 0.125 mg under the tongue 4 (four) times daily.   Yes [provider]  levETIRAcetam (KEPPRA) 250 MG tablet Take 250 mg by mouth 2 (two) times daily. 02/12/16  Yes [provider]  lipase/protease/amylase (CREON) 36000 UNITS CPEP capsule Take 2  ( 72,000units) by mouth three times a day with meals one (36,000units) with snacks This is  a dose increase for this -patient 08/17/17  Yes Nandigam, Venia Minks, MD  omeprazole (PRILOSEC) 40 MG capsule Take 1 capsule (40 mg total) by mouth daily. 09/20/17  Yes Nandigam, Venia Minks, MD  Oxycodone HCl 10 MG TABS Take 10 mg by mouth 5 (five) times daily as needed (pain). 1 tablet up to five times a day as needed for severe pain.    Yes [provider]  pramoxine-hydrocortisone (PROCTOCREAM-HC) 1-1 % rectal cream Place 1 application rectally 2 (two) times daily. 07/07/17  Yes Nandigam, Venia Minks, MD  prochlorperazine (COMPAZINE) 10 MG tablet Take 10 mg by mouth 2 (two) times daily. 04/26/18  Yes [provider]  propranolol ER (INDERAL LA) 80 MG 24 hr capsule Take 1 capsule by mouth daily. 08/22/16  Yes [provider]  RABEprazole (ACIPHEX) 20 MG tablet Take 20 mg by mouth daily.   Yes [provider]  tiZANidine (ZANAFLEX) 4 MG tablet Take 4 mg by mouth every 8 (eight) hours as needed for muscle spasms.    Yes [provider]  traZODone (DESYREL) 50 MG tablet Take 50 mg by mouth at  bedtime.  03/14/17  Yes [provider]  vedolizumab (ENTYVIO) 300 MG injection Inject 300 mg into the vein every 8 (eight) weeks.   Yes [provider]  VITAMIN D PO Take 1 tablet by mouth daily.   Yes [provider]  zolmitriptan (ZOMIG) 5 MG nasal solution 1 spray in one nostril.  May repeat 1 spray once after 2 hours if needed. Not to exceed 2 sprays in 24 hours 04/11/16  Yes Jaffe, Adam R, DO  Needles & Syringes MISC 1 Syringe by Does not apply route every 30 (thirty) days. 09/26/17   Mauri Pole, MD  promethazine (PHENERGAN) 25 MG tablet TAKE 1 TABLET BY MOUTH EVERY 6 HOURS AS NEEDED FOR NAUSEA AND VOMITING Patient not taking: Reported on 05/02/2018 03/27/18 05/02/18  Mauri Pole, MD    Family History Family History  Problem Relation Age of Onset  . Colon cancer Mother   . Colon polyps Mother   . Colon polyps Father   . Lung cancer Father   . Breast cancer Paternal Grandmother   . Colon polyps Brother   . Thyroid disease Neg Hx   . Stomach cancer Neg Hx   . Pancreatic cancer Neg Hx     Social History Social History   Tobacco Use  . Smoking status: Former Smoker    Packs/day: 1.00    Years: 10.00    Pack years: 10.00    Types: Cigarettes    Last attempt to quit: 03/07/1984    Years since quitting: 34.1  . Smokeless tobacco: Never Used  Substance Use Topics  . Alcohol use: No    Alcohol/week: 0.0 standard drinks  . Drug use: No     Allergies   Codeine; Amitriptyline; and Humira [adalimumab]   Review of Systems Review of Systems  All other systems reviewed and are negative.    Physical Exam Updated Vital Signs BP (!) 147/90 (BP Location: Right Arm)   Pulse 60   Temp 99.3 F (37.4 C) (Oral)   Resp 16   Ht 1.702 m (5' 7" )   Wt 82.9 kg   SpO2 98%   BMI 28.62 kg/m   Physical Exam Vitals signs and nursing note reviewed.  Constitutional:      General: She is not in acute distress.    Appearance: She is well-developed.  HENT:     Head: Normocephalic and atraumatic.     Right Ear: External ear normal.     Left Ear: External ear normal.  Eyes:     General: No scleral icterus.       Right eye: No discharge.        Left eye: No discharge.     Conjunctiva/sclera: Conjunctivae normal.  Neck:     Musculoskeletal: Neck supple.     Trachea: No tracheal deviation.  Cardiovascular:     Rate and Rhythm: Normal rate and regular rhythm.  Pulmonary:     Effort: Pulmonary effort is normal. No respiratory distress.     Breath sounds: Normal breath sounds. No stridor. No wheezing or rales.  Abdominal:     General: Bowel sounds are normal. There is no distension.     Palpations: Abdomen is soft. There is no mass.     Tenderness: There is abdominal tenderness in the epigastric area. There is no guarding or rebound.  Musculoskeletal:        General: No tenderness.  Skin:    General: Skin is warm and dry.     Findings: No rash.  Neurological:     Mental Status: She is alert.     Cranial Nerves: No cranial nerve deficit (no facial droop, extraocular movements intact, no slurred speech).     Sensory: No sensory deficit.     Motor: No abnormal muscle tone or seizure activity.     Coordination: Coordination normal.      ED Treatments / Results  Labs (all labs ordered are listed, but only abnormal results are displayed) Labs Reviewed  COMPREHENSIVE METABOLIC PANEL - Abnormal; Notable for the following components:      Result Value   Potassium 2.5 (*)    Glucose, Bld 115 (*)    Calcium 8.6 (*)    Total Protein 8.7 (*)    All other components within normal limits  URINALYSIS, ROUTINE W REFLEX MICROSCOPIC - Abnormal; Notable for the following components:   Hgb urine dipstick SMALL (*)    Ketones, ur 5 (*)    Protein, ur 30 (*)    Bacteria, UA FEW (*)    All other components within normal limits  COMPREHENSIVE METABOLIC PANEL - Abnormal; Notable for the following components:   Potassium 2.9 (*)    Calcium  8.0 (*)    All other components within normal limits  MAGNESIUM - Abnormal; Notable for the following components:   Magnesium 1.5 (*)    All other components within normal limits  COMPREHENSIVE METABOLIC PANEL - Abnormal; Notable for the following components:   Potassium 3.4 (*)    Glucose, Bld 100 (*)    BUN 5 (*)    Calcium 8.1 (*)    All other components within normal limits  CBC - Abnormal; Notable for the following components:   Platelets 140 (*)    All other components within normal limits  TSH - Abnormal; Notable for the following components:   TSH 0.307 (*)    All other components within normal limits  LIPASE, BLOOD  CBC  T4, FREE  HIV ANTIBODY (ROUTINE TESTING W REFLEX)     Radiology Ct Abdomen Pelvis W Contrast  Result Date: 05/02/2018 CLINICAL DATA:  Abdominal pain, diarrhea, back pain EXAM: CT ABDOMEN AND PELVIS WITH CONTRAST TECHNIQUE: Multidetector CT imaging of the abdomen and pelvis was performed using the standard protocol following bolus administration of intravenous contrast. CONTRAST:  114m ISOVUE-300 IOPAMIDOL (ISOVUE-300)  INJECTION 61% COMPARISON:  05/18/2017 FINDINGS: Lower chest: No acute abnormality. Hepatobiliary: No focal liver abnormality is seen. Hepatic steatosis. Status post cholecystectomy. No biliary dilatation. Pancreas: Unremarkable. No pancreatic ductal dilatation or surrounding inflammatory changes. Spleen: Normal in size without focal abnormality. Adrenals/Urinary Tract: Adrenal glands are unremarkable. Kidneys are normal, without renal calculi, focal lesion, or hydronephrosis. Bladder is unremarkable. Stomach/Bowel: Stomach is within normal limits. Evidence of prior partial right colon resection with fatty mural stratification of the underdistended transverse colon and to a lesser extent, the rectum. Vascular/Lymphatic: Calcific atherosclerosis. No enlarged abdominal or pelvic lymph nodes. Reproductive: No mass or other abnormality. Status post  hysterectomy. Other: No abdominal wall hernia or abnormality. No abdominopelvic ascites. Musculoskeletal: No acute or significant osseous findings. IMPRESSION: 1. No acute CT findings of the abdomen and pelvis to explain pain or diarrhea. 2. Evidence of prior partial right colon resection with fatty mural stratification of the underdistended transverse colon and to a lesser extent, the rectum. Findings are generally consistent post resection findings of Crohn's disease without evidence of acute inflammation. 3.  Other chronic and incidental findings as detailed above. Electronically Signed   By: Eddie Candle M.D.   On: 05/02/2018 15:10   Dg Abd Acute W/chest  Result Date: 05/02/2018 CLINICAL DATA:  Nausea and vomiting for 3 weeks. EXAM: DG ABDOMEN ACUTE W/ 1V CHEST COMPARISON:  CT chest, abdomen and pelvis 05/18/2017. Chest and two views abdomen 05/15/2017. FINDINGS: There is no evidence of dilated bowel loops or free intraperitoneal air. No radiopaque calculi or other significant radiographic abnormality is seen. Heart size and mediastinal contours are within normal limits. Both lungs are clear. Spinal stimulator and cholecystectomy clips noted. Suture for surgical anastomosis in the right abdomen also seen. IMPRESSION: Negative exam. Electronically Signed   By: Inge Rise M.D.   On: 05/02/2018 12:40    Procedures .Critical Care Performed by: Dorie Rank, MD Authorized by: Dorie Rank, MD   Critical care provider statement:    Critical care time (minutes):  30   Critical care was time spent personally by me on the following activities:  Discussions with consultants, evaluation of patient's response to treatment, examination of patient, ordering and performing treatments and interventions, ordering and review of laboratory studies, ordering and review of radiographic studies, pulse oximetry, re-evaluation of patient's condition, obtaining history from patient or surrogate and review of old  charts   (including critical care time)  Medications Ordered in ED Medications  sodium chloride flush (NS) 0.9 % injection 3 mL ( Intravenous Canceled Entry 05/02/18 1050)  sodium chloride 0.9 % bolus 1,000 mL (0 mLs Intravenous Stopped 05/02/18 1228)    And  0.9 %  sodium chloride infusion (125 mL/hr Intravenous New Bag/Given 05/02/18 1228)  iopamidol (ISOVUE-300) 61 % injection (has no administration in time range)  sodium chloride (PF) 0.9 % injection (has no administration in time range)  albuterol (PROVENTIL) (2.5 MG/3ML) 0.083% nebulizer solution 2.5 mg (has no administration in time range)  clobetasol cream (TEMOVATE) 7.78 % 1 application (1 application Topical Given 05/02/18 2237)  colestipol (COLESTID) tablet 1 g (1 g Oral Given 05/02/18 2237)  dronabinol (MARINOL) capsule 2.5 mg (has no administration in time range)  hydrocortisone (ANUSOL-HC) 2.5 % rectal cream 1 application (1 application Rectal Given 05/02/18 2236)  hydrocortisone (CORTEF) tablet 5 mg (has no administration in time range)  hydrocortisone sodium succinate (SOLU-CORTEF) 100 MG injection 50 mg (50 mg Intravenous Given 05/03/18 0054)  hyoscyamine (LEVSIN SL) SL tablet 0.125 mg (  0.125 mg Sublingual Given 05/02/18 2238)  levETIRAcetam (KEPPRA) tablet 250 mg (250 mg Oral Given 05/02/18 2238)  pantoprazole (PROTONIX) EC tablet 40 mg (40 mg Oral Given 05/02/18 2019)  oxyCODONE (Oxy IR/ROXICODONE) immediate release tablet 10 mg (10 mg Oral Given 05/03/18 0345)  prochlorperazine (COMPAZINE) tablet 10 mg (10 mg Oral Given 05/02/18 2238)  propranolol ER (INDERAL LA) 24 hr capsule 80 mg (80 mg Oral Given 05/02/18 2019)  tiZANidine (ZANAFLEX) tablet 4 mg (has no administration in time range)  traZODone (DESYREL) tablet 50 mg (50 mg Oral Given 05/02/18 2238)  heparin injection 5,000 Units (5,000 Units Subcutaneous Given 05/03/18 0640)  0.45 % NaCl with KCl 20 mEq / L infusion ( Intravenous New Bag/Given 05/02/18 2016)  acetaminophen  (TYLENOL) tablet 650 mg (has no administration in time range)    Or  acetaminophen (TYLENOL) suppository 650 mg (has no administration in time range)  ondansetron (ZOFRAN) tablet 4 mg (4 mg Oral Given 05/03/18 0345)    Or  ondansetron (ZOFRAN) injection 4 mg ( Intravenous See Alternative 05/03/18 0345)  promethazine (PHENERGAN) injection 12.5 mg (has no administration in time range)  gabapentin (NEURONTIN) capsule 400 mg (has no administration in time range)    And  gabapentin (NEURONTIN) capsule 800 mg (has no administration in time range)    And  gabapentin (NEURONTIN) capsule 1,200 mg (1,200 mg Oral Given 05/02/18 2237)  lipase/protease/amylase (CREON) capsule 72,000 Units (has no administration in time range)  lipase/protease/amylase (CREON) capsule 36,000 Units (has no administration in time range)  ondansetron (ZOFRAN) injection 4 mg (4 mg Intravenous Given 05/02/18 1129)  fentaNYL (SUBLIMAZE) injection 75 mcg (75 mcg Intravenous Given 05/02/18 1129)  potassium chloride SA (K-DUR,KLOR-CON) CR tablet 40 mEq (40 mEq Oral Given 05/02/18 1152)  potassium chloride 10 mEq in 100 mL IVPB (0 mEq Intravenous Stopped 05/02/18 1308)  prochlorperazine (COMPAZINE) injection 10 mg (10 mg Intravenous Given 05/02/18 1243)  ketorolac (TORADOL) 30 MG/ML injection 30 mg (30 mg Intravenous Given 05/02/18 1243)  iopamidol (ISOVUE-300) 61 % injection 100 mL (100 mLs Intravenous Contrast Given 05/02/18 1433)     Initial Impression / Assessment and Plan / ED Course  I have reviewed the triage vital signs and the nursing notes.  Pertinent labs & imaging results that were available during my care of the patient were reviewed by me and considered in my medical decision making (see chart for details).  Clinical Course as of May 03 809  Wed May 02, 2018  1107 Previous records reviewed.  Patient was at [facility] for similar symptoms approximately 1 week ago   [JK]    Clinical Course User Index [JK] Dorie Rank, MD     Labs notable for significant hypokalemia from her nausea and vomiting.  No sign of obstruction or other acute process on CT.  With her degree of hypokalemia will consult for admission for overnight hydration, electrolyte correction.  Final Clinical Impressions(s) / ED Diagnoses   Final diagnoses:  Nausea and vomiting, intractability of vomiting not specified, unspecified vomiting type  Hypokalemia      Dorie Rank, MD 05/03/18 4970    Dorie Rank, MD 05/15/18 1655

## 2018-05-03 DIAGNOSIS — K8689 Other specified diseases of pancreas: Secondary | ICD-10-CM

## 2018-05-03 DIAGNOSIS — E271 Primary adrenocortical insufficiency: Secondary | ICD-10-CM

## 2018-05-03 DIAGNOSIS — R112 Nausea with vomiting, unspecified: Secondary | ICD-10-CM

## 2018-05-03 DIAGNOSIS — E876 Hypokalemia: Secondary | ICD-10-CM | POA: Diagnosis not present

## 2018-05-03 LAB — CBC
HCT: 40.9 % (ref 36.0–46.0)
Hemoglobin: 12.6 g/dL (ref 12.0–15.0)
MCH: 29.2 pg (ref 26.0–34.0)
MCHC: 30.8 g/dL (ref 30.0–36.0)
MCV: 94.7 fL (ref 80.0–100.0)
Platelets: 140 10*3/uL — ABNORMAL LOW (ref 150–400)
RBC: 4.32 MIL/uL (ref 3.87–5.11)
RDW: 12.4 % (ref 11.5–15.5)
WBC: 4 10*3/uL (ref 4.0–10.5)
nRBC: 0 % (ref 0.0–0.2)

## 2018-05-03 LAB — TSH: TSH: 0.307 u[IU]/mL — AB (ref 0.350–4.500)

## 2018-05-03 LAB — COMPREHENSIVE METABOLIC PANEL
ALT: 21 U/L (ref 0–44)
AST: 36 U/L (ref 15–41)
Albumin: 3.5 g/dL (ref 3.5–5.0)
Alkaline Phosphatase: 83 U/L (ref 38–126)
Anion gap: 8 (ref 5–15)
BUN: 5 mg/dL — AB (ref 6–20)
CO2: 24 mmol/L (ref 22–32)
Calcium: 8.1 mg/dL — ABNORMAL LOW (ref 8.9–10.3)
Chloride: 107 mmol/L (ref 98–111)
Creatinine, Ser: 0.79 mg/dL (ref 0.44–1.00)
GFR calc Af Amer: >60 mL/min (ref >60)
GFR calc non Af Amer: >60 mL/min (ref >60)
Glucose, Bld: 100 mg/dL — ABNORMAL HIGH (ref 70–99)
Potassium: 3.4 mmol/L — ABNORMAL LOW (ref 3.5–5.1)
Sodium: 139 mmol/L (ref 135–145)
Total Bilirubin: 0.7 mg/dL (ref 0.3–1.2)
Total Protein: 6.8 g/dL (ref 6.5–8.1)

## 2018-05-03 LAB — HIV ANTIBODY (ROUTINE TESTING W REFLEX): HIV Screen 4th Generation wRfx: NONREACTIVE

## 2018-05-03 LAB — MAGNESIUM: Magnesium: 1.5 mg/dL — ABNORMAL LOW (ref 1.7–2.4)

## 2018-05-03 MED ORDER — POTASSIUM CHLORIDE CRYS ER 20 MEQ PO TBCR
20.0000 meq | EXTENDED_RELEASE_TABLET | Freq: Every day | ORAL | Status: AC
  Administered 2018-05-04 – 2018-05-05 (×2): 20 meq via ORAL
  Filled 2018-05-03 (×3): qty 1

## 2018-05-03 MED ORDER — MAGNESIUM SULFATE 50 % IJ SOLN
3.0000 g | Freq: Once | INTRAVENOUS | Status: AC
  Administered 2018-05-03: 3 g via INTRAVENOUS
  Filled 2018-05-03: qty 6

## 2018-05-03 MED ORDER — POTASSIUM CHLORIDE CRYS ER 20 MEQ PO TBCR
40.0000 meq | EXTENDED_RELEASE_TABLET | Freq: Once | ORAL | Status: AC
  Administered 2018-05-03: 40 meq via ORAL
  Filled 2018-05-03: qty 2

## 2018-05-03 NOTE — Progress Notes (Signed)
PROGRESS NOTE    [name]  DEY:[id number] DOB: 06-Jun-1960 DOA: 05/02/2018 PCP: Rochel Brome, MD    Brief Narrative:  [name] is a 58 y.o. female with Past medical history of Crohn's disease, cyclical vomiting syndrome, gastroparesis, GERD, HLD, B12 deficiency. Patient comes to the hospital with complaints of nausea vomiting and diarrhea.  She was seen in the [facility] ER on 04/27/2018 where she was able to tolerate p.o. diet and was discharged home.  Comes to the hospital again as she continues to have vomiting and unable to take anything down . CT abdomen was performed, no acute abnormalities were seen.  Patient was seen to have hypokalemia and metabolic acidosis and therefore was referred for admission.  Assessment & Plan:   Principal Problem:   Hypokalemia Active Problems:   GERD (gastroesophageal reflux disease)   Hemorrhoids   Pancreatic insufficiency   CC (Crohn's colitis) (HCC)   Adrenal insufficiency (Addison's disease) (HCC)   Chronic back pain   Seizure ([place])   Gastroenteritis suspect viral vs crohn's flare up.  - pt reports having upto 8 watery bowel movements since 3 am today.  - pt continues to have persistent nausea, though vomiting has resolved.  - pt reports clear liquid diet is making her nausea more and she would like to try soft diet and see if she can tolerate.  - recommend to continue IV fluids for another 24 hours in view of her persistent diarrhea.  - symptomatic management with IV anti emetics and pain control.    H/o chronic pain syndrome:  - resume home dose of gabapentin and oxycodone.    H/o Adrenal insufficiency:  - resume home cortef.    H/o pancreatic insufficiency:  - continue with creon.    Hypokalemia and hypomagnesemia:  Replaced by IV and oral route.  Repeat in am.     Seizure:  None this admission, resume keppra.    GERD: On oral protonix.    DVT prophylaxis: heparin.  Code Status: full  code.  Family Communication: none at bedside.  Disposition Plan:  Pending improvement in the diarrhea.  Consultants:   None.   Procedures:NOne.  Antimicrobials: (None.   Subjective: No chest pain or sob,  Reports abdominal discomfort. Had 8 watery bowel movements since 3 am this morning.  Persistent nausea, no vomiting though. Feels fatigued.   Objective: Vitals:   05/02/18 1859 05/02/18 2127 05/03/18 0539 05/03/18 1248  BP:  (!) 175/85 (!) 147/90 (!) 162/86  Pulse:  72 60 (!) 58  Resp:  16 16 18   Temp:  98.6 F (37 C) 99.3 F (37.4 C) 99.9 F (37.7 C)  TempSrc:  Oral Oral Oral  SpO2:  96% 98% 95%  Weight: 84 kg  82.9 kg   Height: 5' 7"  (1.702 m)       Intake/Output Summary (Last 24 hours) at 05/03/2018 1459 Last data filed at 05/03/2018 0847 Gross per 24 hour  Intake 1435 ml  Output -  Net 1435 ml   Filed Weights   05/02/18 0925 05/02/18 1859 05/03/18 0539  Weight: 81.6 kg 84 kg 82.9 kg    Examination:  General exam: Appears calm and comfortable, not in distress.  Respiratory system: Clear to auscultation. Respiratory effort normal. Cardiovascular system: S1 & S2 heard, RRR. No JVD, murmurs, No pedal edema. Gastrointestinal system: Abdomen is soft , mildly tender in the epigastric area, no distention, bowel sounds good.  Central nervous system: Alert and oriented. No focal neurological  deficits. Extremities: Symmetric 5 x 5 power. Skin: No rashes, lesions or ulcers Psychiatry: Mood & affect appropriate.     Data Reviewed: I have personally reviewed following labs and imaging studies  CBC: Recent Labs  Lab 05/02/18 1037 05/03/18 0350  WBC 5.0 4.0  HGB 14.6 12.6  HCT 45.7 40.9  MCV 92.1 94.7  PLT 175 169*   Basic Metabolic Panel: Recent Labs  Lab 05/02/18 1037 05/02/18 1906 05/03/18 0350  NA 138 140 139  K 2.5* 2.9* 3.4*  CL 100 108 107  CO2 24 24 24   GLUCOSE 115* 96 100*  BUN 7 6 5*  CREATININE 0.86 0.83 0.79  CALCIUM 8.6* 8.0* 8.1*    MG  --  1.5* 1.5*   GFR: Estimated Creatinine Clearance: 84.8 mL/min (by C-G formula based on SCr of 0.79 mg/dL). Liver Function Tests: Recent Labs  Lab 05/02/18 1037 05/02/18 1906 05/03/18 0350  AST 31 30 36  ALT 20 20 21   ALKPHOS 103 85 83  BILITOT 0.6 0.7 0.7  PROT 8.7* 7.2 6.8  ALBUMIN 4.3 3.8 3.5   Recent Labs  Lab 05/02/18 1037  LIPASE 37   No results for input(s): AMMONIA in the last 168 hours. Coagulation Profile: No results for input(s): INR, PROTIME in the last 168 hours. Cardiac Enzymes: No results for input(s): CKTOTAL, CKMB, CKMBINDEX, TROPONINI in the last 168 hours. BNP (last 3 results) No results for input(s): PROBNP in the last 8760 hours. HbA1C: No results for input(s): HGBA1C in the last 72 hours. CBG: No results for input(s): GLUCAP in the last 168 hours. Lipid Profile: No results for input(s): CHOL, HDL, LDLCALC, TRIG, CHOLHDL, LDLDIRECT in the last 72 hours. Thyroid Function Tests: Recent Labs    05/02/18 1900  TSH 0.307*  FREET4 0.94   Anemia Panel: No results for input(s): VITAMINB12, FOLATE, FERRITIN, TIBC, IRON, RETICCTPCT in the last 72 hours. Sepsis Labs: No results for input(s): PROCALCITON, LATICACIDVEN in the last 168 hours.  No results found for this or any previous visit (from the past 240 hour(s)).       Radiology Studies: Ct Abdomen Pelvis W Contrast  Result Date: 05/02/2018 CLINICAL DATA:  Abdominal pain, diarrhea, back pain EXAM: CT ABDOMEN AND PELVIS WITH CONTRAST TECHNIQUE: Multidetector CT imaging of the abdomen and pelvis was performed using the standard protocol following bolus administration of intravenous contrast. CONTRAST:  159m ISOVUE-300 IOPAMIDOL (ISOVUE-300) INJECTION 61% COMPARISON:  05/18/2017 FINDINGS: Lower chest: No acute abnormality. Hepatobiliary: No focal liver abnormality is seen. Hepatic steatosis. Status post cholecystectomy. No biliary dilatation. Pancreas: Unremarkable. No pancreatic ductal  dilatation or surrounding inflammatory changes. Spleen: Normal in size without focal abnormality. Adrenals/Urinary Tract: Adrenal glands are unremarkable. Kidneys are normal, without renal calculi, focal lesion, or hydronephrosis. Bladder is unremarkable. Stomach/Bowel: Stomach is within normal limits. Evidence of prior partial right colon resection with fatty mural stratification of the underdistended transverse colon and to a lesser extent, the rectum. Vascular/Lymphatic: Calcific atherosclerosis. No enlarged abdominal or pelvic lymph nodes. Reproductive: No mass or other abnormality. Status post hysterectomy. Other: No abdominal wall hernia or abnormality. No abdominopelvic ascites. Musculoskeletal: No acute or significant osseous findings. IMPRESSION: 1. No acute CT findings of the abdomen and pelvis to explain pain or diarrhea. 2. Evidence of prior partial right colon resection with fatty mural stratification of the underdistended transverse colon and to a lesser extent, the rectum. Findings are generally consistent post resection findings of Crohn's disease without evidence of acute inflammation. 3.  Other chronic  and incidental findings as detailed above. Electronically Signed   By: Eddie Candle M.D.   On: 05/02/2018 15:10   Dg Abd Acute W/chest  Result Date: 05/02/2018 CLINICAL DATA:  Nausea and vomiting for 3 weeks. EXAM: DG ABDOMEN ACUTE W/ 1V CHEST COMPARISON:  CT chest, abdomen and pelvis 05/18/2017. Chest and two views abdomen 05/15/2017. FINDINGS: There is no evidence of dilated bowel loops or free intraperitoneal air. No radiopaque calculi or other significant radiographic abnormality is seen. Heart size and mediastinal contours are within normal limits. Both lungs are clear. Spinal stimulator and cholecystectomy clips noted. Suture for surgical anastomosis in the right abdomen also seen. IMPRESSION: Negative exam. Electronically Signed   By: Inge Rise M.D.   On: 05/02/2018 12:40         Scheduled Meds: . clobetasol cream  1 application Topical BID  . colestipol  1 g Oral TID  . dronabinol  2.5 mg Oral BID AC  . gabapentin  400 mg Oral Q lunch   And  . gabapentin  800 mg Oral Q supper   And  . gabapentin  1,200 mg Oral QHS  . heparin  5,000 Units Subcutaneous Q8H  . hydrocortisone  1 application Rectal BID  . hydrocortisone  5 mg Oral BID  . hyoscyamine  0.125 mg Sublingual QID  . levETIRAcetam  250 mg Oral BID  . lipase/protease/amylase  72,000 Units Oral TID WC  . pantoprazole  40 mg Oral Daily  . [START ON 05/04/2018] potassium chloride  20 mEq Oral Daily  . prochlorperazine  10 mg Oral BID  . propranolol ER  80 mg Oral Daily  . sodium chloride flush  3 mL Intravenous Once  . traZODone  50 mg Oral QHS   Continuous Infusions: . 0.45 % NaCl with KCl 20 mEq / L 75 mL/hr at 05/03/18 1053  . sodium chloride 125 mL/hr (05/02/18 1228)     LOS: 0 days    Time spent: 29 minutes.     Hosie Poisson, MD [facility] Pager [phone]  If 7PM-7AM, please contact night-coverage [email or web address] Password [facility] 05/03/2018, 2:59 PM

## 2018-05-04 DIAGNOSIS — K219 Gastro-esophageal reflux disease without esophagitis: Secondary | ICD-10-CM

## 2018-05-04 DIAGNOSIS — G40909 Epilepsy, unspecified, not intractable, without status epilepticus: Secondary | ICD-10-CM | POA: Diagnosis present

## 2018-05-04 DIAGNOSIS — K3184 Gastroparesis: Secondary | ICD-10-CM | POA: Diagnosis present

## 2018-05-04 DIAGNOSIS — M1990 Unspecified osteoarthritis, unspecified site: Secondary | ICD-10-CM | POA: Diagnosis present

## 2018-05-04 DIAGNOSIS — G894 Chronic pain syndrome: Secondary | ICD-10-CM | POA: Diagnosis present

## 2018-05-04 DIAGNOSIS — Z800 Family history of malignant neoplasm of digestive organs: Secondary | ICD-10-CM | POA: Diagnosis not present

## 2018-05-04 DIAGNOSIS — R112 Nausea with vomiting, unspecified: Secondary | ICD-10-CM | POA: Diagnosis not present

## 2018-05-04 DIAGNOSIS — Z87891 Personal history of nicotine dependence: Secondary | ICD-10-CM | POA: Diagnosis not present

## 2018-05-04 DIAGNOSIS — Z803 Family history of malignant neoplasm of breast: Secondary | ICD-10-CM | POA: Diagnosis not present

## 2018-05-04 DIAGNOSIS — I10 Essential (primary) hypertension: Secondary | ICD-10-CM | POA: Diagnosis present

## 2018-05-04 DIAGNOSIS — K5010 Crohn's disease of large intestine without complications: Secondary | ICD-10-CM | POA: Diagnosis present

## 2018-05-04 DIAGNOSIS — Z79891 Long term (current) use of opiate analgesic: Secondary | ICD-10-CM | POA: Diagnosis not present

## 2018-05-04 DIAGNOSIS — Z801 Family history of malignant neoplasm of trachea, bronchus and lung: Secondary | ICD-10-CM | POA: Diagnosis not present

## 2018-05-04 DIAGNOSIS — K8689 Other specified diseases of pancreas: Secondary | ICD-10-CM | POA: Diagnosis not present

## 2018-05-04 DIAGNOSIS — Z8371 Family history of colonic polyps: Secondary | ICD-10-CM | POA: Diagnosis not present

## 2018-05-04 DIAGNOSIS — N179 Acute kidney failure, unspecified: Secondary | ICD-10-CM | POA: Diagnosis present

## 2018-05-04 DIAGNOSIS — D638 Anemia in other chronic diseases classified elsewhere: Secondary | ICD-10-CM | POA: Diagnosis present

## 2018-05-04 DIAGNOSIS — Z888 Allergy status to other drugs, medicaments and biological substances status: Secondary | ICD-10-CM | POA: Diagnosis not present

## 2018-05-04 DIAGNOSIS — E785 Hyperlipidemia, unspecified: Secondary | ICD-10-CM | POA: Diagnosis present

## 2018-05-04 DIAGNOSIS — E876 Hypokalemia: Secondary | ICD-10-CM | POA: Diagnosis present

## 2018-05-04 DIAGNOSIS — Z885 Allergy status to narcotic agent status: Secondary | ICD-10-CM | POA: Diagnosis not present

## 2018-05-04 DIAGNOSIS — E271 Primary adrenocortical insufficiency: Secondary | ICD-10-CM | POA: Diagnosis present

## 2018-05-04 DIAGNOSIS — K449 Diaphragmatic hernia without obstruction or gangrene: Secondary | ICD-10-CM | POA: Diagnosis present

## 2018-05-04 LAB — BASIC METABOLIC PANEL
Anion gap: 7 (ref 5–15)
BUN: 8 mg/dL (ref 6–20)
CO2: 21 mmol/L — ABNORMAL LOW (ref 22–32)
Calcium: 7.9 mg/dL — ABNORMAL LOW (ref 8.9–10.3)
Chloride: 109 mmol/L (ref 98–111)
Creatinine, Ser: 1.07 mg/dL — ABNORMAL HIGH (ref 0.44–1.00)
GFR calc Af Amer: >60 mL/min (ref >60)
GFR calc non Af Amer: 57 mL/min — ABNORMAL LOW (ref >60)
GLUCOSE: 105 mg/dL — AB (ref 70–99)
Potassium: 3.7 mmol/L (ref 3.5–5.1)
Sodium: 137 mmol/L (ref 135–145)

## 2018-05-04 LAB — MAGNESIUM: Magnesium: 2 mg/dL (ref 1.7–2.4)

## 2018-05-04 MED ORDER — SODIUM CHLORIDE 0.9 % IV SOLN
INTRAVENOUS | Status: DC
  Administered 2018-05-04 – 2018-05-05 (×3): via INTRAVENOUS

## 2018-05-04 MED ORDER — LOPERAMIDE HCL 2 MG PO CAPS
4.0000 mg | ORAL_CAPSULE | Freq: Once | ORAL | Status: AC
  Administered 2018-05-04: 4 mg via ORAL
  Filled 2018-05-04: qty 2

## 2018-05-04 NOTE — Progress Notes (Signed)
PROGRESS NOTE    BRIANNA BENNETT  RXY:[id number] DOB: 1960-10-14 DOA: 05/02/2018 PCP: Rochel Brome, MD    Brief Narrative:  [name] is a 58 y.o. female with Past medical history of Crohn's disease, cyclical vomiting syndrome, gastroparesis, GERD, HLD, B12 deficiency. Patient comes to the hospital with complaints of nausea vomiting and diarrhea.  She was seen in the [facility] ER on 04/27/2018 where she was able to tolerate p.o. diet and was discharged home.  Comes to the hospital again as she continues to have vomiting and unable to take anything down . CT abdomen was performed, no acute abnormalities were seen.  Patient was seen to have hypokalemia and metabolic acidosis and therefore was referred for admission.  Assessment & Plan:   Principal Problem:   Hypokalemia Active Problems:   GERD (gastroesophageal reflux disease)   Hemorrhoids   Pancreatic insufficiency   CC (Crohn's colitis) (HCC)   Adrenal insufficiency (Addison's disease) (HCC)   Chronic back pain   Seizure ([place])   Gastroenteritis suspect viral vs crohn's flare up.  - pt reports having 15 bowel movements since last night .  - vomiting resolved, nausea present.  - pt reports clear liquid diet is making her nausea more and she would like to try soft diet and see if she can tolerate. She seems to be tolerating soft diet.  - continue with fluid resuscitation as she continues to have watery diarrhea. She remains afebrile, non toxic, but reports very weak and dizzy with on going bowel movements.  - symptomatic management with IV anti emetics and pain control.    H/o chronic pain syndrome:  - resume home dose of gabapentin and oxycodone.    H/o Adrenal insufficiency:  - resume home cortef.    H/o pancreatic insufficiency:  - continue with creon.    Hypokalemia and hypomagnesemia:  Replaced by IV and oral route.  Repeat wnl.   Mild AKI: WORSENING of creatinine from 0.79 to 1.07 with  ongoing bowel movements. Continue with IV fluids.   Seizure:  None this admission, resume keppra.    GERD: On oral protonix.    DVT prophylaxis: heparin.  Code Status: full code.  Family Communication: none at bedside.  Disposition Plan:  Pending improvement in the diarrhea.  Consultants:   None.   Procedures:NOne.  Antimicrobials: (None.   Subjective: Tired, exhausted, dizzy. 15 bowel movements so far since last night.   Objective: Vitals:   05/03/18 0539 05/03/18 1248 05/03/18 2045 05/04/18 0508  BP: (!) 147/90 (!) 162/86 102/60 117/67  Pulse: 60 (!) 58 (!) 55 (!) 54  Resp: 16 18 16 16   Temp: 99.3 F (37.4 C) 99.9 F (37.7 C) 98.4 F (36.9 C) 99.9 F (37.7 C)  TempSrc: Oral Oral Oral Oral  SpO2: 98% 95% 93% 94%  Weight: 82.9 kg   84.4 kg  Height:       No intake or output data in the 24 hours ending 05/04/18 1217 Filed Weights   05/02/18 1859 05/03/18 0539 05/04/18 0508  Weight: 84 kg 82.9 kg 84.4 kg    Examination:  General exam: appears fatigued, not in any distress.  Respiratory system: Clear to auscultation bilaterally, no wheezing or rhonchi.  Cardiovascular system: S1 & S2 heard, RRR. No JVD,  No pedal edema. Gastrointestinal system: Abdomen is soft, mild tenderness generalized.  Bowel sounds good.  Central nervous system: Alert and oriented. Grossly non focal. Extremities: Symmetric 5 x 5 power. Skin: No rashes, lesions or  ulcers Psychiatry: Mood & affect appropriate.     Data Reviewed: I have personally reviewed following labs and imaging studies  CBC: Recent Labs  Lab 05/02/18 1037 05/03/18 0350  WBC 5.0 4.0  HGB 14.6 12.6  HCT 45.7 40.9  MCV 92.1 94.7  PLT 175 882*   Basic Metabolic Panel: Recent Labs  Lab 05/02/18 1037 05/02/18 1906 05/03/18 0350 05/04/18 0933  NA 138 140 139 137  K 2.5* 2.9* 3.4* 3.7  CL 100 108 107 109  CO2 24 24 24  21*  GLUCOSE 115* 96 100* 105*  BUN 7 6 5* 8  CREATININE 0.86 0.83 0.79 1.07*    CALCIUM 8.6* 8.0* 8.1* 7.9*  MG  --  1.5* 1.5* 2.0   GFR: Estimated Creatinine Clearance: 64 mL/min (A) (by C-G formula based on SCr of 1.07 mg/dL (H)). Liver Function Tests: Recent Labs  Lab 05/02/18 1037 05/02/18 1906 05/03/18 0350  AST 31 30 36  ALT 20 20 21   ALKPHOS 103 85 83  BILITOT 0.6 0.7 0.7  PROT 8.7* 7.2 6.8  ALBUMIN 4.3 3.8 3.5   Recent Labs  Lab 05/02/18 1037  LIPASE 37   No results for input(s): AMMONIA in the last 168 hours. Coagulation Profile: No results for input(s): INR, PROTIME in the last 168 hours. Cardiac Enzymes: No results for input(s): CKTOTAL, CKMB, CKMBINDEX, TROPONINI in the last 168 hours. BNP (last 3 results) No results for input(s): PROBNP in the last 8760 hours. HbA1C: No results for input(s): HGBA1C in the last 72 hours. CBG: No results for input(s): GLUCAP in the last 168 hours. Lipid Profile: No results for input(s): CHOL, HDL, LDLCALC, TRIG, CHOLHDL, LDLDIRECT in the last 72 hours. Thyroid Function Tests: Recent Labs    05/02/18 1900  TSH 0.307*  FREET4 0.94   Anemia Panel: No results for input(s): VITAMINB12, FOLATE, FERRITIN, TIBC, IRON, RETICCTPCT in the last 72 hours. Sepsis Labs: No results for input(s): PROCALCITON, LATICACIDVEN in the last 168 hours.  No results found for this or any previous visit (from the past 240 hour(s)).       Radiology Studies: Ct Abdomen Pelvis W Contrast  Result Date: 05/02/2018 CLINICAL DATA:  Abdominal pain, diarrhea, back pain EXAM: CT ABDOMEN AND PELVIS WITH CONTRAST TECHNIQUE: Multidetector CT imaging of the abdomen and pelvis was performed using the standard protocol following bolus administration of intravenous contrast. CONTRAST:  175m ISOVUE-300 IOPAMIDOL (ISOVUE-300) INJECTION 61% COMPARISON:  05/18/2017 FINDINGS: Lower chest: No acute abnormality. Hepatobiliary: No focal liver abnormality is seen. Hepatic steatosis. Status post cholecystectomy. No biliary dilatation. Pancreas:  Unremarkable. No pancreatic ductal dilatation or surrounding inflammatory changes. Spleen: Normal in size without focal abnormality. Adrenals/Urinary Tract: Adrenal glands are unremarkable. Kidneys are normal, without renal calculi, focal lesion, or hydronephrosis. Bladder is unremarkable. Stomach/Bowel: Stomach is within normal limits. Evidence of prior partial right colon resection with fatty mural stratification of the underdistended transverse colon and to a lesser extent, the rectum. Vascular/Lymphatic: Calcific atherosclerosis. No enlarged abdominal or pelvic lymph nodes. Reproductive: No mass or other abnormality. Status post hysterectomy. Other: No abdominal wall hernia or abnormality. No abdominopelvic ascites. Musculoskeletal: No acute or significant osseous findings. IMPRESSION: 1. No acute CT findings of the abdomen and pelvis to explain pain or diarrhea. 2. Evidence of prior partial right colon resection with fatty mural stratification of the underdistended transverse colon and to a lesser extent, the rectum. Findings are generally consistent post resection findings of Crohn's disease without evidence of acute inflammation. 3.  Other  chronic and incidental findings as detailed above. Electronically Signed   By: Eddie Candle M.D.   On: 05/02/2018 15:10        Scheduled Meds: . clobetasol cream  1 application Topical BID  . colestipol  1 g Oral TID  . dronabinol  2.5 mg Oral BID AC  . gabapentin  400 mg Oral Q lunch   And  . gabapentin  800 mg Oral Q supper   And  . gabapentin  1,200 mg Oral QHS  . heparin  5,000 Units Subcutaneous Q8H  . hydrocortisone  1 application Rectal BID  . hydrocortisone  5 mg Oral BID  . hyoscyamine  0.125 mg Sublingual QID  . levETIRAcetam  250 mg Oral BID  . lipase/protease/amylase  72,000 Units Oral TID WC  . pantoprazole  40 mg Oral Daily  . potassium chloride  20 mEq Oral Daily  . prochlorperazine  10 mg Oral BID  . propranolol ER  80 mg Oral Daily    . sodium chloride flush  3 mL Intravenous Once  . traZODone  50 mg Oral QHS   Continuous Infusions: . sodium chloride       LOS: 0 days        Hosie Poisson, MD [facility] Pager [phone]  If 7PM-7AM, please contact night-coverage [email or web address] Password [facility] 05/04/2018, 12:17 PM

## 2018-05-05 MED ORDER — PROMETHAZINE HCL 12.5 MG PO TABS: 12.5000 mg | ORAL_TABLET | Freq: Four times a day (QID) | ORAL | 0 refills | Status: DC | PRN

## 2018-05-06 NOTE — Discharge Summary (Signed)
Physician Discharge Summary  [name] PFX:[id number] DOB: [birth year] DOA: 05/02/2018  PCP: Rochel Brome, MD  Admit date: 05/02/2018 Discharge date:2 /29/2020  Admitted From: Home.  Disposition:  Home.   Recommendations for Outpatient Follow-up:  1. Follow up with PCP in 1-2 weeks 2. Please obtain BMP/CBC in one week Please follow up with your gastroenterology in 1 to 2  weeks.   Discharge Condition:stable.  CODE STATUS:full code.  Diet recommendation: Heart Healthy  Brief/Interim Summary: Jessica Sanjose Montgomeryis a 57 y.o.femalewith Past medical history ofCrohn's disease, cyclical vomiting syndrome, gastroparesis, GERD, HLD, B12 deficiency. Patient comes to the hospital with complaints of nausea vomiting and diarrhea. She was seen in the [facility] ER on 04/27/2018 where she was able to tolerate p.o. diet and was discharged home. Comes to the hospital again as she continues to have vomiting and unable to take anything down . CT abdomen was performed, no acute abnormalities were seen. Patient was seen to have hypokalemia and metabolic acidosis and therefore was referred for admission. Discharge Diagnoses:  Principal Problem:   Hypokalemia Active Problems:   GERD (gastroesophageal reflux disease)   Hemorrhoids   Pancreatic insufficiency   CC (Crohn's colitis) (HCC)   Adrenal insufficiency (Addison's disease) (HCC)   Chronic back pain   Seizure ([place])  Gastroenteritis suspect viral vs crohn's flare up.   - vomiting resolved, nausea improved and diarrhea also has improved.  Able to tolerate soft diet .  Recommend outpatient follow up with gastroenterology if symptoms re occur.    H/o chronic pain syndrome:  - resume home dose of gabapentin and oxycodone.    H/o Adrenal insufficiency:  - resume home cortef.    H/o pancreatic insufficiency:  - continue with creon.    Hypokalemia and hypomagnesemia:  Replaced by IV and oral route.  Repeat wnl.    Mild AKI: Possibly from diarrhea, hydrated, recommended to get BMP on Monday to check electrolytes and creatinine.   Seizure:  None this admission, resume keppra.    GERD: On oral protonix.  Discharge Instructions  Discharge Instructions    Diet - low sodium heart healthy   Complete by:  As directed    Discharge instructions   Complete by:  As directed    Please follow up with PCP in one week.     Allergies as of 05/05/2018      Reactions   Codeine Hives, Nausea And Vomiting   Amitriptyline Itching, Hives   Humira [adalimumab] Rash      Medication List    TAKE these medications   clobetasol cream 0.05 % Commonly known as:  TEMOVATE Apply 1 application topically 2 (two) times daily.   colestipol 1 g tablet Commonly known as:  COLESTID Take 1 tablet (1 g total) by mouth 3 (three) times daily.   cyanocobalamin 1000 MCG/ML injection Commonly known as:  (VITAMIN B-12) Inject 32m SQ once every 7 days x 4 then inject 1 ml SQ every 28 days.   dronabinol 2.5 MG capsule Commonly known as:  MARINOL Take 2.5 mg by mouth 2 (two) times daily.   econazole nitrate 1 % cream APPLY  CREAM TOPICALLY ONCE DAILY What changed:  See the new instructions.   gabapentin 400 MG capsule Commonly known as:  NEURONTIN Take 1 capsule by mouth at lunch, 2 at dinner and 3 at bedtime   hydrocortisone 2.5 % rectal cream Commonly known as:  ANUSOL-HC Place 1 application rectally 2 (two) times daily.   hydrocortisone 5  MG tablet Commonly known as:  CORTEF Take 5 mg by mouth 2 (two) times daily.   hyoscyamine 0.125 MG SL tablet Commonly known as:  LEVSIN SL Place 0.125 mg under the tongue 4 (four) times daily.   levETIRAcetam 250 MG tablet Commonly known as:  KEPPRA Take 250 mg by mouth 2 (two) times daily.   lipase/protease/amylase 36000 UNITS Cpep capsule Commonly known as:  CREON Take 2  ( 72,000units) by mouth three times a day with meals one (36,000units) with snacks This  is a dose increase for this -patient   Needles & Syringes Misc 1 Syringe by Does not apply route every 30 (thirty) days.   omeprazole 40 MG capsule Commonly known as:  PRILOSEC Take 1 capsule (40 mg total) by mouth daily.   Oxycodone HCl 10 MG Tabs Take 10 mg by mouth 5 (five) times daily as needed (pain). 1 tablet up to five times a day as needed for severe pain.   pramoxine-hydrocortisone 1-1 % rectal cream Commonly known as:  PROCTOCREAM-HC Place 1 application rectally 2 (two) times daily.   PROAIR HFA 108 (90 Base) MCG/ACT inhaler Generic drug:  albuterol Inhale 2 puffs into the lungs every 4 (four) hours as needed for wheezing or shortness of breath.   prochlorperazine 10 MG tablet Commonly known as:  COMPAZINE Take 10 mg by mouth 2 (two) times daily.   promethazine 12.5 MG tablet Commonly known as:  PHENERGAN Take 1 tablet (12.5 mg total) by mouth every 6 (six) hours as needed for nausea or vomiting.   propranolol ER 80 MG 24 hr capsule Commonly known as:  INDERAL LA Take 1 capsule by mouth daily.   RABEprazole 20 MG tablet Commonly known as:  ACIPHEX Take 20 mg by mouth daily.   tiZANidine 4 MG tablet Commonly known as:  ZANAFLEX Take 4 mg by mouth every 8 (eight) hours as needed for muscle spasms.   traZODone 50 MG tablet Commonly known as:  DESYREL Take 50 mg by mouth at bedtime.   vedolizumab 300 MG injection Commonly known as:  ENTYVIO Inject 300 mg into the vein every 8 (eight) weeks.   VITAMIN D PO Take 1 tablet by mouth daily.   zolmitriptan 5 MG nasal solution Commonly known as:  ZOMIG 1 spray in one nostril.  May repeat 1 spray once after 2 hours if needed. Not to exceed 2 sprays in 24 hours      Follow-up Information    Cox, Kirsten, MD. Schedule an appointment as soon as possible for a visit in 1 week(s).   Specialties:  Family Medicine, Interventional Cardiology, Radiology, Anesthesiology Contact information: [address] [place] [address] [phone]          Allergies  Allergen Reactions  . Codeine Hives and Nausea And Vomiting  . Amitriptyline Itching and Hives  . Humira [Adalimumab] Rash    Consultations:  None.    Procedures/Studies: Ct Abdomen Pelvis W Contrast  Result Date: 05/02/2018 CLINICAL DATA:  Abdominal pain, diarrhea, back pain EXAM: CT ABDOMEN AND PELVIS WITH CONTRAST TECHNIQUE: Multidetector CT imaging of the abdomen and pelvis was performed using the standard protocol following bolus administration of intravenous contrast. CONTRAST:  160m ISOVUE-300 IOPAMIDOL (ISOVUE-300) INJECTION 61% COMPARISON:  05/18/2017 FINDINGS: Lower chest: No acute abnormality. Hepatobiliary: No focal liver abnormality is seen. Hepatic steatosis. Status post cholecystectomy. No biliary dilatation. Pancreas: Unremarkable. No pancreatic ductal dilatation or surrounding inflammatory changes. Spleen: Normal in size without focal abnormality.  Adrenals/Urinary Tract: Adrenal glands are unremarkable. Kidneys are normal, without renal calculi, focal lesion, or hydronephrosis. Bladder is unremarkable. Stomach/Bowel: Stomach is within normal limits. Evidence of prior partial right colon resection with fatty mural stratification of the underdistended transverse colon and to a lesser extent, the rectum. Vascular/Lymphatic: Calcific atherosclerosis. No enlarged abdominal or pelvic lymph nodes. Reproductive: No mass or other abnormality. Status post hysterectomy. Other: No abdominal wall hernia or abnormality. No abdominopelvic ascites. Musculoskeletal: No acute or significant osseous findings. IMPRESSION: 1. No acute CT findings of the abdomen and pelvis to explain pain or diarrhea. 2. Evidence of prior partial right colon resection with fatty mural stratification of the underdistended transverse colon and to a lesser extent, the rectum. Findings are generally consistent post resection findings of Crohn's disease without  evidence of acute inflammation. 3.  Other chronic and incidental findings as detailed above. Electronically Signed   By: Eddie Candle M.D.   On: 05/02/2018 15:10   Dg Abd Acute W/chest  Result Date: 05/02/2018 CLINICAL DATA:  Nausea and vomiting for 3 weeks. EXAM: DG ABDOMEN ACUTE W/ 1V CHEST COMPARISON:  CT chest, abdomen and pelvis 05/18/2017. Chest and two views abdomen 05/15/2017. FINDINGS: There is no evidence of dilated bowel loops or free intraperitoneal air. No radiopaque calculi or other significant radiographic abnormality is seen. Heart size and mediastinal contours are within normal limits. Both lungs are clear. Spinal stimulator and cholecystectomy clips noted. Suture for surgical anastomosis in the right abdomen also seen. IMPRESSION: Negative exam. Electronically Signed   By: Inge Rise M.D.   On: 05/02/2018 12:40       Subjective: Diarrhea improved , no nausea or vomiting and would like to go home.   Discharge Exam: Vitals:   05/05/18 0524 05/05/18 0902  BP: 138/70   Pulse: (!) 53 (!) 56  Resp: 18   Temp: 98.6 F (37 C)   SpO2: 97%    Vitals:   05/04/18 1343 05/04/18 2053 05/05/18 0524 05/05/18 0902  BP: 130/74 118/71 138/70   Pulse: (!) 53 (!) 53 (!) 53 (!) 56  Resp: 14 18 18    Temp: 98.7 F (37.1 C) 98.6 F (37 C) 98.6 F (37 C)   TempSrc: Oral Oral Oral   SpO2: 97% 96% 97%   Weight:   84.6 kg   Height:        General: Pt is alert, awake, not in acute distress Cardiovascular: RRR, S1/S2 +, no rubs, no gallops Respiratory: CTA bilaterally, no wheezing, no rhonchi Abdominal: Soft, NT, ND, bowel sounds + Extremities: no edema, no cyanosis    The results of significant diagnostics from this hospitalization (including imaging, microbiology, ancillary and laboratory) are listed below for reference.     Microbiology: No results found for this or any previous visit (from the past 240 hour(s)).   Labs: BNP (last 3 results) No results for input(s):  BNP in the last 8760 hours. Basic Metabolic Panel: Recent Labs  Lab 05/02/18 1037 05/02/18 1906 05/03/18 0350 05/04/18 0933  NA 138 140 139 137  K 2.5* 2.9* 3.4* 3.7  CL 100 108 107 109  CO2 24 24 24  21*  GLUCOSE 115* 96 100* 105*  BUN 7 6 5* 8  CREATININE 0.86 0.83 0.79 1.07*  CALCIUM 8.6* 8.0* 8.1* 7.9*  MG  --  1.5* 1.5* 2.0   Liver Function Tests: Recent Labs  Lab 05/02/18 1037 05/02/18 1906 05/03/18 0350  AST 31 30 36  ALT 20 20 21   ALKPHOS  103 85 83  BILITOT 0.6 0.7 0.7  PROT 8.7* 7.2 6.8  ALBUMIN 4.3 3.8 3.5   Recent Labs  Lab 05/02/18 1037  LIPASE 37   No results for input(s): AMMONIA in the last 168 hours. CBC: Recent Labs  Lab 05/02/18 1037 05/03/18 0350  WBC 5.0 4.0  HGB 14.6 12.6  HCT 45.7 40.9  MCV 92.1 94.7  PLT 175 140*   Cardiac Enzymes: No results for input(s): CKTOTAL, CKMB, CKMBINDEX, TROPONINI in the last 168 hours. BNP: Invalid input(s): POCBNP CBG: No results for input(s): GLUCAP in the last 168 hours. D-Dimer No results for input(s): DDIMER in the last 72 hours. Hgb A1c No results for input(s): HGBA1C in the last 72 hours. Lipid Profile No results for input(s): CHOL, HDL, LDLCALC, TRIG, CHOLHDL, LDLDIRECT in the last 72 hours. Thyroid function studies No results for input(s): TSH, T4TOTAL, T3FREE, THYROIDAB in the last 72 hours.  Invalid input(s): FREET3 Anemia work up No results for input(s): VITAMINB12, FOLATE, FERRITIN, TIBC, IRON, RETICCTPCT in the last 72 hours. Urinalysis    Component Value Date/Time   COLORURINE YELLOW 05/02/2018 1049   APPEARANCEUR CLEAR 05/02/2018 1049   LABSPEC 1.012 05/02/2018 1049   PHURINE 6.0 05/02/2018 1049   GLUCOSEU NEGATIVE 05/02/2018 1049   HGBUR SMALL (A) 05/02/2018 1049   BILIRUBINUR NEGATIVE 05/02/2018 1049   KETONESUR 5 (A) 05/02/2018 1049   PROTEINUR 30 (A) 05/02/2018 1049   UROBILINOGEN 0.2 03/29/2013 0919   NITRITE NEGATIVE 05/02/2018 1049   LEUKOCYTESUR NEGATIVE  05/02/2018 1049   Sepsis Labs Invalid input(s): PROCALCITONIN,  WBC,  LACTICIDVEN Microbiology No results found for this or any previous visit (from the past 240 hour(s)).   Time coordinating discharge: 35  minutes  SIGNED:   Hosie Poisson, MD  [facility] 05/06/2018, 9:31 AM Pager   If 7PM-7AM, please contact night-coverage [email or web address] Password [email or web address]

## 2018-05-14 ENCOUNTER — Other Ambulatory Visit: Payer: Self-pay | Admitting: Gastroenterology

## 2018-05-14 DIAGNOSIS — M47817 Spondylosis without myelopathy or radiculopathy, lumbosacral region: Secondary | ICD-10-CM | POA: Diagnosis not present

## 2018-05-14 DIAGNOSIS — Z79899 Other long term (current) drug therapy: Secondary | ICD-10-CM | POA: Diagnosis not present

## 2018-05-14 DIAGNOSIS — G894 Chronic pain syndrome: Secondary | ICD-10-CM | POA: Diagnosis not present

## 2018-05-14 DIAGNOSIS — M5137 Other intervertebral disc degeneration, lumbosacral region: Secondary | ICD-10-CM | POA: Diagnosis not present

## 2018-05-14 DIAGNOSIS — Z79891 Long term (current) use of opiate analgesic: Secondary | ICD-10-CM | POA: Diagnosis not present

## 2018-05-16 DIAGNOSIS — R1030 Lower abdominal pain, unspecified: Secondary | ICD-10-CM | POA: Diagnosis not present

## 2018-05-31 DIAGNOSIS — E0590 Thyrotoxicosis, unspecified without thyrotoxic crisis or storm: Secondary | ICD-10-CM | POA: Diagnosis not present

## 2018-06-05 ENCOUNTER — Other Ambulatory Visit: Payer: Self-pay | Admitting: Gastroenterology

## 2018-06-10 ENCOUNTER — Inpatient Hospital Stay (HOSPITAL_COMMUNITY)
Admission: EM | Admit: 2018-06-10 | Discharge: 2018-06-13 | DRG: 641 | Disposition: A | Discharge status: Home / Self Care | Payer: BLUE CROSS/BLUE SHIELD | Attending: Internal Medicine | Admitting: Internal Medicine

## 2018-06-10 ENCOUNTER — Encounter (HOSPITAL_COMMUNITY): Payer: Self-pay | Admitting: *Deleted

## 2018-06-10 ENCOUNTER — Emergency Department (HOSPITAL_COMMUNITY): Payer: BLUE CROSS/BLUE SHIELD

## 2018-06-10 ENCOUNTER — Other Ambulatory Visit: Payer: Self-pay

## 2018-06-10 DIAGNOSIS — E271 Primary adrenocortical insufficiency: Secondary | ICD-10-CM | POA: Diagnosis present

## 2018-06-10 DIAGNOSIS — E0590 Thyrotoxicosis, unspecified without thyrotoxic crisis or storm: Secondary | ICD-10-CM | POA: Diagnosis present

## 2018-06-10 DIAGNOSIS — K50919 Crohn's disease, unspecified, with unspecified complications: Secondary | ICD-10-CM | POA: Diagnosis not present

## 2018-06-10 DIAGNOSIS — Z87891 Personal history of nicotine dependence: Secondary | ICD-10-CM

## 2018-06-10 DIAGNOSIS — E876 Hypokalemia: Secondary | ICD-10-CM | POA: Diagnosis not present

## 2018-06-10 DIAGNOSIS — Z800 Family history of malignant neoplasm of digestive organs: Secondary | ICD-10-CM

## 2018-06-10 DIAGNOSIS — Z79899 Other long term (current) drug therapy: Secondary | ICD-10-CM

## 2018-06-10 DIAGNOSIS — G40909 Epilepsy, unspecified, not intractable, without status epilepticus: Secondary | ICD-10-CM | POA: Diagnosis present

## 2018-06-10 DIAGNOSIS — Z888 Allergy status to other drugs, medicaments and biological substances status: Secondary | ICD-10-CM

## 2018-06-10 DIAGNOSIS — Z801 Family history of malignant neoplasm of trachea, bronchus and lung: Secondary | ICD-10-CM

## 2018-06-10 DIAGNOSIS — E538 Deficiency of other specified B group vitamins: Secondary | ICD-10-CM | POA: Diagnosis present

## 2018-06-10 DIAGNOSIS — K3184 Gastroparesis: Secondary | ICD-10-CM | POA: Diagnosis present

## 2018-06-10 DIAGNOSIS — E860 Dehydration: Principal | ICD-10-CM | POA: Diagnosis present

## 2018-06-10 DIAGNOSIS — R112 Nausea with vomiting, unspecified: Secondary | ICD-10-CM | POA: Diagnosis not present

## 2018-06-10 DIAGNOSIS — K5000 Crohn's disease of small intestine without complications: Secondary | ICD-10-CM | POA: Diagnosis not present

## 2018-06-10 DIAGNOSIS — Z90710 Acquired absence of both cervix and uterus: Secondary | ICD-10-CM

## 2018-06-10 DIAGNOSIS — K8689 Other specified diseases of pancreas: Secondary | ICD-10-CM | POA: Diagnosis present

## 2018-06-10 DIAGNOSIS — R1110 Vomiting, unspecified: Secondary | ICD-10-CM | POA: Diagnosis not present

## 2018-06-10 DIAGNOSIS — Z885 Allergy status to narcotic agent status: Secondary | ICD-10-CM

## 2018-06-10 DIAGNOSIS — Z9049 Acquired absence of other specified parts of digestive tract: Secondary | ICD-10-CM | POA: Diagnosis not present

## 2018-06-10 DIAGNOSIS — Z8719 Personal history of other diseases of the digestive system: Secondary | ICD-10-CM | POA: Diagnosis not present

## 2018-06-10 DIAGNOSIS — Z803 Family history of malignant neoplasm of breast: Secondary | ICD-10-CM

## 2018-06-10 DIAGNOSIS — Z8371 Family history of colonic polyps: Secondary | ICD-10-CM

## 2018-06-10 DIAGNOSIS — K529 Noninfective gastroenteritis and colitis, unspecified: Secondary | ICD-10-CM | POA: Diagnosis present

## 2018-06-10 DIAGNOSIS — K760 Fatty (change of) liver, not elsewhere classified: Secondary | ICD-10-CM | POA: Diagnosis not present

## 2018-06-10 DIAGNOSIS — Z79891 Long term (current) use of opiate analgesic: Secondary | ICD-10-CM | POA: Diagnosis not present

## 2018-06-10 DIAGNOSIS — Z86010 Personal history of colonic polyps: Secondary | ICD-10-CM | POA: Diagnosis not present

## 2018-06-10 DIAGNOSIS — E869 Volume depletion, unspecified: Secondary | ICD-10-CM | POA: Diagnosis not present

## 2018-06-10 DIAGNOSIS — R197 Diarrhea, unspecified: Secondary | ICD-10-CM

## 2018-06-10 DIAGNOSIS — D899 Disorder involving the immune mechanism, unspecified: Secondary | ICD-10-CM | POA: Diagnosis not present

## 2018-06-10 LAB — COMPREHENSIVE METABOLIC PANEL
ALT: 22 U/L (ref 0–44)
AST: 22 U/L (ref 15–41)
Albumin: 4.3 g/dL (ref 3.5–5.0)
Alkaline Phosphatase: 117 U/L (ref 38–126)
Anion gap: 13 (ref 5–15)
BUN: 13 mg/dL (ref 6–20)
CO2: 23 mmol/L (ref 22–32)
Calcium: 9.1 mg/dL (ref 8.9–10.3)
Chloride: 101 mmol/L (ref 98–111)
Creatinine, Ser: 0.93 mg/dL (ref 0.44–1.00)
GFR calc Af Amer: >60 mL/min (ref >60)
GFR calc non Af Amer: >60 mL/min (ref >60)
Glucose, Bld: 124 mg/dL — ABNORMAL HIGH (ref 70–99)
Potassium: 2.4 mmol/L — CL (ref 3.5–5.1)
Sodium: 137 mmol/L (ref 135–145)
Total Bilirubin: 0.8 mg/dL (ref 0.3–1.2)
Total Protein: 8.6 g/dL — ABNORMAL HIGH (ref 6.5–8.1)

## 2018-06-10 LAB — CBC WITH DIFFERENTIAL/PLATELET
Abs Immature Granulocytes: 0.06 10*3/uL (ref 0.00–0.07)
Basophils Absolute: 0.1 10*3/uL (ref 0.0–0.1)
Basophils Relative: 1 %
Eosinophils Absolute: 0.1 10*3/uL (ref 0.0–0.5)
Eosinophils Relative: 1 %
HCT: 45.3 % (ref 36.0–46.0)
Hemoglobin: 15.1 g/dL — ABNORMAL HIGH (ref 12.0–15.0)
Immature Granulocytes: 1 %
Lymphocytes Relative: 28 %
Lymphs Abs: 2.8 10*3/uL (ref 0.7–4.0)
MCH: 30 pg (ref 26.0–34.0)
MCHC: 33.3 g/dL (ref 30.0–36.0)
MCV: 90.1 fL (ref 80.0–100.0)
Monocytes Absolute: 0.6 10*3/uL (ref 0.1–1.0)
Monocytes Relative: 6 %
Neutro Abs: 6.5 10*3/uL (ref 1.7–7.7)
Neutrophils Relative %: 63 %
Platelets: 274 10*3/uL (ref 150–400)
RBC: 5.03 MIL/uL (ref 3.87–5.11)
RDW: 12.9 % (ref 11.5–15.5)
WBC: 10.1 10*3/uL (ref 4.0–10.5)
nRBC: 0 % (ref 0.0–0.2)

## 2018-06-10 LAB — URINALYSIS, ROUTINE W REFLEX MICROSCOPIC
Bilirubin Urine: NEGATIVE
Glucose, UA: NEGATIVE mg/dL
Hgb urine dipstick: NEGATIVE
Ketones, ur: 5 mg/dL — AB
Leukocytes,Ua: NEGATIVE
Nitrite: NEGATIVE
Protein, ur: NEGATIVE mg/dL
Specific Gravity, Urine: 1.044 — ABNORMAL HIGH (ref 1.005–1.030)
pH: 6 (ref 5.0–8.0)

## 2018-06-10 LAB — MAGNESIUM: Magnesium: 1.4 mg/dL — ABNORMAL LOW (ref 1.7–2.4)

## 2018-06-10 LAB — LIPASE, BLOOD: Lipase: 37 U/L (ref 11–51)

## 2018-06-10 MED ORDER — LACTATED RINGERS IV SOLN: INTRAVENOUS | Status: DC

## 2018-06-10 MED ORDER — CYANOCOBALAMIN 1000 MCG/ML IJ SOLN
1000.0000 ug | INTRAMUSCULAR | Status: DC
  Administered 2018-06-11: 1000 ug via INTRAMUSCULAR
  Filled 2018-06-10: qty 1

## 2018-06-10 MED ORDER — SENNOSIDES-DOCUSATE SODIUM 8.6-50 MG PO TABS
2.0000 | ORAL_TABLET | Freq: Two times a day (BID) | ORAL | Status: DC
  Administered 2018-06-10: 2 via ORAL
  Filled 2018-06-10 (×3): qty 2

## 2018-06-10 MED ORDER — MAGNESIUM SULFATE 4 GM/100ML IV SOLN
4.0000 g | Freq: Once | INTRAVENOUS | Status: AC
  Administered 2018-06-10: 11:00:00 4 g via INTRAVENOUS
  Filled 2018-06-10: qty 100

## 2018-06-10 MED ORDER — PROCHLORPERAZINE EDISYLATE 10 MG/2ML IJ SOLN
10.0000 mg | Freq: Once | INTRAMUSCULAR | Status: AC
  Administered 2018-06-10: 08:00:00 10 mg via INTRAVENOUS
  Filled 2018-06-10: qty 2

## 2018-06-10 MED ORDER — PROPRANOLOL HCL ER 80 MG PO CP24
80.0000 mg | ORAL_CAPSULE | Freq: Every day | ORAL | Status: DC
  Administered 2018-06-11 – 2018-06-13 (×3): 80 mg via ORAL
  Filled 2018-06-10 (×4): qty 1

## 2018-06-10 MED ORDER — HYDROCORTISONE ACE-PRAMOXINE 1-1 % RE FOAM
1.0000 | Freq: Two times a day (BID) | RECTAL | Status: DC
  Filled 2018-06-10: qty 10

## 2018-06-10 MED ORDER — VEDOLIZUMAB 300 MG IV SOLR: 300.0000 mg | INTRAVENOUS | Status: DC

## 2018-06-10 MED ORDER — GABAPENTIN 400 MG PO CAPS: 400.0000 mg | ORAL_CAPSULE | Freq: Three times a day (TID) | ORAL | Status: DC

## 2018-06-10 MED ORDER — PROMETHAZINE HCL 25 MG/ML IJ SOLN
12.5000 mg | Freq: Four times a day (QID) | INTRAMUSCULAR | Status: DC | PRN
  Administered 2018-06-10: 20:00:00 12.5 mg via INTRAVENOUS
  Administered 2018-06-11 – 2018-06-12 (×5): 25 mg via INTRAVENOUS
  Filled 2018-06-10 (×6): qty 1

## 2018-06-10 MED ORDER — ENOXAPARIN SODIUM 40 MG/0.4ML ~~LOC~~ SOLN
40.0000 mg | SUBCUTANEOUS | Status: DC
  Administered 2018-06-10 – 2018-06-12 (×3): 40 mg via SUBCUTANEOUS
  Filled 2018-06-10 (×3): qty 0.4

## 2018-06-10 MED ORDER — GABAPENTIN 400 MG PO CAPS
800.0000 mg | ORAL_CAPSULE | Freq: Every day | ORAL | Status: DC
  Administered 2018-06-11 – 2018-06-13 (×3): 800 mg via ORAL
  Filled 2018-06-10 (×4): qty 2

## 2018-06-10 MED ORDER — KCL-LACTATED RINGERS 20 MEQ/L IV SOLN
INTRAVENOUS | Status: DC
  Filled 2018-06-10: qty 1000

## 2018-06-10 MED ORDER — SODIUM CHLORIDE 0.9 % IV BOLUS
1000.0000 mL | Freq: Once | INTRAVENOUS | Status: AC
  Administered 2018-06-10: 1000 mL via INTRAVENOUS

## 2018-06-10 MED ORDER — ONDANSETRON HCL 4 MG/2ML IJ SOLN
4.0000 mg | Freq: Once | INTRAMUSCULAR | Status: AC
  Administered 2018-06-10: 07:00:00 4 mg via INTRAVENOUS
  Filled 2018-06-10: qty 2

## 2018-06-10 MED ORDER — PANCRELIPASE (LIP-PROT-AMYL) 36000-114000 UNITS PO CPEP
36000.0000 [IU] | ORAL_CAPSULE | Freq: Three times a day (TID) | ORAL | Status: DC
  Administered 2018-06-11 – 2018-06-13 (×7): 36000 [IU] via ORAL
  Filled 2018-06-10 (×11): qty 1

## 2018-06-10 MED ORDER — LEVETIRACETAM 250 MG PO TABS
250.0000 mg | ORAL_TABLET | Freq: Two times a day (BID) | ORAL | Status: DC
  Administered 2018-06-10 – 2018-06-13 (×6): 250 mg via ORAL
  Filled 2018-06-10 (×7): qty 1

## 2018-06-10 MED ORDER — GABAPENTIN 400 MG PO CAPS
400.0000 mg | ORAL_CAPSULE | Freq: Every day | ORAL | Status: DC
  Administered 2018-06-11 – 2018-06-13 (×3): 400 mg via ORAL
  Filled 2018-06-10 (×3): qty 1

## 2018-06-10 MED ORDER — ZOLMITRIPTAN 5 MG NA SOLN: 1.0000 | Freq: Every day | NASAL | Status: DC | PRN

## 2018-06-10 MED ORDER — LACTATED RINGERS IV BOLUS
1000.0000 mL | Freq: Once | INTRAVENOUS | Status: AC
  Administered 2018-06-10: 09:00:00 1000 mL via INTRAVENOUS

## 2018-06-10 MED ORDER — ACETAMINOPHEN 325 MG PO TABS: 650.0000 mg | ORAL_TABLET | Freq: Four times a day (QID) | ORAL | Status: DC | PRN

## 2018-06-10 MED ORDER — PROMETHAZINE HCL 25 MG PO TABS
25.0000 mg | ORAL_TABLET | Freq: Four times a day (QID) | ORAL | Status: DC | PRN
  Administered 2018-06-10: 13:00:00 25 mg via ORAL
  Filled 2018-06-10: qty 1

## 2018-06-10 MED ORDER — IOHEXOL 300 MG/ML  SOLN
100.0000 mL | Freq: Once | INTRAMUSCULAR | Status: AC | PRN
  Administered 2018-06-10: 100 mL via INTRAVENOUS

## 2018-06-10 MED ORDER — POTASSIUM CHLORIDE 2 MEQ/ML IV SOLN
INTRAVENOUS | Status: DC
  Administered 2018-06-11 – 2018-06-12 (×4): via INTRAVENOUS
  Filled 2018-06-10 (×9): qty 1000

## 2018-06-10 MED ORDER — MAGNESIUM SULFATE 2 GM/50ML IV SOLN
2.0000 g | Freq: Once | INTRAVENOUS | Status: AC
  Administered 2018-06-10: 2 g via INTRAVENOUS
  Filled 2018-06-10: qty 50

## 2018-06-10 MED ORDER — SODIUM CHLORIDE (PF) 0.9 % IJ SOLN
INTRAMUSCULAR | Status: AC
  Filled 2018-06-10: qty 50

## 2018-06-10 MED ORDER — KCL-LACTATED RINGERS 20 MEQ/L IV SOLN: INTRAVENOUS | Status: DC

## 2018-06-10 MED ORDER — POTASSIUM CHLORIDE 10 MEQ/100ML IV SOLN
10.0000 meq | INTRAVENOUS | Status: AC
  Administered 2018-06-10 (×2): 10 meq via INTRAVENOUS
  Filled 2018-06-10 (×2): qty 100

## 2018-06-10 MED ORDER — SUMATRIPTAN 20 MG/ACT NA SOLN
20.0000 mg | NASAL | Status: DC | PRN
  Filled 2018-06-10: qty 1

## 2018-06-10 MED ORDER — MORPHINE SULFATE (PF) 4 MG/ML IV SOLN
4.0000 mg | Freq: Once | INTRAVENOUS | Status: AC
  Administered 2018-06-10: 4 mg via INTRAVENOUS
  Filled 2018-06-10: qty 1

## 2018-06-10 MED ORDER — HYDROCORTISONE ACE-PRAMOXINE 1-1 % RE CREA
1.0000 "application " | TOPICAL_CREAM | Freq: Two times a day (BID) | RECTAL | Status: DC
  Filled 2018-06-10: qty 30

## 2018-06-10 MED ORDER — MORPHINE SULFATE (PF) 2 MG/ML IV SOLN
2.0000 mg | INTRAVENOUS | Status: DC | PRN
  Administered 2018-06-10 – 2018-06-13 (×9): 2 mg via INTRAVENOUS
  Filled 2018-06-10 (×9): qty 1

## 2018-06-10 MED ORDER — METHIMAZOLE 5 MG PO TABS
5.0000 mg | ORAL_TABLET | Freq: Every day | ORAL | Status: DC
  Administered 2018-06-11 – 2018-06-13 (×3): 5 mg via ORAL
  Filled 2018-06-10 (×4): qty 1

## 2018-06-10 MED ORDER — TRAZODONE HCL 50 MG PO TABS
50.0000 mg | ORAL_TABLET | Freq: Every evening | ORAL | Status: DC | PRN
  Administered 2018-06-11: 50 mg via ORAL
  Filled 2018-06-10: qty 1

## 2018-06-10 MED ORDER — PANTOPRAZOLE SODIUM 40 MG PO TBEC
40.0000 mg | DELAYED_RELEASE_TABLET | Freq: Every day | ORAL | Status: DC
  Administered 2018-06-11 – 2018-06-13 (×3): 40 mg via ORAL
  Filled 2018-06-10 (×3): qty 1

## 2018-06-10 MED ORDER — PANCRELIPASE (LIP-PROT-AMYL) 12000-38000 UNITS PO CPEP
36000.0000 [IU] | ORAL_CAPSULE | ORAL | Status: DC | PRN
  Administered 2018-06-10: 13:00:00 36000 [IU] via ORAL

## 2018-06-10 MED ORDER — HYDROCORTISONE 5 MG PO TABS
5.0000 mg | ORAL_TABLET | Freq: Two times a day (BID) | ORAL | Status: DC
  Administered 2018-06-10 – 2018-06-13 (×6): 5 mg via ORAL
  Filled 2018-06-10 (×6): qty 1

## 2018-06-10 MED ORDER — VITAMIN D 25 MCG (1000 UNIT) PO TABS
1000.0000 [IU] | ORAL_TABLET | Freq: Every day | ORAL | Status: DC
  Administered 2018-06-11 – 2018-06-13 (×3): 1000 [IU] via ORAL
  Filled 2018-06-10 (×4): qty 1

## 2018-06-10 MED ORDER — GABAPENTIN 400 MG PO CAPS
1200.0000 mg | ORAL_CAPSULE | Freq: Every day | ORAL | Status: DC
  Administered 2018-06-11 – 2018-06-12 (×2): 1200 mg via ORAL
  Filled 2018-06-10 (×2): qty 3

## 2018-06-10 MED ORDER — DIPHENHYDRAMINE HCL 50 MG/ML IJ SOLN
25.0000 mg | Freq: Once | INTRAMUSCULAR | Status: AC
  Administered 2018-06-10: 25 mg via INTRAVENOUS
  Filled 2018-06-10: qty 1

## 2018-06-10 NOTE — ED Provider Notes (Signed)
Care assumed from [place] at shift change, please see her note for full details, but in brief [name] is a 59 y.o. female with a history of Crohn's, pancreatic insufficiency, adrenal insufficiency, who presents reporting 10 days of persistent emesis and diarrhea.  She has had some associated intermittent abdominal pains which she describes as cramping on the left and sharp in the right lower quadrant.  Decreased urinary output, no fevers or constitutional symptoms.  Does not feel like her usual Crohn's flare.  Plan is for labs, fluids, antiemetics and CT abdomen pelvis, work-up is pending at time of shift change.  Plan: Follow up on labs, and CT, re-eval symptoms, often requires admission due to metabolic derrangements  Labs Reviewed  CBC WITH DIFFERENTIAL/PLATELET - Abnormal; Notable for the following components:      Result Value   Hemoglobin 15.1 (*)    All other components within normal limits  MAGNESIUM - Abnormal; Notable for the following components:   Magnesium 1.4 (*)    All other components within normal limits  COMPREHENSIVE METABOLIC PANEL - Abnormal; Notable for the following components:   Potassium 2.4 (*)    Glucose, Bld 124 (*)    Total Protein 8.6 (*)    All other components within normal limits  GASTROINTESTINAL PANEL BY PCR, STOOL (REPLACES STOOL CULTURE)  LIPASE, BLOOD  URINALYSIS, ROUTINE W REFLEX MICROSCOPIC   EKG  EKG Interpretation  Date/Time:  Sunday June 10 2018 06:58:25 EDT Ventricular Rate:  108 PR Interval:    QRS Duration: 104 QT Interval:  352 QTC Calculation: 472 R Axis:   37 Text Interpretation:  Sinus tachycardia RSR' in V1 or V2, right VCD or RVH No acute changes No significant change since last tracing Confirmed by Varney Biles [id number]) on 06/10/2018 7:35:58 AM       Radiology Ct Abdomen Pelvis W Contrast  Result Date: 06/10/2018 CLINICAL DATA:  Nausea and vomiting EXAM: CT ABDOMEN AND PELVIS WITH CONTRAST TECHNIQUE:  Multidetector CT imaging of the abdomen and pelvis was performed using the standard protocol following bolus administration of intravenous contrast. CONTRAST:  142m OMNIPAQUE IOHEXOL 300 MG/ML  SOLN COMPARISON:  05/02/2018 FINDINGS: Lower chest:  No contributory findings. Hepatobiliary: No focal liver abnormality. Hepatic steatosis. Cholecystectomy. No bile duct dilatation Pancreas: Unremarkable. Spleen: Unremarkable. Adrenals/Urinary Tract: Negative adrenals. No hydronephrosis or stone. Unremarkable bladder. Stomach/Bowel: Right hemicolectomy. Submucosal low-density diffusely in the colon primarily attributed to fat deposition rather than colitis given the homogeneous appearance and lack of associated fat inflammation. Negative for bowel obstruction. Vascular/Lymphatic: No acute vascular abnormality. Atherosclerotic calcification. No mass or adenopathy. Reproductive:Hysterectomy with negative ovaries. Other: No ascites or pneumoperitoneum. Musculoskeletal: Dorsal column stimulator leads. IMPRESSION: 1. Negative for bowel obstruction or convincing inflammation. 2. Hepatic steatosis and atherosclerosis. Electronically Signed   By: JMonte FantasiaM.D.   On: 06/10/2018 09:09      Meds Medications  potassium chloride 10 mEq in 100 mL IVPB (10 mEq Intravenous New Bag/Given 06/10/18 0807)  magnesium sulfate IVPB 2 g 50 mL (has no administration in time range)  lactated ringers bolus 1,000 mL (has no administration in time range)  iohexol (OMNIPAQUE) 300 MG/ML solution 100 mL (has no administration in time range)  prochlorperazine (COMPAZINE) injection 10 mg (has no administration in time range)  diphenhydrAMINE (BENADRYL) injection 25 mg (has no administration in time range)  sodium chloride 0.9 % bolus 1,000 mL (1,000 mLs Intravenous Bolus 06/10/18 0724)  ondansetron (ZOFRAN) injection 4 mg (4 mg Intravenous  Given 06/10/18 0725)    MDM  Patient presenting with 10 days of persistent nausea vomiting and  diarrhea, tachycardic on arrival and appears very dehydrated.  Persistent nausea after Zofran, given Compazine and Benadryl which she reports have worked well for her in the past.  Labs significant for hypokalemia of 2.4 and hypomagnesemia, 2 rounds of IV potassium given as well as IV mag and 2 L IV fluids.  Patient does not have significant EKG changes with this hypokalemia.  She has no leukocytosis, hemoglobin is elevated suggesting hemoconcentration, normal lipase, normal renal function and LFTs, urinalysis without signs of infection.  GI pathogen panel collected and pending.  CT abdomen pelvis does not show any acute findings, chronic hepatic steatosis noted, no evidence of obstruction or colitis.   Given patient's electrolyte derangements, persistent nausea and diarrhea and her dehydration feel she would benefit from admission for further hydration, correction of electrolytes and treatment of nausea.  Patient has required admission for similar multiple times in the past.  Case discussed with Dr. [place] Crane with [facility] who will see and admit the patient.  Final diagnoses:  Hypokalemia  Hypomagnesemia  Vomiting and diarrhea      Janet Berlin 06/10/18 1007    Quintella Reichert, MD 06/10/18 1235

## 2018-06-10 NOTE — ED Triage Notes (Signed)
N/v/d for 10 days. Denies fevers.

## 2018-06-10 NOTE — ED Notes (Signed)
ED TO INPATIENT HANDOFF REPORT  Name/Age/Gender [name] 58 y.o. female  Code Status    Code Status Orders  (From admission, onward)         Start     Ordered   06/10/18 1017  Full code  Continuous     06/10/18 1016        Code Status History    Date Active Date Inactive Code Status Order ID Comments User Context   05/02/2018 1836 05/05/2018 1611 Full Code [id number]  Lavina Hamman, MD ED   05/16/2017 0026 05/20/2017 1842 Full Code [id number]  Rise Patience, MD Inpatient   03/01/2017 1632 03/04/2017 1645 Full Code [id number]  Phillips Grout, MD Inpatient   07/19/2016 0937 07/19/2016 2010 Full Code [id number]  Willia Craze, NP Inpatient   05/24/2016 2306 06/02/2016 1957 Full Code [id number]  Karmen Bongo, MD Inpatient   02/20/2016 2207 02/24/2016 1353 Full Code [id number]  Sid Falcon, MD Inpatient   05/28/2013 1119 06/01/2013 1629 Full Code [id number]  Alfredia Ferguson, PA-C Inpatient      Home/SNF/Other Home  Chief Complaint nausea;emesis  Level of Care/Admitting Diagnosis ED Disposition    ED Disposition Condition [facility] Area: [facility] [[id number]]  Level of Care: Med-Surg [16]  Diagnosis: Acute dehydration [8638177]  Admitting Physician: Kayleen Memos [[id number]]  Attending Physician: Kayleen Memos [[id number]]  PT Class (Do Not Modify): Observation [104]  PT Acc Code (Do Not Modify): Observation [10022]       Medical History Past Medical History:  Diagnosis Date  . Arthritis   . Chronic diarrhea   . Chronic disease anemia   . Crohn's disease ([place])   . Depression   . Family history of malignant neoplasm of gastrointestinal tract   . GERD (gastroesophageal reflux disease)   . H/O hiatal hernia   . History of adenomatous polyp of colon   . History of gastritis    AND HX ILEITIS  . History of kidney stones   . History of small bowel obstruction    SECONDARY CROHN'S STRICTURE  S/P COLECTOMY  .  Hyperlipidemia   . Hypertension   . Hypopotassemia   . Internal hemorrhoids   . Kidney stones   . Pancreatic insufficiency   . PONV (postoperative nausea and vomiting)   . Psoriasis    SKIN  . Right ureteral stone   . Rotavirus infection 05/31/2013  . S/P dilatation of esophageal stricture   . Seasonal asthma   . Urgency of urination   . Vitamin B12 deficiency     Allergies Allergies  Allergen Reactions  . Codeine Hives and Nausea And Vomiting  . Amitriptyline Itching and Hives  . Humira [Adalimumab] Rash    IV Location/Drains/Wounds Patient Lines/Drains/Airways Status   Active Line/Drains/Airways    Name:   Placement date:   Placement time:   Site:   Days:   Peripheral IV 06/10/18 Left Antecubital   06/10/18    0623    Antecubital   less than 1          Labs/Imaging Results for orders placed or performed during the hospital encounter of 06/10/18 (from the past 48 hour(s))  CBC with Differential     Status: Abnormal   Collection Time: 06/10/18  6:25 AM  Result Value Ref Range   WBC 10.1 4.0 - 10.5 K/uL   RBC 5.03 3.87 - 5.11 MIL/uL   Hemoglobin 15.1 (H) 12.0 - 15.0  g/dL   HCT 45.3 36.0 - 46.0 %   MCV 90.1 80.0 - 100.0 fL   MCH 30.0 26.0 - 34.0 pg   MCHC 33.3 30.0 - 36.0 g/dL   RDW 12.9 11.5 - 15.5 %   Platelets 274 150 - 400 K/uL   nRBC 0.0 0.0 - 0.2 %   Neutrophils Relative % 63 %   Neutro Abs 6.5 1.7 - 7.7 K/uL   Lymphocytes Relative 28 %   Lymphs Abs 2.8 0.7 - 4.0 K/uL   Monocytes Relative 6 %   Monocytes Absolute 0.6 0.1 - 1.0 K/uL   Eosinophils Relative 1 %   Eosinophils Absolute 0.1 0.0 - 0.5 K/uL   Basophils Relative 1 %   Basophils Absolute 0.1 0.0 - 0.1 K/uL   Immature Granulocytes 1 %   Abs Immature Granulocytes 0.06 0.00 - 0.07 K/uL    Comment: Performed at [facility], [place] [address] [place], [place] [address]  Lipase, blood     Status: None   Collection Time: 06/10/18  6:25 AM  Result Value Ref Range   Lipase 37 11 -  51 U/L    Comment: Performed at [facility], [place] [address] [place], [place] [address]  Magnesium     Status: Abnormal   Collection Time: 06/10/18  6:25 AM  Result Value Ref Range   Magnesium 1.4 (L) 1.7 - 2.4 mg/dL    Comment: Performed at [facility], [place] [address] [place], [place] [address]  Comprehensive metabolic panel     Status: Abnormal   Collection Time: 06/10/18  6:25 AM  Result Value Ref Range   Sodium 137 135 - 145 mmol/L   Potassium 2.4 (LL) 3.5 - 5.1 mmol/L    Comment: CRITICAL RESULT CALLED TO, READ BACK BY AND VERIFIED WITH: CLAPP,S RN [id number] COVNGTON,N    Chloride 101 98 - 111 mmol/L   CO2 23 22 - 32 mmol/L   Glucose, Bld 124 (H) 70 - 99 mg/dL   BUN 13 6 - 20 mg/dL   Creatinine, Ser 0.93 0.44 - 1.00 mg/dL   Calcium 9.1 8.9 - 10.3 mg/dL   Total Protein 8.6 (H) 6.5 - 8.1 g/dL   Albumin 4.3 3.5 - 5.0 g/dL   AST 22 15 - 41 U/L   ALT 22 0 - 44 U/L   Alkaline Phosphatase 117 38 - 126 U/L   Total Bilirubin 0.8 0.3 - 1.2 mg/dL   GFR calc non Af Amer >60 >60 mL/min   GFR calc Af Amer >60 >60 mL/min   Anion gap 13 5 - 15    Comment: Performed at [facility], [place] [address] [place], [place] [address]  Urinalysis, Routine w reflex microscopic     Status: Abnormal   Collection Time: 06/10/18  6:25 AM  Result Value Ref Range   Color, Urine YELLOW YELLOW   APPearance HAZY (A) CLEAR   Specific Gravity, Urine 1.044 (H) 1.005 - 1.030   pH 6.0 5.0 - 8.0   Glucose, UA NEGATIVE NEGATIVE mg/dL   Hgb urine dipstick NEGATIVE NEGATIVE   Bilirubin Urine NEGATIVE NEGATIVE   Ketones, ur 5 (A) NEGATIVE mg/dL   Protein, ur NEGATIVE NEGATIVE mg/dL   Nitrite NEGATIVE NEGATIVE   Leukocytes,Ua NEGATIVE NEGATIVE    Comment: Performed at [place] [address], [place], [place] [address]   Ct Abdomen Pelvis W Contrast  Result Date: 06/10/2018 CLINICAL DATA:  Nausea and  vomiting EXAM: CT ABDOMEN  AND PELVIS WITH CONTRAST TECHNIQUE: Multidetector CT imaging of the abdomen and pelvis was performed using the standard protocol following bolus administration of intravenous contrast. CONTRAST:  146m OMNIPAQUE IOHEXOL 300 MG/ML  SOLN COMPARISON:  05/02/2018 FINDINGS: Lower chest:  No contributory findings. Hepatobiliary: No focal liver abnormality. Hepatic steatosis. Cholecystectomy. No bile duct dilatation Pancreas: Unremarkable. Spleen: Unremarkable. Adrenals/Urinary Tract: Negative adrenals. No hydronephrosis or stone. Unremarkable bladder. Stomach/Bowel: Right hemicolectomy. Submucosal low-density diffusely in the colon primarily attributed to fat deposition rather than colitis given the homogeneous appearance and lack of associated fat inflammation. Negative for bowel obstruction. Vascular/Lymphatic: No acute vascular abnormality. Atherosclerotic calcification. No mass or adenopathy. Reproductive:Hysterectomy with negative ovaries. Other: No ascites or pneumoperitoneum. Musculoskeletal: Dorsal column stimulator leads. IMPRESSION: 1. Negative for bowel obstruction or convincing inflammation. 2. Hepatic steatosis and atherosclerosis. Electronically Signed   By: JMonte FantasiaM.D.   On: 06/10/2018 09:09    Pending Labs Unresulted Labs (From admission, onward)    Start     Ordered   06/11/18 0500  TSH  Tomorrow morning,   R     06/10/18 1000   06/11/18 0500  CBC with Differential/Platelet  Tomorrow morning,   R     06/10/18 1000   06/11/18 0500  Comprehensive metabolic panel  Tomorrow morning,   R     06/10/18 1000   06/10/18 0736  Gastrointestinal Panel by PCR , Stool  (Gastrointestinal Panel by PCR, Stool)  Once,   R     06/10/18 0737          Vitals/Pain Today's Vitals   06/10/18 0730 06/10/18 0800 06/10/18 0900 06/10/18 0930  BP: 139/80 136/86 (!) 149/95 (!) 146/90  Pulse: 98 93 (!) 108 (!) 105  Resp: 12 11 14  (!) 21  Temp:      SpO2: 94% 99% 97% 99%  Weight:      Height:       PainSc:        Isolation Precautions Enteric precautions (UV disinfection)  Medications Medications  potassium chloride 10 mEq in 100 mL IVPB (10 mEq Intravenous New Bag/Given 06/10/18 0954)  sodium chloride (PF) 0.9 % injection (has no administration in time range)  magnesium sulfate IVPB 4 g 100 mL (has no administration in time range)  acetaminophen (TYLENOL) tablet 650 mg (has no administration in time range)  lactated ringers 1,000 mL with potassium chloride 20 mEq/L Pediatric IV infusion (has no administration in time range)  cyanocobalamin ((VITAMIN B-12)) injection 1,000 mcg (has no administration in time range)  gabapentin (NEURONTIN) capsule 400-1,200 mg (has no administration in time range)  hydrocortisone (CORTEF) tablet 5 mg (has no administration in time range)  levETIRAcetam (KEPPRA) tablet 250 mg (has no administration in time range)  methimazole (TAPAZOLE) tablet 5 mg (has no administration in time range)  lipase/protease/amylase (CREON) capsule 36,000 Units (has no administration in time range)  pantoprazole (PROTONIX) EC tablet 40 mg (has no administration in time range)  pramoxine-hydrocortisone (PROCTOCREAM-HC) rectal cream 1 application (has no administration in time range)  promethazine (PHENERGAN) tablet 25 mg (has no administration in time range)  propranolol ER (INDERAL LA) 24 hr capsule 80 mg (has no administration in time range)  traZODone (DESYREL) tablet 50 mg (has no administration in time range)  vedolizumab (ENTYVIO) injection 300 mg (has no administration in time range)  cholecalciferol (VITAMIN D3) tablet 1,000 Units (has no administration in time range)  zolmitriptan (ZOMIG) nasal solution 1 spray (has no administration  in time range)  sodium chloride 0.9 % bolus 1,000 mL (0 mLs Intravenous Stopped 06/10/18 0838)  ondansetron (ZOFRAN) injection 4 mg (4 mg Intravenous Given 06/10/18 0725)  magnesium sulfate IVPB 2 g 50 mL (2 g Intravenous New Bag/Given  06/10/18 0836)  lactated ringers bolus 1,000 mL (1,000 mLs Intravenous Bolus 06/10/18 0836)  iohexol (OMNIPAQUE) 300 MG/ML solution 100 mL (100 mLs Intravenous Contrast Given 06/10/18 0839)  prochlorperazine (COMPAZINE) injection 10 mg (10 mg Intravenous Given 06/10/18 0828)  diphenhydrAMINE (BENADRYL) injection 25 mg (25 mg Intravenous Given 06/10/18 0828)  morphine 4 MG/ML injection 4 mg (4 mg Intravenous Given 06/10/18 0952)    Mobility walks

## 2018-06-10 NOTE — ED Notes (Signed)
Pt requesting something else for nausea. PA made aware

## 2018-06-10 NOTE — ED Notes (Signed)
Pt updated on plan of care

## 2018-06-10 NOTE — ED Notes (Signed)
Patient uses three leg cane to walk. Patient states she has vomited x 2 since 11 pm last night. Patient was admitted for this a month ago. Patient states that her upper abdomen hurts when she is vomiting and her lower abdomen hurts when she defecate. Patient states she can not hold anything on her abdomen.

## 2018-06-10 NOTE — ED Notes (Signed)
Pt aware urine and stool sample needed

## 2018-06-10 NOTE — ED Provider Notes (Signed)
[facility] DEPT Provider Note   CSN: [id number] Arrival date & time: 06/10/18  0601    History   Chief Complaint Chief Complaint  Patient presents with  . Emesis    HPI [name] is a 58 y.o. female with a history of Crohn's disease, hyperthyroidism, pancreatic insufficiency, Addison's disease, B12 deficiency, HLD, cyclical vomiting syndrome, and gastroparesis who presents to the emergency department with a chief complaint of nausea, vomiting, and diarrhea.  The patient endorses nausea and nonbloody, nonbilious vomiting for the last 10 days.  She is unable to recall how many times she has vomited in the last 24 hours, but reports that she has vomited twice since 2300.  She reports countless episodes of nonbloody loose stools.  She has a history of similar for which she has been admitted for electrolyte abnormalities.  She reports a history of chronic diarrhea, but reports an increase in frequency over the last 10 days.  She reports that she may have had 2 small dribbles when attempting to void over the last 24 hours.  She reports that she has been unable to even keep down more than a couple sips of water or any food for days.   She reports associated left-sided cramping abdominal pain that is constant with sharp, nonradiating pain in the right lower quadrant.  She reports a history of similar pain, but does not feel as if the pain feels similar to previous Crohn's flare.  She denies fever, chills, chest pain, shortness of breath, dysuria, hematuria, vaginal complaints, constipation, back pain, rash, dizziness, lightheadedness, or syncope.  She was seen by endocrinology on 3/26 and was started on methimazole.  No other recent medication changes or new medications.  She is on Entyvio for Crohn's disease.  Last colonoscopy was in January by Dr. Silverio Decamp.  Last EGD was 2019.  No recent antibiotic use.  Surgical history includes hysterectomy and previous  colon resection x2.    The history is provided by the patient. No language interpreter was used.    Past Medical History:  Diagnosis Date  . Arthritis   . Chronic diarrhea   . Chronic disease anemia   . Crohn's disease ([place])   . Depression   . Family history of malignant neoplasm of gastrointestinal tract   . GERD (gastroesophageal reflux disease)   . H/O hiatal hernia   . History of adenomatous polyp of colon   . History of gastritis    AND HX ILEITIS  . History of kidney stones   . History of small bowel obstruction    SECONDARY CROHN'S STRICTURE  S/P COLECTOMY  . Hyperlipidemia   . Hypertension   . Hypopotassemia   . Internal hemorrhoids   . Kidney stones   . Pancreatic insufficiency   . PONV (postoperative nausea and vomiting)   . Psoriasis    SKIN  . Right ureteral stone   . Rotavirus infection 05/31/2013  . S/P dilatation of esophageal stricture   . Seasonal asthma   . Urgency of urination   . Vitamin B12 deficiency     Patient Active Problem List   Diagnosis Date Noted  . Adrenal insufficiency (Addison's disease) ([place]) 05/02/2018  . Chronic back pain 05/02/2018  . Seizure ([place]) 05/02/2018  . Bile salt-induced diarrhea   . Nausea and vomiting 05/17/2017  . Dehydration   . Urinary tract infection without hematuria 05/16/2017  . Abnormal x-ray of abdomen   . CC (Crohn's colitis) ([place]) 03/03/2017  . Malnutrition  of moderate degree 03/02/2017  . Crohn's colitis ([place]) 03/01/2017  . Weight loss 03/01/2017  . Crohn's disease (regional enteritis) ([place]) 07/19/2016  . Ileus ([place])   . Small bowel obstruction ([place])   . SBO (small bowel obstruction) ([place])   . Hyperglycemia 05/25/2016  . Encounter for imaging study to confirm nasogastric (NG) tube placement   . N&V (nausea and vomiting) 05/24/2016  . Pancreatic insufficiency   . Viral gastroenteritis 02/20/2016  . Hypokalemia 02/20/2016  . Hypomagnesemia 02/20/2016  . Colitis   . Low TSH level 09/26/2013  .  Rotavirus infection 05/31/2013  . Protein-calorie malnutrition, severe ([place]) 05/30/2013  . Crohn's disease involving terminal ileum ([place]) 05/28/2013  . Skin lesion 12/14/2012  . Weight increase 08/31/2010  . Wears dentures 08/31/2010  . Nausea & vomiting  08/31/2010  . IBS (irritable bowel syndrome) 08/31/2010  . Hiatal hernia 08/31/2010  . Reflux 08/31/2010  . Abdominal pain 08/31/2010  . Rectal bleeding 08/31/2010  . Hemorrhoids 08/31/2010  . Sinus problem/runny nose 08/31/2010  . Wears glasses 08/31/2010  . Asthma 08/30/2010  . COUGH 04/02/2010  . Crohn's disease ([place]) 02/03/2009  . DIARRHEA 02/03/2009  . VITAMIN B12 DEFICIENCY 12/17/2008  . SBO 09/12/2008  . PSORIASIS 11/07/2007  . DEPRESSION 08/31/2007  . HEARING LOSS 08/31/2007  . GERD (gastroesophageal reflux disease) 08/31/2007  . HIATAL HERNIA 08/31/2007  . COLONIC POLYPS, ADENOMATOUS, HX OF 08/31/2007    Past Surgical History:  Procedure Laterality Date  . ANAL FISSURE REPAIR  1990's  . CARPAL TUNNEL RELEASE Right 2000  . CHOLECYSTECTOMY  2002  . CYSTOSCOPY WITH RETROGRADE PYELOGRAM, URETEROSCOPY AND STENT PLACEMENT Left 03/08/2013   Procedure: CYSTOSCOPY WITH RETROGRADE PYELOGRAM, URETEROSCOPY AND STENT PLACEMENT stone basketing;  Surgeon: Alexis Frock, MD;  Location: [facility];  Service: Urology;  Laterality: Left;  . CYSTOSCOPY WITH RETROGRADE PYELOGRAM, URETEROSCOPY AND STENT PLACEMENT Right 04/03/2013   Procedure: CYSTOSCOPY WITH RETROGRADE PYELOGRAM, URETEROSCOPY AND STENT PLACEMENT;  Surgeon: Alexis Frock, MD;  Location: [facility];  Service: Urology;  Laterality: Right;  . DX LAPAROSCOPY CONVERTED TO OPEN RIGHT COLECCTOMY  09-15-2010   ANASTOMOTIC STRICTURE  . LAPAROSCOPIC ILEOCECECTOMY  10-09-2008   AND APPENDECTOMY (TERMINAL ILEITIS & PARTIAL SBO)  . SPINAL CORD STIMULATOR IMPLANT Left 05/2017  . VAGINAL HYSTERECTOMY  1992     OB History   No obstetric history on file.      Home  Medications    Prior to Admission medications   Medication Sig Start Date End Date Taking? Authorizing Provider  albuterol (PROAIR HFA) 108 (90 BASE) MCG/ACT inhaler Inhale 2 puffs into the lungs every 4 (four) hours as needed for wheezing or shortness of breath.     [provider]  clobetasol cream (TEMOVATE) 3.81 % Apply 1 application topically 2 (two) times daily.  03/12/17   [provider]  colestipol (COLESTID) 1 g tablet Take 1 tablet (1 g total) by mouth 3 (three) times daily. 07/07/17   Mauri Pole, MD  cyanocobalamin (,VITAMIN B-12,) 1000 MCG/ML injection Inject 71m SQ once every 7 days x 4 then inject 1 ml SQ every 28 days. 09/26/17   NMauri Pole MD  dronabinol (MARINOL) 2.5 MG capsule Take 2.5 mg by mouth 2 (two) times daily. 04/03/18   [provider]  econazole nitrate 1 % cream APPLY  CREAM TOPICALLY ONCE DAILY Patient taking differently: Apply 1 application topically daily.  10/25/16   NMauri Pole MD  gabapentin (NEURONTIN) 400 MG capsule Take  1 capsule by mouth at lunch, 2 at dinner and 3 at bedtime 08/27/16   [provider]  hydrocortisone (ANUSOL-HC) 2.5 % rectal cream Place 1 application rectally 2 (two) times daily. 07/10/17   Mauri Pole, MD  hydrocortisone (CORTEF) 5 MG tablet Take 5 mg by mouth 2 (two) times daily. 03/04/17   [provider]  hyoscyamine (LEVSIN SL) 0.125 MG SL tablet Place 0.125 mg under the tongue 4 (four) times daily.    [provider]  levETIRAcetam (KEPPRA) 250 MG tablet Take 250 mg by mouth 2 (two) times daily. 02/12/16   [provider]  lipase/protease/amylase (CREON) 36000 UNITS CPEP capsule Take 2  ( 72,000units) by mouth three times a day with meals one (36,000units) with snacks This is a dose increase for this -patient 08/17/17   Mauri Pole, MD  Needles & Syringes MISC 1 Syringe by Does not apply route every 30 (thirty) days. 09/26/17   Mauri Pole, MD  omeprazole (PRILOSEC) 40 MG capsule Take 1 capsule (40 mg total) by mouth daily. 09/20/17   Mauri Pole, MD  Oxycodone HCl 10 MG TABS Take 10 mg by mouth 5 (five) times daily as needed (pain). 1 tablet up to five times a day as needed for severe pain.     [provider]  pramoxine-hydrocortisone (PROCTOCREAM-HC) 1-1 % rectal cream Place 1 application rectally 2 (two) times daily. 07/07/17   Mauri Pole, MD  prochlorperazine (COMPAZINE) 10 MG tablet Take 10 mg by mouth 2 (two) times daily. 04/26/18   [provider]  promethazine (PHENERGAN) 12.5 MG tablet Take 1 tablet (12.5 mg total) by mouth every 6 (six) hours as needed for nausea or vomiting. 05/05/18   Hosie Poisson, MD  promethazine (PHENERGAN) 25 MG tablet TAKE 1 TABLET BY MOUTH EVERY 6 HOURS AS NEEDED FOR NAUSEA AND VOMITING 06/05/18   Mauri Pole, MD  propranolol ER (INDERAL LA) 80 MG 24 hr capsule Take 1 capsule by mouth daily. 08/22/16   [provider]  RABEprazole (ACIPHEX) 20 MG tablet Take 20 mg by mouth daily.    [provider]  tiZANidine (ZANAFLEX) 4 MG tablet Take 4 mg by mouth every 8 (eight) hours as needed for muscle spasms.     [provider]  traZODone (DESYREL) 50 MG tablet Take 50 mg by mouth at bedtime.  03/14/17   [provider]  vedolizumab (ENTYVIO) 300 MG injection Inject 300 mg into the vein every 8 (eight) weeks.    [provider]  VITAMIN D PO Take 1 tablet by mouth daily.    [provider]  zolmitriptan (ZOMIG) 5 MG nasal solution 1 spray in one nostril.  May repeat 1 spray once after 2 hours if needed. Not to exceed 2 sprays in 24 hours 04/11/16   Pieter Partridge, DO    Family History Family History  Problem Relation Age of Onset  . Colon cancer Mother   . Colon polyps Mother   . Colon polyps Father   . Lung cancer Father   . Breast cancer Paternal Grandmother   . Colon polyps Brother   . Thyroid disease  Neg Hx   . Stomach cancer Neg Hx   . Pancreatic cancer Neg Hx     Social History Social History   Tobacco Use  . Smoking status: Former Smoker    Packs/day: 1.00    Years: 10.00    Pack years: 10.00  Types: Cigarettes    Last attempt to quit: 03/07/1984    Years since quitting: 34.2  . Smokeless tobacco: Never Used  Substance Use Topics  . Alcohol use: No    Alcohol/week: 0.0 standard drinks  . Drug use: No     Allergies   Codeine; Amitriptyline; and Humira [adalimumab]   Review of Systems Review of Systems  Constitutional: Negative for activity change, chills and fever.  Respiratory: Negative for shortness of breath.   Cardiovascular: Negative for chest pain.  Gastrointestinal: Positive for abdominal pain, diarrhea, nausea and vomiting. Negative for blood in stool and constipation.  Genitourinary: Negative for dysuria, flank pain, frequency, urgency, vaginal discharge and vaginal pain.  Musculoskeletal: Negative for back pain, myalgias, neck pain and neck stiffness.  Skin: Negative for rash.  Allergic/Immunologic: Negative for immunocompromised state.  Neurological: Negative for dizziness, seizures, weakness, numbness and headaches.  Psychiatric/Behavioral: Negative for confusion.   Physical Exam Updated Vital Signs BP 139/80   Pulse 98   Temp 98.5 F (36.9 C)   Resp 12   Ht 5' 7"  (1.702 m)   Wt 82.1 kg   SpO2 94%   BMI 28.35 kg/m   Physical Exam Vitals signs and nursing note reviewed.  Constitutional:      General: She is not in acute distress.    Appearance: She is not ill-appearing, toxic-appearing or diaphoretic.  HENT:     Head: Normocephalic.  Eyes:     General: No scleral icterus.       Right eye: No discharge.        Left eye: No discharge.     Conjunctiva/sclera: Conjunctivae normal.  Neck:     Musculoskeletal: Normal range of motion and neck supple. No muscular tenderness.  Cardiovascular:     Rate and Rhythm: Normal rate and regular  rhythm.     Pulses: Normal pulses.     Heart sounds: Normal heart sounds. No murmur. No friction rub. No gallop.   Pulmonary:     Effort: Pulmonary effort is normal. No respiratory distress.     Breath sounds: No stridor. No wheezing, rhonchi or rales.  Chest:     Chest wall: No tenderness.  Abdominal:     General: There is no distension.     Palpations: Abdomen is soft. There is no mass.     Tenderness: There is abdominal tenderness. There is no right CVA tenderness, left CVA tenderness, guarding or rebound.     Hernia: No hernia is present.     Comments: Mild tenderness to palpation along the left upper and lower abdomen.  Moderately tender to palpation of the right lower quadrant.  No rebound or guarding.  No CVA tenderness bilaterally.  No focal tenderness over McBurney's point.  Negative Rovsing sign.  Negative Murphy sign.  Lymphadenopathy:     Cervical: No cervical adenopathy.  Skin:    General: Skin is warm.     Capillary Refill: Capillary refill takes less than 2 seconds.     Findings: No rash.  Neurological:     Mental Status: She is alert.  Psychiatric:        Behavior: Behavior normal.      ED Treatments / Results  Labs (all labs ordered are listed, but only abnormal results are displayed) Labs Reviewed  CBC WITH DIFFERENTIAL/PLATELET - Abnormal; Notable for the following components:      Result Value   Hemoglobin 15.1 (*)    All other components within normal limits  MAGNESIUM -  Abnormal; Notable for the following components:   Magnesium 1.4 (*)    All other components within normal limits  COMPREHENSIVE METABOLIC PANEL - Abnormal; Notable for the following components:   Potassium 2.4 (*)    Glucose, Bld 124 (*)    Total Protein 8.6 (*)    All other components within normal limits  GASTROINTESTINAL PANEL BY PCR, STOOL (REPLACES STOOL CULTURE)  LIPASE, BLOOD  URINALYSIS, ROUTINE W REFLEX MICROSCOPIC    EKG EKG Interpretation  Date/Time:  Sunday June 10 2018 06:58:25 EDT Ventricular Rate:  108 PR Interval:    QRS Duration: 104 QT Interval:  352 QTC Calculation: 472 R Axis:   37 Text Interpretation:  Sinus tachycardia RSR' in V1 or V2, right VCD or RVH No acute changes No significant change since last tracing Confirmed by Nanavati, Ankit ([id number]) on 06/10/2018 7:35:58 AM   Radiology No results found.  Procedures Procedures (including critical care time)  Medications Ordered in ED Medications  potassium chloride 10 mEq in 100 mL IVPB (has no administration in time range)  magnesium sulfate IVPB 2 g 50 mL (has no administration in time range)  lactated ringers bolus 1,000 mL (has no administration in time range)  sodium chloride 0.9 % bolus 1,000 mL (1,000 mLs Intravenous Bolus 06/10/18 0724)  ondansetron (ZOFRAN) injection 4 mg (4 mg Intravenous Given 06/10/18 0725)     Initial Impression / Assessment and Plan / ED Course  I have reviewed the triage vital signs and the nursing notes.  Pertinent labs & imaging results that were available during my care of the patient were reviewed by me and considered in my medical decision making (see chart for details).        57  year old female with a history of Crohn's disease, hyperthyroidism, pancreatic insufficiency, Addison's disease, B12 deficiency, HLD, cyclical vomiting syndrome, and gastroparesis presenting with nausea, vomiting, and diarrhea for 10 days.  Urine output is significantly decreased.  She is also having abdominal pain.  No constitutional symptoms, urinary symptoms, or vaginal complaints.  She has no chest pain.   No fever.  Tachycardic in the 120s on arrival.  She is normotensive and not hypoxic.  Will order labs, including extensive metabolic work-up, antiemetic as long as her EKG does not demonstrate a prolonged QTC, CT abdomen pelvis in the setting of worsening abdominal pain with a known history of Crohn's.  She declines pain medication at this time.  Patient was seen and  independently evaluated by Dr. Kathrynn Humble, attending physician.  EKG is negative for prolonged QTC.  She has no leukocytosis.  Potassium is low at 2.4.  20 mEQ of IV potassium chloride has been ordered.  Magnesium is also low at 1.4.  2 g of magnesium sulfate has been ordered.  A second IV fluid bolus of LR has also been ordered.  Labs are otherwise pending.  CT abdomen pelvis is pending.  GI PCR stool study is pending.  Patient care transferred to [facility] at the end of my shift and will likely require admission for metabolic abnormalities and symptom management unless she significantly improves in the ER and is able to tolerate fluids by mouth. Patient presentation, ED course, and plan of care discussed with review of all pertinent labs and imaging. Please see his/her note for further details regarding further ED course and disposition.   Final Clinical Impressions(s) / ED Diagnoses   Final diagnoses:  None    ED Discharge Orders    None  Joline Maxcy A, PA-C 06/10/18 [id number]    Varney Biles, MD 06/11/18 [phone]

## 2018-06-10 NOTE — ED Notes (Signed)
Requested urine from patient. 

## 2018-06-10 NOTE — ED Notes (Signed)
Date and time results received: 06/10/18 0734 (use smartphrase ".now" to insert current time)  Test: K+ Critical Value: 2.4  Name of Provider Notified: Crystal, RN  Orders Received? Or Actions Taken?: Actions Taken: Engineer, structural

## 2018-06-10 NOTE — ED Notes (Signed)
Patient transported to CT 

## 2018-06-10 NOTE — H&P (Addendum)
History and Physical  [name] ZSW:[id number] DOB: 09-Apr-1960 DOA: 06/10/2018  Referring physician: PA Merleen Nicely PCP: Rochel Brome, MD  Outpatient Specialists: GI Patient coming from: Home  Chief Complaint: Nausea, vomiting and diarrhea  HPI: [name] is a 58 y.o. female with medical history significant for Crohn's disease, hypothyroidism, pancreatic insufficiency, Addison's disease, B12 deficiency, hyperlipidemia, cyclical vomiting syndrome, and gastroparesis who presented to the ED at [facility] long with complaints of nausea vomiting and diarrhea of 10 days duration. Denies fever, chills or night sweats. No sick contacts. She called GI and left a message. She has not been able to tolerate oral intakes.  No worsening or improving factors. States that present symptoms do not feel like her prior Crohn's flare but feel like prior gastroenteritis.  She is on Entyvio for Crohn's disease.  Last colonoscopy was in January 2020.    ED Course: Upon presentation to the ED, patient is tachycardic with persistent mucosy diarrhea x 8 times since this am.  GI panel ordered and pending.  Lab studies remarkable for hypokalemia and hypomagnesemia.  Afebrile with no leukocytosis.  TRH asked to admit for dehydration in the setting of poor oral intake and persistent diarrhea.  Review of Systems: Review of systems as noted in the HPI. All other systems reviewed and are negative.   Past Medical History:  Diagnosis Date  . Arthritis   . Chronic diarrhea   . Chronic disease anemia   . Crohn's disease ([place])   . Depression   . Family history of malignant neoplasm of gastrointestinal tract   . GERD (gastroesophageal reflux disease)   . H/O hiatal hernia   . History of adenomatous polyp of colon   . History of gastritis    AND HX ILEITIS  . History of kidney stones   . History of small bowel obstruction    SECONDARY CROHN'S STRICTURE  S/P COLECTOMY  . Hyperlipidemia   . Hypertension   .  Hypopotassemia   . Internal hemorrhoids   . Kidney stones   . Pancreatic insufficiency   . PONV (postoperative nausea and vomiting)   . Psoriasis    SKIN  . Right ureteral stone   . Rotavirus infection 05/31/2013  . S/P dilatation of esophageal stricture   . Seasonal asthma   . Urgency of urination   . Vitamin B12 deficiency    Past Surgical History:  Procedure Laterality Date  . ANAL FISSURE REPAIR  1990's  . CARPAL TUNNEL RELEASE Right 2000  . CHOLECYSTECTOMY  2002  . CYSTOSCOPY WITH RETROGRADE PYELOGRAM, URETEROSCOPY AND STENT PLACEMENT Left 03/08/2013   Procedure: CYSTOSCOPY WITH RETROGRADE PYELOGRAM, URETEROSCOPY AND STENT PLACEMENT stone basketing;  Surgeon: Alexis Frock, MD;  Location: [facility];  Service: Urology;  Laterality: Left;  . CYSTOSCOPY WITH RETROGRADE PYELOGRAM, URETEROSCOPY AND STENT PLACEMENT Right 04/03/2013   Procedure: CYSTOSCOPY WITH RETROGRADE PYELOGRAM, URETEROSCOPY AND STENT PLACEMENT;  Surgeon: Alexis Frock, MD;  Location: [facility];  Service: Urology;  Laterality: Right;  . DX LAPAROSCOPY CONVERTED TO OPEN RIGHT COLECCTOMY  09-15-2010   ANASTOMOTIC STRICTURE  . LAPAROSCOPIC ILEOCECECTOMY  10-09-2008   AND APPENDECTOMY (TERMINAL ILEITIS & PARTIAL SBO)  . SPINAL CORD STIMULATOR IMPLANT Left 05/2017  . VAGINAL HYSTERECTOMY  1992    Social History:  reports that she quit smoking about 34 years ago. Her smoking use included cigarettes. She has a 10.00 pack-year smoking history. She has never used smokeless tobacco. She reports that she does not drink alcohol or use  drugs.   Allergies  Allergen Reactions  . Codeine Hives and Nausea And Vomiting  . Amitriptyline Itching and Hives  . Humira [Adalimumab] Rash    Family History  Problem Relation Age of Onset  . Colon cancer Mother   . Colon polyps Mother   . Colon polyps Father   . Lung cancer Father   . Breast cancer Paternal Grandmother   . Colon polyps Brother   . Thyroid disease  Neg Hx   . Stomach cancer Neg Hx   . Pancreatic cancer Neg Hx       Prior to Admission medications   Medication Sig Start Date End Date Taking? Authorizing Provider  albuterol (PROAIR HFA) 108 (90 BASE) MCG/ACT inhaler Inhale 2 puffs into the lungs every 4 (four) hours as needed for wheezing or shortness of breath.     [provider]  clobetasol cream (TEMOVATE) 2.53 % Apply 1 application topically 2 (two) times daily.  03/12/17   [provider]  colestipol (COLESTID) 1 g tablet Take 1 tablet (1 g total) by mouth 3 (three) times daily. 07/07/17   Mauri Pole, MD  cyanocobalamin (,VITAMIN B-12,) 1000 MCG/ML injection Inject 86m SQ once every 7 days x 4 then inject 1 ml SQ every 28 days. 09/26/17   NMauri Pole MD  dronabinol (MARINOL) 2.5 MG capsule Take 2.5 mg by mouth 2 (two) times daily. 04/03/18   [provider]  econazole nitrate 1 % cream APPLY  CREAM TOPICALLY ONCE DAILY Patient taking differently: Apply 1 application topically daily.  10/25/16   NMauri Pole MD  gabapentin (NEURONTIN) 400 MG capsule Take 1 capsule by mouth at lunch, 2 at dinner and 3 at bedtime 08/27/16   [provider]  hydrocortisone (ANUSOL-HC) 2.5 % rectal cream Place 1 application rectally 2 (two) times daily. 07/10/17   NMauri Pole MD  hydrocortisone (CORTEF) 5 MG tablet Take 5 mg by mouth 2 (two) times daily. 03/04/17   [provider]  hyoscyamine (LEVSIN SL) 0.125 MG SL tablet Place 0.125 mg under the tongue 4 (four) times daily.    [provider]  levETIRAcetam (KEPPRA) 250 MG tablet Take 250 mg by mouth 2 (two) times daily. 02/12/16   [provider]  lipase/protease/amylase (CREON) 36000 UNITS CPEP capsule Take 2  ( 72,000units) by mouth three times a day with meals one (36,000units) with snacks This is a dose increase for this -patient 08/17/17   NMauri Pole MD  methimazole (TAPAZOLE) 5 MG tablet Take 5 mg by  mouth daily. 06/01/18   [provider]  Needles & Syringes MISC 1 Syringe by Does not apply route every 30 (thirty) days. 09/26/17   NMauri Pole MD  omeprazole (PRILOSEC) 40 MG capsule Take 1 capsule (40 mg total) by mouth daily. 09/20/17   NMauri Pole MD  Oxycodone HCl 10 MG TABS Take 10 mg by mouth 5 (five) times daily as needed (pain). 1 tablet up to five times a day as needed for severe pain.     [provider]  pramoxine-hydrocortisone (PROCTOCREAM-HC) 1-1 % rectal cream Place 1 application rectally 2 (two) times daily. 07/07/17   NMauri Pole MD  prochlorperazine (COMPAZINE) 10 MG tablet Take 10 mg by mouth 2 (two) times daily. 04/26/18   [provider]  promethazine (PHENERGAN) 12.5 MG tablet Take 1 tablet (12.5 mg total) by mouth every 6 (six) hours as needed for nausea or vomiting. 05/05/18  Hosie Poisson, MD  promethazine (PHENERGAN) 25 MG tablet TAKE 1 TABLET BY MOUTH EVERY 6 HOURS AS NEEDED FOR NAUSEA AND VOMITING 06/05/18   Mauri Pole, MD  propranolol ER (INDERAL LA) 80 MG 24 hr capsule Take 1 capsule by mouth daily. 08/22/16   [provider]  RABEprazole (ACIPHEX) 20 MG tablet Take 20 mg by mouth daily.    [provider]  tiZANidine (ZANAFLEX) 4 MG tablet Take 4 mg by mouth every 8 (eight) hours as needed for muscle spasms.     [provider]  traZODone (DESYREL) 50 MG tablet Take 50 mg by mouth at bedtime.  03/14/17   [provider]  vedolizumab (ENTYVIO) 300 MG injection Inject 300 mg into the vein every 8 (eight) weeks.    [provider]  VITAMIN D PO Take 1 tablet by mouth daily.    [provider]  zolmitriptan (ZOMIG) 5 MG nasal solution 1 spray in one nostril.  May repeat 1 spray once after 2 hours if needed. Not to exceed 2 sprays in 24 hours 04/11/16   Pieter Partridge, DO    Physical Exam: BP (!) 149/95   Pulse (!) 108   Temp 98.5 F (36.9 C)   Resp 14   Ht 5' 7"   (1.702 m)   Wt 82.1 kg   SpO2 97%   BMI 28.35 kg/m   . General: 58 y.o. year-old female well developed well nourished in no acute distress.  Alert and oriented x3.  Dry mucous membranes. . Cardiovascular: Regular rate and rhythm with no rubs or gallops.  No thyromegaly or JVD noted.  No lower extremity edema. 2/4 pulses in all 4 extremities. Marland Kitchen Respiratory: Clear to auscultation with no wheezes or rales. Good inspiratory effort. . Abdomen: Soft with mild right lower quadrant tenderness on palpation. Nondistended with normal bowel sounds x4 quadrants. . Muskuloskeletal: No cyanosis, clubbing or edema noted bilaterally . Neuro: CN II-XII intact, strength, sensation, reflexes . Skin: No ulcerative lesions noted or rashes . Psychiatry: Judgement and insight appear normal. Mood is appropriate for condition and setting          Labs on Admission:  Basic Metabolic Panel: Recent Labs  Lab 06/10/18 0625  NA 137  K 2.4*  CL 101  CO2 23  GLUCOSE 124*  BUN 13  CREATININE 0.93  CALCIUM 9.1  MG 1.4*   Liver Function Tests: Recent Labs  Lab 06/10/18 0625  AST 22  ALT 22  ALKPHOS 117  BILITOT 0.8  PROT 8.6*  ALBUMIN 4.3   Recent Labs  Lab 06/10/18 0625  LIPASE 37   No results for input(s): AMMONIA in the last 168 hours. CBC: Recent Labs  Lab 06/10/18 0625  WBC 10.1  NEUTROABS 6.5  HGB 15.1*  HCT 45.3  MCV 90.1  PLT 274   Cardiac Enzymes: No results for input(s): CKTOTAL, CKMB, CKMBINDEX, TROPONINI in the last 168 hours.  BNP (last 3 results) No results for input(s): BNP in the last 8760 hours.  ProBNP (last 3 results) No results for input(s): PROBNP in the last 8760 hours.  CBG: No results for input(s): GLUCAP in the last 168 hours.  Radiological Exams on Admission: Ct Abdomen Pelvis W Contrast  Result Date: 06/10/2018 CLINICAL DATA:  Nausea and vomiting EXAM: CT ABDOMEN AND PELVIS WITH CONTRAST TECHNIQUE: Multidetector CT imaging of the abdomen and pelvis was  performed using the standard protocol following bolus administration of intravenous contrast. CONTRAST:  121m OMNIPAQUE  IOHEXOL 300 MG/ML  SOLN COMPARISON:  05/02/2018 FINDINGS: Lower chest:  No contributory findings. Hepatobiliary: No focal liver abnormality. Hepatic steatosis. Cholecystectomy. No bile duct dilatation Pancreas: Unremarkable. Spleen: Unremarkable. Adrenals/Urinary Tract: Negative adrenals. No hydronephrosis or stone. Unremarkable bladder. Stomach/Bowel: Right hemicolectomy. Submucosal low-density diffusely in the colon primarily attributed to fat deposition rather than colitis given the homogeneous appearance and lack of associated fat inflammation. Negative for bowel obstruction. Vascular/Lymphatic: No acute vascular abnormality. Atherosclerotic calcification. No mass or adenopathy. Reproductive:Hysterectomy with negative ovaries. Other: No ascites or pneumoperitoneum. Musculoskeletal: Dorsal column stimulator leads. IMPRESSION: 1. Negative for bowel obstruction or convincing inflammation. 2. Hepatic steatosis and atherosclerosis. Electronically Signed   By: Monte Fantasia M.D.   On: 06/10/2018 09:09    EKG: I independently viewed the EKG done and my findings are as followed: Sinus tachycardia rate of 107 with no specific ST-T changes.  Assessment/Plan Present on Admission: . Acute dehydration  Active Problems:   Acute dehydration  Acute dehydration in the setting of nausea vomiting and diarrhea Presented with nausea vomiting and diarrhea x10 days Diarrhea persists in the ED GI panel ordered and pending IV Zofran PRN for nausea IV fluid hydration lactated Ringer with 20 mEq of potassium at 100 cc/h Monitor I&O Start clear liquid diet Will need to follow-up with GI outpatient Consult GI if no improvement  Persistent diarrhea suspect viral gastroenteritis in the setting of crohn's disease Afebrile with no leukocytosis GI panel is pending Avoid dehydration CT abd pelvis  unrevealing  Hypokalemia and hypomagnesemia Presented with potassium 2.4 and magnesium of 1.4 Repleted magnesium with 6 g IV Mg2+once Repleted potassium with IV KCl infusion Repeat labs in the morning  Crohn's disease Resume Crohn's medications Follow-up with GI outpatient  Hyperthyroidism Obtain TSH Resume methimazole  Seizure disorder Resume Keppra    DVT prophylaxis: Subcu Lovenox daily  Code Status: Full code  Family Communication: None at bedside  Disposition Plan: Admit to [place] called: None  Admission status: Observation status    Kayleen Memos MD [facility] Pager [phone]  If 7PM-7AM, please contact night-coverage [email or web address] Password [facility]  06/10/2018, 9:44 AM

## 2018-06-11 DIAGNOSIS — E876 Hypokalemia: Secondary | ICD-10-CM | POA: Diagnosis not present

## 2018-06-11 DIAGNOSIS — Z8719 Personal history of other diseases of the digestive system: Secondary | ICD-10-CM | POA: Diagnosis not present

## 2018-06-11 DIAGNOSIS — R112 Nausea with vomiting, unspecified: Secondary | ICD-10-CM | POA: Diagnosis present

## 2018-06-11 DIAGNOSIS — E869 Volume depletion, unspecified: Secondary | ICD-10-CM | POA: Diagnosis not present

## 2018-06-11 DIAGNOSIS — Z9049 Acquired absence of other specified parts of digestive tract: Secondary | ICD-10-CM | POA: Diagnosis not present

## 2018-06-11 DIAGNOSIS — Z90710 Acquired absence of both cervix and uterus: Secondary | ICD-10-CM | POA: Diagnosis not present

## 2018-06-11 DIAGNOSIS — K529 Noninfective gastroenteritis and colitis, unspecified: Secondary | ICD-10-CM | POA: Diagnosis not present

## 2018-06-11 DIAGNOSIS — K50919 Crohn's disease, unspecified, with unspecified complications: Secondary | ICD-10-CM | POA: Diagnosis not present

## 2018-06-11 DIAGNOSIS — Z79899 Other long term (current) drug therapy: Secondary | ICD-10-CM | POA: Diagnosis not present

## 2018-06-11 DIAGNOSIS — Z86010 Personal history of colonic polyps: Secondary | ICD-10-CM | POA: Diagnosis not present

## 2018-06-11 DIAGNOSIS — Z800 Family history of malignant neoplasm of digestive organs: Secondary | ICD-10-CM | POA: Diagnosis not present

## 2018-06-11 DIAGNOSIS — K760 Fatty (change of) liver, not elsewhere classified: Secondary | ICD-10-CM | POA: Diagnosis present

## 2018-06-11 DIAGNOSIS — G40909 Epilepsy, unspecified, not intractable, without status epilepticus: Secondary | ICD-10-CM | POA: Diagnosis present

## 2018-06-11 DIAGNOSIS — Z79891 Long term (current) use of opiate analgesic: Secondary | ICD-10-CM | POA: Diagnosis not present

## 2018-06-11 DIAGNOSIS — E271 Primary adrenocortical insufficiency: Secondary | ICD-10-CM | POA: Diagnosis present

## 2018-06-11 DIAGNOSIS — K3184 Gastroparesis: Secondary | ICD-10-CM | POA: Diagnosis present

## 2018-06-11 DIAGNOSIS — Z87891 Personal history of nicotine dependence: Secondary | ICD-10-CM | POA: Diagnosis not present

## 2018-06-11 DIAGNOSIS — E860 Dehydration: Secondary | ICD-10-CM | POA: Diagnosis not present

## 2018-06-11 DIAGNOSIS — E0590 Thyrotoxicosis, unspecified without thyrotoxic crisis or storm: Secondary | ICD-10-CM | POA: Diagnosis present

## 2018-06-11 DIAGNOSIS — Z8371 Family history of colonic polyps: Secondary | ICD-10-CM | POA: Diagnosis not present

## 2018-06-11 DIAGNOSIS — K5000 Crohn's disease of small intestine without complications: Secondary | ICD-10-CM | POA: Diagnosis present

## 2018-06-11 DIAGNOSIS — Z801 Family history of malignant neoplasm of trachea, bronchus and lung: Secondary | ICD-10-CM | POA: Diagnosis not present

## 2018-06-11 DIAGNOSIS — Z803 Family history of malignant neoplasm of breast: Secondary | ICD-10-CM | POA: Diagnosis not present

## 2018-06-11 DIAGNOSIS — Z885 Allergy status to narcotic agent status: Secondary | ICD-10-CM | POA: Diagnosis not present

## 2018-06-11 DIAGNOSIS — E538 Deficiency of other specified B group vitamins: Secondary | ICD-10-CM | POA: Diagnosis present

## 2018-06-11 DIAGNOSIS — D899 Disorder involving the immune mechanism, unspecified: Secondary | ICD-10-CM

## 2018-06-11 DIAGNOSIS — K8689 Other specified diseases of pancreas: Secondary | ICD-10-CM | POA: Diagnosis present

## 2018-06-11 LAB — COMPREHENSIVE METABOLIC PANEL
ALT: 16 U/L (ref 0–44)
AST: 18 U/L (ref 15–41)
Albumin: 3.8 g/dL (ref 3.5–5.0)
Alkaline Phosphatase: 99 U/L (ref 38–126)
Anion gap: 9 (ref 5–15)
BUN: 6 mg/dL (ref 6–20)
CO2: 25 mmol/L (ref 22–32)
Calcium: 8.4 mg/dL — ABNORMAL LOW (ref 8.9–10.3)
Chloride: 105 mmol/L (ref 98–111)
Creatinine, Ser: 0.7 mg/dL (ref 0.44–1.00)
GFR calc Af Amer: >60 mL/min (ref >60)
GFR calc non Af Amer: >60 mL/min (ref >60)
Glucose, Bld: 99 mg/dL (ref 70–99)
Potassium: 2.6 mmol/L — CL (ref 3.5–5.1)
Sodium: 139 mmol/L (ref 135–145)
Total Bilirubin: 1 mg/dL (ref 0.3–1.2)
Total Protein: 7.2 g/dL (ref 6.5–8.1)

## 2018-06-11 LAB — CBC WITH DIFFERENTIAL/PLATELET
Abs Immature Granulocytes: 0.03 10*3/uL (ref 0.00–0.07)
Basophils Absolute: 0.1 10*3/uL (ref 0.0–0.1)
Basophils Relative: 1 %
Eosinophils Absolute: 0.1 10*3/uL (ref 0.0–0.5)
Eosinophils Relative: 1 %
HCT: 38.8 % (ref 36.0–46.0)
Hemoglobin: 12.5 g/dL (ref 12.0–15.0)
Immature Granulocytes: 1 %
Lymphocytes Relative: 35 %
Lymphs Abs: 1.8 10*3/uL (ref 0.7–4.0)
MCH: 29.5 pg (ref 26.0–34.0)
MCHC: 32.2 g/dL (ref 30.0–36.0)
MCV: 91.5 fL (ref 80.0–100.0)
Monocytes Absolute: 0.5 10*3/uL (ref 0.1–1.0)
Monocytes Relative: 9 %
Neutro Abs: 2.7 10*3/uL (ref 1.7–7.7)
Neutrophils Relative %: 53 %
Platelets: 181 10*3/uL (ref 150–400)
RBC: 4.24 MIL/uL (ref 3.87–5.11)
RDW: 13 % (ref 11.5–15.5)
WBC: 5.1 10*3/uL (ref 4.0–10.5)
nRBC: 0 % (ref 0.0–0.2)

## 2018-06-11 LAB — MAGNESIUM: Magnesium: 2.3 mg/dL (ref 1.7–2.4)

## 2018-06-11 LAB — TSH: TSH: 0.304 u[IU]/mL — ABNORMAL LOW (ref 0.350–4.500)

## 2018-06-11 MED ORDER — ADULT MULTIVITAMIN LIQUID CH
15.0000 mL | Freq: Every day | ORAL | Status: DC
  Administered 2018-06-11: 15 mL via ORAL
  Filled 2018-06-11 (×2): qty 15

## 2018-06-11 MED ORDER — POTASSIUM CHLORIDE 10 MEQ/100ML IV SOLN
10.0000 meq | INTRAVENOUS | Status: AC
  Administered 2018-06-11 (×6): 10 meq via INTRAVENOUS
  Filled 2018-06-11 (×6): qty 100

## 2018-06-11 MED ORDER — FUROSEMIDE 10 MG/ML IJ SOLN: 20.0000 mg | Freq: Once | INTRAMUSCULAR | Status: DC

## 2018-06-11 MED ORDER — ALPRAZOLAM 0.5 MG PO TABS: 0.5000 mg | ORAL_TABLET | ORAL | Status: DC | PRN

## 2018-06-11 NOTE — Progress Notes (Signed)
Initial Nutrition Assessment  INTERVENTION:   -Once GI workup complete, will provide protein supplement (if appropriate at that time). -Provide liquid MVI daily  NUTRITION DIAGNOSIS:   Inadequate oral intake related to nausea, vomiting, diarrhea as evidenced by per patient/family report.  GOAL:   Patient will meet greater than or equal to 90% of their needs  MONITOR:   PO intake, Supplement acceptance, Labs, Weight trends, I & O's  REASON FOR ASSESSMENT:   Malnutrition Screening Tool   ASSESSMENT:   58 y.o. female with medical history significant for Crohn's disease, hypothyroidism, pancreatic insufficiency, Addison's disease, B12 deficiency, hyperlipidemia, cyclical vomiting syndrome, and gastroparesis who presented to the ED at [facility] long with complaints of nausea vomiting and diarrhea of 10 days duration. Admitted for dehydration in the setting of poor oral intake and persistent diarrhea.  **RD working remotely**  Pt reports having N/V/D for 10 days PTA, pt has not been able to eat or keep any food down during that time. Pt had similar symptoms a month ago and came to the ED then. Suspect pt hasn't been consuming adequate nutrition for at least a month. Given Crohn's disease, pt doesn't tolerate milky supplements well. She has had Boost Breeze in the past but did not like them. GI has been consulted per MD, will monitor before placing order for supplements at this time. Currently on clear liquid diet.  If patient continues to be unable to tolerate PO intake, may need to consider nutrition support.  Per weight records, pt weighed 159 lb in March 2019. Most recently pt weighed 190 lb 05/16/18, has since lost 9 lb which is 4% wt loss x 1 month, insignificant for time frame. Will continue to monitor weight trends.   Medications: Vitamin D tablet daily, IV Vitamin B-12, Creon capsule TID, Lactated Ringers infusion, IV KCl, Phenergan tablet PRN Labs reviewed: Low K Mg  WNL  NUTRITION - FOCUSED PHYSICAL EXAM:  Unable to perform per department requirements to work remotely.   Diet Order:   Diet Order            Diet clear liquid Room service appropriate? Yes; Fluid consistency: Thin  Diet effective now              EDUCATION NEEDS:   No education needs have been identified at this time  Skin:  Skin Assessment: Reviewed RN Assessment  Last BM:  4/6 -loose  Height:   Ht Readings from Last 1 Encounters:  06/10/18 5' 7"  (1.702 m)    Weight:   Wt Readings from Last 1 Encounters:  06/10/18 82.1 kg    Ideal Body Weight:  61.4 kg  BMI:  Body mass index is 28.35 kg/m.  Estimated Nutritional Needs:   Kcal:  1800-2000  Protein:  80-90g  Fluid:  2L/day  Clayton Bibles, MS, RD, LDN [place] [facility] Pager: [phone] After Hours Pager: [phone]

## 2018-06-11 NOTE — Progress Notes (Signed)
PROGRESS NOTE    [name]  HDQ:[id number] DOB: Apr 13, 1960 DOA: 06/10/2018 PCP: Rochel Brome, MD  Outpatient Specialists:   Brief Narrative:  [name] is a 58 year old female with medical history significant for Crohn's disease, hypothyroidism, pancreatic insufficiency, Addison's disease, B12 deficiency, hyperlipidemia, cyclical vomiting syndrome and gastroparesis.  Patient is admitted with nausea, vomiting and diarrhea of 10 days duration. No associated fever, chills or night sweats.  No sick contacts, but patient's husband's colleague was diagnosed with Covid 19/Corona virus.  As per prior documentation, patient was said to have stated that present symptoms were not like her prior Crohn's flare but feel like prior gastroenteritis.  Patient is on Entyvio for Crohn's disease.  Last colonoscopy was in January 2020.    06/11/2018: Patient seen.  Patient reports history of chronic diarrhea.  According to the patient, on the average, she would normally have 8 episodes of bowel movements.  Since onset of current symptoms, patient has had more frequent bowel movements.  Patient had 17 bowel movements yesterday, but has had 7 today.  No vomiting today, but continues to have nausea.  No fever or chills.  No respiratory symptoms.  Patient understands that she is at risk for coronavirus, considering that she is on Entyvio and the husband's colleague currently has coronavirus.  We will continue to monitor closely.   Assessment & Plan:   Active Problems:   Acute dehydration  Acute on chronic diarrhea, with likely gastroenteritis in the setting of Crohn's disease:  Diarrhea persists. Vomiting has resolved.  However, patient continues to have nausea. Continue to manage supportively. GI panel is pending Further management will depend on hospital course. CT abd pelvis unrevealing  Hypokalemia and hypomagnesemia Likely secondary to diarrheal illness. Potassium is 2.6 today. We will  continue to replete. Magnesium is repleted. We will continue to monitor electrolytes and renal function.  Volume depletion:  Continue IV fluids.    Crohn's disease Resume Crohn's medications Follow-up with GI outpatient  Hyperthyroidism Obtain TSH Resume methimazole  Seizure disorder Resume Keppra   DVT prophylaxis: Subcu Lovenox daily Code Status: Full code Family Communication: None at bedside Disposition Plan:  Discharge home eventually.  Consults called: None  Procedures:   None  Antimicrobials:   None   Subjective: Nausea. Diarrhea persists. No fever or chills.  Objective: Vitals:   06/10/18 2040 06/11/18 0540 06/11/18 1043 06/11/18 1336  BP: (!) 157/82 (!) 148/84 (!) 166/97 (!) 169/95  Pulse: 82 74 80 74  Resp: 15 16  16   Temp: 98.6 F (37 C) 98.3 F (36.8 C)    TempSrc: Oral Oral    SpO2: 97% 100%  100%  Weight:      Height:        Intake/Output Summary (Last 24 hours) at 06/11/2018 1445 Last data filed at 06/11/2018 1300 Gross per 24 hour  Intake 288.78 ml  Output -  Net 288.78 ml   Filed Weights   06/10/18 0608  Weight: 82.1 kg    Examination:  General exam: Appears calm and comfortable.  Respiratory system: Clear to auscultation.  Cardiovascular system: S1 & S2 heard. Gastrointestinal system: Abdomen is obese, soft and nontender.  Organs are not palpable.   Central nervous system: Alert and oriented.  Patient moves all extremities.  N Extremities: No leg edema.  Data Reviewed: I have personally reviewed following labs and imaging studies  CBC: Recent Labs  Lab 06/10/18 0625 06/11/18 0445  WBC 10.1 5.1  NEUTROABS 6.5 2.7  HGB 15.1* 12.5  HCT 45.3 38.8  MCV 90.1 91.5  PLT 274 716   Basic Metabolic Panel: Recent Labs  Lab 06/10/18 0625 06/11/18 0445  NA 137 139  K 2.4* 2.6*  CL 101 105  CO2 23 25  GLUCOSE 124* 99  BUN 13 6  CREATININE 0.93 0.70  CALCIUM 9.1 8.4*  MG 1.4* 2.3   GFR: Estimated  Creatinine Clearance: 84.5 mL/min (by C-G formula based on SCr of 0.7 mg/dL). Liver Function Tests: Recent Labs  Lab 06/10/18 0625 06/11/18 0445  AST 22 18  ALT 22 16  ALKPHOS 117 99  BILITOT 0.8 1.0  PROT 8.6* 7.2  ALBUMIN 4.3 3.8   Recent Labs  Lab 06/10/18 0625  LIPASE 37   No results for input(s): AMMONIA in the last 168 hours. Coagulation Profile: No results for input(s): INR, PROTIME in the last 168 hours. Cardiac Enzymes: No results for input(s): CKTOTAL, CKMB, CKMBINDEX, TROPONINI in the last 168 hours. BNP (last 3 results) No results for input(s): PROBNP in the last 8760 hours. HbA1C: No results for input(s): HGBA1C in the last 72 hours. CBG: No results for input(s): GLUCAP in the last 168 hours. Lipid Profile: No results for input(s): CHOL, HDL, LDLCALC, TRIG, CHOLHDL, LDLDIRECT in the last 72 hours. Thyroid Function Tests: Recent Labs    06/11/18 0445  TSH 0.304*   Anemia Panel: No results for input(s): VITAMINB12, FOLATE, FERRITIN, TIBC, IRON, RETICCTPCT in the last 72 hours. Urine analysis:    Component Value Date/Time   COLORURINE YELLOW 06/10/2018 0625   APPEARANCEUR HAZY (A) 06/10/2018 0625   LABSPEC 1.044 (H) 06/10/2018 0625   PHURINE 6.0 06/10/2018 0625   GLUCOSEU NEGATIVE 06/10/2018 0625   HGBUR NEGATIVE 06/10/2018 0625   BILIRUBINUR NEGATIVE 06/10/2018 0625   KETONESUR 5 (A) 06/10/2018 0625   PROTEINUR NEGATIVE 06/10/2018 0625   UROBILINOGEN 0.2 03/29/2013 0919   NITRITE NEGATIVE 06/10/2018 0625   LEUKOCYTESUR NEGATIVE 06/10/2018 0625   Sepsis Labs: @LABRCNTIP (procalcitonin:4,lacticidven:4)  )No results found for this or any previous visit (from the past 240 hour(s)).       Radiology Studies: Ct Abdomen Pelvis W Contrast  Result Date: 06/10/2018 CLINICAL DATA:  Nausea and vomiting EXAM: CT ABDOMEN AND PELVIS WITH CONTRAST TECHNIQUE: Multidetector CT imaging of the abdomen and pelvis was performed using the standard protocol  following bolus administration of intravenous contrast. CONTRAST:  159m OMNIPAQUE IOHEXOL 300 MG/ML  SOLN COMPARISON:  05/02/2018 FINDINGS: Lower chest:  No contributory findings. Hepatobiliary: No focal liver abnormality. Hepatic steatosis. Cholecystectomy. No bile duct dilatation Pancreas: Unremarkable. Spleen: Unremarkable. Adrenals/Urinary Tract: Negative adrenals. No hydronephrosis or stone. Unremarkable bladder. Stomach/Bowel: Right hemicolectomy. Submucosal low-density diffusely in the colon primarily attributed to fat deposition rather than colitis given the homogeneous appearance and lack of associated fat inflammation. Negative for bowel obstruction. Vascular/Lymphatic: No acute vascular abnormality. Atherosclerotic calcification. No mass or adenopathy. Reproductive:Hysterectomy with negative ovaries. Other: No ascites or pneumoperitoneum. Musculoskeletal: Dorsal column stimulator leads. IMPRESSION: 1. Negative for bowel obstruction or convincing inflammation. 2. Hepatic steatosis and atherosclerosis. Electronically Signed   By: JMonte FantasiaM.D.   On: 06/10/2018 09:09        Scheduled Meds: . cholecalciferol  1,000 Units Oral Daily  . cyanocobalamin  1,000 mcg Intramuscular Q30 days  . enoxaparin (LOVENOX) injection  40 mg Subcutaneous Q24H  . gabapentin  1,200 mg Oral QPC supper  . gabapentin  400 mg Oral Q breakfast  . gabapentin  800 mg Oral Q lunch  .  hydrocortisone  5 mg Oral BID  . hydrocortisone-pramoxine  1 applicator Rectal BID  . levETIRAcetam  250 mg Oral BID  . lipase/protease/amylase  36,000 Units Oral TID AC  . methimazole  5 mg Oral Daily  . multivitamin  15 mL Oral Daily  . pantoprazole  40 mg Oral Daily  . propranolol ER  80 mg Oral Daily  . senna-docusate  2 tablet Oral BID   Continuous Infusions: . lactated ringers with KCl/Additives Pediatric custom IV fluid 100 mL/hr at 06/11/18 0453  . potassium chloride 10 mEq (06/11/18 1438)     LOS: 0 days     Time spent: 49 Minutes    Dana Allan, MD  [facility] Pager #: [phone] 7PM-7AM contact night coverage as above

## 2018-06-11 NOTE — Progress Notes (Signed)
CRITICAL VALUE ALERT  Critical Value: K+- 2.6  Date & Time Notied: 06/11/18 5749  Provider Notified: X. Blount  Orders Received/Actions taken:

## 2018-06-11 NOTE — Evaluation (Signed)
Physical Therapy Evaluation Patient Details Name: [name] MRN: [id number] DOB: 19-Dec-1960 Today's Date: 06/11/2018   History of Present Illness  58 y.o. female with medical history significant for Crohn's disease, hypothyroidism, pancreatic insufficiency, Addison's disease, B12 deficiency, hyperlipidemia, cyclical vomiting syndrome, and gastroparesis and admitted for N/V/D  Clinical Impression  Patient evaluated by Physical Therapy with no further acute PT needs identified. All education has been completed and the patient has no further questions.  See below for any follow-up Physical Therapy or equipment needs. PT is signing off. Thank you for this referral.    Follow Up Recommendations No PT follow up    Equipment Recommendations  None recommended by PT    Recommendations for Other Services       Precautions / Restrictions Precautions Precautions: Fall;None      Mobility  Bed Mobility Overal bed mobility: Modified Independent                Transfers Overall transfer level: Modified independent                  Ambulation/Gait Ambulation/Gait assistance: Supervision;Modified independent (Device/Increase time) Gait Distance (Feet): 400 Feet Assistive device: Straight cane Gait Pattern/deviations: Step-through pattern     General Gait Details: short steps, stable with SPC, no unsteadiness or LOB observed  Stairs            Wheelchair Mobility    Modified Rankin (Stroke Patients Only)       Balance Overall balance assessment: (denies any falls)                                           Pertinent Vitals/Pain Pain Assessment: No/denies pain    Home Living Family/patient expects to be discharged to:: Private residence Living Arrangements: Spouse/significant other   Type of Home: House       Home Layout: Able to live on main level with bedroom/bathroom Home Equipment: Cane - single point      Prior Function  Level of Independence: Independent with assistive device(s)         Comments: mostly stays in home, family does shopping     Hand Dominance        Extremity/Trunk Assessment   Upper Extremity Assessment Upper Extremity Assessment: Overall WFL for tasks assessed    Lower Extremity Assessment Lower Extremity Assessment: Overall WFL for tasks assessed    Cervical / Trunk Assessment Cervical / Trunk Assessment: Normal  Communication   Communication: No difficulties  Cognition Arousal/Alertness: Awake/alert Behavior During Therapy: WFL for tasks assessed/performed Overall Cognitive Status: Within Functional Limits for tasks assessed                                        General Comments      Exercises     Assessment/Plan    PT Assessment Patent does not need any further PT services  PT Problem List         PT Treatment Interventions      PT Goals (Current goals can be found in the Care Plan section)  Acute Rehab PT Goals PT Goal Formulation: All assessment and education complete, DC therapy    Frequency     Barriers to discharge        Co-evaluation  AM-PAC PT "6 Clicks" Mobility  Outcome Measure Help needed turning from your back to your side while in a flat bed without using bedrails?: None Help needed moving from lying on your back to sitting on the side of a flat bed without using bedrails?: None Help needed moving to and from a bed to a chair (including a wheelchair)?: None Help needed standing up from a chair using your arms (e.g., wheelchair or bedside chair)?: None Help needed to walk in hospital room?: None Help needed climbing 3-5 steps with a railing? : A Little 6 Click Score: 23    End of Session   Activity Tolerance: Patient tolerated treatment well Patient left: in bed;with call bell/phone within reach   PT Visit Diagnosis: Difficulty in walking, not elsewhere classified (R26.2)    Time:  1017-5102 PT Time Calculation (min) (ACUTE ONLY): 15 min   Charges:   PT Evaluation $PT Eval Low Complexity: [place], PT, DPT Acute Rehabilitation Services Office: [phone] Pager: [phone]  Trena Platt 06/11/2018, 3:06 PM

## 2018-06-12 LAB — GASTROINTESTINAL PANEL BY PCR, STOOL (REPLACES STOOL CULTURE)

## 2018-06-12 LAB — RENAL FUNCTION PANEL
Albumin: 3.5 g/dL (ref 3.5–5.0)
Anion gap: 4 — ABNORMAL LOW (ref 5–15)
BUN: <5 mg/dL — ABNORMAL LOW (ref 6–20)
CO2: 25 mmol/L (ref 22–32)
Calcium: 8.5 mg/dL — ABNORMAL LOW (ref 8.9–10.3)
Chloride: 112 mmol/L — ABNORMAL HIGH (ref 98–111)
Creatinine, Ser: 0.67 mg/dL (ref 0.44–1.00)
GFR calc Af Amer: >60 mL/min (ref >60)
GFR calc non Af Amer: >60 mL/min (ref >60)
Glucose, Bld: 103 mg/dL — ABNORMAL HIGH (ref 70–99)
Phosphorus: 2.6 mg/dL (ref 2.5–4.6)
Potassium: 3.5 mmol/L (ref 3.5–5.1)
Sodium: 141 mmol/L (ref 135–145)

## 2018-06-12 LAB — MAGNESIUM: Magnesium: 1.8 mg/dL (ref 1.7–2.4)

## 2018-06-12 MED ORDER — MAGNESIUM SULFATE IN D5W 1-5 GM/100ML-% IV SOLN
1.0000 g | Freq: Once | INTRAVENOUS | Status: AC
  Administered 2018-06-12: 1 g via INTRAVENOUS
  Filled 2018-06-12: qty 100

## 2018-06-12 MED ORDER — POTASSIUM CHLORIDE CRYS ER 20 MEQ PO TBCR
40.0000 meq | EXTENDED_RELEASE_TABLET | ORAL | Status: AC
  Administered 2018-06-12 (×2): 40 meq via ORAL
  Filled 2018-06-12 (×2): qty 2

## 2018-06-12 MED ORDER — ADULT MULTIVITAMIN W/MINERALS CH
1.0000 | ORAL_TABLET | Freq: Every day | ORAL | Status: DC
  Administered 2018-06-12 – 2018-06-13 (×2): 1 via ORAL
  Filled 2018-06-12 (×2): qty 1

## 2018-06-12 NOTE — Progress Notes (Signed)
PROGRESS NOTE    [name]  HDQ:[id number] DOB: November 09, 1960 DOA: 06/10/2018 PCP: Rochel Brome, MD  Outpatient Specialists:   Brief Narrative:  IVIANA [name] is a 58 year old female with medical history significant for Crohn's disease, hypothyroidism, pancreatic insufficiency, Addison's disease, B12 deficiency, hyperlipidemia, cyclical vomiting syndrome and gastroparesis.  Patient is admitted with nausea, vomiting and diarrhea of 10 days duration. No associated fever, chills or night sweats.  No sick contacts, but patient's husband's colleague was diagnosed with Covid 19/Corona virus.  As per prior documentation, patient was said to have stated that present symptoms were not like her prior Crohn's flare but feel like prior gastroenteritis.  Patient is on Entyvio for Crohn's disease.  Last colonoscopy was in January 2020.    06/11/2018: Patient seen.  Patient reports history of chronic diarrhea.  According to the patient, on the average, she would normally have 8 episodes of bowel movements.  Since onset of current symptoms, patient has had more frequent bowel movements.  Patient had 17 bowel movements yesterday, but has had 7 today.  No vomiting today, but continues to have nausea.  No fever or chills.  No respiratory symptoms.  Patient understands that she is at risk for coronavirus, considering that she is on Entyvio and the husband's colleague currently has coronavirus.  We will continue to monitor closely.  06/12/2018: Patient seen.  No fever or chills.  No respiratory symptoms.  According to the patient, she had 11 episodes of diarrhea stools yesterday, and she has had 8 so far for today.  Will advance diet as tolerated, and continue current management..  Assessment & Plan:   Active Problems:   Acute dehydration   Gastroenteritis  Acute on chronic diarrhea, with likely gastroenteritis in the setting of Crohn's disease:  Diarrhea persists. Vomiting has resolved.  However, patient  continues to have nausea. Continue to manage supportively. GI panel is pending Further management will depend on hospital course. CT abd pelvis unrevealing 06/12/2018: Continue current management.  Advance diet as tolerated.  Hypokalemia and hypomagnesemia Likely secondary to diarrheal illness. Potassium is 2.6 today. We will continue to replete. Magnesium is repleted. We will continue to monitor electrolytes and renal function. 06/12/2018: Potassium is improving.  Potassium is 3.5 today.  Magnesium is 1.8.  We will continue to monitor and replete.  Volume depletion:  Continue IV fluids.   Resolved significantly.  Crohn's disease Resume Crohn's medications Follow-up with GI outpatient  Hyperthyroidism Obtain TSH Resume methimazole  Seizure disorder Resume Keppra   DVT prophylaxis: Subcu Lovenox daily Code Status: Full code Family Communication: None at bedside Disposition Plan:  Discharge home eventually.  Consults called: None  Procedures:   None  Antimicrobials:   None   Subjective: Diarrhea persists, but seems to be improving.. No fever or chills.  Objective: Vitals:   06/11/18 1336 06/11/18 2035 06/12/18 0514 06/12/18 1114  BP: (!) 169/95 (!) 137/99 (!) 153/97   Pulse: 74 (!) 58 (!) 57 62  Resp: 16 16 17    Temp:  98.1 F (36.7 C) 98.2 F (36.8 C)   TempSrc:  Oral Oral   SpO2: 100% 98% 98%   Weight:      Height:        Intake/Output Summary (Last 24 hours) at 06/12/2018 1426 Last data filed at 06/11/2018 1928 Gross per 24 hour  Intake 2039.92 ml  Output 5 ml  Net 2034.92 ml   Filed Weights   06/10/18 0608  Weight: 82.1 kg  Examination:  General exam: Appears calm and comfortable.  Respiratory system: Clear to auscultation.  Cardiovascular system: S1 & S2 heard. Gastrointestinal system: Abdomen is obese, soft and nontender.  Organs are not palpable.   Central nervous system: Alert and oriented.  Patient moves all extremities.  N  Extremities: No leg edema.  Data Reviewed: I have personally reviewed following labs and imaging studies  CBC: Recent Labs  Lab 06/10/18 0625 06/11/18 0445  WBC 10.1 5.1  NEUTROABS 6.5 2.7  HGB 15.1* 12.5  HCT 45.3 38.8  MCV 90.1 91.5  PLT 274 675   Basic Metabolic Panel: Recent Labs  Lab 06/10/18 0625 06/11/18 0445 06/12/18 1035  NA 137 139 141  K 2.4* 2.6* 3.5  CL 101 105 112*  CO2 23 25 25   GLUCOSE 124* 99 103*  BUN 13 6 <5*  CREATININE 0.93 0.70 0.67  CALCIUM 9.1 8.4* 8.5*  MG 1.4* 2.3 1.8  PHOS  --   --  2.6   GFR: Estimated Creatinine Clearance: 84.5 mL/min (by C-G formula based on SCr of 0.67 mg/dL). Liver Function Tests: Recent Labs  Lab 06/10/18 0625 06/11/18 0445 06/12/18 1035  AST 22 18  --   ALT 22 16  --   ALKPHOS 117 99  --   BILITOT 0.8 1.0  --   PROT 8.6* 7.2  --   ALBUMIN 4.3 3.8 3.5   Recent Labs  Lab 06/10/18 0625  LIPASE 37   No results for input(s): AMMONIA in the last 168 hours. Coagulation Profile: No results for input(s): INR, PROTIME in the last 168 hours. Cardiac Enzymes: No results for input(s): CKTOTAL, CKMB, CKMBINDEX, TROPONINI in the last 168 hours. BNP (last 3 results) No results for input(s): PROBNP in the last 8760 hours. HbA1C: No results for input(s): HGBA1C in the last 72 hours. CBG: No results for input(s): GLUCAP in the last 168 hours. Lipid Profile: No results for input(s): CHOL, HDL, LDLCALC, TRIG, CHOLHDL, LDLDIRECT in the last 72 hours. Thyroid Function Tests: Recent Labs    06/11/18 0445  TSH 0.304*   Anemia Panel: No results for input(s): VITAMINB12, FOLATE, FERRITIN, TIBC, IRON, RETICCTPCT in the last 72 hours. Urine analysis:    Component Value Date/Time   COLORURINE YELLOW 06/10/2018 0625   APPEARANCEUR HAZY (A) 06/10/2018 0625   LABSPEC 1.044 (H) 06/10/2018 0625   PHURINE 6.0 06/10/2018 0625   GLUCOSEU NEGATIVE 06/10/2018 0625   HGBUR NEGATIVE 06/10/2018 0625   BILIRUBINUR NEGATIVE  06/10/2018 0625   KETONESUR 5 (A) 06/10/2018 0625   PROTEINUR NEGATIVE 06/10/2018 0625   UROBILINOGEN 0.2 03/29/2013 0919   NITRITE NEGATIVE 06/10/2018 0625   LEUKOCYTESUR NEGATIVE 06/10/2018 0625   Sepsis Labs: @LABRCNTIP (procalcitonin:4,lacticidven:4)  )No results found for this or any previous visit (from the past 240 hour(s)).       Radiology Studies: No results found.      Scheduled Meds: . cholecalciferol  1,000 Units Oral Daily  . cyanocobalamin  1,000 mcg Intramuscular Q30 days  . enoxaparin (LOVENOX) injection  40 mg Subcutaneous Q24H  . gabapentin  1,200 mg Oral QPC supper  . gabapentin  400 mg Oral Q breakfast  . gabapentin  800 mg Oral Q lunch  . hydrocortisone  5 mg Oral BID  . hydrocortisone-pramoxine  1 applicator Rectal BID  . levETIRAcetam  250 mg Oral BID  . lipase/protease/amylase  36,000 Units Oral TID AC  . methimazole  5 mg Oral Daily  . multivitamin with minerals  1 tablet Oral  Daily  . pantoprazole  40 mg Oral Daily  . propranolol ER  80 mg Oral Daily  . senna-docusate  2 tablet Oral BID   Continuous Infusions: . lactated ringers with KCl/Additives Pediatric custom IV fluid 100 mL/hr at 06/12/18 1327     LOS: 1 day    Time spent: [place]    Dana Allan, MD  [facility] Pager #: [phone] 7PM-7AM contact night coverage as above

## 2018-06-13 LAB — RENAL FUNCTION PANEL
Albumin: 3.3 g/dL — ABNORMAL LOW (ref 3.5–5.0)
Anion gap: 5 (ref 5–15)
BUN: <5 mg/dL — ABNORMAL LOW (ref 6–20)
CO2: 23 mmol/L (ref 22–32)
Calcium: 8.6 mg/dL — ABNORMAL LOW (ref 8.9–10.3)
Chloride: 113 mmol/L — ABNORMAL HIGH (ref 98–111)
Creatinine, Ser: 0.74 mg/dL (ref 0.44–1.00)
GFR calc Af Amer: >60 mL/min (ref >60)
GFR calc non Af Amer: >60 mL/min (ref >60)
Glucose, Bld: 88 mg/dL (ref 70–99)
Phosphorus: 3.8 mg/dL (ref 2.5–4.6)
Potassium: 4.4 mmol/L (ref 3.5–5.1)
Sodium: 141 mmol/L (ref 135–145)

## 2018-06-13 MED ORDER — POTASSIUM CHLORIDE 2 MEQ/ML IV SOLN
INTRAVENOUS | Status: DC
  Filled 2018-06-13: qty 1000

## 2018-06-13 MED ORDER — ADULT MULTIVITAMIN W/MINERALS CH: 1.0000 | ORAL_TABLET | Freq: Every day | ORAL | 0 refills | Status: AC

## 2018-06-13 NOTE — Discharge Summary (Signed)
Physician Discharge Summary  Patient ID: [name] MRN: [id number] DOB/AGE: 59-Sep-1962 59 y.o.  Admit date: 06/10/2018 Discharge date: 06/13/2018  Admission Diagnoses:  Discharge Diagnoses:  Active Problems:   Gastroenteritis   Hypokalemia   Volume depletion   Crohn's disease  Discharged Condition: stable  Hospital Course:  Jessica Azizi Montgomeryis a 58 year old Caucasian female, with past medical history significant for Crohn's disease on Entyvio, hypothyroidism, pancreatic insufficiency, Addison's disease, B12 deficiency, hyperlipidemia, chronic baseline diarrhea, cyclical vomiting syndrome and gastroparesis.  Patient was admitted with nausea, vomiting and diarrhea of 10 days' duration.  No sick contacts, but patient's husband's colleague was diagnosed with Covid 19/Corona virus.  Patient reported that her current GI symptoms when not typical of Crohn's disease flare.  Significantly, patient's potassium on presentation was 2.4 and magnesium was 1.4.  Patient was admitted for further assessment and management.  Patient was managed supportively.  Potassium was repleted.  Magnesium was also repleted.  Patient was volume resuscitated.  Patient has improved significantly.  Diarrhea has improved.  Nausea and vomiting have resolved.  Patient will be discharged back on to the care of the primary care provider and GI team.  Acute on chronic diarrhea, with likely gastroenteritis in the setting of Crohn's disease:  Patient was managed supportively.  Patient was volume resuscitated. Abnormal electrolytes were corrected. CT Mannam pelvis did not reveal any significant finding.  Patient is back to her baseline.  Hypokalemia and hypomagnesemia Likely secondary to diarrheal illness. Potassium was 2.4 on admission, and 4.4 prior to discharge.   Magnesium was 1.4 on admission, and 1.8 prior to discharge.   PCP will currently continue to monitor renal function and electrolytes.    Volume  depletion:  Patient was volume resuscitated with IV fluids.    Crohn's disease Resumed Crohn's medications Follow-up with GI outpatient  Hyperthyroidism Resumed methimazole  Seizure disorder Resumed Keppra  Consults: None  Significant Diagnostic Studies:   Discharge Exam: Blood pressure (!) 158/106, pulse (!) 58, temperature 99.1 F (37.3 C), temperature source Oral, resp. rate 16, height 5' 7"  (1.702 m), weight 82.1 kg, SpO2 99 %.  Disposition: Discharge disposition: 01-Home or Self Care   Discharge Instructions    Diet - low sodium heart healthy   Complete by:  As directed    Increase activity slowly   Complete by:  As directed      Allergies as of 06/13/2018      Reactions   Codeine Hives, Nausea And Vomiting   Amitriptyline Itching, Hives   Humira [adalimumab] Rash      Medication List    STOP taking these medications   clobetasol cream 0.05 % Commonly known as:  TEMOVATE   hydrocortisone 2.5 % rectal cream Commonly known as:  ANUSOL-HC   prochlorperazine 10 MG tablet Commonly known as:  COMPAZINE   promethazine 12.5 MG tablet Commonly known as:  PHENERGAN   promethazine 25 MG tablet Commonly known as:  PHENERGAN   RABEprazole 20 MG tablet Commonly known as:  ACIPHEX   tiZANidine 4 MG tablet Commonly known as:  ZANAFLEX     TAKE these medications   colestipol 1 g tablet Commonly known as:  COLESTID Take 1 tablet (1 g total) by mouth 3 (three) times daily.   cyanocobalamin 1000 MCG/ML injection Commonly known as:  (VITAMIN B-12) Inject 77m SQ once every 7 days x 4 then inject 1 ml SQ every 28 days. What changed:    how much to take  how to take  this  when to take this  additional instructions   dronabinol 2.5 MG capsule Commonly known as:  MARINOL Take 2.5 mg by mouth 2 (two) times daily.   econazole nitrate 1 % cream APPLY  CREAM TOPICALLY ONCE DAILY What changed:  See the new instructions.   gabapentin 400 MG  capsule Commonly known as:  NEURONTIN Take 400-1,200 mg by mouth 3 (three) times daily. Take 1 capsule by mouth at breakfast, 2 at lunch and 3 at dinner   hydrocortisone 5 MG tablet Commonly known as:  CORTEF Take 5 mg by mouth 2 (two) times daily.   hyoscyamine 0.125 MG SL tablet Commonly known as:  LEVSIN SL Place 0.125 mg under the tongue 4 (four) times daily.   levETIRAcetam 250 MG tablet Commonly known as:  KEPPRA Take 250 mg by mouth 2 (two) times daily.   lipase/protease/amylase 36000 UNITS Cpep capsule Commonly known as:  CREON Take 2  ( 72,000units) by mouth three times a day with meals one (36,000units) with snacks This is a dose increase for this -patient   methimazole 5 MG tablet Commonly known as:  TAPAZOLE Take 5 mg by mouth daily.   multivitamin with minerals Tabs tablet Take 1 tablet by mouth daily. Start taking on:  June 14, 2018   Needles & Syringes Misc 1 Syringe by Does not apply route every 30 (thirty) days.   omeprazole 40 MG capsule Commonly known as:  PRILOSEC Take 1 capsule (40 mg total) by mouth daily.   Oxycodone HCl 10 MG Tabs Take 10 mg by mouth 5 (five) times daily as needed (pain). 1 tablet up to five times a day as needed for severe pain.   pramoxine-hydrocortisone 1-1 % rectal cream Commonly known as:  PROCTOCREAM-HC Place 1 application rectally 2 (two) times daily.   ProAir HFA 108 (90 Base) MCG/ACT inhaler Generic drug:  albuterol Inhale 2 puffs into the lungs every 4 (four) hours as needed for wheezing or shortness of breath.   propranolol ER 80 MG 24 hr capsule Commonly known as:  INDERAL LA Take 1 capsule by mouth daily.   traZODone 50 MG tablet Commonly known as:  DESYREL Take 50 mg by mouth at bedtime as needed for sleep.   vedolizumab 300 MG injection Commonly known as:  ENTYVIO Inject 300 mg into the vein every 8 (eight) weeks.   VITAMIN D PO Take 1 tablet by mouth daily.   zolmitriptan 5 MG nasal  solution Commonly known as:  Zomig 1 spray in one nostril.  May repeat 1 spray once after 2 hours if needed. Not to exceed 2 sprays in 24 hours What changed:    how much to take  how to take this  when to take this  reasons to take this        Signed: Bonnell Public 06/13/2018, 11:45 AM

## 2018-06-14 DIAGNOSIS — M5137 Other intervertebral disc degeneration, lumbosacral region: Secondary | ICD-10-CM | POA: Diagnosis not present

## 2018-06-14 DIAGNOSIS — G629 Polyneuropathy, unspecified: Secondary | ICD-10-CM | POA: Diagnosis not present

## 2018-06-14 DIAGNOSIS — M7060 Trochanteric bursitis, unspecified hip: Secondary | ICD-10-CM | POA: Diagnosis not present

## 2018-06-14 DIAGNOSIS — M47817 Spondylosis without myelopathy or radiculopathy, lumbosacral region: Secondary | ICD-10-CM | POA: Diagnosis not present

## 2018-07-04 ENCOUNTER — Other Ambulatory Visit: Payer: Self-pay | Admitting: Gastroenterology

## 2018-07-06 ENCOUNTER — Telehealth: Payer: Self-pay | Admitting: Gastroenterology

## 2018-07-06 MED ORDER — PROMETHAZINE HCL 25 MG PO TABS: 25.0000 mg | ORAL_TABLET | Freq: Every day | ORAL | 0 refills | Status: DC | PRN

## 2018-07-06 NOTE — Telephone Encounter (Signed)
Prescription sent to patient's pharmacy and patient notified. Scheduled follow up visit for 07/12/18 at 1:30pm.

## 2018-07-06 NOTE — Telephone Encounter (Signed)
Ok to send refill for Promethazine 69m daily prn X 90  with no refills. Schedule follow up tele visit next available.

## 2018-07-06 NOTE — Telephone Encounter (Signed)
Dr. Silverio Decamp, patient is requesting a refill of promethazine. We have not refilled since 09/2017. Can we refill and if so then what dose and refills?

## 2018-07-06 NOTE — Telephone Encounter (Signed)
Patient would like a refill for Promethazine. But I did not fine the med in her medication list.

## 2018-07-12 ENCOUNTER — Other Ambulatory Visit: Payer: Self-pay

## 2018-07-12 ENCOUNTER — Ambulatory Visit (INDEPENDENT_AMBULATORY_CARE_PROVIDER_SITE_OTHER): Payer: BLUE CROSS/BLUE SHIELD | Admitting: Gastroenterology

## 2018-07-12 ENCOUNTER — Encounter: Payer: Self-pay | Admitting: Gastroenterology

## 2018-07-12 VITALS — Ht 67.0 in | Wt 181.0 lb

## 2018-07-12 DIAGNOSIS — K5000 Crohn's disease of small intestine without complications: Secondary | ICD-10-CM | POA: Diagnosis not present

## 2018-07-12 DIAGNOSIS — K588 Other irritable bowel syndrome: Secondary | ICD-10-CM

## 2018-07-12 DIAGNOSIS — R197 Diarrhea, unspecified: Secondary | ICD-10-CM

## 2018-07-12 DIAGNOSIS — R112 Nausea with vomiting, unspecified: Secondary | ICD-10-CM | POA: Diagnosis not present

## 2018-07-12 DIAGNOSIS — M7060 Trochanteric bursitis, unspecified hip: Secondary | ICD-10-CM | POA: Diagnosis not present

## 2018-07-12 DIAGNOSIS — M47817 Spondylosis without myelopathy or radiculopathy, lumbosacral region: Secondary | ICD-10-CM | POA: Diagnosis not present

## 2018-07-12 DIAGNOSIS — M25462 Effusion, left knee: Secondary | ICD-10-CM | POA: Diagnosis not present

## 2018-07-12 DIAGNOSIS — K8689 Other specified diseases of pancreas: Secondary | ICD-10-CM | POA: Diagnosis not present

## 2018-07-12 DIAGNOSIS — G629 Polyneuropathy, unspecified: Secondary | ICD-10-CM | POA: Diagnosis not present

## 2018-07-12 MED ORDER — PANCRELIPASE (LIP-PROT-AMYL) 36000-114000 UNITS PO CPEP: ORAL_CAPSULE | ORAL | 11 refills | Status: AC

## 2018-07-12 MED ORDER — PROMETHAZINE HCL 25 MG RE SUPP: 25.0000 mg | Freq: Four times a day (QID) | RECTAL | 0 refills | Status: DC | PRN

## 2018-07-12 NOTE — Progress Notes (Signed)
[name]    [id number]    1960/10/13  Primary Care Physician:Cox, Elnita Maxwell, MD  Referring Physician: Rochel Brome, MD [address] [facility] [place], [place] [address]  This service was provided via audio and video telemedicine (Doximity) due to [place] 19 pandemic.  Patient location: Home Provider location: Office Used 2 patient identifiers to confirm the correct person. Explained the limitations in evaluation and management via telemedicine. Patient is aware of potential medical charges for this visit.  Patient consented to this virtual visit.  The persons participating in this telemedicine service were myself and the patient   Chief complaint:  Nausea and vomiting  HPI:  17 yr F with h/o Crohn's disease on Entyvio, Addison's disease, pancreatic insufficiency, gastroparesis, chronic narcotic use, chronic dairrhea and cyclic vomiting. Recently hospitalized last month 06/10/18 to 06/13/18 with worsening diarrhea, nausea and vomiting, severe hypokalemia and hypomagnesemia  She continues to have nausea daily and intermittent vomiting. She is taking Creon and Colestid but still has 4-6 bowel movements daily on a good day.   She was referred to Dr Derrill Kay [facility]). She was started on Citrucel, her symptoms were thought to be predominantly IBS related. Also plan to repeat colonosocpy with biopsies and MR enterography   CT abd & pelvis with contrast 04/25/18 Fluid filled colon extending to the level of the mid descending colon without frank pericolonic inflammatory changes. No CT evidence for active disease but mild early active disease can be occult on imaging. Submucosal fat deposition involving most of the colon likely sequela of chronic inflammatory bowel disease. Other ancillary findings as above.   Outpatient Encounter Medications as of 07/12/2018  Medication Sig  . albuterol (PROAIR HFA) 108 (90 BASE) MCG/ACT inhaler Inhale 2 puffs into the lungs every 4  (four) hours as needed for wheezing or shortness of breath.   . colestipol (COLESTID) 1 g tablet Take 1 tablet (1 g total) by mouth 3 (three) times daily.  . cyanocobalamin (,VITAMIN B-12,) 1000 MCG/ML injection Inject 78m SQ once every 7 days x 4 then inject 1 ml SQ every 28 days. (Patient taking differently: Inject 1,000 mcg into the muscle every 30 (thirty) days. )  . dronabinol (MARINOL) 2.5 MG capsule Take 2.5 mg by mouth 2 (two) times daily.  .Marland Kitcheneconazole nitrate 1 % cream APPLY  CREAM TOPICALLY ONCE DAILY (Patient taking differently: Apply 1 application topically daily. )  . gabapentin (NEURONTIN) 400 MG capsule Take 400-1,200 mg by mouth 3 (three) times daily. Take 1 capsule by mouth at breakfast, 2 at lunch and 3 at dinner  . hydrocortisone (CORTEF) 5 MG tablet Take 5 mg by mouth 2 (two) times daily.  . hyoscyamine (LEVSIN SL) 0.125 MG SL tablet Place 0.125 mg under the tongue 4 (four) times daily.  .Marland KitchenlevETIRAcetam (KEPPRA) 250 MG tablet Take 250 mg by mouth 2 (two) times daily.  . lipase/protease/amylase (CREON) 36000 UNITS CPEP capsule Take 2  ( 72,000units) by mouth three times a day with meals one (36,000units) with snacks This is a dose increase for this -patient  . methimazole (TAPAZOLE) 5 MG tablet Take 5 mg by mouth daily.  . Multiple Vitamin (MULTIVITAMIN WITH MINERALS) TABS tablet Take 1 tablet by mouth daily.  . Needles & Syringes MISC 1 Syringe by Does not apply route every 30 (thirty) days.  .Marland Kitchenomeprazole (PRILOSEC) 40 MG capsule Take 1 capsule (40 mg total) by mouth daily.  . Oxycodone  HCl 10 MG TABS Take 10 mg by mouth 5 (five) times daily as needed (pain). 1 tablet up to five times a day as needed for severe pain.   . pramoxine-hydrocortisone (PROCTOCREAM-HC) 1-1 % rectal cream Place 1 application rectally 2 (two) times daily.  . promethazine (PHENERGAN) 25 MG tablet Take 1 tablet (25 mg total) by mouth daily as needed for nausea or vomiting.  . propranolol ER (INDERAL LA) 80  MG 24 hr capsule Take 1 capsule by mouth daily.  . traZODone (DESYREL) 50 MG tablet Take 50 mg by mouth at bedtime as needed for sleep.   . vedolizumab (ENTYVIO) 300 MG injection Inject 300 mg into the vein every 8 (eight) weeks.  Marland Kitchen VITAMIN D PO Take 1 tablet by mouth daily.  Marland Kitchen zolmitriptan (ZOMIG) 5 MG nasal solution 1 spray in one nostril.  May repeat 1 spray once after 2 hours if needed. Not to exceed 2 sprays in 24 hours (Patient taking differently: Place 1 spray into the nose daily as needed for migraine. 1 spray in one nostril.  May repeat 1 spray once after 2 hours if needed. Not to exceed 2 sprays in 24 hours)   No facility-administered encounter medications on file as of 07/12/2018.     Allergies as of 07/12/2018 - Review Complete 07/12/2018  Allergen Reaction Noted  . Codeine Hives and Nausea And Vomiting 08/31/2007  . Amitriptyline Itching and Hives 01/19/2018  . Humira [adalimumab] Rash 12/24/2013    Past Medical History:  Diagnosis Date  . Arthritis   . Chronic diarrhea   . Chronic disease anemia   . Crohn's disease ([place])   . Depression   . Family history of malignant neoplasm of gastrointestinal tract   . GERD (gastroesophageal reflux disease)   . H/O hiatal hernia   . History of adenomatous polyp of colon   . History of gastritis    AND HX ILEITIS  . History of kidney stones   . History of small bowel obstruction    SECONDARY CROHN'S STRICTURE  S/P COLECTOMY  . Hyperlipidemia   . Hypertension   . Hypopotassemia   . Internal hemorrhoids   . Kidney stones   . Pancreatic insufficiency   . PONV (postoperative nausea and vomiting)   . Psoriasis    SKIN  . Right ureteral stone   . Rotavirus infection 05/31/2013  . S/P dilatation of esophageal stricture   . Seasonal asthma   . Urgency of urination   . Vitamin B12 deficiency     Past Surgical History:  Procedure Laterality Date  . ANAL FISSURE REPAIR  1990's  . CARPAL TUNNEL RELEASE Right 2000  .  CHOLECYSTECTOMY  2002  . CYSTOSCOPY WITH RETROGRADE PYELOGRAM, URETEROSCOPY AND STENT PLACEMENT Left 03/08/2013   Procedure: CYSTOSCOPY WITH RETROGRADE PYELOGRAM, URETEROSCOPY AND STENT PLACEMENT stone basketing;  Surgeon: Alexis Frock, MD;  Location: [facility];  Service: Urology;  Laterality: Left;  . CYSTOSCOPY WITH RETROGRADE PYELOGRAM, URETEROSCOPY AND STENT PLACEMENT Right 04/03/2013   Procedure: CYSTOSCOPY WITH RETROGRADE PYELOGRAM, URETEROSCOPY AND STENT PLACEMENT;  Surgeon: Alexis Frock, MD;  [facility];  Service: Urology;  Laterality: Right;  . DX LAPAROSCOPY CONVERTED TO OPEN RIGHT COLECCTOMY  09-15-2010   ANASTOMOTIC STRICTURE  . LAPAROSCOPIC ILEOCECECTOMY  10-09-2008   AND APPENDECTOMY (TERMINAL ILEITIS & PARTIAL SBO)  . SPINAL CORD STIMULATOR IMPLANT Left 05/2017  . VAGINAL HYSTERECTOMY  1992    Family History  Problem Relation Age of Onset  . Colon cancer  Mother   . Colon polyps Mother   . Colon polyps Father   . Lung cancer Father   . Breast cancer Paternal Grandmother   . Colon polyps Brother   . Thyroid disease Neg Hx   . Stomach cancer Neg Hx   . Pancreatic cancer Neg Hx     Social History   Socioeconomic History  . Marital status: Married    Spouse name: Not on file  . Number of children: 2  . Years of education: Not on file  . Highest education level: Not on file  Occupational History  . Occupation: unemployed    Fish farm manager: [employer]  Social Needs  . Financial resource strain: Not on file  . Food insecurity:    Worry: Not on file    Inability: Not on file  . Transportation needs:    Medical: Not on file    Non-medical: Not on file  Tobacco Use  . Smoking status: Former Smoker    Packs/day: 1.00    Years: 10.00    Pack years: 10.00    Types: Cigarettes    Last attempt to quit: 03/07/1984    Years since quitting: 34.3  . Smokeless tobacco: Never Used  Substance and Sexual Activity  . Alcohol use: No    Alcohol/week: 0.0  standard drinks  . Drug use: No  . Sexual activity: Not on file  Lifestyle  . Physical activity:    Days per week: Not on file    Minutes per session: Not on file  . Stress: Not on file  Relationships  . Social connections:    Talks on phone: Not on file    Gets together: Not on file    Attends religious service: Not on file    Active member of club or organization: Not on file    Attends meetings of clubs or organizations: Not on file    Relationship status: Not on file  . Intimate partner violence:    Fear of current or ex partner: Not on file    Emotionally abused: Not on file    Physically abused: Not on file    Forced sexual activity: Not on file  Other Topics Concern  . Not on file  Social History Narrative  . Not on file      Review of systems: Review of Systems as per HPI All other systems reviewed and are negative.   Physical Exam: Vitals were not taken and physical exam was not performed during this virtual visit.  Data Reviewed:  Reviewed labs, radiology imaging, old records and pertinent past GI work up   Assessment and Plan/Recommendations:  33 yr F with h/o Crohn's disease on Entyvio, Addison's disease, hyperthyroidism, pancreatic insufficiency, gastroparesis, chronic narcotic use, chronic dairrhea and cyclic vomiting.  Continue Creon 2 capsules with meals and 1 capsule with snacks, please send refill for a year  Phenergan suppository 25 mg daily as needed X30  Increase Colestid to 2 g twice daily, after breakfast and at bedtime  Check BMP, magnesium  Scheduled for repeat colonoscopy with biopsies and MR enterography at [facility] for further evaluation  Follow-up tele visit in 2 weeks    K. Denzil Magnuson , MD   CC: Rochel Brome, MD

## 2018-07-12 NOTE — Patient Instructions (Addendum)
  Continue Creon 2 capsules with meals and 1 capsule with snacks, please send refill for a year  Phenergan suppository 25 mg daily as needed X30  Increase Colestid to 2 g twice daily, after breakfast and at bedtime  Check BMP, magnesium ( come to the lab in our basement at [place])  Follow-up tele visit in 2 weeks  I appreciate the  opportunity to care for you  Thank You   Harl Bowie , MD

## 2018-07-17 ENCOUNTER — Other Ambulatory Visit (INDEPENDENT_AMBULATORY_CARE_PROVIDER_SITE_OTHER): Payer: BLUE CROSS/BLUE SHIELD

## 2018-07-17 DIAGNOSIS — R197 Diarrhea, unspecified: Secondary | ICD-10-CM

## 2018-07-17 DIAGNOSIS — K8689 Other specified diseases of pancreas: Secondary | ICD-10-CM | POA: Diagnosis not present

## 2018-07-17 DIAGNOSIS — K5000 Crohn's disease of small intestine without complications: Secondary | ICD-10-CM | POA: Diagnosis not present

## 2018-07-17 DIAGNOSIS — R112 Nausea with vomiting, unspecified: Secondary | ICD-10-CM

## 2018-07-17 DIAGNOSIS — K588 Other irritable bowel syndrome: Secondary | ICD-10-CM

## 2018-07-17 LAB — BASIC METABOLIC PANEL
BUN: 8 mg/dL (ref 6–23)
CO2: 26 mEq/L (ref 19–32)
Calcium: 9.3 mg/dL (ref 8.4–10.5)
Chloride: 103 mEq/L (ref 96–112)
Creatinine, Ser: 0.81 mg/dL (ref 0.40–1.20)
GFR: 72.36 mL/min (ref >60.00)
Glucose, Bld: 124 mg/dL — ABNORMAL HIGH (ref 70–99)
Potassium: 3.2 mEq/L — ABNORMAL LOW (ref 3.5–5.1)
Sodium: 140 mEq/L (ref 135–145)

## 2018-07-17 LAB — MAGNESIUM: Magnesium: 1.5 mg/dL (ref 1.5–2.5)

## 2018-07-19 ENCOUNTER — Other Ambulatory Visit: Payer: Self-pay

## 2018-07-19 DIAGNOSIS — E876 Hypokalemia: Secondary | ICD-10-CM

## 2018-07-19 MED ORDER — POTASSIUM CHLORIDE ER 20 MEQ PO TBCR: 20.0000 meq | EXTENDED_RELEASE_TABLET | Freq: Two times a day (BID) | ORAL | 0 refills | Status: AC

## 2018-07-20 ENCOUNTER — Ambulatory Visit (INDEPENDENT_AMBULATORY_CARE_PROVIDER_SITE_OTHER): Payer: BLUE CROSS/BLUE SHIELD | Admitting: Gastroenterology

## 2018-07-20 ENCOUNTER — Telehealth: Payer: Self-pay

## 2018-07-20 ENCOUNTER — Other Ambulatory Visit: Payer: Self-pay

## 2018-07-20 VITALS — Ht 67.0 in | Wt 181.0 lb

## 2018-07-20 DIAGNOSIS — R112 Nausea with vomiting, unspecified: Secondary | ICD-10-CM | POA: Diagnosis not present

## 2018-07-20 DIAGNOSIS — R197 Diarrhea, unspecified: Secondary | ICD-10-CM

## 2018-07-20 DIAGNOSIS — K50818 Crohn's disease of both small and large intestine with other complication: Secondary | ICD-10-CM | POA: Diagnosis not present

## 2018-07-20 MED ORDER — PROMETHAZINE HCL 25 MG PO TABS: 25.0000 mg | ORAL_TABLET | Freq: Every day | ORAL | 0 refills | Status: DC | PRN

## 2018-07-20 NOTE — Patient Instructions (Addendum)
Will need to restart Entyvio infusions as soon as possible, last infusion was in January.  Patient missed 2 infusions in between due to her concern/fear regarding coronavirus pandemic.  Schedule Entyvio infusions every 4 weeks  Phenergan suppository daily as needed X30  Repeat BMP next week  Follow-up telemedicine visit in 2 weeks  Thank you for choosing me and [facility].  Dr. Silverio Decamp

## 2018-07-20 NOTE — Telephone Encounter (Signed)
[name],  Please see Dr. Silverio Decamp office visit note from today 07/20/2018. I have already spoken to patient regarding labs. She knows to come in next week.   Will need to restart Entyvio infusions as soon as possible, last infusion was in January.  Patient missed 2 infusions in between due to her concern/fear regarding coronavirus pandemic.

## 2018-07-20 NOTE — Progress Notes (Signed)
[name]    [id number]    27-May-1960  Primary Care Physician:Cox, Elnita Maxwell, MD  Referring Physician: Rochel Brome, MD [address] [place], [place] [address]  This service was provided via audio and video telemedicine (Doximity) due to [place] 19 pandemic.  Patient location: Home Provider location: Office Used 2 patient identifiers to confirm the correct person. Explained the limitations in evaluation and management via telemedicine. Patient is aware of potential medical charges for this visit.  Patient consented to this virtual visit.  The persons participating in this telemedicine service were myself and the patient   Chief complaint: Nausea and vomiting  HPI:  She continues to have persistent nausea and vomiting.  She missed Entyvio infusions X2 as she is worried about potential exposure to COVID.  She is taking Phenergan tablets as needed with no significant improvement.  She is tolerating small meals and is able to maintain hydration.  Denies any blood in stool or melena  Colonoscopy March 13, 2018 at [facility] colon polyp adenoma Right and left colon biopsies showed focal active colitis with minimal architectural distortion negative for granuloma or dysplasia Rectum polyp adenoma removed  Previous HPI Jul 12, 2018  58 yr F with h/o Crohn's disease on Entyvio, Addison's disease, pancreatic insufficiency, gastroparesis, chronic narcotic use, chronic dairrhea and cyclic vomiting. Recently hospitalized last month 06/10/18 to 06/13/18 with worsening diarrhea, nausea and vomiting, severe hypokalemia and hypomagnesemia  She continues to have nausea daily and intermittent vomiting. She is taking Creon and Colestid but still has 4-6 bowel movements daily on a good day.   She was referred to Dr Derrill Kay [facility]). She was started on Citrucel, her symptoms were thought to be predominantly IBS related. Also plan to repeat colonosocpy  with biopsies and MR enterography   CT abd & pelvis with contrast 04/25/18 Fluid filled colon extending to the level of the mid descending colon without frank pericolonic inflammatory changes. No CT evidence for active disease but mild early active disease can be occult on imaging. Submucosal fat deposition involving most of the colon likely sequela of chronic inflammatory bowel disease. Other ancillary findings as above.    Outpatient Encounter Medications as of 07/20/2018  Medication Sig  . albuterol (PROAIR HFA) 108 (90 BASE) MCG/ACT inhaler Inhale 2 puffs into the lungs every 4 (four) hours as needed for wheezing or shortness of breath.   . colestipol (COLESTID) 1 g tablet Take 1 tablet (1 g total) by mouth 3 (three) times daily.  . cyanocobalamin (,VITAMIN B-12,) 1000 MCG/ML injection Inject 73m SQ once every 7 days x 4 then inject 1 ml SQ every 28 days. (Patient taking differently: Inject 1,000 mcg into the muscle every 30 (thirty) days. )  . dronabinol (MARINOL) 2.5 MG capsule Take 2.5 mg by mouth 2 (two) times daily.  .Marland Kitcheneconazole nitrate 1 % cream APPLY  CREAM TOPICALLY ONCE DAILY (Patient taking differently: Apply 1 application topically daily. )  . gabapentin (NEURONTIN) 400 MG capsule Take 400-1,200 mg by mouth 3 (three) times daily. Take 1 capsule by mouth at breakfast, 2 at lunch and 3 at dinner  . hydrocortisone (CORTEF) 5 MG tablet Take 5 mg by mouth 2 (two) times daily.  . hyoscyamine (LEVSIN SL) 0.125 MG SL tablet Place 0.125 mg under the tongue 4 (four) times daily.  .Marland KitchenlevETIRAcetam (KEPPRA) 250 MG tablet Take 250 mg by mouth 2 (two) times daily.  .Marland Kitchen  lipase/protease/amylase (CREON) 36000 UNITS CPEP capsule Take 2  ( 72,000units) by mouth three times a day with meals one (36,000units) with snacks This is a dose increase for this -patient  . methimazole (TAPAZOLE) 5 MG tablet Take 5 mg by mouth daily.  . Multiple Vitamin (MULTIVITAMIN WITH MINERALS) TABS tablet Take 1 tablet by  mouth daily.  . Needles & Syringes MISC 1 Syringe by Does not apply route every 30 (thirty) days.  Marland Kitchen omeprazole (PRILOSEC) 40 MG capsule Take 1 capsule (40 mg total) by mouth daily.  . Oxycodone HCl 10 MG TABS Take 10 mg by mouth 5 (five) times daily as needed (pain). 1 tablet up to five times a day as needed for severe pain.   Marland Kitchen Potassium Chloride ER 20 MEQ TBCR Take 20 mEq by mouth 2 (two) times daily for 2 days.  . pramoxine-hydrocortisone (PROCTOCREAM-HC) 1-1 % rectal cream Place 1 application rectally 2 (two) times daily.  . promethazine (PHENERGAN) 25 MG suppository Place 1 suppository (25 mg total) rectally every 6 (six) hours as needed for nausea or vomiting.  . promethazine (PHENERGAN) 25 MG tablet Take 1 tablet (25 mg total) by mouth daily as needed for nausea or vomiting.  . propranolol ER (INDERAL LA) 80 MG 24 hr capsule Take 1 capsule by mouth daily.  . traZODone (DESYREL) 50 MG tablet Take 50 mg by mouth at bedtime as needed for sleep.   . vedolizumab (ENTYVIO) 300 MG injection Inject 300 mg into the vein every 8 (eight) weeks.  Marland Kitchen VITAMIN D PO Take 1 tablet by mouth daily.  Marland Kitchen zolmitriptan (ZOMIG) 5 MG nasal solution 1 spray in one nostril.  May repeat 1 spray once after 2 hours if needed. Not to exceed 2 sprays in 24 hours (Patient taking differently: Place 1 spray into the nose daily as needed for migraine. 1 spray in one nostril.  May repeat 1 spray once after 2 hours if needed. Not to exceed 2 sprays in 24 hours)   No facility-administered encounter medications on file as of 07/20/2018.     Allergies as of 07/20/2018 - Review Complete 07/12/2018  Allergen Reaction Noted  . Codeine Hives and Nausea And Vomiting 08/31/2007  . Amitriptyline Itching and Hives 01/19/2018  . Humira [adalimumab] Rash 12/24/2013    Past Medical History:  Diagnosis Date  . Arthritis   . Chronic diarrhea   . Chronic disease anemia   . Crohn's disease ([place])   . Depression   . Family history of  malignant neoplasm of gastrointestinal tract   . GERD (gastroesophageal reflux disease)   . H/O hiatal hernia   . History of adenomatous polyp of colon   . History of gastritis    AND HX ILEITIS  . History of kidney stones   . History of small bowel obstruction    SECONDARY CROHN'S STRICTURE  S/P COLECTOMY  . Hyperlipidemia   . Hypertension   . Hypopotassemia   . Internal hemorrhoids   . Kidney stones   . Pancreatic insufficiency   . PONV (postoperative nausea and vomiting)   . Psoriasis    SKIN  . Right ureteral stone   . Rotavirus infection 05/31/2013  . S/P dilatation of esophageal stricture   . Seasonal asthma   . Urgency of urination   . Vitamin B12 deficiency     Past Surgical History:  Procedure Laterality Date  . ANAL FISSURE REPAIR  1990's  . CARPAL TUNNEL RELEASE Right 2000  . CHOLECYSTECTOMY  2002  .  CYSTOSCOPY WITH RETROGRADE PYELOGRAM, URETEROSCOPY AND STENT PLACEMENT Left 03/08/2013   Procedure: CYSTOSCOPY WITH RETROGRADE PYELOGRAM, URETEROSCOPY AND STENT PLACEMENT stone basketing;  Surgeon: Alexis Frock, MD;  Location: [facility];  Service: Urology;  Laterality: Left;  . CYSTOSCOPY WITH RETROGRADE PYELOGRAM, URETEROSCOPY AND STENT PLACEMENT Right 04/03/2013   Procedure: CYSTOSCOPY WITH RETROGRADE PYELOGRAM, URETEROSCOPY AND STENT PLACEMENT;  Surgeon: Alexis Frock, MD;  Location: [facility];  Service: Urology;  Laterality: Right;  . DX LAPAROSCOPY CONVERTED TO OPEN RIGHT COLECCTOMY  09-15-2010   ANASTOMOTIC STRICTURE  . LAPAROSCOPIC ILEOCECECTOMY  10-09-2008   AND APPENDECTOMY (TERMINAL ILEITIS & PARTIAL SBO)  . SPINAL CORD STIMULATOR IMPLANT Left 05/2017  . VAGINAL HYSTERECTOMY  1992    Family History  Problem Relation Age of Onset  . Colon cancer Mother   . Colon polyps Mother   . Colon polyps Father   . Lung cancer Father   . Breast cancer Paternal Grandmother   . Colon polyps Brother   . Thyroid disease Neg Hx   . Stomach cancer Neg Hx    . Pancreatic cancer Neg Hx     Social History   Socioeconomic History  . Marital status: Married    Spouse name: Not on file  . Number of children: 2  . Years of education: Not on file  . Highest education level: Not on file  Occupational History  . Occupation: unemployed    Fish farm manager: [employer]  Social Needs  . Financial resource strain: Not on file  . Food insecurity:    Worry: Not on file    Inability: Not on file  . Transportation needs:    Medical: Not on file    Non-medical: Not on file  Tobacco Use  . Smoking status: Former Smoker    Packs/day: 1.00    Years: 10.00    Pack years: 10.00    Types: Cigarettes    Last attempt to quit: 03/07/1984    Years since quitting: 34.3  . Smokeless tobacco: Never Used  Substance and Sexual Activity  . Alcohol use: No    Alcohol/week: 0.0 standard drinks  . Drug use: No  . Sexual activity: Not on file  Lifestyle  . Physical activity:    Days per week: Not on file    Minutes per session: Not on file  . Stress: Not on file  Relationships  . Social connections:    Talks on phone: Not on file    Gets together: Not on file    Attends religious service: Not on file    Active member of club or organization: Not on file    Attends meetings of clubs or organizations: Not on file    Relationship status: Not on file  . Intimate partner violence:    Fear of current or ex partner: Not on file    Emotionally abused: Not on file    Physically abused: Not on file    Forced sexual activity: Not on file  Other Topics Concern  . Not on file  Social History Narrative  . Not on file      Review of systems: Review of Systems as per HPI All other systems reviewed and are negative.   Physical Exam: Vitals were not taken and physical exam was not performed during this virtual visit.  Data Reviewed:  Reviewed labs, radiology imaging, old records and pertinent past GI work up   Assessment and Plan/Recommendations:   58 year old with h/o crohn's disease, Addison's disease, pancreatic  insufficiency, gastroparesis, chronic narcotic use, cyclic vomiting and irritable bowel syndrome  Discussed with patient the need for her to stay on Entyvio to maintain remission for Crohn's disease, will try to get her in for infusion at the hospital soon  Small frequent meals  Phenergan suppository as needed for severe nausea and vomiting  Maintain adequate hydration, follow-up BMP  Follow-up telemedicine visit in 2 weeks       K. Denzil Magnuson , MD   CC: Rochel Brome, MD

## 2018-07-23 ENCOUNTER — Other Ambulatory Visit: Payer: Self-pay

## 2018-07-23 NOTE — Telephone Encounter (Signed)
Left message for the patient call.

## 2018-07-24 NOTE — Telephone Encounter (Signed)
Left a message for the patient to call me.

## 2018-07-26 ENCOUNTER — Other Ambulatory Visit (INDEPENDENT_AMBULATORY_CARE_PROVIDER_SITE_OTHER): Payer: BLUE CROSS/BLUE SHIELD

## 2018-07-26 DIAGNOSIS — R112 Nausea with vomiting, unspecified: Secondary | ICD-10-CM

## 2018-07-26 DIAGNOSIS — R197 Diarrhea, unspecified: Secondary | ICD-10-CM

## 2018-07-26 DIAGNOSIS — K50818 Crohn's disease of both small and large intestine with other complication: Secondary | ICD-10-CM

## 2018-07-26 LAB — BASIC METABOLIC PANEL
BUN: 8 mg/dL (ref 6–23)
CO2: 30 mEq/L (ref 19–32)
Calcium: 9.1 mg/dL (ref 8.4–10.5)
Chloride: 102 mEq/L (ref 96–112)
Creatinine, Ser: 0.86 mg/dL (ref 0.40–1.20)
GFR: 67.53 mL/min (ref >60.00)
Glucose, Bld: 94 mg/dL (ref 70–99)
Potassium: 3.5 mEq/L (ref 3.5–5.1)
Sodium: 142 mEq/L (ref 135–145)

## 2018-07-31 ENCOUNTER — Encounter: Payer: Self-pay | Admitting: Gastroenterology

## 2018-08-01 NOTE — Telephone Encounter (Signed)
Left a detailed message to call back to discuss her infusions.

## 2018-08-02 ENCOUNTER — Encounter: Payer: Self-pay | Admitting: General Surgery

## 2018-08-03 ENCOUNTER — Other Ambulatory Visit: Payer: Self-pay

## 2018-08-03 ENCOUNTER — Telehealth: Payer: Self-pay

## 2018-08-03 ENCOUNTER — Ambulatory Visit (INDEPENDENT_AMBULATORY_CARE_PROVIDER_SITE_OTHER): Payer: BLUE CROSS/BLUE SHIELD | Admitting: Gastroenterology

## 2018-08-03 VITALS — Ht 67.0 in | Wt 181.0 lb

## 2018-08-03 DIAGNOSIS — K3184 Gastroparesis: Secondary | ICD-10-CM

## 2018-08-03 DIAGNOSIS — R112 Nausea with vomiting, unspecified: Secondary | ICD-10-CM | POA: Diagnosis not present

## 2018-08-03 DIAGNOSIS — K8689 Other specified diseases of pancreas: Secondary | ICD-10-CM

## 2018-08-03 DIAGNOSIS — K50818 Crohn's disease of both small and large intestine with other complication: Secondary | ICD-10-CM | POA: Diagnosis not present

## 2018-08-03 DIAGNOSIS — E0590 Thyrotoxicosis, unspecified without thyrotoxic crisis or storm: Secondary | ICD-10-CM | POA: Diagnosis not present

## 2018-08-03 DIAGNOSIS — R197 Diarrhea, unspecified: Secondary | ICD-10-CM

## 2018-08-03 NOTE — Telephone Encounter (Signed)
Pt scheduled for Entyvio at [facility] 08/21/18@9 :30am, pt aware of appt.

## 2018-08-03 NOTE — Progress Notes (Signed)
[name]    [id number]    [birth year]  Primary Care Physician:Cox, Elnita Maxwell, MD  Referring Physician: Rochel Brome, MD [address] [place], [place] [address]  This service was provided via audio and video telemedicine (Doximity) due to [place] 19 pandemic.  Patient location: Home Provider location: Office Used 2 patient identifiers to confirm the correct person. Explained the limitations in evaluation and management via telemedicine. Patient is aware of potential medical charges for this visit.  Patient consented to this virtual visit.  The persons participating in this telemedicine service were myself and the patient  Chief complaint: Nausea, vomiting and diarrhea  HPI:  She continues to have persistent nausea, vomiting and diarrhea.  She has not scheduled Entyvio infusion yet.  Patient says she is going to see her endocrinologist, "her thyroid level is high." She is tolerating diet but has intermittent episodes of vomiting multiple times a week.  She is able to maintain hydration and electrolytes within normal limits based on recent labs.  Colonoscopy March 13, 2018 at [facility] colon polyp adenoma Right and left colon biopsies showed focal active colitis with minimal architectural distortion negative for granuloma or dysplasia Rectum polyp adenoma removed  She was referred to Dr Derrill Kay [facility]). She was started on Citrucel, her symptoms were thought to be predominantly IBS related.   CT abd & pelvis with contrast 04/25/18 Fluid filled colon extending to the level of the mid descending colon without frank pericolonic inflammatory changes. No CT evidence for active disease but mild early active disease can be occult on imaging. Submucosal fat deposition involving most of the colon likely sequela of chronic inflammatory bowel disease. Other ancillary findings as above.   Outpatient Encounter Medications as of 08/03/2018   Medication Sig  . albuterol (PROAIR HFA) 108 (90 BASE) MCG/ACT inhaler Inhale 2 puffs into the lungs every 4 (four) hours as needed for wheezing or shortness of breath.   . colestipol (COLESTID) 1 g tablet Take 1 tablet (1 g total) by mouth 3 (three) times daily.  . cyanocobalamin (,VITAMIN B-12,) 1000 MCG/ML injection Inject 29m SQ once every 7 days x 4 then inject 1 ml SQ every 28 days. (Patient taking differently: Inject 1,000 mcg into the muscle every 30 (thirty) days. )  . dronabinol (MARINOL) 2.5 MG capsule Take 2.5 mg by mouth 2 (two) times daily.  .Marland Kitcheneconazole nitrate 1 % cream APPLY  CREAM TOPICALLY ONCE DAILY (Patient taking differently: Apply 1 application topically daily. )  . gabapentin (NEURONTIN) 400 MG capsule Take 400-1,200 mg by mouth 3 (three) times daily. Take 1 capsule by mouth at breakfast, 2 at lunch and 3 at dinner  . hydrocortisone (CORTEF) 5 MG tablet Take 5 mg by mouth 2 (two) times daily.  . hyoscyamine (LEVSIN SL) 0.125 MG SL tablet Place 0.125 mg under the tongue 4 (four) times daily.  .Marland KitchenlevETIRAcetam (KEPPRA) 250 MG tablet Take 250 mg by mouth 2 (two) times daily.  . lipase/protease/amylase (CREON) 36000 UNITS CPEP capsule Take 2  ( 72,000units) by mouth three times a day with meals one (36,000units) with snacks This is a dose increase for this -patient  . methimazole (TAPAZOLE) 5 MG tablet Take 5 mg by mouth daily.  . Multiple Vitamin (MULTIVITAMIN WITH MINERALS) TABS tablet Take 1 tablet by mouth daily.  . Needles & Syringes MISC 1 Syringe by Does not apply route every 30 (thirty) days.  .Marland Kitchen  omeprazole (PRILOSEC) 40 MG capsule Take 1 capsule (40 mg total) by mouth daily.  . Oxycodone HCl 10 MG TABS Take 10 mg by mouth 5 (five) times daily as needed (pain). 1 tablet up to five times a day as needed for severe pain.   Marland Kitchen Potassium Chloride ER 20 MEQ TBCR Take 20 mEq by mouth 2 (two) times daily for 2 days.  . pramoxine-hydrocortisone (PROCTOCREAM-HC) 1-1 % rectal cream  Place 1 application rectally 2 (two) times daily.  . promethazine (PHENERGAN) 25 MG suppository Place 1 suppository (25 mg total) rectally every 6 (six) hours as needed for nausea or vomiting.  . promethazine (PHENERGAN) 25 MG tablet Take 1 tablet (25 mg total) by mouth daily as needed for nausea or vomiting.  . propranolol ER (INDERAL LA) 80 MG 24 hr capsule Take 1 capsule by mouth daily.  . traZODone (DESYREL) 50 MG tablet Take 50 mg by mouth at bedtime as needed for sleep.   . vedolizumab (ENTYVIO) 300 MG injection Inject 300 mg into the vein every 8 (eight) weeks.  Marland Kitchen VITAMIN D PO Take 1 tablet by mouth daily.  Marland Kitchen zolmitriptan (ZOMIG) 5 MG nasal solution 1 spray in one nostril.  May repeat 1 spray once after 2 hours if needed. Not to exceed 2 sprays in 24 hours (Patient taking differently: Place 1 spray into the nose daily as needed for migraine. 1 spray in one nostril.  May repeat 1 spray once after 2 hours if needed. Not to exceed 2 sprays in 24 hours)   No facility-administered encounter medications on file as of 08/03/2018.     Allergies as of 08/03/2018 - Review Complete 08/02/2018  Allergen Reaction Noted  . Codeine Hives and Nausea And Vomiting 08/31/2007  . Amitriptyline Itching and Hives 01/19/2018  . Humira [adalimumab] Rash 12/24/2013    Past Medical History:  Diagnosis Date  . Arthritis   . Chronic diarrhea   . Chronic disease anemia   . Crohn's disease ([place])   . Depression   . Family history of malignant neoplasm of gastrointestinal tract   . GERD (gastroesophageal reflux disease)   . H/O hiatal hernia   . History of adenomatous polyp of colon   . History of gastritis    AND HX ILEITIS  . History of kidney stones   . History of small bowel obstruction    SECONDARY CROHN'S STRICTURE  S/P COLECTOMY  . Hyperlipidemia   . Hypertension   . Hypopotassemia   . Internal hemorrhoids   . Kidney stones   . Pancreatic insufficiency   . PONV (postoperative nausea and  vomiting)   . Psoriasis    SKIN  . Right ureteral stone   . Rotavirus infection 05/31/2013  . S/P dilatation of esophageal stricture   . Seasonal asthma   . Urgency of urination   . Vitamin B12 deficiency     Past Surgical History:  Procedure Laterality Date  . ANAL FISSURE REPAIR  1990's  . CARPAL TUNNEL RELEASE Right 2000  . CHOLECYSTECTOMY  2002  . CYSTOSCOPY WITH RETROGRADE PYELOGRAM, URETEROSCOPY AND STENT PLACEMENT Left 03/08/2013   Procedure: CYSTOSCOPY WITH RETROGRADE PYELOGRAM, URETEROSCOPY AND STENT PLACEMENT stone basketing;  Surgeon: Alexis Frock, MD;  Location: [facility];  Service: Urology;  Laterality: Left;  . CYSTOSCOPY WITH RETROGRADE PYELOGRAM, URETEROSCOPY AND STENT PLACEMENT Right 04/03/2013   Procedure: CYSTOSCOPY WITH RETROGRADE PYELOGRAM, URETEROSCOPY AND STENT PLACEMENT;  Surgeon: Alexis Frock, MD;  Location: [facility];  Service: Urology;  Laterality: Right;  . DX LAPAROSCOPY CONVERTED TO OPEN RIGHT COLECCTOMY  09-15-2010   ANASTOMOTIC STRICTURE  . LAPAROSCOPIC ILEOCECECTOMY  10-09-2008   AND APPENDECTOMY (TERMINAL ILEITIS & PARTIAL SBO)  . SPINAL CORD STIMULATOR IMPLANT Left 05/2017  . VAGINAL HYSTERECTOMY  1992    Family History  Problem Relation Age of Onset  . Colon cancer Mother   . Colon polyps Mother   . Colon polyps Father   . Lung cancer Father   . Breast cancer Paternal Grandmother   . Colon polyps Brother   . Thyroid disease Neg Hx   . Stomach cancer Neg Hx   . Pancreatic cancer Neg Hx     Social History   Socioeconomic History  . Marital status: Married    Spouse name: Not on file  . Number of children: 2  . Years of education: Not on file  . Highest education level: Not on file  Occupational History  . Occupation: unemployed    Fish farm manager: [employer]  Social Needs  . Financial resource strain: Not on file  . Food insecurity:    Worry: Not on file    Inability: Not on file  . Transportation needs:    Medical: Not  on file    Non-medical: Not on file  Tobacco Use  . Smoking status: Former Smoker    Packs/day: 1.00    Years: 10.00    Pack years: 10.00    Types: Cigarettes    Last attempt to quit: 03/07/1984    Years since quitting: 34.4  . Smokeless tobacco: Never Used  Substance and Sexual Activity  . Alcohol use: No    Alcohol/week: 0.0 standard drinks  . Drug use: No  . Sexual activity: Not on file  Lifestyle  . Physical activity:    Days per week: Not on file    Minutes per session: Not on file  . Stress: Not on file  Relationships  . Social connections:    Talks on phone: Not on file    Gets together: Not on file    Attends religious service: Not on file    Active member of club or organization: Not on file    Attends meetings of clubs or organizations: Not on file    Relationship status: Not on file  . Intimate partner violence:    Fear of current or ex partner: Not on file    Emotionally abused: Not on file    Physically abused: Not on file    Forced sexual activity: Not on file  Other Topics Concern  . Not on file  Social History Narrative  . Not on file      Review of systems: Review of Systems as per HPI All other systems reviewed and are negative.   Physical Exam: Vitals were not taken and physical exam was not performed during this virtual visit.  Data Reviewed:  Reviewed labs, radiology imaging, old records and pertinent past GI work up   Assessment and Plan/Recommendations:  58 year old female with Crohn's disease with small and large intestine involvement, Addison's disease, pancreatic insufficiency, gastroparesis, chronic narcotic use and irritable bowel syndrome. Persistent nausea and cyclical vomiting likely multifactorial Continue Phenergan as needed Advised patient to maintain adequate hydration Small frequent meals Limit use of narcotics Restart Entyvio infusion as soon as possible, reassured patient that infusion center is taking adequate  precautions to avoid/decrease exposure to COVID-19 Follow-up in 3 months or sooner if needed.  She has follow-up appointment with Dr. Derrill Kay  next month    K. Denzil Magnuson , MD   CC: Rochel Brome, MD

## 2018-08-03 NOTE — Telephone Encounter (Signed)
-----   Message from Audrea Muscat, [place] sent at 08/03/2018 10:51 AM EDT ----- I sent this to you as well just so someone saw it since I wasn't sure when Eustaquio Maize would be back.  Dr. Silverio Decamp wants [name] infusion scheduled "ASAP" since she hasn't had one since February.  Sorry!  Thank you!

## 2018-08-03 NOTE — Patient Instructions (Addendum)
Continue present medications.  Maintain adequate hydration  Small frequent meals  Please schedule entyvio infusion ASAP, she hasnt had infusion since Feb.  Dr. Woodward Ku nurse will call you to set this up.    Follow up office visit in 3 months.  Dr. Woodward Ku schedule does not go out that far yet.  Please call the office in the next month or so to set up this appointment for around the end of August.

## 2018-08-05 ENCOUNTER — Encounter: Payer: Self-pay | Admitting: Gastroenterology

## 2018-08-06 NOTE — Telephone Encounter (Signed)
PATIENT HAD ANOTHER VISIT WITH DR NANDIGAM. AGREED TO RESTART INFUSIONS. SCHEDULED BY Jowana HUNT, RN

## 2018-08-09 DIAGNOSIS — M25462 Effusion, left knee: Secondary | ICD-10-CM | POA: Diagnosis not present

## 2018-08-09 DIAGNOSIS — M5137 Other intervertebral disc degeneration, lumbosacral region: Secondary | ICD-10-CM | POA: Diagnosis not present

## 2018-08-09 DIAGNOSIS — M7060 Trochanteric bursitis, unspecified hip: Secondary | ICD-10-CM | POA: Diagnosis not present

## 2018-08-09 DIAGNOSIS — M47817 Spondylosis without myelopathy or radiculopathy, lumbosacral region: Secondary | ICD-10-CM | POA: Diagnosis not present

## 2018-08-21 ENCOUNTER — Other Ambulatory Visit: Payer: Self-pay

## 2018-08-21 ENCOUNTER — Encounter (HOSPITAL_COMMUNITY): Payer: Self-pay

## 2018-08-21 ENCOUNTER — Ambulatory Visit (HOSPITAL_COMMUNITY)
Admission: RE | Admit: 2018-08-21 | Discharge: 2018-08-21 | Disposition: A | Discharge status: Home / Self Care | Payer: BC Managed Care – PPO | Source: Ambulatory Visit | Attending: Gastroenterology | Admitting: Gastroenterology

## 2018-08-21 DIAGNOSIS — K5000 Crohn's disease of small intestine without complications: Secondary | ICD-10-CM | POA: Insufficient documentation

## 2018-08-21 DIAGNOSIS — K50012 Crohn's disease of small intestine with intestinal obstruction: Secondary | ICD-10-CM

## 2018-08-21 MED ORDER — SODIUM CHLORIDE 0.9 % IV SOLN
Freq: Once | INTRAVENOUS | Status: AC
  Administered 2018-08-21: 10:00:00 via INTRAVENOUS

## 2018-08-21 MED ORDER — VEDOLIZUMAB 300 MG IV SOLR
300.0000 mg | INTRAVENOUS | Status: DC
  Administered 2018-08-21: 300 mg via INTRAVENOUS
  Filled 2018-08-21: qty 5

## 2018-08-21 NOTE — Progress Notes (Signed)
Called Nandigam's office to clarify the frequency of the Entyvio.  Patient received Entyvio 300 mg IV today.  Made next appointment for 8 weeks from now, as is the typical dosage for Entyvio.  Pt hasn't had med since November 2019.  After looking at patients chart, noted she has received every 4 weeks in the past.  Spoke with office and informed them the frequency needs to be clarified and new orders need to be entered in the computer since pt was an inpatient a few months ago and orders need clarifying.  They state they will clarify and call back.

## 2018-08-21 NOTE — Progress Notes (Signed)
Clarified with office that pt is to come every 4 weeks for Entyvio.  Called pt today to inform her the next appointment has been moved to July 14 at 9 am.  No answer, but left message regarding new appointment.

## 2018-08-29 ENCOUNTER — Telehealth: Payer: Self-pay | Admitting: Gastroenterology

## 2018-08-29 NOTE — Telephone Encounter (Signed)
Not able to get through, please get an alternate number. Thanks

## 2018-08-29 NOTE — Telephone Encounter (Signed)
[name]  The Peer to Peer was done today and was approved for 8 weeks, She will need another prior authorization after that per Dr Silverio Decamp  Thanks

## 2018-08-29 NOTE — Telephone Encounter (Signed)
T[place] from [place] called regarding a peer-to-peer request for this pt.  Please call back [phone]

## 2018-08-29 NOTE — Telephone Encounter (Signed)
Dr Silverio Decamp the Peer to peer interview

## 2018-08-29 NOTE — Telephone Encounter (Signed)
They will call back today between now and 4pm for the peer to peer

## 2018-08-30 NOTE — Telephone Encounter (Signed)
I am sharing this with you. She has infusions in the hospital as far as I know.

## 2018-08-31 ENCOUNTER — Other Ambulatory Visit: Payer: Self-pay | Admitting: Gastroenterology

## 2018-09-03 ENCOUNTER — Other Ambulatory Visit: Payer: Self-pay

## 2018-09-10 ENCOUNTER — Other Ambulatory Visit: Payer: Self-pay

## 2018-09-10 DIAGNOSIS — K50012 Crohn's disease of small intestine with intestinal obstruction: Secondary | ICD-10-CM

## 2018-09-10 NOTE — Telephone Encounter (Signed)
Beth, please have infusion center update her appt.  They had her scheduled for 8 wks then changed to 4wks, but after peer to peer, it was authorized for 8wks.  She is authorized till 02/20/19.

## 2018-09-10 NOTE — Telephone Encounter (Signed)
Dr Silverio Decamp I need clarification on the [facility] please. Please repeat the plan.

## 2018-09-10 NOTE — Telephone Encounter (Signed)
Thank you. I have entered the infusion for every 8 weeks after her induction and reached out to [place] at the infusion center.

## 2018-09-11 NOTE — Telephone Encounter (Signed)
She is approved for Entyvio standard infusion but we will need to appeal to decrease the interval to every 4 weeks, until that is approved she will continue infusion every 8 weeks. Thanks

## 2018-09-11 NOTE — Telephone Encounter (Signed)
Pole Ojea with you.

## 2018-09-14 DIAGNOSIS — M7060 Trochanteric bursitis, unspecified hip: Secondary | ICD-10-CM | POA: Diagnosis not present

## 2018-09-14 DIAGNOSIS — Z79891 Long term (current) use of opiate analgesic: Secondary | ICD-10-CM | POA: Diagnosis not present

## 2018-09-14 DIAGNOSIS — G894 Chronic pain syndrome: Secondary | ICD-10-CM | POA: Diagnosis not present

## 2018-09-14 DIAGNOSIS — G629 Polyneuropathy, unspecified: Secondary | ICD-10-CM | POA: Diagnosis not present

## 2018-09-14 DIAGNOSIS — Z79899 Other long term (current) drug therapy: Secondary | ICD-10-CM | POA: Diagnosis not present

## 2018-09-14 DIAGNOSIS — M5137 Other intervertebral disc degeneration, lumbosacral region: Secondary | ICD-10-CM | POA: Diagnosis not present

## 2018-09-18 ENCOUNTER — Other Ambulatory Visit: Payer: Self-pay

## 2018-09-18 ENCOUNTER — Encounter (HOSPITAL_COMMUNITY): Payer: Self-pay

## 2018-09-18 ENCOUNTER — Ambulatory Visit (HOSPITAL_COMMUNITY)
Admission: RE | Admit: 2018-09-18 | Discharge: 2018-09-18 | Disposition: A | Discharge status: Home / Self Care | Payer: BC Managed Care – PPO | Source: Ambulatory Visit | Attending: Gastroenterology | Admitting: Gastroenterology

## 2018-09-18 DIAGNOSIS — K50012 Crohn's disease of small intestine with intestinal obstruction: Secondary | ICD-10-CM | POA: Diagnosis not present

## 2018-09-18 MED ORDER — SODIUM CHLORIDE 0.9 % IV SOLN
INTRAVENOUS | Status: DC
  Administered 2018-09-18: 09:00:00 via INTRAVENOUS

## 2018-09-18 MED ORDER — VEDOLIZUMAB 300 MG IV SOLR
300.0000 mg | INTRAVENOUS | Status: DC
  Administered 2018-09-18: 300 mg via INTRAVENOUS
  Filled 2018-09-18: qty 5

## 2018-09-28 ENCOUNTER — Other Ambulatory Visit: Payer: Self-pay | Admitting: Gastroenterology

## 2018-09-30 ENCOUNTER — Other Ambulatory Visit: Payer: Self-pay

## 2018-10-17 ENCOUNTER — Encounter (HOSPITAL_COMMUNITY): Payer: BC Managed Care – PPO

## 2018-10-25 ENCOUNTER — Other Ambulatory Visit: Payer: Self-pay | Admitting: Gastroenterology

## 2018-11-13 ENCOUNTER — Ambulatory Visit (HOSPITAL_COMMUNITY): Payer: Medicare Other

## 2018-11-13 DIAGNOSIS — M7060 Trochanteric bursitis, unspecified hip: Secondary | ICD-10-CM | POA: Diagnosis not present

## 2018-11-13 DIAGNOSIS — G629 Polyneuropathy, unspecified: Secondary | ICD-10-CM | POA: Diagnosis not present

## 2018-11-13 DIAGNOSIS — M5137 Other intervertebral disc degeneration, lumbosacral region: Secondary | ICD-10-CM | POA: Diagnosis not present

## 2018-11-13 DIAGNOSIS — G894 Chronic pain syndrome: Secondary | ICD-10-CM | POA: Diagnosis not present

## 2018-11-16 ENCOUNTER — Other Ambulatory Visit: Payer: Self-pay | Admitting: Gastroenterology

## 2018-11-23 DIAGNOSIS — Z23 Encounter for immunization: Secondary | ICD-10-CM | POA: Diagnosis not present

## 2018-11-23 DIAGNOSIS — K5080 Crohn's disease of both small and large intestine without complications: Secondary | ICD-10-CM | POA: Diagnosis not present

## 2018-12-04 DIAGNOSIS — E0590 Thyrotoxicosis, unspecified without thyrotoxic crisis or storm: Secondary | ICD-10-CM | POA: Diagnosis not present

## 2018-12-06 ENCOUNTER — Other Ambulatory Visit: Payer: Self-pay | Admitting: Gastroenterology

## 2018-12-13 DIAGNOSIS — Z79891 Long term (current) use of opiate analgesic: Secondary | ICD-10-CM | POA: Diagnosis not present

## 2018-12-13 DIAGNOSIS — G894 Chronic pain syndrome: Secondary | ICD-10-CM | POA: Diagnosis not present

## 2018-12-13 DIAGNOSIS — Z79899 Other long term (current) drug therapy: Secondary | ICD-10-CM | POA: Diagnosis not present

## 2018-12-13 DIAGNOSIS — M7060 Trochanteric bursitis, unspecified hip: Secondary | ICD-10-CM | POA: Diagnosis not present

## 2018-12-24 MED ORDER — CYANOCOBALAMIN 1000 MCG/ML IJ SOLN: INTRAMUSCULAR | 0 refills | Status: AC

## 2018-12-25 DIAGNOSIS — K50818 Crohn's disease of both small and large intestine with other complication: Secondary | ICD-10-CM | POA: Diagnosis not present

## 2018-12-25 DIAGNOSIS — K909 Intestinal malabsorption, unspecified: Secondary | ICD-10-CM | POA: Diagnosis not present

## 2018-12-25 DIAGNOSIS — R634 Abnormal weight loss: Secondary | ICD-10-CM | POA: Diagnosis not present

## 2018-12-25 DIAGNOSIS — R1310 Dysphagia, unspecified: Secondary | ICD-10-CM | POA: Diagnosis not present

## 2018-12-25 DIAGNOSIS — E538 Deficiency of other specified B group vitamins: Secondary | ICD-10-CM | POA: Diagnosis not present

## 2018-12-25 DIAGNOSIS — R1114 Bilious vomiting: Secondary | ICD-10-CM | POA: Diagnosis not present

## 2018-12-26 DIAGNOSIS — K3189 Other diseases of stomach and duodenum: Secondary | ICD-10-CM | POA: Diagnosis not present

## 2018-12-26 DIAGNOSIS — R1013 Epigastric pain: Secondary | ICD-10-CM | POA: Diagnosis not present

## 2018-12-26 DIAGNOSIS — R112 Nausea with vomiting, unspecified: Secondary | ICD-10-CM | POA: Diagnosis not present

## 2018-12-26 DIAGNOSIS — R1310 Dysphagia, unspecified: Secondary | ICD-10-CM | POA: Diagnosis not present

## 2019-01-01 ENCOUNTER — Other Ambulatory Visit: Payer: Self-pay | Admitting: Gastroenterology

## 2019-01-08 ENCOUNTER — Ambulatory Visit (HOSPITAL_COMMUNITY): Payer: Medicare Other

## 2019-01-10 DIAGNOSIS — M7060 Trochanteric bursitis, unspecified hip: Secondary | ICD-10-CM | POA: Diagnosis not present

## 2019-01-10 DIAGNOSIS — G629 Polyneuropathy, unspecified: Secondary | ICD-10-CM | POA: Diagnosis not present

## 2019-01-10 DIAGNOSIS — G894 Chronic pain syndrome: Secondary | ICD-10-CM | POA: Diagnosis not present

## 2019-01-10 DIAGNOSIS — M5137 Other intervertebral disc degeneration, lumbosacral region: Secondary | ICD-10-CM | POA: Diagnosis not present

## 2019-01-11 DIAGNOSIS — R634 Abnormal weight loss: Secondary | ICD-10-CM | POA: Diagnosis not present

## 2019-01-11 DIAGNOSIS — D126 Benign neoplasm of colon, unspecified: Secondary | ICD-10-CM | POA: Diagnosis not present

## 2019-01-11 DIAGNOSIS — R112 Nausea with vomiting, unspecified: Secondary | ICD-10-CM | POA: Diagnosis not present

## 2019-01-11 DIAGNOSIS — R109 Unspecified abdominal pain: Secondary | ICD-10-CM | POA: Diagnosis not present

## 2019-02-06 DIAGNOSIS — K50819 Crohn's disease of both small and large intestine with unspecified complications: Secondary | ICD-10-CM | POA: Diagnosis not present

## 2019-02-06 DIAGNOSIS — R1114 Bilious vomiting: Secondary | ICD-10-CM | POA: Diagnosis not present

## 2019-02-06 DIAGNOSIS — K591 Functional diarrhea: Secondary | ICD-10-CM | POA: Diagnosis not present

## 2019-02-06 DIAGNOSIS — K219 Gastro-esophageal reflux disease without esophagitis: Secondary | ICD-10-CM | POA: Diagnosis not present

## 2019-02-07 DIAGNOSIS — G894 Chronic pain syndrome: Secondary | ICD-10-CM | POA: Diagnosis not present

## 2019-02-07 DIAGNOSIS — M47816 Spondylosis without myelopathy or radiculopathy, lumbar region: Secondary | ICD-10-CM | POA: Diagnosis not present

## 2019-02-07 DIAGNOSIS — M79651 Pain in right thigh: Secondary | ICD-10-CM | POA: Diagnosis not present

## 2019-02-07 DIAGNOSIS — M5136 Other intervertebral disc degeneration, lumbar region: Secondary | ICD-10-CM | POA: Diagnosis not present

## 2019-03-04 DIAGNOSIS — Z20828 Contact with and (suspected) exposure to other viral communicable diseases: Secondary | ICD-10-CM | POA: Diagnosis not present

## 2019-03-13 IMAGING — DX DG ABD PORTABLE 1V
1 series · 1 of 1 positions shown · non-contrast
Comparison: Abdominal CT dated 05/24/2016 and radiographs dated
05/25/2016 and 05/24/2016

CLINICAL DATA: 55-year-old female with small bowel obstruction. 8
hour delayed image.

EXAM:
PORTABLE ABDOMEN - 1 VIEW

[abdomen kub]
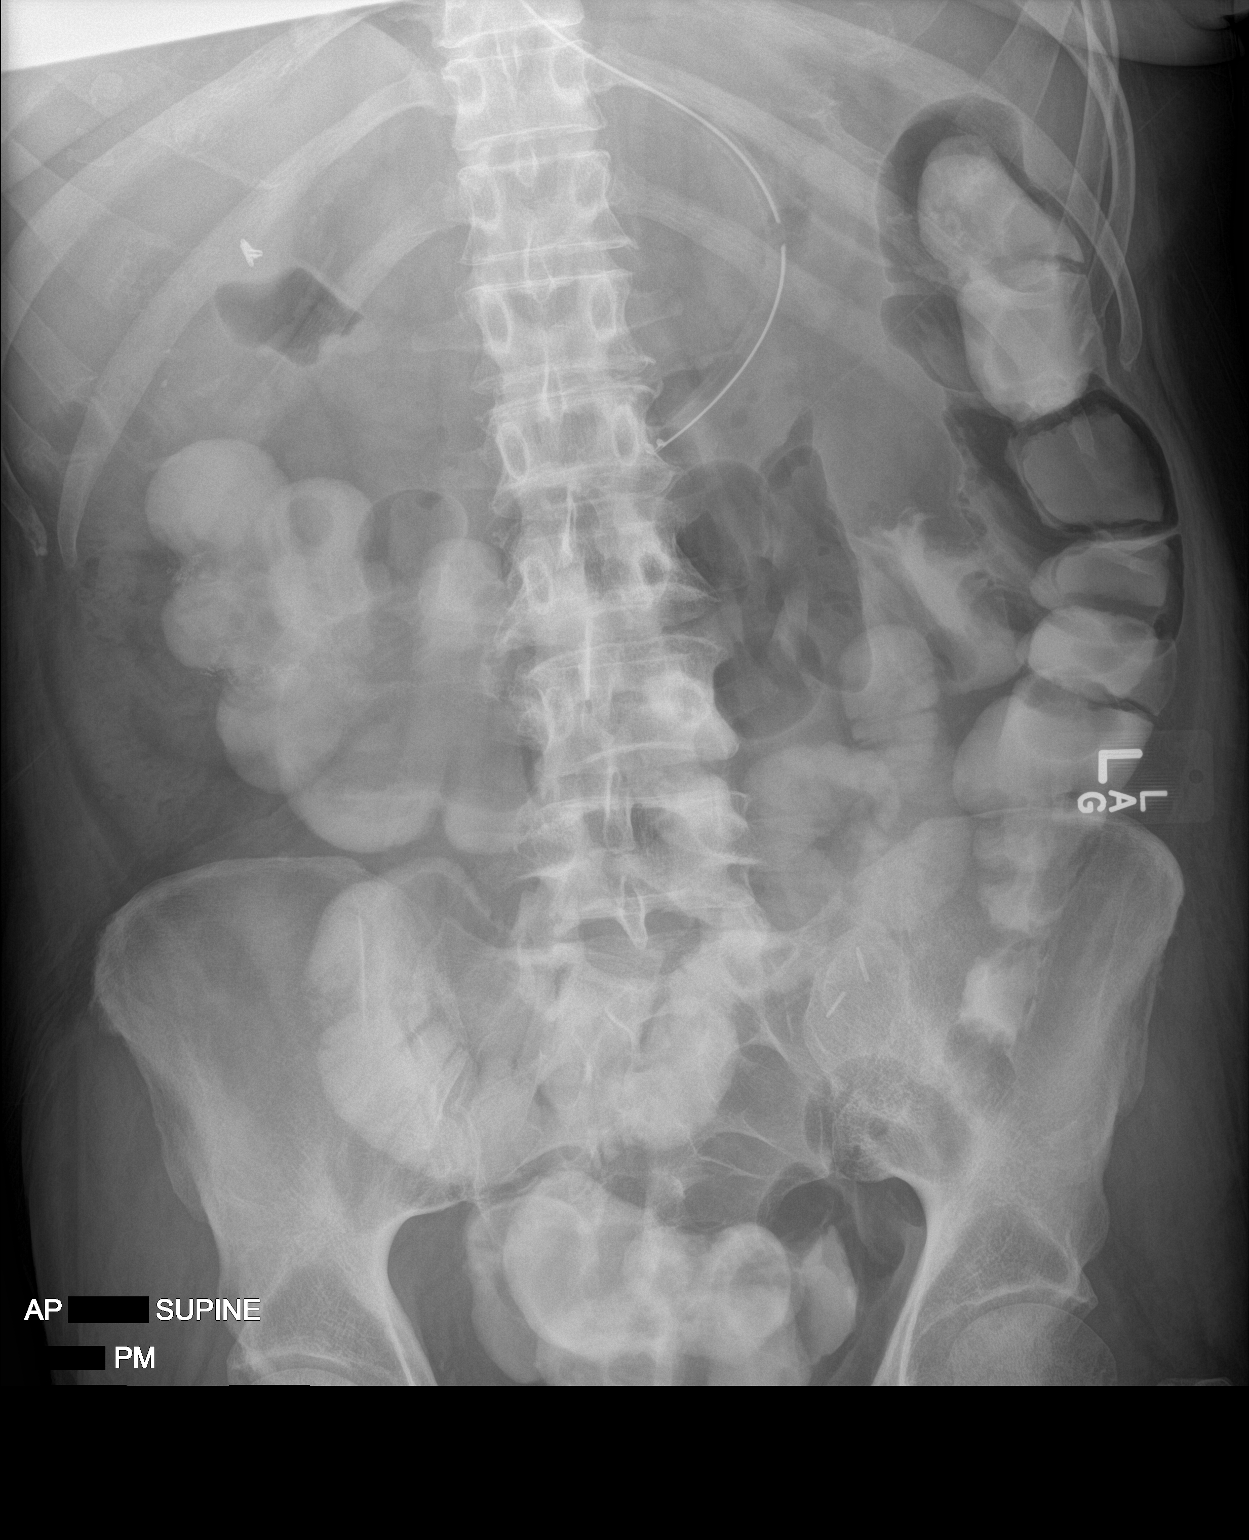

[1 of 1 positions shown; findings below may reference images not displayed]

FINDINGS: Oral contrast is seen in the distal small bowel as well as
throughout the colon and rectosigmoid. There is mild dilatation of
the small bowel in the right lower quadrant measuring 3.5 cm in
diameter. Chief an enteric tube is noted within the stomach. No free
air identified. Right upper quadrant cholecystectomy clips.
IMPRESSION: Mildly dilated small bowel loop in the right lower quadrant
measuring 3.5 cm in diameter. There is however passage of contrast
into the colon.

## 2019-03-15 DIAGNOSIS — Z79899 Other long term (current) drug therapy: Secondary | ICD-10-CM | POA: Diagnosis not present

## 2019-03-15 DIAGNOSIS — S79921A Unspecified injury of right thigh, initial encounter: Secondary | ICD-10-CM | POA: Diagnosis not present

## 2019-03-15 DIAGNOSIS — S79911A Unspecified injury of right hip, initial encounter: Secondary | ICD-10-CM | POA: Diagnosis not present

## 2019-03-15 DIAGNOSIS — M25551 Pain in right hip: Secondary | ICD-10-CM | POA: Diagnosis not present

## 2019-03-15 DIAGNOSIS — M79651 Pain in right thigh: Secondary | ICD-10-CM | POA: Diagnosis not present

## 2019-03-15 DIAGNOSIS — M8789 Other osteonecrosis, multiple sites: Secondary | ICD-10-CM | POA: Diagnosis not present

## 2019-03-15 DIAGNOSIS — M25559 Pain in unspecified hip: Secondary | ICD-10-CM | POA: Diagnosis not present

## 2019-03-15 IMAGING — DX DG ABDOMEN 2V
4 series · 4 of 4 positions shown · non-contrast
Comparison: 05/26/2016 abdominal radiograph

CLINICAL DATA: Small bowel obstruction

EXAM:
ABDOMEN - 2 VIEW

[abdomen supine (1 of 2)]
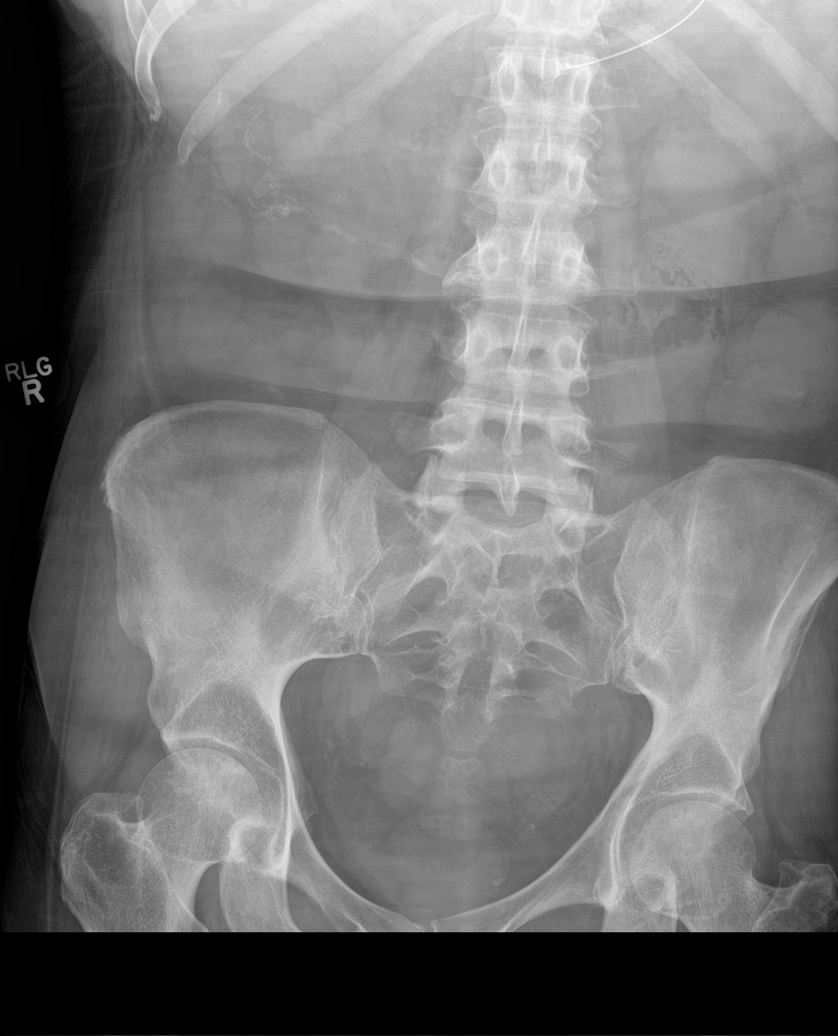

[abdomen supine (2 of 2)]
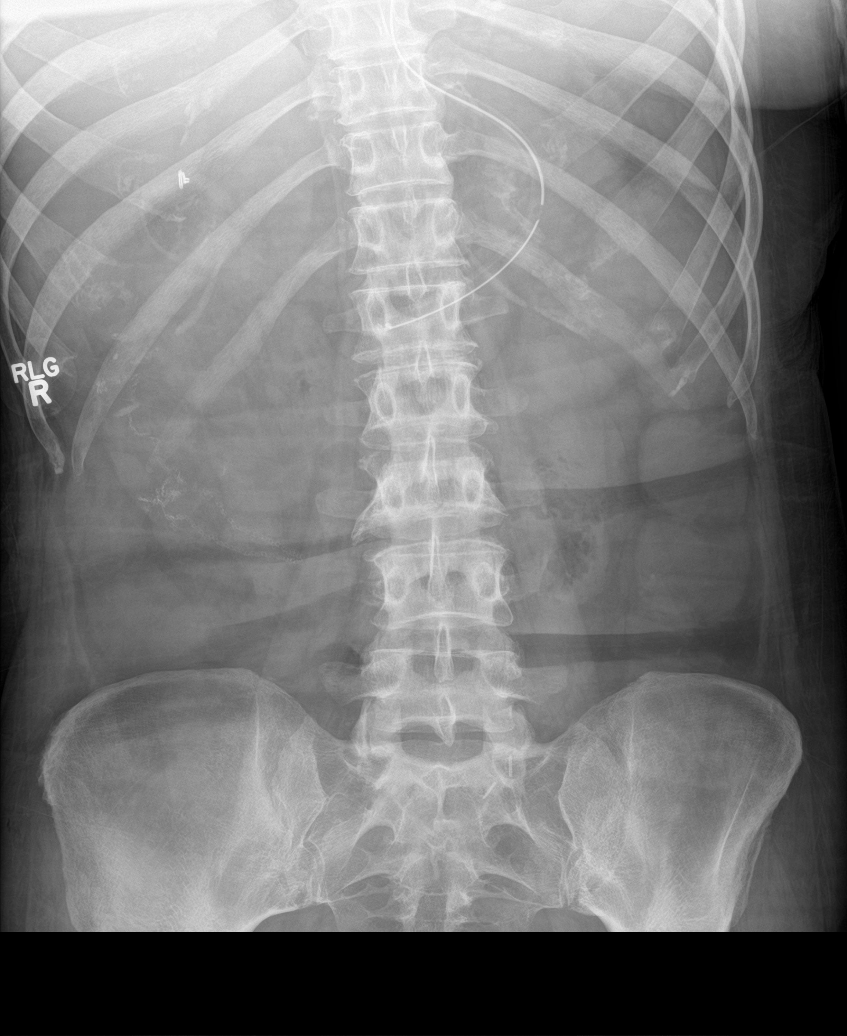

[abdomen decu (1 of 2)]
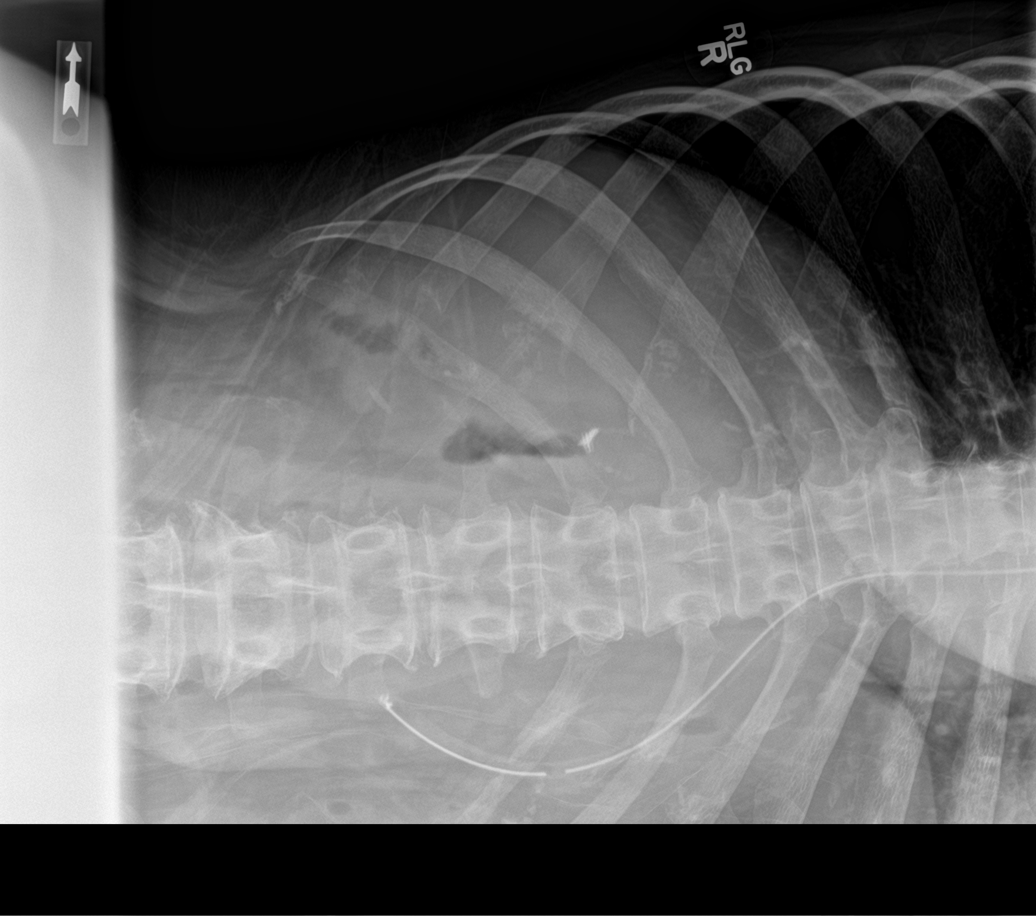

[abdomen decu (2 of 2)]
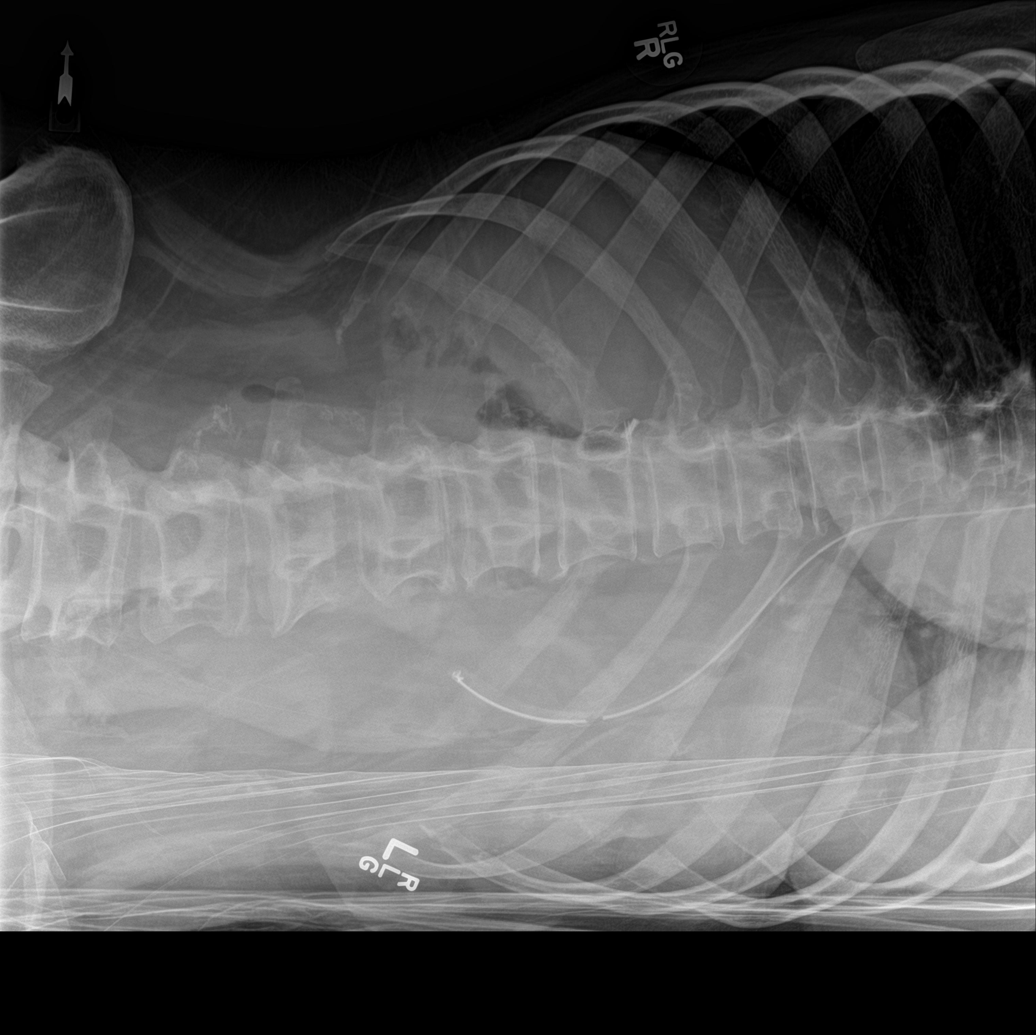

[4 of 4 positions shown; findings below may reference images not displayed]

FINDINGS: Enteric tube terminates in the body of the stomach. Cholecystectomy
clips are seen in the right upper quadrant of the abdomen. Surgical
sutures overlie the right mid abdomen. Relatively gasless abdomen.
No appreciable dilated small bowel loops. A few scattered small
air-fluid levels are seen in the right abdomen on the decubitus
views. No evidence of pneumatosis or pneumoperitoneum. No radiopaque
urolithiasis. Mild thoracolumbar spondylosis.
IMPRESSION: Relatively gasless abdomen with no appreciable dilated small bowel
loops. Nonspecific small air-fluid levels in the right abdomen could
be due to ileus or small-bowel obstruction.

Enteric tube terminates in the body of the stomach.

## 2019-03-18 DIAGNOSIS — Z79891 Long term (current) use of opiate analgesic: Secondary | ICD-10-CM | POA: Diagnosis not present

## 2019-03-18 DIAGNOSIS — M47816 Spondylosis without myelopathy or radiculopathy, lumbar region: Secondary | ICD-10-CM | POA: Diagnosis not present

## 2019-03-18 DIAGNOSIS — Z79899 Other long term (current) drug therapy: Secondary | ICD-10-CM | POA: Diagnosis not present

## 2019-03-18 DIAGNOSIS — M5136 Other intervertebral disc degeneration, lumbar region: Secondary | ICD-10-CM | POA: Diagnosis not present

## 2019-03-18 DIAGNOSIS — G894 Chronic pain syndrome: Secondary | ICD-10-CM | POA: Diagnosis not present

## 2019-03-18 DIAGNOSIS — M25551 Pain in right hip: Secondary | ICD-10-CM | POA: Diagnosis not present

## 2019-04-05 DIAGNOSIS — E0590 Thyrotoxicosis, unspecified without thyrotoxic crisis or storm: Secondary | ICD-10-CM | POA: Diagnosis not present

## 2019-04-10 DIAGNOSIS — E860 Dehydration: Secondary | ICD-10-CM | POA: Diagnosis not present

## 2019-04-10 DIAGNOSIS — R1030 Lower abdominal pain, unspecified: Secondary | ICD-10-CM | POA: Diagnosis not present

## 2019-04-10 DIAGNOSIS — R1114 Bilious vomiting: Secondary | ICD-10-CM | POA: Diagnosis not present

## 2019-04-16 DIAGNOSIS — M7060 Trochanteric bursitis, unspecified hip: Secondary | ICD-10-CM | POA: Diagnosis not present

## 2019-04-16 DIAGNOSIS — M47817 Spondylosis without myelopathy or radiculopathy, lumbosacral region: Secondary | ICD-10-CM | POA: Diagnosis not present

## 2019-04-16 DIAGNOSIS — G629 Polyneuropathy, unspecified: Secondary | ICD-10-CM | POA: Diagnosis not present

## 2019-04-16 DIAGNOSIS — M25462 Effusion, left knee: Secondary | ICD-10-CM | POA: Diagnosis not present

## 2019-04-23 DIAGNOSIS — D122 Benign neoplasm of ascending colon: Secondary | ICD-10-CM | POA: Diagnosis not present

## 2019-04-23 DIAGNOSIS — K5080 Crohn's disease of both small and large intestine without complications: Secondary | ICD-10-CM | POA: Diagnosis not present

## 2019-04-23 DIAGNOSIS — D125 Benign neoplasm of sigmoid colon: Secondary | ICD-10-CM | POA: Diagnosis not present

## 2019-04-23 DIAGNOSIS — K635 Polyp of colon: Secondary | ICD-10-CM | POA: Diagnosis not present

## 2019-04-23 DIAGNOSIS — Z980 Intestinal bypass and anastomosis status: Secondary | ICD-10-CM | POA: Diagnosis not present

## 2019-05-17 DIAGNOSIS — Z79891 Long term (current) use of opiate analgesic: Secondary | ICD-10-CM | POA: Diagnosis not present

## 2019-05-17 DIAGNOSIS — M5137 Other intervertebral disc degeneration, lumbosacral region: Secondary | ICD-10-CM | POA: Diagnosis not present

## 2019-05-17 DIAGNOSIS — M47817 Spondylosis without myelopathy or radiculopathy, lumbosacral region: Secondary | ICD-10-CM | POA: Diagnosis not present

## 2019-05-17 DIAGNOSIS — M7060 Trochanteric bursitis, unspecified hip: Secondary | ICD-10-CM | POA: Diagnosis not present

## 2019-05-17 DIAGNOSIS — G894 Chronic pain syndrome: Secondary | ICD-10-CM | POA: Diagnosis not present

## 2019-05-17 DIAGNOSIS — Z79899 Other long term (current) drug therapy: Secondary | ICD-10-CM | POA: Diagnosis not present

## 2019-06-13 DIAGNOSIS — M7060 Trochanteric bursitis, unspecified hip: Secondary | ICD-10-CM | POA: Diagnosis not present

## 2019-06-27 DIAGNOSIS — K591 Functional diarrhea: Secondary | ICD-10-CM | POA: Diagnosis not present

## 2019-06-27 DIAGNOSIS — R1114 Bilious vomiting: Secondary | ICD-10-CM | POA: Diagnosis not present

## 2019-06-27 DIAGNOSIS — E538 Deficiency of other specified B group vitamins: Secondary | ICD-10-CM | POA: Diagnosis not present

## 2019-06-27 DIAGNOSIS — G894 Chronic pain syndrome: Secondary | ICD-10-CM | POA: Diagnosis not present

## 2019-07-10 DIAGNOSIS — M47817 Spondylosis without myelopathy or radiculopathy, lumbosacral region: Secondary | ICD-10-CM | POA: Diagnosis not present

## 2019-07-10 DIAGNOSIS — G629 Polyneuropathy, unspecified: Secondary | ICD-10-CM | POA: Diagnosis not present

## 2019-07-10 DIAGNOSIS — M25462 Effusion, left knee: Secondary | ICD-10-CM | POA: Diagnosis not present

## 2019-07-10 DIAGNOSIS — G894 Chronic pain syndrome: Secondary | ICD-10-CM | POA: Diagnosis not present

## 2019-07-26 DIAGNOSIS — N3001 Acute cystitis with hematuria: Secondary | ICD-10-CM | POA: Diagnosis not present

## 2019-07-26 DIAGNOSIS — K2900 Acute gastritis without bleeding: Secondary | ICD-10-CM | POA: Diagnosis not present

## 2019-07-30 DIAGNOSIS — R1114 Bilious vomiting: Secondary | ICD-10-CM | POA: Diagnosis not present

## 2019-07-30 DIAGNOSIS — R1030 Lower abdominal pain, unspecified: Secondary | ICD-10-CM | POA: Diagnosis not present

## 2019-07-30 DIAGNOSIS — K50918 Crohn's disease, unspecified, with other complication: Secondary | ICD-10-CM | POA: Diagnosis not present

## 2019-07-30 DIAGNOSIS — R197 Diarrhea, unspecified: Secondary | ICD-10-CM | POA: Diagnosis not present

## 2019-07-30 DIAGNOSIS — R7982 Elevated C-reactive protein (CRP): Secondary | ICD-10-CM | POA: Diagnosis not present

## 2019-07-30 DIAGNOSIS — R634 Abnormal weight loss: Secondary | ICD-10-CM | POA: Diagnosis not present

## 2019-07-30 DIAGNOSIS — R109 Unspecified abdominal pain: Secondary | ICD-10-CM | POA: Diagnosis not present

## 2019-08-07 DIAGNOSIS — G894 Chronic pain syndrome: Secondary | ICD-10-CM | POA: Diagnosis not present

## 2019-08-07 DIAGNOSIS — M87059 Idiopathic aseptic necrosis of unspecified femur: Secondary | ICD-10-CM | POA: Diagnosis not present

## 2019-08-07 DIAGNOSIS — M5137 Other intervertebral disc degeneration, lumbosacral region: Secondary | ICD-10-CM | POA: Diagnosis not present

## 2019-08-07 DIAGNOSIS — G629 Polyneuropathy, unspecified: Secondary | ICD-10-CM | POA: Diagnosis not present

## 2019-08-20 DIAGNOSIS — K50918 Crohn's disease, unspecified, with other complication: Secondary | ICD-10-CM | POA: Diagnosis not present

## 2019-08-20 DIAGNOSIS — R1114 Bilious vomiting: Secondary | ICD-10-CM | POA: Diagnosis not present

## 2019-08-20 DIAGNOSIS — R7982 Elevated C-reactive protein (CRP): Secondary | ICD-10-CM | POA: Diagnosis not present

## 2019-08-20 DIAGNOSIS — R1030 Lower abdominal pain, unspecified: Secondary | ICD-10-CM | POA: Diagnosis not present

## 2019-08-22 DIAGNOSIS — M87059 Idiopathic aseptic necrosis of unspecified femur: Secondary | ICD-10-CM | POA: Diagnosis not present

## 2019-09-02 DIAGNOSIS — Z0189 Encounter for other specified special examinations: Secondary | ICD-10-CM | POA: Diagnosis not present

## 2019-09-02 DIAGNOSIS — Z189 Retained foreign body fragments, unspecified material: Secondary | ICD-10-CM | POA: Diagnosis not present

## 2019-09-02 DIAGNOSIS — E538 Deficiency of other specified B group vitamins: Secondary | ICD-10-CM | POA: Diagnosis not present

## 2019-09-05 DIAGNOSIS — S73191A Other sprain of right hip, initial encounter: Secondary | ICD-10-CM | POA: Diagnosis not present

## 2019-09-05 DIAGNOSIS — M1611 Unilateral primary osteoarthritis, right hip: Secondary | ICD-10-CM | POA: Diagnosis not present

## 2019-09-05 DIAGNOSIS — M25451 Effusion, right hip: Secondary | ICD-10-CM | POA: Diagnosis not present

## 2019-09-05 DIAGNOSIS — M25551 Pain in right hip: Secondary | ICD-10-CM | POA: Diagnosis not present

## 2019-09-06 DIAGNOSIS — M5137 Other intervertebral disc degeneration, lumbosacral region: Secondary | ICD-10-CM | POA: Diagnosis not present

## 2019-09-06 DIAGNOSIS — M47817 Spondylosis without myelopathy or radiculopathy, lumbosacral region: Secondary | ICD-10-CM | POA: Diagnosis not present

## 2019-09-06 DIAGNOSIS — Z79891 Long term (current) use of opiate analgesic: Secondary | ICD-10-CM | POA: Diagnosis not present

## 2019-09-06 DIAGNOSIS — G894 Chronic pain syndrome: Secondary | ICD-10-CM | POA: Diagnosis not present

## 2019-09-06 DIAGNOSIS — Z79899 Other long term (current) drug therapy: Secondary | ICD-10-CM | POA: Diagnosis not present

## 2019-09-06 DIAGNOSIS — M7060 Trochanteric bursitis, unspecified hip: Secondary | ICD-10-CM | POA: Diagnosis not present

## 2019-09-27 DIAGNOSIS — R112 Nausea with vomiting, unspecified: Secondary | ICD-10-CM | POA: Diagnosis not present

## 2019-09-27 DIAGNOSIS — K5000 Crohn's disease of small intestine without complications: Secondary | ICD-10-CM | POA: Diagnosis not present

## 2019-09-27 DIAGNOSIS — R634 Abnormal weight loss: Secondary | ICD-10-CM | POA: Diagnosis not present

## 2019-09-27 DIAGNOSIS — R109 Unspecified abdominal pain: Secondary | ICD-10-CM | POA: Diagnosis not present

## 2019-10-01 DIAGNOSIS — M25551 Pain in right hip: Secondary | ICD-10-CM | POA: Diagnosis not present

## 2019-10-03 DIAGNOSIS — E0590 Thyrotoxicosis, unspecified without thyrotoxic crisis or storm: Secondary | ICD-10-CM | POA: Diagnosis not present

## 2019-10-03 DIAGNOSIS — R110 Nausea: Secondary | ICD-10-CM | POA: Diagnosis not present

## 2019-10-03 DIAGNOSIS — R5383 Other fatigue: Secondary | ICD-10-CM | POA: Diagnosis not present

## 2019-10-04 DIAGNOSIS — G894 Chronic pain syndrome: Secondary | ICD-10-CM | POA: Diagnosis not present

## 2019-10-04 DIAGNOSIS — M47817 Spondylosis without myelopathy or radiculopathy, lumbosacral region: Secondary | ICD-10-CM | POA: Diagnosis not present

## 2019-10-04 DIAGNOSIS — G629 Polyneuropathy, unspecified: Secondary | ICD-10-CM | POA: Diagnosis not present

## 2019-10-04 DIAGNOSIS — M5137 Other intervertebral disc degeneration, lumbosacral region: Secondary | ICD-10-CM | POA: Diagnosis not present

## 2019-10-30 DIAGNOSIS — L400 Psoriasis vulgaris: Secondary | ICD-10-CM | POA: Diagnosis not present

## 2019-10-30 DIAGNOSIS — D1801 Hemangioma of skin and subcutaneous tissue: Secondary | ICD-10-CM | POA: Diagnosis not present

## 2019-11-19 DIAGNOSIS — M7060 Trochanteric bursitis, unspecified hip: Secondary | ICD-10-CM | POA: Diagnosis not present

## 2019-11-19 DIAGNOSIS — M47817 Spondylosis without myelopathy or radiculopathy, lumbosacral region: Secondary | ICD-10-CM | POA: Diagnosis not present

## 2019-11-19 DIAGNOSIS — Z79899 Other long term (current) drug therapy: Secondary | ICD-10-CM | POA: Diagnosis not present

## 2019-11-19 DIAGNOSIS — M5137 Other intervertebral disc degeneration, lumbosacral region: Secondary | ICD-10-CM | POA: Diagnosis not present

## 2019-11-19 DIAGNOSIS — Z79891 Long term (current) use of opiate analgesic: Secondary | ICD-10-CM | POA: Diagnosis not present

## 2019-11-19 DIAGNOSIS — G894 Chronic pain syndrome: Secondary | ICD-10-CM | POA: Diagnosis not present

## 2019-12-03 DIAGNOSIS — M7060 Trochanteric bursitis, unspecified hip: Secondary | ICD-10-CM | POA: Diagnosis not present

## 2019-12-19 DIAGNOSIS — M5137 Other intervertebral disc degeneration, lumbosacral region: Secondary | ICD-10-CM | POA: Diagnosis not present

## 2019-12-19 DIAGNOSIS — M87059 Idiopathic aseptic necrosis of unspecified femur: Secondary | ICD-10-CM | POA: Diagnosis not present

## 2019-12-19 DIAGNOSIS — G894 Chronic pain syndrome: Secondary | ICD-10-CM | POA: Diagnosis not present

## 2019-12-19 DIAGNOSIS — G629 Polyneuropathy, unspecified: Secondary | ICD-10-CM | POA: Diagnosis not present

## 2019-12-27 DIAGNOSIS — K5289 Other specified noninfective gastroenteritis and colitis: Secondary | ICD-10-CM | POA: Diagnosis not present

## 2019-12-27 DIAGNOSIS — Z8719 Personal history of other diseases of the digestive system: Secondary | ICD-10-CM | POA: Diagnosis not present

## 2019-12-27 DIAGNOSIS — G894 Chronic pain syndrome: Secondary | ICD-10-CM | POA: Diagnosis not present

## 2019-12-27 DIAGNOSIS — I10 Essential (primary) hypertension: Secondary | ICD-10-CM | POA: Diagnosis not present

## 2019-12-27 DIAGNOSIS — E0590 Thyrotoxicosis, unspecified without thyrotoxic crisis or storm: Secondary | ICD-10-CM | POA: Diagnosis not present

## 2019-12-27 DIAGNOSIS — K529 Noninfective gastroenteritis and colitis, unspecified: Secondary | ICD-10-CM | POA: Diagnosis not present

## 2019-12-27 DIAGNOSIS — R112 Nausea with vomiting, unspecified: Secondary | ICD-10-CM | POA: Diagnosis not present

## 2019-12-27 DIAGNOSIS — E271 Primary adrenocortical insufficiency: Secondary | ICD-10-CM | POA: Diagnosis not present

## 2019-12-27 DIAGNOSIS — K5000 Crohn's disease of small intestine without complications: Secondary | ICD-10-CM | POA: Diagnosis not present

## 2019-12-27 DIAGNOSIS — F419 Anxiety disorder, unspecified: Secondary | ICD-10-CM | POA: Diagnosis not present

## 2019-12-27 DIAGNOSIS — E876 Hypokalemia: Secondary | ICD-10-CM | POA: Diagnosis not present

## 2019-12-27 DIAGNOSIS — R1310 Dysphagia, unspecified: Secondary | ICD-10-CM | POA: Diagnosis not present

## 2019-12-27 DIAGNOSIS — K626 Ulcer of anus and rectum: Secondary | ICD-10-CM | POA: Diagnosis not present

## 2019-12-27 DIAGNOSIS — F329 Major depressive disorder, single episode, unspecified: Secondary | ICD-10-CM | POA: Diagnosis not present

## 2019-12-27 DIAGNOSIS — Z9049 Acquired absence of other specified parts of digestive tract: Secondary | ICD-10-CM | POA: Diagnosis not present

## 2019-12-27 DIAGNOSIS — K8689 Other specified diseases of pancreas: Secondary | ICD-10-CM | POA: Diagnosis not present

## 2019-12-27 DIAGNOSIS — K219 Gastro-esophageal reflux disease without esophagitis: Secondary | ICD-10-CM | POA: Diagnosis not present

## 2019-12-27 DIAGNOSIS — R109 Unspecified abdominal pain: Secondary | ICD-10-CM | POA: Diagnosis not present

## 2019-12-27 DIAGNOSIS — K50918 Crohn's disease, unspecified, with other complication: Secondary | ICD-10-CM | POA: Diagnosis not present

## 2019-12-27 DIAGNOSIS — F1120 Opioid dependence, uncomplicated: Secondary | ICD-10-CM | POA: Diagnosis not present

## 2019-12-27 DIAGNOSIS — E538 Deficiency of other specified B group vitamins: Secondary | ICD-10-CM | POA: Diagnosis not present

## 2019-12-27 DIAGNOSIS — Z20822 Contact with and (suspected) exposure to covid-19: Secondary | ICD-10-CM | POA: Diagnosis not present

## 2019-12-27 DIAGNOSIS — R1110 Vomiting, unspecified: Secondary | ICD-10-CM | POA: Diagnosis not present

## 2019-12-27 DIAGNOSIS — E0580 Other thyrotoxicosis without thyrotoxic crisis or storm: Secondary | ICD-10-CM | POA: Diagnosis not present

## 2019-12-27 DIAGNOSIS — K6389 Other specified diseases of intestine: Secondary | ICD-10-CM | POA: Diagnosis not present

## 2020-01-14 DIAGNOSIS — M47817 Spondylosis without myelopathy or radiculopathy, lumbosacral region: Secondary | ICD-10-CM | POA: Diagnosis not present

## 2020-01-14 DIAGNOSIS — M5137 Other intervertebral disc degeneration, lumbosacral region: Secondary | ICD-10-CM | POA: Diagnosis not present

## 2020-01-14 DIAGNOSIS — M7060 Trochanteric bursitis, unspecified hip: Secondary | ICD-10-CM | POA: Diagnosis not present

## 2020-01-14 DIAGNOSIS — G894 Chronic pain syndrome: Secondary | ICD-10-CM | POA: Diagnosis not present

## 2020-01-26 DIAGNOSIS — R112 Nausea with vomiting, unspecified: Secondary | ICD-10-CM | POA: Diagnosis not present

## 2020-01-26 DIAGNOSIS — K529 Noninfective gastroenteritis and colitis, unspecified: Secondary | ICD-10-CM | POA: Diagnosis not present

## 2020-02-04 DIAGNOSIS — R197 Diarrhea, unspecified: Secondary | ICD-10-CM | POA: Diagnosis not present

## 2020-02-04 DIAGNOSIS — R109 Unspecified abdominal pain: Secondary | ICD-10-CM | POA: Diagnosis not present

## 2020-02-04 DIAGNOSIS — R634 Abnormal weight loss: Secondary | ICD-10-CM | POA: Diagnosis not present

## 2020-02-04 DIAGNOSIS — R112 Nausea with vomiting, unspecified: Secondary | ICD-10-CM | POA: Diagnosis not present

## 2020-02-04 DIAGNOSIS — K626 Ulcer of anus and rectum: Secondary | ICD-10-CM | POA: Diagnosis not present

## 2020-02-04 DIAGNOSIS — K5000 Crohn's disease of small intestine without complications: Secondary | ICD-10-CM | POA: Diagnosis not present

## 2020-02-04 DIAGNOSIS — K50919 Crohn's disease, unspecified, with unspecified complications: Secondary | ICD-10-CM | POA: Diagnosis not present

## 2020-02-06 DIAGNOSIS — K50919 Crohn's disease, unspecified, with unspecified complications: Secondary | ICD-10-CM | POA: Diagnosis not present

## 2020-02-06 DIAGNOSIS — R112 Nausea with vomiting, unspecified: Secondary | ICD-10-CM | POA: Diagnosis not present

## 2020-02-11 DIAGNOSIS — Z79891 Long term (current) use of opiate analgesic: Secondary | ICD-10-CM | POA: Diagnosis not present

## 2020-02-11 DIAGNOSIS — M7060 Trochanteric bursitis, unspecified hip: Secondary | ICD-10-CM | POA: Diagnosis not present

## 2020-02-11 DIAGNOSIS — G894 Chronic pain syndrome: Secondary | ICD-10-CM | POA: Diagnosis not present

## 2020-02-11 DIAGNOSIS — Z79899 Other long term (current) drug therapy: Secondary | ICD-10-CM | POA: Diagnosis not present

## 2020-02-20 DIAGNOSIS — R197 Diarrhea, unspecified: Secondary | ICD-10-CM | POA: Diagnosis not present

## 2020-02-20 DIAGNOSIS — K626 Ulcer of anus and rectum: Secondary | ICD-10-CM | POA: Diagnosis not present

## 2020-02-20 DIAGNOSIS — R112 Nausea with vomiting, unspecified: Secondary | ICD-10-CM | POA: Diagnosis not present

## 2020-02-20 DIAGNOSIS — K50919 Crohn's disease, unspecified, with unspecified complications: Secondary | ICD-10-CM | POA: Diagnosis not present

## 2020-03-31 DIAGNOSIS — G894 Chronic pain syndrome: Secondary | ICD-10-CM | POA: Diagnosis not present

## 2020-03-31 DIAGNOSIS — M7060 Trochanteric bursitis, unspecified hip: Secondary | ICD-10-CM | POA: Diagnosis not present

## 2020-03-31 DIAGNOSIS — M47817 Spondylosis without myelopathy or radiculopathy, lumbosacral region: Secondary | ICD-10-CM | POA: Diagnosis not present

## 2020-03-31 DIAGNOSIS — M5137 Other intervertebral disc degeneration, lumbosacral region: Secondary | ICD-10-CM | POA: Diagnosis not present

## 2020-04-02 DIAGNOSIS — K589 Irritable bowel syndrome without diarrhea: Secondary | ICD-10-CM | POA: Diagnosis not present

## 2020-04-02 DIAGNOSIS — E538 Deficiency of other specified B group vitamins: Secondary | ICD-10-CM | POA: Diagnosis not present

## 2020-04-02 DIAGNOSIS — R112 Nausea with vomiting, unspecified: Secondary | ICD-10-CM | POA: Diagnosis not present

## 2020-04-02 DIAGNOSIS — K9089 Other intestinal malabsorption: Secondary | ICD-10-CM | POA: Diagnosis not present

## 2020-04-07 DIAGNOSIS — K589 Irritable bowel syndrome without diarrhea: Secondary | ICD-10-CM | POA: Diagnosis not present

## 2020-04-07 DIAGNOSIS — K9089 Other intestinal malabsorption: Secondary | ICD-10-CM | POA: Diagnosis not present

## 2020-04-28 DIAGNOSIS — M47816 Spondylosis without myelopathy or radiculopathy, lumbar region: Secondary | ICD-10-CM | POA: Diagnosis not present

## 2020-04-28 DIAGNOSIS — M79651 Pain in right thigh: Secondary | ICD-10-CM | POA: Diagnosis not present

## 2020-04-28 DIAGNOSIS — G894 Chronic pain syndrome: Secondary | ICD-10-CM | POA: Diagnosis not present

## 2020-04-28 DIAGNOSIS — M5136 Other intervertebral disc degeneration, lumbar region: Secondary | ICD-10-CM | POA: Diagnosis not present

## 2020-04-28 DIAGNOSIS — Z79891 Long term (current) use of opiate analgesic: Secondary | ICD-10-CM | POA: Diagnosis not present

## 2020-04-28 DIAGNOSIS — Z79899 Other long term (current) drug therapy: Secondary | ICD-10-CM | POA: Diagnosis not present

## 2020-05-10 DIAGNOSIS — R1110 Vomiting, unspecified: Secondary | ICD-10-CM | POA: Diagnosis not present

## 2020-05-10 DIAGNOSIS — R110 Nausea: Secondary | ICD-10-CM | POA: Diagnosis not present

## 2020-05-13 DIAGNOSIS — R112 Nausea with vomiting, unspecified: Secondary | ICD-10-CM | POA: Diagnosis not present

## 2020-05-13 DIAGNOSIS — K50919 Crohn's disease, unspecified, with unspecified complications: Secondary | ICD-10-CM | POA: Diagnosis not present

## 2020-05-13 DIAGNOSIS — R7982 Elevated C-reactive protein (CRP): Secondary | ICD-10-CM | POA: Diagnosis not present

## 2020-05-13 DIAGNOSIS — E538 Deficiency of other specified B group vitamins: Secondary | ICD-10-CM | POA: Diagnosis not present

## 2020-05-13 DIAGNOSIS — K9089 Other intestinal malabsorption: Secondary | ICD-10-CM | POA: Diagnosis not present

## 2020-05-13 DIAGNOSIS — K5000 Crohn's disease of small intestine without complications: Secondary | ICD-10-CM | POA: Diagnosis not present

## 2020-05-22 DIAGNOSIS — E0590 Thyrotoxicosis, unspecified without thyrotoxic crisis or storm: Secondary | ICD-10-CM | POA: Diagnosis not present

## 2020-05-27 DIAGNOSIS — G894 Chronic pain syndrome: Secondary | ICD-10-CM | POA: Diagnosis not present

## 2020-05-27 DIAGNOSIS — M79651 Pain in right thigh: Secondary | ICD-10-CM | POA: Diagnosis not present

## 2020-05-27 DIAGNOSIS — M47816 Spondylosis without myelopathy or radiculopathy, lumbar region: Secondary | ICD-10-CM | POA: Diagnosis not present

## 2020-05-27 DIAGNOSIS — L400 Psoriasis vulgaris: Secondary | ICD-10-CM | POA: Diagnosis not present

## 2020-05-27 DIAGNOSIS — M5136 Other intervertebral disc degeneration, lumbar region: Secondary | ICD-10-CM | POA: Diagnosis not present

## 2020-06-08 ENCOUNTER — Ambulatory Visit: Payer: BC Managed Care – PPO | Admitting: Legal Medicine

## 2020-06-08 DIAGNOSIS — K50919 Crohn's disease, unspecified, with unspecified complications: Secondary | ICD-10-CM | POA: Diagnosis not present

## 2020-06-08 DIAGNOSIS — R112 Nausea with vomiting, unspecified: Secondary | ICD-10-CM | POA: Diagnosis not present

## 2020-06-08 DIAGNOSIS — R197 Diarrhea, unspecified: Secondary | ICD-10-CM | POA: Diagnosis not present

## 2020-06-08 DIAGNOSIS — R1084 Generalized abdominal pain: Secondary | ICD-10-CM | POA: Diagnosis not present

## 2020-06-26 DIAGNOSIS — K219 Gastro-esophageal reflux disease without esophagitis: Secondary | ICD-10-CM | POA: Diagnosis not present

## 2020-06-26 DIAGNOSIS — R112 Nausea with vomiting, unspecified: Secondary | ICD-10-CM | POA: Diagnosis not present

## 2020-06-26 DIAGNOSIS — K50918 Crohn's disease, unspecified, with other complication: Secondary | ICD-10-CM | POA: Diagnosis not present

## 2020-07-01 DIAGNOSIS — J45909 Unspecified asthma, uncomplicated: Secondary | ICD-10-CM | POA: Diagnosis not present

## 2020-07-01 DIAGNOSIS — A049 Bacterial intestinal infection, unspecified: Secondary | ICD-10-CM | POA: Diagnosis not present

## 2020-07-01 DIAGNOSIS — A0839 Other viral enteritis: Secondary | ICD-10-CM | POA: Diagnosis not present

## 2020-07-01 DIAGNOSIS — Z87891 Personal history of nicotine dependence: Secondary | ICD-10-CM | POA: Diagnosis not present

## 2020-07-01 DIAGNOSIS — E876 Hypokalemia: Secondary | ICD-10-CM | POA: Diagnosis not present

## 2020-07-01 DIAGNOSIS — U071 COVID-19: Secondary | ICD-10-CM | POA: Diagnosis not present

## 2020-07-01 DIAGNOSIS — K529 Noninfective gastroenteritis and colitis, unspecified: Secondary | ICD-10-CM | POA: Diagnosis not present

## 2020-07-01 DIAGNOSIS — Z79899 Other long term (current) drug therapy: Secondary | ICD-10-CM | POA: Diagnosis not present

## 2020-07-01 DIAGNOSIS — Z79891 Long term (current) use of opiate analgesic: Secondary | ICD-10-CM | POA: Diagnosis not present

## 2020-07-01 DIAGNOSIS — R1110 Vomiting, unspecified: Secondary | ICD-10-CM | POA: Diagnosis not present

## 2020-07-01 DIAGNOSIS — I10 Essential (primary) hypertension: Secondary | ICD-10-CM | POA: Diagnosis not present

## 2020-07-01 DIAGNOSIS — K5000 Crohn's disease of small intestine without complications: Secondary | ICD-10-CM | POA: Diagnosis not present

## 2020-07-01 DIAGNOSIS — E7800 Pure hypercholesterolemia, unspecified: Secondary | ICD-10-CM | POA: Diagnosis not present

## 2020-07-01 DIAGNOSIS — E860 Dehydration: Secondary | ICD-10-CM | POA: Diagnosis not present

## 2020-07-01 DIAGNOSIS — M1990 Unspecified osteoarthritis, unspecified site: Secondary | ICD-10-CM | POA: Diagnosis not present

## 2020-07-08 DIAGNOSIS — M5136 Other intervertebral disc degeneration, lumbar region: Secondary | ICD-10-CM | POA: Diagnosis not present

## 2020-07-08 DIAGNOSIS — Z79891 Long term (current) use of opiate analgesic: Secondary | ICD-10-CM | POA: Diagnosis not present

## 2020-07-08 DIAGNOSIS — Z79899 Other long term (current) drug therapy: Secondary | ICD-10-CM | POA: Diagnosis not present

## 2020-07-08 DIAGNOSIS — G894 Chronic pain syndrome: Secondary | ICD-10-CM | POA: Diagnosis not present

## 2020-07-08 DIAGNOSIS — M7060 Trochanteric bursitis, unspecified hip: Secondary | ICD-10-CM | POA: Diagnosis not present

## 2020-07-08 DIAGNOSIS — M47816 Spondylosis without myelopathy or radiculopathy, lumbar region: Secondary | ICD-10-CM | POA: Diagnosis not present

## 2020-07-10 DIAGNOSIS — R519 Headache, unspecified: Secondary | ICD-10-CM | POA: Diagnosis not present

## 2020-07-10 DIAGNOSIS — J029 Acute pharyngitis, unspecified: Secondary | ICD-10-CM | POA: Diagnosis not present

## 2020-07-24 DIAGNOSIS — R059 Cough, unspecified: Secondary | ICD-10-CM | POA: Diagnosis not present

## 2020-07-24 DIAGNOSIS — R0989 Other specified symptoms and signs involving the circulatory and respiratory systems: Secondary | ICD-10-CM | POA: Diagnosis not present

## 2020-07-24 DIAGNOSIS — K5080 Crohn's disease of both small and large intestine without complications: Secondary | ICD-10-CM | POA: Diagnosis not present

## 2020-07-24 DIAGNOSIS — E0590 Thyrotoxicosis, unspecified without thyrotoxic crisis or storm: Secondary | ICD-10-CM | POA: Diagnosis not present

## 2020-07-24 DIAGNOSIS — H9193 Unspecified hearing loss, bilateral: Secondary | ICD-10-CM | POA: Diagnosis not present

## 2020-07-24 DIAGNOSIS — R062 Wheezing: Secondary | ICD-10-CM | POA: Diagnosis not present

## 2020-08-04 DIAGNOSIS — M7060 Trochanteric bursitis, unspecified hip: Secondary | ICD-10-CM | POA: Diagnosis not present

## 2020-08-11 DIAGNOSIS — E0590 Thyrotoxicosis, unspecified without thyrotoxic crisis or storm: Secondary | ICD-10-CM | POA: Diagnosis not present

## 2020-08-11 DIAGNOSIS — H903 Sensorineural hearing loss, bilateral: Secondary | ICD-10-CM | POA: Diagnosis not present

## 2020-08-11 DIAGNOSIS — H9193 Unspecified hearing loss, bilateral: Secondary | ICD-10-CM | POA: Diagnosis not present

## 2020-09-01 DIAGNOSIS — M5136 Other intervertebral disc degeneration, lumbar region: Secondary | ICD-10-CM | POA: Diagnosis not present

## 2020-09-01 DIAGNOSIS — Z79891 Long term (current) use of opiate analgesic: Secondary | ICD-10-CM | POA: Diagnosis not present

## 2020-09-01 DIAGNOSIS — Z79899 Other long term (current) drug therapy: Secondary | ICD-10-CM | POA: Diagnosis not present

## 2020-09-01 DIAGNOSIS — G894 Chronic pain syndrome: Secondary | ICD-10-CM | POA: Diagnosis not present

## 2020-09-01 DIAGNOSIS — G5600 Carpal tunnel syndrome, unspecified upper limb: Secondary | ICD-10-CM | POA: Diagnosis not present

## 2020-09-01 DIAGNOSIS — M7060 Trochanteric bursitis, unspecified hip: Secondary | ICD-10-CM | POA: Diagnosis not present

## 2020-09-08 ENCOUNTER — Ambulatory Visit (INDEPENDENT_AMBULATORY_CARE_PROVIDER_SITE_OTHER): Payer: BC Managed Care – PPO | Admitting: Endocrinology

## 2020-09-08 ENCOUNTER — Other Ambulatory Visit: Payer: Self-pay

## 2020-09-08 VITALS — BP 132/70 | HR 77 | Ht 67.0 in | Wt 186.2 lb

## 2020-09-08 DIAGNOSIS — E0590 Thyrotoxicosis, unspecified without thyrotoxic crisis or storm: Secondary | ICD-10-CM | POA: Diagnosis not present

## 2020-09-08 DIAGNOSIS — G5600 Carpal tunnel syndrome, unspecified upper limb: Secondary | ICD-10-CM | POA: Diagnosis not present

## 2020-09-08 DIAGNOSIS — R7989 Other specified abnormal findings of blood chemistry: Secondary | ICD-10-CM | POA: Diagnosis not present

## 2020-09-08 LAB — T4, FREE: Free T4: 0.81 ng/dL (ref 0.60–1.60)

## 2020-09-08 LAB — TSH: TSH: 1.21 u[IU]/mL (ref 0.35–5.50)

## 2020-09-08 NOTE — Progress Notes (Signed)
Subjective:    Patient ID: [name], female    DOB: Nov 15, 1960, 60 y.o.   MRN: [id number]  HPI Pt is ref by Dr Vertell Limber, for hyperthyroidism.  I last saw this pt in 2015.  she was dx'ed with hyperthyroidism in 2010.  she was rx'ed low-dose tapazole, as it was mild; she has never had XRT to the anterior neck, or thyroid surgery.  she has never had dedicated thyroid imaging.  she does not consume kelp or any other non-prescribed thyroid medication.  she has never been on amiodarone.  She stopped tapazole 5 mg QD, in 3/22.  She reports palpitations, insomnia, anxiety, heat intolerance, sweating, and tremor.   Past Medical History:  Diagnosis Date   Arthritis    Chronic diarrhea    Chronic disease anemia    Crohn's disease ([place])    Depression    Family history of malignant neoplasm of gastrointestinal tract    GERD (gastroesophageal reflux disease)    H/O hiatal hernia    History of adenomatous polyp of colon    History of gastritis    AND HX ILEITIS   History of kidney stones    History of small bowel obstruction    SECONDARY CROHN'S STRICTURE  S/P COLECTOMY   Hyperlipidemia    Hypertension    Hypopotassemia    Internal hemorrhoids    Kidney stones    Pancreatic insufficiency    PONV (postoperative nausea and vomiting)    Psoriasis    SKIN   Right ureteral stone    Rotavirus infection 05/31/2013   S/P dilatation of esophageal stricture    Seasonal asthma    Urgency of urination    Vitamin B12 deficiency     Past Surgical History:  Procedure Laterality Date   ANAL FISSURE REPAIR  1990's   CARPAL TUNNEL RELEASE Right 2000   CHOLECYSTECTOMY  2002   CYSTOSCOPY WITH RETROGRADE PYELOGRAM, URETEROSCOPY AND STENT PLACEMENT Left 03/08/2013   Procedure: CYSTOSCOPY WITH RETROGRADE PYELOGRAM, URETEROSCOPY AND STENT PLACEMENT stone basketing;  Surgeon: Alexis Frock, MD;  Location: [facility];  Service: Urology;  Laterality: Left;   CYSTOSCOPY WITH RETROGRADE PYELOGRAM, URETEROSCOPY  AND STENT PLACEMENT Right 04/03/2013   Procedure: CYSTOSCOPY WITH RETROGRADE PYELOGRAM, URETEROSCOPY AND STENT PLACEMENT;  Surgeon: Alexis Frock, MD;  Location: [facility];  Service: Urology;  Laterality: Right;   DX LAPAROSCOPY CONVERTED TO OPEN RIGHT COLECCTOMY  09-15-2010   ANASTOMOTIC STRICTURE   LAPAROSCOPIC ILEOCECECTOMY  10-09-2008   AND APPENDECTOMY (TERMINAL ILEITIS & PARTIAL SBO)   SPINAL CORD STIMULATOR IMPLANT Left 05/2017   VAGINAL HYSTERECTOMY  1992    Social History   Socioeconomic History   Marital status: Married    Spouse name: Not on file   Number of children: 2   Years of education: Not on file   Highest education level: Not on file  Occupational History   Occupation: unemployed    Employer: [employer]  Tobacco Use   Smoking status: Former    Packs/day: 1.00    Years: 10.00    Pack years: 10.00    Types: Cigarettes    Quit date: 03/07/1984    Years since quitting: 36.5   Smokeless tobacco: Never  Vaping Use   Vaping Use: Never used  Substance and Sexual Activity   Alcohol use: No    Alcohol/week: 0.0 standard drinks   Drug use: No   Sexual activity: Not on file  Other Topics Concern   Not on file  Social History Narrative   Not on file   Social Determinants of Health   Financial Resource Strain: Not on file  Food Insecurity: Not on file  Transportation Needs: Not on file  Physical Activity: Not on file  Stress: Not on file  Social Connections: Not on file  Intimate Partner Violence: Not on file    Current Outpatient Medications on File Prior to Visit  Medication Sig Dispense Refill   albuterol (PROAIR HFA) 108 (90 BASE) MCG/ACT inhaler Inhale 2 puffs into the lungs every 4 (four) hours as needed for wheezing or shortness of breath.      colestipol (COLESTID) 1 g tablet Take 1 tablet (1 g total) by mouth 3 (three) times daily. 90 tablet 6   cyanocobalamin (,VITAMIN B-12,) 1000 MCG/ML injection Inject 56m SQ once every 7 days x  4 then inject 1 ml SQ every 28 days. 1 mL 0   dronabinol (MARINOL) 2.5 MG capsule Take 2.5 mg by mouth 2 (two) times daily.     econazole nitrate 1 % cream APPLY  CREAM TOPICALLY ONCE DAILY (Patient taking differently: Apply 1 application topically daily. ) 30 g 0   gabapentin (NEURONTIN) 400 MG capsule Take 400-1,200 mg by mouth 3 (three) times daily. Take 1 capsule by mouth at breakfast, 2 at lunch and 3 at dinner     hydrocortisone (CORTEF) 5 MG tablet Take 5 mg by mouth 2 (two) times daily.     hyoscyamine (LEVSIN SL) 0.125 MG SL tablet Place 0.125 mg under the tongue 4 (four) times daily.     levETIRAcetam (KEPPRA) 250 MG tablet Take 250 mg by mouth 2 (two) times daily.     lipase/protease/amylase (CREON) 36000 UNITS CPEP capsule Take 2  ( 72,000units) by mouth three times a day with meals one (36,000units) with snacks This is a dose increase for this -patient 210 capsule 11   Multiple Vitamin (MULTIVITAMIN WITH MINERALS) TABS tablet Take 1 tablet by mouth daily. 30 tablet 0   Needles & Syringes MISC 1 Syringe by Does not apply route every 30 (thirty) days. 15 each 0   omeprazole (PRILOSEC) 40 MG capsule Take 1 capsule (40 mg total) by mouth daily. 90 capsule 3   Oxycodone HCl 10 MG TABS Take 10 mg by mouth 5 (five) times daily as needed (pain). 1 tablet up to five times a day as needed for severe pain.      Potassium Chloride ER 20 MEQ TBCR Take 20 mEq by mouth 2 (two) times daily for 2 days. 4 tablet 0   pramoxine-hydrocortisone (PROCTOCREAM-HC) 1-1 % rectal cream Place 1 application rectally 2 (two) times daily. 30 g 1   promethazine (PHENERGAN) 25 MG suppository Place 1 suppository (25 mg total) rectally every 6 (six) hours as needed for nausea or vomiting. 30 each 0   promethazine (PHENERGAN) 25 MG tablet TAKE 1 TABLET BY MOUTH ONCE DAILY AS NEEDED FOR NAUSEA AND VOMITING 30 tablet 0   propranolol ER (INDERAL LA) 80 MG 24 hr capsule Take 1 capsule by mouth daily.     traZODone (DESYREL) 50  MG tablet Take 50 mg by mouth at bedtime as needed for sleep.      vedolizumab (ENTYVIO) 300 MG injection Inject 300 mg into the vein every 8 (eight) weeks.     VITAMIN D PO Take 1 tablet by mouth daily.     zolmitriptan (ZOMIG) 5 MG nasal solution 1 spray in one nostril.  May repeat 1 spray once  after 2 hours if needed. Not to exceed 2 sprays in 24 hours (Patient taking differently: Place 1 spray into the nose daily as needed for migraine. 1 spray in one nostril.  May repeat 1 spray once after 2 hours if needed. Not to exceed 2 sprays in 24 hours) 6 Units 0   No current facility-administered medications on file prior to visit.    Allergies  Allergen Reactions   Codeine Hives and Nausea And Vomiting   Amitriptyline Itching and Hives   Humira [Adalimumab] Rash    Family History  Problem Relation Age of Onset   Colon cancer Mother    Colon polyps Mother    Colon polyps Father    Lung cancer Father    Breast cancer Paternal Grandmother    Colon polyps Brother    Thyroid disease Neg Hx    Stomach cancer Neg Hx    Pancreatic cancer Neg Hx     BP 132/70 (BP Location: Right Arm, Patient Position: Sitting, Cuff Size: Large)   Pulse 77   Ht 5' 7"  (1.702 m)   Wt 186 lb 3.2 oz (84.5 kg)   SpO2 97%   BMI 29.16 kg/m    Review of Systems denies weight loss, fever, and sob.     Objective:   Physical Exam GEN: no distress HEAD: head: no deformity eyes: no periorbital swelling, no proptosis external nose and ears are normal NECK: supple, thyroid is not enlarged CHEST WALL: no deformity LUNGS: clear to auscultation.  Exp wheezes (pt says chronic).   CV: reg rate and rhythm, no murmur.  MUSCULOSKELETAL: gait is normal and steady EXTEMITIES: no deformity.  no leg edema NEURO:  readily moves all 4's.  sensation is intact to touch on all 4's.  Mod tremor of the hands SKIN:  Normal texture and temperature.  No rash or suspicious lesion is visible.  Not diaphoretic.  NODES:  None  palpable at the neck PSYCH: alert, well-oriented.  Does not appear anxious nor depressed.   CT (2019): thyroid is normal  I have reviewed outside records, and summarized: Pt was noted to have abnormal TFT, and referred here.  Wheezing, depression, and Crohn's Dz were also noted     Assessment & Plan:  Hyperthyroidism, by hx.    Patient Instructions  Blood tests are requested for you today.  We'll let you know about the results.  If it is overactive today, we'll resume the methimazole at 1/2 pill per day.  Please recheck the blood in 1 month.   Please come back for a follow-up appointment in 2 months.

## 2020-09-08 NOTE — Patient Instructions (Signed)
Blood tests are requested for you today.  We'll let you know about the results.  If it is overactive today, we'll resume the methimazole at 1/2 pill per day.  Please recheck the blood in 1 month.   Please come back for a follow-up appointment in 2 months.

## 2020-09-20 DIAGNOSIS — R1110 Vomiting, unspecified: Secondary | ICD-10-CM | POA: Diagnosis not present

## 2020-09-20 DIAGNOSIS — R1010 Upper abdominal pain, unspecified: Secondary | ICD-10-CM | POA: Diagnosis not present

## 2020-09-23 DIAGNOSIS — R112 Nausea with vomiting, unspecified: Secondary | ICD-10-CM | POA: Diagnosis not present

## 2020-09-23 DIAGNOSIS — K591 Functional diarrhea: Secondary | ICD-10-CM | POA: Diagnosis not present

## 2020-09-23 DIAGNOSIS — K219 Gastro-esophageal reflux disease without esophagitis: Secondary | ICD-10-CM | POA: Diagnosis not present

## 2020-09-23 DIAGNOSIS — K9089 Other intestinal malabsorption: Secondary | ICD-10-CM | POA: Diagnosis not present

## 2020-09-29 DIAGNOSIS — M5136 Other intervertebral disc degeneration, lumbar region: Secondary | ICD-10-CM | POA: Diagnosis not present

## 2020-09-29 DIAGNOSIS — G5600 Carpal tunnel syndrome, unspecified upper limb: Secondary | ICD-10-CM | POA: Diagnosis not present

## 2020-09-29 DIAGNOSIS — M47816 Spondylosis without myelopathy or radiculopathy, lumbar region: Secondary | ICD-10-CM | POA: Diagnosis not present

## 2020-09-29 DIAGNOSIS — G894 Chronic pain syndrome: Secondary | ICD-10-CM | POA: Diagnosis not present

## 2020-10-01 DIAGNOSIS — R197 Diarrhea, unspecified: Secondary | ICD-10-CM | POA: Diagnosis not present

## 2020-10-01 DIAGNOSIS — R112 Nausea with vomiting, unspecified: Secondary | ICD-10-CM | POA: Diagnosis not present

## 2020-10-19 ENCOUNTER — Other Ambulatory Visit: Payer: Self-pay

## 2020-10-19 ENCOUNTER — Other Ambulatory Visit (INDEPENDENT_AMBULATORY_CARE_PROVIDER_SITE_OTHER): Payer: BC Managed Care – PPO

## 2020-10-19 DIAGNOSIS — E0590 Thyrotoxicosis, unspecified without thyrotoxic crisis or storm: Secondary | ICD-10-CM

## 2020-10-19 LAB — TSH: TSH: 0.91 u[IU]/mL (ref 0.35–5.50)

## 2020-10-19 LAB — T4, FREE: Free T4: 0.79 ng/dL (ref 0.60–1.60)

## 2020-10-27 DIAGNOSIS — M47816 Spondylosis without myelopathy or radiculopathy, lumbar region: Secondary | ICD-10-CM | POA: Diagnosis not present

## 2020-10-27 DIAGNOSIS — M25519 Pain in unspecified shoulder: Secondary | ICD-10-CM | POA: Diagnosis not present

## 2020-10-27 DIAGNOSIS — M5136 Other intervertebral disc degeneration, lumbar region: Secondary | ICD-10-CM | POA: Diagnosis not present

## 2020-10-27 DIAGNOSIS — G894 Chronic pain syndrome: Secondary | ICD-10-CM | POA: Diagnosis not present

## 2020-11-24 ENCOUNTER — Ambulatory Visit (INDEPENDENT_AMBULATORY_CARE_PROVIDER_SITE_OTHER): Payer: BC Managed Care – PPO | Admitting: Endocrinology

## 2020-11-24 ENCOUNTER — Other Ambulatory Visit: Payer: Self-pay

## 2020-11-24 VITALS — BP 124/50 | HR 89 | Ht 67.0 in | Wt 187.4 lb

## 2020-11-24 DIAGNOSIS — R002 Palpitations: Secondary | ICD-10-CM | POA: Diagnosis not present

## 2020-11-24 DIAGNOSIS — M47816 Spondylosis without myelopathy or radiculopathy, lumbar region: Secondary | ICD-10-CM | POA: Diagnosis not present

## 2020-11-24 DIAGNOSIS — E0590 Thyrotoxicosis, unspecified without thyrotoxic crisis or storm: Secondary | ICD-10-CM | POA: Diagnosis not present

## 2020-11-24 DIAGNOSIS — M25519 Pain in unspecified shoulder: Secondary | ICD-10-CM | POA: Diagnosis not present

## 2020-11-24 DIAGNOSIS — M5136 Other intervertebral disc degeneration, lumbar region: Secondary | ICD-10-CM | POA: Diagnosis not present

## 2020-11-24 DIAGNOSIS — G894 Chronic pain syndrome: Secondary | ICD-10-CM | POA: Diagnosis not present

## 2020-11-24 LAB — T4, FREE: Free T4: 1.01 ng/dL (ref 0.60–1.60)

## 2020-11-24 LAB — TSH: TSH: 0.85 u[IU]/mL (ref 0.35–5.50)

## 2020-11-24 NOTE — Patient Instructions (Addendum)
Blood tests are requested for you today.  We'll let you know about the results.  Please see a heart specialist.  you will receive a phone call, about a day and time for an appointment. Please come back for a follow-up appointment in 4 months.

## 2020-11-24 NOTE — Progress Notes (Signed)
Subjective:    Patient ID: [name], female    DOB: 1960/11/07, 60 y.o.   MRN: [id number]  HPI Pt returns for f/u of hyperthyroidism (she was dx'ed with hyperthyroidism in 2010; she was rx'ed tapazole; she has never had dedicated thyroid imaging; she stopped tapazole 5 mg QD, in 3/22).  She again reports palpitations, heat intolerance, and emotional lability.   Past Medical History:  Diagnosis Date   Arthritis    Chronic diarrhea    Chronic disease anemia    Crohn's disease ([place])    Depression    Family history of malignant neoplasm of gastrointestinal tract    GERD (gastroesophageal reflux disease)    H/O hiatal hernia    History of adenomatous polyp of colon    History of gastritis    AND HX ILEITIS   History of kidney stones    History of small bowel obstruction    SECONDARY CROHN'S STRICTURE  S/P COLECTOMY   Hyperlipidemia    Hypertension    Hypopotassemia    Internal hemorrhoids    Kidney stones    Pancreatic insufficiency    PONV (postoperative nausea and vomiting)    Psoriasis    SKIN   Right ureteral stone    Rotavirus infection 05/31/2013   S/P dilatation of esophageal stricture    Seasonal asthma    Urgency of urination    Vitamin B12 deficiency     Past Surgical History:  Procedure Laterality Date   ANAL FISSURE REPAIR  1990's   CARPAL TUNNEL RELEASE Right 2000   CHOLECYSTECTOMY  2002   CYSTOSCOPY WITH RETROGRADE PYELOGRAM, URETEROSCOPY AND STENT PLACEMENT Left 03/08/2013   Procedure: CYSTOSCOPY WITH RETROGRADE PYELOGRAM, URETEROSCOPY AND STENT PLACEMENT stone basketing;  Surgeon: Alexis Frock, MD;  Location: [facility];  Service: Urology;  Laterality: Left;   CYSTOSCOPY WITH RETROGRADE PYELOGRAM, URETEROSCOPY AND STENT PLACEMENT Right 04/03/2013   Procedure: CYSTOSCOPY WITH RETROGRADE PYELOGRAM, URETEROSCOPY AND STENT PLACEMENT;  Surgeon: Alexis Frock, MD;  [facility];  Service: Urology;  Laterality: Right;   DX LAPAROSCOPY  CONVERTED TO OPEN RIGHT COLECCTOMY  09-15-2010   ANASTOMOTIC STRICTURE   LAPAROSCOPIC ILEOCECECTOMY  10-09-2008   AND APPENDECTOMY (TERMINAL ILEITIS & PARTIAL SBO)   SPINAL CORD STIMULATOR IMPLANT Left 05/2017   VAGINAL HYSTERECTOMY  1992    Social History   Socioeconomic History   Marital status: Married    Spouse name: Not on file   Number of children: 2   Years of education: Not on file   Highest education level: Not on file  Occupational History   Occupation: unemployed    Employer: [employer]  Tobacco Use   Smoking status: Former    Packs/day: 1.00    Years: 10.00    Pack years: 10.00    Types: Cigarettes    Quit date: 03/07/1984    Years since quitting: 36.7   Smokeless tobacco: Never  Vaping Use   Vaping Use: Never used  Substance and Sexual Activity   Alcohol use: No    Alcohol/week: 0.0 standard drinks   Drug use: No   Sexual activity: Not on file  Other Topics Concern   Not on file  Social History Narrative   Not on file   Social Determinants of Health   Financial Resource Strain: Not on file  Food Insecurity: Not on file  Transportation Needs: Not on file  Physical Activity: Not on file  Stress: Not on file  Social Connections: Not on  file  Intimate Partner Violence: Not on file    Current Outpatient Medications on File Prior to Visit  Medication Sig Dispense Refill   albuterol (VENTOLIN HFA) 108 (90 Base) MCG/ACT inhaler Inhale 2 puffs into the lungs every 4 (four) hours as needed for wheezing or shortness of breath.      colestipol (COLESTID) 1 g tablet Take 1 tablet (1 g total) by mouth 3 (three) times daily. 90 tablet 6   cyanocobalamin (,VITAMIN B-12,) 1000 MCG/ML injection Inject 38m SQ once every 7 days x 4 then inject 1 ml SQ every 28 days. 1 mL 0   dronabinol (MARINOL) 2.5 MG capsule Take 2.5 mg by mouth 2 (two) times daily.     econazole nitrate 1 % cream APPLY  CREAM TOPICALLY ONCE DAILY (Patient taking differently: Apply 1 application  topically daily.) 30 g 0   gabapentin (NEURONTIN) 400 MG capsule Take 400-1,200 mg by mouth 3 (three) times daily. Take 1 capsule by mouth at breakfast, 2 at lunch and 3 at dinner     hyoscyamine (LEVSIN SL) 0.125 MG SL tablet Place 0.125 mg under the tongue 4 (four) times daily.     levETIRAcetam (KEPPRA) 250 MG tablet Take 250 mg by mouth 2 (two) times daily.     lipase/protease/amylase (CREON) 36000 UNITS CPEP capsule Take 2  ( 72,000units) by mouth three times a day with meals one (36,000units) with snacks This is a dose increase for this -patient 210 capsule 11   Multiple Vitamin (MULTIVITAMIN WITH MINERALS) TABS tablet Take 1 tablet by mouth daily. 30 tablet 0   Needles & Syringes MISC 1 Syringe by Does not apply route every 30 (thirty) days. 15 each 0   omeprazole (PRILOSEC) 40 MG capsule Take 1 capsule (40 mg total) by mouth daily. 90 capsule 3   Oxycodone HCl 10 MG TABS Take 10 mg by mouth 5 (five) times daily as needed (pain). 1 tablet up to five times a day as needed for severe pain.      pramoxine-hydrocortisone (PROCTOCREAM-HC) 1-1 % rectal cream Place 1 application rectally 2 (two) times daily. 30 g 1   promethazine (PHENERGAN) 25 MG suppository Place 1 suppository (25 mg total) rectally every 6 (six) hours as needed for nausea or vomiting. 30 each 0   promethazine (PHENERGAN) 25 MG tablet TAKE 1 TABLET BY MOUTH ONCE DAILY AS NEEDED FOR NAUSEA AND VOMITING 30 tablet 0   propranolol ER (INDERAL LA) 80 MG 24 hr capsule Take 1 capsule by mouth daily.     traZODone (DESYREL) 50 MG tablet Take 50 mg by mouth at bedtime as needed for sleep.      vedolizumab (ENTYVIO) 300 MG injection Inject 300 mg into the vein every 8 (eight) weeks.     VITAMIN D PO Take 1 tablet by mouth daily.     zolmitriptan (ZOMIG) 5 MG nasal solution 1 spray in one nostril.  May repeat 1 spray once after 2 hours if needed. Not to exceed 2 sprays in 24 hours (Patient taking differently: Place 1 spray into the nose daily  as needed for migraine. 1 spray in one nostril.  May repeat 1 spray once after 2 hours if needed. Not to exceed 2 sprays in 24 hours) 6 Units 0   hydrocortisone (CORTEF) 5 MG tablet Take 5 mg by mouth 2 (two) times daily.     Potassium Chloride ER 20 MEQ TBCR Take 20 mEq by mouth 2 (two) times daily for 2 days.  4 tablet 0   No current facility-administered medications on file prior to visit.    Allergies  Allergen Reactions   Codeine Hives and Nausea And Vomiting   Amitriptyline Itching and Hives   Humira [Adalimumab] Rash    Family History  Problem Relation Age of Onset   Colon cancer Mother    Colon polyps Mother    Colon polyps Father    Lung cancer Father    Breast cancer Paternal Grandmother    Colon polyps Brother    Thyroid disease Neg Hx    Stomach cancer Neg Hx    Pancreatic cancer Neg Hx     BP (!) 124/50 (BP Location: Right Arm, Patient Position: Sitting, Cuff Size: Large)   Pulse 89   Ht 5' 7"  (1.702 m)   Wt 187 lb 6.4 oz (85 kg)   SpO2 95%   BMI 29.35 kg/m   Review of Systems     Objective:   Physical Exam NECK: There is no palpable thyroid enlargement.  No thyroid nodule is palpable.  No palpable lymphadenopathy at the anterior neck.    Lab Results  Component Value Date   TSH 0.85 11/24/2020   T3TOTAL 79.1 (L) 07/29/2008      Assessment & Plan:  Hyperthyroidism:still euthyroid off tapazole.  I told pt that we do not need to resume tapazole now, but we may need to resume in the future.

## 2020-12-24 DIAGNOSIS — R1114 Bilious vomiting: Secondary | ICD-10-CM | POA: Diagnosis not present

## 2020-12-24 DIAGNOSIS — K602 Anal fissure, unspecified: Secondary | ICD-10-CM | POA: Diagnosis not present

## 2020-12-24 DIAGNOSIS — K625 Hemorrhage of anus and rectum: Secondary | ICD-10-CM | POA: Diagnosis not present

## 2020-12-24 DIAGNOSIS — R1013 Epigastric pain: Secondary | ICD-10-CM | POA: Diagnosis not present

## 2020-12-29 DIAGNOSIS — G894 Chronic pain syndrome: Secondary | ICD-10-CM | POA: Diagnosis not present

## 2020-12-29 DIAGNOSIS — M47816 Spondylosis without myelopathy or radiculopathy, lumbar region: Secondary | ICD-10-CM | POA: Diagnosis not present

## 2020-12-29 DIAGNOSIS — M7060 Trochanteric bursitis, unspecified hip: Secondary | ICD-10-CM | POA: Diagnosis not present

## 2020-12-29 DIAGNOSIS — M5136 Other intervertebral disc degeneration, lumbar region: Secondary | ICD-10-CM | POA: Diagnosis not present

## 2021-01-09 DIAGNOSIS — R197 Diarrhea, unspecified: Secondary | ICD-10-CM | POA: Diagnosis not present

## 2021-01-09 DIAGNOSIS — R1110 Vomiting, unspecified: Secondary | ICD-10-CM | POA: Diagnosis not present

## 2021-01-09 DIAGNOSIS — R112 Nausea with vomiting, unspecified: Secondary | ICD-10-CM | POA: Diagnosis not present

## 2021-01-25 ENCOUNTER — Ambulatory Visit: Payer: Medicare Other | Admitting: Endocrinology

## 2021-01-27 DIAGNOSIS — M5136 Other intervertebral disc degeneration, lumbar region: Secondary | ICD-10-CM | POA: Diagnosis not present

## 2021-01-27 DIAGNOSIS — M7060 Trochanteric bursitis, unspecified hip: Secondary | ICD-10-CM | POA: Diagnosis not present

## 2021-01-27 DIAGNOSIS — G894 Chronic pain syndrome: Secondary | ICD-10-CM | POA: Diagnosis not present

## 2021-01-27 DIAGNOSIS — M7061 Trochanteric bursitis, right hip: Secondary | ICD-10-CM | POA: Diagnosis not present

## 2021-01-27 DIAGNOSIS — M47816 Spondylosis without myelopathy or radiculopathy, lumbar region: Secondary | ICD-10-CM | POA: Diagnosis not present

## 2021-03-04 DIAGNOSIS — Z79899 Other long term (current) drug therapy: Secondary | ICD-10-CM | POA: Diagnosis not present

## 2021-03-04 DIAGNOSIS — M87059 Idiopathic aseptic necrosis of unspecified femur: Secondary | ICD-10-CM | POA: Diagnosis not present

## 2021-03-04 DIAGNOSIS — G894 Chronic pain syndrome: Secondary | ICD-10-CM | POA: Diagnosis not present

## 2021-03-04 DIAGNOSIS — G5600 Carpal tunnel syndrome, unspecified upper limb: Secondary | ICD-10-CM | POA: Diagnosis not present

## 2021-03-04 DIAGNOSIS — M47817 Spondylosis without myelopathy or radiculopathy, lumbosacral region: Secondary | ICD-10-CM | POA: Diagnosis not present

## 2021-03-04 DIAGNOSIS — Z79891 Long term (current) use of opiate analgesic: Secondary | ICD-10-CM | POA: Diagnosis not present

## 2021-04-17 DIAGNOSIS — K591 Functional diarrhea: Secondary | ICD-10-CM | POA: Diagnosis not present

## 2021-04-17 DIAGNOSIS — R1084 Generalized abdominal pain: Secondary | ICD-10-CM | POA: Diagnosis not present

## 2021-04-17 DIAGNOSIS — R1110 Vomiting, unspecified: Secondary | ICD-10-CM | POA: Diagnosis not present

## 2021-04-19 DIAGNOSIS — M7061 Trochanteric bursitis, right hip: Secondary | ICD-10-CM | POA: Diagnosis not present

## 2021-04-19 DIAGNOSIS — M5136 Other intervertebral disc degeneration, lumbar region: Secondary | ICD-10-CM | POA: Diagnosis not present

## 2021-04-19 DIAGNOSIS — G894 Chronic pain syndrome: Secondary | ICD-10-CM | POA: Diagnosis not present

## 2021-04-19 DIAGNOSIS — G5600 Carpal tunnel syndrome, unspecified upper limb: Secondary | ICD-10-CM | POA: Diagnosis not present

## 2021-05-17 DIAGNOSIS — M545 Low back pain, unspecified: Secondary | ICD-10-CM | POA: Diagnosis not present

## 2021-05-22 DIAGNOSIS — R110 Nausea: Secondary | ICD-10-CM | POA: Diagnosis not present

## 2021-05-25 DIAGNOSIS — R1114 Bilious vomiting: Secondary | ICD-10-CM | POA: Diagnosis not present

## 2021-05-25 DIAGNOSIS — K6289 Other specified diseases of anus and rectum: Secondary | ICD-10-CM | POA: Diagnosis not present

## 2021-05-25 DIAGNOSIS — K602 Anal fissure, unspecified: Secondary | ICD-10-CM | POA: Diagnosis not present

## 2021-05-25 DIAGNOSIS — L409 Psoriasis, unspecified: Secondary | ICD-10-CM | POA: Diagnosis not present

## 2021-05-25 DIAGNOSIS — E538 Deficiency of other specified B group vitamins: Secondary | ICD-10-CM | POA: Diagnosis not present

## 2021-05-25 DIAGNOSIS — R634 Abnormal weight loss: Secondary | ICD-10-CM | POA: Diagnosis not present

## 2021-06-16 ENCOUNTER — Other Ambulatory Visit: Payer: Self-pay | Admitting: Pain Medicine

## 2021-06-16 ENCOUNTER — Ambulatory Visit
Admission: RE | Admit: 2021-06-16 | Discharge: 2021-06-16 | Disposition: A | Discharge status: Home / Self Care | Payer: Medicare Other | Source: Ambulatory Visit | Attending: Pain Medicine | Admitting: Pain Medicine

## 2021-06-16 DIAGNOSIS — M180 Bilateral primary osteoarthritis of first carpometacarpal joints: Secondary | ICD-10-CM

## 2021-06-16 DIAGNOSIS — M19041 Primary osteoarthritis, right hand: Secondary | ICD-10-CM | POA: Diagnosis not present

## 2021-06-16 DIAGNOSIS — Z79891 Long term (current) use of opiate analgesic: Secondary | ICD-10-CM | POA: Diagnosis not present

## 2021-06-16 DIAGNOSIS — M19042 Primary osteoarthritis, left hand: Secondary | ICD-10-CM | POA: Diagnosis not present

## 2021-06-30 DIAGNOSIS — K5000 Crohn's disease of small intestine without complications: Secondary | ICD-10-CM | POA: Diagnosis not present

## 2021-06-30 DIAGNOSIS — M1811 Unilateral primary osteoarthritis of first carpometacarpal joint, right hand: Secondary | ICD-10-CM | POA: Diagnosis not present

## 2021-06-30 DIAGNOSIS — K50919 Crohn's disease, unspecified, with unspecified complications: Secondary | ICD-10-CM | POA: Diagnosis not present

## 2021-07-14 DIAGNOSIS — M1990 Unspecified osteoarthritis, unspecified site: Secondary | ICD-10-CM | POA: Diagnosis not present

## 2021-08-02 DIAGNOSIS — R1084 Generalized abdominal pain: Secondary | ICD-10-CM | POA: Diagnosis not present

## 2021-08-02 DIAGNOSIS — K591 Functional diarrhea: Secondary | ICD-10-CM | POA: Diagnosis not present

## 2021-08-02 DIAGNOSIS — R112 Nausea with vomiting, unspecified: Secondary | ICD-10-CM | POA: Diagnosis not present

## 2021-08-04 ENCOUNTER — Other Ambulatory Visit: Payer: Self-pay | Admitting: Pain Medicine

## 2021-08-04 ENCOUNTER — Ambulatory Visit
Admission: RE | Admit: 2021-08-04 | Discharge: 2021-08-04 | Disposition: A | Discharge status: Home / Self Care | Payer: BC Managed Care – PPO | Source: Ambulatory Visit | Attending: Pain Medicine | Admitting: Pain Medicine

## 2021-08-04 DIAGNOSIS — M5116 Intervertebral disc disorders with radiculopathy, lumbar region: Secondary | ICD-10-CM

## 2021-08-04 DIAGNOSIS — M2578 Osteophyte, vertebrae: Secondary | ICD-10-CM | POA: Diagnosis not present

## 2021-08-04 DIAGNOSIS — M4726 Other spondylosis with radiculopathy, lumbar region: Secondary | ICD-10-CM | POA: Diagnosis not present

## 2021-08-04 DIAGNOSIS — M48061 Spinal stenosis, lumbar region without neurogenic claudication: Secondary | ICD-10-CM | POA: Diagnosis not present

## 2021-08-23 DIAGNOSIS — K219 Gastro-esophageal reflux disease without esophagitis: Secondary | ICD-10-CM | POA: Diagnosis not present

## 2021-08-23 DIAGNOSIS — K5000 Crohn's disease of small intestine without complications: Secondary | ICD-10-CM | POA: Diagnosis not present

## 2021-08-23 DIAGNOSIS — R112 Nausea with vomiting, unspecified: Secondary | ICD-10-CM | POA: Diagnosis not present

## 2021-08-23 DIAGNOSIS — K626 Ulcer of anus and rectum: Secondary | ICD-10-CM | POA: Diagnosis not present

## 2021-09-01 DIAGNOSIS — Z79891 Long term (current) use of opiate analgesic: Secondary | ICD-10-CM | POA: Diagnosis not present

## 2021-09-01 DIAGNOSIS — M5116 Intervertebral disc disorders with radiculopathy, lumbar region: Secondary | ICD-10-CM | POA: Diagnosis not present

## 2021-09-03 DIAGNOSIS — S61210A Laceration without foreign body of right index finger without damage to nail, initial encounter: Secondary | ICD-10-CM | POA: Diagnosis not present

## 2021-09-03 DIAGNOSIS — W268XXA Contact with other sharp object(s), not elsewhere classified, initial encounter: Secondary | ICD-10-CM | POA: Diagnosis not present

## 2021-09-03 DIAGNOSIS — Z23 Encounter for immunization: Secondary | ICD-10-CM | POA: Diagnosis not present

## 2021-09-10 DIAGNOSIS — R1084 Generalized abdominal pain: Secondary | ICD-10-CM | POA: Diagnosis not present

## 2021-09-21 DIAGNOSIS — H43813 Vitreous degeneration, bilateral: Secondary | ICD-10-CM | POA: Diagnosis not present

## 2021-09-30 DIAGNOSIS — M5116 Intervertebral disc disorders with radiculopathy, lumbar region: Secondary | ICD-10-CM | POA: Diagnosis not present

## 2021-10-13 DIAGNOSIS — M5459 Other low back pain: Secondary | ICD-10-CM | POA: Diagnosis not present

## 2021-10-13 DIAGNOSIS — M25551 Pain in right hip: Secondary | ICD-10-CM | POA: Diagnosis not present

## 2021-10-13 DIAGNOSIS — M6281 Muscle weakness (generalized): Secondary | ICD-10-CM | POA: Diagnosis not present

## 2021-10-13 DIAGNOSIS — M25552 Pain in left hip: Secondary | ICD-10-CM | POA: Diagnosis not present

## 2021-10-18 DIAGNOSIS — M5416 Radiculopathy, lumbar region: Secondary | ICD-10-CM | POA: Diagnosis not present

## 2021-10-19 DIAGNOSIS — M25551 Pain in right hip: Secondary | ICD-10-CM | POA: Diagnosis not present

## 2021-10-19 DIAGNOSIS — M6281 Muscle weakness (generalized): Secondary | ICD-10-CM | POA: Diagnosis not present

## 2021-10-19 DIAGNOSIS — M5459 Other low back pain: Secondary | ICD-10-CM | POA: Diagnosis not present

## 2021-10-19 DIAGNOSIS — M25552 Pain in left hip: Secondary | ICD-10-CM | POA: Diagnosis not present

## 2021-11-01 ENCOUNTER — Other Ambulatory Visit: Payer: Self-pay | Admitting: Nurse Practitioner

## 2021-11-01 DIAGNOSIS — M5116 Intervertebral disc disorders with radiculopathy, lumbar region: Secondary | ICD-10-CM | POA: Diagnosis not present

## 2021-11-09 DIAGNOSIS — R1110 Vomiting, unspecified: Secondary | ICD-10-CM | POA: Diagnosis not present

## 2021-11-09 DIAGNOSIS — R110 Nausea: Secondary | ICD-10-CM | POA: Diagnosis not present

## 2021-11-09 DIAGNOSIS — R1084 Generalized abdominal pain: Secondary | ICD-10-CM | POA: Diagnosis not present

## 2021-11-24 DIAGNOSIS — R197 Diarrhea, unspecified: Secondary | ICD-10-CM | POA: Diagnosis not present

## 2021-11-24 DIAGNOSIS — K626 Ulcer of anus and rectum: Secondary | ICD-10-CM | POA: Diagnosis not present

## 2021-11-24 DIAGNOSIS — R112 Nausea with vomiting, unspecified: Secondary | ICD-10-CM | POA: Diagnosis not present

## 2021-11-24 DIAGNOSIS — L409 Psoriasis, unspecified: Secondary | ICD-10-CM | POA: Diagnosis not present

## 2021-11-28 ENCOUNTER — Ambulatory Visit
Admission: RE | Admit: 2021-11-28 | Discharge: 2021-11-28 | Disposition: A | Discharge status: Home / Self Care | Payer: Medicare Other | Source: Ambulatory Visit | Attending: Nurse Practitioner | Admitting: Nurse Practitioner

## 2021-11-28 DIAGNOSIS — M47816 Spondylosis without myelopathy or radiculopathy, lumbar region: Secondary | ICD-10-CM | POA: Diagnosis not present

## 2021-11-28 DIAGNOSIS — M545 Low back pain, unspecified: Secondary | ICD-10-CM | POA: Diagnosis not present

## 2021-11-28 DIAGNOSIS — M25551 Pain in right hip: Secondary | ICD-10-CM | POA: Diagnosis not present

## 2021-11-28 DIAGNOSIS — M5116 Intervertebral disc disorders with radiculopathy, lumbar region: Secondary | ICD-10-CM

## 2021-11-29 DIAGNOSIS — M1812 Unilateral primary osteoarthritis of first carpometacarpal joint, left hand: Secondary | ICD-10-CM | POA: Diagnosis not present

## 2021-12-29 DIAGNOSIS — E0590 Thyrotoxicosis, unspecified without thyrotoxic crisis or storm: Secondary | ICD-10-CM | POA: Diagnosis not present

## 2021-12-29 DIAGNOSIS — R112 Nausea with vomiting, unspecified: Secondary | ICD-10-CM | POA: Diagnosis not present

## 2021-12-29 DIAGNOSIS — K5080 Crohn's disease of both small and large intestine without complications: Secondary | ICD-10-CM | POA: Diagnosis not present

## 2022-01-07 ENCOUNTER — Telehealth: Payer: Self-pay | Admitting: Gastroenterology

## 2022-01-07 NOTE — Telephone Encounter (Signed)
Good afternoon Dr Silverio Decamp  We have received a referral for patient to be sen for vomiting. Patient was last with [facility].  Patient records are available for review in [facility]. Please review and advise on scheduling.  Thank you

## 2022-01-07 NOTE — Telephone Encounter (Signed)
Request received to transfer GI care from outside practice to [facility].  We appreciate the interest in our practice, however at this time we cannot accommodate this transfer.

## 2022-01-10 DIAGNOSIS — M1812 Unilateral primary osteoarthritis of first carpometacarpal joint, left hand: Secondary | ICD-10-CM | POA: Diagnosis not present

## 2022-01-11 NOTE — Telephone Encounter (Signed)
Patient advised.

## 2022-01-25 ENCOUNTER — Ambulatory Visit (INDEPENDENT_AMBULATORY_CARE_PROVIDER_SITE_OTHER): Payer: BC Managed Care – PPO | Admitting: Internal Medicine

## 2022-01-25 ENCOUNTER — Encounter: Payer: Self-pay | Admitting: Internal Medicine

## 2022-01-25 VITALS — BP 120/84 | HR 83 | Ht 67.0 in | Wt 174.0 lb

## 2022-01-25 DIAGNOSIS — E0590 Thyrotoxicosis, unspecified without thyrotoxic crisis or storm: Secondary | ICD-10-CM

## 2022-01-25 LAB — T4, FREE: Free T4: 0.84 ng/dL (ref 0.60–1.60)

## 2022-01-25 LAB — TSH: TSH: 0.64 u[IU]/mL (ref 0.35–5.50)

## 2022-01-25 NOTE — Progress Notes (Signed)
Name: [name]  MRN/ DOB: [id number], 10-21-1960    Age/ Sex: 61 y.o., female     PCP: Rochel Brome, MD   Reason for Endocrinology Evaluation: Hyperthyroidism     Initial Endocrinology Clinic Visit: 09/25/2013    PATIENT IDENTIFIER: [name] is a 61 y.o., female with a past medical history of Asthma and hyperthyroidism. She has followed with [facility] since 09/25/2013 for consultative assistance with management of her Hyperthyroidism.   HISTORICAL SUMMARY: The patient was first diagnosed with hyperthyroidism in 2010. She was on Methimazole until 05/2020 when she self discontinued but developed palpitations afterwards but TFT's have been normal  Aunts with thyroid disease    Adrenal HISTORY:  She had an abnormal cosyntropin stim test in 2018 with a 60 minute cortisol  of 4.6 ug/dL. She was prescribed hydrocortisone at the time , per pt  she was gradually weaned off through her GI specialist. She is currently on recurrent intra-articular injections    SUBJECTIVE:    Today (01/25/2022):  [name] is here for a follow up on hx of hyperthyroidism. She is accompanied by her spouse  Mitzi Hansen.    Weight has been fluctuating Denies palpitations  She  has chronic nausea and has Crohn's disease and has chronic diarrhea   She has chronic  tremors  Denies local neck swelling  She takes intra-articular injections for back, hands  through pain management.     HISTORY:  Past Medical History:  Past Medical History:  Diagnosis Date   Arthritis    Chronic diarrhea    Chronic disease anemia    Crohn's disease ([place])    Depression    Family history of malignant neoplasm of gastrointestinal tract    GERD (gastroesophageal reflux disease)    H/O hiatal hernia    History of adenomatous polyp of colon    History of gastritis    AND HX ILEITIS   History of kidney stones    History of small bowel obstruction    SECONDARY CROHN'S STRICTURE  S/P  COLECTOMY   Hyperlipidemia    Hypertension    Hypopotassemia    Internal hemorrhoids    Kidney stones    Pancreatic insufficiency    PONV (postoperative nausea and vomiting)    Psoriasis    SKIN   Right ureteral stone    Rotavirus infection 05/31/2013   S/P dilatation of esophageal stricture    Seasonal asthma    Urgency of urination    Vitamin B12 deficiency    Past Surgical History:  Past Surgical History:  Procedure Laterality Date   ANAL FISSURE REPAIR  1990's   CARPAL TUNNEL RELEASE Right 2000   CHOLECYSTECTOMY  2002   CYSTOSCOPY WITH RETROGRADE PYELOGRAM, URETEROSCOPY AND STENT PLACEMENT Left 03/08/2013   Procedure: CYSTOSCOPY WITH RETROGRADE PYELOGRAM, URETEROSCOPY AND STENT PLACEMENT stone basketing;  Surgeon: Alexis Frock, MD;  Location: [facility];  Service: Urology;  Laterality: Left;   CYSTOSCOPY WITH RETROGRADE PYELOGRAM, URETEROSCOPY AND STENT PLACEMENT Right 04/03/2013   Procedure: CYSTOSCOPY WITH RETROGRADE PYELOGRAM, URETEROSCOPY AND STENT PLACEMENT;  Surgeon: Alexis Frock, MD;  Location[facility];  Service: Urology;  Laterality: Right;   DX LAPAROSCOPY CONVERTED TO OPEN RIGHT COLECCTOMY  09-15-2010   ANASTOMOTIC STRICTURE   LAPAROSCOPIC ILEOCECECTOMY  10-09-2008   AND APPENDECTOMY (TERMINAL ILEITIS & PARTIAL SBO)   SPINAL CORD STIMULATOR IMPLANT Left 05/2017   VAGINAL HYSTERECTOMY  1992   Social History:  reports that she quit smoking  about 37 years ago. Her smoking use included cigarettes. She has a 10.00 pack-year smoking history. She has never used smokeless tobacco. She reports that she does not drink alcohol and does not use drugs. Family History:  Family History  Problem Relation Age of Onset   Colon cancer Mother    Colon polyps Mother    Colon polyps Father    Lung cancer Father    Breast cancer Paternal Grandmother    Colon polyps Brother    Thyroid disease Neg Hx    Stomach cancer Neg Hx    Pancreatic cancer Neg Hx      HOME  MEDICATIONS: Allergies as of 01/25/2022       Reactions   Codeine Hives, Nausea And Vomiting   Amitriptyline Itching, Hives   Humira [adalimumab] Rash        Medication List        Accurate as of January 25, 2022  1:26 PM. If you have any questions, ask your nurse or doctor.          STOP taking these medications    hydrocortisone 5 MG tablet Commonly known as: CORTEF Stopped by: Dorita Sciara, MD       TAKE these medications    albuterol 108 (90 Base) MCG/ACT inhaler Commonly known as: VENTOLIN HFA Inhale 2 puffs into the lungs every 4 (four) hours as needed for wheezing or shortness of breath.   colestipol 1 g tablet Commonly known as: COLESTID Take 1 tablet (1 g total) by mouth 3 (three) times daily.   cyanocobalamin 1000 MCG/ML injection Commonly known as: VITAMIN B12 Inject 30m SQ once every 7 days x 4 then inject 1 ml SQ every 28 days.   dronabinol 2.5 MG capsule Commonly known as: MARINOL Take 2.5 mg by mouth 2 (two) times daily.   econazole nitrate 1 % cream APPLY  CREAM TOPICALLY ONCE DAILY What changed: See the new instructions.   gabapentin 400 MG capsule Commonly known as: NEURONTIN Take 400-1,200 mg by mouth 3 (three) times daily. Take 1 capsule by mouth at breakfast, 2 at lunch and 3 at dinner   hyoscyamine 0.125 MG SL tablet Commonly known as: LEVSIN SL Place 0.125 mg under the tongue 4 (four) times daily.   levETIRAcetam 250 MG tablet Commonly known as: KEPPRA Take 250 mg by mouth 2 (two) times daily.   lipase/protease/amylase 36000 UNITS Cpep capsule Commonly known as: CREON Take 2  ( 72,000units) by mouth three times a day with meals one (36,000units) with snacks This is a dose increase for this -patient   multivitamin with minerals Tabs tablet Take 1 tablet by mouth daily.   Needles & Syringes Misc 1 Syringe by Does not apply route every 30 (thirty) days.   omeprazole 40 MG capsule Commonly known as: PRILOSEC Take  1 capsule (40 mg total) by mouth daily.   Oxycodone HCl 10 MG Tabs Take 10 mg by mouth 5 (five) times daily as needed (pain). 1 tablet up to five times a day as needed for severe pain.   Potassium Chloride ER 20 MEQ Tbcr Take 20 mEq by mouth 2 (two) times daily for 2 days.   pramoxine-hydrocortisone 1-1 % rectal cream Commonly known as: PROCTOCREAM-HC Place 1 application rectally 2 (two) times daily.   promethazine 25 MG suppository Commonly known as: Phenergan Place 1 suppository (25 mg total) rectally every 6 (six) hours as needed for nausea or vomiting.   promethazine 25 MG tablet Commonly known as:  PHENERGAN TAKE 1 TABLET BY MOUTH ONCE DAILY AS NEEDED FOR NAUSEA AND VOMITING   propranolol ER 80 MG 24 hr capsule Commonly known as: INDERAL LA Take 1 capsule by mouth daily.   traZODone 50 MG tablet Commonly known as: DESYREL Take 50 mg by mouth at bedtime as needed for sleep.   vedolizumab 300 MG injection Commonly known as: ENTYVIO Inject 300 mg into the vein every 8 (eight) weeks.   VITAMIN D PO Take 1 tablet by mouth daily.   zolmitriptan 5 MG nasal solution Commonly known as: Zomig 1 spray in one nostril.  May repeat 1 spray once after 2 hours if needed. Not to exceed 2 sprays in 24 hours What changed:  how much to take how to take this when to take this reasons to take this          OBJECTIVE:   PHYSICAL EXAM: VS: BP 120/84 (BP Location: Left Arm, Patient Position: Sitting, Cuff Size: Normal)   Pulse 83   Ht 5' 7"  (1.702 m)   Wt 174 lb (78.9 kg)   SpO2 96%   BMI 27.25 kg/m    EXAM: General: Pt appears well and is in NAD  Eyes: External eye exam normal without stare, lid lag or exophthalmos.  EOM intact.    Neck: General: Supple without adenopathy. Thyroid: Thyroid size normal.  No goiter or nodules appreciated. No thyroid bruit.  Lungs: Clear with good BS bilat with no rales, rhonchi, or wheezes  Heart: Auscultation: RRR.  Abdomen: Normoactive  bowel sounds, soft, nontender, without masses or organomegaly palpable  Extremities:  BL LE: No pretibial edema normal ROM and strength.  Mental Status: Judgment, insight: Intact Orientation: Oriented to time, place, and person Mood and affect: No depression, anxiety, or agitation     DATA REVIEWED:    Latest Reference Range & Units 01/25/22 09:16  TSH 0.35 - 5.50 uIU/mL 0.64  T4,Free(Direct) 0.60 - 1.60 ng/dL 0.84    ASSESSMENT / PLAN / RECOMMENDATIONS:   Hyperthyroidism:  -Patient is clinically euthyroid -She has been off methimazole therapy since March 2022, she does not need to be on any methimazole -TFTs today are normal      Recommend continuation of current Tx with primary MD and consultative f/u at [facility] in the future if pt's thyroid  control becomes problematic.   Signed electronically by: Mack Guise, MD  [facility]  [facility] [address] [place], [place] [address] Phone: [phone] FAX: [phone]      CC: Rochel Brome, MD [address] [place] [address] Phone: [phone]  Fax: [phone]   Return to Endocrinology clinic as below: No future appointments.

## 2022-01-25 NOTE — Patient Instructions (Signed)
If your thyroid function remains normal, will turn your care to your Primary care Physician for once a year thyroid lab work, will be happy to see you again if your thyroid function becomes abnormal

## 2022-02-07 DIAGNOSIS — M545 Low back pain, unspecified: Secondary | ICD-10-CM | POA: Diagnosis not present

## 2022-02-07 DIAGNOSIS — M1812 Unilateral primary osteoarthritis of first carpometacarpal joint, left hand: Secondary | ICD-10-CM | POA: Diagnosis not present

## 2022-03-03 DIAGNOSIS — D84821 Immunodeficiency due to drugs: Secondary | ICD-10-CM | POA: Diagnosis not present

## 2022-03-03 DIAGNOSIS — R7982 Elevated C-reactive protein (CRP): Secondary | ICD-10-CM | POA: Diagnosis not present

## 2022-03-03 DIAGNOSIS — R112 Nausea with vomiting, unspecified: Secondary | ICD-10-CM | POA: Diagnosis not present

## 2022-03-03 DIAGNOSIS — K219 Gastro-esophageal reflux disease without esophagitis: Secondary | ICD-10-CM | POA: Diagnosis not present

## 2022-03-03 DIAGNOSIS — K580 Irritable bowel syndrome with diarrhea: Secondary | ICD-10-CM | POA: Diagnosis not present

## 2022-03-03 DIAGNOSIS — L409 Psoriasis, unspecified: Secondary | ICD-10-CM | POA: Diagnosis not present

## 2022-03-08 DIAGNOSIS — M25511 Pain in right shoulder: Secondary | ICD-10-CM | POA: Diagnosis not present

## 2022-03-08 DIAGNOSIS — Z79891 Long term (current) use of opiate analgesic: Secondary | ICD-10-CM | POA: Diagnosis not present

## 2022-04-06 DIAGNOSIS — M47816 Spondylosis without myelopathy or radiculopathy, lumbar region: Secondary | ICD-10-CM | POA: Diagnosis not present

## 2022-04-21 DIAGNOSIS — B370 Candidal stomatitis: Secondary | ICD-10-CM | POA: Diagnosis not present

## 2022-04-21 DIAGNOSIS — H1033 Unspecified acute conjunctivitis, bilateral: Secondary | ICD-10-CM | POA: Diagnosis not present

## 2022-04-28 DIAGNOSIS — M47816 Spondylosis without myelopathy or radiculopathy, lumbar region: Secondary | ICD-10-CM | POA: Diagnosis not present

## 2022-05-05 DIAGNOSIS — M47816 Spondylosis without myelopathy or radiculopathy, lumbar region: Secondary | ICD-10-CM | POA: Diagnosis not present

## 2022-05-10 DIAGNOSIS — K130 Diseases of lips: Secondary | ICD-10-CM | POA: Diagnosis not present

## 2022-05-27 DIAGNOSIS — Z79899 Other long term (current) drug therapy: Secondary | ICD-10-CM | POA: Diagnosis not present

## 2022-05-27 DIAGNOSIS — L409 Psoriasis, unspecified: Secondary | ICD-10-CM | POA: Diagnosis not present

## 2022-05-27 DIAGNOSIS — D84821 Immunodeficiency due to drugs: Secondary | ICD-10-CM | POA: Diagnosis not present

## 2022-05-27 DIAGNOSIS — K5000 Crohn's disease of small intestine without complications: Secondary | ICD-10-CM | POA: Diagnosis not present

## 2022-05-27 DIAGNOSIS — R112 Nausea with vomiting, unspecified: Secondary | ICD-10-CM | POA: Diagnosis not present

## 2022-06-07 DIAGNOSIS — M1812 Unilateral primary osteoarthritis of first carpometacarpal joint, left hand: Secondary | ICD-10-CM | POA: Diagnosis not present

## 2022-06-08 ENCOUNTER — Ambulatory Visit
Admission: RE | Admit: 2022-06-08 | Discharge: 2022-06-08 | Disposition: A | Discharge status: Home / Self Care | Payer: Medicare Other | Source: Ambulatory Visit | Attending: Nurse Practitioner | Admitting: Nurse Practitioner

## 2022-06-08 ENCOUNTER — Other Ambulatory Visit: Payer: Self-pay | Admitting: Nurse Practitioner

## 2022-06-08 DIAGNOSIS — M25561 Pain in right knee: Secondary | ICD-10-CM

## 2022-06-17 DIAGNOSIS — R109 Unspecified abdominal pain: Secondary | ICD-10-CM | POA: Diagnosis not present

## 2022-06-17 DIAGNOSIS — M5417 Radiculopathy, lumbosacral region: Secondary | ICD-10-CM | POA: Diagnosis not present

## 2022-06-17 DIAGNOSIS — G8929 Other chronic pain: Secondary | ICD-10-CM | POA: Diagnosis not present

## 2022-06-17 DIAGNOSIS — M533 Sacrococcygeal disorders, not elsewhere classified: Secondary | ICD-10-CM | POA: Diagnosis not present

## 2022-06-17 DIAGNOSIS — M5136 Other intervertebral disc degeneration, lumbar region: Secondary | ICD-10-CM | POA: Diagnosis not present

## 2022-06-17 DIAGNOSIS — Z79899 Other long term (current) drug therapy: Secondary | ICD-10-CM | POA: Diagnosis not present

## 2022-06-17 DIAGNOSIS — Z5181 Encounter for therapeutic drug level monitoring: Secondary | ICD-10-CM | POA: Diagnosis not present

## 2022-06-21 DIAGNOSIS — M5417 Radiculopathy, lumbosacral region: Secondary | ICD-10-CM | POA: Diagnosis not present

## 2022-06-21 DIAGNOSIS — R109 Unspecified abdominal pain: Secondary | ICD-10-CM | POA: Diagnosis not present

## 2022-06-21 DIAGNOSIS — M533 Sacrococcygeal disorders, not elsewhere classified: Secondary | ICD-10-CM | POA: Diagnosis not present

## 2022-06-21 DIAGNOSIS — G8929 Other chronic pain: Secondary | ICD-10-CM | POA: Diagnosis not present

## 2022-06-21 DIAGNOSIS — M5136 Other intervertebral disc degeneration, lumbar region: Secondary | ICD-10-CM | POA: Diagnosis not present

## 2022-06-24 DIAGNOSIS — K219 Gastro-esophageal reflux disease without esophagitis: Secondary | ICD-10-CM | POA: Diagnosis not present

## 2022-06-24 DIAGNOSIS — R1013 Epigastric pain: Secondary | ICD-10-CM | POA: Diagnosis not present

## 2022-06-24 DIAGNOSIS — R112 Nausea with vomiting, unspecified: Secondary | ICD-10-CM | POA: Diagnosis not present

## 2022-06-24 DIAGNOSIS — K921 Melena: Secondary | ICD-10-CM | POA: Diagnosis not present

## 2022-06-24 DIAGNOSIS — K602 Anal fissure, unspecified: Secondary | ICD-10-CM | POA: Diagnosis not present

## 2022-06-24 DIAGNOSIS — K3189 Other diseases of stomach and duodenum: Secondary | ICD-10-CM | POA: Diagnosis not present

## 2022-06-29 DIAGNOSIS — R7989 Other specified abnormal findings of blood chemistry: Secondary | ICD-10-CM | POA: Diagnosis not present

## 2022-06-29 DIAGNOSIS — B354 Tinea corporis: Secondary | ICD-10-CM | POA: Diagnosis not present

## 2022-06-29 DIAGNOSIS — R413 Other amnesia: Secondary | ICD-10-CM | POA: Diagnosis not present

## 2022-06-29 DIAGNOSIS — R1030 Lower abdominal pain, unspecified: Secondary | ICD-10-CM | POA: Diagnosis not present

## 2022-07-14 DIAGNOSIS — M5136 Other intervertebral disc degeneration, lumbar region: Secondary | ICD-10-CM | POA: Diagnosis not present

## 2022-07-14 DIAGNOSIS — Z5181 Encounter for therapeutic drug level monitoring: Secondary | ICD-10-CM | POA: Diagnosis not present

## 2022-07-14 DIAGNOSIS — M533 Sacrococcygeal disorders, not elsewhere classified: Secondary | ICD-10-CM | POA: Diagnosis not present

## 2022-07-14 DIAGNOSIS — G8929 Other chronic pain: Secondary | ICD-10-CM | POA: Diagnosis not present

## 2022-07-14 DIAGNOSIS — R109 Unspecified abdominal pain: Secondary | ICD-10-CM | POA: Diagnosis not present

## 2022-07-14 DIAGNOSIS — M5417 Radiculopathy, lumbosacral region: Secondary | ICD-10-CM | POA: Diagnosis not present

## 2022-08-02 DIAGNOSIS — G8929 Other chronic pain: Secondary | ICD-10-CM | POA: Diagnosis not present

## 2022-08-02 DIAGNOSIS — K5090 Crohn's disease, unspecified, without complications: Secondary | ICD-10-CM | POA: Diagnosis not present

## 2022-08-02 DIAGNOSIS — Z86010 Personal history of colonic polyps: Secondary | ICD-10-CM | POA: Diagnosis not present

## 2022-08-02 DIAGNOSIS — R1084 Generalized abdominal pain: Secondary | ICD-10-CM | POA: Diagnosis not present

## 2022-08-02 DIAGNOSIS — Z885 Allergy status to narcotic agent status: Secondary | ICD-10-CM | POA: Diagnosis not present

## 2022-08-02 DIAGNOSIS — K219 Gastro-esophageal reflux disease without esophagitis: Secondary | ICD-10-CM | POA: Diagnosis not present

## 2022-08-02 DIAGNOSIS — Z888 Allergy status to other drugs, medicaments and biological substances status: Secondary | ICD-10-CM | POA: Diagnosis not present

## 2022-08-02 DIAGNOSIS — Z87891 Personal history of nicotine dependence: Secondary | ICD-10-CM | POA: Diagnosis not present

## 2022-09-01 DIAGNOSIS — R112 Nausea with vomiting, unspecified: Secondary | ICD-10-CM | POA: Diagnosis not present

## 2022-09-01 DIAGNOSIS — L409 Psoriasis, unspecified: Secondary | ICD-10-CM | POA: Diagnosis not present

## 2022-09-01 DIAGNOSIS — K591 Functional diarrhea: Secondary | ICD-10-CM | POA: Diagnosis not present

## 2022-09-01 DIAGNOSIS — K5000 Crohn's disease of small intestine without complications: Secondary | ICD-10-CM | POA: Diagnosis not present

## 2022-09-01 DIAGNOSIS — E538 Deficiency of other specified B group vitamins: Secondary | ICD-10-CM | POA: Diagnosis not present

## 2022-09-14 DIAGNOSIS — R109 Unspecified abdominal pain: Secondary | ICD-10-CM | POA: Diagnosis not present

## 2022-09-14 DIAGNOSIS — M5136 Other intervertebral disc degeneration, lumbar region: Secondary | ICD-10-CM | POA: Diagnosis not present

## 2022-10-18 DIAGNOSIS — E271 Primary adrenocortical insufficiency: Secondary | ICD-10-CM | POA: Diagnosis not present

## 2022-10-18 DIAGNOSIS — Z87891 Personal history of nicotine dependence: Secondary | ICD-10-CM | POA: Diagnosis not present

## 2022-10-18 DIAGNOSIS — Z888 Allergy status to other drugs, medicaments and biological substances status: Secondary | ICD-10-CM | POA: Diagnosis not present

## 2022-10-18 DIAGNOSIS — G8929 Other chronic pain: Secondary | ICD-10-CM | POA: Diagnosis not present

## 2022-10-18 DIAGNOSIS — Z972 Presence of dental prosthetic device (complete) (partial): Secondary | ICD-10-CM | POA: Diagnosis not present

## 2022-10-18 DIAGNOSIS — R1084 Generalized abdominal pain: Secondary | ICD-10-CM | POA: Diagnosis not present

## 2022-10-18 DIAGNOSIS — J45909 Unspecified asthma, uncomplicated: Secondary | ICD-10-CM | POA: Diagnosis not present

## 2022-10-18 DIAGNOSIS — Z885 Allergy status to narcotic agent status: Secondary | ICD-10-CM | POA: Diagnosis not present

## 2022-10-18 DIAGNOSIS — K219 Gastro-esophageal reflux disease without esophagitis: Secondary | ICD-10-CM | POA: Diagnosis not present

## 2022-10-18 DIAGNOSIS — K5000 Crohn's disease of small intestine without complications: Secondary | ICD-10-CM | POA: Diagnosis not present

## 2022-11-09 DIAGNOSIS — Z79899 Other long term (current) drug therapy: Secondary | ICD-10-CM | POA: Diagnosis not present

## 2022-11-09 DIAGNOSIS — R109 Unspecified abdominal pain: Secondary | ICD-10-CM | POA: Diagnosis not present

## 2022-11-09 DIAGNOSIS — M5136 Other intervertebral disc degeneration, lumbar region: Secondary | ICD-10-CM | POA: Diagnosis not present

## 2022-11-09 DIAGNOSIS — M5417 Radiculopathy, lumbosacral region: Secondary | ICD-10-CM | POA: Diagnosis not present

## 2022-11-09 DIAGNOSIS — Z5181 Encounter for therapeutic drug level monitoring: Secondary | ICD-10-CM | POA: Diagnosis not present

## 2022-11-09 DIAGNOSIS — G894 Chronic pain syndrome: Secondary | ICD-10-CM | POA: Diagnosis not present

## 2022-11-28 DIAGNOSIS — E538 Deficiency of other specified B group vitamins: Secondary | ICD-10-CM | POA: Diagnosis not present

## 2022-11-28 DIAGNOSIS — R112 Nausea with vomiting, unspecified: Secondary | ICD-10-CM | POA: Diagnosis not present

## 2022-11-28 DIAGNOSIS — L409 Psoriasis, unspecified: Secondary | ICD-10-CM | POA: Diagnosis not present

## 2022-11-28 DIAGNOSIS — K5000 Crohn's disease of small intestine without complications: Secondary | ICD-10-CM | POA: Diagnosis not present

## 2022-11-30 DIAGNOSIS — G894 Chronic pain syndrome: Secondary | ICD-10-CM | POA: Diagnosis not present

## 2022-11-30 DIAGNOSIS — G8929 Other chronic pain: Secondary | ICD-10-CM | POA: Diagnosis not present

## 2022-11-30 DIAGNOSIS — R102 Pelvic and perineal pain: Secondary | ICD-10-CM | POA: Diagnosis not present

## 2022-11-30 DIAGNOSIS — R109 Unspecified abdominal pain: Secondary | ICD-10-CM | POA: Diagnosis not present

## 2022-12-07 DIAGNOSIS — G8929 Other chronic pain: Secondary | ICD-10-CM | POA: Diagnosis not present

## 2022-12-07 DIAGNOSIS — R1084 Generalized abdominal pain: Secondary | ICD-10-CM | POA: Diagnosis not present

## 2022-12-07 DIAGNOSIS — R109 Unspecified abdominal pain: Secondary | ICD-10-CM | POA: Diagnosis not present

## 2023-01-04 DIAGNOSIS — G894 Chronic pain syndrome: Secondary | ICD-10-CM | POA: Diagnosis not present

## 2023-01-04 DIAGNOSIS — M5417 Radiculopathy, lumbosacral region: Secondary | ICD-10-CM | POA: Diagnosis not present

## 2023-01-04 DIAGNOSIS — R109 Unspecified abdominal pain: Secondary | ICD-10-CM | POA: Diagnosis not present

## 2023-01-19 DIAGNOSIS — M533 Sacrococcygeal disorders, not elsewhere classified: Secondary | ICD-10-CM | POA: Diagnosis not present

## 2023-02-07 DIAGNOSIS — K529 Noninfective gastroenteritis and colitis, unspecified: Secondary | ICD-10-CM | POA: Diagnosis not present

## 2023-04-18 ENCOUNTER — Encounter (HOSPITAL_BASED_OUTPATIENT_CLINIC_OR_DEPARTMENT_OTHER): Payer: Self-pay

## 2023-04-18 ENCOUNTER — Other Ambulatory Visit (HOSPITAL_BASED_OUTPATIENT_CLINIC_OR_DEPARTMENT_OTHER): Payer: Self-pay

## 2023-04-18 ENCOUNTER — Ambulatory Visit (HOSPITAL_BASED_OUTPATIENT_CLINIC_OR_DEPARTMENT_OTHER)
Admission: RE | Admit: 2023-04-18 | Discharge: 2023-04-18 | Disposition: A | Discharge status: Home / Self Care | Payer: Medicare Other | Source: Ambulatory Visit

## 2023-04-18 VITALS — BP 150/88 | HR 70 | Temp 97.8°F | Resp 20

## 2023-04-18 DIAGNOSIS — K50119 Crohn's disease of large intestine with unspecified complications: Secondary | ICD-10-CM

## 2023-04-18 DIAGNOSIS — K2970 Gastritis, unspecified, without bleeding: Secondary | ICD-10-CM

## 2023-04-18 DIAGNOSIS — K2990 Gastroduodenitis, unspecified, without bleeding: Secondary | ICD-10-CM

## 2023-04-18 LAB — POCT URINALYSIS DIP (MANUAL ENTRY)
Bilirubin, UA: NEGATIVE
Glucose, UA: NEGATIVE mg/dL
Ketones, POC UA: NEGATIVE mg/dL
Leukocytes, UA: NEGATIVE
Nitrite, UA: NEGATIVE
Protein Ur, POC: 30 mg/dL — AB
Spec Grav, UA: >=1.03 — AB
Urobilinogen, UA: 0.2 U/dL
pH, UA: 6

## 2023-04-18 MED ORDER — ONDANSETRON HCL 4 MG/2ML IJ SOLN
4.0000 mg | Freq: Once | INTRAMUSCULAR | Status: AC
  Administered 2023-04-18: 4 mg via INTRAMUSCULAR

## 2023-04-18 MED ORDER — PROMETHAZINE HCL 25 MG PO TABS
25.0000 mg | ORAL_TABLET | Freq: Four times a day (QID) | ORAL | 0 refills | Status: AC | PRN
Start: 2023-04-18 — End: ?
  Filled 2023-04-18: qty 30, 8d supply, fill #0

## 2023-04-18 NOTE — Discharge Instructions (Addendum)
As discussed if your symptoms do not improve or if at any point they worsen go immediately to the emergency department for further workup and evaluation of your current Crohn's flare.  Advance diet as tolerated.  Force fluids.  I have refilled Phenergan.  Keep follow-up with GI specialist.

## 2023-04-18 NOTE — ED Triage Notes (Signed)
Patient has Crohn's Disease. Abdominal pain, Vomiting and diarrhea are part of her exacerbations. This episode x 2 weeks. Patient usually needs "a shot of phenergan with some tablets to use at home help me best."  Patient states took last phenergan already.

## 2023-04-18 NOTE — ED Provider Notes (Signed)
[facility]    CSN: [id number] Arrival date & time: 04/18/23  1328      History   Chief Complaint Chief Complaint  Patient presents with   Abdominal Pain    Nausea and vomiting and diarrhea - Entered by patient   Emesis   Diarrhea    HPI [name] is a 63 y.o. female.   Patient with a history Crohn's disease, chronic diarrhea, chronic nausea and vomiting, presents today with a two week history of intermittent lower abdominal cramping. Patient has had bowel resection and is currently not prescribed immunosuppressant therapy for Crohn's disease. She is followed by [facility] in [facility] for gastroenterology care and has reached out to there office regarding  his symptoms and they advised her to keep follow-up scheduled for next month. Patient reports she has been able to eat and drink with taking prescribed phenergan however, she took her last dose of phenergan this morning and has been nausea and unable to tolerate food and only sips of Sprite. She is accompanied by her spouse who reports that he has been keeping a close eye on her and the main problem is her nausea.  Patient concurs that her symptoms have been way worst in the past. She denies hemoptysis or bloody diarrhea.   Past Medical History:  Diagnosis Date   Arthritis    Chronic diarrhea    Chronic disease anemia    Crohn's disease (HCC)    Depression    Family history of malignant neoplasm of gastrointestinal tract    GERD (gastroesophageal reflux disease)    H/O hiatal hernia    History of adenomatous polyp of colon    History of gastritis    AND HX ILEITIS   History of kidney stones    History of small bowel obstruction    SECONDARY CROHN'S STRICTURE  S/P COLECTOMY   Hyperlipidemia    Hypertension    Hypopotassemia    Internal hemorrhoids    Kidney stones    Pancreatic insufficiency    PONV (postoperative nausea and vomiting)    Psoriasis    SKIN   Right ureteral stone    Rotavirus  infection 05/31/2013   S/P dilatation of esophageal stricture    Seasonal asthma    Urgency of urination    Vitamin B12 deficiency     Patient Active Problem List   Diagnosis Date Noted   Palpitations 11/24/2020   Hyperthyroidism 09/08/2020   Gastroenteritis 06/11/2018   Acute dehydration 06/10/2018   Adrenal insufficiency (Addison's disease) (HCC) 05/02/2018   Chronic back pain 05/02/2018   Seizure (HCC) 05/02/2018   Bile salt-induced diarrhea    Nausea and vomiting 05/17/2017   Dehydration    Urinary tract infection without hematuria 05/16/2017   Abnormal x-ray of abdomen    CC (Crohn's colitis) (HCC) 03/03/2017   Malnutrition of moderate degree 03/02/2017   Crohn's colitis (HCC) 03/01/2017   Weight loss 03/01/2017   Crohn's disease (regional enteritis) (HCC) 07/19/2016   Ileus (HCC)    Small bowel obstruction (HCC)    SBO (small bowel obstruction) (HCC)    Hyperglycemia 05/25/2016   Encounter for imaging study to confirm nasogastric (NG) tube placement    N&V (nausea and vomiting) 05/24/2016   Pancreatic insufficiency    Viral gastroenteritis 02/20/2016   Hypokalemia 02/20/2016   Hypomagnesemia 02/20/2016   Colitis    Rotavirus infection 05/31/2013   Protein-calorie malnutrition, severe (HCC) 05/30/2013   Crohn's disease involving terminal ileum (HCC) 05/28/2013  Skin lesion 12/14/2012   Weight increase 08/31/2010   Wears dentures 08/31/2010   Nausea & vomiting  08/31/2010   IBS (irritable bowel syndrome) 08/31/2010   Hiatal hernia 08/31/2010   Reflux 08/31/2010   Abdominal pain 08/31/2010   Rectal bleeding 08/31/2010   Hemorrhoids 08/31/2010   Sinus problem/runny nose 08/31/2010   Wears glasses 08/31/2010   Asthma 08/30/2010   COUGH 04/02/2010   Crohn's disease (HCC) 02/03/2009   DIARRHEA 02/03/2009   VITAMIN B12 DEFICIENCY 12/17/2008   SBO 09/12/2008   PSORIASIS 11/07/2007   DEPRESSION 08/31/2007   HEARING LOSS 08/31/2007   GERD (gastroesophageal  reflux disease) 08/31/2007   HIATAL HERNIA 08/31/2007   History of colonic polyps 08/31/2007    Past Surgical History:  Procedure Laterality Date   ANAL FISSURE REPAIR  1990's   CARPAL TUNNEL RELEASE Right 2000   CHOLECYSTECTOMY  2002   CYSTOSCOPY WITH RETROGRADE PYELOGRAM, URETEROSCOPY AND STENT PLACEMENT Left 03/08/2013   Procedure: CYSTOSCOPY WITH RETROGRADE PYELOGRAM, URETEROSCOPY AND STENT PLACEMENT stone basketing;  Surgeon: Sebastian Ache, MD;  Location: [facility];  Service: Urology;  Laterality: Left;   CYSTOSCOPY WITH RETROGRADE PYELOGRAM, URETEROSCOPY AND STENT PLACEMENT Right 04/03/2013   Procedure: CYSTOSCOPY WITH RETROGRADE PYELOGRAM, URETEROSCOPY AND STENT PLACEMENT;  Surgeon: Sebastian Ache, MD;  Location: [facility];  Service: Urology;  Laterality: Right;   DX LAPAROSCOPY CONVERTED TO OPEN RIGHT COLECCTOMY  09-15-2010   ANASTOMOTIC STRICTURE   LAPAROSCOPIC ILEOCECECTOMY  10-09-2008   AND APPENDECTOMY (TERMINAL ILEITIS & PARTIAL SBO)   SPINAL CORD STIMULATOR IMPLANT Left 05/2017   VAGINAL HYSTERECTOMY  1992    OB History   No obstetric history on file.      Home Medications    Prior to Admission medications   Medication Sig Start Date End Date Taking? Authorizing Provider  Buprenorphine HCl (BELBUCA) 300 MCG FILM Place 300 mcg inside cheek in the morning and at bedtime.   Yes [provider]  albuterol (VENTOLIN HFA) 108 (90 Base) MCG/ACT inhaler Inhale 2 puffs into the lungs every 4 (four) hours as needed for wheezing or shortness of breath.     [provider]  colestipol (COLESTID) 1 g tablet Take 1 tablet (1 g total) by mouth 3 (three) times daily. 07/07/17   Napoleon Form, MD  cyanocobalamin (,VITAMIN B-12,) 1000 MCG/ML injection Inject 1ml SQ once every 7 days x 4 then inject 1 ml SQ every 28 days. 12/24/18   Napoleon Form, MD  dronabinol (MARINOL) 2.5 MG capsule Take 2.5 mg by mouth 2 (two) times daily. 04/03/18    [provider]  econazole nitrate 1 % cream APPLY  CREAM TOPICALLY ONCE DAILY Patient taking differently: Apply 1 application topically daily. 10/25/16   Napoleon Form, MD  gabapentin (NEURONTIN) 400 MG capsule Take 400-1,200 mg by mouth 3 (three) times daily. Take 1 capsule by mouth at breakfast, 2 at lunch and 3 at dinner 08/27/16   [provider]  hyoscyamine (LEVSIN SL) 0.125 MG SL tablet Place 0.125 mg under the tongue 4 (four) times daily.    [provider]  levETIRAcetam (KEPPRA) 250 MG tablet Take 250 mg by mouth 2 (two) times daily. 02/12/16   [provider]  lipase/protease/amylase (CREON) 36000 UNITS CPEP capsule Take 2  ( 72,000units) by mouth three times a day with meals one (36,000units) with snacks This is a dose increase for this -patient 07/12/18   Napoleon Form, MD  Multiple Vitamin (MULTIVITAMIN WITH MINERALS)  TABS tablet Take 1 tablet by mouth daily. 06/14/18   Barnetta Chapel, MD  Needles & Syringes MISC 1 Syringe by Does not apply route every 30 (thirty) days. 09/26/17   Napoleon Form, MD  omeprazole (PRILOSEC) 40 MG capsule Take 1 capsule (40 mg total) by mouth daily. 09/20/17   Napoleon Form, MD  Oxycodone HCl 10 MG TABS Take 10 mg by mouth 5 (five) times daily as needed (pain). 1 tablet up to five times a day as needed for severe pain.     [provider]  Potassium Chloride ER 20 MEQ TBCR Take 20 mEq by mouth 2 (two) times daily for 2 days. 07/19/18 07/21/18  Napoleon Form, MD  pramoxine-hydrocortisone (PROCTOCREAM-HC) 1-1 % rectal cream Place 1 application rectally 2 (two) times daily. 07/07/17   Napoleon Form, MD  promethazine (PHENERGAN) 25 MG tablet Take 1 tablet (25 mg total) by mouth every 6 (six) hours as needed for nausea or vomiting. *pt needs to call office for appt* 04/18/23   Bing Neighbors, NP  propranolol ER (INDERAL LA) 80 MG 24 hr capsule Take 1 capsule by mouth daily. 08/22/16    [provider]  traZODone (DESYREL) 50 MG tablet Take 50 mg by mouth at bedtime as needed for sleep.  03/14/17   [provider]  vedolizumab (ENTYVIO) 300 MG injection Inject 300 mg into the vein every 8 (eight) weeks.    [provider]  VITAMIN D PO Take 1 tablet by mouth daily.    [provider]  zolmitriptan (ZOMIG) 5 MG nasal solution 1 spray in one nostril.  May repeat 1 spray once after 2 hours if needed. Not to exceed 2 sprays in 24 hours Patient taking differently: Place 1 spray into the nose daily as needed for migraine. 1 spray in one nostril.  May repeat 1 spray once after 2 hours if needed. Not to exceed 2 sprays in 24 hours 04/11/16   Drema Dallas, DO    Family History Family History  Problem Relation Age of Onset   Colon cancer Mother    Colon polyps Mother    Colon polyps Father    Lung cancer Father    Breast cancer Paternal Grandmother    Colon polyps Brother    Thyroid disease Neg Hx    Stomach cancer Neg Hx    Pancreatic cancer Neg Hx     Social History Social History   Tobacco Use   Smoking status: Former    Current packs/day: 0.00    Average packs/day: 1 pack/day for 10.0 years (10.0 ttl pk-yrs)    Types: Cigarettes    Start date: 03/07/1974    Quit date: 03/07/1984    Years since quitting: 39.1   Smokeless tobacco: Never  Vaping Use   Vaping status: Never Used  Substance Use Topics   Alcohol use: No    Alcohol/week: 0.0 standard drinks of alcohol   Drug use: No     Allergies   Codeine, Amitriptyline, and Humira [adalimumab]   Review of Systems Review of Systems  Gastrointestinal:  Positive for abdominal pain, diarrhea and vomiting.     Physical Exam Triage Vital Signs ED Triage Vitals  Encounter Vitals Group     BP 04/18/23 1338 (!) 150/88     Systolic BP Percentile --      Diastolic BP Percentile --      Pulse Rate 04/18/23 1338 70     Resp 04/18/23  1338 20     Temp 04/18/23 1338 97.8 F (36.6 C)      Temp Source 04/18/23 1338 Oral     SpO2 04/18/23 1338 97 %     Weight --      Height --      Head Circumference --      Peak Flow --      Pain Score 04/18/23 1340 5     Pain Loc --      Pain Education --      Exclude from Growth Chart --    No data found.  Updated Vital Signs BP (!) 150/88 (BP Location: Right Arm)   Pulse 70   Temp 97.8 F (36.6 C) (Oral)   Resp 20   SpO2 97%   Visual Acuity Right Eye Distance:   Left Eye Distance:   Bilateral Distance:    Right Eye Near:   Left Eye Near:    Bilateral Near:     Physical Exam Constitutional:      General: She is not in acute distress.    Appearance: She is underweight. She is ill-appearing. She is not toxic-appearing.  HENT:     Head: Normocephalic and atraumatic.  Eyes:     Extraocular Movements: Extraocular movements intact.     Pupils: Pupils are equal, round, and reactive to light.  Cardiovascular:     Rate and Rhythm: Normal rate and regular rhythm.  Pulmonary:     Effort: Pulmonary effort is normal.     Breath sounds: Normal breath sounds.  Abdominal:     General: Bowel sounds are decreased.     Tenderness: There is no abdominal tenderness.  Musculoskeletal:     Cervical back: Normal range of motion and neck supple.  Skin:    General: Skin is warm and dry.     Capillary Refill: Capillary refill takes less than 2 seconds.  Neurological:     General: No focal deficit present.     Mental Status: She is alert.      UC Treatments / Results  Labs (all labs ordered are listed, but only abnormal results are displayed) Labs Reviewed  POCT URINALYSIS DIP (MANUAL ENTRY) - Abnormal; Notable for the following components:      Result Value   Color, UA straw (*)    Clarity, UA hazy (*)    Spec Grav, UA >=1.030 (*)    Blood, UA small (*)    Protein Ur, POC =30 (*)    All other components within normal limits     EKG   Radiology No results found.  Procedures Procedures (including critical care  time)  Medications Ordered in UC Medications  ondansetron (ZOFRAN) injection 4 mg (4 mg Intramuscular Given 04/18/23 1412)    Initial Impression / Assessment and Plan / UC Course  I have reviewed the triage vital signs and the nursing notes.  Pertinent labs & imaging results that were available during my care of the patient were reviewed by me and considered in my medical decision making (see chart for details).    Crohns flare, agreed to refill Phenergan prescription. Advised if unable to tolerate foods  or fluids within 24 hours or if any moderate to severe abdominal pain develops go immediately to the ED. Force fluids UA elevated SPG signicant for mild volume depletion secondary to nausea, Keep follow-up with GI doctor. IM Zofran given here in clinic for nausea. Patient is stable, ambulatory, nontoxic appearing. Discharged home with ED precautions.  Final Clinical Impressions(s) / UC Diagnoses   Final diagnoses:  Gastritis and gastroduodenitis  Crohn's disease of colon with complication [facility])     Discharge Instructions      As discussed if your symptoms do not improve or if at any point they worsen go immediately to the emergency department for further workup and evaluation of your current Crohn's flare.  Advance diet as tolerated.  Force fluids.  I have refilled Phenergan.  Keep follow-up with GI specialist.    ED Prescriptions     Medication Sig Dispense Auth. Provider   promethazine (PHENERGAN) 25 MG tablet Take 1 tablet (25 mg total) by mouth every 6 (six) hours as needed for nausea or vomiting. *pt needs to call office for appt* 30 tablet Bing Neighbors, NP      PDMP not reviewed this encounter.   Bing Neighbors, NP 04/21/23 [phone]

## 2023-07-19 ENCOUNTER — Ambulatory Visit (HOSPITAL_BASED_OUTPATIENT_CLINIC_OR_DEPARTMENT_OTHER): Payer: Self-pay
